# Patient Record
Sex: Female | Born: 1965 | Race: Black or African American | Hispanic: No | Marital: Married | Smoking: Current every day smoker
Health system: Southern US, Community
[De-identification: ages and names within clinical notes are randomized; demographics above are authoritative.]

## PROBLEM LIST (undated history)

## (undated) DIAGNOSIS — G4730 Sleep apnea, unspecified: Secondary | ICD-10-CM

## (undated) DIAGNOSIS — B029 Zoster without complications: Secondary | ICD-10-CM

## (undated) DIAGNOSIS — K219 Gastro-esophageal reflux disease without esophagitis: Secondary | ICD-10-CM

## (undated) DIAGNOSIS — E119 Type 2 diabetes mellitus without complications: Secondary | ICD-10-CM

## (undated) DIAGNOSIS — M1990 Unspecified osteoarthritis, unspecified site: Secondary | ICD-10-CM

## (undated) DIAGNOSIS — E039 Hypothyroidism, unspecified: Secondary | ICD-10-CM

## (undated) DIAGNOSIS — I10 Essential (primary) hypertension: Secondary | ICD-10-CM

## (undated) DIAGNOSIS — Z8719 Personal history of other diseases of the digestive system: Secondary | ICD-10-CM

## (undated) DIAGNOSIS — E785 Hyperlipidemia, unspecified: Secondary | ICD-10-CM

## (undated) DIAGNOSIS — G510 Bell's palsy: Secondary | ICD-10-CM

## (undated) DIAGNOSIS — R0602 Shortness of breath: Secondary | ICD-10-CM

## (undated) HISTORY — DX: Hyperlipidemia, unspecified: E78.5

## (undated) HISTORY — DX: Gastro-esophageal reflux disease without esophagitis: K21.9

## (undated) HISTORY — PX: ABDOMINAL HYSTERECTOMY: SHX81

## (undated) HISTORY — PX: TUBAL LIGATION: SHX77

## (undated) HISTORY — PX: TOOTH EXTRACTION: SUR596

## (undated) HISTORY — DX: Essential (primary) hypertension: I10

## (undated) HISTORY — PX: OTHER SURGICAL HISTORY: SHX169

---

## 1996-08-14 HISTORY — PX: THYROIDECTOMY: SHX17

## 1998-08-14 HISTORY — PX: PARTIAL HYSTERECTOMY: SHX80

## 2005-07-29 ENCOUNTER — Other Ambulatory Visit: Payer: Self-pay

## 2005-07-29 ENCOUNTER — Observation Stay: Payer: Self-pay | Admitting: Cardiovascular Disease

## 2005-08-01 ENCOUNTER — Ambulatory Visit: Payer: Self-pay | Admitting: Cardiovascular Disease

## 2005-09-21 ENCOUNTER — Ambulatory Visit: Payer: Self-pay | Admitting: Unknown Physician Specialty

## 2005-12-14 ENCOUNTER — Ambulatory Visit: Payer: Self-pay | Admitting: Specialist

## 2006-01-10 ENCOUNTER — Ambulatory Visit: Payer: Self-pay | Admitting: Pain Medicine

## 2006-01-16 ENCOUNTER — Ambulatory Visit: Payer: Self-pay | Admitting: Pain Medicine

## 2006-01-19 ENCOUNTER — Encounter: Payer: Self-pay | Admitting: Pain Medicine

## 2006-01-31 ENCOUNTER — Ambulatory Visit: Payer: Self-pay | Admitting: Physician Assistant

## 2006-02-20 ENCOUNTER — Ambulatory Visit: Payer: Self-pay | Admitting: Pain Medicine

## 2006-03-06 ENCOUNTER — Ambulatory Visit: Payer: Self-pay | Admitting: Pain Medicine

## 2006-03-19 ENCOUNTER — Ambulatory Visit: Payer: Self-pay | Admitting: Physician Assistant

## 2006-05-08 ENCOUNTER — Ambulatory Visit: Payer: Self-pay | Admitting: Physician Assistant

## 2006-06-04 ENCOUNTER — Emergency Department: Payer: Self-pay | Admitting: Emergency Medicine

## 2006-06-05 ENCOUNTER — Ambulatory Visit: Payer: Self-pay | Admitting: Physician Assistant

## 2006-06-12 ENCOUNTER — Ambulatory Visit: Payer: Self-pay | Admitting: Pain Medicine

## 2006-07-11 ENCOUNTER — Ambulatory Visit: Payer: Self-pay | Admitting: Physician Assistant

## 2006-08-20 ENCOUNTER — Ambulatory Visit: Payer: Self-pay | Admitting: Physician Assistant

## 2006-09-26 ENCOUNTER — Ambulatory Visit: Payer: Self-pay | Admitting: Physician Assistant

## 2006-10-08 ENCOUNTER — Ambulatory Visit: Payer: Self-pay | Admitting: Pain Medicine

## 2006-10-11 ENCOUNTER — Ambulatory Visit: Payer: Self-pay | Admitting: Pain Medicine

## 2006-11-26 ENCOUNTER — Ambulatory Visit: Payer: Self-pay | Admitting: Pain Medicine

## 2007-01-29 ENCOUNTER — Encounter: Payer: Self-pay | Admitting: Internal Medicine

## 2007-04-07 ENCOUNTER — Emergency Department: Payer: Self-pay | Admitting: Unknown Physician Specialty

## 2007-06-12 ENCOUNTER — Other Ambulatory Visit: Payer: Self-pay

## 2007-06-12 ENCOUNTER — Emergency Department: Payer: Self-pay | Admitting: Unknown Physician Specialty

## 2007-08-03 ENCOUNTER — Emergency Department: Payer: Self-pay | Admitting: Internal Medicine

## 2007-08-03 ENCOUNTER — Other Ambulatory Visit: Payer: Self-pay

## 2007-08-17 ENCOUNTER — Encounter: Admission: RE | Admit: 2007-08-17 | Discharge: 2007-08-17 | Payer: Self-pay | Admitting: Neurosurgery

## 2008-01-14 ENCOUNTER — Ambulatory Visit: Payer: Self-pay | Admitting: Unknown Physician Specialty

## 2008-10-13 ENCOUNTER — Ambulatory Visit: Payer: Self-pay | Admitting: Specialist

## 2008-11-10 ENCOUNTER — Ambulatory Visit: Payer: Self-pay | Admitting: Neurosurgery

## 2008-11-28 ENCOUNTER — Emergency Department: Payer: Self-pay | Admitting: Emergency Medicine

## 2009-12-15 ENCOUNTER — Emergency Department (HOSPITAL_COMMUNITY): Admission: EM | Admit: 2009-12-15 | Discharge: 2009-12-16 | Payer: Self-pay | Admitting: Emergency Medicine

## 2009-12-17 ENCOUNTER — Emergency Department (HOSPITAL_COMMUNITY): Admission: EM | Admit: 2009-12-17 | Discharge: 2009-12-17 | Payer: Self-pay | Admitting: Emergency Medicine

## 2009-12-31 ENCOUNTER — Ambulatory Visit: Payer: Self-pay | Admitting: Family Medicine

## 2009-12-31 ENCOUNTER — Encounter: Payer: Self-pay | Admitting: Family Medicine

## 2009-12-31 DIAGNOSIS — K219 Gastro-esophageal reflux disease without esophagitis: Secondary | ICD-10-CM | POA: Insufficient documentation

## 2009-12-31 DIAGNOSIS — F329 Major depressive disorder, single episode, unspecified: Secondary | ICD-10-CM

## 2009-12-31 DIAGNOSIS — E1169 Type 2 diabetes mellitus with other specified complication: Secondary | ICD-10-CM | POA: Insufficient documentation

## 2009-12-31 DIAGNOSIS — G4733 Obstructive sleep apnea (adult) (pediatric): Secondary | ICD-10-CM | POA: Insufficient documentation

## 2009-12-31 DIAGNOSIS — F3289 Other specified depressive episodes: Secondary | ICD-10-CM | POA: Insufficient documentation

## 2009-12-31 DIAGNOSIS — E7800 Pure hypercholesterolemia, unspecified: Secondary | ICD-10-CM | POA: Insufficient documentation

## 2009-12-31 DIAGNOSIS — G4730 Sleep apnea, unspecified: Secondary | ICD-10-CM | POA: Insufficient documentation

## 2010-01-03 ENCOUNTER — Encounter: Payer: Self-pay | Admitting: Family Medicine

## 2010-01-25 ENCOUNTER — Ambulatory Visit: Payer: Self-pay | Admitting: Family Medicine

## 2010-01-25 DIAGNOSIS — B029 Zoster without complications: Secondary | ICD-10-CM | POA: Insufficient documentation

## 2010-01-25 DIAGNOSIS — I10 Essential (primary) hypertension: Secondary | ICD-10-CM | POA: Insufficient documentation

## 2010-01-25 DIAGNOSIS — E039 Hypothyroidism, unspecified: Secondary | ICD-10-CM | POA: Insufficient documentation

## 2010-04-28 ENCOUNTER — Encounter: Payer: Self-pay | Admitting: Family Medicine

## 2010-04-28 ENCOUNTER — Ambulatory Visit: Payer: Self-pay | Admitting: Family Medicine

## 2010-04-28 DIAGNOSIS — M719 Bursopathy, unspecified: Secondary | ICD-10-CM

## 2010-04-28 DIAGNOSIS — M67919 Unspecified disorder of synovium and tendon, unspecified shoulder: Secondary | ICD-10-CM | POA: Insufficient documentation

## 2010-04-28 LAB — CONVERTED CEMR LAB
BUN: 6 mg/dL (ref 6–23)
CO2: 28 meq/L (ref 19–32)
Calcium: 9.4 mg/dL (ref 8.4–10.5)
Chloride: 104 meq/L (ref 96–112)
Creatinine, Ser: 0.89 mg/dL (ref 0.40–1.20)
Glucose, Bld: 88 mg/dL (ref 70–99)
Potassium: 3.8 meq/L (ref 3.5–5.3)
Sodium: 141 meq/L (ref 135–145)

## 2010-05-24 ENCOUNTER — Encounter: Payer: Self-pay | Admitting: Family Medicine

## 2010-05-24 ENCOUNTER — Ambulatory Visit: Payer: Self-pay | Admitting: Family Medicine

## 2010-05-24 DIAGNOSIS — Z720 Tobacco use: Secondary | ICD-10-CM | POA: Insufficient documentation

## 2010-05-24 DIAGNOSIS — F17200 Nicotine dependence, unspecified, uncomplicated: Secondary | ICD-10-CM

## 2010-05-24 DIAGNOSIS — L259 Unspecified contact dermatitis, unspecified cause: Secondary | ICD-10-CM | POA: Insufficient documentation

## 2010-05-24 LAB — CONVERTED CEMR LAB: TSH: 104.996 microintl units/mL — ABNORMAL HIGH (ref 0.350–4.500)

## 2010-05-27 ENCOUNTER — Ambulatory Visit: Payer: Self-pay | Admitting: Family Medicine

## 2010-05-27 ENCOUNTER — Encounter
Admission: RE | Admit: 2010-05-27 | Discharge: 2010-08-11 | Payer: Self-pay | Source: Home / Self Care | Attending: Physical Medicine & Rehabilitation | Admitting: Physical Medicine & Rehabilitation

## 2010-05-31 ENCOUNTER — Encounter: Payer: Self-pay | Admitting: Family Medicine

## 2010-06-01 ENCOUNTER — Telehealth: Payer: Self-pay | Admitting: *Deleted

## 2010-06-02 ENCOUNTER — Encounter
Admission: RE | Admit: 2010-06-02 | Discharge: 2010-07-19 | Payer: Self-pay | Source: Home / Self Care | Admitting: Family Medicine

## 2010-06-03 ENCOUNTER — Ambulatory Visit: Payer: Self-pay | Admitting: Physical Medicine & Rehabilitation

## 2010-06-06 ENCOUNTER — Encounter: Payer: Self-pay | Admitting: Family Medicine

## 2010-06-06 ENCOUNTER — Ambulatory Visit: Payer: Self-pay | Admitting: Family Medicine

## 2010-06-06 LAB — CONVERTED CEMR LAB
Free T4: 0.6 ng/dL — ABNORMAL LOW (ref 0.80–1.80)
TSH: 57.801 microintl units/mL — ABNORMAL HIGH (ref 0.350–4.500)

## 2010-07-05 ENCOUNTER — Ambulatory Visit: Payer: Self-pay | Admitting: Physical Medicine & Rehabilitation

## 2010-07-19 ENCOUNTER — Encounter: Payer: Self-pay | Admitting: Family Medicine

## 2010-09-08 ENCOUNTER — Encounter
Admission: RE | Admit: 2010-09-08 | Discharge: 2010-09-13 | Payer: Self-pay | Source: Home / Self Care | Attending: Physical Medicine & Rehabilitation | Admitting: Physical Medicine & Rehabilitation

## 2010-09-12 ENCOUNTER — Ambulatory Visit: Admit: 2010-09-12 | Payer: Self-pay | Admitting: Physical Medicine & Rehabilitation

## 2010-09-13 NOTE — Assessment & Plan Note (Signed)
Summary: f/u meds,df   Vital Signs:  Patient profile:   45 year old female Height:      63 inches Weight:      254 pounds BMI:     45.16 Temp:     98.2 degrees F oral Pulse rate:   64 / minute BP sitting:   110 / 74  (right arm) Cuff size:   large  Vitals Entered By: Tessie Fass CMA (January 25, 2010 4:25 PM) CC: F/U meds Is Patient Diabetic? No   CC:  F/U meds.  History of Present Illness: depression/anxiety- doesn't think fluoxetine is working. has been picking at her hands. would like to go back on wellbutrin.   hypothyroidism- has been out of synthroid for several weeks. denies cold intolerance, weight gain, etc.  HTN- has been out of meds for at least a week. denies chest pain, SOB, headaches, blurred vision.  chronic back pain- previously on vicodin. states they "stopped working." not currently taking anything.   candidiasis- has been using topical antifungal under R axilla with minimal resolution of symptoms.   shingles- pain has resolved.   Habits & Providers  Alcohol-Tobacco-Diet     Tobacco Status: current     Tobacco Counseling: to quit use of tobacco products     Cigarette Packs/Day: 1.0  Current Medications (verified): 1)  Fluoxetine Hcl 20 Mg Caps (Fluoxetine Hcl) .... Take One Capsule in The Morning 2)  Synthroid 200 Mcg Tabs (Levothyroxine Sodium) .... Take 1 Tablet Once A Day 3)  Triamterene-Hctz 37.5-25 Mg Tabs (Triamterene-Hctz) .... Take One Tablet Once A Day 4)  Enalapril Maleate 20 Mg Tabs (Enalapril Maleate) .... Take 1 Tablet Once A Day 5)  Hydroxyzine Hcl 25 Mg Tabs (Hydroxyzine Hcl) .... Take One Tablet Once A Day 6)  Budeprion Sr 100 Mg Xr12h-Tab (Bupropion Hcl) .... Take One Pill Once A Day 7)  Pravastatin Sodium 40 Mg Tabs (Pravastatin Sodium) .Marland Kitchen.. 1 Tablet Once A Day  Allergies (verified): No Known Drug Allergies  Past History:  Past Medical History: HTN chronic back pain hypothyroidism anxiety/depression  Physical  Exam  General:  obese female, NAD. vitals reviewed.  Mouth:  MMM Skin:  erythema and maceration of skin under R axilla. healing ulcerative lesions on posterior aspect of R arm.  Psych:  memory intact for recent and remote, normally interactive, and not anxious appearing.     Impression & Recommendations:  Problem # 1:  SHINGLES (ICD-053.9) Assessment Improved  no further tx necessary. lesions healing nicely.  Orders: [facility]- Est  Level 4 (99214)  Problem # 2:  CANDIDIASIS, SKIN (ICD-112.3) Assessment: Improved  2 days of fluconazole.   Orders: [facility]- Est  Level 4 (99214)  Problem # 3:  BACK PAIN, CHRONIC (ICD-724.5) Assessment: Comment Only recommend discuss PT at next visit.   Problem # 4:  HYPOTHYROIDISM (ICD-244.9) Assessment: Comment Only check TSH at next visit. refill sent.   Her updated medication list for this problem includes:    Synthroid 200 Mcg Tabs (Levothyroxine sodium) .Marland Kitchen... Take 1 tablet once a day  Problem # 5:  HYPERTENSION, BENIGN ESSENTIAL (ICD-401.1) Assessment: Improved  BP normal off meds. will d/c enalapril. patient wants to continue triamterene-HCTZ. counseled to be cognizant of any lightheadedness, dizzyness, etc. as that may mean her BP is too low. would check CMP next visit.   The following medications were removed from the medication list:    Enalapril Maleate 20 Mg Tabs (Enalapril maleate) .Marland Kitchen... Take 1 tablet once a day Her  updated medication list for this problem includes:    Triamterene-hctz 37.5-25 Mg Tabs (Triamterene-hctz) .Marland Kitchen... Take one tablet once a day  Orders: [facility]- Est  Level 4 (99214)  Problem # 6:  HYPERCHOLESTEROLEMIA (ICD-272.0) Assessment: Comment Only check lipids next visit.   Her updated medication list for this problem includes:    Pravastatin Sodium 40 Mg Tabs (Pravastatin sodium) .Marland Kitchen... 1 tablet once a day  Problem # 7:  DEPRESSION (ICD-311) Assessment: Deteriorated  given cost concerns, will try increased dose of  fluoxetine. if no improvement, patient would like to change to wellbutrin.   Her updated medication list for this problem includes:    Fluoxetine Hcl 40 Mg Caps (Fluoxetine hcl) ..... One tab by mouth daily    Hydroxyzine Hcl 25 Mg Tabs (Hydroxyzine hcl) .Marland Kitchen... Take one tablet once a day    Budeprion Sr 100 Mg Xr12h-tab (Bupropion hcl) .Marland Kitchen... Take one pill once a day  Orders: [facility]- Est  Level 4 (14782)  Patient Instructions: 1)  Follow up in 3 months for blood pressure and thyroid. 2)  Stop enalapril Prescriptions: FLUCONAZOLE 150 MG TABS (FLUCONAZOLE) one tab by mouth by mouth daily x1 day, repeat in 2 days.  #2 x 0   Entered and Authorized by:   Lequita Asal  MD   Signed by:   Lequita Asal  MD on 01/25/2010   Method used:   Electronically to        [facility] (retail)       [address]       [place], [place]  [id number]       Ph: [phone]       Fax: [phone]   RxID:   [id number] ENALAPRIL MALEATE 20 MG TABS (ENALAPRIL MALEATE) take 1 tablet once a day  #30 x 4   Entered and Authorized by:   Lequita Asal  MD   Signed by:   Lequita Asal  MD on 01/25/2010   Method used:   Electronically to        [facility] (retail)       [address]       [place], [place]  [id number]       Ph: [phone]       Fax: [phone]   RxID:   [id number] TRIAMTERENE-HCTZ 37.5-25 MG TABS (TRIAMTERENE-HCTZ) take one tablet once a day  #30 x 4   Entered and Authorized by:   Lequita Asal  MD   Signed by:   Lequita Asal  MD on 01/25/2010   Method used:   Electronically to        [facility] (retail)       [address]       [place], [place]  [address]       Ph: [phone]       Fax: [phone]   RxID:   [id number] SYNTHROID 200 MCG TABS (LEVOTHYROXINE SODIUM) take 1 tablet once a day  #30 x 4   Entered and Authorized by:   Lequita Asal  MD   Signed by:   Lequita Asal  MD on 01/25/2010   Method used:   Electronically to        [facility] (retail)       [address]       [place], [place]  [id number]       Ph: [phone]       Fax:  [phone]   RxID:   [id number] FLUOXETINE HCL 40 MG CAPS (FLUOXETINE HCL) one tab by mouth daily  #30 x 4   Entered and Authorized by:   Lequita Asal  MD   Signed by:   Lequita Asal  MD on 01/25/2010   Method used:   Electronically to        [facility] (retail)       [address]       [place], [place]  [id number]       Ph: [phone]       Fax: [phone]   RxID:   [id number]    Prevention & Chronic Care Immunizations   Influenza vaccine: Not documented    Tetanus booster: Not documented    Pneumococcal vaccine: Not documented  Other Screening   Pap smear: Not documented    Mammogram: Not documented   Smoking status: current  (01/25/2010)  Lipids   Total Cholesterol: Not documented   LDL: Not documented   LDL Direct: Not documented   HDL: Not documented   Triglycerides: Not documented    SGOT (AST): Not documented   SGPT (ALT): Not documented   Alkaline phosphatase: Not documented   Total bilirubin: Not documented  Hypertension   Last Blood Pressure: 110 / 74  (01/25/2010)   Serum creatinine: Not documented   Serum potassium Not documented    Hypertension flowsheet reviewed?: Yes   Progress toward BP goal: At goal  Self-Management Support :   Personal Goals (by the next clinic visit) :      Personal blood pressure goal: 140/90  (01/25/2010)     Personal LDL goal: 130  (01/25/2010)    Hypertension self-management support: Not documented    Hypertension self-management support not done because: Good outcomes  (01/25/2010)    Lipid self-management support: Not documented

## 2010-09-13 NOTE — Miscellaneous (Signed)
Summary: ROI  ROI   Imported By: Clydell Hakim 01/14/2010 15:22:39  _____________________________________________________________________  External Attachment:    Type:   Image     Comment:   External Document

## 2010-09-13 NOTE — Progress Notes (Signed)
Summary: phone note/ pt to make appt for labs  Phone Note Outgoing Call   Call placed by: Loralee Pacas CMA,  June 01, 2010 12:00 PM Summary of Call: called to inform pt that dr. Rivka Safer wanted her to come in and have some labwork done left message with her husband Fayrene Fearing and he will have her to call back  Follow-up for Phone Call        pt called and made appt for Monday for labs Follow-up by: De Nurse,  June 01, 2010 2:27 PM

## 2010-09-13 NOTE — Assessment & Plan Note (Signed)
Summary: back pain,df   Vital Signs:  Patient profile:   45 year old female Height:      63 inches Weight:      256 pounds BMI:     45.51 BSA:     2.15 Temp:     98.1 degrees F Pulse rate:   61 / minute BP sitting:   146 / 98  Vitals Entered By: Jone Baseman CMA (April 28, 2010 10:29 AM) CC: Back pain x 1 week Is Patient Diabetic? No Pain Assessment Patient in pain? yes     Location: back Intensity: 8   Primary Care Provider:  Edd Arbour  CC:  Back pain x 1 week.  History of Present Illness: 45 yo F:  1. Shoulder Pain: was reaching behind her back yesterday to hook bra and felt pain in shoulder. now having pain with abduction of arm. no other or previous injury. Rx Tylenol not helping.  2. Back Pain: acute flare on chronic issue. endorses lumbar disk bulging from previous MRI. was followed by Pain Clinic before she moved. 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 she cannot pinpoint a specific episode. she endorses low back pain with intermittent radiation down both legs, she also endorses bilateral leg numbness/tingling when she sits for too long. she denies bowel/bladder incontinence, FamHx of OP, prolonged use of corticosteroids. she states that she cannot take NDAIDs 2/2 irritable bowel syndrome (not documented yet). she tried Ultram previously with no relief.   3. HTN: on no medications. previously on Enalapril but was told to stop after last visit 2/2 normal BP off meds. She denies HA, dizziness, CP, SOB, LE edema.  Habits & Providers  Alcohol-Tobacco-Diet     Tobacco Status: current     Tobacco Counseling: to quit use of tobacco products     Cigarette Packs/Day: 0.25  Current Medications (verified): 1)  Fluoxetine Hcl 40 Mg Caps (Fluoxetine Hcl) .... One Tab By Mouth Daily 2)  Synthroid 200 Mcg Tabs (Levothyroxine Sodium) .... Take 1 Tablet Once A Day 3)  Hydroxyzine Hcl 25 Mg Tabs (Hydroxyzine Hcl) .... Take One Tablet  Once A Day 4)  Budeprion Sr 100 Mg Xr12h-Tab (Bupropion Hcl) .... Take One Pill Once A Day 5)  Pravastatin Sodium 40 Mg Tabs (Pravastatin Sodium) .Marland Kitchen.. 1 Tablet Once A Day 6)  Fluconazole 150 Mg Tabs (Fluconazole) .... One Tab By Mouth By Mouth Daily X1 Day, Repeat in 2 Days. 7)  Vasotec 20 Mg Tabs (Enalapril Maleate) .... One By Mouth Daily 8)  Hydrocodone-Acetaminophen 5-325 Mg Tabs (Hydrocodone-Acetaminophen) .Marland Kitchen.. 1 By Mouth Up To 4 Times Per Day As Needed For Pain  Allergies (verified): No Known Drug Allergies  Social History: Packs/Day:  0.25  Review of Systems      See HPI  Physical Exam  General:  Obese female, NAD. Vitals reviewed.  Lungs:  Normal respiratory effort, chest expands symmetrically. Lungs are clear to auscultation, no crackles or wheezes. Heart:  Normal rate and regular rhythm. S1 and S2 normal without gallop, murmur, click, rub or other extra sounds. Msk:  Back: No obvious abdnomalities or skin changes, TTP along lumbar spine, no TTP along paraspinal mm, decreased fexion and extension 2/2 endorsed pain, no SI TTP, no hip TTP, neg SLR bilaterally, normal LE strength and reflexes.  Right Shoulder: No obvious abnormalities or skin changes, TTP along anterior superior shoulder, with decreased abduction and external rotation 2/2 endorsed pain. Pain on right with "empty can"  test. Pulses:  R and L radial, orsalis pedis and posterior tibial pulses are full and equal bilaterally. Extremities:  No edema. Skin:  Intact without suspicious lesions or rashes. Psych:  Oriented X3, memory intact for recent and remote, normally interactive, good eye contact, not anxious appearing, and not depressed appearing.     Impression & Recommendations:  Problem # 1:  ROTATOR CUFF SYNDROME, RIGHT (ICD-726.10) Assessment New No Red Flags. PT Referral. Limited amount of Rx Vicodin given for acute pain (also see below). May use ice as well. Advised to follow up in 4-6 weeks if not  improving. Orders: Physical Therapy Referral (PT) [facility]- Est  Level 4 (40981)  Problem # 2:  BACK PAIN, CHRONIC (ICD-724.5) Assessment: Deteriorated Acute Flare on Chronic Issue. Recommended that patient become established with another pain clinic in [place]. She is also amenable to PT for the back. Limited supply of Rx Vicodin until patient seen at Pain Center. Orders: Physical Therapy Referral (PT) Pain Clinic Referral (Pain) [facility]- Est  Level 4 (19147)  Her updated medication list for this problem includes:    Hydrocodone-acetaminophen 5-325 Mg Tabs (Hydrocodone-acetaminophen) .Marland Kitchen... 1 by mouth up to 4 times per day as needed for pain  Problem # 3:  HYPERTENSION, BENIGN ESSENTIAL (ICD-401.1) Assessment: Deteriorated Restart Enalapril. Check BMP. The following medications were removed from the medication list:    Triamterene-hctz 37.5-25 Mg Tabs (Triamterene-hctz) .Marland Kitchen... Take one tablet once a day Her updated medication list for this problem includes:    Vasotec 20 Mg Tabs (Enalapril maleate) ..... One by mouth daily  Orders: Basic Met-[facility] ([id number]) [facility]- Est  Level 4 (65784)  Complete Medication List: 1)  Fluoxetine Hcl 40 Mg Caps (Fluoxetine hcl) .... One tab by mouth daily 2)  Synthroid 200 Mcg Tabs (Levothyroxine sodium) .... Take 1 tablet once a day 3)  Hydroxyzine Hcl 25 Mg Tabs (Hydroxyzine hcl) .... Take one tablet once a day 4)  Budeprion Sr 100 Mg Xr12h-tab (Bupropion hcl) .... Take one pill once a day 5)  Pravastatin Sodium 40 Mg Tabs (Pravastatin sodium) .Marland Kitchen.. 1 tablet once a day 6)  Fluconazole 150 Mg Tabs (Fluconazole) .... One tab by mouth by mouth daily x1 day, repeat in 2 days. 7)  Vasotec 20 Mg Tabs (Enalapril maleate) .... One by mouth daily 8)  Hydrocodone-acetaminophen 5-325 Mg Tabs (Hydrocodone-acetaminophen) .Marland Kitchen.. 1 by mouth up to 4 times per day as needed for pain  Patient Instructions: 1)  It was nice to see you today! 2)  We will call with your  referrals. 3)  Follow up in 4-6 weeks if you are not improving. Prescriptions: HYDROCODONE-ACETAMINOPHEN 5-325 MG TABS (HYDROCODONE-ACETAMINOPHEN) 1 by mouth up to 4 times per day as needed for pain  #30 x 0   Entered and Authorized by:   Helane Rima DO   Signed by:   Helane Rima DO on 04/28/2010   Method used:   Print then Give to Patient   RxID:   [id number] VASOTEC 20 MG TABS (ENALAPRIL MALEATE) one by mouth daily  #30 x 0   Entered and Authorized by:   Helane Rima DO   Signed by:   Helane Rima DO on 04/28/2010   Method used:   Print then Give to Patient   RxID:   [id number]   Prevention & Chronic Care Immunizations   Influenza vaccine: Not documented   Influenza vaccine deferral: Deferred  (04/28/2010)    Tetanus booster: Not documented    Pneumococcal vaccine: Not documented  Other Screening   Pap smear: Not documented    Mammogram: Not documented   Smoking status: current  (04/28/2010)   Smoking cessation counseling: yes  (04/28/2010)  Lipids   Total Cholesterol: Not documented   LDL: Not documented   LDL Direct: Not documented   HDL: Not documented   Triglycerides: Not documented    SGOT (AST): Not documented   SGPT (ALT): Not documented   Alkaline phosphatase: Not documented   Total bilirubin: Not documented    Lipid flowsheet reviewed?: Yes   Progress toward LDL goal: Unchanged  Hypertension   Last Blood Pressure: 146 / 98  (04/28/2010)   Serum creatinine: Not documented   Serum potassium Not documented    Hypertension flowsheet reviewed?: Yes   Progress toward BP goal: Deteriorated  Self-Management Support :   Personal Goals (by the next clinic visit) :      Personal blood pressure goal: 140/90  (01/25/2010)     Personal LDL goal: 130  (01/25/2010)    Patient will work on the following items until the next clinic visit to reach self-care goals:     Medications and monitoring: take my medicines every day, bring all of my  medications to every visit  (04/28/2010)     Eating: drink diet soda or water instead of juice or soda, eat more vegetables, use fresh or frozen vegetables, eat foods that are low in salt, eat baked foods instead of fried foods, eat fruit for snacks and desserts, limit or avoid alcohol  (04/28/2010)     Activity: take a 30 minute walk every day, take the stairs instead of the elevator, park at the far end of the parking lot  (04/28/2010)    Hypertension self-management support: Written self-care plan  (04/28/2010)   Hypertension self-care plan printed.    Hypertension self-management support not done because: Good outcomes  (01/25/2010)    Lipid self-management support: Written self-care plan  (04/28/2010)   Lipid self-care plan printed.

## 2010-09-13 NOTE — Assessment & Plan Note (Signed)
Summary: Smoking Cessation - Rx Clinic   Vital Signs:  Patient profile:   45 year old female Height:      63 inches Weight:      262.6 pounds BMI:     46.69 BP sitting:   142 / 82  (left arm)  Primary Care Provider:  Edd Arbour   History of Present Illness: Arrives today in good spirits and ready to discuss smoking cessation. The patient began smoking in her late 45s in order to "lose weight" and reports smoking 1 ppd for several years before cutting back to 1/2 ppd and now currently to 3-5 cigarettes per day. Denies ever waking up to smoke, but does admit to smoking immediately upon rising. Although she does not think cigarettes "taste good" anymore, current stressors include driving, eating, and stress.   Chronic back pain, which has left her on disability, is contributing somewhat to increased levels of stress. MH receives her disability check on the 3rd Wednesday of each month and because of this has been unable to get her medications including bupropion refilled over the past few weeks.  She plans to buy medicines next week.   Habits & Providers  Alcohol-Tobacco-Diet     Tobacco Status: current     Tobacco Counseling: Quit for 2 months approximately 2 years ago but restarted when her father passed away.      Year Started: 1996     Pack years: 7.50  Allergies: No Known Drug Allergies   Impression & Recommendations:  Problem # 1:  TOBACCO ABUSE (ICD-305.1) Assessment Improved  Moderate Nicotine Abuse of 15 years duration in a patient who is an excellent candidate for success b/c of strong willingness and readiness to quit. Patient is aware of the health consequences of smoking and has several motivating factors to quit, including sleep apnea, poor health, and encouragement from her husband. Patient has gradually decreased nicotine intake over the past several months from nearly 1 ppd to now only 3-5 cigarettes per day. Over the next month, the patient agreed to further  reduce cigarette use to only 3 cigarettes per day or less. Patient was counseled on how to avoid triggers and stressful situations, including chewing gum or eating perppermints when driving, taking different routes, and doing crossword puzzles when stressed or upset. MH was also encouraged to drink more water and crystal light than coolaid or pepsi and to choose more fruits and vegetables than sweet snacks in order to avoid excessive weight gain associated with smoking cessation. The patient has set a quit date of 06/29/10 with a plan to reduce smoking to 3 cigarettes per day or less by then and transition to use of nicotine gum on quit date with a goal of using the gum for 1 month and ultimately be nicotine free by 07/29/10 ( her B-day).  TTFFC: 35 minutes.  Seen with Sheliah Mends PharmD resident and Harrel Carina, PharmD candidate.   Written information provided.  F/U Rx Clinic Visit: 3-4 weeks   Total time with patient in face-to-face counseling:  30  minutes.  Patient seen with: Harrel Carina, PharmD candidate, Geoffry Paradise, PharmD, and Madelon Lips, PharmD.   Orders: Inital Assessment Each - [facility] [phone])  Complete Medication List: 1)  Fluoxetine Hcl 40 Mg Caps (Fluoxetine hcl) .... One tab by mouth daily 2)  Synthroid 200 Mcg Tabs (Levothyroxine sodium) .... Take 1 tablet once a day 3)  Hydroxyzine Hcl 25 Mg Tabs (Hydroxyzine hcl) .... Take one tablet once a day  4)  Budeprion Sr 100 Mg Xr12h-tab (Bupropion hcl) .... Take one pill once a day 5)  Pravastatin Sodium 40 Mg Tabs (Pravastatin sodium) .Marland Kitchen.. 1 tablet once a day 6)  Fluconazole 150 Mg Tabs (Fluconazole) .... One tab by mouth by mouth daily x1 day, repeat in 2 days. 7)  Vasotec 20 Mg Tabs (Enalapril maleate) .... One by mouth daily 8)  Hydrocodone-acetaminophen 5-325 Mg Tabs (Hydrocodone-acetaminophen) .Marland Kitchen.. 1 by mouth up to 4 times per day as needed for pain  Patient Instructions: 1)  Smoke 3 cigarettes per day OR LESS.  2)   Drink more water or crystal light.  3)  Return to Rx Clinic in 3-4 weeks (Before November 15th).  4)  Eat healthy and increase your exercise as tolerated.  Tobacco Counseling   Currently uses tobacco.    Cigarettes      Year started smoking cigarettes:      1996     Number of packs per day smoked:      0.5     Years smoked:            15     Packs per year:          182.50     Pack Years:            7.50  Counseled to quit/cut down on tobacco use.   Readiness to Quit:     Somewhat Ready Cessation Stage:     Action Target Quit Date:     06/29/2010  Quitting Barriers:      -  stress     -  anxiety     -  depression  Quitting Motivators:      -  poor health     -  sleep apnea     -  family pressure     -  healthier lifestyle  Comments: Brand smoked: [employer]    Nicotine Content per Cigarette (mg): 0.5-0.8 Estimated Nicotine intake per day: 2-3  mg.  Smokes first cigarette:   immediately after waking. Denies waking to smoke.  Smokes:  0  times per night.   Estimated Fagerstrom Score: Patient reports IMPORTANCE  to quit on 1-10 scale of: 10  Patient reports READINESS to quit on a scale of 0-10 of: 10   Patient reports CONFIDENCE in potential to quit on scale of 0-10 of: 10   . :    Previous Quit Attempts   Previously Tried to quit:     Yes # of Previous quit attempts:     1 Longest Successful Quit Period:   2 months  Quit Methods Tried:      -  gradual taper  Reason for restarting:      -  stress  Comments: Quit for 2 months approximately 2 years ago but restarted when her father passed away.   Smoking Cessation Plan   Readiness:     Somewhat Ready Cessation Stage:   Action Target Quit Date:   06/29/2010  Counseled on:      -  environment change     -  routine/lifestyle  change     -  stress avoidance     -  nicotine withdrawal symptoms  Orders Added this Update:  Inital Assessment Each - [facility] [id number]  Prevention & Chronic  Care Immunizations   Influenza vaccine: Not documented   Influenza vaccine deferral: Deferred  (04/28/2010)    Tetanus booster: Not documented    Pneumococcal  vaccine: Not documented  Other Screening   Pap smear: Not documented    Mammogram: Not documented   Smoking status: current  (05/27/2010)   Smoking cessation counseling: yes  (05/27/2010)   Target quit date: 06/29/2010  (05/27/2010)  Lipids   Total Cholesterol: Not documented   LDL: Not documented   LDL Direct: Not documented   HDL: Not documented   Triglycerides: Not documented    SGOT (AST): Not documented   SGPT (ALT): Not documented   Alkaline phosphatase: Not documented   Total bilirubin: Not documented    Lipid flowsheet reviewed?: Yes  Hypertension   Last Blood Pressure: 142 / 82  (05/27/2010)   Serum creatinine: 0.89  (04/28/2010)   Serum potassium 3.8  (04/28/2010)    Hypertension flowsheet reviewed?: Yes   Progress toward BP goal: Unchanged  Self-Management Support :   Personal Goals (by the next clinic visit) :      Personal blood pressure goal: 140/90  (01/25/2010)     Personal LDL goal: 130  (01/25/2010)    Hypertension self-management support: Written self-care plan  (04/28/2010)    Hypertension self-management support not done because: Good outcomes  (01/25/2010)    Lipid self-management support: Written self-care plan  (04/28/2010)

## 2010-09-13 NOTE — Miscellaneous (Signed)
Summary: Orders Update  Clinical Lists Changes  Orders: Added new Test order of TSH-[facility] [phone]) - Signed Added new Test order of Free T4-[facility] [phone]) - Signed Added new Test order of Free T3-[facility] [phone]) - Signed

## 2010-09-13 NOTE — Assessment & Plan Note (Signed)
Summary: f/u thyroid,df   Vital Signs:  Patient profile:   45 year old female Weight:      261 pounds Temp:     98.1 degrees F oral Pulse rate:   66 / minute Pulse rhythm:   regular BP sitting:   154 / 103  (left arm) Cuff size:   large  Vitals Entered By: Loralee Pacas CMA (May 24, 2010 1:34 PM) CC: follow-up visit, thyroid Pain Assessment Patient in pain? yes     Location: back Intensity: 10 Type: heaviness Onset of pain  With activity Comments pt states that the vicodin is not working would like something else.  she has appt with the pain clinic on 10.21.2011. also she has a place on her right hand that she picks at constantly.   Primary Provider:  Edd Arbour  CC:  follow-up visit and thyroid.  History of Present Illness: Pt. is a 45 y/o aaf with obesity who presents with complaints of chronic back pain, right hand rash, questions of thyroid medication, HTN, and tobacco abuse. She was advised to lose weight with diet and exercise. This will ultimately be necessary for relief and healing of her back pain. Her chronic pain will be addressed at a pain management clinic, which she says she has a new appointment with on the 21st of this month. She says she takes 3 vicodin a day without much relief. 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  Her blood pressure was elevated 2ndary to her pain, which improved with rest and was rechecked and 140/90 from 140/100.  She is hypothyroid from thyroid surgery, she takes 200 micrograms of synthroid. She has not had a TSH checked. her surgery was in 1997? She will have a TSH drawn today. Her Tobacco abuse is down to one pack every 2 days. I advised her for 15 minutes about quiting cigarette smoking. She will be referred to Dr. Macky Lower smoking cessasation clinic.   Habits & Providers  Alcohol-Tobacco-Diet     Tobacco Status: current     Tobacco Counseling: to quit use of  tobacco products     Cigarette Packs/Day: 0.5  Problems Prior to Update: 1)  Back Pain, Chronic  (ICD-724.5) 2)  Rotator Cuff Syndrome, Right  (ICD-726.10) 3)  Shingles  (ICD-053.9) 4)  Candidiasis, Skin  (ICD-112.3) 5)  Back Pain, Chronic  (ICD-724.5) 6)  Hypothyroidism  (ICD-244.9) 7)  Hypertension, Benign Essential  (ICD-401.1) 8)  Sleep Apnea  (ICD-780.57) 9)  Hypercholesterolemia  (ICD-272.0) 10)  Gerd  (ICD-530.81) 11)  Depression  (ICD-311)  Medications Prior to Update: 1)  Fluoxetine Hcl 40 Mg Caps (Fluoxetine Hcl) .... One Tab By Mouth Daily 2)  Synthroid 200 Mcg Tabs (Levothyroxine Sodium) .... Take 1 Tablet Once A Day 3)  Hydroxyzine Hcl 25 Mg Tabs (Hydroxyzine Hcl) .... Take One Tablet Once A Day 4)  Budeprion Sr 100 Mg Xr12h-Tab (Bupropion Hcl) .... Take One Pill Once A Day 5)  Pravastatin Sodium 40 Mg Tabs (Pravastatin Sodium) .Marland Kitchen.. 1 Tablet Once A Day 6)  Fluconazole 150 Mg Tabs (Fluconazole) .... One Tab By Mouth By Mouth Daily X1 Day, Repeat in 2 Days. 7)  Vasotec 20 Mg Tabs (Enalapril Maleate) .... One By Mouth Daily 8)  Hydrocodone-Acetaminophen 5-325 Mg Tabs (Hydrocodone-Acetaminophen) .Marland Kitchen.. 1 By Mouth Up To 4 Times Per Day As Needed For Pain  Current Medications (verified): 1)  Fluoxetine Hcl 40 Mg Caps (Fluoxetine Hcl) .... One  Tab By Mouth Daily 2)  Synthroid 200 Mcg Tabs (Levothyroxine Sodium) .... Take 1 Tablet Once A Day 3)  Hydroxyzine Hcl 25 Mg Tabs (Hydroxyzine Hcl) .... Take One Tablet Once A Day 4)  Budeprion Sr 100 Mg Xr12h-Tab (Bupropion Hcl) .... Take One Pill Once A Day 5)  Pravastatin Sodium 40 Mg Tabs (Pravastatin Sodium) .Marland Kitchen.. 1 Tablet Once A Day 6)  Fluconazole 150 Mg Tabs (Fluconazole) .... One Tab By Mouth By Mouth Daily X1 Day, Repeat in 2 Days. 7)  Vasotec 20 Mg Tabs (Enalapril Maleate) .... One By Mouth Daily 8)  Hydrocodone-Acetaminophen 5-325 Mg Tabs (Hydrocodone-Acetaminophen) .Marland Kitchen.. 1 By Mouth Up To 4 Times Per Day As Needed For  Pain  Allergies (verified): No Known Drug Allergies  Past History:  Past Medical History: Last updated: 01/25/2010 HTN chronic back pain hypothyroidism anxiety/depression  Past Surgical History: Last updated: January 24, 2010 thyroidectomy 2000 C-section (1983, 1990, 1993) hysterectomy (2001) tubal ligation (1993)  Family History: Last updated: 2010/01/24 mother deceased at age 17 father deceased at age 24 brother with aortic aneurysm father with heart disease sister with diabetes brother with stroke sudden death in mother  Social History: Last updated: 24-Jan-2010 lives with husband Fayrene Fearing and 2 sons, Margaretha Sheffield and Mecca.  No pets.  Eats a heathy diet.  unemployed enjoys Therapist, art, movies.  obtained GED   Current Smoker-started in 1994, 1 ppd Alcohol use-no Regular exercise-no No recreational drug use Occasional sun exposure  Risk Factors: Exercise: no (24-Jan-2010)  Risk Factors: Smoking Status: current (05/24/2010) Packs/Day: 0.5 (05/24/2010)  Family History: Reviewed history from 2010-01-24 and no changes required. mother deceased at age 5 father deceased at age 56 brother with aortic aneurysm father with heart disease sister with diabetes brother with stroke sudden death in mother  Social History: Reviewed history from 24-Jan-2010 and no changes required. lives with husband Fayrene Fearing and 2 sons, Margaretha Sheffield and Allyn.  No pets.  Eats a heathy diet.  unemployed enjoys Therapist, art, movies.  obtained GED   Current Smoker-started in 1994, 1 ppd Alcohol use-no Regular exercise-no No recreational drug use Occasional sun exposurePacks/Day:  0.5  Review of Systems       The patient complains of weight gain.         Back pain. Anxiety.  Physical Exam  General:  alert, well-developed, and overweight-appearing.   Head:  normocephalic and atraumatic.   Eyes:  vision grossly intact, pupils equal, pupils round, and pupils reactive to  light.   Ears:  R ear normal and L ear normal.   Nose:  no external deformity.   Mouth:  teeth missing.   Neck:  supple.   Chest Wall:  no deformities and no tenderness.   Lungs:  normal respiratory effort and normal breath sounds.   Heart:  normal rate and regular rhythm.   Abdomen:  soft, non-tender, and no distention.   Pulses:  R radial normal.   Extremities:  No clubbing, cyanosis, edema, or deformity noted with normal full range of motion of all joints.     Impression & Recommendations:  Problem # 1:  THYROID DISEASE, HX OF (ICD-V12.2) She had a Total thyroidectomy in 1991 - per patient. she takes 200 micrograms of synthroid daily. we will check a TSH to confirm proper dosing of synthroid.  Orders: [facility] ([address])  Problem # 2:  TOBACCO ABUSE (ICD-305.1) She will be seen by Dr. Raymondo Band. she was advised to quit, she expressed interest.  Orders: [facility]- Est Level  3 (  [address])  Problem # 3:  BACK PAIN, CHRONIC (ICD-724.5) She will see pain management on the 21st of Oct. Gave her a Vicodin refill to last till that time. She was advised to lose weight to improve back pain. She will be sent a letter concerning applying for a back brace for pain relief at home.  Her updated medication list for this problem includes:    Hydrocodone-acetaminophen 5-325 Mg Tabs (Hydrocodone-acetaminophen) .Marland Kitchen... 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 She will be given lotions and was advised to wrap the area to avoid scatching.  Orders: [facility]- Est Level  3 ([address])  Problem # 5:  HYPERTENSION, BENIGN ESSENTIAL (ICD-401.1) Continue current medications, no diabetes, rechecked pressure was 140/90 Her updated medication list for this problem includes:    Vasotec 20 Mg Tabs (Enalapril maleate) ..... One by mouth daily  Orders: [facility]-  Est Level  3 ([address])  Complete Medication List: 1)  Fluoxetine Hcl 40 Mg Caps (Fluoxetine hcl) .... One tab by mouth daily 2)  Synthroid 200 Mcg Tabs (Levothyroxine sodium) .... Take 1 tablet once a day 3)  Hydroxyzine Hcl 25 Mg Tabs (Hydroxyzine hcl) .... Take one tablet once a day 4)  Budeprion Sr 100 Mg Xr12h-tab (Bupropion hcl) .... Take one pill once a day 5)  Pravastatin Sodium 40 Mg Tabs (Pravastatin sodium) .Marland Kitchen.. 1 tablet once a day 6)  Fluconazole 150 Mg Tabs (Fluconazole) .... One tab by mouth by mouth daily x1 day, repeat in 2 days. 7)  Vasotec 20 Mg Tabs (Enalapril maleate) .... One by mouth daily 8)  Hydrocodone-acetaminophen 5-325 Mg Tabs (Hydrocodone-acetaminophen) .Marland Kitchen.. 1 by mouth up to 4 times per day as needed for pain  Patient Instructions: 1)  it was great meeting you today.  2)  Please schedule a follow-up appointment in 6 months. 3)  I will notify you when your thyroid labs come back. Prescriptions: HYDROCODONE-ACETAMINOPHEN 5-325 MG TABS (HYDROCODONE-ACETAMINOPHEN) 1 by mouth up to 4 times per day as needed for pain  #30 x 0   Entered and Authorized by:   Edd Arbour   Signed by:   Edd Arbour on 05/24/2010   Method used:   Print then Give to Patient   RxID:   [id number] HYDROCODONE-ACETAMINOPHEN 5-325 MG TABS (HYDROCODONE-ACETAMINOPHEN) 1 by mouth up to 4 times per day as needed for pain  #30 x 0   Entered and Authorized by:   Edd Arbour   Signed by:   Edd Arbour on 05/24/2010   Method used:   Print then Give to Patient   RxID:   [id number]

## 2010-09-13 NOTE — Letter (Signed)
Summary: Generic Letter  [facility]  [address]   [place], [place] [address]   Phone: [phone]  Fax: [phone]    01/03/2010  ZANOBIA GRIEBEL [address] [place], [place]  [address]  Dear Ms. Mccarley,  I appreciate the opportunity to serve you last week.  It was wonderful to meet you.  I hope that you are doing well today.  The reason that I am sending a letter to you is that you told me on your visit that you are having financial difficulty.  First, enclosed is the reduced pricing list for medication.  Furthermore, I have included information about the [facility] program at [facility] that helps people that have financial issues.  I hope that this information helps you.  I hope nothing but good health for you until I see you again in about a month.  Be blessed!  Sincerely,     Magnus Ivan MD

## 2010-09-13 NOTE — Assessment & Plan Note (Signed)
Summary: new patient   Vital Signs:  Patient profile:   45 year old female Height:      63 inches Weight:      251 pounds BMI:     44.62 BSA:     2.13 Temp:     98.7 degrees F Pulse rate:   72 / minute BP sitting:   110 / 72  Vitals Entered By: Jone Baseman CMA (Dec 31, 2009 9:53 AM) CC: New Patient Is Patient Diabetic? No Pain Assessment Patient in pain? yes     Location: back Intensity: 8   CC:  New Patient.  History of Present Illness: new patient, discussing acute problems: 1. non healing lesions-patient had been diagnosed with shingles on earlier in this month.  upon presentation to clinic, patient still with painful right arm lesions that are not healing properly.  pain reports pain disturbing her at night pt, numbness in her right lower arm (which is chronic from back pain), and no weakness 2.  GERD-pt with history of GERD that are bothering her.  pt experiencing burning, middle of the night coughing and acid sitting in her chest. pt denies chest pain.  zantac and aciphex have been used in the past and have not helped to keep symptoms under control. 3.  depression:  also part of patient's past medical history yet patient has not been able to afford medication.  bipolar disorder ruled out at [facility].  Habits & Providers  Alcohol-Tobacco-Diet     Tobacco Status: current     Tobacco Counseling: to quit use of tobacco products     Cigarette Packs/Day: 1.0  Exercise-Depression-Behavior     Does Patient Exercise: no  Current Medications (verified): 1)  Fluoxetine Hcl 20 Mg Caps (Fluoxetine Hcl) .... Take One Capsule in The Morning 2)  Synthroid 200 Mcg Tabs (Levothyroxine Sodium) .... Take 1 Tablet Once A Day 3)  Triamterene-Hctz 37.5-25 Mg Tabs (Triamterene-Hctz) .... Take One Tablet Once A Day 4)  Enalapril Maleate 20 Mg Tabs (Enalapril Maleate) .... Take 1 Tablet Once A Day 5)  Hydroxyzine Hcl 25 Mg Tabs (Hydroxyzine Hcl) .... Take One Tablet Once A  Day 6)  Budeprion Sr 100 Mg Xr12h-Tab (Bupropion Hcl) .... Take One Pill Once A Day 7)  Pravastatin Sodium 40 Mg Tabs (Pravastatin Sodium) .Marland Kitchen.. 1 Tablet Once A Day  Past History:  Past Surgical History: thyroidectomy 2000 C-section (1983, 1990, 1993) hysterectomy (2001) tubal ligation (1993)  Family History: mother deceased at age 35 father deceased at age 23 brother with aortic aneurysm father with heart disease sister with diabetes brother with stroke sudden death in mother  Social History: lives with husband Fayrene Fearing and 2 sons, Margaretha Sheffield and [place].  No pets.  Eats a heathy diet.  unemployed enjoys Therapist, art, movies.  obtained GED   Current Smoker-started in 1994, 1 ppd Alcohol use-no Regular exercise-no No recreational drug use Occasional sun exposureSmoking Status:  current Packs/Day:  1.0 Does Patient Exercise:  no  Review of Systems       as noted in hpi  Physical Exam  General:  NAD, obese vital signs noted and wnl Eyes:  wears corrective lenses Breasts:  R breast with small healing lesion Abdomen:  hypoactive bowel sounds, tenderness to palpation over epigastrum and right side of abdomen.  firmness in epigastrum Extremities:  edematous fingers bilaterally Skin:  2 ulcerated non healing lesions on dorsal side of R arm.  1x1 cm.  R axillae moist, slightly erythematous. Psych:  memory intact for recent and remote, normally interactive, and not anxious appearing.     Impression & Recommendations:  Problem # 1:  OTHER SPECIFIED DISORDER OF SKIN (ICD-709.8) Assessment New Patient still experiencing sequlae from shingles.  Gave pt tegarderm for moisture so that ulcers heal faster, and told her to purchase OTC tegaderm.  Problem # 2:  UNSPECIFIED DISORDER OF SKIN&SUBCUTANEOUS TISSUE (ICD-709.9) concerned that area in axillae is of fungal nature.  told patient to obtain OTC 1% clotramazole.  Problem # 3:  GERD (ICD-530.81) Assessment: New since  patient has failed other drug therapy for GERD, recommended that patient try OTC omeprazole.  Problem # 4:  DEPRESSION (ICD-311) pt states that she cannot afford current depression medication wellbutrin.  however, patient has been on prozac in the past which helped with depression.  patient has also used prozac with wellbutrin.  will try a trial of prozac as this issue seems to affect not only patient but her family as well. Her updated medication list for this problem includes:    Fluoxetine Hcl 20 Mg Caps (Fluoxetine hcl) .Marland Kitchen... Take one capsule in the morning    Hydroxyzine Hcl 25 Mg Tabs (Hydroxyzine hcl) .Marland Kitchen... Take one tablet once a day    Budeprion Sr 100 Mg Xr12h-tab (Bupropion hcl) .Marland Kitchen... Take one pill once a day  Complete Medication List: 1)  Fluoxetine Hcl 20 Mg Caps (Fluoxetine hcl) .... Take one capsule in the morning 2)  Synthroid 200 Mcg Tabs (Levothyroxine sodium) .... Take 1 tablet once a day 3)  Triamterene-hctz 37.5-25 Mg Tabs (Triamterene-hctz) .... Take one tablet once a day 4)  Enalapril Maleate 20 Mg Tabs (Enalapril maleate) .... Take 1 tablet once a day 5)  Hydroxyzine Hcl 25 Mg Tabs (Hydroxyzine hcl) .... Take one tablet once a day 6)  Budeprion Sr 100 Mg Xr12h-tab (Bupropion hcl) .... Take one pill once a day 7)  Pravastatin Sodium 40 Mg Tabs (Pravastatin sodium) .Marland Kitchen.. 1 tablet once a day  Patient Instructions: 1)  Mrs Choate, it was good to meet you today! 2)  I'm sorry that you are still in pain from your shingles. 3)  For the area under your arm, you can get OTC CLOTRAMAZOLE 1%.   4)  For the ulcers on your arm, you can use OTC tegaderm. 5)  For your GERD you can use OTC omeprazole. 6)  I am giving you a list of the different drugs you take along with different pharmacies with cheap pricing for your drugs. 7)  Please make an appointment in the next month in order to go through your other problems.   8)  Thank you and be blessed! Prescriptions: FLUOXETINE HCL 20 MG  CAPS (FLUOXETINE HCL) take one capsule in the morning  #30 x 0   Entered and Authorized by:   Magnus Ivan MD   Signed by:   Magnus Ivan MD on 12/31/2009   Method used:   Electronically to        [facility] (retail)       [address]       [place], [place]  [id number]       Ph: [phone]       Fax: [phone]   RxID:   [id number]

## 2010-09-15 NOTE — Letter (Signed)
Summary: [facility]   Imported By: Knox Royalty 07/26/2010 10:02:52  _____________________________________________________________________  External Attachment:    Type:   Image     Comment:   External Document

## 2010-10-07 ENCOUNTER — Other Ambulatory Visit: Payer: Self-pay | Admitting: Family Medicine

## 2010-10-07 NOTE — Telephone Encounter (Signed)
Refill request

## 2010-10-10 ENCOUNTER — Ambulatory Visit (HOSPITAL_COMMUNITY)
Admission: RE | Admit: 2010-10-10 | Discharge: 2010-10-10 | Disposition: A | Discharge status: Home / Self Care | Payer: Medicare Other | Source: Ambulatory Visit | Attending: Physical Medicine & Rehabilitation | Admitting: Physical Medicine & Rehabilitation

## 2010-10-10 ENCOUNTER — Emergency Department (HOSPITAL_COMMUNITY)
Admission: EM | Admit: 2010-10-10 | Discharge: 2010-10-10 | Disposition: A | Discharge status: Home / Self Care | Payer: Medicare Other | Source: Home / Self Care

## 2010-10-10 ENCOUNTER — Other Ambulatory Visit: Payer: Self-pay | Admitting: Physical Medicine & Rehabilitation

## 2010-10-10 DIAGNOSIS — IMO0002 Reserved for concepts with insufficient information to code with codable children: Secondary | ICD-10-CM | POA: Insufficient documentation

## 2010-10-10 DIAGNOSIS — M542 Cervicalgia: Secondary | ICD-10-CM | POA: Insufficient documentation

## 2010-10-10 DIAGNOSIS — M545 Low back pain, unspecified: Secondary | ICD-10-CM | POA: Insufficient documentation

## 2010-10-10 DIAGNOSIS — E7800 Pure hypercholesterolemia, unspecified: Secondary | ICD-10-CM | POA: Insufficient documentation

## 2010-10-10 DIAGNOSIS — I10 Essential (primary) hypertension: Secondary | ICD-10-CM | POA: Insufficient documentation

## 2010-10-10 DIAGNOSIS — F329 Major depressive disorder, single episode, unspecified: Secondary | ICD-10-CM | POA: Insufficient documentation

## 2010-10-10 DIAGNOSIS — G8929 Other chronic pain: Secondary | ICD-10-CM | POA: Insufficient documentation

## 2010-10-10 DIAGNOSIS — E039 Hypothyroidism, unspecified: Secondary | ICD-10-CM | POA: Insufficient documentation

## 2010-10-10 DIAGNOSIS — F3289 Other specified depressive episodes: Secondary | ICD-10-CM | POA: Insufficient documentation

## 2010-10-10 DIAGNOSIS — R52 Pain, unspecified: Secondary | ICD-10-CM

## 2010-10-14 ENCOUNTER — Ambulatory Visit (INDEPENDENT_AMBULATORY_CARE_PROVIDER_SITE_OTHER): Payer: Medicare Other | Admitting: Family Medicine

## 2010-10-14 ENCOUNTER — Encounter: Payer: Self-pay | Admitting: Family Medicine

## 2010-10-14 VITALS — HR 64 | Temp 97.4°F | Ht 63.0 in | Wt 286.0 lb

## 2010-10-14 DIAGNOSIS — B029 Zoster without complications: Secondary | ICD-10-CM

## 2010-10-14 MED ORDER — HYDROCODONE-ACETAMINOPHEN 5-325 MG PO TABS: 1.0000 | ORAL_TABLET | ORAL | Status: DC | PRN

## 2010-10-14 MED ORDER — VALACYCLOVIR HCL 1 G PO TABS: 500.0000 mg | ORAL_TABLET | Freq: Three times a day (TID) | ORAL | Status: AC

## 2010-10-14 NOTE — Progress Notes (Signed)
  Subjective:    Patient ID: Teresa Mcgee, female    DOB: 11-02-1965, 45 y.o.   MRN: [id number]  Rash This is a recurrent problem. The current episode started in the past 7 days. The problem has been gradually improving since onset. The affected locations include the chest. The rash is characterized by blistering, burning, pain, redness and itchiness. She was exposed to nothing. Past treatments include nothing.  appears to be a post-vesicular shingles lesion    Review of Systems  Skin: Positive for rash.       Objective:   Physical Exam  Skin: Lesion and rash noted. Rash is pustular and vesicular.             Assessment & Plan:  Recurrent herpes zoster infection of chest 1. Shingles - valtrex for 7 days

## 2010-12-13 DIAGNOSIS — G510 Bell's palsy: Secondary | ICD-10-CM

## 2010-12-13 HISTORY — DX: Bell's palsy: G51.0

## 2010-12-29 ENCOUNTER — Inpatient Hospital Stay: Payer: Medicare Other | Admitting: Internal Medicine

## 2011-01-05 ENCOUNTER — Ambulatory Visit (INDEPENDENT_AMBULATORY_CARE_PROVIDER_SITE_OTHER): Payer: Medicare Other | Admitting: Family Medicine

## 2011-01-05 ENCOUNTER — Encounter: Payer: Self-pay | Admitting: Family Medicine

## 2011-01-05 VITALS — BP 112/78 | HR 75 | Temp 97.8°F | Ht 63.0 in | Wt 257.0 lb

## 2011-01-05 DIAGNOSIS — S058X9A Other injuries of unspecified eye and orbit, initial encounter: Secondary | ICD-10-CM

## 2011-01-05 DIAGNOSIS — H6090 Unspecified otitis externa, unspecified ear: Secondary | ICD-10-CM

## 2011-01-05 DIAGNOSIS — S0501XA Injury of conjunctiva and corneal abrasion without foreign body, right eye, initial encounter: Secondary | ICD-10-CM

## 2011-01-05 DIAGNOSIS — G510 Bell's palsy: Secondary | ICD-10-CM

## 2011-01-05 DIAGNOSIS — H60399 Other infective otitis externa, unspecified ear: Secondary | ICD-10-CM

## 2011-01-05 MED ORDER — NEOMYCIN-POLYMYXIN-HC 3.5-10000-1 OT SOLN: 3.0000 [drp] | Freq: Three times a day (TID) | OTIC | Status: AC

## 2011-01-05 MED ORDER — LACRISERT 5 MG OP INST: 5.0000 mg | VAGINAL_INSERT | Freq: Every day | OPHTHALMIC | Status: DC

## 2011-01-05 MED ORDER — VALACYCLOVIR HCL 500 MG PO TABS: 1000.0000 mg | ORAL_TABLET | Freq: Three times a day (TID) | ORAL | Status: AC

## 2011-01-05 MED ORDER — PREDNISONE 20 MG PO TABS: ORAL_TABLET | ORAL | Status: DC

## 2011-01-05 NOTE — Assessment & Plan Note (Addendum)
Secondary to Bell's palsy and inability to fully close eye. Discovered on fluorescein eye stain. Refer to ophthomology, provide eye lubrication.  Provided with hard rather than soft eye patch for protection, she did not have one prior to today. To wear both at day and at night for protection.

## 2011-01-05 NOTE — Progress Notes (Signed)
  Subjective:    Patient ID: Teresa Mcgee, female    DOB: 1966/07/05, 45 y.o.   MRN: [id number]  HPI 1. Hospital followup for diagnosed stroke:   - Patient had acute onset of Right ear tinnitus and Right sided facial weakness on Wednesday evening.  Had difficulty closing her eye Wednesday night 1 week ago.  Went to visit sister in [place] on Thursday who was concerned for stroke and she presented to [facility] ED.  Admitted and diagnosed with acute CVA.  However, patient denies any right or left sided weakness, is unable to elevate her eyebrow on Right side, and has lost forehead crease on Right side.  Discharged on Sunday from hospital with discharge paperwork detailing her diagnoses including CVA.  No imaging provided with patient's paperwork.    Since discharge, she states she is feeling worse.  Eye is more red, painful because she believes she feels something "stuck" in it.  Saw unknown "eye doctor" on Monday, unsure if optometrist or ophthamologist.  Given OTC saline drops to help with "dry eye."  Continued eye drainage, inability to fully close eye.  Has lost majority of taste.  Ear pain which she describes as ache.     Review of Systems Denies any recent URI symptoms, extremity weakness, numbness, paresthesias, tick bites, outdoor exposure, rash.      Objective:   Physical Exam Gen:  Alert, cooperative patient who appears stated age, wearing thick sunglasses.  Vital signs reviewed. HEENT:  [place]/AT.  Lack of forehead crease, nasolabial fold non present on Right.  EOMI, pupils small but reactive to light.  Photophobia present, mostly in Right eye.  Right eye with conjunctival injection, tearing, no purulent drainage.  Fluoroscein eye stain performed and revealed corneal abrasion at lateral aspect of iris for Right eye.  Unable to fully close Right eye.  MMM, tonsils non-erythematous, non-edematous.  External ears WNL.  BL TM's pearly gray, Left ear canal WNL, Right ear canal with  erythema and pain on palpation of pinna.   Neck:  No cervical lymphadenopathy Cardiac:  Regular rate and rhythm without murmur auscultated.  Good S1/S2. Pulm:  Clear to auscultation bilaterally with good air movement.  No wheezes or rales noted.   Skin - no sores or suspicious lesions or rashes or color changes.  No ticks or apparent tick bites. MSK:  Strength 5/5 all 4 extremities Neuro:  CN II-XII intact, although smile is asymmetric.  Gait WNL, Romberg negative, No sensory deficits BL upper and lower extremities.            Assessment & Plan:

## 2011-01-05 NOTE — Assessment & Plan Note (Signed)
Incidental finding on exam, likely contributing to pain but not to tinnitus. Will treat with cortisporin ear drops.

## 2011-01-05 NOTE — Patient Instructions (Signed)
Take the Prednisone 60 mg a day for the next 7 days. Take the Valacyclovir three times a day for the next 7 days.  Go see the eye doctor.  We will call you with an appt. Use the ear drop in your ear 4 drops 4 times a day.   Come back and see [place] in 2 weeks or sooner if you're not doing better.

## 2011-01-05 NOTE — Assessment & Plan Note (Signed)
Most likely diagnosis. I do not think she has had a CVA -- although it may be possible she had both Bell's Palsy and CVA.  She has no residual weakness in her extremities, she is unable to elevate her Right eyebrow indicating a peripheral rather than central lesion, she has tinnitus in her Right ear, and she has lost the majority of her taste sensation, again pointing towards a more peripheral lesion.  This all occurred acutely on Wednesday evening. Very unlikely any mass affect impinging on peripheral nerve due to acuteness.   As she is at Grade IV on House-Brackmann scale, will treat with 1 week of prednisone/valacyclovir.   She is to return for re-assessment in 1-2 weeks or sooner if no improvement. Will obtain records from [facility], specifically any imaging of her neurological system.

## 2011-01-11 ENCOUNTER — Inpatient Hospital Stay: Payer: Medicare Other | Admitting: Family Medicine

## 2011-01-14 ENCOUNTER — Emergency Department (HOSPITAL_COMMUNITY)
Admission: EM | Admit: 2011-01-14 | Discharge: 2011-01-14 | Disposition: A | Discharge status: Home / Self Care | Payer: Medicare Other | Attending: Emergency Medicine | Admitting: Emergency Medicine

## 2011-01-14 ENCOUNTER — Encounter: Payer: Self-pay | Admitting: Family Medicine

## 2011-01-14 DIAGNOSIS — H5789 Other specified disorders of eye and adnexa: Secondary | ICD-10-CM | POA: Insufficient documentation

## 2011-01-14 DIAGNOSIS — I10 Essential (primary) hypertension: Secondary | ICD-10-CM | POA: Insufficient documentation

## 2011-01-14 DIAGNOSIS — R29810 Facial weakness: Secondary | ICD-10-CM | POA: Insufficient documentation

## 2011-01-14 DIAGNOSIS — Z8679 Personal history of other diseases of the circulatory system: Secondary | ICD-10-CM | POA: Insufficient documentation

## 2011-01-14 DIAGNOSIS — E039 Hypothyroidism, unspecified: Secondary | ICD-10-CM | POA: Insufficient documentation

## 2011-01-14 DIAGNOSIS — E7800 Pure hypercholesterolemia, unspecified: Secondary | ICD-10-CM | POA: Insufficient documentation

## 2011-01-14 DIAGNOSIS — G510 Bell's palsy: Secondary | ICD-10-CM | POA: Insufficient documentation

## 2011-01-14 NOTE — Progress Notes (Deleted)
  Subjective:    Patient ID: Teresa Mcgee, female    DOB: 1966/07/21, 45 y.o.   MRN: [id number]  HPI    Review of Systems     Objective:   Physical Exam        Assessment & Plan:  Teresa Mcgee presented to the ED today 06/02. She had run out of her medications for her Bell's palsy.  She has right-sided facial weakness s/p stroke 2 wks ago. She went to an outside hospital for this [facility]?). She was diagnosed with Bell's palsy 1 week ago.  The ED physician Dr. Patrica Duel

## 2011-01-14 NOTE — Progress Notes (Signed)
Teresa Mcgee presented to the ED today 06/02. She had run out of her medications for her Bell's palsy.  She has right-sided facial weakness s/p stroke 2 wks ago where she was seen at [facility].  She came to clinic 1 wk ago and was diagnosed with Bell's palsy.  The ED physician Dr. Patrica Duel saw the patient and re-filled her medications. She did not have any worrisome findings in the ED, including no evidence of new stroke or worsening weakness. She seemed to be about at her baseline, per her family members who accompanied her.  Recommend follow-up next week to medication re-fills and re-evaluation of Bell's palsy.  Will flag staff to arrange.

## 2011-01-23 ENCOUNTER — Other Ambulatory Visit: Payer: Self-pay | Admitting: Family Medicine

## 2011-01-23 ENCOUNTER — Telehealth: Payer: Self-pay | Admitting: *Deleted

## 2011-01-23 ENCOUNTER — Ambulatory Visit (INDEPENDENT_AMBULATORY_CARE_PROVIDER_SITE_OTHER): Payer: Medicare Other | Admitting: Family Medicine

## 2011-01-23 ENCOUNTER — Encounter: Payer: Self-pay | Admitting: Family Medicine

## 2011-01-23 VITALS — BP 136/85 | HR 85 | Temp 98.2°F | Wt 259.0 lb

## 2011-01-23 DIAGNOSIS — I639 Cerebral infarction, unspecified: Secondary | ICD-10-CM

## 2011-01-23 DIAGNOSIS — G510 Bell's palsy: Secondary | ICD-10-CM

## 2011-01-23 DIAGNOSIS — I10 Essential (primary) hypertension: Secondary | ICD-10-CM

## 2011-01-23 DIAGNOSIS — I6350 Cerebral infarction due to unspecified occlusion or stenosis of unspecified cerebral artery: Secondary | ICD-10-CM

## 2011-01-23 DIAGNOSIS — F17200 Nicotine dependence, unspecified, uncomplicated: Secondary | ICD-10-CM

## 2011-01-23 MED ORDER — FLUOXETINE HCL 40 MG PO CAPS: 40.0000 mg | ORAL_CAPSULE | Freq: Every day | ORAL | Status: DC

## 2011-01-23 MED ORDER — LEVOTHYROXINE SODIUM 25 MCG PO TABS: 25.0000 ug | ORAL_TABLET | Freq: Every day | ORAL | Status: DC

## 2011-01-23 MED ORDER — BUPROPION HCL ER (SR) 100 MG PO TB12: 200.0000 mg | ORAL_TABLET | Freq: Two times a day (BID) | ORAL | Status: DC

## 2011-01-23 MED ORDER — AZITHROMYCIN 250 MG PO TABS: ORAL_TABLET | ORAL | Status: DC

## 2011-01-23 MED ORDER — ENALAPRIL-HYDROCHLOROTHIAZIDE 5-12.5 MG PO TABS: 1.0000 | ORAL_TABLET | Freq: Every day | ORAL | Status: DC

## 2011-01-23 MED ORDER — AMLODIPINE BESYLATE 5 MG PO TABS: 5.0000 mg | ORAL_TABLET | Freq: Every day | ORAL | Status: DC

## 2011-01-23 MED ORDER — SIMVASTATIN 80 MG PO TABS: 80.0000 mg | ORAL_TABLET | Freq: Every day | ORAL | Status: DC

## 2011-01-23 MED ORDER — AZITHROMYCIN 500 MG PO TABS: 500.0000 mg | ORAL_TABLET | Freq: Every day | ORAL | Status: AC

## 2011-01-23 NOTE — Progress Notes (Signed)
Subjective:    Patient ID: Teresa Mcgee, female    DOB: May 09, 1966, 45 y.o.   MRN: [id number]  HPI 1. Stroke Seen at [facility] where she had a CT head and an MRI. She had Imaged based lesions. No imaging for me to review today. Since her stroke she has right sided facial droop, slurred speech and fatigue.   2. 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 Advised about prognosis.  3. Tobacco abuse Advised her to stop smoking or she will likely suffer another CVA. She says she has seen Dr. Raymondo Band already, but did not feel that it helped. She wants to stop smoking. She says the wellbutrin helps.  4. HTN 136/85 today, which is good. She is still smoking however and I would like to lower it further in lieu of recent stroke She will be on enalapril-hctz and amlodipine 5 mg QD Advised her to stop smoking.  5. Depression Post-stroke depression. She is also out of her Prozac/Wellbutrin. She has been sleeping for 16-18 hours a day. She isn't moving. She is normally a very talkative and spry individual. Husband here with her. She was tearing up in the office today. Spent 30 minutes counseling her and her husband. They will follow up in 2 weeks to see if the antidepressive meds helped and to see if their increased communication will help her adjust to her new deficits.  6. Sinusitis Congestions, maxillary sinus tenderness. No fever. Ear pain.  Normal ear exam. Normal lung exam.   Review of Systems  Constitutional: Positive for activity change and fatigue. Negative for fever, chills and appetite change.  HENT: Positive for ear pain, congestion, rhinorrhea, neck stiffness and sinus pressure. Negative for sore throat, trouble swallowing and neck pain.   Eyes: Positive for pain and discharge. Negative for photophobia, redness and visual disturbance.  Respiratory: Positive for cough. Negative for apnea and shortness of breath.   Cardiovascular: Negative for chest pain, palpitations and leg swelling.    Gastrointestinal: Negative for nausea, vomiting, abdominal pain, constipation and abdominal distention.  Skin: Negative for rash.  Neurological: Positive for facial asymmetry and speech difficulty. Negative for dizziness, seizures, light-headedness and headaches.  Psychiatric/Behavioral: Positive for sleep disturbance. Negative for suicidal ideas, self-injury and agitation. The patient is not nervous/anxious and is not hyperactive.        Objective:   Physical Exam  Constitutional: She is oriented to person, place, and time. She appears well-developed and well-nourished. No distress.  HENT:  Head: Normocephalic and atraumatic.  Nose: Right sinus exhibits maxillary sinus tenderness. Right sinus exhibits no frontal sinus tenderness. Left sinus exhibits maxillary sinus tenderness. Left sinus exhibits no frontal sinus tenderness.       Facial assymetry. Right facial droop of mouth/nose  Eyes: Conjunctivae and EOM are normal.  Neck: Normal range of motion. Neck supple.  Cardiovascular: Normal rate and regular rhythm.   Pulmonary/Chest: Effort normal and breath sounds normal. No respiratory distress. She has no wheezes.  Abdominal: Soft. She exhibits no distension. There is no tenderness.  Musculoskeletal: She exhibits no edema and no tenderness.  Neurological: She is alert and oriented to person, place, and time. No cranial nerve deficit.  Skin: She is not diaphoretic.  Psychiatric: Her speech is slurred. She is withdrawn. She exhibits a depressed mood.          Assessment & Plan:  1. Stroke Seen at [facility] where she had a CT head and an MRI. She had Imaged based lesions. No imaging for me  to review today. Since her stroke she has right sided facial droop, slurred speech and fatigue.   2. 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 Advised about prognosis.  3. Tobacco abuse Advised her to stop smoking or she will likely suffer another CVA. She says she has seen Dr. Raymondo Band already, but did  not feel that it helped. She wants to stop smoking. She says the wellbutrin helps.  4. HTN 136/85 today, which is good. She is still smoking however and I would like to lower it further in lieu of recent stroke She will be on enalapril-hctz and amlodipine 5 mg QD Advised her to stop smoking.  5. Depression Post-stroke depression. She is also out of her Prozac/Wellbutrin. She has been sleeping for 16-18 hours a day. She isn't moving. She is normally a very talkative and spry individual. Husband here with her. She was tearing up in the office today. Spent 30 minutes counseling her and her husband. They will follow up in 2 weeks to see if the antidepressive meds helped and to see if their increased communication will help her adjust to her new deficits.  6. Sinusitis Congestions, maxillary sinus tenderness. No fever. Ear pain.  Normal ear exam. Normal lung exam.

## 2011-01-23 NOTE — Telephone Encounter (Signed)
Received call from [facility] stating they received RX for Amlopidine.  Since patient is on Simvastatin 80 mg they needed to notify MD that there is an increased risk of side effects and risk of higher levels of simvastatin with Amlopidine therapy with patient on more than 20 mg simvastatin daily. Message given to Dr. Rivka Safer to  call pharmacy back at [phone] .

## 2011-02-07 ENCOUNTER — Encounter: Payer: Self-pay | Admitting: Family Medicine

## 2011-02-07 ENCOUNTER — Ambulatory Visit (INDEPENDENT_AMBULATORY_CARE_PROVIDER_SITE_OTHER): Payer: Medicare Other | Admitting: Family Medicine

## 2011-02-07 VITALS — BP 119/87 | HR 72 | Temp 97.1°F | Ht 63.0 in | Wt 256.0 lb

## 2011-02-07 DIAGNOSIS — H04129 Dry eye syndrome of unspecified lacrimal gland: Secondary | ICD-10-CM

## 2011-02-07 MED ORDER — TEARS RENEWED OP SOLN: 1.0000 [drp] | Freq: Three times a day (TID) | OPHTHALMIC | Status: DC | PRN

## 2011-02-07 MED ORDER — DEXAMETHASONE SODIUM PHOSPHATE 0.1 % OP SOLN: 1.0000 [drp] | Freq: Four times a day (QID) | OPHTHALMIC | Status: AC

## 2011-02-07 NOTE — Progress Notes (Signed)
  Subjective:    Patient ID: Teresa Mcgee, female    DOB: 1965/10/23, 45 y.o.   MRN: [id number]  HPI  1. Right eye foreign body sensation Patient suffered a stroke with subsequent right face bells palsy type paralysis. She cannot fully close her right eye allowing for debris to enter and for dry eye to occur. She was seen by an ophthalmologist who prescribed artificial tears. This has been helping some, but not fully. I reexamined her eye today. She has conjunctival injection but no conjunctivitis, she has inflammation of both lateral and medial caruncle. She does not have any adeniopathy, fever, chills, night sweats, or other symptoms. No foreign body present and where she feels foreign body is where the caruncles are inflamed. Her blink was equal bilateral, she was not able to completely close her right eye.   Review of Systems See above    Objective:   Physical Exam  Eyes: EOM and lids are normal. Pupils are equal, round, and reactive to light. Right eye exhibits no chemosis, no discharge, no exudate and no hordeolum. No foreign body present in the right eye. Right conjunctiva is injected.         Injection. inflammed caruncles.        Assessment & Plan:  1. 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 Away from dusty environments.

## 2011-03-21 ENCOUNTER — Ambulatory Visit (INDEPENDENT_AMBULATORY_CARE_PROVIDER_SITE_OTHER): Payer: Medicare Other | Admitting: Family Medicine

## 2011-03-21 ENCOUNTER — Encounter: Payer: Self-pay | Admitting: Family Medicine

## 2011-03-21 VITALS — BP 127/83 | HR 65 | Wt 250.2 lb

## 2011-03-21 DIAGNOSIS — R5383 Other fatigue: Secondary | ICD-10-CM | POA: Insufficient documentation

## 2011-03-21 DIAGNOSIS — I10 Essential (primary) hypertension: Secondary | ICD-10-CM

## 2011-03-21 DIAGNOSIS — R200 Anesthesia of skin: Secondary | ICD-10-CM

## 2011-03-21 DIAGNOSIS — R5381 Other malaise: Secondary | ICD-10-CM

## 2011-03-21 DIAGNOSIS — R7989 Other specified abnormal findings of blood chemistry: Secondary | ICD-10-CM

## 2011-03-21 DIAGNOSIS — Z8639 Personal history of other endocrine, nutritional and metabolic disease: Secondary | ICD-10-CM

## 2011-03-21 DIAGNOSIS — F329 Major depressive disorder, single episode, unspecified: Secondary | ICD-10-CM

## 2011-03-21 DIAGNOSIS — E039 Hypothyroidism, unspecified: Secondary | ICD-10-CM

## 2011-03-21 DIAGNOSIS — R209 Unspecified disturbances of skin sensation: Secondary | ICD-10-CM

## 2011-03-21 DIAGNOSIS — Z862 Personal history of diseases of the blood and blood-forming organs and certain disorders involving the immune mechanism: Secondary | ICD-10-CM

## 2011-03-21 DIAGNOSIS — E7800 Pure hypercholesterolemia, unspecified: Secondary | ICD-10-CM

## 2011-03-21 DIAGNOSIS — F3289 Other specified depressive episodes: Secondary | ICD-10-CM

## 2011-03-21 LAB — CBC
HCT: 39.9 % (ref 36.0–46.0)
Hemoglobin: 12.8 g/dL (ref 12.0–15.0)
MCV: 95.5 fL (ref 78.0–100.0)
WBC: 6 10*3/uL (ref 4.0–10.5)

## 2011-03-21 LAB — LIPID PANEL
Cholesterol: 291 mg/dL — ABNORMAL HIGH (ref 0–200)
HDL: 51 mg/dL (ref >39)
Total CHOL/HDL Ratio: 5.7 Ratio
Triglycerides: 153 mg/dL — ABNORMAL HIGH (ref <150)

## 2011-03-21 LAB — COMPREHENSIVE METABOLIC PANEL
Albumin: 4.6 g/dL (ref 3.5–5.2)
BUN: 5 mg/dL — ABNORMAL LOW (ref 6–23)
Calcium: 9.8 mg/dL (ref 8.4–10.5)
Chloride: 97 mEq/L (ref 96–112)
Creat: 0.91 mg/dL (ref 0.50–1.10)
Glucose, Bld: 96 mg/dL (ref 70–99)
Potassium: 3.5 mEq/L (ref 3.5–5.3)

## 2011-03-21 LAB — TSH: TSH: 93.783 u[IU]/mL — ABNORMAL HIGH (ref 0.350–4.500)

## 2011-03-21 NOTE — Patient Instructions (Addendum)
Go to [facility] and get records from your hospitalization including any CT's or MRI's and all tests done. Make appointment for 2 weeks for PCP or myself. If you notice other symptoms of weakness, worsening, then go to ER.

## 2011-03-23 ENCOUNTER — Ambulatory Visit: Payer: Medicare Other | Admitting: Family Medicine

## 2011-03-23 MED ORDER — LEVOTHYROXINE SODIUM 112 MCG PO TABS: 112.0000 ug | ORAL_TABLET | Freq: Every day | ORAL | Status: DC

## 2011-03-23 NOTE — Assessment & Plan Note (Signed)
Possibly due to hypothyroidism.  Will recheck when we repeat TSH in 6 weeks

## 2011-03-23 NOTE — Assessment & Plan Note (Addendum)
Glove distribution of left hand, neg Tinels,  No evidence of new focal deficits.  Possibly compression neurpathy vs metabolic.  Will check TSH, CBC, CMET, HIV, B12.

## 2011-03-23 NOTE — Progress Notes (Signed)
  Subjective:    Patient ID: Teresa Mcgee, female    DOB: April 11, 1966, 45 y.o.   MRN: [id number]  HPI 45 yo for same day appt for 1 week of left hand numbness.  PMH sig for hx of stroke vs bells palsy with right sided facial weakness 3 months ago.  Upon awakening noted left hand numbness, no weakness.  Has persisted.  Points to 5th digit as area of maximal numbness but also points to entire hand both dorsal and ventral surfaces as affected.  No symptoms past the wrist.  No other headache, confusion, weakness, pain.  Has residual right sided facial droop.  Hx of stroke:  She is concerned she is not getting any follow-up for her stroke as she has a family member who also recently had a stroke who has lots of follow-up appts.  She has not brought any records from her hospitalization at [facility] regional- unclear if she actually had a stroke per prior provider notes.    Review of Systems Neg except as per HPI    Objective:   Physical Exam GEN: Alert & Oriented, No acute distress CV:  Regular Rate & Rhythm, no murmur Respiratory:  Normal work of breathing, CTAB Abd:  + BS, soft, no tenderness to palpation Ext: no pre-tibial edema Neuro: PERRLA, EOMI.  Slight right facial droop. Sensation to light touch intact in left hand but patient notes feels different.  Radial pulses 2+.  5/5 grip strength.   Patellar and biceps reflexes 2+ bilaterally.  Strength 5/5 upper and lower extremities.  Neg tinels.        Assessment & Plan:

## 2011-03-23 NOTE — Assessment & Plan Note (Signed)
LDL elevated.  Needs statin.  Given LFT mildly elevated, will wait until follow-up visit to start.  I suspect LFT's will be improved once hypothyroidism controlled.

## 2011-04-04 ENCOUNTER — Ambulatory Visit (INDEPENDENT_AMBULATORY_CARE_PROVIDER_SITE_OTHER): Payer: Medicare Other | Admitting: Family Medicine

## 2011-04-04 ENCOUNTER — Encounter: Payer: Self-pay | Admitting: Family Medicine

## 2011-04-04 VITALS — BP 121/87 | HR 67 | Temp 97.4°F | Ht 63.0 in | Wt 249.3 lb

## 2011-04-04 DIAGNOSIS — R7989 Other specified abnormal findings of blood chemistry: Secondary | ICD-10-CM

## 2011-04-04 DIAGNOSIS — G510 Bell's palsy: Secondary | ICD-10-CM

## 2011-04-04 DIAGNOSIS — E7800 Pure hypercholesterolemia, unspecified: Secondary | ICD-10-CM

## 2011-04-04 DIAGNOSIS — E039 Hypothyroidism, unspecified: Secondary | ICD-10-CM

## 2011-04-04 LAB — COMPREHENSIVE METABOLIC PANEL
AST: 105 U/L — ABNORMAL HIGH (ref 0–37)
Alkaline Phosphatase: 71 U/L (ref 39–117)
Glucose, Bld: 105 mg/dL — ABNORMAL HIGH (ref 70–99)
Sodium: 138 mEq/L (ref 135–145)
Total Bilirubin: 0.6 mg/dL (ref 0.3–1.2)
Total Protein: 7.7 g/dL (ref 6.0–8.3)

## 2011-04-04 MED ORDER — ROSUVASTATIN CALCIUM 10 MG PO TABS: 10.0000 mg | ORAL_TABLET | Freq: Every day | ORAL | Status: DC

## 2011-04-04 MED ORDER — FLUTICASONE PROPIONATE 50 MCG/ACT NA SUSP: 2.0000 | Freq: Every day | NASAL | Status: DC

## 2011-04-04 NOTE — Assessment & Plan Note (Addendum)
Did not start synthroid until 1 week ago.  She was only on 25 mcg and I increased her to 112 mcg.  She reports on being on 100 mcg in the past.  Will have her follow-up with PCP in 3-4 weeks to recheck,

## 2011-04-04 NOTE — Assessment & Plan Note (Signed)
Markedly elevated, will change from simvastatin 20 to crestor 10 with goal of titrating up.  Asked her to not start until we get back normal lft's.

## 2011-04-04 NOTE — Assessment & Plan Note (Signed)
Will check today before restarting statin.

## 2011-04-04 NOTE — Assessment & Plan Note (Addendum)
Dx by a previous provider as Bell's Palsy.  Patient is concerned she has had a stroke in the past.  It is unclear to me if this is true and I have asked her to obtain records with imaging so that we may be able accurately manage her risks and place her on appropriate treatment.

## 2011-04-04 NOTE — Patient Instructions (Addendum)
Please get hospital records from [facility] and follow-up with Dr. Rivka Safer in 3-4 weeks. New medicine- crestor dont' start until we get normal liver  lab results back. flonase for nasal congestion

## 2011-04-04 NOTE — Progress Notes (Signed)
  Subjective:    Patient ID: Teresa Mcgee, female    DOB: [birth year], 45 y.o.   MRN: [id number]  HPI  Here for follow-up visit to discuss lab results and review records from outside hospital which have not arrived.  Was seen two weeks ago for numbness, tingling of left arm which continues.  [facility] at that time was markedly abnormal showing elevated TSH, Lipids, slightly elevated LFT's.    Review of Systems     Objective:   Physical Exam        Assessment & Plan:

## 2011-04-05 ENCOUNTER — Telehealth: Payer: Self-pay | Admitting: Family Medicine

## 2011-04-05 ENCOUNTER — Encounter: Payer: Self-pay | Admitting: Family Medicine

## 2011-04-05 NOTE — Telephone Encounter (Signed)
Unable to contact husband but did leave message with patient.  WIll send letter.

## 2011-04-20 ENCOUNTER — Ambulatory Visit (INDEPENDENT_AMBULATORY_CARE_PROVIDER_SITE_OTHER): Payer: Medicare Other | Admitting: Family Medicine

## 2011-04-20 ENCOUNTER — Encounter: Payer: Self-pay | Admitting: Family Medicine

## 2011-04-20 VITALS — BP 139/90 | HR 74 | Temp 98.6°F | Ht 63.0 in | Wt 247.0 lb

## 2011-04-20 DIAGNOSIS — J069 Acute upper respiratory infection, unspecified: Secondary | ICD-10-CM

## 2011-04-20 NOTE — Assessment & Plan Note (Signed)
Common cold.  Given patient education and red flags for return.

## 2011-04-20 NOTE — Progress Notes (Signed)
  Subjective:    Patient ID: Teresa Mcgee, female    DOB: 08-31-1965, 45 y.o.   MRN: [id number]  HPI  Work in appt for cough and sore throat x 4 days.  Feels she may have gotten sick after visiting a family member in [facility] who has pneumonia.    Review of SystemsGeneral:  Negative for fever, chills, malaise  Positive for myalgias but also feels it may be her chronic back pain HEENT: Negative for conjunctivitis, ear pain or drainage, rhinorrhea Positive for nasal congestion, sore throat Respiratory:  Negative for sputum, dyspnea Abdomen: Negative for abdominal pain, emesis, diarrhea Skin:  Negative for rash         Objective:   Physical Exam  GEN: Alert & Oriented, No acute distress CV:  Regular Rate & Rhythm, no murmur HEENT: [place]/AT. EOMI, PERRLA, no conjunctival injection or scleral icterus.  Bilateral tympanic membranes intact without erythema or effusion.  .  Nares without edema or rhinorrhea.  Oropharynx is without erythema or exudates.  No anterior or posterior cervical lymphadenopathy. Respiratory:  Normal work of breathing, CTAB Abd:  + BS, soft, no tenderness to palpation Ext: no pre-tibial edema       Assessment & Plan:

## 2011-04-20 NOTE — Patient Instructions (Addendum)
See info on cold Will try to help you get records from your hospital Keep appointment with your primary doctor!

## 2011-04-25 ENCOUNTER — Ambulatory Visit (INDEPENDENT_AMBULATORY_CARE_PROVIDER_SITE_OTHER): Payer: Medicare Other | Admitting: Family Medicine

## 2011-04-25 ENCOUNTER — Encounter: Payer: Self-pay | Admitting: Family Medicine

## 2011-04-25 VITALS — BP 118/82 | Temp 97.7°F | Ht 63.0 in | Wt 246.8 lb

## 2011-04-25 DIAGNOSIS — G4710 Hypersomnia, unspecified: Secondary | ICD-10-CM

## 2011-04-25 DIAGNOSIS — F329 Major depressive disorder, single episode, unspecified: Secondary | ICD-10-CM

## 2011-04-25 DIAGNOSIS — E039 Hypothyroidism, unspecified: Secondary | ICD-10-CM

## 2011-04-25 DIAGNOSIS — J3489 Other specified disorders of nose and nasal sinuses: Secondary | ICD-10-CM

## 2011-04-25 DIAGNOSIS — J340 Abscess, furuncle and carbuncle of nose: Secondary | ICD-10-CM

## 2011-04-25 DIAGNOSIS — F3289 Other specified depressive episodes: Secondary | ICD-10-CM

## 2011-04-25 DIAGNOSIS — F32A Depression, unspecified: Secondary | ICD-10-CM

## 2011-04-25 DIAGNOSIS — Z23 Encounter for immunization: Secondary | ICD-10-CM

## 2011-04-25 MED ORDER — LEVOTHYROXINE SODIUM 200 MCG PO TABS: 200.0000 ug | ORAL_TABLET | Freq: Every day | ORAL | Status: DC

## 2011-04-25 MED ORDER — TETANUS-DIPHTH-ACELL PERTUSSIS 5-2.5-18.5 LF-MCG/0.5 IM SUSP: 0.5000 mL | Freq: Once | INTRAMUSCULAR | Status: DC

## 2011-04-25 MED ORDER — LIOTHYRONINE SODIUM 25 MCG PO TABS: 25.0000 ug | ORAL_TABLET | Freq: Every day | ORAL | Status: DC

## 2011-04-25 MED ORDER — MUPIROCIN 2 % EX OINT: TOPICAL_OINTMENT | CUTANEOUS | Status: DC

## 2011-04-25 NOTE — Patient Instructions (Signed)
Please take the thyroid medication. This should help your symptoms. Come back in one month.

## 2011-04-25 NOTE — Progress Notes (Signed)
  Subjective:    Patient ID: Teresa Mcgee, female    DOB: [birth year], 45 y.o.   MRN: [id number]  HPI 1. Residual Facial droop from Bells palsy (no history of stroke) Improvement of patients right eye.   2. Hypothyroidism Patient's last TSH was 93.7. Which is increased from prior lab study. She has been on T4, but at 112 mcg, she was previously taking 250 mcg, and although she still had symptoms, she had some improvement.  Symptoms include hypersomnolence, weight gain, fatigue.  3. Hypersomnolence Patient is sleeping 18 hour days. We initially attributed this to depression 2nd to recent stroke. However on review of MRI from [facility], she had no infarct. She does have a bells palsy type facial droop. She does have a hx of hypothyroidism and is on a lower dose of T4 than in the past. No sleep apnea history. No alcohol or drug abuse.   4. Nasal ulcer Right nostril pain. Excess use of flonase. Dry mucous membranes. No hx of MRSA.   5. Depression Patient has had crying spells. She has hypersomnolence. She is fatigued. She does not feel the Prozac and Wellbutrin have helped her.  Review of Systems  Constitutional: Positive for activity change and fatigue. Negative for fever, chills, diaphoresis, appetite change and unexpected weight change.  HENT: Negative for sore throat, trouble swallowing and voice change.        Nasal ulcer  Eyes: Negative for visual disturbance.  Respiratory: Negative for shortness of breath.   Cardiovascular: Negative for chest pain, palpitations and leg swelling.  Gastrointestinal: Negative for abdominal pain.  Genitourinary: Negative for dysuria.  Musculoskeletal: Negative for myalgias, joint swelling and arthralgias.  Skin: Negative for rash.  Neurological: Positive for facial asymmetry. Negative for syncope, speech difficulty, weakness and headaches.  Psychiatric/Behavioral: Positive for sleep disturbance and dysphoric mood. Negative for suicidal ideas,  self-injury and agitation. The patient is not nervous/anxious.        Objective:   Physical Exam  Nursing note and vitals reviewed. Constitutional: She is oriented to person, place, and time. She appears well-developed and well-nourished. No distress.       obese  HENT:  Head: Normocephalic and atraumatic.  Neck: Normal range of motion. Neck supple. No thyromegaly present.  Neurological: She is alert and oriented to person, place, and time. She has normal reflexes. No cranial nerve deficit.  Skin: No rash noted. She is not diaphoretic.  Psychiatric: She has a normal mood and affect. Her behavior is normal. Judgment and thought content normal.      Assessment & Plan:  1. Residual Facial droop from Bells palsy (no history of stroke) Will continue to follow for improvement. She has noticed some.  2. Hypothyroidism Will increase her T4 to 200 mcg Will add T3 at low dose - evidence that some individuals don't respond to T4 appropriately. Still has elevated TSH TSH check in one month  3. Hypersomnolence Depression vs. Hypothyroidism  4. Nasal ulcer Stop using flonase till it heals. bactroban nasally  5. Depression She does not feel the medication is helping, will reevaluate this in one month, to see if thyroid medication has worked.

## 2011-05-23 ENCOUNTER — Encounter: Payer: Self-pay | Admitting: Family Medicine

## 2011-05-23 ENCOUNTER — Ambulatory Visit (INDEPENDENT_AMBULATORY_CARE_PROVIDER_SITE_OTHER): Payer: Medicare Other | Admitting: Family Medicine

## 2011-05-23 DIAGNOSIS — Z23 Encounter for immunization: Secondary | ICD-10-CM

## 2011-05-23 DIAGNOSIS — F329 Major depressive disorder, single episode, unspecified: Secondary | ICD-10-CM

## 2011-05-23 DIAGNOSIS — J3489 Other specified disorders of nose and nasal sinuses: Secondary | ICD-10-CM | POA: Insufficient documentation

## 2011-05-23 DIAGNOSIS — J3481 Nasal mucositis (ulcerative): Secondary | ICD-10-CM | POA: Insufficient documentation

## 2011-05-23 DIAGNOSIS — E039 Hypothyroidism, unspecified: Secondary | ICD-10-CM

## 2011-05-23 DIAGNOSIS — F3289 Other specified depressive episodes: Secondary | ICD-10-CM

## 2011-05-23 DIAGNOSIS — E7800 Pure hypercholesterolemia, unspecified: Secondary | ICD-10-CM

## 2011-05-23 DIAGNOSIS — M62838 Other muscle spasm: Secondary | ICD-10-CM | POA: Insufficient documentation

## 2011-05-23 DIAGNOSIS — F32A Depression, unspecified: Secondary | ICD-10-CM

## 2011-05-23 MED ORDER — MELOXICAM 7.5 MG PO TABS: 15.0000 mg | ORAL_TABLET | Freq: Every day | ORAL | Status: DC

## 2011-05-23 MED ORDER — VENLAFAXINE HCL ER 75 MG PO CP24: 150.0000 mg | ORAL_CAPSULE | Freq: Every day | ORAL | Status: DC

## 2011-05-23 NOTE — Patient Instructions (Signed)
Continue using bactroban in your nose Stop using CPAP for one week.

## 2011-05-23 NOTE — Assessment & Plan Note (Signed)
Continue with wellbutrin dc'd fluoxetine Will start Venlafaxine for NE stimulation.

## 2011-05-23 NOTE — Assessment & Plan Note (Signed)
Neck exercises printed and given to patient. mobic qd for three days. Warm compress Patient declined trigger point injection.

## 2011-05-23 NOTE — Assessment & Plan Note (Signed)
Discovered on exam of her nostrils for ulcers. She has no drug use history per patient.

## 2011-05-23 NOTE — Progress Notes (Signed)
  Subjective:    Patient ID: Teresa Mcgee, female    DOB: 03-Oct-1965, 45 y.o.   MRN: [id number]  HPI 1. Hypothyroidism Patient has taken her medication as prescribed. She has not noticed any improvement in symptoms.  2. Neck pain Woke up with right neck muscle pain. On exam the muscle is tight and tender. No spinal tenderness. No neurological symptoms or signs.   3. Nasal ulceration Found to have nasal perforation on examination. I did not see this last visit. She denies drug use. She has used flonase for a long period of time. She sleeps with a cpap. Her nasal ulcerations are not improved from last visit.   4. Depression/fatigue Still tired, still sleeping for long periods of time. She has been taking Wellbutrin and fluoxetine without improvement.     Review of Systems  Constitutional: Positive for activity change, appetite change and fatigue. Negative for fever, chills, diaphoresis and unexpected weight change.  HENT: Positive for congestion, neck pain, neck stiffness and sinus pressure. Negative for nosebleeds and rhinorrhea.   Eyes: Negative for visual disturbance.  Respiratory: Negative for apnea, chest tightness and shortness of breath.   Cardiovascular: Negative for chest pain, palpitations and leg swelling.  Genitourinary: Negative for dysuria.  Musculoskeletal: Negative for myalgias, joint swelling and arthralgias.  Skin: Negative for rash.  Hematological: Negative for adenopathy.  Psychiatric/Behavioral: Positive for dysphoric mood.       Objective:   Physical Exam  Nursing note and vitals reviewed. Constitutional: She appears well-developed and well-nourished. No distress.       Obese   HENT:  Mouth/Throat: Oropharynx is clear and moist.       Right facial droop Perforated nasal septum Nasal ulcerations     Neck: No thyromegaly present.       Tender paraspinal muscles right side   Pulmonary/Chest: Effort normal and breath sounds normal. No respiratory  distress.  Musculoskeletal: She exhibits no edema and no tenderness.  Neurological: No cranial nerve deficit.  Skin: No rash noted. She is not diaphoretic. No erythema.      Assessment & Plan:

## 2011-05-23 NOTE — Assessment & Plan Note (Signed)
Continue to use bactroban Stop using cpap machine for one week to allow healing.

## 2011-05-23 NOTE — Assessment & Plan Note (Signed)
Currently taking cytomel and levothyroxine. She has not noticed any change in energy levels. Will check a repeat TSH today

## 2011-05-24 LAB — TSH: TSH: 0.047 u[IU]/mL — ABNORMAL LOW (ref 0.350–4.500)

## 2011-05-26 ENCOUNTER — Other Ambulatory Visit: Payer: Self-pay | Admitting: Family Medicine

## 2011-05-26 NOTE — Telephone Encounter (Signed)
Called patient to discuss her TSH monitoring. Her last level was 0.047, which is low, but not in the dangerous range of less than 0.03. I do want to have her TSH checked again to see if we need to decrease her Cytomel which was recently started. She was not at home however, she will call me on Monday and I will discuss this with her.

## 2011-06-08 ENCOUNTER — Encounter: Payer: Self-pay | Admitting: Family Medicine

## 2011-06-08 ENCOUNTER — Ambulatory Visit (INDEPENDENT_AMBULATORY_CARE_PROVIDER_SITE_OTHER): Payer: Medicare Other | Admitting: Family Medicine

## 2011-06-08 VITALS — BP 114/81 | HR 78 | Temp 98.2°F | Ht 63.0 in | Wt 241.8 lb

## 2011-06-08 DIAGNOSIS — E039 Hypothyroidism, unspecified: Secondary | ICD-10-CM

## 2011-06-08 DIAGNOSIS — F329 Major depressive disorder, single episode, unspecified: Secondary | ICD-10-CM

## 2011-06-08 DIAGNOSIS — F3289 Other specified depressive episodes: Secondary | ICD-10-CM

## 2011-06-08 DIAGNOSIS — Z124 Encounter for screening for malignant neoplasm of cervix: Secondary | ICD-10-CM

## 2011-06-08 DIAGNOSIS — M62838 Other muscle spasm: Secondary | ICD-10-CM

## 2011-06-08 DIAGNOSIS — Z0000 Encounter for general adult medical examination without abnormal findings: Secondary | ICD-10-CM

## 2011-06-08 LAB — BASIC METABOLIC PANEL
CO2: 27 mEq/L (ref 19–32)
Calcium: 9.7 mg/dL (ref 8.4–10.5)
Chloride: 99 mEq/L (ref 96–112)
Glucose, Bld: 107 mg/dL — ABNORMAL HIGH (ref 70–99)
Potassium: 3.5 mEq/L (ref 3.5–5.3)
Sodium: 137 mEq/L (ref 135–145)

## 2011-06-08 MED ORDER — MELOXICAM 7.5 MG PO TABS: 15.0000 mg | ORAL_TABLET | Freq: Every day | ORAL | Status: DC

## 2011-06-08 NOTE — Progress Notes (Signed)
  Subjective:    Patient ID: Teresa Mcgee, female    DOB: 1966/05/18, 45 y.o.   MRN: [id number]  HPI 1. CPE Doing well. No specific complaints.  2. Hypothyroidism Starting to lose weight. No tachycardia. No anxiety. Feeling more active. She is compliant with medications.   3. Depression Doing better. Periodic sadness and hypersomnolence. Starting to have weight loss and more activity. Is seeing a counselor as well. No suicidality, no homicidality. No other mood changes.   Review of Systems  Constitutional: Positive for activity change. Negative for fever, chills, diaphoresis, appetite change, fatigue and unexpected weight change.  HENT: Positive for congestion. Negative for sinus pressure.   Eyes: Negative for visual disturbance.  Respiratory: Negative for shortness of breath.   Cardiovascular: Negative for chest pain, palpitations and leg swelling.  Gastrointestinal: Negative for abdominal pain.  Genitourinary: Positive for vaginal discharge. Negative for dysuria, vaginal bleeding, vaginal pain and menstrual problem.  Musculoskeletal: Positive for back pain. Negative for gait problem.  Skin: Negative for rash.  Neurological: Negative for syncope, weakness and headaches.  Psychiatric/Behavioral: Positive for dysphoric mood. Negative for suicidal ideas and sleep disturbance. The patient is not nervous/anxious and is not hyperactive.        Objective:   Physical Exam  Nursing note and vitals reviewed. Constitutional: She appears well-developed and well-nourished. No distress.  HENT:  Head: Normocephalic and atraumatic.  Neck: Neck supple.       Thyroidectomy scar  Cardiovascular: Normal rate, regular rhythm and normal heart sounds.   No murmur heard. Pulmonary/Chest: Effort normal and breath sounds normal. No respiratory distress. She has no wheezes. She has no rales.  Genitourinary: Vagina normal. No vaginal discharge found.       No cervix present  Musculoskeletal: She  exhibits no edema.  Skin: No rash noted. She is not diaphoretic.  Psychiatric: She has a normal mood and affect. Her behavior is normal.      Assessment & Plan:

## 2011-06-08 NOTE — Assessment & Plan Note (Signed)
Will follow up TSH today Symptomatically better.  C/w current medication.

## 2011-06-08 NOTE — Assessment & Plan Note (Signed)
Doing better. Less sad days. Periodic moments of sadness. Still hyper somnolent. Has noticed improvement with thyroid medication and venlafaxine.

## 2011-06-08 NOTE — Patient Instructions (Signed)
I will call you if your tests are not good.  Otherwise I will send you a letter.  If you do not hear from me with in 2 weeks please call our office.     We will not change any of your medications today.  Continue making great progress!

## 2011-06-09 ENCOUNTER — Telehealth: Payer: Self-pay | Admitting: Family Medicine

## 2011-06-09 NOTE — Telephone Encounter (Signed)
Left message with patients son. He will tell her to only take half of her levothyroxine dose 100 mg qd. She is instructed to call me next week to discuss her thyroid tests.

## 2011-06-12 ENCOUNTER — Other Ambulatory Visit: Payer: Self-pay | Admitting: Family Medicine

## 2011-06-12 NOTE — Telephone Encounter (Signed)
Refill request

## 2011-06-14 ENCOUNTER — Other Ambulatory Visit: Payer: Self-pay | Admitting: Family Medicine

## 2011-06-14 NOTE — Telephone Encounter (Signed)
Patient doing well. She will half her synthroid in lieu of her TSH being 0.33. She will follow up in one month.  She has no symptoms of hyperthyroidism.

## 2011-06-30 ENCOUNTER — Telehealth: Payer: Self-pay | Admitting: Family Medicine

## 2011-06-30 DIAGNOSIS — F32A Depression, unspecified: Secondary | ICD-10-CM

## 2011-06-30 DIAGNOSIS — F329 Major depressive disorder, single episode, unspecified: Secondary | ICD-10-CM

## 2011-06-30 MED ORDER — VENLAFAXINE HCL ER 75 MG PO CP24: 75.0000 mg | ORAL_CAPSULE | Freq: Every day | ORAL | Status: DC

## 2011-06-30 NOTE — Telephone Encounter (Signed)
Pt is back home now and will await Dr Rolene Arbour call

## 2011-06-30 NOTE — Telephone Encounter (Signed)
Patient states she is having nightmares. Since starting effexor. Will cut the dose in half to 75 mg daily from 150.  Patient asked for oxycodone script for back pain because she is going on a trip to [place]. I advised her to periodically stop the care and get out and stretch. I told her that I would not prescribe any narcotic medications.

## 2011-06-30 NOTE — Telephone Encounter (Signed)
Patient requesting to speak with MD re: her meds and some things that are going on with her, doesn't want to wait until her appt.

## 2011-06-30 NOTE — Telephone Encounter (Signed)
Left message with husband for patient to call me back.  I will try calling her again later.

## 2011-06-30 NOTE — Telephone Encounter (Signed)
Addended by: Edd Arbour on: 06/30/2011 03:10 PM   Modules accepted: Orders

## 2011-07-12 ENCOUNTER — Encounter: Payer: Self-pay | Admitting: Family Medicine

## 2011-07-12 ENCOUNTER — Ambulatory Visit (INDEPENDENT_AMBULATORY_CARE_PROVIDER_SITE_OTHER): Payer: Medicare Other | Admitting: Family Medicine

## 2011-07-12 VITALS — BP 136/84 | HR 84 | Temp 98.1°F | Ht 63.0 in | Wt 243.5 lb

## 2011-07-12 DIAGNOSIS — N62 Hypertrophy of breast: Secondary | ICD-10-CM

## 2011-07-12 DIAGNOSIS — E039 Hypothyroidism, unspecified: Secondary | ICD-10-CM

## 2011-07-12 DIAGNOSIS — M62838 Other muscle spasm: Secondary | ICD-10-CM

## 2011-07-12 DIAGNOSIS — J3489 Other specified disorders of nose and nasal sinuses: Secondary | ICD-10-CM

## 2011-07-12 MED ORDER — CYCLOBENZAPRINE HCL 10 MG PO TABS: 10.0000 mg | ORAL_TABLET | Freq: Three times a day (TID) | ORAL | Status: DC | PRN

## 2011-07-12 MED ORDER — MELOXICAM 7.5 MG PO TABS: 15.0000 mg | ORAL_TABLET | Freq: Every day | ORAL | Status: DC

## 2011-07-12 MED ORDER — HYDROCODONE-ACETAMINOPHEN 5-500 MG PO TABS: 1.0000 | ORAL_TABLET | Freq: Three times a day (TID) | ORAL | Status: DC | PRN

## 2011-07-12 MED ORDER — LEVOTHYROXINE SODIUM 100 MCG PO TABS: 100.0000 ug | ORAL_TABLET | Freq: Every day | ORAL | Status: DC

## 2011-07-12 NOTE — Progress Notes (Signed)
  Subjective:    Patient ID: Teresa Mcgee, female    DOB: 27-Aug-1965, 45 y.o.   MRN: [id number]  HPI 1. Nasal perforation Chronic. associated with nasal mucositis. Associated with chronic sinusitis.  No cocaine history. Using bactroban currently.   2. Neck and shoulder pain Related to mastomegaly. She has indentation from chronic weight load on bra. Her pain is in the upper back region.  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   3. Hypothyroidism. TSH 0.33 last visit.  No tremor, diarrhea, anxiety, insomnia, sweating.  Recently decreased her synthroid.   Review of Systems No fever, chills, night sweats, weight loss. No chest pain, no SOB. No rash.     Objective:   Physical Exam Filed Vitals:   07/12/11 1334  BP: 136/84  Pulse: 84  Temp: 98.1 F (36.7 C)  TempSrc: Oral  Height: 5\' 3"  (1.6 m)  Weight: 243 lb 8 oz (110.451 kg)  Lungs:  Normal respiratory effort, chest expands symmetrically. Lungs are clear to auscultation, no crackles or wheezes. Heart - Regular rate and rhythm.  No murmurs, gallops or rubs.   Extremities:  No cyanosis, edema, or deformity noted. No tremor noted with outstretched hands.     Assessment & Plan:

## 2011-07-12 NOTE — Assessment & Plan Note (Signed)
Wants to see an ENT surgeon for possible repair. She has chronic sinus trouble that may be related.

## 2011-07-12 NOTE — Assessment & Plan Note (Signed)
Related to chronic mastomegaly Has indention of the skin where her bra is located, which is a sign of chronic weight from over-sized breasts. Referral to plastic surgery for breast reduction

## 2011-07-12 NOTE — Patient Instructions (Signed)
It was great to see you today!  Schedule an appointment to get labs for your thyroid drawn in 2 weeks.   Keep working on weight loss.

## 2011-07-12 NOTE — Assessment & Plan Note (Signed)
C/w cytomel. Now on 100 mcg of synthroid. Will check TSH again in two weeks. No hyperthyroid symptoms.

## 2011-07-19 ENCOUNTER — Other Ambulatory Visit: Payer: Medicare Other

## 2011-07-19 DIAGNOSIS — E039 Hypothyroidism, unspecified: Secondary | ICD-10-CM

## 2011-07-19 LAB — TSH: TSH: 0.132 u[IU]/mL — ABNORMAL LOW (ref 0.350–4.500)

## 2011-07-19 NOTE — Progress Notes (Signed)
TSH DONE TODAY Delorse Shane 

## 2011-08-23 ENCOUNTER — Telehealth: Payer: Self-pay | Admitting: Family Medicine

## 2011-08-23 NOTE — Telephone Encounter (Signed)
Discussed patients complaint of cold hand. I asked her if she wanted to be seen today. She thinks its related to continued video game playing. She will rest her hand and get back in touch with me if it doesn't improve. I advised her about the risks.

## 2011-08-23 NOTE — Telephone Encounter (Signed)
Teresa Mcgee have an issue that she want to discuss with you

## 2011-08-25 ENCOUNTER — Encounter: Payer: Self-pay | Admitting: Family Medicine

## 2011-08-25 ENCOUNTER — Other Ambulatory Visit: Payer: Self-pay | Admitting: Family Medicine

## 2011-08-25 ENCOUNTER — Ambulatory Visit (INDEPENDENT_AMBULATORY_CARE_PROVIDER_SITE_OTHER): Payer: Medicare Other | Admitting: Family Medicine

## 2011-08-25 DIAGNOSIS — M549 Dorsalgia, unspecified: Secondary | ICD-10-CM

## 2011-08-25 DIAGNOSIS — M62838 Other muscle spasm: Secondary | ICD-10-CM

## 2011-08-25 DIAGNOSIS — R200 Anesthesia of skin: Secondary | ICD-10-CM | POA: Insufficient documentation

## 2011-08-25 DIAGNOSIS — E039 Hypothyroidism, unspecified: Secondary | ICD-10-CM

## 2011-08-25 DIAGNOSIS — R209 Unspecified disturbances of skin sensation: Secondary | ICD-10-CM

## 2011-08-25 LAB — TSH: TSH: 2.951 u[IU]/mL (ref 0.350–4.500)

## 2011-08-25 MED ORDER — SIMVASTATIN 40 MG PO TABS: 40.0000 mg | ORAL_TABLET | Freq: Every evening | ORAL | Status: DC

## 2011-08-25 MED ORDER — HYDROCODONE-ACETAMINOPHEN 5-500 MG PO TABS: 1.0000 | ORAL_TABLET | Freq: Three times a day (TID) | ORAL | Status: AC | PRN

## 2011-08-25 MED ORDER — MELOXICAM 7.5 MG PO TABS: 15.0000 mg | ORAL_TABLET | Freq: Every day | ORAL | Status: DC

## 2011-08-25 NOTE — Assessment & Plan Note (Signed)
Isolated right hand numbness. No heart symptoms.  Referral to neurology

## 2011-08-25 NOTE — Assessment & Plan Note (Signed)
Will check TSH today. C/w current meds.

## 2011-08-25 NOTE — Progress Notes (Signed)
  Subjective:    Patient ID: Teresa Mcgee, female    DOB: Mar 11, 1966, 46 y.o.   MRN: [id number]  HPI 1. Numbness and cold right hand. Patient has had 2 weeks of noticeably cold right hand with numbness and tingling and burning pain. She has no history of trauma to the hand/wrist or elbow. She does play a lot of video games and she thinks this is attributing to it. She has normal pulse exam bilaterally. Her right hand is cool compared to her left. She has normal reflexes. Normal sensory exam and strength exam.  2. Hypothyroidism. TSH 1.32 last visit.  No tremor, diarrhea, anxiety, insomnia, sweating.  Recently decreased her synthroid. She is also taking cytomel  Review of Systems No fever, chills, night sweats, weight loss. No chest pain, no SOB. No rash.     Objective:   Physical Exam Filed Vitals:   08/25/11 1529  BP: 156/88  Pulse: 60  Temp: 98.2 F (36.8 C)  TempSrc: Oral  Height: 5\' 3"  (1.6 m)  Weight: 256 lb 4.8 oz (116.257 kg)  SpO2: 99%  Lungs:  Normal respiratory effort, chest expands symmetrically. Lungs are clear to auscultation, no crackles or wheezes. Heart - Regular rate and rhythm.  No murmurs, gallops or rubs.   Extremities:  No cyanosis, edema, or deformity noted. No tremor noted with outstretched hands.     Assessment & Plan:

## 2011-08-25 NOTE — Patient Instructions (Signed)
It was great to see you today!  Schedule an appointment to see me as needed.  I have referred you to a neurologist for your right hand numbness for nerve testing.

## 2011-08-25 NOTE — Telephone Encounter (Signed)
Refill request

## 2011-08-25 NOTE — Assessment & Plan Note (Signed)
Will give another course of vicodin.

## 2011-10-21 ENCOUNTER — Other Ambulatory Visit: Payer: Self-pay | Admitting: Family Medicine

## 2011-10-22 NOTE — Telephone Encounter (Signed)
Refill request

## 2011-11-28 ENCOUNTER — Encounter: Payer: Self-pay | Admitting: Sports Medicine

## 2011-11-28 ENCOUNTER — Ambulatory Visit (INDEPENDENT_AMBULATORY_CARE_PROVIDER_SITE_OTHER): Payer: Medicare Other | Admitting: Sports Medicine

## 2011-11-28 VITALS — BP 152/94 | HR 84 | Temp 97.8°F | Ht 63.0 in | Wt 264.8 lb

## 2011-11-28 DIAGNOSIS — L2989 Other pruritus: Secondary | ICD-10-CM | POA: Insufficient documentation

## 2011-11-28 DIAGNOSIS — M62838 Other muscle spasm: Secondary | ICD-10-CM

## 2011-11-28 DIAGNOSIS — L298 Other pruritus: Secondary | ICD-10-CM

## 2011-11-28 DIAGNOSIS — M549 Dorsalgia, unspecified: Secondary | ICD-10-CM

## 2011-11-28 MED ORDER — HYDROXYZINE HCL 10 MG PO TABS: 10.0000 mg | ORAL_TABLET | Freq: Three times a day (TID) | ORAL | Status: AC | PRN

## 2011-11-28 MED ORDER — MELOXICAM 7.5 MG PO TABS: 15.0000 mg | ORAL_TABLET | Freq: Every day | ORAL | Status: DC

## 2011-11-28 NOTE — Assessment & Plan Note (Signed)
Patient denies any falls or red flag symptoms, Will refill NSAID and have followup with PCP to address opioid prescription.

## 2011-11-28 NOTE — Progress Notes (Signed)
HPI:  Teresa Mcgee is a 46 y.o. female presenting today for evaluation of itching and rashes.  She notes that these have been present for 3 days.  Has been taking Benadryl has not helped.  No changes in lotions, creams, foods, detergents, soaps, or other known irritants.  Has had this on occasion 3-4 times in the past most recently was approximately 2 years ago.  Reports hydroxyzine helps.  The itching began before the rash   ROS  no fevers, no chills, Worsening back pain above her baseline is out of her Mobic, no weakness no falls  Past Medical Hx Reviewed: yes significant for hypothyroidism and chronic back pain Medications Reviewed: yes Family History Reviewed: yes  PE: GENERAL:  Adult obese African American female.  Examined in [facility].  Sitting comfortably on exam room table  In no discomfort; norespiratory distress.   PSYCH: Alert and appropriately interactive  HNEENT: AT/[place], MMM, no scleral icterus, EOMi EXTREMITIES: Moves all 4 extremities spontaneously, warm well perfused, no edema >SKIN: Markedly dry skin with dry flaky scales with secondary excoriations over bilateral upper extremities, bilateral lower extremity, and areas that are within reach on her back,, acanthosis nigricans,

## 2011-11-28 NOTE — Patient Instructions (Signed)
It was nice to see you today.  You have a condition called winter time eczema.  I would like for you to change soaps to [facility] with moisturizer and to purchase Eucerin Cream (Generic version is fine).  Apply the cream up to 3 times per day and especially immediately following a shower.    I have sent in a prescription for hydroxyzine for your itching and have refilled your Mobic for your back pain.  Please plan to return to see Dr. Rivka Safer to further discuss your back pain in 2-3 weeks.  If you need anything prior to seeing Dr. Rivka Safer please call the clinic.

## 2011-11-28 NOTE — Assessment & Plan Note (Signed)
Patient given skin regimen per AVS.  Also given Atarax for itching.  Likely contribution from thyroid on last check was normal.  Will have followup with PCP

## 2011-12-26 ENCOUNTER — Ambulatory Visit (INDEPENDENT_AMBULATORY_CARE_PROVIDER_SITE_OTHER): Payer: Medicare Other | Admitting: Family Medicine

## 2011-12-26 ENCOUNTER — Encounter: Payer: Self-pay | Admitting: Family Medicine

## 2011-12-26 VITALS — BP 165/105 | HR 89 | Temp 97.8°F | Ht 63.0 in | Wt 268.1 lb

## 2011-12-26 DIAGNOSIS — S39012A Strain of muscle, fascia and tendon of lower back, initial encounter: Secondary | ICD-10-CM

## 2011-12-26 DIAGNOSIS — S335XXA Sprain of ligaments of lumbar spine, initial encounter: Secondary | ICD-10-CM

## 2011-12-26 MED ORDER — HYDROCODONE-ACETAMINOPHEN 5-500 MG PO TABS: 1.0000 | ORAL_TABLET | Freq: Three times a day (TID) | ORAL | Status: AC | PRN

## 2011-12-26 NOTE — Assessment & Plan Note (Signed)
Acute strain on chronic lower back pain. vicodin - short course, no refills. Referral to sports medicine for second opinion. Trigger point injection performed today with bupivicaine.

## 2011-12-26 NOTE — Patient Instructions (Signed)
Meds ordered this encounter  Medications  . HYDROcodone-acetaminophen (VICODIN) 5-500 MG per tablet    Sig: Take 1 tablet by mouth every 8 (eight) hours as needed for pain.    Dispense:  20 tablet    Refill:  0   I have referred you to sports medicine to evaluate your lower back and right knee.   I have given you a trigger point injection today in your lower back muscle.

## 2011-12-26 NOTE — Progress Notes (Signed)
  Subjective:   Patient ID: Teresa Mcgee, female DOB: Apr 25, 1966 46 y.o. MRN: [id number] HPI:  1. Acute lower back pain  Onset: has been acute on chronic Time period of: 1 week(s).  Severity is described as moderate to severe.  Course of her symptoms over time is recurrent. Aggravating: bending forward, trying to get up after prolonged sitting.  Alleviating: tylenol not helping, mobic not helping.  Associated sx/sn: no numbness or tingling in legs, no shooting nerve pain down legs. No urinary incontinence. No gait abnormality.  History  Substance Use Topics  . Smoking status: Former Smoker -- 0.3 packs/day    Types: Cigarettes  . Smokeless tobacco: Not on file  . Alcohol Use: Not on file   Review of Systems: Pertinent items are noted in HPI.  Labs Reviewed: none    Objective:   Filed Vitals:   12/26/11 1414  BP: 165/105  Pulse: 89  Temp: 97.8 F (36.6 C)  TempSrc: Oral  Height: 5\' 3"  (1.6 m)  Weight: 268 lb 1.6 oz (121.609 kg)   Physical Exam: General: obese AAF, in mild discomfort Back: tender to palpation of right paraspinal muscle in lumbar region. No spinal ttp. FROM. Slowness with pending over forward and getting up from seated position. Straight leg caused lower back pain, but no radicular symptoms.   Assessment & Plan:

## 2011-12-27 NOTE — Progress Notes (Signed)
Procedure Note: Trigger point injection of right lumbar paraspinal muscle  Informed Consent Obtained for above. All Risks, benefits, alternative, complications reviewed with patient and understood. Time Out performed. Using topical anesthetic spray local anesthesia was obtained. Using a 25 gauge needle 1 cc of bupivicaine 0.25% was injected into the paraspinal lumbar muscle.  The patient tolerated the procedure well. No complications occurred. Patient counseled on post-operative care.

## 2011-12-28 ENCOUNTER — Ambulatory Visit (INDEPENDENT_AMBULATORY_CARE_PROVIDER_SITE_OTHER): Payer: Medicare Other | Admitting: Family Medicine

## 2011-12-28 DIAGNOSIS — E669 Obesity, unspecified: Secondary | ICD-10-CM

## 2011-12-28 NOTE — Patient Instructions (Signed)
To eat more fresh fruits and vegetables: Buy your food from: [employer] - [address] [place] [address] Saturday mornings till noon or one. Farmers markets  Dietary changes:  Breakfast:  White wheat bread with egg, (no bacon) Fruit if available  Lunch:  Salad: (be careful with salad dressing) Chicken or boiled egg, tuna on special at grocery store.   Dinner: Public librarian with vegetables and meat.  Only drink water (no juice, no regular soda) this is the most important first step.   Snacks:  Healthy options:  Vegetables and fruits.   Please fill out the daily log for goals.

## 2011-12-28 NOTE — Progress Notes (Signed)
  Subjective:   Patient ID: Teresa Mcgee, female DOB: 1966/01/13 46 y.o. MRN: [id number] HPI:  1. Obesity  Diet pattern: One meal a day, goes all day without eating.  Dinner is the main meal. Steak or check stir-fry - rice, soya sauce, fruit. Fried chicken/macaroni and cheese.   3 times a week has breakfast.   Never has lunch.    Snacks: grilled cheese/fruit, bowl of cereal with milk (whole) Has snacks 11pm usually and 4 pm.   Drink: juice/pepsi regular - 48 ounces per day  Water: 48 ounces of water a day.   24 hour recall:  Breakfast: Bacon and egg sandwich Coffee with milk/sugar (3 tsp)  Lunch: None Water  Dinner: Chicken fajitas - 2 - chicken, onions, peppers, cheese coolaid strawberry-kiwi 16 ounces  Snacks: Bowl of fruit - mixed fruit cocktail 8 pm 8 ounces.  Calorie total:   Exercise: Walk from home to group home 10 minutes each way.  Cleaning around house.  Goals: Wants to be able to bend over without help Personal hygiene moving arms towards back. Walk without struggling - wobbling from side to side.  Doesn't want to have to use cane.  - eat more fresh fruit, vegetables - ear leaner meats.  - eating 3 meals a day with 2-3 snacks per day.   Obstacles: Back pain - cannot exercise because of pain Including walking . Diet: financially Wants to drink more water, but it tastes foul from the well.    Diet pattern   History  Substance Use Topics  . Smoking status: Former Smoker -- 0.3 packs/day    Types: Cigarettes  . Smokeless tobacco: Not on file  . Alcohol Use: Not on file    Review of Systems: See above.   Labs Reviewed: no diabetes evident. Thyroid labs reviewed.  Reviewed Chart Review for last notes.     Objective:  There were no vitals filed for this visit. Physical Exam: General: aaf, nad, pleasant  Assessment & Plan:  Goals: Wants to be able to bend over without help Personal hygiene moving arms towards back. Walk without  struggling - wobbling from side to side.  Doesn't want to have to use cane.  - eat more fresh fruit, vegetables - ear leaner meats.  - eating 3 meals a day with 2-3 snacks per day.   Obstacles: Back pain - cannot exercise because of pain Including walking . Diet: financially Wants to drink more water, but it tastes foul from the well.    Diet pattern

## 2011-12-28 NOTE — Assessment & Plan Note (Signed)
Spent 45 minutes with patient taking about goals and obstacles. Gave patient goals sheet with one month follow up.  This will help with her sleep apnea, arthritis, htn.

## 2012-01-11 ENCOUNTER — Encounter (HOSPITAL_COMMUNITY): Payer: Self-pay | Admitting: *Deleted

## 2012-01-11 ENCOUNTER — Emergency Department (HOSPITAL_COMMUNITY): Payer: Medicare Other

## 2012-01-11 ENCOUNTER — Observation Stay (HOSPITAL_COMMUNITY)
Admission: AD | Admit: 2012-01-11 | Discharge: 2012-01-12 | Disposition: A | Discharge status: Home / Self Care | Payer: Medicare Other | Attending: Family Medicine | Admitting: Family Medicine

## 2012-01-11 ENCOUNTER — Ambulatory Visit: Payer: Medicare Other | Admitting: Family Medicine

## 2012-01-11 DIAGNOSIS — F329 Major depressive disorder, single episode, unspecified: Secondary | ICD-10-CM | POA: Diagnosis present

## 2012-01-11 DIAGNOSIS — I10 Essential (primary) hypertension: Secondary | ICD-10-CM | POA: Diagnosis present

## 2012-01-11 DIAGNOSIS — M62838 Other muscle spasm: Secondary | ICD-10-CM

## 2012-01-11 DIAGNOSIS — K5900 Constipation, unspecified: Secondary | ICD-10-CM | POA: Diagnosis not present

## 2012-01-11 DIAGNOSIS — E785 Hyperlipidemia, unspecified: Secondary | ICD-10-CM | POA: Insufficient documentation

## 2012-01-11 DIAGNOSIS — R0789 Other chest pain: Principal | ICD-10-CM | POA: Insufficient documentation

## 2012-01-11 DIAGNOSIS — F32A Depression, unspecified: Secondary | ICD-10-CM

## 2012-01-11 DIAGNOSIS — R079 Chest pain, unspecified: Secondary | ICD-10-CM | POA: Diagnosis present

## 2012-01-11 DIAGNOSIS — K219 Gastro-esophageal reflux disease without esophagitis: Secondary | ICD-10-CM | POA: Diagnosis present

## 2012-01-11 DIAGNOSIS — F3289 Other specified depressive episodes: Secondary | ICD-10-CM | POA: Diagnosis present

## 2012-01-11 DIAGNOSIS — R0602 Shortness of breath: Secondary | ICD-10-CM | POA: Insufficient documentation

## 2012-01-11 HISTORY — DX: Zoster without complications: B02.9

## 2012-01-11 HISTORY — DX: Sleep apnea, unspecified: G47.30

## 2012-01-11 HISTORY — DX: Shortness of breath: R06.02

## 2012-01-11 HISTORY — DX: Personal history of other diseases of the digestive system: Z87.19

## 2012-01-11 HISTORY — DX: Bell's palsy: G51.0

## 2012-01-11 HISTORY — DX: Hypothyroidism, unspecified: E03.9

## 2012-01-11 HISTORY — DX: Unspecified osteoarthritis, unspecified site: M19.90

## 2012-01-11 LAB — CBC
HCT: 38.4 % (ref 36.0–46.0)
HCT: 39.3 % (ref 36.0–46.0)
Hemoglobin: 12.8 g/dL (ref 12.0–15.0)
Hemoglobin: 13.1 g/dL (ref 12.0–15.0)
MCH: 30.5 pg (ref 26.0–34.0)
MCHC: 33.3 g/dL (ref 30.0–36.0)
RBC: 4.28 MIL/uL (ref 3.87–5.11)
WBC: 4.9 10*3/uL (ref 4.0–10.5)

## 2012-01-11 LAB — CARDIAC PANEL(CRET KIN+CKTOT+MB+TROPI)
CK, MB: 5.6 ng/mL — ABNORMAL HIGH (ref 0.3–4.0)
Relative Index: 0.9 (ref 0.0–2.5)
Relative Index: 0.9 (ref 0.0–2.5)
Relative Index: 0.9 (ref 0.0–2.5)
Relative Index: 0.9 (ref 0.0–2.5)
Relative Index: 0.9 (ref 0.0–2.5)
Total CK: 645 U/L — ABNORMAL HIGH (ref 7–177)
Total CK: 648 U/L — ABNORMAL HIGH (ref 7–177)
Total CK: 608 U/L — ABNORMAL HIGH (ref 7–177)
Total CK: 586 U/L — ABNORMAL HIGH (ref 7–177)
Troponin I: <0.3 ng/mL (ref <0.30)
Troponin I: <0.3 ng/mL (ref <0.30)

## 2012-01-11 LAB — BASIC METABOLIC PANEL
BUN: 10 mg/dL (ref 6–23)
Calcium: 9.3 mg/dL (ref 8.4–10.5)
GFR calc non Af Amer: 85 mL/min — ABNORMAL LOW (ref >90)
Glucose, Bld: 107 mg/dL — ABNORMAL HIGH (ref 70–99)

## 2012-01-11 LAB — CREATININE, SERUM
GFR calc Af Amer: >90 mL/min (ref >90)
GFR calc non Af Amer: >90 mL/min (ref >90)

## 2012-01-11 MED ORDER — LEVOTHYROXINE SODIUM 100 MCG PO TABS
100.0000 ug | ORAL_TABLET | Freq: Every day | ORAL | Status: DC
  Filled 2012-01-11 (×2): qty 1

## 2012-01-11 MED ORDER — NITROGLYCERIN 0.4 MG SL SUBL: 0.4000 mg | SUBLINGUAL_TABLET | SUBLINGUAL | Status: DC | PRN

## 2012-01-11 MED ORDER — SIMVASTATIN 40 MG PO TABS: 40.0000 mg | ORAL_TABLET | Freq: Every evening | ORAL | Status: DC

## 2012-01-11 MED ORDER — ENALAPRIL MALEATE 5 MG PO TABS
5.0000 mg | ORAL_TABLET | Freq: Every day | ORAL | Status: DC
  Administered 2012-01-11 – 2012-01-12 (×2): 5 mg via ORAL
  Filled 2012-01-11 (×3): qty 1

## 2012-01-11 MED ORDER — MELOXICAM 15 MG PO TABS
15.0000 mg | ORAL_TABLET | Freq: Every day | ORAL | Status: DC
  Administered 2012-01-11 – 2012-01-12 (×2): 15 mg via ORAL
  Filled 2012-01-11 (×2): qty 1

## 2012-01-11 MED ORDER — ENOXAPARIN SODIUM 40 MG/0.4ML ~~LOC~~ SOLN
40.0000 mg | SUBCUTANEOUS | Status: DC
  Administered 2012-01-11 – 2012-01-12 (×2): 40 mg via SUBCUTANEOUS
  Filled 2012-01-11 (×3): qty 0.4

## 2012-01-11 MED ORDER — MORPHINE SULFATE 4 MG/ML IJ SOLN
4.0000 mg | Freq: Once | INTRAMUSCULAR | Status: AC
  Administered 2012-01-11: 4 mg via INTRAVENOUS
  Filled 2012-01-11: qty 1

## 2012-01-11 MED ORDER — ASPIRIN 81 MG PO CHEW
324.0000 mg | CHEWABLE_TABLET | Freq: Once | ORAL | Status: AC
  Administered 2012-01-11: 324 mg via ORAL
  Filled 2012-01-11: qty 4

## 2012-01-11 MED ORDER — LIOTHYRONINE SODIUM 25 MCG PO TABS
25.0000 ug | ORAL_TABLET | Freq: Every day | ORAL | Status: DC
  Administered 2012-01-11 – 2012-01-12 (×2): 25 ug via ORAL
  Filled 2012-01-11 (×3): qty 1

## 2012-01-11 MED ORDER — VENLAFAXINE HCL ER 75 MG PO CP24
75.0000 mg | ORAL_CAPSULE | Freq: Every day | ORAL | Status: DC
  Administered 2012-01-11 – 2012-01-12 (×2): 75 mg via ORAL
  Filled 2012-01-11 (×3): qty 1

## 2012-01-11 MED ORDER — IOHEXOL 350 MG/ML SOLN
100.0000 mL | Freq: Once | INTRAVENOUS | Status: AC | PRN
  Administered 2012-01-11: 100 mL via INTRAVENOUS

## 2012-01-11 MED ORDER — ATORVASTATIN CALCIUM 20 MG PO TABS
20.0000 mg | ORAL_TABLET | Freq: Every day | ORAL | Status: DC
  Administered 2012-01-11: 20 mg via ORAL
  Filled 2012-01-11 (×2): qty 1

## 2012-01-11 MED ORDER — GI COCKTAIL ~~LOC~~
30.0000 mL | Freq: Once | ORAL | Status: AC
  Administered 2012-01-11: 30 mL via ORAL

## 2012-01-11 MED ORDER — ENALAPRIL-HYDROCHLOROTHIAZIDE 5-12.5 MG PO TABS: 1.0000 | ORAL_TABLET | Freq: Every day | ORAL | Status: DC

## 2012-01-11 MED ORDER — ASPIRIN EC 81 MG PO TBEC
81.0000 mg | DELAYED_RELEASE_TABLET | Freq: Every day | ORAL | Status: DC
  Administered 2012-01-12: 81 mg via ORAL
  Filled 2012-01-11: qty 1

## 2012-01-11 MED ORDER — HYDROCHLOROTHIAZIDE 12.5 MG PO CAPS
12.5000 mg | ORAL_CAPSULE | Freq: Every day | ORAL | Status: DC
  Administered 2012-01-11 – 2012-01-12 (×2): 12.5 mg via ORAL
  Filled 2012-01-11 (×3): qty 1

## 2012-01-11 MED ORDER — ONDANSETRON HCL 4 MG/2ML IJ SOLN: 4.0000 mg | Freq: Four times a day (QID) | INTRAMUSCULAR | Status: DC | PRN

## 2012-01-11 MED ORDER — GI COCKTAIL ~~LOC~~
30.0000 mL | Freq: Three times a day (TID) | ORAL | Status: DC | PRN
  Filled 2012-01-11: qty 30

## 2012-01-11 MED ORDER — ACETAMINOPHEN 325 MG PO TABS
650.0000 mg | ORAL_TABLET | ORAL | Status: DC | PRN
  Administered 2012-01-11: 650 mg via ORAL
  Filled 2012-01-11: qty 2

## 2012-01-11 MED ORDER — AMLODIPINE BESYLATE 5 MG PO TABS
5.0000 mg | ORAL_TABLET | Freq: Every day | ORAL | Status: DC
  Administered 2012-01-11 – 2012-01-12 (×2): 5 mg via ORAL
  Filled 2012-01-11 (×3): qty 1

## 2012-01-11 NOTE — ED Notes (Signed)
Pt is resting comfortably, pt aware that admit bed is ready and report will be called at 7:45. Pt has no needs at this time.

## 2012-01-11 NOTE — Care Management Note (Unsigned)
    Page 1 of 1   01/12/2012     11:12:47 AM   CARE MANAGEMENT NOTE 01/11/2012  Patient:  Teresa Mcgee, Teresa Mcgee   Account Number:  [id number]  Date Initiated:  01/11/2012  Documentation initiated by:  GRAVES-BIGELOW,Keaunna Skipper  Subjective/Objective Assessment:   Pt admitted with cp. Pt has hx of Htn,  hyperlipidemia, and GERD     Action/Plan:   CM will continue to monitor for disposition needs.   Anticipated DC Date:  01/13/2012   Anticipated DC Plan:  HOME/SELF CARE      DC Planning Services  CM consult      Choice offered to / List presented to:             Status of service:  In process, will continue to follow Medicare Important Message given?   (If response is "NO", the following Medicare IM given date fields will be blank) Date Medicare IM given:   Date Additional Medicare IM given:    Discharge Disposition:    Per UR Regulation:  Reviewed for med. necessity/level of care/duration of stay  If discussed at Long Length of Stay Meetings, dates discussed:    Comments:

## 2012-01-11 NOTE — ED Notes (Signed)
Pt c/o sternal pain that began at 1030 pm while she was watching tv.  Pain is a pressure.  Pain radiates to back.  Pt c/o sob.

## 2012-01-11 NOTE — H&P (Signed)
Family Medicine Teaching [facility] Admission History and Physical  Patient name: Teresa Mcgee Medical record number: [id number] Date of birth: 10-25-1965 Age: 46 y.o. Gender: female  Primary Care Provider: Edd Arbour, MD, MD  Chief Complaint: Chest pain History of Present Illness: Teresa Mcgee is a 46 y.o. year old female presenting with chest pain centrally located and radiating to the back. It started at 10:30 PM while the patient was lying in bed watching TV. It was minimal and continued to worsen. Pain was worsened by lying on her back and by walking. Patient originally thought it was indigestion, but this pain was much worse than previous indigestion pain. No medications were taken, and she denies a history of chest pain. Other symptoms include nausea but no vomiting, bloating. Denies difficulty breathing, sweating, and losing consciousness. The pain didn't subside until she received morphine in the ED. Currently, she rates her pain as a 7/10.   She reports undergoing chemical stress test about 5 years ago, which was abnormal. She underwent a cardiac catheterization but was told she had no blockages. She does not see a cardiologist routinely.   Patient Active Problem List  Diagnoses  . HYPOTHYROIDISM  . HYPERCHOLESTEROLEMIA  . TOBACCO ABUSE  . DEPRESSION  . HYPERTENSION, BENIGN ESSENTIAL  . GERD  . BACK PAIN, CHRONIC  . SLEEP APNEA  . Fatigue  . Nasal septal perforation  . Muscle spasms of neck  . Nasal mucositis (ulcerative)  . Numbness and tingling in right hand  . Xerotic eczema  . Strain of lumbar paraspinous muscle  . Obesity (BMI 35.0-39.9 without comorbidity)  . Constipation   Past Medical History: Past Medical History  Diagnosis Date  . Hypertension   . Hyperlipidemia   . GERD (gastroesophageal reflux disease)   . Bell's palsy may 2012  Residual right-sided facial droop  Past Surgical History: Past Surgical History  Procedure Date  .  Thyroidectomy 1998  . Cesarian     3x  . Partial hysterectomy 2000    abnormal uterine bleeding   Social History: History   Social History  . Marital Status: Married    Spouse Name: N/A    Number of Children: N/A  . Years of Education: N/A   Occupational History  . disabled     back   Social History Main Topics  . Smoking status: Current Everyday Smoker -- 0.3 packs/day for 20 years    Types: Cigarettes  . Smokeless tobacco: None   Comment: used to smoke 1.5 - 2 PPD  . Alcohol Use: No  . Drug Use: No  . Sexually Active: None   Other Topics Concern  . None   Social History Narrative  . None   Family History: Family History  Problem Relation Age of Onset  . Heart attack Father 27    deceased  . Diabetes Brother   . Stroke Brother 25    twice  . Stroke Sister 22  . Diabetes Sister   . Heart attack Sister 9  . Heart attack Brother 39   Allergies: No Known Allergies  Current Facility-Administered Medications  Medication Dose Route Frequency Provider Last Rate Last Dose  . aspirin chewable tablet 324 mg  324 mg Oral Once Benny Lennert, MD   324 mg at 01/11/12 0118  . iohexol (OMNIPAQUE) 350 MG/ML injection 100 mL  100 mL Intravenous Once PRN Medication Radiologist, MD   100 mL at 01/11/12 0527  . morphine 4 MG/ML injection 4 mg  4 mg  Intravenous Once Benny Lennert, MD   4 mg at 01/11/12 0258  . TDaP (BOOSTRIX) injection 0.5 mL  0.5 mL Intramuscular Once Edd Arbour, MD       Current Outpatient Prescriptions  Medication Sig Dispense Refill  . amLODipine (NORVASC) 5 MG tablet Take 1 tablet (5 mg total) by mouth daily.  30 tablet  11  . Enalapril-Hydrochlorothiazide 5-12.5 MG per tablet Take 1 tablet by mouth daily.  90 tablet  3  . levothyroxine (SYNTHROID) 100 MCG tablet Take 1 tablet (100 mcg total) by mouth daily.  30 tablet  11  . liothyronine (CYTOMEL) 25 MCG tablet TAKE 1 TABLET BY MOUTH DAILY  30 tablet  0  . meloxicam (MOBIC) 7.5 MG tablet Take 2  tablets (15 mg total) by mouth daily.  30 tablet  3  . simvastatin (ZOCOR) 40 MG tablet Take 1 tablet (40 mg total) by mouth every evening.  90 tablet  6  . venlafaxine (EFFEXOR XR) 75 MG 24 hr capsule Take 1 capsule (75 mg total) by mouth daily.  30 capsule  2   Review Of Systems: Per HPI with the following additions:  Denies fevers, chills Denies headache, lightheadedness Denies nausea, abdominal pain Denies palpitations Denies swelling  Otherwise 12 point review of systems was performed and was unremarkable.  Physical Exam: BP 165/92  Pulse 68  Temp(Src) 98.5 F (36.9 C) (Oral)  Resp 16  Ht 5\' 3"  (1.6 m)  Wt 256 lb (116.121 kg)  BMI 45.35 kg/m2  SpO2 100% General: alert, appears stated age, no distress, morbidly obese and African-American HEENT: sclera clear, anicteric, oropharynx clear, no lesions, neck supple with midline trachea, thyroid without masses and no neck LAD; pupils constricted by reactive Heart: S1, S2 normal, no murmur, rub or gallop, regular rate and rhythm Lungs: clear to auscultation, no wheezes or rales, unlabored breathing and poor aeration, decreased breath sounds at bases  Chest: mild TTP substernum Abdomen: abdomen is soft without significant tenderness, masses, organomegaly or guarding, no hernias noted Extremities: extremities normal, atraumatic, no cyanosis or edema and Homans sign is negative, no sign of DVT Skin:no rashes, acanthosis nigricans back of neck  Neurology: normal without focal findings, mental status, speech normal, alert and oriented x3, PERLA, muscle tone and strength normal and symmetric and right-sided facial droop but eyebrow raise symmetric; 4-5/5 strength upper and lower extremities   Gait: deferred; patient uses a walker occasionally at home Psych: appropriate to questions; engaged; not anxious or depressed-appearing  Labs and Imaging: Results for orders placed during the hospital encounter of 01/11/12 (from the past 24 hour(s))    CBC     Status: Normal   Collection Time   01/11/12 12:50 AM      Component Value Range   WBC 5.8  4.0 - 10.5 (K/uL)   RBC 4.19  3.87 - 5.11 (MIL/uL)   Hemoglobin 12.8  12.0 - 15.0 (g/dL)   HCT 69.6  29.5 - 28.4 (%)   MCV 91.6  78.0 - 100.0 (fL)   MCH 30.5  26.0 - 34.0 (pg)   MCHC 33.3  30.0 - 36.0 (g/dL)   RDW 13.2  44.0 - 10.2 (%)   Platelets 153  150 - 400 (K/uL)  BASIC METABOLIC PANEL     Status: Abnormal   Collection Time   01/11/12 12:50 AM      Component Value Range   Sodium 139  135 - 145 (mEq/L)   Potassium 3.3 (*) 3.5 - 5.1 (mEq/L)  Chloride 101  96 - 112 (mEq/L)   CO2 27  19 - 32 (mEq/L)   Glucose, Bld 107 (*) 70 - 99 (mg/dL)   BUN 10  6 - 23 (mg/dL)   Creatinine, Ser 8.29  0.50 - 1.10 (mg/dL)   Calcium 9.3  8.4 - 56.2 (mg/dL)   GFR calc non Af Amer 85 (*) >90 (mL/min)   GFR calc Af Amer >90  >90 (mL/min)  PRO B NATRIURETIC PEPTIDE     Status: Normal   Collection Time   01/11/12 12:50 AM      Component Value Range   Pro B Natriuretic peptide (BNP) 23.5  0 - 125 (pg/mL)  POCT I-STAT TROPONIN I     Status: Normal   Collection Time   01/11/12 12:58 AM      Component Value Range   Troponin i, poc 0.01  0.00 - 0.08 (ng/mL)   Comment 3           D-DIMER, QUANTITATIVE     Status: Abnormal   Collection Time   01/11/12  2:37 AM      Component Value Range   D-Dimer, Quant 0.69 (*) 0.00 - 0.48 (ug/mL-FEU)   Chest X-ray: no acute cardiopulmonary disease   CTA Chest: No evidence of significant pulmonary embolus. Nonspecific low attenuation focus within the right infraspinatus muscle could represent intramuscular hematoma or mass lesion. Consider MRI for further evaluation.  EKG: LVH, no ST changes or T-wave abnormalities; prolonged QTc 465  Assessment and Plan: Teresa Mcgee is a 46 y.o. year old female with a history of hypertension, hyperlipidemia, and GERD and who is morbidly obese and who, per the patient, had an abnormal chemical stress test about 5 years ago but  a normal catheterization who presents with acute chest pain that started last night.  CV Chest pain--left-sided, not associated with activity. However, she is at significant risk for CAD with her family history and personal history of hypertension, morbid obesity, and hyperlipidemia. She denies history of diabetes, however, she does have acantosis nigricans. ECG does not show any worrisome changes and her POC troponins were negative.  D-dimer elevated but CTA negative for PE. D/Dx: ACS, GERD, anxiety, pericarditis  History of hyperlipidemia History of hypertension  -Will monitor on telemetry and repeat ECG later today.  -Will risk stratify: HgbA1c, TSH, FLP   -History of hypothyroidism. On Synthroid 100 mcg, liothyronin 25 mcg>>>Continue.    -History of hyperlipidemia. Continue home Zocor.  -Patient received full-dose aspirin and morphine. She reports significant chest pain, however, she was sleeping in her room and appeared comfortable during the interview. She reports some improvement with the medications. NTG was not given.    -Baby aspirin daily, morphine prn, NTG now, oxygen   -Will start low-dose beta-blocker -Will try GI cocktail now in case of GERD -Will cycle cardiac enzymes and check 2D-ECHO. Patient may need cardiac consultation for dobutamine stress test during hospitalization; will discuss with team regarding this.  -Blood pressure is elevated.   -Home medications: Norvasc 5, enalapril 5, HCTZ 12.5>>>Continue  PSYCH History of chronic back pain  History of depression/anxiety -Continue home Effexor 75 -Patient does not want K-pad for her back pain.    -Continue home meloxicam   -Tylenol prn   FEN/GI -Diet: heart-healthy diet  -IVF: SL  PPx -SCD for DVT PPx  Dispo: pending clinical improvement    Teresa Amble, MD, MPH PGY-1 [facility] Geoffry Paradise, MD PGY-2 [phone]

## 2012-01-11 NOTE — H&P (Signed)
Seen and examined.  Discussed with Dr. Madolyn Frieze.  Agree with her management.  Briefly, 46 yo female with chest pain atypical for cardiac origin and physical exam more consistent with chest wall pain.  She has an impressive number of risk factors and an even more impressive family history of early death by stroke and MI.  As such, we have admitted for rule out MI.  Will ask cards to see because I believe she will need a (outpatient?) nuclear med stress test.

## 2012-01-11 NOTE — ED Notes (Signed)
Patient assisted to bathroom. States her pain is 6/10 at present. No increase of pain with movement or walking no increased sob. Husband at bedside.

## 2012-01-11 NOTE — Plan of Care (Signed)
PCP NOTE 3:31 PM Glendon Fiser   Patient seen and examined. Agree with Inpatient team management.   Patient c/o: nothing. Chest pain resolved. PE/pna/MI ruled out.   Filed Vitals:   01/11/12 0217 01/11/12 0645 01/11/12 0830 01/11/12 0908  BP: 170/98 165/92 136/95   Pulse: 68  58   Temp:  98.5 F (36.9 C) 96.8 F (36 C)   TempSrc:  Oral Oral   Resp: 16 16 16    Height:      Weight:      SpO2: 98% 100% 98% 100%    PE: see Primary team note  A/P:  Problem list and management per primary team  1. Social visit - discussed care and comfort with patient - patient doing better.   - Will follow with you  Thank you for the excellent care of this patient.  Edd Arbour MD

## 2012-01-11 NOTE — ED Provider Notes (Addendum)
History     CSN: [id number]  Arrival date & time 01/11/12  0034   First MD Initiated Contact with Patient 01/11/12 0222      Chief Complaint  Patient presents with  . Chest Pain    (Consider location/radiation/quality/duration/timing/severity/associated sxs/prior treatment) Patient is a 46 y.o. female presenting with chest pain. The history is provided by the patient (the pt complains of chest pressure). No language interpreter was used.  Chest Pain The chest pain began 1 - 2 hours ago. Chest pain occurs frequently. The chest pain is improving. The pain is associated with stress. At its most intense, the pain is at 4/10. The pain is currently at 2/10. The severity of the pain is moderate. The quality of the pain is described as aching. The pain does not radiate. Chest pain is worsened by stress. Pertinent negatives for primary symptoms include no fatigue, no cough and no abdominal pain. She tried nothing for the symptoms. Risk factors include obesity.  Pertinent negatives for past medical history include no seizures.     Past Medical History  Diagnosis Date  . Hypertension   . Hyperlipidemia   . GERD (gastroesophageal reflux disease)     Past Surgical History  Procedure Date  . Thyroidectomy 1998  . Cesarian     No family history on file.  History  Substance Use Topics  . Smoking status: Current Everyday Smoker -- 0.3 packs/day    Types: Cigarettes  . Smokeless tobacco: Not on file  . Alcohol Use: No    OB History    Grav Para Term Preterm Abortions TAB SAB Ect Mult Living                  Review of Systems  Constitutional: Negative for fatigue.  HENT: Negative for congestion, sinus pressure and ear discharge.   Eyes: Negative for discharge.  Respiratory: Negative for cough.   Cardiovascular: Positive for chest pain.  Gastrointestinal: Negative for abdominal pain and diarrhea.  Genitourinary: Negative for frequency and hematuria.  Musculoskeletal: Negative  for back pain.  Skin: Negative for rash.  Neurological: Negative for seizures and headaches.  Hematological: Negative.   Psychiatric/Behavioral: Negative for hallucinations.    Allergies  Review of patient's allergies indicates no known allergies.  Home Medications   Current Outpatient Rx  Name Route Sig Dispense Refill  . AMLODIPINE BESYLATE 5 MG PO TABS Oral Take 1 tablet (5 mg total) by mouth daily. 30 tablet 11  . ENALAPRIL-HYDROCHLOROTHIAZIDE 5-12.5 MG PO TABS Oral Take 1 tablet by mouth daily. 90 tablet 3  . LEVOTHYROXINE SODIUM 100 MCG PO TABS Oral Take 1 tablet (100 mcg total) by mouth daily. 30 tablet 11  . LIOTHYRONINE SODIUM 25 MCG PO TABS  TAKE 1 TABLET BY MOUTH DAILY 30 tablet 0  . MELOXICAM 7.5 MG PO TABS Oral Take 2 tablets (15 mg total) by mouth daily. 30 tablet 3  . SIMVASTATIN 40 MG PO TABS Oral Take 1 tablet (40 mg total) by mouth every evening. 90 tablet 6  . VENLAFAXINE HCL ER 75 MG PO CP24 Oral Take 1 capsule (75 mg total) by mouth daily. 30 capsule 2    BP 170/98  Pulse 68  Temp(Src) 98.3 F (36.8 C) (Oral)  Resp 16  Ht 5\' 3"  (1.6 m)  Wt 256 lb (116.121 kg)  BMI 45.35 kg/m2  SpO2 98%  Physical Exam  Constitutional: She is oriented to person, place, and time. She appears well-developed.  HENT:  Head: Normocephalic  and atraumatic.  Eyes: Conjunctivae and EOM are normal. No scleral icterus.  Neck: Neck supple. No thyromegaly present.  Cardiovascular: Normal rate and regular rhythm.  Exam reveals no gallop and no friction rub.   No murmur heard. Pulmonary/Chest: No stridor. She has no wheezes. She has no rales. She exhibits no tenderness.  Abdominal: She exhibits no distension. There is no tenderness. There is no rebound.  Musculoskeletal: Normal range of motion. She exhibits no edema.  Lymphadenopathy:    She has no cervical adenopathy.  Neurological: She is oriented to person, place, and time. Coordination normal.  Skin: No rash noted. No erythema.    Psychiatric: She has a normal mood and affect. Her behavior is normal.    ED Course  Procedures (including critical care time)  Labs Reviewed  BASIC METABOLIC PANEL - Abnormal; Notable for the following:    Potassium 3.3 (*)    Glucose, Bld 107 (*)    GFR calc non Af Amer 85 (*)    All other components within normal limits  D-DIMER, QUANTITATIVE - Abnormal; Notable for the following:    D-Dimer, Quant 0.69 (*)    All other components within normal limits  CBC  PRO B NATRIURETIC PEPTIDE  POCT I-STAT TROPONIN I   Dg Chest 2 View  01/11/2012  *RADIOLOGY REPORT*  Clinical Data: Shortness of breath and chest pain.  History of hypertension.  CHEST - 2 VIEW  Comparison: None.  Findings: Borderline heart size with normal pulmonary vascularity. No focal airspace consolidation in the lungs.  No blunting of costophrenic angles.  No pneumothorax.  Degenerative changes in the thoracic spine.  Mildly tortuous aorta.  IMPRESSION: No evidence of active pulmonary disease.  Original Report Authenticated By: Marlon Pel, M.D.   Ct Angio Chest W/cm &/or Wo Cm  01/11/2012  *RADIOLOGY REPORT*  Clinical Data: Chest and sternal pain.  Shortness of breath.  CT ANGIOGRAPHY CHEST  Technique:  Multidetector CT imaging of the chest using the standard protocol during bolus administration of intravenous contrast. Multiplanar reconstructed images including MIPs were obtained and reviewed to evaluate the vascular anatomy.  Contrast: OMNIPAQUE IOHEXOL 350 MG/ML SOLN  Comparison: None.  Findings: Technically adequate study with moderately good opacification of the central and proximal segmental pulmonary arteries.  Due to the contrast bolus, the distal pulmonary arteries are not well visualized.  No focal filling defects demonstrated in the visualized vessels.  No evidence of significant pulmonary embolus.  Normal heart size.  Normal caliber thoracic aorta.  No significant lymphadenopathy in the chest.  The  esophagus is decompressed.  No pleural effusions.  No focal airspace consolidation in the lungs.  No pneumothorax.  There is a focal low attenuation lesion within the right infraspinatus muscle measuring about 2 cm diameter.  This may represent intramuscular mass or hematoma.  Consider MRI for further evaluation.  Mild degenerative changes in the thoracic spine.  IMPRESSION: No evidence of significant pulmonary embolus.  Nonspecific low attenuation focus within the right infraspinatus muscle could represent intramuscular hematoma or mass lesion.  Consider MRI for further evaluation.  Original Report Authenticated By: Marlon Pel, M.D.     1. Chest pain     Date: 01/11/2012  Rate:68  Rhythm: normal sinus rhythm  QRS Axis: normal  Intervals: normal  ST/T Wave abnormalities: nonspecific ST changes  Conduction Disutrbances:none  Narrative Interpretation:   Old EKG Reviewed: none available     MDM  Benny Lennert, MD 01/11/12 4098  Benny Lennert, MD 01/18/12 1137

## 2012-01-11 NOTE — ED Notes (Addendum)
Pt states pain remains 7/10.  No change in s/s.  Given asa. Notified of lab results.

## 2012-01-12 DIAGNOSIS — R0789 Other chest pain: Secondary | ICD-10-CM

## 2012-01-12 DIAGNOSIS — E7800 Pure hypercholesterolemia, unspecified: Secondary | ICD-10-CM

## 2012-01-12 DIAGNOSIS — I10 Essential (primary) hypertension: Secondary | ICD-10-CM

## 2012-01-12 LAB — LIPID PANEL
Cholesterol: 230 mg/dL — ABNORMAL HIGH (ref 0–200)
HDL: 48 mg/dL (ref >39)
Triglycerides: 140 mg/dL (ref <150)

## 2012-01-12 MED ORDER — ATORVASTATIN CALCIUM 20 MG PO TABS: 20.0000 mg | ORAL_TABLET | Freq: Every day | ORAL | Status: DC

## 2012-01-12 MED ORDER — ESOMEPRAZOLE MAGNESIUM 40 MG PO CPDR: 40.0000 mg | DELAYED_RELEASE_CAPSULE | Freq: Every day | ORAL | Status: DC

## 2012-01-12 MED ORDER — LEVOTHYROXINE SODIUM 200 MCG PO TABS
200.0000 ug | ORAL_TABLET | Freq: Every day | ORAL | Status: DC
  Administered 2012-01-12: 200 ug via ORAL
  Filled 2012-01-12: qty 1

## 2012-01-12 MED ORDER — LEVOTHYROXINE SODIUM 100 MCG PO TABS: 200.0000 ug | ORAL_TABLET | Freq: Every day | ORAL | Status: DC

## 2012-01-12 MED ORDER — ASPIRIN 81 MG PO TBEC: 81.0000 mg | DELAYED_RELEASE_TABLET | Freq: Every day | ORAL | Status: DC

## 2012-01-12 NOTE — Discharge Instructions (Addendum)
You were admitted due to chest pain. The good news is it is not related to your heart, and is likely just related to heart burn and musculoskeletal pain. This should be treated with rest, your meloxicam and I'm sending you home with a prescription for an acid reducing pill. Please take all medications as prescribed. Please follow up with cardiology, for another stress test. Please call Dr. Donnie Aho of Cardiology if you do not hear from them within the next day or two. Their phone number is [phone]. Please follow up with Dr. Rivka Safer on 01/18/12 at 10:00. Have a great day and weekend.   Chest Pain (Nonspecific) It is often hard to give a specific diagnosis for the cause of chest pain. There is always a chance that your pain could be related to something serious, such as a heart attack or a blood clot in the lungs. You need to follow up with your caregiver for further evaluation. CAUSES   Heartburn.   Pneumonia or bronchitis.   Anxiety or stress.   Inflammation around your heart (pericarditis) or lung (pleuritis or pleurisy).   A blood clot in the lung.   A collapsed lung (pneumothorax). It can develop suddenly on its own (spontaneous pneumothorax) or from injury (trauma) to the chest.   Shingles infection (herpes zoster virus).  The chest wall is composed of bones, muscles, and cartilage. Any of these can be the source of the pain.  The bones can be bruised by injury.   The muscles or cartilage can be strained by coughing or overwork.   The cartilage can be affected by inflammation and become sore (costochondritis).  DIAGNOSIS  Lab tests or other studies, such as X-rays, electrocardiography, stress testing, or cardiac imaging, may be needed to find the cause of your pain.  TREATMENT   Treatment depends on what may be causing your chest pain. Treatment may include:   Acid blockers for heartburn.   Anti-inflammatory medicine.   Pain medicine for inflammatory conditions.   Antibiotics if  an infection is present.   You may be advised to change lifestyle habits. This includes stopping smoking and avoiding alcohol, caffeine, and chocolate.   You may be advised to keep your head raised (elevated) when sleeping. This reduces the chance of acid going backward from your stomach into your esophagus.   Most of the time, nonspecific chest pain will improve within 2 to 3 days with rest and mild pain medicine.  HOME CARE INSTRUCTIONS   If antibiotics were prescribed, take your antibiotics as directed. Finish them even if you start to feel better.   For the next few days, avoid physical activities that bring on chest pain. Continue physical activities as directed.   Do not smoke.   Avoid drinking alcohol.   Only take over-the-counter or prescription medicine for pain, discomfort, or fever as directed by your caregiver.   Follow your caregiver's suggestions for further testing if your chest pain does not go away.   Keep any follow-up appointments you made. If you do not go to an appointment, you could develop lasting (chronic) problems with pain. If there is any problem keeping an appointment, you must call to reschedule.  SEEK MEDICAL CARE IF:   You think you are having problems from the medicine you are taking. Read your medicine instructions carefully.   Your chest pain does not go away, even after treatment.   You develop a rash with blisters on your chest.  SEEK IMMEDIATE MEDICAL CARE IF:  You have increased chest pain or pain that spreads to your arm, neck, jaw, back, or abdomen.   You develop shortness of breath, an increasing cough, or you are coughing up blood.   You have severe back or abdominal pain, feel nauseous, or vomit.   You develop severe weakness, fainting, or chills.   You have a fever.  THIS IS AN EMERGENCY. Do not wait to see if the pain will go away. Get medical help at once. Call your local emergency services (911 in [place] Do not drive yourself to  the hospital. MAKE SURE YOU:   Understand these instructions.   Will watch your condition.   Will get help right away if you are not doing well or get worse.  Document Released: 05/10/2005 Document Revised: 07/20/2011 Document Reviewed: 03/05/2008 [facility] Patient Information 2012 [place], [place].

## 2012-01-12 NOTE — Progress Notes (Signed)
DC orders received.  Patient stable with no S/S of distress. Medication and discharge information reviewed with patient and significant other.  Patient DC home. Nolon Nations

## 2012-01-12 NOTE — Discharge Summary (Signed)
Family Medicine Resident Discharge Summary  Patient ID: Teresa Mcgee [id number] 46 y.o. Aug 02, 1966  Admit date: 01/11/2012  Discharge date and time: 01/12/12  Admitting Physician: Sanjuana Letters, MD   Discharge Physician: Doralee Albino, MD  Admission Diagnoses: Chest pain [786.5] Depression [311] Muscle spasms of neck [728.85] cp  Discharge Diagnoses: musculoskeletal chest pain and Reflux  Admission Condition: good  Discharged Condition: good  Indication for Admission: Chest pain rule out  Hospital Course: [name] is a 46 y.o. year old female with a history of hypertension, hyperlipidemia, and GERD and who is morbidly obese and who, per the patient, had an abnormal chemical stress test about 5 years ago but a normal catheterization who presented with acute chest pain that started lthe night before admission  Chest pain: Likely multifactorial including GERD and musculoskeletal as reproducible w/ change in position and palpation of chest wall. Cardiac workup negative for cardiac causes. CE negative x3, ECG w/o signs of acute ischemia, not related to exertion. PE r/o w/ negative CTA. Symptoms improved w/ GI cocktail. Of note pt at significant risk of CAD due to risk factors including obesity, HLD, HTN, and borderline HgbA1c elevation. Cardiology called and will see pt after DC for evaluation.  Please see end of note for f/u recommendations   Consults: none  Significant Diagnostic Studies:  Cardiac Panel (last 3 results)  Basename 01/11/12 2303 01/11/12 2058 01/11/12 1432  CKTOTAL 586* 571* 608*  CKMB 5.2* 5.0* 5.3*  TROPONINI <0.30 <0.30 <0.30  RELINDX 0.9 0.9 0.9   Lipid Panel     Component Value Date/Time   CHOL 230* 01/12/2012 0510   TRIG 140 01/12/2012 0510   HDL 48 01/12/2012 0510   CHOLHDL 4.8 01/12/2012 0510   VLDL 28 01/12/2012 0510   LDLCALC 154* 01/12/2012 0510   EKG: unremarkable w/ regards to ischemic changes.  CTA: No evidence of  significant pulmonary embolus. Nonspecific low attenuation focus within the right infraspinatus muscle could represent intramuscular hematoma or mass lesion. Consider MRI for further evaluation.  CXR: No evidence of active pulmonary disease.  Discharge Exam: Gen: Obese, NAD HEENT: MMM, Obese neck w/ evidenc of thyroid (pt w/ h/o thyroidectomy 1998) CV: RRR Res: CTAB, normal effort Abd: NABS, non-painful to palpation Musc: Chest pain on palpation Ext: 2+ pulses throughout  Disposition: home  Patient Instructions:  Medication List  As of 01/12/2012 11:18 AM   STOP taking these medications         simvastatin 40 MG tablet         TAKE these medications         amLODipine 5 MG tablet   Commonly known as: NORVASC   Take 1 tablet (5 mg total) by mouth daily.      aspirin 81 MG EC tablet   Take 1 tablet (81 mg total) by mouth daily.      atorvastatin 20 MG tablet   Commonly known as: LIPITOR   Take 1 tablet (20 mg total) by mouth daily at 6 PM.      Enalapril-Hydrochlorothiazide 5-12.5 MG per tablet   Take 1 tablet by mouth daily.      esomeprazole 40 MG capsule   Commonly known as: NEXIUM   Take 1 capsule (40 mg total) by mouth daily before breakfast.      levothyroxine 100 MCG tablet   Commonly known as: SYNTHROID, LEVOTHROID   Take 2 tablets (200 mcg total) by mouth daily.      liothyronine 25 MCG tablet  Commonly known as: CYTOMEL   TAKE 1 TABLET BY MOUTH DAILY      meloxicam 7.5 MG tablet   Commonly known as: MOBIC   Take 2 tablets (15 mg total) by mouth daily.      venlafaxine XR 75 MG 24 hr capsule   Commonly known as: EFFEXOR-XR   Take 1 capsule (75 mg total) by mouth daily.            Activity: activity as tolerated Diet: regular diet Wound Care: none needed  Follow-up with Dr. Rivka Safer on 01/18/12 at 11am  Follow-up Items: 1. Elevated TSH. Increased Levothyroxine to per day from home dose of per day. Recommend reconsidering  monotherapy instead of dual therapy 2. Changed cholesterol meds from simvastatin to more potent lipitor.  3. Cardiology (Dr Donnie Aho - private cardiology) to call pt to set up appt for evaluation. Recommend stress test.  4. Continue Meloxicam for Musc pain and DC w/ 14 days of PPI for GERD.  5. Repeat CK due to elevation noted during admission. If still elevated after stress test consider DC statin due to possibly statin induced myopathy.    Signed: Shelly Flatten, MD Family Medicine Resident PGY-1 [phone] 01/12/2012 11:18 AM

## 2012-01-12 NOTE — Discharge Summary (Signed)
Seen and examined today.  Agree with Dr. Satira Sark DC management and documentation

## 2012-01-14 ENCOUNTER — Other Ambulatory Visit: Payer: Self-pay | Admitting: Family Medicine

## 2012-01-15 ENCOUNTER — Encounter: Payer: Self-pay | Admitting: Family Medicine

## 2012-01-15 ENCOUNTER — Ambulatory Visit (INDEPENDENT_AMBULATORY_CARE_PROVIDER_SITE_OTHER): Payer: Medicare Other | Admitting: Family Medicine

## 2012-01-15 VITALS — BP 126/83 | HR 79 | Ht 63.0 in | Wt 268.0 lb

## 2012-01-15 DIAGNOSIS — M549 Dorsalgia, unspecified: Secondary | ICD-10-CM

## 2012-01-15 NOTE — Progress Notes (Signed)
Patient ID: Teresa Mcgee, female   DOB: 27-May-1966, 46 y.o.   MRN: [id number] HPI: Chronic low back pain since 2007.  Went to pain clinic for >1 year, last appt was in 2009 b/c MD told her this would be chronic and she would need to live with it. Pt reports bulging disks in low back.  Had MRI in 2007. Also had scans done 1 year ago.  Has been hurting more recently, over the past month.  Pain is a heaviness, not sharp or stabbing.  Pain gets worse with standing.  Pain is a little better with sitting and laying. Started walking 3 weeks ago (per Dr. Rolene Arbour rec for pt to lose weight to help with back pain).  Is having pain with walking as well.  Usually walks for 10-15 min every other day for the past 2-3 weeks, but lately (past 1 week) has been unable to walk because of this heaviness/pain in her back. Reports chronic weakness in legs.  Also with chronic (years) of numbness/tingling-- unchanged. No bowel or bladder incontinence.  No falls.  No recent MVCs or injuries.   Other than walking, no change in activity.  Is able to do housework/cook/clean but needs to take frequent breaks- every 3 minutes she must sit for at least 15 minutes, and then tries to stand again and pain comes back. Worse than in 2007. Can't bend over to tie shoes because of pain. Did physical therapy in 2011, which didn't really seem to help-- just seemed to make the pain worse. For pain: occasionally gets vicodin, will otherwise take meloxicam and tylenol, which doesn't help.  Vicodin doesn't really help either.  Uses heating pad-- helps a little. + bilateral knee pain, no hip or ankle pain.    PE: Filed Vitals:   01/15/12 1405  BP: 126/83  Pulse: 79   Back: + paraspinal tenderness over lumbar area, most significant over iliac crest; decreased flexion of back Ext: 5/5 strength BLE; sensation intact throughout

## 2012-01-16 ENCOUNTER — Encounter: Payer: Self-pay | Admitting: Family Medicine

## 2012-01-16 NOTE — Assessment & Plan Note (Signed)
Functional low back pain in paraspinous erector muscles . Macromastia and truncal obesity cntributors. Will do triggerpoint injections at posterior iliac crest and start on HEP. rtc 3-4 w. I reviewed her films w her. She has hx of 'bulging discs' and I explained that the HNP were NOT the issue.

## 2012-01-17 ENCOUNTER — Other Ambulatory Visit: Payer: Self-pay | Admitting: Family Medicine

## 2012-01-17 MED ORDER — HYDROXYZINE HCL 10 MG PO TABS: 10.0000 mg | ORAL_TABLET | Freq: Every evening | ORAL | Status: AC | PRN

## 2012-01-18 ENCOUNTER — Ambulatory Visit: Payer: Medicare Other | Admitting: Family Medicine

## 2012-02-12 ENCOUNTER — Other Ambulatory Visit: Payer: Self-pay | Admitting: Family Medicine

## 2012-02-14 ENCOUNTER — Ambulatory Visit (INDEPENDENT_AMBULATORY_CARE_PROVIDER_SITE_OTHER): Payer: PRIVATE HEALTH INSURANCE | Admitting: Family Medicine

## 2012-02-14 ENCOUNTER — Encounter: Payer: Self-pay | Admitting: Family Medicine

## 2012-02-14 VITALS — BP 136/93 | HR 85 | Temp 98.5°F | Ht 63.0 in | Wt 268.5 lb

## 2012-02-14 DIAGNOSIS — R7302 Impaired glucose tolerance (oral): Secondary | ICD-10-CM | POA: Insufficient documentation

## 2012-02-14 DIAGNOSIS — R7309 Other abnormal glucose: Secondary | ICD-10-CM

## 2012-02-14 DIAGNOSIS — E039 Hypothyroidism, unspecified: Secondary | ICD-10-CM

## 2012-02-14 NOTE — Patient Instructions (Addendum)
You have pre- diabetes  I recommend continuing to work with Dr. Gerilyn Pilgrim on your exercise and nutrition  Try taking your thyroid medicines first thing in the morning before you eat  Return in 6-8 weeks to recheck your thyroid and blood sugar

## 2012-02-14 NOTE — Assessment & Plan Note (Addendum)
While reviewing patient's labs, i noted that her TSH was also elevated at that time.  She states she is compliant with 200 mcg of synthroid + cytomel 25.  I have asked patient instead of taking it with all of her other medications at dinner, to change to AM before meal administration.  Has previous been therapeutic on previous TSH checks this year.  Last lab was while hospitalized.  Will recheck in 6 weeks at follow-up appt.

## 2012-02-14 NOTE — Assessment & Plan Note (Signed)
Had a1c 6.0% one month ago while in hospital.  Discussed natural history of progression to diabetes and options for monitoring and treatment.  She states she would like to conitnue with lifestyle modifications, plans on continuing nutrition management with Dr. Gerilyn Pilgrim.  Will follow-up in 6-8 weeks to re-check a1c and discuss lifestyle modification

## 2012-02-14 NOTE — Progress Notes (Signed)
  Subjective:    Patient ID: Teresa Mcgee, female    DOB: [birth year], 46 y.o.   MRN: [id number]  HPI  Comes in today to discuss labwork  States her cardiologist told her she was pre-diabetic.  She did not have previous knowledge of this, would like to discuss more.  States she is applying for disability for low back pain.  Asks me the dates of her previous doctors apopintments  Review of Systems See HPI    Objective:   Physical Exam GEN: NAD     Assessment & Plan:

## 2012-02-19 ENCOUNTER — Ambulatory Visit (INDEPENDENT_AMBULATORY_CARE_PROVIDER_SITE_OTHER): Payer: PRIVATE HEALTH INSURANCE | Admitting: Family Medicine

## 2012-02-19 VITALS — BP 128/91

## 2012-02-19 DIAGNOSIS — M549 Dorsalgia, unspecified: Secondary | ICD-10-CM

## 2012-02-20 NOTE — Assessment & Plan Note (Signed)
Functional low back pain. She has significantly improved in her range of motion. I think a corticosteroid trigger point injections helped her initially. She's been relatively consistent with her exercises. We discussed options today including a second trigger point injection which she declined. I reemphasized absolute need for core strengthening and her home exercise program. Am also adding some trunk rotation exercises which were given her in handout form and explained. I'll see her back in 3-4 weeks.

## 2012-02-20 NOTE — Progress Notes (Signed)
  Subjective:    Patient ID: Teresa Mcgee, female    DOB: 04-03-1966, 46 y.o.   MRN: [id number]  HPI  Followup low back pain. We gave her trigger point injection last time which seemed to significantly helped for about a week. She then had a couple of weeks of pretty consistent pain relief and was doing her exercises regularly. The third week after her injection she noted return of her pain. He does not as bad as it was initially but now is about a 4 or 5/10. Worse with long periods of standing. She has slacked off on her exercises during the last week because she thinks is making it worse. No new symptoms. No urinary or fecal incontinence. No lower stream any weakness.  Review of Systems    see history of present illness above. Additionally denies fever. Objective:   Physical Exam  Vital signs reviewed. GENERAL: Well developed, well nourished, no acute distress BACK: Nontender to palpation or percussion of the lumbar vertebra. Minimal tenderness to palpation across the bilateral posterior iliac crest area. Flexion at the hips is significantly improved and now she can get her fingertips within 18 inches of the floor compared with about 24 or 28 inches at last office visit. Hyperextension is 5. Lower stream he strength 5 out of 5 bilaterally equal      Assessment & Plan:

## 2012-03-11 ENCOUNTER — Ambulatory Visit (INDEPENDENT_AMBULATORY_CARE_PROVIDER_SITE_OTHER): Payer: PRIVATE HEALTH INSURANCE | Admitting: Family Medicine

## 2012-03-11 ENCOUNTER — Encounter: Payer: Self-pay | Admitting: Family Medicine

## 2012-03-11 VITALS — BP 118/77 | HR 71

## 2012-03-11 DIAGNOSIS — M549 Dorsalgia, unspecified: Secondary | ICD-10-CM

## 2012-03-11 MED ORDER — GABAPENTIN 100 MG PO CAPS: ORAL_CAPSULE | ORAL | Status: DC

## 2012-03-11 NOTE — Patient Instructions (Addendum)
Take by tablet by mouth Day 1-3:   take one at night  Day 4-6:  Take 2 at night Day 7-10:  take 3 at night Day 11-14:  3 at night, 1 in am Day 15-18:   3 at night, 2 in am Day 19-21: 3 at night, 3 in am Day 22-25: 3 at night, 3 in am, 2 at lunch Day 26 forward : 3 at night, 3 in am, 3 at lunch   

## 2012-03-13 NOTE — Assessment & Plan Note (Signed)
Discussed options. Chills malaise that she did not want to pursue a trigger point injection. We will add some different exercises and have her followup in about a month. I do think physical therapy type home exercise program is going to be the answer for her. Also recommended weight loss.

## 2012-03-13 NOTE — Progress Notes (Signed)
  Subjective:    Patient ID: Teresa Mcgee, female    DOB: 07-14-1966, 46 y.o.   MRN: [id number]  HPI Followup low back pain. She has done 2 months of physical therapy and was doing well until about 2 weeks ago. And she noticed the pain started increasing. When increase she backed off her exercises. We had given her trigger point injection at one point  she would like to consider that again.   Review of Systems Denies numbness or tingling in her lower extremity, no change in bowel or bladder continence.    Objective:   Physical Exam  Vital signs reviewed. GENERAL: Well developed, well nourished, no acute distress BACK: Mildly tender to palpation in the lumbar area. Flexion at the hips is limited to about 90 secondary to pain and stiffness. Hyperextension is 10. Lateral rotation causes some pain but is full in range of motion. LOWER extremity: Strength 5 out of 5 flexion and extension.      Assessment & Plan:

## 2012-03-14 ENCOUNTER — Other Ambulatory Visit (HOSPITAL_COMMUNITY): Payer: Self-pay | Admitting: Family Medicine

## 2012-04-14 ENCOUNTER — Emergency Department (HOSPITAL_COMMUNITY)
Admission: EM | Admit: 2012-04-14 | Discharge: 2012-04-14 | Disposition: A | Discharge status: Home / Self Care | Payer: PRIVATE HEALTH INSURANCE | Attending: Emergency Medicine | Admitting: Emergency Medicine

## 2012-04-14 ENCOUNTER — Emergency Department (HOSPITAL_COMMUNITY): Payer: PRIVATE HEALTH INSURANCE

## 2012-04-14 ENCOUNTER — Encounter (HOSPITAL_COMMUNITY): Payer: Self-pay | Admitting: *Deleted

## 2012-04-14 DIAGNOSIS — F17200 Nicotine dependence, unspecified, uncomplicated: Secondary | ICD-10-CM | POA: Insufficient documentation

## 2012-04-14 DIAGNOSIS — Z8739 Personal history of other diseases of the musculoskeletal system and connective tissue: Secondary | ICD-10-CM | POA: Insufficient documentation

## 2012-04-14 DIAGNOSIS — K219 Gastro-esophageal reflux disease without esophagitis: Secondary | ICD-10-CM | POA: Insufficient documentation

## 2012-04-14 DIAGNOSIS — Z79899 Other long term (current) drug therapy: Secondary | ICD-10-CM | POA: Insufficient documentation

## 2012-04-14 DIAGNOSIS — Z7982 Long term (current) use of aspirin: Secondary | ICD-10-CM | POA: Insufficient documentation

## 2012-04-14 DIAGNOSIS — R079 Chest pain, unspecified: Secondary | ICD-10-CM | POA: Insufficient documentation

## 2012-04-14 DIAGNOSIS — I10 Essential (primary) hypertension: Secondary | ICD-10-CM | POA: Insufficient documentation

## 2012-04-14 DIAGNOSIS — R51 Headache: Secondary | ICD-10-CM

## 2012-04-14 LAB — CBC WITH DIFFERENTIAL/PLATELET
Eosinophils Relative: 2 % (ref 0–5)
Hemoglobin: 13.4 g/dL (ref 12.0–15.0)
Lymphocytes Relative: 38 % (ref 12–46)
Lymphs Abs: 3.3 10*3/uL (ref 0.7–4.0)
MCH: 31.5 pg (ref 26.0–34.0)
MCV: 93.6 fL (ref 78.0–100.0)
Monocytes Relative: 5 % (ref 3–12)
Neutrophils Relative %: 54 % (ref 43–77)
Platelets: 216 10*3/uL (ref 150–400)
RBC: 4.25 MIL/uL (ref 3.87–5.11)
WBC: 8.8 10*3/uL (ref 4.0–10.5)

## 2012-04-14 LAB — POCT I-STAT TROPONIN I: Troponin i, poc: 0.01 ng/mL (ref 0.00–0.08)

## 2012-04-14 LAB — POCT I-STAT, CHEM 8
BUN: 5 mg/dL — ABNORMAL LOW (ref 6–23)
Creatinine, Ser: 0.9 mg/dL (ref 0.50–1.10)
Glucose, Bld: 126 mg/dL — ABNORMAL HIGH (ref 70–99)
Potassium: 3.1 mEq/L — ABNORMAL LOW (ref 3.5–5.1)
Sodium: 142 mEq/L (ref 135–145)

## 2012-04-14 MED ORDER — DIPHENHYDRAMINE HCL 50 MG/ML IJ SOLN
25.0000 mg | Freq: Once | INTRAMUSCULAR | Status: AC
  Administered 2012-04-14: 25 mg via INTRAVENOUS
  Filled 2012-04-14: qty 1

## 2012-04-14 MED ORDER — METOCLOPRAMIDE HCL 5 MG/ML IJ SOLN
10.0000 mg | Freq: Once | INTRAMUSCULAR | Status: AC
  Administered 2012-04-14: 10 mg via INTRAVENOUS
  Filled 2012-04-14: qty 2

## 2012-04-14 MED ORDER — KETOROLAC TROMETHAMINE 30 MG/ML IJ SOLN
30.0000 mg | Freq: Once | INTRAMUSCULAR | Status: AC
  Administered 2012-04-14: 30 mg via INTRAVENOUS
  Filled 2012-04-14: qty 1

## 2012-04-14 MED ORDER — POTASSIUM CHLORIDE CRYS ER 20 MEQ PO TBCR
40.0000 meq | EXTENDED_RELEASE_TABLET | Freq: Once | ORAL | Status: AC
  Administered 2012-04-14: 40 meq via ORAL
  Filled 2012-04-14: qty 2

## 2012-04-14 NOTE — ED Notes (Signed)
Pt states headache right frontal area since 0200.  Pt reports chest pain to mid back that started 2 hours ago.  Pt denies nausea.  Pt sts she did feel like she could not catch her breath

## 2012-04-14 NOTE — ED Notes (Addendum)
Pt states substernal CP (pressure) that radiates to the back with SOB. Denies N/V, and diaphoresis. Pain exacerbated by movement and relieved with rest. HA right temporal and parietal region.

## 2012-04-15 NOTE — ED Provider Notes (Signed)
Medical screening examination/treatment/procedure(s) were performed by non-physician practitioner and as supervising physician I was immediately available for consultation/collaboration.  Iris Hairston R. Honesti Seaberg, MD 04/15/12 1559 

## 2012-04-15 NOTE — ED Provider Notes (Signed)
History     CSN: [id number]  Arrival date & time 04/14/12  0540   First MD Initiated Contact with Patient 04/14/12 [phone]      Chief Complaint  Patient presents with  . Chest Pain  . Headache    (Consider location/radiation/quality/duration/timing/severity/associated sxs/prior treatment) HPI Hx from pt. Teresa Mcgee is a 46 y.o. female with hypertension, hyperlipidemia, GERD who presents with c/o chest pain and headache.  Her headache started around 0200 this AM - it is located to the R frontal area. No radiation. It is throbbing in nature and constant. No known aggravating or alleviating factors. No treatment prior to coming here. Pt states she does have chronic headaches and this feels somewhat similar. She did have some slight photophobia earlier. Denies visual change, nausea, vomiting, dizziness, weakness, numbness.  Pt began to have chest pain at about 0400. She states that she was lying in bed arguing with her son on the phone when it started. Pain is located to the substernal area and seems to occasionally radiate to back. It is pressure like, constant, but improving. It worsens with movement. She felt as if it was difficult to catch her breath when the pain first began, but this is now improved. Denies nausea, vomiting, diaphoresis, palpitations. No LE edema or pain. Pt did have chest pain in May and was admitted to the family practice service for rule out. Enzymes were negative and she was discharged to follow up with Dr. Donnie Aho with cardiology in the office. She reports that she saw him and was told that she did not need a stress test. Pt reports that she last had a stress test about 5 years ago which she was unable to tolerate and subsequently had a cardiac cath which was unremarkable.  Past Medical History  Diagnosis Date  . Hypertension   . Hyperlipidemia   . GERD (gastroesophageal reflux disease)   . Bell's palsy may 2012  . Chest pain   . Shortness of breath   . Sleep  apnea     uses cpap  . Hypothyroidism   . H/O hiatal hernia   . Shingles   . Arthritis     Past Surgical History  Procedure Date  . Thyroidectomy 1998  . Cesarian     3x  . Partial hysterectomy 2000    abnormal uterine bleeding  . Cesarean section     Family History  Problem Relation Age of Onset  . Heart attack Father 71    deceased  . Diabetes Brother   . Stroke Brother 25    twice  . Stroke Sister 92  . Diabetes Sister   . Heart attack Sister 61  . Heart attack Brother 14    History  Substance Use Topics  . Smoking status: Current Everyday Smoker -- 0.3 packs/day for 20 years    Types: Cigarettes  . Smokeless tobacco: Never Used   Comment: used to smoke 1.5 - 2 PPD  . Alcohol Use: No    OB History    Grav Para Term Preterm Abortions TAB SAB Ect Mult Living                  Review of Systems  All other systems reviewed and are negative.    Allergies  Review of patient's allergies indicates no known allergies.  Home Medications   Current Outpatient Rx  Name Route Sig Dispense Refill  . ASPIRIN 81 MG PO TBEC Oral Take 81 mg by mouth daily.    Marland Kitchen  ATORVASTATIN CALCIUM 20 MG PO TABS Oral Take 20 mg by mouth daily at 6 PM.    . ENALAPRIL-HYDROCHLOROTHIAZIDE 10-25 MG PO TABS Oral Take 1 tablet by mouth at bedtime.    Marland Kitchen ESOMEPRAZOLE MAGNESIUM 40 MG PO CPDR Oral Take 40 mg by mouth at bedtime.    Marland Kitchen GABAPENTIN 100 MG PO CAPS Oral Take 300 mg by mouth 3 (three) times daily.    Marland Kitchen LEVOTHYROXINE SODIUM 100 MCG PO TABS Oral Take 200 mcg by mouth daily.    Marland Kitchen LIOTHYRONINE SODIUM 25 MCG PO TABS  TAKE 1 TABLET BY MOUTH DAILY 30 tablet 0  . MELOXICAM 7.5 MG PO TABS Oral Take 2 tablets (15 mg total) by mouth daily. 30 tablet 3  . VENLAFAXINE HCL ER 75 MG PO CP24 Oral Take 75 mg by mouth at bedtime.      BP 114/63  Pulse 61  Temp 97.6 F (36.4 C) (Oral)  Resp 18  SpO2 97%  Physical Exam  Nursing note and vitals reviewed. Constitutional: She is oriented to  person, place, and time. She appears well-developed and well-nourished. No distress.  HENT:  Head: Normocephalic and atraumatic.  Right Ear: External ear normal.  Left Ear: External ear normal.  Mouth/Throat: Oropharynx is clear and moist. No oropharyngeal exudate.  Eyes: EOM are normal. Pupils are equal, round, and reactive to light.  Neck: Normal range of motion. Neck supple.  Cardiovascular: Normal rate, regular rhythm and normal heart sounds.   Pulmonary/Chest: Effort normal and breath sounds normal. She exhibits tenderness.    Abdominal: Soft. Bowel sounds are normal. There is no tenderness. There is no rebound and no guarding.  Musculoskeletal: Normal range of motion.  Lymphadenopathy:    She has no cervical adenopathy.  Neurological: She is alert and oriented to person, place, and time. No cranial nerve deficit. She exhibits normal muscle tone. Coordination normal.  Skin: Skin is warm and dry. She is not diaphoretic.  Psychiatric: She has a normal mood and affect.    ED Course  Procedures (including critical care time)  Date: 04/14/2012  Rate: 78   Rhythm: normal sinus rhythm  QRS Axis: normal  Intervals: QT prolonged  ST/T Wave abnormalities: nonspecific T changes inferiorly  Conduction Disutrbances:none  Narrative Interpretation:   Old EKG Reviewed: as compared with May 31, rate increased   Labs Reviewed  POCT I-STAT, CHEM 8 - Abnormal; Notable for the following:    Potassium 3.1 (*)     BUN 5 (*)     Glucose, Bld 126 (*)     All other components within normal limits  CBC WITH DIFFERENTIAL  POCT I-STAT TROPONIN I  POCT I-STAT TROPONIN I  LAB REPORT - SCANNED   Dg Chest 2 View  04/14/2012  *RADIOLOGY REPORT*  Clinical Data:  chest pain and headache  CHEST - 2 VIEW  Comparison: 01/11/2012  Findings: The heart size and mediastinal contours are within normal limits.  Both lungs are clear.  The visualized skeletal structures are unremarkable.  IMPRESSION: Negative  exam.   Original Report Authenticated By: Rosealee Albee, M.D.      1. Chest pain   2. Headache       MDM  Pt presents with complaint of headache and chest pain. Headache is consistent with previous; not "the worst headache of her life." No neurologic deficits. Chest pain started while under stress. It is reproducible with movement and palpation. Pt recently saw cardiology and was told she didn't need stress  test. ECG unchanged from previous, troponin negative x 2. Pt was given headache cocktail with toradol/reglan/benadryl and had relief of both headache and pain. Feel patient stable for discharge and f/u as outpatient. Findings discussed with pt. Reasons to return discussed.  Case d/w Dr. Rubin Payor who was agreeable with plan.        Grant Fontana, PA-C 04/15/12 1423

## 2012-04-19 ENCOUNTER — Encounter: Payer: Self-pay | Admitting: Family Medicine

## 2012-04-19 ENCOUNTER — Ambulatory Visit (INDEPENDENT_AMBULATORY_CARE_PROVIDER_SITE_OTHER): Payer: PRIVATE HEALTH INSURANCE | Admitting: Family Medicine

## 2012-04-19 VITALS — BP 117/88 | HR 70 | Ht 63.0 in | Wt 263.0 lb

## 2012-04-19 DIAGNOSIS — M549 Dorsalgia, unspecified: Secondary | ICD-10-CM

## 2012-04-23 NOTE — Progress Notes (Signed)
  Subjective:    Patient ID: Teresa Mcgee, female    DOB: 12-06-1965, 46 y.o.   MRN: [id number]  HPI  Continued back pain--no improvement, Has tried HEP, stretching etc. Feels all  'locked up" Pain is diffuse in entire back but worse in lumbar area. No specific injry. Has had a lot of weight gainin last few years  denies radiation to leg, weakness of leg.  Review of Systems No incontinence, no LE weakness or numbness    Objective:   Physical Exam  Vital signs reviewed. GENERAL: Well developed, well nourished, no acute distress. Obese POSTURE: pelvis forward at standing position ---compensatory for large girth abdomen and some macromastia. BACK nontender to percussion of vertebral bodies, no CVA tenderness. Diffuse muscle tenderness lumbar area B and this reproduces some of her pain. LE : neg SLR B in seated and supine positions. 5/5 symmetrical LE strength hip and knee.  NEURO DTRs 1+ B = knee and ankle. Sensation to soft touch intact B LE.       Assessment & Plan:  1. Functional back pain---has not improved with several tries at HEP, PT. I wonder if she would get a jump start on completing a HEP with some osteopathic evaluation / treatment. Her new PCP is an osteopath so I will have her f/u with him. I have nothing else to offer. I see nothing surgical. I think this is ling standing muscle tightness and poor core strength in a setting of truncal obesity which really impacts her posture.

## 2012-04-23 NOTE — Assessment & Plan Note (Signed)
.   Functional back pain---has not improved with several tries at HEP, PT. I wonder if she would get a jump start on successfully completing a HEP with some osteopathic evaluation / treatment. Her new PCP is an osteopath so I will have her f/u with him. I have nothing else to offer this very nice lady. I see nothing surgical. I think this is long standing muscle tightness and poor core strength in a setting of truncal obesity which really impacts her posture.

## 2012-04-24 ENCOUNTER — Ambulatory Visit (INDEPENDENT_AMBULATORY_CARE_PROVIDER_SITE_OTHER): Payer: PRIVATE HEALTH INSURANCE | Admitting: Sports Medicine

## 2012-04-24 ENCOUNTER — Encounter: Payer: Self-pay | Admitting: Sports Medicine

## 2012-04-24 VITALS — BP 108/68 | HR 72 | Temp 98.0°F | Ht 63.0 in | Wt 260.8 lb

## 2012-04-24 DIAGNOSIS — M999 Biomechanical lesion, unspecified: Secondary | ICD-10-CM

## 2012-04-24 DIAGNOSIS — Z23 Encounter for immunization: Secondary | ICD-10-CM

## 2012-04-24 DIAGNOSIS — E669 Obesity, unspecified: Secondary | ICD-10-CM

## 2012-04-24 DIAGNOSIS — F3289 Other specified depressive episodes: Secondary | ICD-10-CM

## 2012-04-24 DIAGNOSIS — F329 Major depressive disorder, single episode, unspecified: Secondary | ICD-10-CM

## 2012-04-24 DIAGNOSIS — E7800 Pure hypercholesterolemia, unspecified: Secondary | ICD-10-CM

## 2012-04-24 DIAGNOSIS — M549 Dorsalgia, unspecified: Secondary | ICD-10-CM

## 2012-04-24 DIAGNOSIS — I10 Essential (primary) hypertension: Secondary | ICD-10-CM

## 2012-04-24 DIAGNOSIS — F17200 Nicotine dependence, unspecified, uncomplicated: Secondary | ICD-10-CM

## 2012-04-24 MED ORDER — BACLOFEN 10 MG PO TABS
10.0000 mg | ORAL_TABLET | Freq: Every evening | ORAL | Status: AC | PRN
Start: 2012-04-24 — End: 2012-05-24

## 2012-04-24 MED ORDER — ATORVASTATIN CALCIUM 40 MG PO TABS: 40.0000 mg | ORAL_TABLET | Freq: Every day | ORAL | Status: DC

## 2012-04-24 NOTE — Patient Instructions (Addendum)
It was nice to meet you today.  [facility].  Quit smoking!!!!  Follow up in 3 months or sooner if you need me.  Please increase you Lipitor to two pills a day until you are done with your current prescription.  Your next prescription will be double the dose and you will only need to take 1 a day.  Here are some basic nutrition rules to remember:  "Eat Real Foods & Drink Real Drinks" - if you think it was made in a factory . . it is likely best to avoid it as a staple in your diet.  Limiting these types of foods to 1-2 times per week is a good idea.  Sticking with fresh fruits and vegetables as well as home cooked meals will typically provide more nutrition and less salt than prepackaged meals.     Limit the amount of sugar sweetened and artificially sweetened foods and beverages.  Sticking with water flavored with a slice of lemon, lime or orange is a great option if you want something with flavor in it.  Using flavored seltzer water to flavor plain water will also add some bite if you want something more than flavor.     Here are 2 of my favorite web sites that provide great nutrition and exercise advice.   www.eats[place]movemore[place].com www.choosemyplate.gov

## 2012-04-25 DIAGNOSIS — M999 Biomechanical lesion, unspecified: Secondary | ICD-10-CM | POA: Insufficient documentation

## 2012-04-25 NOTE — Progress Notes (Signed)
  [facility]  Patient name: Teresa Mcgee MRN [id number]  Date of birth: 04/10/1966  CC & HPI:  Teresa Mcgee is a 46 y.o. female presenting today for evaluation of low back pain.  Reports that she has chronic low back and cervical/bilateral shoulder pain.  She's been seen at multiple providers including sports medicine and does not have any evidence of significant organic pathology and is felt to have mechanical low back and cervical/bilateral shoulder pain.  This is felt to be associated with her weight and activity level.  She reports trying to lose weight on regular basis but is having a difficult time.  Denies any falls or radicular like symptoms or pain in her bilateral lower extremities.  No radicular symptoms and her up upper extremities.  No loss of bowel or bladder  ROS:  Per history of present illness  Pertinent History Reviewed:  Medical & Surgical Hx:  Reviewed: Significant for hypothyroidism, depression, obesity Medications: Reviewed & Updated - see associated section Social History: Reviewed - Significant for current every smoker  Objective Findings:  Vitals:  Filed Vitals:   04/24/12 1007  BP: 108/68  Pulse: 72  Temp: 98 F (36.7 C)    PE: GENERAL:  Adult African American obese female. In no discomfort; no respiratory distress. PSYCH: Alert and appropriately interactive; Insight:Fair   H&N: AT/[place], trachea midline EENT:  MMM, no scleral icterus, EOMi EXTREMITIES: Moves all 4 extremities spontaneously, warm well perfused, no edema, bilateral DP and PT pulses 2/4.   NEUROMSK:  Generalized posture: Anterior pelvic tilt with thoracic hyperextension DTRs, 2+/4 B LE Straight leg raise: Negative bilateral  Standing Flexion Test: right            Seated Flexion/ASIS CompressionTest: right Leg Length screening: pseudoshort left leg CERVICAL Negative spurlings compression  THORACIC  T3rR, T5rL  LUMBAR  L3 rL; L5rR  SACRUM  LonL  PELVIS   R anterior       Assessment & Plan:

## 2012-04-25 NOTE — Assessment & Plan Note (Signed)
And extensive time discussing diet and exercise.  Have counseled the patient to return for further counseling both nutrition and exercise.

## 2012-04-25 NOTE — Assessment & Plan Note (Signed)
Will increase lipitor to 40mg  q day

## 2012-04-25 NOTE — Assessment & Plan Note (Addendum)
Patient considering quitting smoking.  Discussed the importance of this regarding her overall health including her chronic pain condition.  Mentioned 1 800 quit now.  Patient will followup to discuss this further if interested and medication assistance

## 2012-04-25 NOTE — Assessment & Plan Note (Signed)
Stable/Well Controlled - no changes at this time. 

## 2012-04-25 NOTE — Assessment & Plan Note (Signed)
Appears to be mechanical back pain related.  No focal findings on exam suggestive of organic pathology.  An extensive time discussing pathogenesis of chronic pain regarding obesity, and relative deconditioning. Patient plans to go to the [facility] on a daily basis.  She will go to [facility] which has a pool 3 days a week.  Pool exercise will be extremely beneficial for restoring range of motion and decreasing chronic overall pain.  Also discussed continued efforts at weight loss including dietary changes and smoking cessation  Greater than 50% of this 25 minute visit was spent in counseling and providing direct patient care

## 2012-04-25 NOTE — Assessment & Plan Note (Signed)
Likely the major contributing factor to her chronic pain issues

## 2012-04-25 NOTE — Assessment & Plan Note (Signed)
Somatic dysfunction noted in the thoracic, lumbar, sacrum, pelvis Decision today to treat with OMT was based on Physical Exam  Treatment was limited by body habitus  Patient tolerated the procedure well with improvement in symptoms  Patient given exercises, stretches and lifestyle modifications  See medications in patient instructions if given  Patient will follow up in 4 weeks

## 2012-06-30 ENCOUNTER — Other Ambulatory Visit: Payer: Self-pay | Admitting: Family Medicine

## 2012-06-30 DIAGNOSIS — E039 Hypothyroidism, unspecified: Secondary | ICD-10-CM

## 2012-06-30 DIAGNOSIS — E669 Obesity, unspecified: Secondary | ICD-10-CM

## 2012-06-30 DIAGNOSIS — E7800 Pure hypercholesterolemia, unspecified: Secondary | ICD-10-CM

## 2012-06-30 DIAGNOSIS — R7302 Impaired glucose tolerance (oral): Secondary | ICD-10-CM

## 2012-07-01 NOTE — Telephone Encounter (Signed)
Please call and schedule follow up appointment.  Will not refill until recheck of levels

## 2012-07-02 NOTE — Telephone Encounter (Signed)
Attempted call but no answer,  no voicemail. Called pharmacy and ask them to notify patient that she will need appointment before further refills.

## 2012-07-03 NOTE — Telephone Encounter (Signed)
Spoke with patient . Appointment scheduled for 12/04 for labs. Will ask Dr. Berline Chough to place future order .

## 2012-07-03 NOTE — Addendum Note (Signed)
Addended by: Gaspar Bidding D on: 07/03/2012 05:22 PM   Modules accepted: Orders

## 2012-07-17 ENCOUNTER — Other Ambulatory Visit (INDEPENDENT_AMBULATORY_CARE_PROVIDER_SITE_OTHER): Payer: PRIVATE HEALTH INSURANCE

## 2012-07-17 DIAGNOSIS — R7302 Impaired glucose tolerance (oral): Secondary | ICD-10-CM

## 2012-07-17 DIAGNOSIS — E7800 Pure hypercholesterolemia, unspecified: Secondary | ICD-10-CM

## 2012-07-17 DIAGNOSIS — E039 Hypothyroidism, unspecified: Secondary | ICD-10-CM

## 2012-07-17 DIAGNOSIS — R7309 Other abnormal glucose: Secondary | ICD-10-CM

## 2012-07-17 LAB — LIPID PANEL
Cholesterol: 219 mg/dL — ABNORMAL HIGH (ref 0–200)
HDL: 42 mg/dL (ref >39)
Total CHOL/HDL Ratio: 5.2 Ratio
Triglycerides: 119 mg/dL (ref <150)
VLDL: 24 mg/dL (ref 0–40)

## 2012-07-17 LAB — T4, FREE: Free T4: 1.38 ng/dL (ref 0.80–1.80)

## 2012-07-17 NOTE — Progress Notes (Signed)
TSH ,FLP, F-T4 AND A1C DONE TODAY Takari Lundahl

## 2013-01-25 ENCOUNTER — Emergency Department: Payer: Self-pay | Admitting: Emergency Medicine

## 2013-04-20 ENCOUNTER — Emergency Department (HOSPITAL_COMMUNITY)
Admission: EM | Admit: 2013-04-20 | Discharge: 2013-04-20 | Disposition: A | Discharge status: Home / Self Care | Payer: PRIVATE HEALTH INSURANCE | Attending: Emergency Medicine | Admitting: Emergency Medicine

## 2013-04-20 ENCOUNTER — Other Ambulatory Visit: Payer: Self-pay

## 2013-04-20 ENCOUNTER — Encounter (HOSPITAL_COMMUNITY): Payer: Self-pay | Admitting: *Deleted

## 2013-04-20 ENCOUNTER — Emergency Department (HOSPITAL_COMMUNITY): Payer: PRIVATE HEALTH INSURANCE

## 2013-04-20 DIAGNOSIS — R05 Cough: Secondary | ICD-10-CM

## 2013-04-20 DIAGNOSIS — M129 Arthropathy, unspecified: Secondary | ICD-10-CM | POA: Insufficient documentation

## 2013-04-20 DIAGNOSIS — E039 Hypothyroidism, unspecified: Secondary | ICD-10-CM | POA: Insufficient documentation

## 2013-04-20 DIAGNOSIS — K219 Gastro-esophageal reflux disease without esophagitis: Secondary | ICD-10-CM | POA: Diagnosis not present

## 2013-04-20 DIAGNOSIS — Z79899 Other long term (current) drug therapy: Secondary | ICD-10-CM | POA: Diagnosis not present

## 2013-04-20 DIAGNOSIS — Z8619 Personal history of other infectious and parasitic diseases: Secondary | ICD-10-CM | POA: Diagnosis not present

## 2013-04-20 DIAGNOSIS — G4730 Sleep apnea, unspecified: Secondary | ICD-10-CM | POA: Insufficient documentation

## 2013-04-20 DIAGNOSIS — R112 Nausea with vomiting, unspecified: Secondary | ICD-10-CM

## 2013-04-20 DIAGNOSIS — I10 Essential (primary) hypertension: Secondary | ICD-10-CM | POA: Diagnosis not present

## 2013-04-20 DIAGNOSIS — F17200 Nicotine dependence, unspecified, uncomplicated: Secondary | ICD-10-CM | POA: Insufficient documentation

## 2013-04-20 DIAGNOSIS — H00019 Hordeolum externum unspecified eye, unspecified eyelid: Secondary | ICD-10-CM | POA: Insufficient documentation

## 2013-04-20 DIAGNOSIS — H00016 Hordeolum externum left eye, unspecified eyelid: Secondary | ICD-10-CM

## 2013-04-20 DIAGNOSIS — R059 Cough, unspecified: Secondary | ICD-10-CM | POA: Diagnosis not present

## 2013-04-20 LAB — BASIC METABOLIC PANEL
BUN: 5 mg/dL — ABNORMAL LOW (ref 6–23)
CO2: 31 mEq/L (ref 19–32)
Calcium: 9.3 mg/dL (ref 8.4–10.5)
Chloride: 100 mEq/L (ref 96–112)
GFR calc Af Amer: >90 mL/min (ref >90)
GFR calc non Af Amer: >90 mL/min (ref >90)
Glucose, Bld: 116 mg/dL — ABNORMAL HIGH (ref 70–99)
Sodium: 140 mEq/L (ref 135–145)

## 2013-04-20 LAB — CBC
HCT: 40.2 % (ref 36.0–46.0)
Hemoglobin: 13.7 g/dL (ref 12.0–15.0)
MCH: 30.8 pg (ref 26.0–34.0)
MCHC: 34.1 g/dL (ref 30.0–36.0)
RBC: 4.45 MIL/uL (ref 3.87–5.11)

## 2013-04-20 MED ORDER — POTASSIUM CHLORIDE CRYS ER 20 MEQ PO TBCR
40.0000 meq | EXTENDED_RELEASE_TABLET | Freq: Once | ORAL | Status: AC
  Administered 2013-04-20: 40 meq via ORAL
  Filled 2013-04-20: qty 2

## 2013-04-20 MED ORDER — ONDANSETRON 4 MG PO TBDP
8.0000 mg | ORAL_TABLET | Freq: Once | ORAL | Status: AC
  Administered 2013-04-20: 8 mg via ORAL
  Filled 2013-04-20: qty 2

## 2013-04-20 MED ORDER — ONDANSETRON HCL 4 MG/2ML IJ SOLN: 4.0000 mg | Freq: Once | INTRAMUSCULAR | Status: DC

## 2013-04-20 MED ORDER — SODIUM CHLORIDE 0.9 % IV BOLUS (SEPSIS): 1000.0000 mL | Freq: Once | INTRAVENOUS | Status: DC

## 2013-04-20 MED ORDER — ONDANSETRON HCL 4 MG PO TABS: 4.0000 mg | ORAL_TABLET | Freq: Four times a day (QID) | ORAL | Status: DC | PRN

## 2013-04-20 NOTE — ED Notes (Signed)
Pt presents to department for evaluation of L eye itching/burning and soreness under eye. Also reports she felt weak and lightheaded today during church. Denies pain at the time. Pt is conscious alert and oriented x4. Respirations unlabored.

## 2013-04-20 NOTE — ED Notes (Signed)
Pt got sick at church this am and reports weakness.  Pt states feels lightheaded.  NO chest pain.  PT states she vomited at church this am and reports coughing bad

## 2013-04-20 NOTE — ED Notes (Signed)
Patient transported to X-ray 

## 2013-04-20 NOTE — ED Provider Notes (Signed)
CSN: [id number]     Arrival date & time 04/20/13  1507 History   First MD Initiated Contact with Patient 04/20/13 1753     Chief complaint: Cough, vomiting, eye pain   (Consider location/radiation/quality/duration/timing/severity/associated sxs/prior Treatment) The history is provided by the patient.   47 year old female was at church today and she noted that she was choking on some saliva and was coughing multiple times. She also developed nausea and vomiting. When she looked at herself in the mirror she noted there was a swollen area by her left eye and she noted that there was pain in that area. She states the pain is 6/10 currently but was 8/10 earlier. She denies any drainage from right ear denies fever or chills. She denies abdominal pain or chest pain and denies constipation or diarrhea. She vomited twice. She still has ongoing nausea but does not feel like she has to cough anymore. She did not give her self any treatment at home. She denies visual change.  Past Medical History  Diagnosis Date  . Hypertension   . Hyperlipidemia   . GERD (gastroesophageal reflux disease)   . Bell's palsy may 2012  . Chest pain   . Shortness of breath   . Sleep apnea     uses cpap  . Hypothyroidism   . H/O hiatal hernia   . Shingles   . Arthritis    Past Surgical History  Procedure Laterality Date  . Thyroidectomy  1998  . Cesarian      3x  . Partial hysterectomy  2000    abnormal uterine bleeding  . Cesarean section     Family History  Problem Relation Age of Onset  . Heart attack Father 38    deceased  . Diabetes Brother   . Stroke Brother 25    twice  . Stroke Sister 62  . Diabetes Sister   . Heart attack Sister 43  . Heart attack Brother 48   History  Substance Use Topics  . Smoking status: Current Every Day Smoker -- 0.30 packs/day for 20 years    Types: Cigarettes  . Smokeless tobacco: Never Used     Comment: used to smoke 1.5 - 2 PPD  . Alcohol Use: No   OB History   Grav Para Term Preterm Abortions TAB SAB Ect Mult Living                 Review of Systems  All other systems reviewed and are negative.    Allergies  Review of patient's allergies indicates no known allergies.  Home Medications   Current Outpatient Rx  Name  Route  Sig  Dispense  Refill  . enalapril-hydrochlorothiazide (VASERETIC) 10-25 MG per tablet   Oral   Take 1 tablet by mouth at bedtime.         Marland Kitchen esomeprazole (NEXIUM) 40 MG capsule   Oral   Take 40 mg by mouth at bedtime.         Marland Kitchen levothyroxine (SYNTHROID, LEVOTHROID) 200 MCG tablet   Oral   Take 200 mcg by mouth daily before breakfast.         . oxyCODONE-acetaminophen (PERCOCET/ROXICET) 5-325 MG per tablet   Oral   Take 1 tablet by mouth every 8 (eight) hours as needed for pain.         . traMADol (ULTRAM) 50 MG tablet   Oral   Take 50 mg by mouth every 6 (six) hours as needed for pain.  BP 160/94  Pulse 76  Temp(Src) 98.5 F (36.9 C) (Oral)  Resp 18  SpO2 99% Physical Exam  Nursing note and vitals reviewed.  47 year old female, resting comfortably and in no acute distress. Vital signs are significant for hypertension with blood pressure 160/94. Oxygen saturation is 99%, which is normal. Head is normocephalic and atraumatic. PERRLA, EOMI. Oropharynx is clear. Approximately 2-3 mm area of swelling near the medial canthus of the right eye which may represent an early hordeolum. Conjunctivae do not show any injection, anterior chamber is clear, no corneal opacity. Neck is nontender and supple without adenopathy or JVD. Back is nontender and there is no CVA tenderness. Lungs are clear without rales, wheezes, or rhonchi. Chest is nontender. Heart has regular rate and rhythm without murmur. Abdomen is soft, flat, nontender without masses or hepatosplenomegaly and peristalsis is normoactive. Extremities have no cyanosis or edema, full range of motion is present. Skin is warm and dry without  rash. Neurologic: Mental status is normal, cranial nerves are intact, there are no motor or sensory deficits.  ED Course  Procedures (including critical care time) Labs Review Results for orders placed during the hospital encounter of 04/20/13  CBC      Result Value Range   WBC 6.5  4.0 - 10.5 K/uL   RBC 4.45  3.87 - 5.11 MIL/uL   Hemoglobin 13.7  12.0 - 15.0 g/dL   HCT 16.1  09.6 - 04.5 %   MCV 90.3  78.0 - 100.0 fL   MCH 30.8  26.0 - 34.0 pg   MCHC 34.1  30.0 - 36.0 g/dL   RDW 40.9  81.1 - 91.4 %   Platelets 196  150 - 400 K/uL  BASIC METABOLIC PANEL      Result Value Range   Sodium 140  135 - 145 mEq/L   Potassium 3.1 (*) 3.5 - 5.1 mEq/L   Chloride 100  96 - 112 mEq/L   CO2 31  19 - 32 mEq/L   Glucose, Bld 116 (*) 70 - 99 mg/dL   BUN 5 (*) 6 - 23 mg/dL   Creatinine, Ser 7.82  0.50 - 1.10 mg/dL   Calcium 9.3  8.4 - 95.6 mg/dL   GFR calc non Af Amer >90  >90 mL/min   GFR calc Af Amer >90  >90 mL/min  GLUCOSE, CAPILLARY      Result Value Range   Glucose-Capillary 113 (*) 70 - 99 mg/dL   Imaging Review Dg Chest 2 View  04/20/2013   *RADIOLOGY REPORT*  Clinical Data: Cough  CHEST - 2 VIEW  Comparison: 04/14/2012  Findings: The heart size is normal.  There is no pleural effusion or edema identified.  No airspace consolidation identified.  Review of the visualized bony structures is unremarkable.  IMPRESSION:  1.  No acute cardiopulmonary abnormalities.   Original Report Authenticated By: Signa Kell, M.D.    MDM   1. Nausea & vomiting   2. Cough   3. Hordeolum externum, left    Coughing episode which probably was a limited aspiration. Nausea and vomiting of uncertain cause. It does not sound like her vomiting was posttussive. Possible early hordeolum. She'll be referred to ophthalmology for further evaluation of the radial. Laboratory workup has come back significant only for hypokalemia she's given a dose of oral potassium. She's given a dose of ondansetron for nausea and  chest x-ray will be obtained to make sure she does not have evidence of aspiration.  Chest  x-ray is unremarkable. She is discharged with prescription for ondansetron and is referred to ophthalmology for followup of her eye.  Dione Booze, MD 04/20/13 Windell Moment

## 2013-05-18 ENCOUNTER — Encounter (HOSPITAL_COMMUNITY): Payer: Self-pay | Admitting: *Deleted

## 2013-05-18 ENCOUNTER — Emergency Department (HOSPITAL_COMMUNITY): Payer: PRIVATE HEALTH INSURANCE

## 2013-05-18 ENCOUNTER — Emergency Department (HOSPITAL_COMMUNITY)
Admission: EM | Admit: 2013-05-18 | Discharge: 2013-05-18 | Disposition: A | Discharge status: Home / Self Care | Payer: PRIVATE HEALTH INSURANCE | Attending: Emergency Medicine | Admitting: Emergency Medicine

## 2013-05-18 DIAGNOSIS — R079 Chest pain, unspecified: Secondary | ICD-10-CM

## 2013-05-18 DIAGNOSIS — G4730 Sleep apnea, unspecified: Secondary | ICD-10-CM | POA: Insufficient documentation

## 2013-05-18 DIAGNOSIS — R519 Headache, unspecified: Secondary | ICD-10-CM

## 2013-05-18 DIAGNOSIS — I10 Essential (primary) hypertension: Secondary | ICD-10-CM | POA: Insufficient documentation

## 2013-05-18 DIAGNOSIS — Z8619 Personal history of other infectious and parasitic diseases: Secondary | ICD-10-CM | POA: Insufficient documentation

## 2013-05-18 DIAGNOSIS — E039 Hypothyroidism, unspecified: Secondary | ICD-10-CM | POA: Insufficient documentation

## 2013-05-18 DIAGNOSIS — F17200 Nicotine dependence, unspecified, uncomplicated: Secondary | ICD-10-CM | POA: Insufficient documentation

## 2013-05-18 DIAGNOSIS — Z8669 Personal history of other diseases of the nervous system and sense organs: Secondary | ICD-10-CM | POA: Insufficient documentation

## 2013-05-18 DIAGNOSIS — R51 Headache: Secondary | ICD-10-CM | POA: Insufficient documentation

## 2013-05-18 DIAGNOSIS — Z79899 Other long term (current) drug therapy: Secondary | ICD-10-CM | POA: Insufficient documentation

## 2013-05-18 DIAGNOSIS — M5412 Radiculopathy, cervical region: Secondary | ICD-10-CM

## 2013-05-18 DIAGNOSIS — K219 Gastro-esophageal reflux disease without esophagitis: Secondary | ICD-10-CM | POA: Insufficient documentation

## 2013-05-18 DIAGNOSIS — M129 Arthropathy, unspecified: Secondary | ICD-10-CM | POA: Insufficient documentation

## 2013-05-18 LAB — POCT I-STAT TROPONIN I: Troponin i, poc: 0.01 ng/mL (ref 0.00–0.08)

## 2013-05-18 LAB — CBC
HCT: 42.3 % (ref 36.0–46.0)
MCH: 30.9 pg (ref 26.0–34.0)
MCV: 89.6 fL (ref 78.0–100.0)
Platelets: 198 10*3/uL (ref 150–400)
RBC: 4.72 MIL/uL (ref 3.87–5.11)
RDW: 13 % (ref 11.5–15.5)
WBC: 7.1 10*3/uL (ref 4.0–10.5)

## 2013-05-18 LAB — BASIC METABOLIC PANEL
BUN: 7 mg/dL (ref 6–23)
CO2: 29 mEq/L (ref 19–32)
Calcium: 9.3 mg/dL (ref 8.4–10.5)
Chloride: 101 mEq/L (ref 96–112)
Creatinine, Ser: 0.78 mg/dL (ref 0.50–1.10)

## 2013-05-18 MED ORDER — DEXAMETHASONE SODIUM PHOSPHATE 10 MG/ML IJ SOLN
10.0000 mg | Freq: Once | INTRAMUSCULAR | Status: AC
  Administered 2013-05-18: 10 mg via INTRAVENOUS
  Filled 2013-05-18: qty 1

## 2013-05-18 MED ORDER — IBUPROFEN 800 MG PO TABS: 800.0000 mg | ORAL_TABLET | Freq: Three times a day (TID) | ORAL | Status: DC

## 2013-05-18 MED ORDER — METOCLOPRAMIDE HCL 5 MG/ML IJ SOLN
10.0000 mg | Freq: Once | INTRAMUSCULAR | Status: AC
  Administered 2013-05-18: 10 mg via INTRAVENOUS
  Filled 2013-05-18: qty 2

## 2013-05-18 MED ORDER — DIPHENHYDRAMINE HCL 50 MG/ML IJ SOLN
25.0000 mg | Freq: Once | INTRAMUSCULAR | Status: AC
  Administered 2013-05-18: 25 mg via INTRAVENOUS
  Filled 2013-05-18: qty 1

## 2013-05-18 NOTE — ED Notes (Addendum)
Pt sts pain is 8/10. EDP is in room with critical patient and I informed pt she could wait until he is finished so I can ask him to get pain medication. Pt sts she doesn't want to wait for him to finish up and will take tylenol when she gets home.

## 2013-05-18 NOTE — ED Provider Notes (Signed)
CSN: [id number]     Arrival date & time 05/18/13  1700 History   First MD Initiated Contact with Patient 05/18/13 1739     Chief Complaint  Patient presents with  . Arm Pain  . Headache   (Consider location/radiation/quality/duration/timing/severity/associated sxs/prior Treatment) HPI Comments: Neck pain: Hx of degenerative disc disease in the neck. No trauma  Patient is a 47 y.o. female presenting with arm pain, headaches, and chest pain. The history is provided by the patient.  Arm Pain This is a new problem. The current episode started more than 2 days ago. The problem occurs constantly. The problem has been gradually worsening. Associated symptoms include chest pain and headaches. Pertinent negatives include no abdominal pain and no shortness of breath. Nothing aggravates the symptoms. Nothing relieves the symptoms. She has tried nothing for the symptoms.  Headache Pain location:  Frontal Quality:  Sharp Radiates to:  Does not radiate Severity currently:  8/10 Severity at highest:  8/10 Onset quality:  Sudden Timing:  Constant Progression:  Unchanged Chronicity:  New Similar to prior headaches: no   Context: bright light   Relieved by:  Nothing Worsened by:  Nothing tried Ineffective treatments:  None tried Associated symptoms: no abdominal pain and no fever   Chest Pain Associated symptoms: headache   Associated symptoms: no abdominal pain, no fever and no shortness of breath     Past Medical History  Diagnosis Date  . Hypertension   . Hyperlipidemia   . GERD (gastroesophageal reflux disease)   . Bell's palsy may 2012  . Chest pain   . Shortness of breath   . Sleep apnea     uses cpap  . Hypothyroidism   . H/O hiatal hernia   . Shingles   . Arthritis    Past Surgical History  Procedure Laterality Date  . Thyroidectomy  1998  . Cesarian      3x  . Partial hysterectomy  2000    abnormal uterine bleeding  . Cesarean section     Family History  Problem  Relation Age of Onset  . Heart attack Father 70    deceased  . Diabetes Brother   . Stroke Brother 25    twice  . Stroke Sister 17  . Diabetes Sister   . Heart attack Sister 88  . Heart attack Brother 60   History  Substance Use Topics  . Smoking status: Current Every Day Smoker -- 0.30 packs/day for 20 years    Types: Cigarettes  . Smokeless tobacco: Never Used     Comment: used to smoke 1.5 - 2 PPD  . Alcohol Use: No   OB History   Grav Para Term Preterm Abortions TAB SAB Ect Mult Living                 Review of Systems  Constitutional: Negative for fever and chills.  Respiratory: Negative for shortness of breath.   Cardiovascular: Positive for chest pain.  Gastrointestinal: Negative for abdominal pain.  Neurological: Positive for headaches.  All other systems reviewed and are negative.    Allergies  Review of patient's allergies indicates no known allergies.  Home Medications   Current Outpatient Rx  Name  Route  Sig  Dispense  Refill  . acetaminophen (TYLENOL) 325 MG tablet   Oral   Take 650 mg by mouth every 6 (six) hours as needed for pain.         Marland Kitchen enalapril-hydrochlorothiazide (VASERETIC) 10-25 MG per tablet  Oral   Take 1 tablet by mouth at bedtime.         Marland Kitchen esomeprazole (NEXIUM) 40 MG capsule   Oral   Take 40 mg by mouth at bedtime.         Marland Kitchen levothyroxine (SYNTHROID, LEVOTHROID) 200 MCG tablet   Oral   Take 200 mcg by mouth daily before breakfast.         . oxyCODONE-acetaminophen (PERCOCET/ROXICET) 5-325 MG per tablet   Oral   Take 1 tablet by mouth every 8 (eight) hours as needed for pain.         . traMADol (ULTRAM) 50 MG tablet   Oral   Take 50 mg by mouth every 6 (six) hours as needed for pain.          BP 157/82  Pulse 79  Temp(Src) 97.4 F (36.3 C) (Oral)  Resp 22  SpO2 98% Physical Exam  Nursing note and vitals reviewed. Constitutional: She is oriented to person, place, and time. She appears well-developed  and well-nourished. No distress.  HENT:  Head: Normocephalic and atraumatic.  Eyes: EOM are normal. Pupils are equal, round, and reactive to light.  Neck: Normal range of motion. Neck supple.  Cardiovascular: Normal rate and regular rhythm.  Exam reveals no friction rub.   No murmur heard. Pulmonary/Chest: Effort normal and breath sounds normal. No respiratory distress. She has no wheezes. She has no rales.  Abdominal: Soft. She exhibits no distension. There is no tenderness. There is no rebound.  Musculoskeletal: Normal range of motion. She exhibits no edema.  Neurological: She is alert and oriented to person, place, and time. No cranial nerve deficit or sensory deficit. She exhibits normal muscle tone. Coordination normal. GCS eye subscore is 4. GCS verbal subscore is 5. GCS motor subscore is 6.  Skin: She is not diaphoretic.    ED Course  Procedures (including critical care time) Labs Review Labs Reviewed  BASIC METABOLIC PANEL - Abnormal; Notable for the following:    Potassium 3.2 (*)    All other components within normal limits  CBC   Imaging Review Dg Chest 2 View  05/18/2013   CLINICAL DATA:  Short of breath. Chest pain. Headache. Arm pain.  EXAM: CHEST  2 VIEW  COMPARISON:  04/20/2013.  FINDINGS: The heart size and mediastinal contours are within normal limits. Both lungs are clear. The visualized skeletal structures are unremarkable.  IMPRESSION: No active cardiopulmonary disease.   Electronically Signed   By: Andreas Newport M.D.   On: 05/18/2013 17:47    Date: 05/18/2013  Rate: 75  Rhythm: normal sinus rhythm  QRS Axis: normal  Intervals: normal  ST/T Wave abnormalities: flipped T waves inferiorly, similar to previous  Conduction Disutrbances:none  Narrative Interpretation:   Old EKG Reviewed: unchanged   MDM   1. Headache   2. Cervical radiculopathy   3. Chest pain    47 year old female presents with multiple complaints. She has history of hypertension,  hyperlipidemia, acid reflux. Right arm pain: Patient reports right arm pain worsening over the past 3-4 days. She denies any trauma or overuse in that arm. She is right-handed. She has chronic neck pain she states is from degenerative disease.  she states some occasional numbness and tingling in her arm. Right arm exam is normal. She has no strength deficit. No sensory deficit. I will CT her C-spine at this time. Her symptoms are likely secondary to cervical radiculopathy. Headache: Patient had a headache comes in today. It  is piercing behind her eyes. She denies any nausea, vomiting, vision changes. She does not have a history of migraines or headaches. She has a normal neuro exam. I will CT her head for possible subarachnoid versus other lesion for her new headaches. CT without evidence of SAH. I explained we need LP for full r/o of SAH. Patient refused. She is comfortable taking the risk of 1-3% chance of having SAH. She is alert, of sound mind, and capable of making her own decision. HA resolved after migraine cocktail. Chest pain: She states some heaviness in her chest off and on over the past 2 days. No associated shortness of breath or nausea. She's had a stress test was performed follows her back. She also states she has history of reflux and a crit of her reflux. We'll check an EKG and do delta troponin as always initial was normal. She does have risk factors of hypertension and high cholesterol, but no known coronary artery disease.  Troponin normal. I ordered initial troponin, however patient's blood was lost. Troponin after 3 hours of observation normal. Stable for discharge.   Dagmar Hait, MD 05/18/13 [phone]

## 2013-05-18 NOTE — ED Notes (Signed)
Pt is here with right arm pain for the last 3 days-- pt has pain with moving arm up and has pain on palpation.  Pt has pain with turning head to the right increasing arm pain. Pt reports chest heaviness..  Pt reports headache

## 2014-01-27 ENCOUNTER — Encounter (HOSPITAL_COMMUNITY): Payer: Self-pay | Admitting: Emergency Medicine

## 2014-01-27 ENCOUNTER — Emergency Department (HOSPITAL_COMMUNITY): Payer: PRIVATE HEALTH INSURANCE

## 2014-01-27 ENCOUNTER — Emergency Department (HOSPITAL_COMMUNITY)
Admission: EM | Admit: 2014-01-27 | Discharge: 2014-01-27 | Disposition: A | Discharge status: Home / Self Care | Payer: PRIVATE HEALTH INSURANCE | Attending: Emergency Medicine | Admitting: Emergency Medicine

## 2014-01-27 DIAGNOSIS — E039 Hypothyroidism, unspecified: Secondary | ICD-10-CM | POA: Insufficient documentation

## 2014-01-27 DIAGNOSIS — I10 Essential (primary) hypertension: Secondary | ICD-10-CM | POA: Insufficient documentation

## 2014-01-27 DIAGNOSIS — F17200 Nicotine dependence, unspecified, uncomplicated: Secondary | ICD-10-CM | POA: Insufficient documentation

## 2014-01-27 DIAGNOSIS — G4730 Sleep apnea, unspecified: Secondary | ICD-10-CM | POA: Insufficient documentation

## 2014-01-27 DIAGNOSIS — Z8739 Personal history of other diseases of the musculoskeletal system and connective tissue: Secondary | ICD-10-CM | POA: Insufficient documentation

## 2014-01-27 DIAGNOSIS — Z8669 Personal history of other diseases of the nervous system and sense organs: Secondary | ICD-10-CM | POA: Insufficient documentation

## 2014-01-27 DIAGNOSIS — Z8619 Personal history of other infectious and parasitic diseases: Secondary | ICD-10-CM | POA: Insufficient documentation

## 2014-01-27 DIAGNOSIS — Z9981 Dependence on supplemental oxygen: Secondary | ICD-10-CM | POA: Insufficient documentation

## 2014-01-27 DIAGNOSIS — E785 Hyperlipidemia, unspecified: Secondary | ICD-10-CM | POA: Insufficient documentation

## 2014-01-27 DIAGNOSIS — Z8719 Personal history of other diseases of the digestive system: Secondary | ICD-10-CM | POA: Insufficient documentation

## 2014-01-27 DIAGNOSIS — M25569 Pain in unspecified knee: Secondary | ICD-10-CM

## 2014-01-27 DIAGNOSIS — Z79899 Other long term (current) drug therapy: Secondary | ICD-10-CM | POA: Insufficient documentation

## 2014-01-27 MED ORDER — IBUPROFEN 800 MG PO TABS: 800.0000 mg | ORAL_TABLET | Freq: Three times a day (TID) | ORAL | Status: AC

## 2014-01-27 MED ORDER — OXYCODONE-ACETAMINOPHEN 5-325 MG PO TABS: 1.0000 | ORAL_TABLET | Freq: Three times a day (TID) | ORAL | Status: DC | PRN

## 2014-01-27 MED ORDER — IBUPROFEN 800 MG PO TABS
800.0000 mg | ORAL_TABLET | Freq: Once | ORAL | Status: AC
  Administered 2014-01-27: 800 mg via ORAL
  Filled 2014-01-27: qty 1

## 2014-01-27 NOTE — ED Provider Notes (Addendum)
CSN: [id number]     Arrival date & time 01/27/14  0557 History   First MD Initiated Contact with Patient 01/27/14 [phone]     Chief Complaint  Patient presents with  . Knee Pain     (Consider location/radiation/quality/duration/timing/severity/associated sxs/prior Treatment) HPI Patient presents with concerns of ongoing weakness, clicking in her right knee. Symptoms been present for a long time. Today the patient felt weakness in the knee, fell to the floor. She denies trauma on the fall. However, the patient continues to have clicking sensation in the knee.  No distal dysesthesia or weakness, no other complaints. Patient has not seen an orthopedist for her complaints. Pain is diffuse, sore, no attempts at relief thus far.  Past Medical History  Diagnosis Date  . Hypertension   . Hyperlipidemia   . GERD (gastroesophageal reflux disease)   . Bell's palsy may 2012  . Chest pain   . Shortness of breath   . Sleep apnea     uses cpap  . Hypothyroidism   . H/O hiatal hernia   . Shingles   . Arthritis    Past Surgical History  Procedure Laterality Date  . Thyroidectomy  1998  . Cesarian      3x  . Partial hysterectomy  2000    abnormal uterine bleeding  . Cesarean section     Family History  Problem Relation Age of Onset  . Heart attack Father 63    deceased  . Diabetes Brother   . Stroke Brother 25    twice  . Stroke Sister 42  . Diabetes Sister   . Heart attack Sister 77  . Heart attack Brother 75   History  Substance Use Topics  . Smoking status: Current Every Day Smoker -- 0.30 packs/day for 20 years    Types: Cigarettes  . Smokeless tobacco: Never Used     Comment: used to smoke 1.5 - 2 PPD  . Alcohol Use: No   OB History   Grav Para Term Preterm Abortions TAB SAB Ect Mult Living                 Review of Systems  All other systems reviewed and are negative.     Allergies  Review of patient's allergies indicates no known allergies.  Home  Medications   Prior to Admission medications   Medication Sig Start Date End Date Taking? Authorizing Eniyah Eastmond  acetaminophen (TYLENOL) 325 MG tablet Take 650 mg by mouth every 6 (six) hours as needed for pain.   Yes Historical Khristine Verno, MD  enalapril-hydrochlorothiazide (VASERETIC) 10-25 MG per tablet Take 1 tablet by mouth at bedtime.   Yes Historical Creston Klas, MD  famotidine (PEPCID) 20 MG tablet Take 20 mg by mouth daily as needed for heartburn or indigestion.   Yes Historical Azharia Surratt, MD  levothyroxine (SYNTHROID, LEVOTHROID) 200 MCG tablet Take 200 mcg by mouth daily before breakfast.   Yes Historical Darl Brisbin, MD  oxyCODONE-acetaminophen (PERCOCET/ROXICET) 5-325 MG per tablet Take 1 tablet by mouth every 8 (eight) hours as needed for pain.   Yes Historical Marianne Golightly, MD  traMADol (ULTRAM) 50 MG tablet Take 50 mg by mouth every 6 (six) hours as needed for pain.   Yes Historical Janiah Devinney, MD   BP 136/84  Pulse 67  Temp(Src) 97.9 F (36.6 C) (Oral)  Resp 17  Ht 5\' 3"  (1.6 m)  Wt 246 lb (111.585 kg)  BMI 43.59 kg/m2  SpO2 100% Physical Exam  Nursing note and vitals reviewed. Constitutional:  She is oriented to person, place, and time. She appears well-developed and well-nourished. No distress.  HENT:  Head: Normocephalic and atraumatic.  Eyes: Conjunctivae and EOM are normal.  Cardiovascular: Normal rate and regular rhythm.   Pulmonary/Chest: Effort normal. No stridor. No respiratory distress.  Abdominal: She exhibits no distension.  Musculoskeletal: She exhibits no edema.       Right hip: Normal.       Right knee: She exhibits normal range of motion, no swelling, no effusion, no ecchymosis, no deformity, no laceration, no erythema, normal alignment, no LCL laxity and normal patellar mobility. Tenderness found.       Left knee: Normal.       Right ankle: Normal.       Legs: Neurological: She is alert and oriented to person, place, and time. No cranial nerve deficit.  Skin: Skin  is warm and dry.  Psychiatric: She has a normal mood and affect.    ED Course  Procedures (including critical care time)  Imaging Review Dg Knee Complete 4 Views Right  01/27/2014   CLINICAL DATA:  Pain  EXAM: RIGHT KNEE - COMPLETE 4+ VIEW  COMPARISON:  None.  FINDINGS: Frontal, lateral, and bilateral oblique views were obtained. There is no fracture, dislocation, or effusion. Joint spaces appear intact. No erosive change.  IMPRESSION: No fracture or effusion.  No appreciable arthropathy.   Electronically Signed   By: Lowella Grip M.D.   On: 01/27/2014 08:47    MDM  Patient presents with ongoing knee pain.  Notably the patient had one episode of the knee giving way today, there is no evidence of neurovascular compromise.  She description of clicking, the stability suggests cartilaginous disruption. Patient was discharged in stable condition after placement and Ace wrap, provision of crutches, to followup with orthopedics.     Carmin Muskrat, MD 01/27/14 [place], MD 01/27/14 [phone]

## 2014-01-27 NOTE — ED Notes (Signed)
Walked into room to d/c pt from monitor, continuous pulse oximetry and blood pressure cuff but pt had already taken herself off and was getting dressed; stated she was told she can go home

## 2014-01-27 NOTE — ED Notes (Signed)
Applied ACE wrap to pt's right knee.

## 2014-01-27 NOTE — ED Notes (Signed)
Applied ice pack to pt's right knee

## 2014-01-27 NOTE — ED Notes (Signed)
Pt does not want to be wheeled out; Hope, RN aware

## 2014-01-27 NOTE — ED Notes (Addendum)
Pt. reports right knee pain onset last night , denies injury , pt. stated she almost fell " my knee gave out". Ambulatory. Hypertensive at triage

## 2014-03-03 ENCOUNTER — Emergency Department (HOSPITAL_COMMUNITY): Payer: Medicare HMO

## 2014-03-03 ENCOUNTER — Encounter (HOSPITAL_COMMUNITY): Payer: Self-pay | Admitting: Emergency Medicine

## 2014-03-03 ENCOUNTER — Emergency Department (HOSPITAL_COMMUNITY)
Admission: EM | Admit: 2014-03-03 | Discharge: 2014-03-03 | Disposition: A | Discharge status: Home / Self Care | Payer: Medicare HMO | Attending: Emergency Medicine | Admitting: Emergency Medicine

## 2014-03-03 DIAGNOSIS — K219 Gastro-esophageal reflux disease without esophagitis: Secondary | ICD-10-CM | POA: Insufficient documentation

## 2014-03-03 DIAGNOSIS — M129 Arthropathy, unspecified: Secondary | ICD-10-CM | POA: Insufficient documentation

## 2014-03-03 DIAGNOSIS — G8929 Other chronic pain: Secondary | ICD-10-CM | POA: Insufficient documentation

## 2014-03-03 DIAGNOSIS — S335XXA Sprain of ligaments of lumbar spine, initial encounter: Secondary | ICD-10-CM | POA: Diagnosis not present

## 2014-03-03 DIAGNOSIS — S39012A Strain of muscle, fascia and tendon of lower back, initial encounter: Secondary | ICD-10-CM

## 2014-03-03 DIAGNOSIS — Y9229 Other specified public building as the place of occurrence of the external cause: Secondary | ICD-10-CM | POA: Diagnosis not present

## 2014-03-03 DIAGNOSIS — Z8619 Personal history of other infectious and parasitic diseases: Secondary | ICD-10-CM | POA: Insufficient documentation

## 2014-03-03 DIAGNOSIS — Y9301 Activity, walking, marching and hiking: Secondary | ICD-10-CM | POA: Diagnosis not present

## 2014-03-03 DIAGNOSIS — W010XXA Fall on same level from slipping, tripping and stumbling without subsequent striking against object, initial encounter: Secondary | ICD-10-CM | POA: Insufficient documentation

## 2014-03-03 DIAGNOSIS — Z8669 Personal history of other diseases of the nervous system and sense organs: Secondary | ICD-10-CM | POA: Insufficient documentation

## 2014-03-03 DIAGNOSIS — Z90711 Acquired absence of uterus with remaining cervical stump: Secondary | ICD-10-CM | POA: Insufficient documentation

## 2014-03-03 DIAGNOSIS — E039 Hypothyroidism, unspecified: Secondary | ICD-10-CM | POA: Insufficient documentation

## 2014-03-03 DIAGNOSIS — F17200 Nicotine dependence, unspecified, uncomplicated: Secondary | ICD-10-CM | POA: Insufficient documentation

## 2014-03-03 DIAGNOSIS — IMO0002 Reserved for concepts with insufficient information to code with codable children: Secondary | ICD-10-CM | POA: Diagnosis present

## 2014-03-03 DIAGNOSIS — Z9089 Acquired absence of other organs: Secondary | ICD-10-CM | POA: Insufficient documentation

## 2014-03-03 DIAGNOSIS — Z862 Personal history of diseases of the blood and blood-forming organs and certain disorders involving the immune mechanism: Secondary | ICD-10-CM | POA: Insufficient documentation

## 2014-03-03 DIAGNOSIS — I10 Essential (primary) hypertension: Secondary | ICD-10-CM | POA: Diagnosis not present

## 2014-03-03 DIAGNOSIS — Z8639 Personal history of other endocrine, nutritional and metabolic disease: Secondary | ICD-10-CM | POA: Insufficient documentation

## 2014-03-03 MED ORDER — METHOCARBAMOL 500 MG PO TABS: 500.0000 mg | ORAL_TABLET | Freq: Two times a day (BID) | ORAL | Status: DC

## 2014-03-03 NOTE — ED Provider Notes (Signed)
CSN: [id number]     Arrival date & time 03/03/14  1848 History  This chart was scribed for Jeannett Senior, PA-C working with Wandra Arthurs, MD by Randa Evens, ED Scribe. This patient was seen in room TR09C/TR09C and the patient's care was started at 7:08 PM.      Chief Complaint  Patient presents with  . Fall  . Back Pain   The history is provided by the patient. No language interpreter was used.   Pertinent negatives include no abdominal pain and no headaches.   HPI Comments: Teresa Mcgee is a 48 y.o. female who presents to the Emergency Department complaining of fall onset 4:45PM. She states she was walking in the grocery store and slipped on water. She states she has associated back pain that radiates down her left leg which she has had before. She states she may have pulled a muscle in her back. She states she hasn't taken any medication prior to arrival. She denies abdominal pain or bowel/bladder incontinence. She states she has a history of back problems and sciatica. No other injuries.   Past Medical History  Diagnosis Date  . Hypertension   . Hyperlipidemia   . GERD (gastroesophageal reflux disease)   . Bell's palsy may 2012  . Chest pain   . Shortness of breath   . Sleep apnea     uses cpap  . Hypothyroidism   . H/O hiatal hernia   . Shingles   . Arthritis    Past Surgical History  Procedure Laterality Date  . Thyroidectomy  1998  . Cesarian      3x  . Partial hysterectomy  2000    abnormal uterine bleeding  . Cesarean section     Family History  Problem Relation Age of Onset  . Heart attack Father 50    deceased  . Diabetes Brother   . Stroke Brother 25    twice  . Stroke Sister 46  . Diabetes Sister   . Heart attack Sister 102  . Heart attack Brother 67   History  Substance Use Topics  . Smoking status: Current Every Day Smoker -- 0.30 packs/day for 20 years    Types: Cigarettes  . Smokeless tobacco: Never Used     Comment: used to smoke 1.5  - 2 PPD  . Alcohol Use: No   OB History   Grav Para Term Preterm Abortions TAB SAB Ect Mult Living                 Review of Systems  Gastrointestinal: Negative for abdominal pain.  Musculoskeletal: Positive for arthralgias and back pain.  Skin: Negative for wound.  Neurological: Negative for headaches.   Allergies  Review of patient's allergies indicates no known allergies.  Home Medications   Prior to Admission medications   Medication Sig Start Date End Date Taking? Authorizing Provider  acetaminophen (TYLENOL) 325 MG tablet Take 650 mg by mouth every 6 (six) hours as needed for pain.    Historical Provider, MD  enalapril-hydrochlorothiazide (VASERETIC) 10-25 MG per tablet Take 1 tablet by mouth at bedtime.    Historical Provider, MD  famotidine (PEPCID) 20 MG tablet Take 20 mg by mouth daily as needed for heartburn or indigestion.    Historical Provider, MD  levothyroxine (SYNTHROID, LEVOTHROID) 200 MCG tablet Take 200 mcg by mouth daily before breakfast.    Historical Provider, MD  oxyCODONE-acetaminophen (PERCOCET/ROXICET) 5-325 MG per tablet Take 1 tablet by mouth every 8 (eight)  hours as needed. 01/27/14   Carmin Muskrat, MD  traMADol (ULTRAM) 50 MG tablet Take 50 mg by mouth every 6 (six) hours as needed for pain.    Historical Provider, MD   Triage vitals: BP 161/90  Pulse 73  Temp(Src) 98.1 F (36.7 C) (Oral)  Resp 18  Wt 253 lb 9 oz (115.015 kg)  SpO2 98%  Physical Exam  Nursing note and vitals reviewed. Constitutional: She is oriented to person, place, and time. She appears well-developed and well-nourished. No distress.  HENT:  Head: Normocephalic and atraumatic.  Eyes: Conjunctivae and EOM are normal.  Neck: Neck supple. No tracheal deviation present.  Cardiovascular: Normal rate.   Pulmonary/Chest: Effort normal. No respiratory distress.  Abdominal: There is no tenderness.  Musculoskeletal: Normal range of motion.  Midline lumbar spine tenderness. Left  and right paravertebral tenderness. No pain with straight-leg raise.  Neurological: She is alert and oriented to person, place, and time.  5/5 and equal lower extremity strength. 2+ and equal patellar reflexes bilaterally. Pt able to dorsiflex bilateral toes and feet with good strength against resistance. Equal sensation bilaterally over thighs and lower legs.   Skin: Skin is warm and dry.  Psychiatric: She has a normal mood and affect. Her behavior is normal.    ED Course  Procedures (including critical care time) DIAGNOSTIC STUDIES: Oxygen Saturation is 98% on RA, normal by my interpretation.    COORDINATION OF CARE:    Labs Review Labs Reviewed - No data to display  Imaging Review Dg Lumbar Spine Complete  03/03/2014   CLINICAL DATA:  Lower back pain post fall  EXAM: LUMBAR SPINE - COMPLETE 4+ VIEW  COMPARISON:  10/10/2010  FINDINGS: Five views of lumbar spine submitted. No acute fracture or subluxation. Again noted mild degenerative changes and T11-T12 level.  IMPRESSION: No acute fracture or subluxation. Mild degenerative changes lower thoracic spine.   Electronically Signed   By: Lahoma Crocker M.D.   On: 03/03/2014 20:58     EKG Interpretation None      MDM   Final diagnoses:  Lumbar strain, initial encounter    Patient with lower back pain after a fall. History of chronic back pain, currently takes oxycodone 5 mg tablets for pain twice a day. She denies any other injuries. X-ray is negative. I suspect this is due to a muscular strain. She has no neurological symptoms, no signs of cauda equina. Home with muscle relaxant in addition to Percocet that she already is on. Followup with primary care Dr.  Danley Danker Vitals:   03/03/14 1857  BP: 161/90  Pulse: 73  Temp: 98.1 F (36.7 C)  TempSrc: Oral  Resp: 18  Weight: 253 lb 9 oz (115.015 kg)  SpO2: 98%       I personally performed the services described in this documentation, which was scribed in my presence. The  recorded information has been reviewed and is accurate.      Renold Genta, PA-C 03/04/14 0136

## 2014-03-03 NOTE — ED Notes (Signed)
Pt to xray at this time.

## 2014-03-03 NOTE — ED Notes (Signed)
Pt reports slipping on water and falling, now having lower back pain. Hx of back problems. Ambulatory at triage.

## 2014-03-03 NOTE — Discharge Instructions (Signed)
Continue to take oxycodone for pain. Take Robaxin as prescribed as needed for muscle spasms. See exercises below, stretched daily. Follow with primary care doctor.   Lumbosacral Strain Lumbosacral strain is a strain of any of the parts that make up your lumbosacral vertebrae. Your lumbosacral vertebrae are the bones that make up the lower third of your backbone. Your lumbosacral vertebrae are held together by muscles and tough, fibrous tissue (ligaments).  CAUSES  A sudden blow to your back can cause lumbosacral strain. Also, anything that causes an excessive stretch of the muscles in the low back can cause this strain. This is typically seen when people exert themselves strenuously, fall, lift heavy objects, bend, or crouch repeatedly. RISK FACTORS  Physically demanding work.  Participation in pushing or pulling sports or sports that require a sudden twist of the back (tennis, golf, baseball).  Weight lifting.  Excessive lower back curvature.  Forward-tilted pelvis.  Weak back or abdominal muscles or both.  Tight hamstrings. SIGNS AND SYMPTOMS  Lumbosacral strain may cause pain in the area of your injury or pain that moves (radiates) down your leg.  DIAGNOSIS Your health care provider can often diagnose lumbosacral strain through a physical exam. In some cases, you may need tests such as X-ray exams.  TREATMENT  Treatment for your lower back injury depends on many factors that your clinician will have to evaluate. However, most treatment will include the use of anti-inflammatory medicines. HOME CARE INSTRUCTIONS   Avoid hard physical activities (tennis, racquetball, waterskiing) if you are not in proper physical condition for it. This may aggravate or create problems.  If you have a back problem, avoid sports requiring sudden body movements. Swimming and walking are generally safer activities.  Maintain good posture.  Maintain a healthy weight.  For acute conditions, you may put  ice on the injured area.  Put ice in a plastic bag.  Place a towel between your skin and the bag.  Leave the ice on for 20 minutes, 2-3 times a day.  When the low back starts healing, stretching and strengthening exercises may be recommended. SEEK MEDICAL CARE IF:  Your back pain is getting worse.  You experience severe back pain not relieved with medicines. SEEK IMMEDIATE MEDICAL CARE IF:   You have numbness, tingling, weakness, or problems with the use of your arms or legs.  There is a change in bowel or bladder control.  You have increasing pain in any area of the body, including your belly (abdomen).  You notice shortness of breath, dizziness, or feel faint.  You feel sick to your stomach (nauseous), are throwing up (vomiting), or become sweaty.  You notice discoloration of your toes or legs, or your feet get very cold. MAKE SURE YOU:   Understand these instructions.  Will watch your condition.  Will get help right away if you are not doing well or get worse. Document Released: 05/10/2005 Document Revised: 08/05/2013 Document Reviewed: 03/19/2013 [facility] Patient Information 2015 [place], [place]. This information is not intended to replace advice given to you by your health care provider. Make sure you discuss any questions you have with your health care provider.    Back Exercises Back exercises help treat and prevent back injuries. The goal of back exercises is to increase the strength of your abdominal and back muscles and the flexibility of your back. These exercises should be started when you no longer have back pain. Back exercises include:  Pelvic Tilt. Lie on your back with your  knees bent. Tilt your pelvis until the lower part of your back is against the floor. Hold this position 5 to 10 sec and repeat 5 to 10 times.  Knee to Chest. Pull first 1 knee up against your chest and hold for 20 to 30 seconds, repeat this with the other knee, and then both knees. This  may be done with the other leg straight or bent, whichever feels better.  Sit-Ups or Curl-Ups. Bend your knees 90 degrees. Start with tilting your pelvis, and do a partial, slow sit-up, lifting your trunk only 30 to 45 degrees off the floor. Take at least 2 to 3 seconds for each sit-up. Do not do sit-ups with your knees out straight. If partial sit-ups are difficult, simply do the above but with only tightening your abdominal muscles and holding it as directed.  Hip-Lift. Lie on your back with your knees flexed 90 degrees. Push down with your feet and shoulders as you raise your hips a couple inches off the floor; hold for 10 seconds, repeat 5 to 10 times.  Back arches. Lie on your stomach, propping yourself up on bent elbows. Slowly press on your hands, causing an arch in your low back. Repeat 3 to 5 times. Any initial stiffness and discomfort should lessen with repetition over time.  Shoulder-Lifts. Lie face down with arms beside your body. Keep hips and torso pressed to floor as you slowly lift your head and shoulders off the floor. Do not overdo your exercises, especially in the beginning. Exercises may cause you some mild back discomfort which lasts for a few minutes; however, if the pain is more severe, or lasts for more than 15 minutes, do not continue exercises until you see your caregiver. Improvement with exercise therapy for back problems is slow.  See your caregivers for assistance with developing a proper back exercise program. Document Released: 09/07/2004 Document Revised: 10/23/2011 Document Reviewed: 06/01/2011 [facility] Patient Information 2015 [place], [place]. This information is not intended to replace advice given to you by your health care provider. Make sure you discuss any questions you have with your health care provider.

## 2014-03-05 NOTE — ED Provider Notes (Signed)
Medical screening examination/treatment/procedure(s) were performed by non-physician practitioner and as supervising physician I was immediately available for consultation/collaboration.   EKG Interpretation None        Wandra Arthurs, MD 03/05/14 1101

## 2014-03-14 ENCOUNTER — Encounter (HOSPITAL_COMMUNITY): Payer: Self-pay | Admitting: Emergency Medicine

## 2014-03-14 ENCOUNTER — Emergency Department (HOSPITAL_COMMUNITY)
Admission: EM | Admit: 2014-03-14 | Discharge: 2014-03-14 | Disposition: A | Discharge status: Home / Self Care | Payer: Medicare HMO | Attending: Emergency Medicine | Admitting: Emergency Medicine

## 2014-03-14 DIAGNOSIS — K219 Gastro-esophageal reflux disease without esophagitis: Secondary | ICD-10-CM | POA: Insufficient documentation

## 2014-03-14 DIAGNOSIS — Z8669 Personal history of other diseases of the nervous system and sense organs: Secondary | ICD-10-CM | POA: Insufficient documentation

## 2014-03-14 DIAGNOSIS — F17200 Nicotine dependence, unspecified, uncomplicated: Secondary | ICD-10-CM | POA: Diagnosis not present

## 2014-03-14 DIAGNOSIS — I10 Essential (primary) hypertension: Secondary | ICD-10-CM | POA: Insufficient documentation

## 2014-03-14 DIAGNOSIS — Z8619 Personal history of other infectious and parasitic diseases: Secondary | ICD-10-CM | POA: Diagnosis not present

## 2014-03-14 DIAGNOSIS — E039 Hypothyroidism, unspecified: Secondary | ICD-10-CM | POA: Diagnosis not present

## 2014-03-14 DIAGNOSIS — L03112 Cellulitis of left axilla: Secondary | ICD-10-CM

## 2014-03-14 DIAGNOSIS — Z79899 Other long term (current) drug therapy: Secondary | ICD-10-CM | POA: Diagnosis not present

## 2014-03-14 DIAGNOSIS — IMO0002 Reserved for concepts with insufficient information to code with codable children: Secondary | ICD-10-CM | POA: Diagnosis not present

## 2014-03-14 DIAGNOSIS — M129 Arthropathy, unspecified: Secondary | ICD-10-CM | POA: Diagnosis not present

## 2014-03-14 DIAGNOSIS — L02412 Cutaneous abscess of left axilla: Secondary | ICD-10-CM

## 2014-03-14 MED ORDER — DOXYCYCLINE HYCLATE 100 MG PO CAPS: 100.0000 mg | ORAL_CAPSULE | Freq: Two times a day (BID) | ORAL | Status: DC

## 2014-03-14 NOTE — ED Provider Notes (Signed)
CSN: [id number]     Arrival date & time 03/14/14  1411 History   First MD Initiated Contact with Patient 03/14/14 1429     Chief Complaint  Patient presents with  . Abscess   HPI Pt is 48 yo AA female presents with localized area of redness, swelling, pain and induration under lt axilla X 7 days.  Pt denies fever, shaking or chills.  Area is tender to palpation, without drainage and unchanged despite warm compresses and a salted fatback home remedy.  Pt does not have a hx of similar abscesses but her son has had them in the past requiring incision and drainage.    Past Medical History  Diagnosis Date  . Hypertension   . Hyperlipidemia   . GERD (gastroesophageal reflux disease)   . Bell's palsy may 2012  . Chest pain   . Shortness of breath   . Sleep apnea     uses cpap  . Hypothyroidism   . H/O hiatal hernia   . Shingles   . Arthritis    Past Surgical History  Procedure Laterality Date  . Thyroidectomy  1998  . Cesarian      3x  . Partial hysterectomy  2000    abnormal uterine bleeding  . Cesarean section     Family History  Problem Relation Age of Onset  . Heart attack Father 59    deceased  . Diabetes Brother   . Stroke Brother 25    twice  . Stroke Sister 28  . Diabetes Sister   . Heart attack Sister 45  . Heart attack Brother 25   History  Substance Use Topics  . Smoking status: Current Every Day Smoker -- 0.30 packs/day for 20 years    Types: Cigarettes  . Smokeless tobacco: Never Used     Comment: used to smoke 1.5 - 2 PPD  . Alcohol Use: No   OB History   Grav Para Term Preterm Abortions TAB SAB Ect Mult Living                 Review of Systems  Constitutional: Negative for fever and chills.  Eyes: Positive for pain, discharge, redness and itching.       Eye complaints are ongoing and currently being treated by PCP.   All other systems reviewed and are negative.     Allergies  Review of patient's allergies indicates no known allergies.  Home  Medications   Prior to Admission medications   Medication Sig Start Date End Date Taking? Authorizing Provider  acetaminophen (TYLENOL) 325 MG tablet Take 650 mg by mouth every 6 (six) hours as needed for pain.    Historical Provider, MD  enalapril-hydrochlorothiazide (VASERETIC) 10-25 MG per tablet Take 1 tablet by mouth at bedtime.    Historical Provider, MD  famotidine (PEPCID) 20 MG tablet Take 20 mg by mouth daily as needed for heartburn or indigestion.    Historical Provider, MD  levothyroxine (SYNTHROID, LEVOTHROID) 200 MCG tablet Take 200 mcg by mouth daily before breakfast.    Historical Provider, MD  methocarbamol (ROBAXIN) 500 MG tablet Take 1 tablet (500 mg total) by mouth 2 (two) times daily. 03/03/14   Tatyana A Kirichenko, PA-C  oxyCODONE-acetaminophen (PERCOCET/ROXICET) 5-325 MG per tablet Take 1 tablet by mouth every 8 (eight) hours as needed. 01/27/14   Carmin Muskrat, MD  traMADol (ULTRAM) 50 MG tablet Take 50 mg by mouth every 6 (six) hours as needed for pain.    Historical Provider,  MD   BP 149/95  Pulse 86  Temp(Src) 98.1 F (36.7 C) (Oral)  Resp 22  SpO2 97% Physical Exam  Nursing note and vitals reviewed. Constitutional: She is oriented to person, place, and time. Vital signs are normal. She appears well-developed and well-nourished. She is cooperative. No distress.  HENT:  Head: Normocephalic and atraumatic.  Eyes: EOM and lids are normal. Pupils are equal, round, and reactive to light. Right conjunctiva is injected. Left conjunctiva is injected. No scleral icterus.  Neck: Normal range of motion. Neck supple.  Cardiovascular: Intact distal pulses.   Pulmonary/Chest: Effort normal.  Abdominal: Soft.  Musculoskeletal: Normal range of motion.  Neurological: She is alert and oriented to person, place, and time.  Skin: Skin is warm and dry. She is not diaphoretic. There is erythema.     Noted area approximately 3 cm in diameter of redness and induration. Tender to  palpation, without drainage.    Psychiatric: She has a normal mood and affect. Her behavior is normal.    ED Course  Procedures  Labs Review Labs Reviewed - No data to display  Imaging Review No results found.   EKG Interpretation None     INCISION AND DRAINAGE Performed by: Britt Bottom Consent: Verbal consent obtained. Risks and benefits: risks, benefits and alternatives were discussed Type: abscess  Body area: Left axilla  Anesthesia: local infiltration  Incision was made with a scalpel.  Local anesthetic: lidocaine 1% with epinephrine  Anesthetic total: 5 ml  Complexity: complex Blunt dissection to break up loculations  Drainage: purulent  Drainage amount: small   Packing material: none  Patient tolerance: Patient tolerated the procedure well with no immediate complications.   MDM   Final diagnoses:  Abscess of axilla, left  Cellulitis of left axilla   Pt presents with 7 day hx of pain, swelling, redness and induration to left axilla.  Pt had a similar area under her rt axilla recently but it resolved on its own.  The area under her left arm however has not improved despite using warm compresses and salted fatback home remedy.  She has never needed an incision and drainage of an abscess before.  She was told a few years ago that she was pre-diabetic but has not had further follow-up.  She denies fever or chills or other signs of systemic infection.  A bedside ultrasound was performed by Clayton Bibles, PA-C, which revealed a small to moderate collection of fluid.  An incision and drainage procedure was done and a dry dressing placed with instructions to change as needed.  She was also given a prescription for doxycycline for the associated cellulitis.  Return precautions and follow-up instructions were discussed.         Britt Bottom, NP 03/14/14 [phone]

## 2014-03-14 NOTE — ED Notes (Signed)
Declined W/C at D/C and was escorted to lobby by RN. 

## 2014-03-14 NOTE — Discharge Instructions (Signed)
You were seen today for treatment of the red, painful area in your underarm.  This area is called an abscess and it was necessary to open up the area to allow the infection underneath to drain out.  Keep the area clean and dry and replace the dressing that is on it as needed.  Do not put any antibiotic ointment on this area as it is important to drain.  You were given a prescription for antibiotics (doxycycline) to treat the infection.  Be sure to take the all the medicine until it is all gone.  Follow-up with your primary physician regarding your abscess or any chronic illness that my cause abscesses to reoccur, such as diabetes.  Do not hesitate to call 911 or return to the emergency department if you have any shortness of breath, hives, itching or other signs of allergic reaction when taking your antibiotic.  Also, return to be seen for any fever, chills or worsening symptoms related to your abscess.    Abscess Care After An abscess (also called a boil or furuncle) is an infected area that contains a collection of pus. Signs and symptoms of an abscess include pain, tenderness, redness, or hardness, or you may feel a moveable soft area under your skin. An abscess can occur anywhere in the body. The infection may spread to surrounding tissues causing cellulitis. A cut (incision) by the surgeon was made over your abscess and the pus was drained out. Gauze may have been packed into the space to provide a drain that will allow the cavity to heal from the inside outwards. The boil may be painful for 5 to 7 days. Most people with a boil do not have high fevers. Your abscess, if seen early, may not have localized, and may not have been lanced. If not, another appointment may be required for this if it does not get better on its own or with medications. HOME CARE INSTRUCTIONS   Only take over-the-counter or prescription medicines for pain, discomfort, or fever as directed by your caregiver.  When you bathe, soak  and then remove gauze or iodoform packs at least daily or as directed by your caregiver. You may then wash the wound gently with mild soapy water. Repack with gauze or do as your caregiver directs. SEEK IMMEDIATE MEDICAL CARE IF:   You develop increased pain, swelling, redness, drainage, or bleeding in the wound site.  You develop signs of generalized infection including muscle aches, chills, fever, or a general ill feeling.  An oral temperature above 102 F (38.9 C) develops, not controlled by medication. See your caregiver for a recheck if you develop any of the symptoms described above. If medications (antibiotics) were prescribed, take them as directed. Document Released: 02/16/2005 Document Revised: 10/23/2011 Document Reviewed: 10/14/2007 [facility] Patient Information 2015 [place], [place]. This information is not intended to replace advice given to you by your health care provider. Make sure you discuss any questions you have with your health care provider.

## 2014-03-14 NOTE — ED Notes (Signed)
Pt reports abscess to L armpit. No drainage noted. Area red and tender to touch. No fevers.

## 2014-03-15 NOTE — ED Provider Notes (Signed)
Medical screening examination/treatment/procedure(s) were performed by non-physician practitioner and as supervising physician I was immediately available for consultation/collaboration.  Neta Ehlers, MD 03/15/14 [phone]

## 2014-05-01 ENCOUNTER — Emergency Department (HOSPITAL_COMMUNITY)
Admission: EM | Admit: 2014-05-01 | Discharge: 2014-05-02 | Disposition: A | Discharge status: Home / Self Care | Payer: Medicare HMO | Attending: Emergency Medicine | Admitting: Emergency Medicine

## 2014-05-01 ENCOUNTER — Encounter (HOSPITAL_COMMUNITY): Payer: Self-pay | Admitting: Emergency Medicine

## 2014-05-01 DIAGNOSIS — Z8719 Personal history of other diseases of the digestive system: Secondary | ICD-10-CM | POA: Insufficient documentation

## 2014-05-01 DIAGNOSIS — Z79899 Other long term (current) drug therapy: Secondary | ICD-10-CM | POA: Insufficient documentation

## 2014-05-01 DIAGNOSIS — Z9889 Other specified postprocedural states: Secondary | ICD-10-CM | POA: Diagnosis not present

## 2014-05-01 DIAGNOSIS — I10 Essential (primary) hypertension: Secondary | ICD-10-CM | POA: Diagnosis not present

## 2014-05-01 DIAGNOSIS — Z90711 Acquired absence of uterus with remaining cervical stump: Secondary | ICD-10-CM | POA: Diagnosis not present

## 2014-05-01 DIAGNOSIS — Z8669 Personal history of other diseases of the nervous system and sense organs: Secondary | ICD-10-CM | POA: Insufficient documentation

## 2014-05-01 DIAGNOSIS — Z8739 Personal history of other diseases of the musculoskeletal system and connective tissue: Secondary | ICD-10-CM | POA: Diagnosis not present

## 2014-05-01 DIAGNOSIS — Z8619 Personal history of other infectious and parasitic diseases: Secondary | ICD-10-CM | POA: Diagnosis not present

## 2014-05-01 DIAGNOSIS — R109 Unspecified abdominal pain: Secondary | ICD-10-CM | POA: Diagnosis not present

## 2014-05-01 DIAGNOSIS — E039 Hypothyroidism, unspecified: Secondary | ICD-10-CM | POA: Insufficient documentation

## 2014-05-01 DIAGNOSIS — F17200 Nicotine dependence, unspecified, uncomplicated: Secondary | ICD-10-CM | POA: Insufficient documentation

## 2014-05-01 LAB — URINALYSIS, ROUTINE W REFLEX MICROSCOPIC
Bilirubin Urine: NEGATIVE
GLUCOSE, UA: NEGATIVE mg/dL
KETONES UR: NEGATIVE mg/dL
Leukocytes, UA: NEGATIVE
Nitrite: NEGATIVE
Protein, ur: NEGATIVE mg/dL
Specific Gravity, Urine: 1.008 (ref 1.005–1.030)
Urobilinogen, UA: 1 mg/dL (ref 0.0–1.0)
pH: 6 (ref 5.0–8.0)

## 2014-05-01 LAB — CBC WITH DIFFERENTIAL/PLATELET
BASOS ABS: 0 10*3/uL (ref 0.0–0.1)
Basophils Relative: 0 % (ref 0–1)
Eosinophils Absolute: 0.3 10*3/uL (ref 0.0–0.7)
Eosinophils Relative: 3 % (ref 0–5)
HCT: 41.2 % (ref 36.0–46.0)
HEMOGLOBIN: 14.1 g/dL (ref 12.0–15.0)
LYMPHS PCT: 39 % (ref 12–46)
Lymphs Abs: 3.2 10*3/uL (ref 0.7–4.0)
MCH: 30.7 pg (ref 26.0–34.0)
MCHC: 34.2 g/dL (ref 30.0–36.0)
MCV: 89.8 fL (ref 78.0–100.0)
MONO ABS: 0.5 10*3/uL (ref 0.1–1.0)
Monocytes Relative: 6 % (ref 3–12)
NEUTROS ABS: 4.3 10*3/uL (ref 1.7–7.7)
Neutrophils Relative %: 52 % (ref 43–77)
Platelets: 205 10*3/uL (ref 150–400)
RBC: 4.59 MIL/uL (ref 3.87–5.11)
RDW: 12.9 % (ref 11.5–15.5)
WBC: 8.2 10*3/uL (ref 4.0–10.5)

## 2014-05-01 LAB — COMPREHENSIVE METABOLIC PANEL
ALT: 28 U/L (ref 0–35)
AST: 32 U/L (ref 0–37)
Albumin: 3.8 g/dL (ref 3.5–5.2)
Alkaline Phosphatase: 78 U/L (ref 39–117)
Anion gap: 11 (ref 5–15)
BILIRUBIN TOTAL: 0.3 mg/dL (ref 0.3–1.2)
BUN: 7 mg/dL (ref 6–23)
CHLORIDE: 101 meq/L (ref 96–112)
CO2: 30 meq/L (ref 19–32)
CREATININE: 0.83 mg/dL (ref 0.50–1.10)
Calcium: 9.2 mg/dL (ref 8.4–10.5)
GFR calc Af Amer: >90 mL/min (ref >90)
GFR calc non Af Amer: 83 mL/min — ABNORMAL LOW (ref >90)
Glucose, Bld: 77 mg/dL (ref 70–99)
POTASSIUM: 3.5 meq/L — AB (ref 3.7–5.3)
Sodium: 142 mEq/L (ref 137–147)
Total Protein: 7.9 g/dL (ref 6.0–8.3)

## 2014-05-01 LAB — URINE MICROSCOPIC-ADD ON

## 2014-05-01 MED ORDER — ONDANSETRON 4 MG PO TBDP
8.0000 mg | ORAL_TABLET | Freq: Once | ORAL | Status: AC
  Administered 2014-05-01: 8 mg via ORAL
  Filled 2014-05-01: qty 2

## 2014-05-01 MED ORDER — OXYCODONE-ACETAMINOPHEN 5-325 MG PO TABS
1.0000 | ORAL_TABLET | Freq: Once | ORAL | Status: AC
  Administered 2014-05-01: 1 via ORAL
  Filled 2014-05-01: qty 1

## 2014-05-01 NOTE — ED Notes (Signed)
The pt is c/o sharp rt flank pain and down into her rt abd since 18800 ronight.  No n v

## 2014-05-02 ENCOUNTER — Emergency Department (HOSPITAL_COMMUNITY): Payer: Medicare HMO

## 2014-05-02 DIAGNOSIS — R109 Unspecified abdominal pain: Secondary | ICD-10-CM | POA: Diagnosis not present

## 2014-05-02 NOTE — ED Provider Notes (Signed)
CSN: [id number]     Arrival date & time 05/01/14  2215 History   First MD Initiated Contact with Patient 05/01/14 2353     Chief Complaint  Patient presents with  . Flank Pain      Patient is a 48 y.o. female presenting with flank pain. The history is provided by the patient.  Flank Pain This is a new problem. The current episode started 3 to 5 hours ago. The problem occurs constantly. Associated symptoms include abdominal pain. Pertinent negatives include no chest pain and no shortness of breath. Nothing aggravates the symptoms. Nothing relieves the symptoms.  Pt presents for right flank pain.  She reports the pain radiates into right abdomen She has never had this before She denies h/o kidney stones No fever/vomiting No cp/sob No dysuria or difficulty urinating is reported  Past Medical History  Diagnosis Date  . Hypertension   . Hyperlipidemia   . GERD (gastroesophageal reflux disease)   . Bell's palsy may 2012  . Chest pain   . Shortness of breath   . Sleep apnea     uses cpap  . Hypothyroidism   . H/O hiatal hernia   . Shingles   . Arthritis    Past Surgical History  Procedure Laterality Date  . Thyroidectomy  1998  . Cesarian      3x  . Partial hysterectomy  2000    abnormal uterine bleeding  . Cesarean section     Family History  Problem Relation Age of Onset  . Heart attack Father 62    deceased  . Diabetes Brother   . Stroke Brother 25    twice  . Stroke Sister 30  . Diabetes Sister   . Heart attack Sister 46  . Heart attack Brother 48   History  Substance Use Topics  . Smoking status: Current Every Day Smoker -- 0.30 packs/day for 20 years    Types: Cigarettes  . Smokeless tobacco: Never Used     Comment: used to smoke 1.5 - 2 PPD  . Alcohol Use: No   OB History   Grav Para Term Preterm Abortions TAB SAB Ect Mult Living                 Review of Systems  Constitutional: Negative for fever.  Respiratory: Negative for shortness of breath.    Cardiovascular: Negative for chest pain.  Gastrointestinal: Positive for abdominal pain.  Genitourinary: Positive for flank pain. Negative for dysuria.  Neurological: Negative for weakness.  All other systems reviewed and are negative.     Allergies  Review of patient's allergies indicates no known allergies.  Home Medications   Prior to Admission medications   Medication Sig Start Date End Date Taking? Authorizing Provider  enalapril-hydrochlorothiazide (VASERETIC) 10-25 MG per tablet Take 1 tablet by mouth at bedtime.   Yes Historical Provider, MD  levothyroxine (SYNTHROID, LEVOTHROID) 200 MCG tablet Take 200 mcg by mouth daily before breakfast.   Yes Historical Provider, MD  methocarbamol (ROBAXIN) 500 MG tablet Take 1 tablet (500 mg total) by mouth 2 (two) times daily. 03/03/14  Yes Tatyana A Kirichenko, PA-C   BP 156/88  Pulse 81  Temp(Src) 97.9 F (36.6 C) (Oral)  Resp 24  Ht 5\' 3"  (1.6 m)  Wt 253 lb (114.76 kg)  BMI 44.83 kg/m2  SpO2 98% Physical Exam CONSTITUTIONAL: Well developed/well nourished HEAD: Normocephalic/atraumatic EYES: EOMI/PERRL ENMT: Mucous membranes moist NECK: supple no meningeal signs SPINE:entire spine nontender CV: S1/S2 noted,  no murmurs/rubs/gallops noted LUNGS: Lungs are clear to auscultation bilaterally, no apparent distress ABDOMEN: soft, nontender, no rebound or guarding KX:FGHWE cva tenderness NEURO: Pt is awake/alert, moves all extremitiesx4 EXTREMITIES: pulses normal, full ROM SKIN: warm, color normal PSYCH: no abnormalities of mood noted  ED Course  Procedures  12:04 AM Pt with sudden onset of right flank pain Will proceed with CT imaging Pt does not request any pain meds at this time 1:02 AM CT negative Pt is well appearing, no distress  we discussed strict return precautions Appropriate for d/c home  Labs Review Labs Reviewed  COMPREHENSIVE METABOLIC PANEL - Abnormal; Notable for the following:    Potassium 3.5 (*)     GFR calc non Af Amer 83 (*)    All other components within normal limits  URINALYSIS, ROUTINE W REFLEX MICROSCOPIC - Abnormal; Notable for the following:    Hgb urine dipstick SMALL (*)    All other components within normal limits  CBC WITH DIFFERENTIAL  URINE MICROSCOPIC-ADD ON    Imaging Review Ct Renal Stone Study  05/02/2014   CLINICAL DATA:  Sharp right flank pain, extending into the right side of the abdomen.  EXAM: CT ABDOMEN AND PELVIS WITHOUT CONTRAST  TECHNIQUE: Multidetector CT imaging of the abdomen and pelvis was performed following the standard protocol without IV contrast.  COMPARISON:  Lumbar spine radiographs performed 03/03/2014  FINDINGS: The visualized lung bases are clear.  The liver and spleen are unremarkable in appearance. The gallbladder is within normal limits. The pancreas and adrenal glands are unremarkable.  The kidneys are unremarkable in appearance. There is no evidence of hydronephrosis. No renal or ureteral stones are seen. No perinephric stranding is appreciated.  No free fluid is identified. The small bowel is unremarkable in appearance. The stomach is within normal limits. No acute vascular abnormalities are seen.  The appendix is imaged and unremarkable in appearance. There is no evidence for appendicitis. The colon is relatively decompressed and unremarkable in appearance.  The bladder is decompressed and not well assessed. The patient is status post hysterectomy. No suspicious adnexal masses are seen. The left ovary is unremarkable in appearance. No inguinal lymphadenopathy is seen.  No acute osseous abnormalities are identified.  IMPRESSION: Unremarkable noncontrast CT of the abdomen and pelvis.   Electronically Signed   By: Garald Balding M.D.   On: 05/02/2014 00:53      MDM   Final diagnoses:  Flank pain    Nursing notes including past medical history and social history reviewed and considered in documentation Labs/vital reviewed and  considered     Sharyon Cable, MD 05/02/14 [phone]

## 2014-08-05 ENCOUNTER — Emergency Department (HOSPITAL_COMMUNITY)
Admission: EM | Admit: 2014-08-05 | Discharge: 2014-08-05 | Disposition: A | Discharge status: Home / Self Care | Payer: Medicare HMO | Attending: Emergency Medicine | Admitting: Emergency Medicine

## 2014-08-05 ENCOUNTER — Encounter (HOSPITAL_COMMUNITY): Payer: Self-pay

## 2014-08-05 DIAGNOSIS — Z79899 Other long term (current) drug therapy: Secondary | ICD-10-CM | POA: Insufficient documentation

## 2014-08-05 DIAGNOSIS — Z8619 Personal history of other infectious and parasitic diseases: Secondary | ICD-10-CM | POA: Insufficient documentation

## 2014-08-05 DIAGNOSIS — J329 Chronic sinusitis, unspecified: Secondary | ICD-10-CM | POA: Diagnosis not present

## 2014-08-05 DIAGNOSIS — R51 Headache: Secondary | ICD-10-CM | POA: Diagnosis present

## 2014-08-05 DIAGNOSIS — E039 Hypothyroidism, unspecified: Secondary | ICD-10-CM | POA: Insufficient documentation

## 2014-08-05 DIAGNOSIS — G4730 Sleep apnea, unspecified: Secondary | ICD-10-CM | POA: Insufficient documentation

## 2014-08-05 DIAGNOSIS — J340 Abscess, furuncle and carbuncle of nose: Secondary | ICD-10-CM

## 2014-08-05 DIAGNOSIS — Z8739 Personal history of other diseases of the musculoskeletal system and connective tissue: Secondary | ICD-10-CM | POA: Insufficient documentation

## 2014-08-05 DIAGNOSIS — Z9981 Dependence on supplemental oxygen: Secondary | ICD-10-CM | POA: Diagnosis not present

## 2014-08-05 DIAGNOSIS — Z720 Tobacco use: Secondary | ICD-10-CM | POA: Insufficient documentation

## 2014-08-05 DIAGNOSIS — I10 Essential (primary) hypertension: Secondary | ICD-10-CM | POA: Insufficient documentation

## 2014-08-05 DIAGNOSIS — Z8719 Personal history of other diseases of the digestive system: Secondary | ICD-10-CM | POA: Diagnosis not present

## 2014-08-05 MED ORDER — SULFAMETHOXAZOLE-TRIMETHOPRIM 800-160 MG PO TABS: 1.0000 | ORAL_TABLET | Freq: Two times a day (BID) | ORAL | Status: DC

## 2014-08-05 MED ORDER — HYDROCODONE-ACETAMINOPHEN 5-325 MG PO TABS: 1.0000 | ORAL_TABLET | ORAL | Status: DC | PRN

## 2014-08-05 MED ORDER — MUPIROCIN CALCIUM 2 % NA OINT: TOPICAL_OINTMENT | NASAL | Status: DC

## 2014-08-05 NOTE — ED Provider Notes (Signed)
CSN: [id number]     Arrival date & time 08/05/14  1752 History  This chart was scribed for non-physician practitioner, Charlann Lange, PA-C, working with Richarda Blade, MD, by Delphia Grates, ED Scribe. This patient was seen in room TR08C/TR08C and the patient's care was started at 6:13 PM.    Chief Complaint  Patient presents with  . Facial Pain    The history is provided by the patient. No language interpreter was used.     HPI Comments: Teresa Mcgee is a 48 y.o. female who presents to the Emergency Department complaining of 6/10 right sided facial pain that began PTA. Patient states the pain radiates from the right cheek to the right side of the nose. Patient states she feels a bump and she also notes pressure behind the bilateral eyes. No aggravating or alleviating factors. She denies any other symptoms.   Past Medical History  Diagnosis Date  . Hypertension   . Hyperlipidemia   . GERD (gastroesophageal reflux disease)   . Bell's palsy may 2012  . Chest pain   . Shortness of breath   . Sleep apnea     uses cpap  . Hypothyroidism   . H/O hiatal hernia   . Shingles   . Arthritis    Past Surgical History  Procedure Laterality Date  . Thyroidectomy  1998  . Cesarian      3x  . Partial hysterectomy  2000    abnormal uterine bleeding  . Cesarean section     Family History  Problem Relation Age of Onset  . Heart attack Father 73    deceased  . Diabetes Brother   . Stroke Brother 25    twice  . Stroke Sister 58  . Diabetes Sister   . Heart attack Sister 28  . Heart attack Brother 79   History  Substance Use Topics  . Smoking status: Current Every Day Smoker -- 0.30 packs/day for 20 years    Types: Cigarettes  . Smokeless tobacco: Never Used     Comment: used to smoke 1.5 - 2 PPD  . Alcohol Use: No   OB History    No data available     Review of Systems  Constitutional: Negative for fever.  HENT: Positive for sinus pressure. Negative for dental  problem and facial swelling.        Right sided facial pain      Allergies  Review of patient's allergies indicates no known allergies.  Home Medications   Prior to Admission medications   Medication Sig Start Date End Date Taking? Authorizing Provider  enalapril-hydrochlorothiazide (VASERETIC) 10-25 MG per tablet Take 1 tablet by mouth at bedtime.    Historical Provider, MD  levothyroxine (SYNTHROID, LEVOTHROID) 200 MCG tablet Take 200 mcg by mouth daily before breakfast.    Historical Provider, MD  methocarbamol (ROBAXIN) 500 MG tablet Take 1 tablet (500 mg total) by mouth 2 (two) times daily. 03/03/14   Tatyana A Kirichenko, PA-C   Triage Vitals: BP 166/83 mmHg  Pulse 75  Temp(Src) 98.4 F (36.9 C)  Resp 18  Ht 5\' 3"  (1.6 m)  Wt 246 lb (111.585 kg)  BMI 43.59 kg/m2  SpO2 99%  Physical Exam  Constitutional: She is oriented to person, place, and time. She appears well-developed and well-nourished. No distress.  HENT:  Head: Normocephalic and atraumatic.  Right intranasal, nonseptal abscess. No facial swelling or tenderness.  Eyes: Conjunctivae and EOM are normal.  Neck: Neck supple. No  tracheal deviation present.  Cardiovascular: Normal rate.   Pulmonary/Chest: Effort normal. No respiratory distress.  Musculoskeletal: Normal range of motion.  Neurological: She is alert and oriented to person, place, and time.  Skin: Skin is warm and dry.  Psychiatric: She has a normal mood and affect. Her behavior is normal.  Nursing note and vitals reviewed.   ED Course  Procedures (including critical care time)  DIAGNOSTIC STUDIES: Oxygen Saturation is 99% on room air, normal by my interpretation.    COORDINATION OF CARE: At 1815 Discussed treatment plan with patient which includes ABX. Patient agrees.   Labs Review Labs Reviewed - No data to display  Imaging Review No results found.   EKG Interpretation None      MDM   Final diagnoses:  None    1. Nasal  abscess  Intranasal abscess - will treat with topical and oral abx. Encouraged PCP follow up for recheck.   I personally performed the services described in this documentation, which was scribed in my presence. The recorded information has been reviewed and is accurate.    Dewaine Oats, PA-C 08/06/14 0001  Richarda Blade, MD 08/06/14 [phone]

## 2014-08-05 NOTE — ED Notes (Signed)
Pt from home with right sided facial pain that starts in her cheek and radiates to her right nare. Pt sts it feels like pressure and is tender to touch.  Sts she also feels pressure behind her eyes. Pain 6/10.

## 2014-08-05 NOTE — Discharge Instructions (Signed)
Facial Infection You have an infection of your face. This requires special attention to help prevent serious problems. Infections in facial wounds can cause poor healing and scars. They can also spread to deeper tissues, especially around the eye. Wound and dental infections can lead to sinusitis, infection of the eye socket, and even meningitis. Permanent damage to the skin, eye, and nervous system may result if facial infections are not treated properly. With severe infections, hospital care for IV antibiotic injections may be needed if they don't respond to oral antibiotics. Antibiotics must be taken for the full course to insure the infection is eliminated. If the infection came from a bad tooth, it may have to be extracted when the infection is under control. Warm compresses may be applied to reduce skin irritation and remove drainage. You might need a tetanus shot now if:  You cannot remember when your last tetanus shot was.  You have never had a tetanus shot.  The object that caused your wound was dirty. If you need a tetanus shot, and you decide not to get one, there is a rare chance of getting tetanus. Sickness from tetanus can be serious. If you got a tetanus shot, your arm may swell, get red and warm to the touch at the shot site. This is common and not a problem. SEEK IMMEDIATE MEDICAL CARE IF:   You have increased swelling, redness, or trouble breathing.  You have a severe headache, dizziness, nausea, or vomiting.  You develop problems with your eyesight.  You have a fever. Document Released: 09/07/2004 Document Revised: 10/23/2011 Document Reviewed: 07/31/2005 [facility] Patient Information 2015 [place], [place]. This information is not intended to replace advice given to you by your health care provider. Make sure you discuss any questions you have with your health care provider.

## 2014-08-25 ENCOUNTER — Emergency Department (HOSPITAL_COMMUNITY): Payer: Medicare HMO

## 2014-08-25 ENCOUNTER — Emergency Department (HOSPITAL_COMMUNITY)
Admission: EM | Admit: 2014-08-25 | Discharge: 2014-08-25 | Disposition: A | Discharge status: Home / Self Care | Payer: Medicare HMO | Attending: Emergency Medicine | Admitting: Emergency Medicine

## 2014-08-25 DIAGNOSIS — R079 Chest pain, unspecified: Secondary | ICD-10-CM | POA: Insufficient documentation

## 2014-08-25 DIAGNOSIS — Z8719 Personal history of other diseases of the digestive system: Secondary | ICD-10-CM | POA: Diagnosis not present

## 2014-08-25 DIAGNOSIS — R200 Anesthesia of skin: Secondary | ICD-10-CM | POA: Diagnosis not present

## 2014-08-25 DIAGNOSIS — Z9981 Dependence on supplemental oxygen: Secondary | ICD-10-CM | POA: Insufficient documentation

## 2014-08-25 DIAGNOSIS — R202 Paresthesia of skin: Secondary | ICD-10-CM | POA: Diagnosis not present

## 2014-08-25 DIAGNOSIS — Z79899 Other long term (current) drug therapy: Secondary | ICD-10-CM | POA: Diagnosis not present

## 2014-08-25 DIAGNOSIS — Z720 Tobacco use: Secondary | ICD-10-CM | POA: Diagnosis not present

## 2014-08-25 DIAGNOSIS — Z791 Long term (current) use of non-steroidal anti-inflammatories (NSAID): Secondary | ICD-10-CM | POA: Insufficient documentation

## 2014-08-25 DIAGNOSIS — R51 Headache: Secondary | ICD-10-CM | POA: Diagnosis not present

## 2014-08-25 DIAGNOSIS — G4730 Sleep apnea, unspecified: Secondary | ICD-10-CM | POA: Diagnosis not present

## 2014-08-25 DIAGNOSIS — Z8619 Personal history of other infectious and parasitic diseases: Secondary | ICD-10-CM | POA: Insufficient documentation

## 2014-08-25 DIAGNOSIS — I517 Cardiomegaly: Secondary | ICD-10-CM | POA: Diagnosis not present

## 2014-08-25 DIAGNOSIS — R197 Diarrhea, unspecified: Secondary | ICD-10-CM

## 2014-08-25 DIAGNOSIS — M1990 Unspecified osteoarthritis, unspecified site: Secondary | ICD-10-CM | POA: Insufficient documentation

## 2014-08-25 DIAGNOSIS — E039 Hypothyroidism, unspecified: Secondary | ICD-10-CM | POA: Diagnosis not present

## 2014-08-25 DIAGNOSIS — I159 Secondary hypertension, unspecified: Secondary | ICD-10-CM | POA: Diagnosis not present

## 2014-08-25 DIAGNOSIS — R0789 Other chest pain: Secondary | ICD-10-CM | POA: Diagnosis not present

## 2014-08-25 LAB — COMPREHENSIVE METABOLIC PANEL
ALT: 23 U/L (ref 0–35)
ANION GAP: 9 (ref 5–15)
AST: 28 U/L (ref 0–37)
Albumin: 4 g/dL (ref 3.5–5.2)
Alkaline Phosphatase: 64 U/L (ref 39–117)
BUN: 5 mg/dL — AB (ref 6–23)
CO2: 25 mmol/L (ref 19–32)
CREATININE: 0.7 mg/dL (ref 0.50–1.10)
Calcium: 9.2 mg/dL (ref 8.4–10.5)
Chloride: 106 mEq/L (ref 96–112)
GFR calc non Af Amer: >90 mL/min (ref >90)
GLUCOSE: 86 mg/dL (ref 70–99)
POTASSIUM: 3.8 mmol/L (ref 3.5–5.1)
Sodium: 140 mmol/L (ref 135–145)
TOTAL PROTEIN: 7.4 g/dL (ref 6.0–8.3)
Total Bilirubin: 0.6 mg/dL (ref 0.3–1.2)

## 2014-08-25 LAB — RAPID URINE DRUG SCREEN, HOSP PERFORMED
Amphetamines: NOT DETECTED
BENZODIAZEPINES: NOT DETECTED
Barbiturates: NOT DETECTED
COCAINE: NOT DETECTED
OPIATES: NOT DETECTED
Tetrahydrocannabinol: NOT DETECTED

## 2014-08-25 LAB — URINALYSIS, ROUTINE W REFLEX MICROSCOPIC
Bilirubin Urine: NEGATIVE
GLUCOSE, UA: NEGATIVE mg/dL
KETONES UR: NEGATIVE mg/dL
LEUKOCYTES UA: NEGATIVE
Nitrite: NEGATIVE
PH: 6 (ref 5.0–8.0)
Protein, ur: NEGATIVE mg/dL
Specific Gravity, Urine: 1.023 (ref 1.005–1.030)
Urobilinogen, UA: 0.2 mg/dL (ref 0.0–1.0)

## 2014-08-25 LAB — CBC WITH DIFFERENTIAL/PLATELET
BASOS PCT: 1 % (ref 0–1)
Basophils Absolute: 0 10*3/uL (ref 0.0–0.1)
EOS PCT: 4 % (ref 0–5)
Eosinophils Absolute: 0.2 10*3/uL (ref 0.0–0.7)
HCT: 42.1 % (ref 36.0–46.0)
Hemoglobin: 14.3 g/dL (ref 12.0–15.0)
LYMPHS ABS: 3 10*3/uL (ref 0.7–4.0)
Lymphocytes Relative: 52 % — ABNORMAL HIGH (ref 12–46)
MCH: 31 pg (ref 26.0–34.0)
MCHC: 34 g/dL (ref 30.0–36.0)
MCV: 91.1 fL (ref 78.0–100.0)
MONO ABS: 0.3 10*3/uL (ref 0.1–1.0)
Monocytes Relative: 5 % (ref 3–12)
NEUTROS PCT: 38 % — AB (ref 43–77)
Neutro Abs: 2.2 10*3/uL (ref 1.7–7.7)
PLATELETS: 182 10*3/uL (ref 150–400)
RBC: 4.62 MIL/uL (ref 3.87–5.11)
RDW: 13.2 % (ref 11.5–15.5)
WBC: 5.7 10*3/uL (ref 4.0–10.5)

## 2014-08-25 LAB — URINE MICROSCOPIC-ADD ON

## 2014-08-25 LAB — I-STAT TROPONIN, ED
TROPONIN I, POC: 0.02 ng/mL (ref 0.00–0.08)
TROPONIN I, POC: 0.01 ng/mL (ref 0.00–0.08)

## 2014-08-25 NOTE — ED Notes (Signed)
Pt back from X-ray.  

## 2014-08-25 NOTE — ED Notes (Signed)
Md informed that patient has missed the urine cup twice now.

## 2014-08-25 NOTE — ED Notes (Signed)
Patient transported to X-ray 

## 2014-08-25 NOTE — ED Notes (Signed)
bp 229/132 prior to donating plasma. Also, rt. Arm numbness, and having diarrhea since am.

## 2014-08-25 NOTE — ED Provider Notes (Signed)
CSN: [id number]     Arrival date & time 08/25/14  74 History   First MD Initiated Contact with Patient 08/25/14 1521     Chief Complaint  Patient presents with  . Hypertension  . Diarrhea  . Numbness     (Consider location/radiation/quality/duration/timing/severity/associated sxs/prior Treatment) HPI Comments: Patient presents with multiple complaints including report of significant hypertension as well as central chest heaviness right arm paresthesias and diarrhea.  Chest heaviness that began about 11 AM, has been waxing and waning, though constant without radiation.  She has had no associated nausea, vomiting or shortness of breath.  The right arm paresthesias are described as tingling, lasting about a minute and have occurred for 5 times during the course of the day today.  Diarrhea started this morning and she reports 4-5 episodes of loose watery diarrhea.  She has been compliant with her clonidine which she takes at night.  Patient is a 49 y.o. female presenting with hypertension and diarrhea.  Hypertension Associated symptoms include chest pain. Pertinent negatives include no abdominal pain, no headaches and no shortness of breath.  Diarrhea Associated symptoms: no abdominal pain, no arthralgias, no chills, no diaphoresis, no fever, no headaches and no vomiting     Past Medical History  Diagnosis Date  . Hypertension   . Hyperlipidemia   . GERD (gastroesophageal reflux disease)   . Bell's palsy may 2012  . Chest pain   . Shortness of breath   . Sleep apnea     uses cpap  . Hypothyroidism   . H/O hiatal hernia   . Shingles   . Arthritis    Past Surgical History  Procedure Laterality Date  . Thyroidectomy  1998  . Cesarian      3x  . Partial hysterectomy  2000    abnormal uterine bleeding  . Cesarean section     Family History  Problem Relation Age of Onset  . Heart attack Father 71    deceased  . Diabetes Brother   . Stroke Brother 25    twice  . Stroke  Sister 36  . Diabetes Sister   . Heart attack Sister 55  . Heart attack Brother 55   History  Substance Use Topics  . Smoking status: Current Every Day Smoker -- 0.30 packs/day for 20 years    Types: Cigarettes  . Smokeless tobacco: Never Used     Comment: used to smoke 1.5 - 2 PPD  . Alcohol Use: No   OB History    No data available     Review of Systems  Constitutional: Negative for fever, chills, diaphoresis, activity change, appetite change and fatigue.  HENT: Negative for congestion, facial swelling, rhinorrhea and sore throat.   Eyes: Negative for photophobia and discharge.  Respiratory: Negative for cough, chest tightness and shortness of breath.   Cardiovascular: Positive for chest pain. Negative for palpitations and leg swelling.  Gastrointestinal: Positive for diarrhea. Negative for nausea, vomiting and abdominal pain.  Endocrine: Negative for polydipsia and polyuria.  Genitourinary: Negative for dysuria, frequency, difficulty urinating and pelvic pain.  Musculoskeletal: Negative for back pain, arthralgias, neck pain and neck stiffness.  Skin: Negative for color change and wound.  Allergic/Immunologic: Negative for immunocompromised state.  Neurological: Negative for facial asymmetry, weakness, numbness and headaches.       Right arm paresthesias  Hematological: Does not bruise/bleed easily.  Psychiatric/Behavioral: Negative for confusion and agitation.      Allergies  Review of patient's allergies indicates no known  allergies.  Home Medications   Prior to Admission medications   Medication Sig Start Date End Date Taking? Authorizing Provider  cloNIDine (CATAPRES) 0.1 MG tablet Take 0.1 mg by mouth daily.   Yes Historical Provider, MD  levothyroxine (SYNTHROID, LEVOTHROID) 200 MCG tablet Take 200 mcg by mouth daily before breakfast.   Yes Historical Provider, MD  naproxen (NAPROSYN) 500 MG tablet Take 500 mg by mouth 2 (two) times daily with a meal.   Yes  Historical Provider, MD  oxyCODONE-acetaminophen (PERCOCET) 5-325 MG per tablet Take 1 tablet by mouth every 6 (six) hours as needed for moderate pain or severe pain.   Yes Historical Provider, MD  HYDROcodone-acetaminophen (NORCO/VICODIN) 5-325 MG per tablet Take 1-2 tablets by mouth every 4 (four) hours as needed. Patient not taking: Reported on 08/25/2014 08/05/14   Nehemiah Settle A Upstill, PA-C  methocarbamol (ROBAXIN) 500 MG tablet Take 1 tablet (500 mg total) by mouth 2 (two) times daily. Patient not taking: Reported on 08/25/2014 03/03/14   Tatyana A Kirichenko, PA-C  mupirocin nasal ointment (BACTROBAN) 2 % Apply in right nostril daily Patient not taking: Reported on 08/25/2014 08/05/14   Nehemiah Settle A Upstill, PA-C  sulfamethoxazole-trimethoprim (SEPTRA DS) 800-160 MG per tablet Take 1 tablet by mouth every 12 (twelve) hours. Patient not taking: Reported on 08/25/2014 08/05/14   Nehemiah Settle A Upstill, PA-C   BP 113/96 mmHg  Pulse 48  Temp(Src) 98.4 F (36.9 C) (Oral)  Resp 12  SpO2 99% Physical Exam  Constitutional: She is oriented to person, place, and time. She appears well-developed and well-nourished. No distress.  HENT:  Head: Normocephalic and atraumatic.  Mouth/Throat: No oropharyngeal exudate.  Eyes: Pupils are equal, round, and reactive to light.  Neck: Normal range of motion. Neck supple.  Cardiovascular: Normal rate, regular rhythm and normal heart sounds.  Exam reveals no gallop and no friction rub.   No murmur heard. Pulmonary/Chest: Effort normal and breath sounds normal. No respiratory distress. She has no wheezes. She has no rales.  Abdominal: Soft. Bowel sounds are normal. She exhibits no distension and no mass. There is no tenderness. There is no rebound and no guarding.  Musculoskeletal: Normal range of motion. She exhibits no edema or tenderness.  Neurological: She is alert and oriented to person, place, and time. She has normal strength. She displays no tremor. A sensory deficit is  present. No cranial nerve deficit. She exhibits normal muscle tone. Coordination normal. GCS eye subscore is 4. GCS verbal subscore is 5. GCS motor subscore is 6.   Patient reports decreased sensation of the left upper arm.  Skin: Skin is warm and dry.  Psychiatric: She has a normal mood and affect.    ED Course  Procedures (including critical care time) Labs Review Labs Reviewed  COMPREHENSIVE METABOLIC PANEL - Abnormal; Notable for the following:    BUN 5 (*)    All other components within normal limits  CBC WITH DIFFERENTIAL - Abnormal; Notable for the following:    Neutrophils Relative % 38 (*)    Lymphocytes Relative 52 (*)    All other components within normal limits  URINALYSIS, ROUTINE W REFLEX MICROSCOPIC - Abnormal; Notable for the following:    Hgb urine dipstick MODERATE (*)    All other components within normal limits  URINE MICROSCOPIC-ADD ON - Abnormal; Notable for the following:    Bacteria, UA FEW (*)    All other components within normal limits  URINE RAPID DRUG SCREEN (HOSP PERFORMED)  I-STAT TROPOININ, ED  Randolm Idol, ED    Imaging Review Dg Chest 2 View  08/25/2014   CLINICAL DATA:  Left chest pain and right arm numbness.  EXAM: CHEST  2 VIEW  COMPARISON:  PA and lateral chest 05/18/2013.  FINDINGS: The lungs are clear. There is cardiomegaly. No pneumothorax or pleural effusion.  IMPRESSION: Cardiomegaly without acute disease.   Electronically Signed   By: Inge Rise M.D.   On: 08/25/2014 16:12   Ct Head Wo Contrast  08/25/2014   CLINICAL DATA:  Hypertension.  Headache and right arm numbness.  EXAM: CT HEAD WITHOUT CONTRAST  TECHNIQUE: Contiguous axial images were obtained from the base of the skull through the vertex without intravenous contrast.  COMPARISON:  05/18/2013  FINDINGS: The ventricles are normal in size and configuration. No extra-axial fluid collections are identified. The gray-white differentiation is normal. No CT findings for acute  intracranial process such as hemorrhage or infarction. No mass lesions. The brainstem and cerebellum are grossly normal.  The bony structures are intact. The paranasal sinuses and mastoid air cells are clear. The globes are intact.  IMPRESSION: No acute intracranial findings or mass lesions.   Electronically Signed   By: Kalman Jewels M.D.   On: 08/25/2014 16:34     EKG Interpretation   Date/Time:  Tuesday August 25 2014 14:34:14 EST Ventricular Rate:  54 PR Interval:  180 QRS Duration: 96 QT Interval:  474 QTC Calculation: 449 R Axis:   35 Text Interpretation:  Sinus bradycardia Incomplete right bundle branch  block Nonspecific T wave abnormality Abnormal ECG Rate on prior EKG nml,  though T wave abnormalities are unchanged Confirmed by DOCHERTY  MD, MEGAN  [phone]) on 08/25/2014 3:40:39 PM      MDM   Final diagnoses:  Arm paresthesia, left  Secondary hypertension, unspecified  Diarrhea    Pt is a 49 y.o. female with Pmhx as above who presents with report of severe hypertension of 229/132, while attempting to donate plasma earlier today.  Was at about 11 AM.  She also states she has had central chest heaviness which has been waxing and waning, though constant since that time.  Additionally, she has had right arm paresthesias which have been intermittent over the course of the day, lasting about a minute at a time, occurring approximately 4-5 times since this morning.  She has had 4 episodes of watery diarrhea this morning as well.  On physical exam, blood pressures 263 systolic on my exam with an appropriately sized cuff.  Cardiopulmonary exam is benign.  On neuro exam.  She reported that she had decreased sensation on her left arm (not the right arm as had been reported earlier).  Neuro exam otherwise benign.  CT head nml and given intermittent paresthesias, inconsistent symptoms on PE, nml CT head, doubt CVA.  She is no evidence of end organ dysfunction on workup, which includes a  delta troponin.  I feel she is safe to be discharged home with close outpatient follow-up with her primary doctor I've encouraged her to start a home blood pressure log.  [name] evaluation in the Emergency Department is complete. It has been determined that no acute conditions requiring further emergency intervention are present at this time. The patient/guardian have been advised of the diagnosis and plan. We have discussed signs and symptoms that warrant return to the ED, such as changes or worsening in symptoms, worsening pain, shortness of breath, focal neuro symptoms.      Ernestina Patches, MD  08/25/14 2129 

## 2014-08-25 NOTE — ED Notes (Signed)
Lab called to state that urinalysis did not have enough urine. Will attempt to recollect.

## 2014-08-25 NOTE — ED Notes (Signed)
Patient transported to CT 

## 2014-08-25 NOTE — Discharge Instructions (Signed)
Hypertension °Hypertension, commonly called high blood pressure, is when the force of blood pumping through your arteries is too strong. Your arteries are the blood vessels that carry blood from your heart throughout your body. A blood pressure reading consists of a higher number over a lower number, such as 110/72. The higher number (systolic) is the pressure inside your arteries when your heart pumps. The lower number (diastolic) is the pressure inside your arteries when your heart relaxes. Ideally you want your blood pressure below 120/80. °Hypertension forces your heart to work harder to pump blood. Your arteries may become narrow or stiff. Having hypertension puts you at risk for heart disease, stroke, and other problems.  °RISK FACTORS °Some risk factors for high blood pressure are controllable. Others are not.  °Risk factors you cannot control include:  °· Race. You may be at higher risk if you are African American. °· Age. Risk increases with age. °· Gender. Men are at higher risk than women before age 45 years. After age 65, women are at higher risk than men. °Risk factors you can control include: °· Not getting enough exercise or physical activity. °· Being overweight. °· Getting too much fat, sugar, calories, or salt in your diet. °· Drinking too much alcohol. °SIGNS AND SYMPTOMS °Hypertension does not usually cause signs or symptoms. Extremely high blood pressure (hypertensive crisis) may cause headache, anxiety, shortness of breath, and nosebleed. °DIAGNOSIS  °To check if you have hypertension, your health care provider will measure your blood pressure while you are seated, with your arm held at the level of your heart. It should be measured at least twice using the same arm. Certain conditions can cause a difference in blood pressure between your right and left arms. A blood pressure reading that is higher than normal on one occasion does not mean that you need treatment. If one blood pressure reading  is high, ask your health care provider about having it checked again. °TREATMENT  °Treating high blood pressure includes making lifestyle changes and possibly taking medicine. Living a healthy lifestyle can help lower high blood pressure. You may need to change some of your habits. °Lifestyle changes may include: °· Following the DASH diet. This diet is high in fruits, vegetables, and whole grains. It is low in salt, red meat, and added sugars. °· Getting at least 2½ hours of brisk physical activity every week. °· Losing weight if necessary. °· Not smoking. °· Limiting alcoholic beverages. °· Learning ways to reduce stress. ° If lifestyle changes are not enough to get your blood pressure under control, your health care provider may prescribe medicine. You may need to take more than one. Work closely with your health care provider to understand the risks and benefits. °HOME CARE INSTRUCTIONS °· Have your blood pressure rechecked as directed by your health care provider.   °· Take medicines only as directed by your health care provider. Follow the directions carefully. Blood pressure medicines must be taken as prescribed. The medicine does not work as well when you skip doses. Skipping doses also puts you at risk for problems.   °· Do not smoke.   °· Monitor your blood pressure at home as directed by your health care provider.  °SEEK MEDICAL CARE IF:  °· You think you are having a reaction to medicines taken. °· You have recurrent headaches or feel dizzy. °· You have swelling in your ankles. °· You have trouble with your vision. °SEEK IMMEDIATE MEDICAL CARE IF: °· You develop a severe headache or confusion. °·   You have unusual weakness, numbness, or feel faint.  You have severe chest or abdominal pain.  You vomit repeatedly.  You have trouble breathing. MAKE SURE YOU:   Understand these instructions.  Will watch your condition.  Will get help right away if you are not doing well or get worse. Document  Released: 07/31/2005 Document Revised: 12/15/2013 Document Reviewed: 05/23/2013 [employer] Patient Information 2015 [place], [place]. This information is not intended to replace advice given to you by your health care provider. Make sure you discuss any questions you have with your health care provider. Paresthesia Paresthesia is an abnormal burning or prickling sensation. This sensation is generally felt in the hands, arms, legs, or feet. However, it may occur in any part of the body. It is usually not painful. The feeling may be described as:  Tingling or numbness.  "Pins and needles."  Skin crawling.  Buzzing.  Limbs "falling asleep."  Itching. Most people experience temporary (transient) paresthesia at some time in their lives. CAUSES  Paresthesia may occur when you breathe too quickly (hyperventilation). It can also occur without any apparent cause. Commonly, paresthesia occurs when pressure is placed on a nerve. The feeling quickly goes away once the pressure is removed. For some people, however, paresthesia is a long-lasting (chronic) condition caused by an underlying disorder. The underlying disorder may be:  A traumatic, direct injury to nerves. Examples include a:  Broken (fractured) neck.  Fractured skull.  A disorder affecting the brain and spinal cord (central nervous system). Examples include:  Transverse myelitis.  Encephalitis.  Transient ischemic attack.  Multiple sclerosis.  Stroke.  Tumor or blood vessel problems, such as an arteriovenous malformation pressing against the brain or spinal cord.  A condition that damages the peripheral nerves (peripheral neuropathy). Peripheral nerves are not part of the brain and spinal cord. These conditions include:  Diabetes.  Peripheral vascular disease.  Nerve entrapment syndromes, such as carpal tunnel syndrome.  Shingles.  Hypothyroidism.  Vitamin B12 deficiencies.  Alcoholism.  Heavy metal poisoning  (lead, arsenic).  Rheumatoid arthritis.  Systemic lupus erythematosus. DIAGNOSIS  Your caregiver will attempt to find the underlying cause of your paresthesia. Your caregiver may:  Take your medical history.  Perform a physical exam.  Order various lab tests.  Order imaging tests. TREATMENT  Treatment for paresthesia depends on the underlying cause. HOME CARE INSTRUCTIONS  Avoid drinking alcohol.  You may consider massage or acupuncture to help relieve your symptoms.  Keep all follow-up appointments as directed by your caregiver. SEEK IMMEDIATE MEDICAL CARE IF:   You feel weak.  You have trouble walking or moving.  You have problems with speech or vision.  You feel confused.  You cannot control your bladder or bowel movements.  You feel numbness after an injury.  You faint.  Your burning or prickling feeling gets worse when walking.  You have pain, cramps, or dizziness.  You develop a rash. MAKE SURE YOU:  Understand these instructions.  Will watch your condition.  Will get help right away if you are not doing well or get worse. Document Released: 07/21/2002 Document Revised: 10/23/2011 Document Reviewed: 04/21/2011 [facility] Patient Information 2015 [place], [place]. This information is not intended to replace advice given to you by your health care provider. Make sure you discuss any questions you have with your health care provider.   Emergency Department Resource Guide 1) Find a Doctor and Pay Out of Pocket Although you won't have to find out who is covered by your insurance  plan, it is a good idea to ask around and get recommendations. You will then need to call the office and see if the doctor you have chosen will accept you as a new patient and what types of options they offer for patients who are self-pay. Some doctors offer discounts or will set up payment plans for their patients who do not have insurance, but you will need to ask so you aren't  surprised when you get to your appointment.  2) Contact Your Local Health Department Not all health departments have doctors that can see patients for sick visits, but many do, so it is worth a call to see if yours does. If you don't know where your local health department is, you can check in your phone book. The CDC also has a tool to help you locate your state's health department, and many state websites also have listings of all of their local health departments.  3) Find a [facility] If your illness is not likely to be very severe or complicated, you may want to try a walk in clinic. These are popping up all over the country in pharmacies, drugstores, and shopping centers. They're usually staffed by nurse practitioners or physician assistants that have been trained to treat common illnesses and complaints. They're usually fairly quick and inexpensive. However, if you have serious medical issues or chronic medical problems, these are probably not your best option.  No Primary Care Doctor: - Call Health Connect at  [phone] - they can help you locate a primary care doctor that  accepts your insurance, provides certain services, etc. - Physician Referral Service- [phone]  Chronic Pain Problems: Organization         Address  Phone   Notes  [facility]  [phone] Patients need to be referred by their primary care doctor.   Medication Assistance: Organization         Address  Phone   Notes  [facility] Medication [facility] [address] [place], [place] [address] [phone] --Must be a resident of [facility] -- Must have NO insurance coverage whatsoever (no Medicaid/ Medicare, etc.) -- The pt. MUST have a primary care doctor that directs their care regularly and follows them in the community   Med[facility]ist  [phone]   [employer]  [phone]    Agencies that provide inexpensive medical care: Organization          Address  Phone   Notes  [place]  [phone]   [facility]    [phone]   [facility] [place], [place] [address] [phone]   [place] [address], [place] [phone]   Planned Parenthood    [phone]   [facility]    [phone]   [place] and [place] [address], [place] Phone:  [phone], Fax:  [phone] Hours of Operation:  9 am - 6 pm, M-F.  Also accepts Medicaid/Medicare and self-pay.  [facility] for [place], [address],  Phone: [phone], Fax: [phone]. Hours of Operation:  8:30 am - 5:30 pm, M-F.  Also accepts Medicaid and self-pay.  [facility] [address] [place] Phone: [phone]   [place], [place], [place] [phone], Ext. [phone] Mondays & Thursdays:  7-9 AM.  First 15 patients are seen on a first come, first serve basis.    [place] Providers:  Organization         Address  Phone   Notes  [facility] [address], [place] [phone] Also accepts self-pay patients.  [facility] [address], [place]  [phone]   [place], [address], [place] [phone]   [facility] [address] [place] [phone]   Lucianne Lei [address], [place]   [phone] Only accepts [place] Access [place] patients after they have their name applied to their card.   Self-Pay (no insurance) in [facility]:  Organization         Address  Phone   Notes  Sickle Cell Patients, [facility] [place] [phone]   [facility] [place] [phone]     [facility] [place]  [place], [place], [place] [phone]   [facility]/Dr. Osei-Bonsu  [address] [place] or [address], [place] [phone] Phone number for both [place] and [place] locations is the same.  Urgent Medical and [facility] [address] [place] [phone]   [facility] [address], [place] or [address] [phone]   [facility] [address], [place] [phone], phone; [phone], fax Sees patients 1st and 3rd Saturday of every month.  Must not qualify for public or private insurance (i.e. Medicaid, Medicare, [place] Health Choice, Veterans' Benefits)  Household income should be no more than 200% of the poverty level The clinic cannot treat you if you are pregnant or think you are pregnant  Sexually transmitted diseases are not treated at the clinic.    Dental Care: Organization         Address  Phone  Notes  [facility] Department of [facility] [phone] Accepts children up to age 53 who are enrolled in [place] or [place]; pregnant women with a Medicaid card; and children who have applied for Medicaid or [place] Health Choice, but were declined, whose parents can pay a reduced fee at time of service.  [facility] Department of [facility]  [address], [place] [phone] Accepts children up to age 50 who are enrolled in [place] or [place]; pregnant women with a Medicaid card; and children who have applied for Medicaid or [place] Health Choice, but were declined, whose parents can pay a reduced fee at time of service.  [place] Adult Dental Access PROGRAM  [place] [phone] Patients are seen by appointment only. Walk-ins are not accepted. [place] will see patients 16  years of age and older. Monday - Tuesday (8am-5pm) Most Wednesdays (8:30-5pm) $30 per visit, cash only  [facility] Adult Dental Access PROGRAM  [address], [facility] [phone] Patients are seen by appointment only. Walk-ins are not accepted. [place] will see patients 57 years of age and older. One Wednesday Evening (Monthly: Volunteer Based).  $30 per visit, cash only  [place]  [phone] for adults; Children under  age 70, call Graduate Pediatric Dentistry at [phone]. Children aged 25-14, please call [phone] to request a pediatric application.  Dental services are provided in all areas of dental care including fillings, crowns and bridges, complete and partial dentures, implants, gum treatment, root canals, and extractions. Preventive care is also provided. Treatment is provided to both adults and children. Patients are selected via a lottery and there is often a waiting list.   [facility] [address], [place]  [phone] [email or web address]   [facility] [address] [place], [place] [phone], Ext. [phone] Second and Fourth Thursday of each month, opens at 6:30 AM; Clinic ends at 9 AM.  Patients are seen on a first-come first-served basis, and a limited number are seen during each clinic.   [facility]  [address] Hillard Danker [place], [place] [phone]   Eligibility Requirements You must have lived in [place], [place], or [place] counties for at least the last three months.   You cannot be eligible for state or federal sponsored [employer], including [employer], [place], or [employer].   You generally cannot be eligible for healthcare insurance through your employer.    How to apply: Eligibility screenings are held every Tuesday and Wednesday afternoon from 1:00 pm until 4:00 pm. You do not need an appointment for the interview!  [facility]  [address] [place], [place]   [place]  [place] Department  [place]  [phone]    Behavioral Health Resources in the Community: Intensive Outpatient Programs Organization         Address  Phone  Notes  [place]. [address], [place], [place] [phone]   [facility] Outpatient [address], [place], [place]   ADS: Alcohol & Drug Svcs [address], [place], [place]   [place] [address],  [place], [place] or [phone]   Substance Abuse Resources Organization         Address  Phone  Notes  Alcohol and Drug Services  [phone]   [place]  [phone]   [facility] [place]   Chinita Pester  [phone]   Residential & Outpatient Substance Abuse Program  [phone]   Psychological Services Organization         Address  Phone  Notes  [facility] [place]  [place]  [phone]   [place] [address], [place] or [phone]    Mobile Crisis Teams Organization         Address  Phone  Notes  [facility], Mobile Crisis Care Unit  [phone]   [facility]  [address] [place], [place]   [place] [address] [place] [phone]    Self-Help/Support Groups Organization         Address  Phone             Notes  [place]. of [place] - variety of support groups  [place] Call for more information  Narcotics Anonymous (NA), [facility] [address], [employer] [place]  2 meetings at this location   Brewing technologist  Notes  [facility]idential Treatment [phone] [place]  Mardene Speak [place]   [place]  [address] [place] [address]4584, [place], [place]   [place], [place] [phone] Admissions: 8am-3pm M-F  Incentives Substance [place] [address],    [place], [place] [phone]   [facility] [address] [place], [place], [place]   [facility] [address]  [place], [place]   [facility] Programs - Intensive Outpatient [address] Kristeen [address], [place], [place]   [facility] ([place].,  [place], [place] [phone] or [phone]   Residential Treatment Services (RTS) [address] [place], [place] Accepts Medicaid  [facility] [address],  [place] [phone] Substance Abuse/Addiction Treatment   [facility] Organization         Address  Phone  Notes  [facility]  [phone]   Domenic Schwab, PhD [address] [place]lis [place], [place]   [phone] or [phone]   [place], [place] [phone]   [facility] [phone] [place], [place] [phone] Insurance/Medicaid/sponsorship through [facility] and Families [address] [place]                                    [place], [place] [phone] [place] [address], [place] [phone]    Dr. Adele Schilder  [phone]   [facility] Dept. 1) [address], [place] 2) [place] 3)  [place] [address], [place] [phone]  [phone]   [place] [phone] or [phone] (After Hours)

## 2014-11-13 ENCOUNTER — Emergency Department
Admit: 2014-11-13 | Disposition: A | Discharge status: Home / Self Care | Payer: Self-pay | Admitting: Emergency Medicine

## 2015-02-18 ENCOUNTER — Emergency Department
Admission: EM | Admit: 2015-02-18 | Discharge: 2015-02-18 | Discharge status: Against Medical Advice | Payer: Commercial Managed Care - HMO | Attending: Emergency Medicine | Admitting: Emergency Medicine

## 2015-02-18 ENCOUNTER — Encounter: Payer: Self-pay | Admitting: Urgent Care

## 2015-02-18 DIAGNOSIS — Z720 Tobacco use: Secondary | ICD-10-CM | POA: Diagnosis not present

## 2015-02-18 DIAGNOSIS — I10 Essential (primary) hypertension: Secondary | ICD-10-CM | POA: Insufficient documentation

## 2015-02-18 DIAGNOSIS — R51 Headache: Secondary | ICD-10-CM | POA: Insufficient documentation

## 2015-02-18 NOTE — ED Notes (Addendum)
Patient presents with c/o headache and HTN - seen by PCP today - started on Lisinopril 40mg ; also takes Clonidine. Patient denies blurred vision, neck pain, and chest pain. Reports that she has been compliant with medications. Patient also reporting a "sore spot" to the top of her head - "it has been there for a while. I think my BP and headache are because of that thing."

## 2015-05-09 ENCOUNTER — Encounter: Payer: Self-pay | Admitting: Emergency Medicine

## 2015-05-09 ENCOUNTER — Emergency Department
Admission: EM | Admit: 2015-05-09 | Discharge: 2015-05-09 | Disposition: A | Discharge status: Home / Self Care | Payer: Commercial Managed Care - HMO | Attending: Emergency Medicine | Admitting: Emergency Medicine

## 2015-05-09 DIAGNOSIS — Z792 Long term (current) use of antibiotics: Secondary | ICD-10-CM | POA: Insufficient documentation

## 2015-05-09 DIAGNOSIS — W010XXA Fall on same level from slipping, tripping and stumbling without subsequent striking against object, initial encounter: Secondary | ICD-10-CM

## 2015-05-09 DIAGNOSIS — I10 Essential (primary) hypertension: Secondary | ICD-10-CM | POA: Insufficient documentation

## 2015-05-09 DIAGNOSIS — Y9289 Other specified places as the place of occurrence of the external cause: Secondary | ICD-10-CM | POA: Diagnosis not present

## 2015-05-09 DIAGNOSIS — S199XXA Unspecified injury of neck, initial encounter: Secondary | ICD-10-CM | POA: Diagnosis present

## 2015-05-09 DIAGNOSIS — W1839XA Other fall on same level, initial encounter: Secondary | ICD-10-CM | POA: Diagnosis not present

## 2015-05-09 DIAGNOSIS — Z79899 Other long term (current) drug therapy: Secondary | ICD-10-CM | POA: Insufficient documentation

## 2015-05-09 DIAGNOSIS — G8929 Other chronic pain: Secondary | ICD-10-CM | POA: Diagnosis not present

## 2015-05-09 DIAGNOSIS — Y998 Other external cause status: Secondary | ICD-10-CM | POA: Diagnosis not present

## 2015-05-09 DIAGNOSIS — S8002XA Contusion of left knee, initial encounter: Secondary | ICD-10-CM | POA: Diagnosis not present

## 2015-05-09 DIAGNOSIS — S161XXA Strain of muscle, fascia and tendon at neck level, initial encounter: Secondary | ICD-10-CM

## 2015-05-09 DIAGNOSIS — Y9389 Activity, other specified: Secondary | ICD-10-CM | POA: Diagnosis not present

## 2015-05-09 DIAGNOSIS — S4991XA Unspecified injury of right shoulder and upper arm, initial encounter: Secondary | ICD-10-CM | POA: Diagnosis not present

## 2015-05-09 DIAGNOSIS — Z720 Tobacco use: Secondary | ICD-10-CM | POA: Insufficient documentation

## 2015-05-09 DIAGNOSIS — Z791 Long term (current) use of non-steroidal anti-inflammatories (NSAID): Secondary | ICD-10-CM | POA: Diagnosis not present

## 2015-05-09 MED ORDER — NABUMETONE 750 MG PO TABS: 750.0000 mg | ORAL_TABLET | Freq: Two times a day (BID) | ORAL | Status: DC

## 2015-05-09 MED ORDER — ORPHENADRINE CITRATE ER 100 MG PO TB12: 100.0000 mg | ORAL_TABLET | Freq: Two times a day (BID) | ORAL | Status: DC

## 2015-05-09 NOTE — ED Provider Notes (Signed)
[facility] Emergency Department Provider Note ____________________________________________  Time seen: 2035  I have reviewed the triage vital signs and the nursing notes.  HISTORY  Chief Complaint  Shoulder Pain and Neck Pain  HPI Teresa Mcgee is a 49 y.o. female reports to the ED for evaluations of injury sustained after she took a slip and fall last night at a local store.She describes she slipped on a wet floor at the local [facility] store, and reports there was no sign to indicate a wet floor, no with the witnesses to her fall. She does at a store worker heard her moaning and grunting, and came to check on her. She advised her to stay on the floor to the store manager could be notified. She complains of a fall primarily landed on her butt, reports stiffness to the right side of the neck as well as a small contusion to the left knee. She denies any direct head injury, loss of consciousness, cuts, nausea, vomiting. She has not taken any medicine for her current acute pain. He has report been a chronic pain patient due to ongoing low back pain. She doses oxycodone regularly, but reports she is currently out of that prescription. She rates her pain at a 10/10 in triage.  Past Medical History  Diagnosis Date  . Hypertension   . Hyperlipidemia   . GERD (gastroesophageal reflux disease)   . Bell's palsy may 2012  . Chest pain   . Shortness of breath   . Sleep apnea     uses cpap  . Hypothyroidism   . H/O hiatal hernia   . Shingles   . Arthritis     Patient Active Problem List   Diagnosis Date Noted  . Osteopathic Exam Findings (Somatic Dysfunction) 04/25/2012  . Impaired glucose tolerance 02/14/2012  . Constipation 01/11/2012  . Chest pain 01/11/2012  . Obesity (BMI 35.0-39.9 without comorbidity) 12/28/2011  . Strain of lumbar paraspinous muscle 12/26/2011  . 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 Numbness and tingling in right hand 08/25/2011  . Nasal  septal perforation 05/23/2011  . Muscle spasms of neck 05/23/2011  . Nasal mucositis (ulcerative) 05/23/2011  . Fatigue 03/21/2011  . TOBACCO ABUSE 05/24/2010  . HYPOTHYROIDISM 01/25/2010  . HYPERTENSION, BENIGN ESSENTIAL 01/25/2010  . BACK PAIN, CHRONIC 01/25/2010  . HYPERCHOLESTEROLEMIA 12/31/2009  . DEPRESSION 12/31/2009  . GERD 12/31/2009  . SLEEP APNEA 12/31/2009    Past Surgical History  Procedure Laterality Date  . Thyroidectomy  1998  . Cesarian      3x  . Partial hysterectomy  2000    abnormal uterine bleeding  . Cesarean section      Current Outpatient Rx  Name  Route  Sig  Dispense  Refill  . cloNIDine (CATAPRES) 0.1 MG tablet   Oral   Take 0.1 mg by mouth daily.         Marland Kitchen HYDROcodone-acetaminophen (NORCO/VICODIN) 5-325 MG per tablet   Oral   Take 1-2 tablets by mouth every 4 (four) hours as needed. Patient not taking: Reported on 08/25/2014   12 tablet   0   . levothyroxine (SYNTHROID, LEVOTHROID) 200 MCG tablet   Oral   Take 200 mcg by mouth daily before breakfast.         . methocarbamol (ROBAXIN) 500 MG tablet   Oral   Take 1 tablet (500 mg total) by mouth 2 (two) times daily. Patient not taking: Reported on 08/25/2014   20 tablet   0   .  mupirocin nasal ointment (BACTROBAN) 2 %      Apply in right nostril daily Patient not taking: Reported on 08/25/2014   10 g   0   . nabumetone (RELAFEN) 750 MG tablet   Oral   Take 1 tablet (750 mg total) by mouth 2 (two) times daily.   30 tablet   0   . naproxen (NAPROSYN) 500 MG tablet   Oral   Take 500 mg by mouth 2 (two) times daily with a meal.         . orphenadrine (NORFLEX) 100 MG tablet   Oral   Take 1 tablet (100 mg total) by mouth 2 (two) times daily.   20 tablet   0   . oxyCODONE-acetaminophen (PERCOCET) 5-325 MG per tablet   Oral   Take 1 tablet by mouth every 6 (six) hours as needed for moderate pain or severe pain.         Marland Kitchen sulfamethoxazole-trimethoprim (SEPTRA DS)  800-160 MG per tablet   Oral   Take 1 tablet by mouth every 12 (twelve) hours. Patient not taking: Reported on 08/25/2014   10 tablet   0     Allergies Review of patient's allergies indicates no known allergies.  Family History  Problem Relation Age of Onset  . Heart attack Father 22    deceased  . Diabetes Brother   . Stroke Brother 25    twice  . Stroke Sister 41  . Diabetes Sister   . Heart attack Sister 65  . Heart attack Brother 66    Social History Social History  Substance Use Topics  . Smoking status: Current Every Day Smoker -- 0.30 packs/day for 20 years    Types: Cigarettes  . Smokeless tobacco: Never Used     Comment: used to smoke 1.5 - 2 PPD  . Alcohol Use: No   Review of Systems  Constitutional: Negative for fever. Eyes: Negative for visual changes. ENT: Negative for sore throat. Cardiovascular: Negative for chest pain. Respiratory: Negative for shortness of breath. Gastrointestinal: Negative for abdominal pain, vomiting and diarrhea. Genitourinary: Negative for dysuria. Musculoskeletal: Negative for back pain. Reports neck pain and left knee pain as above. Skin: Negative for rash. Neurological: Negative for headaches, focal weakness or numbness. ____________________________________________  PHYSICAL EXAM:  VITAL SIGNS: ED Triage Vitals  Enc Vitals Group     BP 05/09/15 1826 136/79 mmHg     Pulse Rate 05/09/15 1826 73     Resp 05/09/15 1826 2     Temp 05/09/15 1826 97.8 F (36.6 C)     Temp Source 05/09/15 1826 Oral     SpO2 05/09/15 1826 97 %     Weight 05/09/15 1826 246 lb (111.585 kg)     Height 05/09/15 1826 5\' 3"  (1.6 m)     Head Cir --      Peak Flow --      Pain Score 05/09/15 1827 10     Pain Loc --      Pain Edu? --      Excl. in [place]? --    Constitutional: Alert and oriented. Well appearing and in no distress. Eyes: Conjunctivae are normal. PERRL. Normal extraocular movements. ENT   Head: Normocephalic and  atraumatic.   Nose: No congestion/rhinorrhea.   Mouth/Throat: Mucous membranes are moist.   Neck: Supple. No thyromegaly. Hematological/Lymphatic/Immunological: No cervical lymphadenopathy. Cardiovascular: Normal rate, regular rhythm.  Respiratory: Normal respiratory effort. No wheezes/rales/rhonchi. Gastrointestinal: Soft and nontender. No distention. Musculoskeletal: Normal  spinal alignment without spasm, deformity, midline tenderness, or step-off. Normal range of motion to the cervical spine without difficulty. Patient with normal upper extremity strength with rotator cuff testing. Also noted to have normal composite fist. Nontender with normal range of motion in all other extremities.  Neurologic: Cranial nerves II through XII grossly intact. Normal upper extremity DTRs appreciated. Normal gait without ataxia. Normal speech and language. No gross focal neurologic deficits are appreciated. Skin:  Skin is warm, dry and intact. No rash noted. Psychiatric: Mood and affect are normal. Patient exhibits appropriate insight and judgment. ____________________________________________  INITIAL IMPRESSION / ASSESSMENT AND PLAN / ED COURSE  Mechanical fall due to wet floor. Patient with neck strain and knee contusion without neuromuscular deficit on exam. We'll treat with nambutone and orphenadrine, along with her prescription pain medicines. She is follow with a primary care provider or her pain management provider as needed. ____________________________________________  FINAL CLINICAL IMPRESSION(S) / ED DIAGNOSES  Final diagnoses:  Fall due to wet surface, initial encounter  Neck strain, initial encounter  Knee contusion, left, initial encounter      Melvenia Needles, PA-C 05/09/15 2144  Eula Listen, MD 05/21/15 1761

## 2015-05-09 NOTE — ED Notes (Signed)
Patient with no complaints at this time. Respirations even and unlabored. Skin warm/dry. Discharge instructions reviewed with patient at this time. Patient given opportunity to voice concerns/ask questions. Patient discharged at this time and left Emergency Department with steady gait.   

## 2015-05-09 NOTE — ED Notes (Signed)
Opened chart due to pt leaving personal item in treatment room. Needed to retrieve pt phone number contact.

## 2015-05-09 NOTE — ED Notes (Signed)
Pt presents to triage ambulatory steady gait no distress noted, reports she slid and fell at a store last night. Pt reports today right side is achy, reports right shoulder pain. Pt talks in complete sentences no respiratory distress noted.

## 2015-05-09 NOTE — Discharge Instructions (Signed)
Cervical Sprain A cervical sprain is when the tissues (ligaments) that hold the neck bones in place stretch or tear. HOME CARE   Put ice on the injured area.  Put ice in a plastic bag.  Place a towel between your skin and the bag.  Leave the ice on for 15-20 minutes, 3-4 times a day.  You may have been given a collar to wear. This collar keeps your neck from moving while you heal.  Do not take the collar off unless told by your doctor.  If you have long hair, keep it outside of the collar.  Ask your doctor before changing the position of your collar. You may need to change its position over time to make it more comfortable.  If you are allowed to take off the collar for cleaning or bathing, follow your doctor's instructions on how to do it safely.  Keep your collar clean by wiping it with mild soap and water. Dry it completely. If the collar has removable pads, remove them every 1-2 days to hand wash them with soap and water. Allow them to air dry. They should be dry before you wear them in the collar.  Do not drive while wearing the collar.  Only take medicine as told by your doctor.  Keep all doctor visits as told.  Keep all physical therapy visits as told.  Adjust your work station so that you have good posture while you work.  Avoid positions and activities that make your problems worse.  Warm up and stretch before being active. GET HELP IF:  Your pain is not controlled with medicine.  You cannot take less pain medicine over time as planned.  Your activity level does not improve as expected. GET HELP RIGHT AWAY IF:   You are bleeding.  Your stomach is upset.  You have an allergic reaction to your medicine.  You develop new problems that you cannot explain.  You lose feeling (become numb) or you cannot move any part of your body (paralysis).  You have tingling or weakness in any part of your body.  Your symptoms get worse. Symptoms include:  Pain,  soreness, stiffness, puffiness (swelling), or a burning feeling in your neck.  Pain when your neck is touched.  Shoulder or upper back pain.  Limited ability to move your neck.  Headache.  Dizziness.  Your hands or arms feel week, lose feeling, or tingle.  Muscle spasms.  Difficulty swallowing or chewing. MAKE SURE YOU:   Understand these instructions.  Will watch your condition.  Will get help right away if you are not doing well or get worse. Document Released: 01/17/2008 Document Revised: 04/02/2013 Document Reviewed: 02/05/2013 [facility] Patient Information 2015 [place], [place]. This information is not intended to replace advice given to you by your health care provider. Make sure you discuss any questions you have with your health care provider.  Fall Prevention and Home Safety Falls cause injuries and can affect all age groups. It is possible to prevent falls.  HOW TO PREVENT FALLS  Wear shoes with rubber soles that do not have an opening for your toes.  Keep the inside and outside of your house well lit.  Use night lights throughout your home.  Remove clutter from floors.  Clean up floor spills.  Remove throw rugs or fasten them to the floor with carpet tape.  Do not place electrical cords across pathways.  Put grab bars by your tub, shower, and toilet. Do not use towel bars  as grab bars.  Put handrails on both sides of the stairway. Fix loose handrails.  Do not climb on stools or stepladders, if possible.  Do not wax your floors.  Repair uneven or unsafe sidewalks, walkways, or stairs.  Keep items you use a lot within reach.  Be aware of pets.  Keep emergency numbers next to the telephone.  Put smoke detectors in your home and near bedrooms. Ask your doctor what other things you can do to prevent falls. Document Released: 05/27/2009 Document Revised: 01/30/2012 Document Reviewed: 10/31/2011 [facility] Patient Information 2015 [place], [place]. This  information is not intended to replace advice given to you by your health care provider. Make sure you discuss any questions you have with your health care provider.  Apply ice to any sore muscles. Take the prescription anti-inflammatory and muscle relaxant as directed.  Follow-up with Dr. Alyson Ingles as scheduled.

## 2016-05-11 ENCOUNTER — Emergency Department: Payer: Medicare HMO

## 2016-05-11 ENCOUNTER — Emergency Department
Admission: EM | Admit: 2016-05-11 | Discharge: 2016-05-11 | Disposition: A | Discharge status: Home / Self Care | Payer: Medicare HMO | Attending: Emergency Medicine | Admitting: Emergency Medicine

## 2016-05-11 DIAGNOSIS — I10 Essential (primary) hypertension: Secondary | ICD-10-CM | POA: Insufficient documentation

## 2016-05-11 DIAGNOSIS — F17210 Nicotine dependence, cigarettes, uncomplicated: Secondary | ICD-10-CM | POA: Diagnosis not present

## 2016-05-11 DIAGNOSIS — Z79899 Other long term (current) drug therapy: Secondary | ICD-10-CM | POA: Insufficient documentation

## 2016-05-11 DIAGNOSIS — J029 Acute pharyngitis, unspecified: Secondary | ICD-10-CM | POA: Diagnosis present

## 2016-05-11 DIAGNOSIS — R21 Rash and other nonspecific skin eruption: Secondary | ICD-10-CM | POA: Diagnosis not present

## 2016-05-11 DIAGNOSIS — E039 Hypothyroidism, unspecified: Secondary | ICD-10-CM | POA: Diagnosis not present

## 2016-05-11 LAB — POCT RAPID STREP A: STREPTOCOCCUS, GROUP A SCREEN (DIRECT): NEGATIVE

## 2016-05-11 MED ORDER — METHYLPREDNISOLONE SODIUM SUCC 125 MG IJ SOLR
80.0000 mg | Freq: Once | INTRAMUSCULAR | Status: AC
  Administered 2016-05-11: 80 mg via INTRAMUSCULAR
  Filled 2016-05-11: qty 2

## 2016-05-11 MED ORDER — FIRST-DUKES MOUTHWASH MT SUSP: 5.0000 mL | Freq: Four times a day (QID) | OROMUCOSAL | 0 refills | Status: DC | PRN

## 2016-05-11 MED ORDER — DIPHENHYDRAMINE HCL 50 MG/ML IJ SOLN
50.0000 mg | Freq: Once | INTRAMUSCULAR | Status: AC
  Administered 2016-05-11: 50 mg via INTRAMUSCULAR
  Filled 2016-05-11: qty 1

## 2016-05-11 NOTE — ED Triage Notes (Addendum)
Patient ambulatory to triage with steady gait, without difficulty or distress noted; pt reports ate chicken couple days ago and seems that it scratched her throat due subsequent pain; pt also reports generalized itchy rash with no known cause

## 2016-05-11 NOTE — Discharge Instructions (Signed)
Do not take any more enalapril or HCTZ until you talk with your primary care provider tomorrow morning.   Cool compress to itchy areas. Cool bath or shower (no hot water as will cause itching to worsen).   Cool beverages and soft foods until otherwise tolerated.

## 2016-05-11 NOTE — ED Provider Notes (Signed)
[facility] Emergency Department Provider Note  ____________________________________________  Time seen: Approximately 10:39 PM  I have reviewed the triage vital signs and the nursing notes.   HISTORY  Chief Complaint Sore Throat and Rash    HPI Teresa Mcgee is a 50 y.o. female , NAD, presents to emergency by [place] every day history of sore throat and rash. Patient states she ate a piece of chicken that seemed to get stuck and scratch her throat a few days ago. Since that time she has had throat irritation. Denies any sensation of her throat being swollen. Has not coughed up any blood. Has not had any fevers, chills or other upper respiratory symptoms. No known sick contacts. Also notes that she's had an itchy rash about her back, arms and legs over the last few days. Denies any environmental exposures or insect bites. Does note that she was recently started on enalapril and hydrochlorothiazide for her blood pressure. Her sister is at the bedside and states that she personally has had allergic rash when she was on enalapril. Again the patient denies any swelling about the lips/tongue/throat. Has not had any difficulty breathing or swallowing. Denies any chest pain, shortness of breath, wheezing, abdominal pain, nausea or vomiting.   Past Medical History:  Diagnosis Date  . Arthritis   . Bell's palsy may 2012  . Chest pain   . GERD (gastroesophageal reflux disease)   . H/O hiatal hernia   . Hyperlipidemia   . Hypertension   . Hypothyroidism   . Shingles   . Shortness of breath   . Sleep apnea    uses cpap    Patient Active Problem List   Diagnosis Date Noted  . Osteopathic Exam Findings (Somatic Dysfunction) 04/25/2012  . Impaired glucose tolerance 02/14/2012  . Constipation 01/11/2012  . Chest pain 01/11/2012  . Obesity (BMI 35.0-39.9 without comorbidity) ([place]) 12/28/2011  . Strain of lumbar paraspinous muscle 12/26/2011  . Xerotic eczema  11/28/2011  . Numbness and tingling in right hand 08/25/2011  . 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  . Muscle spasms of neck 05/23/2011  . Nasal mucositis (ulcerative) 05/23/2011  . Fatigue 03/21/2011  . TOBACCO ABUSE 05/24/2010  . HYPOTHYROIDISM 01/25/2010  . HYPERTENSION, BENIGN ESSENTIAL 01/25/2010  . BACK PAIN, CHRONIC 01/25/2010  . HYPERCHOLESTEROLEMIA 12/31/2009  . DEPRESSION 12/31/2009  . GERD 12/31/2009  . SLEEP APNEA 12/31/2009    Past Surgical History:  Procedure Laterality Date  . CESAREAN SECTION    . cesarian     3x  . PARTIAL HYSTERECTOMY  2000   abnormal uterine bleeding  . THYROIDECTOMY  1998    Prior to Admission medications   Medication Sig Start Date End Date Taking? Authorizing Provider  cloNIDine (CATAPRES) 0.1 MG tablet Take 0.1 mg by mouth daily.    Historical Provider, MD  Diphenhyd-Hydrocort-Nystatin (FIRST-DUKES MOUTHWASH) SUSP Use as directed 5 mLs in the mouth or throat 4 (four) times daily as needed (for throat pain). 05/11/16   Jami L Hagler, PA-C  HYDROcodone-acetaminophen (NORCO/VICODIN) 5-325 MG per tablet Take 1-2 tablets by mouth every 4 (four) hours as needed. Patient not taking: Reported on 08/25/2014 08/05/14   Charlann Lange, PA-C  levothyroxine (SYNTHROID, LEVOTHROID) 200 MCG tablet Take 200 mcg by mouth daily before breakfast.    Historical Provider, MD  methocarbamol (ROBAXIN) 500 MG tablet Take 1 tablet (500 mg total) by mouth 2 (two) times daily. Patient not taking: Reported on 08/25/2014 03/03/14   Jeannett Senior, PA-C  mupirocin nasal  ointment (BACTROBAN) 2 % Apply in right nostril daily Patient not taking: Reported on 08/25/2014 08/05/14   Charlann Lange, PA-C  nabumetone (RELAFEN) 750 MG tablet Take 1 tablet (750 mg total) by mouth 2 (two) times daily. 05/09/15   Jenise V Bacon Menshew, PA-C  naproxen (NAPROSYN) 500 MG tablet Take 500 mg by mouth 2 (two) times daily with a meal.    Historical Provider, MD  orphenadrine (NORFLEX) 100  MG tablet Take 1 tablet (100 mg total) by mouth 2 (two) times daily. 05/09/15   Jenise V Bacon Menshew, PA-C  oxyCODONE-acetaminophen (PERCOCET) 5-325 MG per tablet Take 1 tablet by mouth every 6 (six) hours as needed for moderate pain or severe pain.    Historical Provider, MD  sulfamethoxazole-trimethoprim (SEPTRA DS) 800-160 MG per tablet Take 1 tablet by mouth every 12 (twelve) hours. Patient not taking: Reported on 08/25/2014 08/05/14   Charlann Lange, PA-C    Allergies Review of patient's allergies indicates no known allergies.  Family History  Problem Relation Age of Onset  . Heart attack Father 42    deceased  . Diabetes Brother   . Stroke Brother 25    twice  . Stroke Sister 42  . Diabetes Sister   . Heart attack Sister 36  . Heart attack Brother 40    Social History Social History  Substance Use Topics  . Smoking status: Current Every Day Smoker    Packs/day: 0.30    Years: 20.00    Types: Cigarettes  . Smokeless tobacco: Never Used     Comment: used to smoke 1.5 - 2 PPD  . Alcohol use No     Review of Systems  Constitutional: No fever/chills ENT: Positive sore throat. No nasal congestion, runny nose, sinus pressure Cardiovascular: No chest pain. Respiratory: No cough. No shortness of breath. No wheezing.  Gastrointestinal: No abdominal pain.  No nausea, vomiting.   Musculoskeletal: Negative for general myalgias or joint swelling.  Skin: Positive for rash. Neurological: Negative for headaches. 10-point ROS otherwise negative.  ____________________________________________   PHYSICAL EXAM:  VITAL SIGNS: ED Triage Vitals  Enc Vitals Group     BP 05/11/16 2146 (!) 146/90     Pulse Rate 05/11/16 2146 78     Resp 05/11/16 2146 18     Temp 05/11/16 2146 98 F (36.7 C)     Temp Source 05/11/16 2146 Oral     SpO2 05/11/16 2146 99 %     Weight 05/11/16 2145 246 lb (111.6 kg)     Height 05/11/16 2145 5\' 3"  (1.6 m)     Head Circumference --      Peak Flow --       Pain Score 05/11/16 2145 8     Pain Loc --      Pain Edu? --      Excl. in [place]? --      Constitutional: Alert and oriented. Well appearing and in no acute distress.Talking in complete sentences without difficulty. Eyes: Conjunctivae are normal.  Head: Atraumatic. ENT:      Nose: No congestion/rhinnorhea.      Mouth/Throat: Mucous membranes are moist. Pharynx without erythema, swelling, exudate. Uvula is midline. Airway is patent. No swelling about the lips/tongue/throat. Neck: No stridor, carotid bruits. Supple with full range of motion. Hematological/Lymphatic/Immunilogical: No cervical lymphadenopathy. Cardiovascular: Normal rate, regular rhythm. Normal S1 and S2.  Good peripheral circulation. Respiratory: Normal respiratory effort without tachypnea or retractions. Lungs CTAB with breath sounds noted in all lung fields.  No wheeze, rhonchi, rales Musculoskeletal: No lower extremity tenderness nor edema.  No joint effusions. FROM of bilateral upper and lower extremities without pain or difficulty Neurologic:  Normal speech and language. No gross focal neurologic deficits are appreciated.  Skin:  Erythematous maculopapular rash is noted about the bilateral upper and lower extremities, as well as diffusely across the back. Excoriations are noted from the patient's scratching and also dry skin. No open skin sores, oozing or weeping. No pain to palpation. Patient notes exquisite itching to all areas. Skin is warm, dry and intact.  Psychiatric: Mood and affect are normal. Speech and behavior are normal. Patient exhibits appropriate insight and judgement.   ____________________________________________   LABS (all labs ordered are listed, but only abnormal results are displayed)  Labs Reviewed  POCT RAPID STREP A   ____________________________________________  EKG  None ____________________________________________  RADIOLOGY I have personally viewed and evaluated these images  (plain radiographs) as part of my medical decision making, as well as reviewing the written report by the radiologist.  Dg Neck Soft Tissue  Result Date: 05/11/2016 CLINICAL DATA:  Acute onset of throat pain after eating chicken. Initial encounter. EXAM: NECK SOFT TISSUES - 1+ VIEW COMPARISON:  Bold FINDINGS: There is no evidence of retropharyngeal soft tissue swelling or epiglottic enlargement. The cervical airway is unremarkable and no radio-opaque foreign body is identified. The prevertebral soft tissues are within normal limits. The nasopharynx, oropharynx and hypopharynx are unremarkable. The cervical spine is unremarkable in appearance. The visualized paranasal sinuses and mastoid air cells are well-aerated. The visualized lung apices are grossly clear. IMPRESSION: Unremarkable radiographs of the soft tissues of the neck. Electronically Signed   By: Garald Balding M.D.   On: 05/11/2016 22:43    ____________________________________________    PROCEDURES  Procedure(s) performed: None   Procedures   Medications  diphenhydrAMINE (BENADRYL) injection 50 mg (50 mg Intramuscular Given 05/11/16 2246)  methylPREDNISolone sodium succinate (SOLU-MEDROL) 125 mg/2 mL injection 80 mg (80 mg Intramuscular Given 05/11/16 2247)     ____________________________________________   INITIAL IMPRESSION / ASSESSMENT AND PLAN / ED COURSE  Pertinent labs & imaging results that were available during my care of the patient were reviewed by me and considered in my medical decision making (see chart for details).  Clinical Course    Patient's diagnosis is consistent with Sore throat and rash. Patient was given Benadryl and Solu-Medrol while in the emergency department and tolerated well without side effects. Patient is advised to discontinue taking enalapril and hydrochlorothiazide and contact her primary care provider in the morning for further instructions on medications and changes. Patient will be  discharged home with prescriptions for first [facility] mouthwash to use as needed. Patient is to follow up with primary care provider if symptoms persist past this treatment course. Patient is given ED precautions to return to the ED for any worsening or new symptoms.    ____________________________________________  FINAL CLINICAL IMPRESSION(S) / ED DIAGNOSES  Final diagnoses:  Sore throat  Rash      NEW MEDICATIONS STARTED DURING THIS VISIT:  Discharge Medication List as of 05/11/2016 10:56 PM    START taking these medications   Details  Diphenhyd-Hydrocort-Nystatin (FIRST-DUKES MOUTHWASH) SUSP Use as directed 5 mLs in the mouth or throat 4 (four) times daily as needed (for throat pain)., Starting Thu 05/11/2016, [place], PA-C 05/11/16 2316    Earleen Newport, MD 05/11/16 [phone]

## 2016-06-28 ENCOUNTER — Emergency Department
Admission: EM | Admit: 2016-06-28 | Discharge: 2016-06-28 | Disposition: A | Discharge status: Home / Self Care | Payer: Medicare HMO | Attending: Emergency Medicine | Admitting: Emergency Medicine

## 2016-06-28 ENCOUNTER — Emergency Department: Payer: Medicare HMO

## 2016-06-28 ENCOUNTER — Encounter: Payer: Self-pay | Admitting: Emergency Medicine

## 2016-06-28 DIAGNOSIS — Z79899 Other long term (current) drug therapy: Secondary | ICD-10-CM | POA: Insufficient documentation

## 2016-06-28 DIAGNOSIS — R0602 Shortness of breath: Secondary | ICD-10-CM | POA: Diagnosis present

## 2016-06-28 DIAGNOSIS — J069 Acute upper respiratory infection, unspecified: Secondary | ICD-10-CM

## 2016-06-28 DIAGNOSIS — I10 Essential (primary) hypertension: Secondary | ICD-10-CM | POA: Insufficient documentation

## 2016-06-28 DIAGNOSIS — J040 Acute laryngitis: Secondary | ICD-10-CM | POA: Diagnosis not present

## 2016-06-28 DIAGNOSIS — H1032 Unspecified acute conjunctivitis, left eye: Secondary | ICD-10-CM | POA: Diagnosis not present

## 2016-06-28 DIAGNOSIS — F17210 Nicotine dependence, cigarettes, uncomplicated: Secondary | ICD-10-CM | POA: Diagnosis not present

## 2016-06-28 DIAGNOSIS — E039 Hypothyroidism, unspecified: Secondary | ICD-10-CM | POA: Diagnosis not present

## 2016-06-28 LAB — POCT RAPID STREP A: Streptococcus, Group A Screen (Direct): NEGATIVE

## 2016-06-28 MED ORDER — ERYTHROMYCIN 5 MG/GM OP OINT
TOPICAL_OINTMENT | Freq: Once | OPHTHALMIC | Status: AC
  Administered 2016-06-28: 1 via OPHTHALMIC
  Filled 2016-06-28: qty 1

## 2016-06-28 MED ORDER — POLYMYXIN B-TRIMETHOPRIM 10000-0.1 UNIT/ML-% OP SOLN: 2.0000 [drp] | Freq: Four times a day (QID) | OPHTHALMIC | 0 refills | Status: AC

## 2016-06-28 MED ORDER — ETODOLAC 200 MG PO CAPS: 200.0000 mg | ORAL_CAPSULE | Freq: Three times a day (TID) | ORAL | 0 refills | Status: DC

## 2016-06-28 MED ORDER — KETOROLAC TROMETHAMINE 60 MG/2ML IM SOLN
60.0000 mg | Freq: Once | INTRAMUSCULAR | Status: AC
  Administered 2016-06-28: 60 mg via INTRAMUSCULAR
  Filled 2016-06-28: qty 2

## 2016-06-28 MED ORDER — HYDROCOD POLST-CPM POLST ER 10-8 MG/5ML PO SUER
5.0000 mL | Freq: Once | ORAL | Status: AC
Start: 2016-06-28 — End: 2016-06-28
  Administered 2016-06-28: 5 mL via ORAL
  Filled 2016-06-28: qty 5

## 2016-06-28 MED ORDER — HYDROCOD POLST-CPM POLST ER 10-8 MG/5ML PO SUER: 5.0000 mL | Freq: Two times a day (BID) | ORAL | 0 refills | Status: DC

## 2016-06-28 NOTE — ED Triage Notes (Addendum)
Patient ambulatory to triage with steady gait, without difficulty or distress noted, mask in place, pt reports eye redness, congestion, cough x 3 days

## 2016-06-28 NOTE — ED Provider Notes (Signed)
[facility] Emergency Department Provider Note   ____________________________________________   First MD Initiated Contact with Patient 06/28/16 [phone]     (approximate)  I have reviewed the triage vital signs and the nursing notes.   HISTORY  Chief Complaint Conjunctivitis and Nasal Congestion    HPI Teresa Mcgee is a 50 y.o. female whocomes into the hospital today feeling unwell. The patient reports that she feels horrible. The patient reports that her throat is sore, her voice is gone, her eyes are red, she has body aches and she has no energy. The patient has had these symptoms for 3 days. She has been taking Tylenol as well as Alka-Seltzer plus but it did not help her symptoms. She reports that a week ago she went to a friend's house who had pneumonia. She has not taken her temperature but has been feeling hot. She is woken up and sweats as well. The patient reports that she has some shortness of breath when she lays flat but denies any chest pain. She reports that it does burn in her throat when she coughs. The patient decided to come into the hospital today for evaluation. The patient rates her pain a 9/10   Past Medical History:  Diagnosis Date  . Arthritis   . Bell's palsy may 2012  . Chest pain   . GERD (gastroesophageal reflux disease)   . H/O hiatal hernia   . Hyperlipidemia   . Hypertension   . Hypothyroidism   . Shingles   . Shortness of breath   . Sleep apnea    uses cpap    Patient Active Problem List   Diagnosis Date Noted  . Osteopathic Exam Findings (Somatic Dysfunction) 04/25/2012  . Impaired glucose tolerance 02/14/2012  . Constipation 01/11/2012  . Chest pain 01/11/2012  . Obesity (BMI 35.0-39.9 without comorbidity) 12/28/2011  . Strain of lumbar paraspinous muscle 12/26/2011  . 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 Numbness and tingling in right hand 08/25/2011  . 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  . Muscle spasms of neck  05/23/2011  . Nasal mucositis (ulcerative) 05/23/2011  . Fatigue 03/21/2011  . TOBACCO ABUSE 05/24/2010  . HYPOTHYROIDISM 01/25/2010  . HYPERTENSION, BENIGN ESSENTIAL 01/25/2010  . BACK PAIN, CHRONIC 01/25/2010  . HYPERCHOLESTEROLEMIA 12/31/2009  . DEPRESSION 12/31/2009  . GERD 12/31/2009  . SLEEP APNEA 12/31/2009    Past Surgical History:  Procedure Laterality Date  . CESAREAN SECTION    . cesarian     3x  . PARTIAL HYSTERECTOMY  2000   abnormal uterine bleeding  . THYROIDECTOMY  1998    Prior to Admission medications   Medication Sig Start Date End Date Taking? Authorizing Provider  chlorpheniramine-HYDROcodone (TUSSIONEX PENNKINETIC ER) 10-8 MG/5ML SUER Take 5 mLs by mouth 2 (two) times daily. 06/28/16   Loney Hering, MD  cloNIDine (CATAPRES) 0.1 MG tablet Take 0.1 mg by mouth daily.    Historical Provider, MD  Diphenhyd-Hydrocort-Nystatin (FIRST-DUKES MOUTHWASH) SUSP Use as directed 5 mLs in the mouth or throat 4 (four) times daily as needed (for throat pain). 05/11/16   Jami L Hagler, PA-C  etodolac (LODINE) 200 MG capsule Take 1 capsule (200 mg total) by mouth every 8 (eight) hours. 06/28/16   Loney Hering, MD  HYDROcodone-acetaminophen (NORCO/VICODIN) 5-325 MG per tablet Take 1-2 tablets by mouth every 4 (four) hours as needed. Patient not taking: Reported on 08/25/2014 08/05/14   Charlann Lange, PA-C  levothyroxine (SYNTHROID, LEVOTHROID) 200 MCG tablet Take 200 mcg by  mouth daily before breakfast.    Historical Provider, MD  methocarbamol (ROBAXIN) 500 MG tablet Take 1 tablet (500 mg total) by mouth 2 (two) times daily. Patient not taking: Reported on 08/25/2014 03/03/14   Jeannett Senior, PA-C  mupirocin nasal ointment (BACTROBAN) 2 % Apply in right nostril daily Patient not taking: Reported on 08/25/2014 08/05/14   Charlann Lange, PA-C  nabumetone (RELAFEN) 750 MG tablet Take 1 tablet (750 mg total) by mouth 2 (two) times daily. 05/09/15   Jenise V Bacon Menshew,  PA-C  naproxen (NAPROSYN) 500 MG tablet Take 500 mg by mouth 2 (two) times daily with a meal.    Historical Provider, MD  orphenadrine (NORFLEX) 100 MG tablet Take 1 tablet (100 mg total) by mouth 2 (two) times daily. 05/09/15   Jenise V Bacon Menshew, PA-C  oxyCODONE-acetaminophen (PERCOCET) 5-325 MG per tablet Take 1 tablet by mouth every 6 (six) hours as needed for moderate pain or severe pain.    Historical Provider, MD  sulfamethoxazole-trimethoprim (SEPTRA DS) 800-160 MG per tablet Take 1 tablet by mouth every 12 (twelve) hours. Patient not taking: Reported on 08/25/2014 08/05/14   Charlann Lange, PA-C  trimethoprim-polymyxin b (POLYTRIM) ophthalmic solution Place 2 drops into both eyes every 6 (six) hours. 06/28/16 07/05/16  Loney Hering, MD    Allergies Patient has no known allergies.  Family History  Problem Relation Age of Onset  . Heart attack Father 68    deceased  . Diabetes Brother   . Stroke Brother 25    twice  . Stroke Sister 42  . Diabetes Sister   . Heart attack Sister 69  . Heart attack Brother 97    Social History Social History  Substance Use Topics  . Smoking status: Current Every Day Smoker    Packs/day: 0.30    Years: 20.00    Types: Cigarettes  . Smokeless tobacco: Never Used     Comment: used to smoke 1.5 - 2 PPD  . Alcohol use No    Review of Systems Constitutional: feverish Eyes: Eye redness and drainage ENT:  sore throat. Cardiovascular: Denies chest pain. Respiratory: cough Gastrointestinal: No abdominal pain.  No nausea, no vomiting.  No diarrhea.  No constipation. Genitourinary: Negative for dysuria. Musculoskeletal: Muscle aches Skin: Negative for rash. Neurological: Negative for headaches, focal weakness or numbness.  10-point ROS otherwise negative.  ____________________________________________   PHYSICAL EXAM:  VITAL SIGNS: ED Triage Vitals  Enc Vitals Group     BP 06/28/16 0118 110/70     Pulse Rate 06/28/16 0118 80       Resp 06/28/16 0118 18     Temp 06/28/16 0118 98.4 F (36.9 C)     Temp Source 06/28/16 0118 Oral     SpO2 06/28/16 0118 97 %     Weight 06/28/16 0116 246 lb (111.6 kg)     Height 06/28/16 0116 5\' 3"  (1.6 m)     Head Circumference --      Peak Flow --      Pain Score 06/28/16 0116 9     Pain Loc --      Pain Edu? --      Excl. in [place]? --     Constitutional: Alert and oriented. Well appearing and in Moderate distress. Eyes: Conjunctivae And sclera are injected with some purulent drainage from the right PERRL. EOMI. Head: Atraumatic. Nose: No congestion/rhinnorhea. Mouth/Throat: Mucous membranes are moist.  Oropharynx Mildly erythematous. Cardiovascular: Normal rate, regular rhythm. Grossly normal heart  sounds.  Good peripheral circulation. Respiratory: Normal respiratory effort.  No retractions. Lungs CTAB. Gastrointestinal: Soft and nontender. No distention. Positive bowel sounds Musculoskeletal: No lower extremity tenderness nor edema.   Neurologic:  Normal speech and language.  Skin:  Skin is warm, dry and intact.  Psychiatric: Mood and affect are normal.   ____________________________________________   LABS (all labs ordered are listed, but only abnormal results are displayed)  Labs Reviewed  POCT RAPID STREP A   ____________________________________________  EKG  None ____________________________________________  RADIOLOGY  Chest x-ray ____________________________________________   PROCEDURES  Procedure(s) performed: None  Procedures  Critical Care performed: No  ____________________________________________   INITIAL IMPRESSION / ASSESSMENT AND PLAN / ED COURSE  Pertinent labs & imaging results that were available during my care of the patient were reviewed by me and considered in my medical decision making (see chart for details).  This is a 50 year old female who comes into the hospital today not feeling well. She has some muscle aches as well  as some cough and sore throat. The patient does have some conjunctival injection. I will give the patient a dose of Tussionex, a shot of Toradol and some erythromycin ointment to her eyes. I will reassess the patient once I received the results of her x-ray.  Clinical Course as of Jun 28 329  Wed Jun 28, 2016  0228 No active cardiopulmonary disease DG Chest 2 View [AW]    Clinical Course User Index [AW] Loney Hering, MD   The patient reports that she does feel some improvement although she has not fully better. I did inform her that with a viral illness she will feel unwell for a few days 7-10 and then she should feel better. I also informed her that her cough may linger. I will give the patient some medication for home and have her follow-up with her primary care physician. The patient has no further complaints or concerns and she'll be discharged home.  ____________________________________________   FINAL CLINICAL IMPRESSION(S) / ED DIAGNOSES  Final diagnoses:  Viral upper respiratory tract infection  Laryngitis  Acute conjunctivitis of left eye, unspecified acute conjunctivitis type      NEW MEDICATIONS STARTED DURING THIS VISIT:  Discharge Medication List as of 06/28/2016  3:19 AM    START taking these medications   Details  chlorpheniramine-HYDROcodone (TUSSIONEX PENNKINETIC ER) 10-8 MG/5ML SUER Take 5 mLs by mouth 2 (two) times daily., Starting Wed 06/28/2016, Print    etodolac (LODINE) 200 MG capsule Take 1 capsule (200 mg total) by mouth every 8 (eight) hours., Starting Wed 06/28/2016, Print    trimethoprim-polymyxin b (POLYTRIM) ophthalmic solution Place 2 drops into both eyes every 6 (six) hours., Starting Wed 06/28/2016, Until Wed 07/05/2016, Print         Note:  This document was prepared using Dragon voice recognition software and may include unintentional dictation errors.    Loney Hering, MD 06/28/16 0330

## 2017-11-16 ENCOUNTER — Encounter: Payer: Self-pay | Admitting: Family Medicine

## 2017-11-16 ENCOUNTER — Ambulatory Visit (INDEPENDENT_AMBULATORY_CARE_PROVIDER_SITE_OTHER): Payer: Medicare HMO | Admitting: Family Medicine

## 2017-11-16 VITALS — BP 118/82 | HR 80 | Temp 98.0°F | Ht 62.25 in | Wt 252.8 lb

## 2017-11-16 DIAGNOSIS — M5441 Lumbago with sciatica, right side: Secondary | ICD-10-CM

## 2017-11-16 DIAGNOSIS — I10 Essential (primary) hypertension: Secondary | ICD-10-CM

## 2017-11-16 DIAGNOSIS — M5442 Lumbago with sciatica, left side: Secondary | ICD-10-CM

## 2017-11-16 DIAGNOSIS — G4733 Obstructive sleep apnea (adult) (pediatric): Secondary | ICD-10-CM

## 2017-11-16 DIAGNOSIS — Z7689 Persons encountering health services in other specified circumstances: Secondary | ICD-10-CM | POA: Diagnosis not present

## 2017-11-16 DIAGNOSIS — Z720 Tobacco use: Secondary | ICD-10-CM

## 2017-11-16 DIAGNOSIS — E039 Hypothyroidism, unspecified: Secondary | ICD-10-CM | POA: Diagnosis not present

## 2017-11-16 DIAGNOSIS — G8929 Other chronic pain: Secondary | ICD-10-CM | POA: Diagnosis not present

## 2017-11-16 DIAGNOSIS — J302 Other seasonal allergic rhinitis: Secondary | ICD-10-CM

## 2017-11-16 DIAGNOSIS — E669 Obesity, unspecified: Secondary | ICD-10-CM | POA: Diagnosis not present

## 2017-11-16 MED ORDER — AMLODIPINE BESYLATE 10 MG PO TABS: 10.0000 mg | ORAL_TABLET | Freq: Every day | ORAL | 3 refills | Status: DC

## 2017-11-16 MED ORDER — CETIRIZINE HCL 10 MG PO TABS: 10.0000 mg | ORAL_TABLET | Freq: Every day | ORAL | 3 refills | Status: DC

## 2017-11-16 MED ORDER — KETOTIFEN FUMARATE 0.025 % OP SOLN: 1.0000 [drp] | Freq: Two times a day (BID) | OPHTHALMIC | 0 refills | Status: DC

## 2017-11-16 MED ORDER — HYDROCHLOROTHIAZIDE 25 MG PO TABS
25.0000 mg | ORAL_TABLET | Freq: Every day | ORAL | 3 refills | Status: DC
Start: 2017-11-16 — End: 2018-11-20

## 2017-11-16 MED ORDER — CARBOXYMETHYLCELLULOSE SODIUM 1 % OP SOLN: 1.0000 [drp] | Freq: Three times a day (TID) | OPHTHALMIC | 12 refills | Status: DC

## 2017-11-16 NOTE — Progress Notes (Signed)
Subjective:    Patient ID: Teresa Mcgee, female    DOB: [birth year], 52 y.o.   MRN: [id number]  HPI Teresa Mcgee is a 52 y.o. female who presents today to establish care with Teresa Netters, FNP. Was recently being followed by Dr. Alyson Mcgee but has recently closed. Last complete physical was completed 2 years ago.  1) Health Maintenance - Pap Smear: Due, has had a partial hysterectomy - Mammogram: 2006 - Colonoscopy: Last 2008 at [facility]: - Td: Due 2022 - Flu: hasn't in years  - Eye Exam: October 2018 - Dentist: edentulous with denture  Home: Lives with son (45yo) who has special needs, seperated from husband Employment: Disabled s/t Chronic back pain Activities: Spends time with sisters Depression: PHQ2 of 0 Sleep: Poor s/t back pain  Depression screen [facility] 2/9 12/26/2011  Decreased Interest 0  Down, Depressed, Hopeless 0  PHQ - 2 Score 0   2) HTN- On amlodipine, clonidine, HCTZ,   3) HLD- Previously on Atorvaststin but not anymore  4) OSA- CPAP broke and talked to [facility] and told she just needs a current prescription. Last sleep study completed in 2010 at [facility] [place]  5) Tobacco Abuse- 1 pack per week but is actively working on it  6) Hypothyroidism- On Synthroid, reports taking it first thing in morning with water 30-68min before meal  7) Chronic Back Pain- has been worse lately and causing sleep disturbance. Takes Percocet for relief and takes ~3/week and Naproxen 500mg  TID when she doesn't take percocet. Was being seen at pain clinic but no longer. Received steroid injection there which caused weight gain. Does report some tingling and numbness in legs. Denies recent falls but has fallen in past from her knee giving out.   8) Seasonal Allergies- was taking Ceterizine but hasn't recently. Also reports taking flonase but doesn't like taking it regularly  Review of Systems  Eyes: Positive for  redness and itching.  Respiratory: Negative for chest tightness and shortness of breath.   Cardiovascular: Negative for chest pain.  Gastrointestinal: Negative for abdominal pain, constipation, nausea and vomiting.       Intermittent Indigestion  Musculoskeletal: Negative for joint swelling.      Past Medical History:  Diagnosis Date  . Arthritis   . Bell's palsy may 2012  . Chest pain   . GERD (gastroesophageal reflux disease)   . H/O hiatal hernia   . Hyperlipidemia   . Hypertension   . Hypothyroidism   . Shingles   . Shortness of breath   . Sleep apnea    uses cpap   Past Surgical History:  Procedure Laterality Date  . CESAREAN SECTION    . cesarian     3x  . PARTIAL HYSTERECTOMY  2000   abnormal uterine bleeding  . THYROIDECTOMY  1998   Social History   Socioeconomic History  . Marital status: Married    Spouse name: Not on file  . Number of children: Not on file  . Years of education: Not on file  . Highest education level: Not on file  Occupational History  . Occupation: disabled    Fish farm manager: DISABILITY    Comment: back  Social Needs  . Financial resource strain: Not on file  . Food insecurity:    Worry: Not on file    Inability: Not on file  . Transportation needs:    Medical: Not on file    Non-medical: Not on  file  Tobacco Use  . Smoking status: Current Every Day Smoker    Packs/day: 0.30    Years: 20.00    Pack years: 6.00    Types: Cigarettes  . Smokeless tobacco: Never Used  . Tobacco comment: used to smoke 1.5 - 2 PPD  Substance and Sexual Activity  . Alcohol use: No  . Drug use: No  . Sexual activity: Yes  Lifestyle  . Physical activity:    Days per week: Not on file    Minutes per session: Not on file  . Stress: Not on file  Relationships  . Social connections:    Talks on phone: Not on file    Gets together: Not on file    Attends religious service: Not on file    Active member of club or organization: Not on file    Attends  meetings of clubs or organizations: Not on file    Relationship status: Not on file  . Intimate partner violence:    Fear of current or ex partner: Not on file    Emotionally abused: Not on file    Physically abused: Not on file    Forced sexual activity: Not on file  Other Topics Concern  . Not on file  Social History Narrative  . Not on file   Family History  Problem Relation Age of Onset  . Heart attack Father 13       deceased  . Diabetes Brother   . Stroke Brother 25       twice  . Stroke Sister 33  . Diabetes Sister   . Heart attack Sister 9  . Heart attack Brother 61   Current Outpatient Medications on File Prior to Visit  Medication Sig Dispense Refill  . amLODipine (NORVASC) 10 MG tablet Take 1 tablet by mouth daily.  10  . cetirizine (ZYRTEC) 10 MG tablet Take 10 mg by mouth daily.  2  . cloNIDine (CATAPRES) 0.1 MG tablet Take 0.1 mg by mouth daily.    . hydrochlorothiazide (HYDRODIURIL) 25 MG tablet Take 25 mg by mouth daily. as directed  5  . levothyroxine (SYNTHROID, LEVOTHROID) 200 MCG tablet Take 200 mcg by mouth daily before breakfast.    . naproxen (NAPROSYN) 500 MG tablet Take 500 mg by mouth 2 (two) times daily with a meal.    . oxyCODONE-acetaminophen (PERCOCET) 5-325 MG per tablet Take 1 tablet by mouth every 6 (six) hours as needed for moderate pain or severe pain.     No current facility-administered medications on file prior to visit.     Objective:   Physical Exam  BP 118/82   Pulse 80   Temp 98 F (36.7 C) (Oral)   Ht 5' 2.25" (1.581 m)   Wt 252 lb 12 oz (114.6 kg)   SpO2 97%   BMI 45.86 kg/m   BP Readings from Last 3 Encounters:  06/28/16 122/68  05/11/16 136/79  05/09/15 134/80   Wt Readings from Last 3 Encounters:  11/16/17 252 lb 12 oz (114.6 kg)  06/28/16 246 lb (111.6 kg)  05/11/16 246 lb (111.6 kg)   Physical exam completed by Teresa Netters, FNP    Assessment & Plan:   1. HYPERTENSION, BENIGN ESSENTIAL - Well  Controlled - amLODipine (NORVASC) 10 MG tablet; Take 1 tablet (10 mg total) by mouth daily.  Dispense: 90 tablet; Refill: 3 - hydrochlorothiazide (HYDRODIURIL) 25 MG tablet; Take 1 tablet (25 mg total) by mouth daily. as directed  Dispense:  90 tablet; Refill: 3 - Comprehensive metabolic panel - CBC with Differential  2. Obesity (BMI 35.0-39.9 without comorbidity) - Comprehensive metabolic panel - CBC with Differential - Lipid Panel - Hemoglobin A1c  3. Chronic bilateral low back pain with bilateral sciatica - Requesting records from previous PCP  4. Obstructive sleep apnea syndrome - Will obtain records from [facility] in [place] and order sleep study or CPAP as needed  5. Acquired hypothyroidism - TSH  6. Encounter to establish care - Plan for CPE with Teresa Netters, FNP, in 2-4 weeks  7. Tobacco abuse - Smoking Cessation education given  8. Seasonal allergic rhinitis, unspecified trigger - cetirizine (ZYRTEC) 10 MG tablet; Take 1 tablet (10 mg total) by mouth daily.  Dispense: 90 tablet; Refill: 3 - carboxymethylcellulose 1 % ophthalmic solution; Apply 1 drop to eye 3 (three) times daily.  Dispense: 30 mL; Refill: 12 - ketotifen (ZADITOR) 0.025 % ophthalmic solution; Place 1 drop into both eyes 2 (two) times daily.  Dispense: 5 mL; Refill: 0  9. Encounter for screening for metabolic disorder - Comprehensive metabolic panel  Denita Lung, RN, Adult-Geriatric Nurse Practitioner Student

## 2017-11-16 NOTE — Progress Notes (Signed)
Subjective:    Patient ID: Teresa Mcgee, female    DOB: 1965-11-08, 52 y.o.   MRN: [id number]  HPI This is a 52 yo female who presents today to establish care. She was previously cared for by Dr. Alyson Ingles but his office has closed. Saw him last in Feb. She thinks.  She has been disabled since 2011 with chronic back pain. She is married but is separated from her husband, they live apart but she helps care for him.. She lives with her 73 yo son who is mentally disabled.  Enjoys spending time with her sisters.  From a family of 75 children (some are half siblings). One brother died this year from MI. Had history of stroke x 2.   Last CPE-approximately 2 years ago Mammo- 2010 Pap- has been a long time ago Colonoscopy- 2008 at [facility] Tdap- 04/25/2011 Flu- some years Eye- 05/2017 Dental- edentulous, has dentures but did not wear them today Exercise- none outside of housework Sleep- poor due to back pain  OSA- machine broke, last sleep study 2008 or 2010.   Chronic low back pain- takes naproxyn on days she does not take oxycodone- acetaminophen 10-325 (08/29/17, 90 no refills per her bottle that she brings with her today, no medication bottle). Was previously seen at pain clinic by Dr. Claybon Jabs who was released back to her PCP. No relief with gabapentin. Occasional sciatica. No recent falls. Some giving out of knee if she climb stairs frequently.   Tobacco abuse- she has been cutting down on her smoking and thinks she is smoking a pack or less per week.  She is actively trying to quit.  She has successfully quit in the past, doing it on her own without nicotine replacement or medication.  Allergic rhinitis -she generally takes daily cetirizine but is out.  She reports itchy watery eyes.  Unable to tolerate fluticasone nasal spray, it causes her to have sores in her nose.  Hypothyroidism- currently on 200 mcq levothyroxine, not sure when labs were last checked.  No shortness of  breath, no chest pain, no LE edema, occasional indigestion (hiatal hernia), no nausea/vomiting or diarrhea.   Past Medical History:  Diagnosis Date  . Arthritis   . Bell's palsy may 2012  . Chest pain   . GERD (gastroesophageal reflux disease)   . H/O hiatal hernia   . Hyperlipidemia   . Hypertension   . Hypothyroidism   . Shingles   . Shortness of breath   . Sleep apnea    uses cpap   Past Surgical History:  Procedure Laterality Date  . CESAREAN SECTION    . cesarian     3x  . PARTIAL HYSTERECTOMY  2000   abnormal uterine bleeding  . THYROIDECTOMY  1998  . TOOTH EXTRACTION    . TUBAL LIGATION     Family History  Problem Relation Age of Onset  . Heart attack Father 29       deceased  . Diabetes Brother   . Stroke Brother 25       twice  . Stroke Sister 52  . Diabetes Sister   . Heart attack Sister 77  . Heart attack Brother 60   Social History   Tobacco Use  . Smoking status: Current Some Day Smoker    Packs/day: 0.30    Years: 20.00    Pack years: 6.00    Types: Cigarettes  . Smokeless tobacco: Never Used  . Tobacco comment: used to smoke 1.5 -  2 PPD  Substance Use Topics  . Alcohol use: No  . Drug use: No        Review of Systems  Eyes: Positive for redness and itching.       Has been using dry eye and allergy drops. Feels like she has gravel in her eyes.   Also, refer to HPI     Objective:   Physical Exam  Constitutional: She is oriented to person, place, and time. She appears well-developed and well-nourished. No distress.  HENT:  Head: Normocephalic and atraumatic.  Mouth/Throat: Oropharynx is clear and moist. Abnormal dentition (fully endentulous).  Eyes: Conjunctivae are normal.  Neck: Normal range of motion. Neck supple.  Cardiovascular: Normal rate, regular rhythm and normal heart sounds.  Pulmonary/Chest: Effort normal and breath sounds normal.  Musculoskeletal: She exhibits no edema.  Neurological: She is alert and oriented to  person, place, and time.  Skin: Skin is warm and dry. She is not diaphoretic.  Psychiatric: She has a normal mood and affect. Her behavior is normal. Judgment and thought content normal.  Vitals reviewed.    BP 118/82   Pulse 80   Temp 98 F (36.7 C) (Oral)   Ht 5' 2.25" (1.581 m)   Wt 252 lb 12 oz (114.6 kg)   SpO2 97%   BMI 45.86 kg/m  Wt Readings from Last 3 Encounters:  11/16/17 252 lb 12 oz (114.6 kg)  06/28/16 246 lb (111.6 kg)  05/11/16 246 lb (111.6 kg)        Assessment & Plan:  1. Encounter to establish care -We will request records from prior PCP  2. Essential hypertension -Well-controlled today, will continue current medications - amLODipine (NORVASC) 10 MG tablet; Take 1 tablet (10 mg total) by mouth daily.  Dispense: 90 tablet; Refill: 3 - hydrochlorothiazide (HYDRODIURIL) 25 MG tablet; Take 1 tablet (25 mg total) by mouth daily. as directed  Dispense: 90 tablet; Refill: 3 - Comprehensive metabolic panel - CBC with Differential  3. Obesity (BMI 35.0-39.9 without comorbidity) -We will prioritize smoking cessation and then advise on healthy food choices at follow-up - Comprehensive metabolic panel - CBC with Differential - Lipid Panel - Hemoglobin A1c  4. Chronic bilateral low back pain with bilateral sciatica -Requesting records from PCP, we will not prescribe any controlled medications for pain today -Continue naproxen, heat/ice, exercise as tolerated  5. Obstructive sleep apnea syndrome -We will request records and see if she needs another sleep study or if CPAP machine can be ordered for her based on her records -Important to resume use of CPAP and emphasized this with patient  6. Acquired hypothyroidism - TSH  7. Tobacco abuse -Encouraged her efforts at smoking cessation and provided written information  8. Seasonal allergic rhinitis, unspecified trigger - cetirizine (ZYRTEC) 10 MG tablet; Take 1 tablet (10 mg total) by mouth daily.  Dispense:  90 tablet; Refill: 3 - carboxymethylcellulose 1 % ophthalmic solution; Apply 1 drop to eye 3 (three) times daily.  Dispense: 30 mL; Refill: 12 - ketotifen (ZADITOR) 0.025 % ophthalmic solution; Place 1 drop into both eyes 2 (two) times daily.  Dispense: 5 mL; Refill: 0  - will have her follow up for CPE   Clarene Reamer, FNP-BC  [facility]  11/17/2017 10:00 AM

## 2017-11-16 NOTE — Patient Instructions (Addendum)
Please schedule your mammogram-  Please call and schedule an appointment for screening mammogram. A referral is not needed.  [phone]  Please schedule a complete physical exam for 2-4 weeks from today   Coping with Quitting Smoking Quitting smoking is a physical and mental challenge. You will face cravings, withdrawal symptoms, and temptation. Before quitting, work with your health care provider to make a plan that can help you cope. Preparation can help you quit and keep you from giving in. How can I cope with cravings? Cravings usually last for 5-10 minutes. If you get through it, the craving will pass. Consider taking the following actions to help you cope with cravings:  Keep your mouth busy: ? Chew sugar-free gum. ? Suck on hard candies or a straw. ? Brush your teeth.  Keep your hands and body busy: ? Immediately change to a different activity when you feel a craving. ? Squeeze or play with a ball. ? Do an activity or a hobby, like making bead jewelry, practicing needlepoint, or working with wood. ? Mix up your normal routine. ? Take a short exercise break. Go for a quick walk or run up and down stairs. ? Spend time in public places where smoking is not allowed.  Focus on doing something kind or helpful for someone else.  Call a friend or family member to talk during a craving.  Join a support group.  Call a quit line, such as 1-800-QUIT-NOW.  Talk with your health care provider about medicines that might help you cope with cravings and make quitting easier for you.  How can I deal with withdrawal symptoms? Your body may experience negative effects as it tries to get used to not having nicotine in the system. These effects are called withdrawal symptoms. They may include:  Feeling hungrier than normal.  Trouble concentrating.  Irritability.  Trouble sleeping.  Feeling depressed.  Restlessness and agitation.  Craving a  cigarette.  To manage withdrawal symptoms:  Avoid places, people, and activities that trigger your cravings.  Remember why you want to quit.  Get plenty of sleep.  Avoid coffee and other caffeinated drinks. These may worsen some of your symptoms.  How can I handle social situations? Social situations can be difficult when you are quitting smoking, especially in the first few weeks. To manage this, you can:  Avoid parties, bars, and other social situations where people might be smoking.  Avoid alcohol.  Leave right away if you have the urge to smoke.  Explain to your family and friends that you are quitting smoking. Ask for understanding and support.  Plan activities with friends or family where smoking is not an option.  What are some ways I can cope with stress? Wanting to smoke may cause stress, and stress can make you want to smoke. Find ways to manage your stress. Relaxation techniques can help. For example:  Breathe slowly and deeply, in through your nose and out through your mouth.  Listen to soothing, relaxing music.  Talk with a family member or friend about your stress.  Light a candle.  Soak in a bath or take a shower.  Think about a peaceful place.  What are some ways I can prevent weight gain? Be aware that many people gain weight after they quit smoking. However, not everyone does. To keep from gaining weight, have a plan in place before you quit and stick to the plan after you quit. Your plan should include:  Having  healthy snacks. When you have a craving, it may help to: ? Eat plain popcorn, crunchy carrots, celery, or other cut vegetables. ? Chew sugar-free gum.  Changing how you eat: ? Eat small portion sizes at meals. ? Eat 4-6 small meals throughout the day instead of 1-2 large meals a day. ? Be mindful when you eat. Do not watch television or do other things that might distract you as you eat.  Exercising regularly: ? Make time to exercise each  day. If you do not have time for a long workout, do short bouts of exercise for 5-10 minutes several times a day. ? Do some form of strengthening exercise, like weight lifting, and some form of aerobic exercise, like running or swimming.  Drinking plenty of water or other low-calorie or no-calorie drinks. Drink 6-8 glasses of water daily, or as much as instructed by your health care provider.  Summary  Quitting smoking is a physical and mental challenge. You will face cravings, withdrawal symptoms, and temptation to smoke again. Preparation can help you as you go through these challenges.  You can cope with cravings by keeping your mouth busy (such as by chewing gum), keeping your body and hands busy, and making calls to family, friends, or a helpline for people who want to quit smoking.  You can cope with withdrawal symptoms by avoiding places where people smoke, avoiding drinks with caffeine, and getting plenty of rest.  Ask your health care provider about the different ways to prevent weight gain, avoid stress, and handle social situations. This information is not intended to replace advice given to you by your health care provider. Make sure you discuss any questions you have with your health care provider. Document Released: 07/28/2016 Document Revised: 07/28/2016 Document Reviewed: 07/28/2016 Elsevier Interactive Patient Education  [employer].

## 2017-11-17 LAB — LIPID PANEL
CHOL/HDL RATIO: 5.2 (calc) — AB (ref <5.0)
Cholesterol: 228 mg/dL — ABNORMAL HIGH (ref <200)
HDL: 44 mg/dL — ABNORMAL LOW (ref >50)
LDL CHOLESTEROL (CALC): 159 mg/dL — AB
Non-HDL Cholesterol (Calc): 184 mg/dL (calc) — ABNORMAL HIGH (ref <130)
Triglycerides: 130 mg/dL (ref <150)

## 2017-11-17 LAB — CBC WITH DIFFERENTIAL/PLATELET
BASOS ABS: 58 {cells}/uL (ref 0–200)
Basophils Relative: 1 %
EOS ABS: 191 {cells}/uL (ref 15–500)
Eosinophils Relative: 3.3 %
HEMATOCRIT: 41.9 % (ref 35.0–45.0)
HEMOGLOBIN: 14.5 g/dL (ref 11.7–15.5)
Lymphs Abs: 2987 cells/uL (ref 850–3900)
MCH: 29.6 pg (ref 27.0–33.0)
MCHC: 34.6 g/dL (ref 32.0–36.0)
MCV: 85.5 fL (ref 80.0–100.0)
MONOS PCT: 7 %
MPV: 12.9 fL — ABNORMAL HIGH (ref 7.5–12.5)
NEUTROS ABS: 2158 {cells}/uL (ref 1500–7800)
Neutrophils Relative %: 37.2 %
Platelets: 192 10*3/uL (ref 140–400)
RBC: 4.9 10*6/uL (ref 3.80–5.10)
RDW: 13.1 % (ref 11.0–15.0)
Total Lymphocyte: 51.5 %
WBC mixed population: 406 cells/uL (ref 200–950)
WBC: 5.8 10*3/uL (ref 3.8–10.8)

## 2017-11-17 LAB — TSH

## 2017-11-17 LAB — COMPREHENSIVE METABOLIC PANEL
AG RATIO: 1.2 (calc) (ref 1.0–2.5)
ALT: 81 U/L — AB (ref 6–29)
AST: 78 U/L — ABNORMAL HIGH (ref 10–35)
Albumin: 4.2 g/dL (ref 3.6–5.1)
Alkaline phosphatase (APISO): 83 U/L (ref 33–130)
BILIRUBIN TOTAL: 0.5 mg/dL (ref 0.2–1.2)
BUN: 8 mg/dL (ref 7–25)
CALCIUM: 9.8 mg/dL (ref 8.6–10.4)
CHLORIDE: 100 mmol/L (ref 98–110)
CO2: 33 mmol/L — AB (ref 20–32)
Creat: 0.7 mg/dL (ref 0.50–1.05)
GLUCOSE: 95 mg/dL (ref 65–99)
Globulin: 3.5 g/dL (calc) (ref 1.9–3.7)
Potassium: 3.3 mmol/L — ABNORMAL LOW (ref 3.5–5.3)
Sodium: 141 mmol/L (ref 135–146)
TOTAL PROTEIN: 7.7 g/dL (ref 6.1–8.1)

## 2017-11-17 LAB — HEMOGLOBIN A1C
EAG (MMOL/L): 7.7 (calc)
HEMOGLOBIN A1C: 6.5 %{Hb} — AB (ref <5.7)
MEAN PLASMA GLUCOSE: 140 (calc)

## 2017-11-26 ENCOUNTER — Ambulatory Visit (INDEPENDENT_AMBULATORY_CARE_PROVIDER_SITE_OTHER): Payer: Medicare HMO | Admitting: Family Medicine

## 2017-11-26 ENCOUNTER — Encounter: Payer: Self-pay | Admitting: Family Medicine

## 2017-11-26 VITALS — BP 128/82 | HR 78 | Temp 97.7°F | Ht 62.0 in | Wt 253.2 lb

## 2017-11-26 DIAGNOSIS — G8929 Other chronic pain: Secondary | ICD-10-CM | POA: Diagnosis not present

## 2017-11-26 DIAGNOSIS — I10 Essential (primary) hypertension: Secondary | ICD-10-CM

## 2017-11-26 DIAGNOSIS — J302 Other seasonal allergic rhinitis: Secondary | ICD-10-CM

## 2017-11-26 DIAGNOSIS — Z720 Tobacco use: Secondary | ICD-10-CM

## 2017-11-26 DIAGNOSIS — Z0000 Encounter for general adult medical examination without abnormal findings: Secondary | ICD-10-CM | POA: Diagnosis not present

## 2017-11-26 DIAGNOSIS — M5441 Lumbago with sciatica, right side: Secondary | ICD-10-CM | POA: Diagnosis not present

## 2017-11-26 DIAGNOSIS — R7303 Prediabetes: Secondary | ICD-10-CM

## 2017-11-26 DIAGNOSIS — M5442 Lumbago with sciatica, left side: Secondary | ICD-10-CM | POA: Diagnosis not present

## 2017-11-26 MED ORDER — OXYCODONE-ACETAMINOPHEN 10-325 MG PO TABS: 1.0000 | ORAL_TABLET | Freq: Two times a day (BID) | ORAL | 0 refills | Status: DC | PRN

## 2017-11-26 MED ORDER — LEVOTHYROXINE SODIUM 100 MCG PO TABS: 100.0000 ug | ORAL_TABLET | Freq: Every day | ORAL | 0 refills | Status: DC

## 2017-11-26 MED ORDER — GABAPENTIN 100 MG PO CAPS: ORAL_CAPSULE | ORAL | 3 refills | Status: DC

## 2017-11-26 NOTE — Patient Instructions (Addendum)
I have sent in a new prescription for your levothyroxine 100 mcg  Please schedule follow up in 6 weeks to recheck your blood work  Please call and schedule an appointment for screening mammogram. A referral is not needed.  [phone]  I have sent in a prescription for pain medication in a small, limited amount. This is all I can prescribe for you. I have put in a referral to pain management, you should be getting a call about this.   You have pre-diabetes, please read the below information regarding an eating plan  Prediabetes Eating Plan Prediabetes-also called impaired glucose tolerance or impaired fasting glucose-is a condition that causes blood sugar (blood glucose) levels to be higher than normal. Following a healthy diet can help to keep prediabetes under control. It can also help to lower the risk of type 2 diabetes and heart disease, which are increased in people who have prediabetes. Along with regular exercise, a healthy diet:  Promotes weight loss.  Helps to control blood sugar levels.  Helps to improve the way that the body uses insulin.  What do I need to know about this eating plan?  Use the glycemic index (GI) to plan your meals. The index tells you how quickly a food will raise your blood sugar. Choose low-GI foods. These foods take a longer time to raise blood sugar.  Pay close attention to the amount of carbohydrates in the food that you eat. Carbohydrates increase blood sugar levels.  Keep track of how many calories you take in. Eating the right amount of calories will help you to achieve a healthy weight. Losing about 7 percent of your starting weight can help to prevent type 2 diabetes.  You may want to follow a Mediterranean diet. This diet includes a lot of vegetables, lean meats or fish, whole grains, fruits, and healthy oils and fats. What foods can I eat? Grains Whole grains, such as whole-wheat or whole-grain breads,  crackers, cereals, and pasta. Unsweetened oatmeal. Bulgur. Barley. Quinoa. Brown rice. Corn or whole-wheat flour tortillas or taco shells. Vegetables Lettuce. Spinach. Peas. Beets. Cauliflower. Cabbage. Broccoli. Carrots. Tomatoes. Squash. Eggplant. Herbs. Peppers. Onions. Cucumbers. Brussels sprouts. Fruits Berries. Bananas. Apples. Oranges. Grapes. Papaya. Mango. Pomegranate. Kiwi. Grapefruit. Cherries. Meats and Other Protein Sources Seafood. Lean meats, such as chicken and [place] or lean cuts of pork and beef. Tofu. Eggs. Nuts. Beans. Dairy Low-fat or fat-free dairy products, such as yogurt, cottage cheese, and cheese. Beverages Water. Tea. Coffee. Sugar-free or diet soda. Seltzer water. Milk. Milk alternatives, such as soy or almond milk. Condiments Mustard. Relish. Low-fat, low-sugar ketchup. Low-fat, low-sugar barbecue sauce. Low-fat or fat-free mayonnaise. Sweets and Desserts Sugar-free or low-fat pudding. Sugar-free or low-fat ice cream and other frozen treats. Fats and Oils Avocado. Walnuts. Olive oil. The items listed above may not be a complete list of recommended foods or beverages. Contact your dietitian for more options. What foods are not recommended? Grains Refined white flour and flour products, such as bread, pasta, snack foods, and cereals. Beverages Sweetened drinks, such as sweet iced tea and soda. Sweets and Desserts Baked goods, such as cake, cupcakes, pastries, cookies, and cheesecake. The items listed above may not be a complete list of foods and beverages to avoid. Contact your dietitian for more information. This information is not intended to replace advice given to you by your health care provider. Make sure you discuss any questions you have with your health care provider. Document Released: 12/15/2014 Document  Revised: 01/06/2016 Document Reviewed: 08/26/2014 Elsevier Interactive Patient Education  2017 [employer].

## 2017-11-26 NOTE — Progress Notes (Signed)
Subjective:     Patient ID: Teresa Mcgee, female   DOB: 19-Nov-1965, 52 y.o.   MRN: [id number]  HPI This is a 52 yo female who presents today for CPE.   Was seen two weeks ago to establish care and presents today for follow up and CPE.  Labs done found her to have undetectable TSH, levothyroxine was decreased from 200 mcg to 100 mcg.   Has chronic low back pain, was previously seen by Dr. Alyson Ingles and was on chronic pain medication. Has been seen at pain clinic prior and was released to PCP according to patient. Has tried gabapentin in past, but made her really sleepy.  Was on tramadol first then had a fall and was placed on Norco. She reports daily, constant pain. Unable to sleep due to pain, exhausted, anxious. Scratching her face and arms due to pain and stress.   Last CPE- unsure, maybe 2 years ago Mammo- over due Pap- hysterectomy Colonoscopy- 2008 Tdap- 04/25/11 Flu- gets some years Eye- 05/2017 Dental- endutulous Exercise- unable due to back pain  Past Medical History:  Diagnosis Date  . Arthritis   . Bell's palsy may 2012  . Chest pain   . GERD (gastroesophageal reflux disease)   . H/O hiatal hernia   . Hyperlipidemia   . Hypertension   . Hypothyroidism   . Shingles   . Shortness of breath   . Sleep apnea    uses cpap   Past Surgical History:  Procedure Laterality Date  . CESAREAN SECTION    . cesarian     3x  . PARTIAL HYSTERECTOMY  2000   abnormal uterine bleeding  . THYROIDECTOMY  1998  . TOOTH EXTRACTION    . TUBAL LIGATION     Family History  Problem Relation Age of Onset  . Heart attack Father 35       deceased  . Diabetes Brother   . Stroke Brother 25       twice  . Stroke Sister 44  . Diabetes Sister   . Heart attack Sister 78  . Heart attack Brother 55   Social History   Tobacco Use  . Smoking status: Current Some Day Smoker    Packs/day: 0.30    Years: 20.00    Pack years: 6.00    Types: Cigarettes  . Smokeless tobacco: Never  Used  . Tobacco comment: used to smoke 1.5 - 2 PPD  Substance Use Topics  . Alcohol use: No  . Drug use: No     Review of Systems  Constitutional: Negative.   HENT: Positive for nosebleeds.   Eyes: Positive for discharge and itching.  Respiratory: Negative.   Cardiovascular: Negative.   Gastrointestinal: Negative.   Endocrine: Negative.   Genitourinary: Negative.   Musculoskeletal: Positive for back pain and neck pain.  Skin: Negative.   Allergic/Immunologic: Positive for environmental allergies.  Neurological: Negative.   Hematological: Bruises/bleeds easily.  Psychiatric/Behavioral: Positive for sleep disturbance. The patient is nervous/anxious.        Objective:   Physical Exam  Constitutional: She is oriented to person, place, and time. She appears well-developed and well-nourished. No distress.  obese  HENT:  Head: Normocephalic and atraumatic.  Mouth/Throat: Oropharynx is clear and moist.  Eyes: Conjunctivae are normal.  Neck: Normal range of motion. Neck supple. No thyromegaly present.  Cardiovascular: Normal rate, regular rhythm and normal heart sounds.  Pulmonary/Chest: Effort normal and breath sounds normal. Right breast exhibits no inverted nipple, no mass, no  nipple discharge, no skin change and no tenderness. Left breast exhibits no inverted nipple, no mass, no nipple discharge, no skin change and no tenderness.  Abdominal: Soft. Bowel sounds are normal.  Musculoskeletal: She exhibits no edema.  Entire back with diffuse tenderness, decreased ROM of back and bilateral hips.   Lymphadenopathy:    She has no cervical adenopathy.  Neurological: She is alert and oriented to person, place, and time.  Skin: Skin is warm and dry. She is not diaphoretic.  Psychiatric: Her speech is normal and behavior is normal. Her mood appears anxious.  Vitals reviewed.     BP 128/82 (BP Location: Right Arm, Patient Position: Sitting, Cuff Size: Normal)   Pulse 78   Temp 97.7  F (36.5 C) (Oral)   Ht 5\' 2"  (1.575 m)   Wt 253 lb 4 oz (114.9 kg)   SpO2 95%   BMI 46.32 kg/m  Depression screen [facility] 2/9 11/26/2017 12/26/2011  Decreased Interest 0 0  Down, Depressed, Hopeless 0 0  PHQ - 2 Score 0 0    Assessment:     1. Annual physical exam - Discussed and encouraged healthy lifestyle choices- adequate sleep, regular exercise, stress management and healthy food choices.  - provided information to schedule screening mammogram  2. Chronic bilateral low back pain with bilateral sciatica - referring her to pain management, will give her very small amount oxycodone x 1 and restart her gabapentin to decrease pain, improve sleep - discussed with her that I would not give her any additional pain medication - [facility] database reviewed, last prescription filled 10/02/17 - gabapentin (NEURONTIN) 100 MG capsule; Take 1 tablet at bedtime x 5 days then increase to 2 tablets at bedtime  Dispense: 60 capsule; Refill: 3 - oxyCODONE-acetaminophen (PERCOCET) 10-325 MG tablet; Take 1 tablet by mouth every 12 (twelve) hours as needed for pain.  Dispense: 10 tablet; Refill: 0 - Ambulatory referral to Pain Clinic  3. Essential hypertension - well controlled on current meds  4. Seasonal allergic rhinitis, unspecified trigger - encouraged otc antihistamine  5. Tobacco abuse - encouraged continued reduction with goal of smoking cessation  6. Prediabetes - Provided written and verbal information regarding diagnosis and treatment. - recheck in 6 months  Clarene Reamer, FNP-BC  [facility], [place] [facility]  12/02/2017 6:13 PM     Plan:     See above

## 2017-12-02 ENCOUNTER — Encounter: Payer: Self-pay | Admitting: Family Medicine

## 2017-12-07 ENCOUNTER — Ambulatory Visit: Payer: Self-pay | Admitting: *Deleted

## 2017-12-07 NOTE — Telephone Encounter (Signed)
Patient phoned with pain above her eyes. She describes it as pressure. Also has some mild nasal pressure under her eyes. Positive for post nasal drip. These symptoms have occurred over the last week. Denies any other symptoms. Care advice reviewed. She will try advice over weekend and if no improvement, she will phone Monday.  Reason for Disposition . Headache  Answer Assessment - Initial Assessment Questions 1. LOCATION: "Where does it hurt?"      Above eyes both sides 2. ONSET: "When did the headache start?" (Minutes, hours or days)     Approximately 1 week ago 3. PATTERN: "Does the pain come and go, or has it been constant since it started?"    Comes and goes 4. SEVERITY: "How bad is the pain?" and "What does it keep you from doing?"  (e.g., Scale 1-10; mild, moderate, or severe)   - MILD (1-3): doesn't interfere with normal activities    - MODERATE (4-7): interferes with normal activities or awakens from sleep    - SEVERE (8-10): excruciating pain, unable to do any normal activities       Moderate-7 5. RECURRENT SYMPTOM: "Have you ever had headaches before?" If so, ask: "When was the last time?" and "What happened that time?"      Years ago 6. CAUSE: "What do you think is causing the headache?"    unsure 7. MIGRAINE: "Have you been diagnosed with migraine headaches?" If so, ask: "Is this headache similar?"    no 8. HEAD INJURY: "Has there been any recent injury to the head?"    no 9. OTHER SYMPTOMS: "Do you have any other symptoms?" (fever, stiff neck, eye pain, sore throat, cold symptoms)  clearing throat, sore throat with swallowing. Some facial pressure 10. PREGNANCY: "Is there any chance you are pregnant?" "When was your last menstrual period?"    no  Protocols used: HEADACHE-A-AH

## 2017-12-18 ENCOUNTER — Other Ambulatory Visit: Payer: Self-pay | Admitting: Family Medicine

## 2017-12-18 DIAGNOSIS — M5441 Lumbago with sciatica, right side: Principal | ICD-10-CM

## 2017-12-18 DIAGNOSIS — M5442 Lumbago with sciatica, left side: Principal | ICD-10-CM

## 2017-12-18 DIAGNOSIS — G8929 Other chronic pain: Secondary | ICD-10-CM

## 2018-01-09 ENCOUNTER — Ambulatory Visit (INDEPENDENT_AMBULATORY_CARE_PROVIDER_SITE_OTHER): Payer: Medicare HMO | Admitting: Family Medicine

## 2018-01-09 VITALS — BP 122/78 | HR 72 | Temp 97.7°F | Ht 62.0 in | Wt 254.0 lb

## 2018-01-09 DIAGNOSIS — M5442 Lumbago with sciatica, left side: Secondary | ICD-10-CM

## 2018-01-09 DIAGNOSIS — G8929 Other chronic pain: Secondary | ICD-10-CM

## 2018-01-09 DIAGNOSIS — E039 Hypothyroidism, unspecified: Secondary | ICD-10-CM | POA: Diagnosis not present

## 2018-01-09 DIAGNOSIS — G4733 Obstructive sleep apnea (adult) (pediatric): Secondary | ICD-10-CM

## 2018-01-09 DIAGNOSIS — I10 Essential (primary) hypertension: Secondary | ICD-10-CM | POA: Diagnosis not present

## 2018-01-09 DIAGNOSIS — M5441 Lumbago with sciatica, right side: Secondary | ICD-10-CM

## 2018-01-09 LAB — TSH: TSH: 3.59 u[IU]/mL (ref 0.35–4.50)

## 2018-01-09 MED ORDER — FLUTICASONE PROPIONATE 50 MCG/ACT NA SUSP: 2.0000 | Freq: Every day | NASAL | 6 refills | Status: DC

## 2018-01-09 NOTE — Patient Instructions (Addendum)
Try some cortisone cream on your arm twice a day for up to 10 days, stop if it makes places look worse.   Follow up in 4 months

## 2018-01-09 NOTE — Progress Notes (Signed)
Subjective:    Patient ID: Teresa Mcgee, female    DOB: 18-Dec-1965, 52 y.o.   MRN: [id number]  HPI This is a 52 yo female who presents today for follow up of HTN, hypothyroidism, chronic pain, OSA.   HTN-she denies medication side effects.  No chest pain, SOB, lower extremity edema.  Chronic pain-pain management referral in progress.  Patient has had difficulty getting in touch with the office to schedule an appointment.  Hypothyroidism-last TSH undetectable and her dose of levothyroxine was decreased from 200 mcg to 100 mcg.  OSA-patient reports having problems with her CPAP machine.  Not interested in follow-up right now.   Past Medical History:  Diagnosis Date  . Arthritis   . Bell's palsy may 2012  . Chest pain   . GERD (gastroesophageal reflux disease)   . H/O hiatal hernia   . Hyperlipidemia   . Hypertension   . Hypothyroidism   . Shingles   . Shortness of breath   . Sleep apnea    uses cpap   Past Surgical History:  Procedure Laterality Date  . CESAREAN SECTION    . cesarian     3x  . PARTIAL HYSTERECTOMY  2000   abnormal uterine bleeding  . THYROIDECTOMY  1998  . TOOTH EXTRACTION    . TUBAL LIGATION     Family History  Problem Relation Age of Onset  . Heart attack Father 77       deceased  . Diabetes Brother   . Stroke Brother 25       twice  . Stroke Sister 30  . Diabetes Sister   . Heart attack Sister 66  . Heart attack Brother 36   Social History   Tobacco Use  . Smoking status: Current Some Day Smoker    Packs/day: 0.30    Years: 20.00    Pack years: 6.00    Types: Cigarettes  . Smokeless tobacco: Never Used  . Tobacco comment: used to smoke 1.5 - 2 PPD  Substance Use Topics  . Alcohol use: No  . Drug use: No      Review of Systems Per HPI    Objective:   Physical Exam Physical Exam  Constitutional: Oriented to person, place, and time. She appears well-developed and well-nourished.  HENT:  Head: Normocephalic and  atraumatic.  Eyes: Conjunctivae are normal.  Neck: Normal range of motion. Neck supple.  Cardiovascular: Normal rate, regular rhythm and normal heart sounds.   Pulmonary/Chest: Effort normal and breath sounds normal.  Musculoskeletal: Normal range of motion.  Neurological: Alert and oriented to person, place, and time.  Skin: Skin is warm and dry.  Psychiatric: Normal mood and affect. Behavior is normal. Judgment and thought content normal.  Vitals reviewed.     BP 122/78 (BP Location: Right Arm, Patient Position: Sitting, Cuff Size: Large)   Pulse 72   Temp 97.7 F (36.5 C) (Oral)   Ht 5\' 2"  (1.575 m)   Wt 254 lb (115.2 kg)   SpO2 99%   BMI 46.46 kg/m  Wt Readings from Last 3 Encounters:  01/09/18 254 lb (115.2 kg)  11/26/17 253 lb 4 oz (114.9 kg)  11/16/17 252 lb 12 oz (114.6 kg)       Assessment & Plan:  1. Acquired hypothyroidism - TSH  2. Chronic bilateral low back pain with bilateral sciatica -Wiill have her see 1 of the referrals coordinators today to facilitate getting her an appointment with pain management  3. Essential  hypertension -Well-controlled on current medications  4. Obstructive sleep apnea syndrome -Not currently using CPAP but she wants to have pain management appointment prior to reestablishing with pulmonary; we will address this at her next visit  -Follow-up in 3 to 4 months  Clarene Reamer, FNP-BC  [facility], [place]  01/10/2018 10:50 AM

## 2018-01-10 ENCOUNTER — Encounter: Payer: Self-pay | Admitting: Family Medicine

## 2018-01-23 ENCOUNTER — Other Ambulatory Visit: Payer: Self-pay | Admitting: Family Medicine

## 2018-01-24 ENCOUNTER — Other Ambulatory Visit: Payer: Self-pay

## 2018-01-24 ENCOUNTER — Ambulatory Visit: Payer: Medicare HMO | Attending: Nurse Practitioner | Admitting: Nurse Practitioner

## 2018-01-24 ENCOUNTER — Encounter: Payer: Self-pay | Admitting: Nurse Practitioner

## 2018-01-24 DIAGNOSIS — M79601 Pain in right arm: Secondary | ICD-10-CM | POA: Diagnosis not present

## 2018-01-24 DIAGNOSIS — M5442 Lumbago with sciatica, left side: Secondary | ICD-10-CM | POA: Insufficient documentation

## 2018-01-24 DIAGNOSIS — Z789 Other specified health status: Secondary | ICD-10-CM | POA: Diagnosis not present

## 2018-01-24 DIAGNOSIS — M542 Cervicalgia: Secondary | ICD-10-CM | POA: Diagnosis not present

## 2018-01-24 DIAGNOSIS — M79605 Pain in left leg: Secondary | ICD-10-CM | POA: Insufficient documentation

## 2018-01-24 DIAGNOSIS — R7302 Impaired glucose tolerance (oral): Secondary | ICD-10-CM | POA: Insufficient documentation

## 2018-01-24 DIAGNOSIS — M79604 Pain in right leg: Secondary | ICD-10-CM | POA: Diagnosis not present

## 2018-01-24 DIAGNOSIS — G8929 Other chronic pain: Secondary | ICD-10-CM | POA: Diagnosis not present

## 2018-01-24 DIAGNOSIS — Z6835 Body mass index (BMI) 35.0-35.9, adult: Secondary | ICD-10-CM | POA: Diagnosis not present

## 2018-01-24 DIAGNOSIS — Z79891 Long term (current) use of opiate analgesic: Secondary | ICD-10-CM | POA: Insufficient documentation

## 2018-01-24 DIAGNOSIS — E669 Obesity, unspecified: Secondary | ICD-10-CM | POA: Insufficient documentation

## 2018-01-24 DIAGNOSIS — Z79899 Other long term (current) drug therapy: Secondary | ICD-10-CM | POA: Diagnosis not present

## 2018-01-24 DIAGNOSIS — E039 Hypothyroidism, unspecified: Secondary | ICD-10-CM | POA: Insufficient documentation

## 2018-01-24 DIAGNOSIS — M79602 Pain in left arm: Secondary | ICD-10-CM

## 2018-01-24 DIAGNOSIS — G894 Chronic pain syndrome: Secondary | ICD-10-CM | POA: Insufficient documentation

## 2018-01-24 DIAGNOSIS — M899 Disorder of bone, unspecified: Secondary | ICD-10-CM

## 2018-01-24 DIAGNOSIS — M5441 Lumbago with sciatica, right side: Secondary | ICD-10-CM | POA: Diagnosis not present

## 2018-01-24 NOTE — Progress Notes (Signed)
Patient's Name: Teresa Mcgee  MRN: [id number]  Referring Provider: Elby Beck, FNP  DOB: [birth year]  PCP: Elby Beck, FNP  DOS: 01/24/2018  Note by: Dionisio David NP  Service setting: Ambulatory outpatient  Specialty: Interventional Pain Management  Location: [facility] (AMB) Pain Management Facility    Patient type: New Patient    Primary Reason(s) for Visit: Initial Patient Evaluation CC: New Patient (Initial Visit)  HPI  Teresa Mcgee is a 52 y.o. year old, female patient, who comes today for an initial evaluation. She has Hypothyroidism; HYPERCHOLESTEROLEMIA; TOBACCO ABUSE; DEPRESSION; HYPERTENSION, BENIGN ESSENTIAL; GERD; Chronic bilateral low back pain; Sleep apnea; Fatigue; Nasal septal perforation; Muscle spasms of neck; Nasal mucositis (ulcerative); Numbness and tingling in right hand; Xerotic eczema; Strain of lumbar paraspinous muscle; Obesity (BMI 35.0-39.9 without comorbidity); Constipation; Chest pain; Impaired glucose tolerance; Osteopathic Exam Findings (Somatic Dysfunction); Chronic bilateral low back pain with bilateral sciatica ; Chronic pain of both lower extremities ; Chronic neck pain; Chronic upper extremity pain ; Chronic pain syndrome; Long term current use of opiate analgesic; Pharmacologic therapy; Disorder of skeletal system; and Problems influencing health status on their problem list.. Her primarily concern today is the New Patient (Initial Visit)  Pain Assessment: Location: Lower Back Radiating: Radiates from lower back down to knees and ankles bilateral.  Onset: More than a month ago Duration: Chronic pain Quality: Constant, Stabbing, Pressure, Nagging, Tingling, Sharp, Aching, Dull Severity: 8 /10 (subjective, self-reported pain score)  Note: Reported level is compatible with observation. Clinically the patient looks like a 2/10 A 2/10 is viewed as "Mild to Moderate" and described as noticeable and distracting. Impossible to hide from other people. More  frequent flare-ups. Still possible to adapt and function close to normal. It can be very annoying and may have occasional stronger flare-ups. With discipline, patients may get used to it and adapt. Information on the proper use of the pain scale provided to the patient today. When using our objective Pain Scale, levels between 6 and 10/10 are said to belong in an emergency room, as it progressively worsens from a 6/10, described as severely limiting, requiring emergency care not usually available at an outpatient pain management facility. At a 6/10 level, communication becomes difficult and requires great effort. Assistance to reach the emergency department may be required. Facial flushing and profuse sweating along with potentially dangerous increases in heart rate and blood pressure will be evident. Effect on ADL: Prolong walking, unable to work because of the pain, difficult for household chores Timing: Constant Modifying factors: Medications, resting  BP: 121/82  HR: 76  Onset and Duration: Sudden Cause of pain: Work related accident or event Severity: Getting worse Timing: Not influenced by the time of the day Aggravating Factors: Bending Alleviating Factors: Medications Associated Problems:  Quality of Pain: Pressure-like Previous Examinations or Tests: EMG/PNCV, MRI scan and X-rays Previous Treatments: Epidural steroid injections and Physical Therapy  The patient comes into the clinics today for the first time for a chronic pain management evaluation.  According to the patient her primary area pain is in her lower back.  States that she suffered a fall and 2006 while on the job.  He admits that the left side is greater than the right.  She describes it as a pulling type pain.  She denies any previous surgery.  She has had previous epidural steroid injections by Dr. Dossie Arbour in the past.  She states they were not effective.  She admits that she was told that  surgery was not an option she did  complete physical therapy in 2011 4-5 sessions.  She admits that this was not effective and may have aggravated the pain.  She denies any recent images.  Second area of pain is in her lower extremities.  She admits the pain goes down back of the leg. She admits that she has occasional numbness and tingling that goes all the way down into her feet.  She admits that she has weakness but does not use assistive devices.  She admits that she did have a nerve conduction study in [place].  Her third area pain is in her neck.  She admits the left side is greater than the right.  She denies any previous surgery or interventional therapy.  She admits that physical therapy in 2011 was also inclusive of her neck.    Her fourth area of pain is in her upper extremities.  She admits that the left arm is greater than the right.  She has numbness and tingling that goes down her arm and affects all of her fingers.  She denies any previous surgery, interventional therapy for her arms.  Today I took the time to provide the patient with information regarding this pain practice. The patient was informed that the practice is divided into two sections: an interventional pain management section, as well as a completely separate and distinct medication management section. I explained that there are procedure days for interventional therapies, and evaluation days for follow-ups and medication management. Because of the amount of documentation required during both, they are kept separated. This means that there is the possibility that she may be scheduled for a procedure on one day, and medication management the next. I have also informed her that because of staffing and facility limitations, this practice will no longer take patients for medication management only. To illustrate the reasons for this, I gave the patient the example of surgeons, and how inappropriate it would be to refer a patient to his/her care, just to write for  the post-surgical antibiotics on a surgery done by a different surgeon.   Because interventional pain management is part of the board-certified specialty for the doctors, the patient was informed that joining this practice means that they are open to any and all interventional therapies. I made it clear that this does not mean that they will be forced to have any procedures done. What this means is that I believe interventional therapies to be essential part of the diagnosis and proper management of chronic pain conditions. Therefore, patients not interested in these interventional alternatives will be better served under the care of a different practitioner.  The patient was also made aware of my Comprehensive Pain Management Safety Guidelines where by joining this practice, they limit all of their nerve blocks and joint injections to those done by our practice, for as long as we are retained to manage their care. Historic Controlled Substance Pharmacotherapy Review  PMP and historical list of controlled substances: Oxycodone/acetaminophen 10/325, hydrocodone/chlorophen ER suspension, oxycodone/acetaminophen  5/325 g, tramadol 50 mg, hydrocodone/acetaminophen 5/500 mg Highest opioid analgesic regimen found: hydrocodone/acetaminophen 5/325 mg 2 tablets every 4 hours entheses fill date 08/05/2014) hydrocodone 60 mg/day Most recent opioid analgesic: Oxycodone/acetaminophen 10/325 mg 1 tablet 4 times daily (fill date 12/28/2017) oxycodone 20 mg/day Current opioid analgesics: None Highest recorded MME/day: 60 mg/day MME/day: 0 mg/day Medications: The patient did not bring the medication(s) to the appointment, as requested in our "New Patient Package" Pharmacodynamics: Desired effects:  Analgesia: The patient reports >50% benefit. Reported improvement in function: The patient reports medication allows her to accomplish basic ADLs. Clinically meaningful improvement in function (CMIF): Sustained CMIF goals  met Perceived effectiveness: Described as relatively effective, allowing for increase in activities of daily living (ADL) Undesirable effects: Side-effects or Adverse reactions: None reported Historical Monitoring: The patient  reports that she does not use drugs. List of all UDS Test(s): Lab Results  Component Value Date   COCAINSCRNUR NONE DETECTED 08/25/2014   THCU NONE DETECTED 08/25/2014   List of all Serum Drug Screening Test(s):  No results found for: AMPHSCRSER, BARBSCRSER, BENZOSCRSER, COCAINSCRSER, PCPSCRSER, PCPQUANT, THCSCRSER, CANNABQUANT, OPIATESCRSER, OXYSCRSER, PROPOXSCRSER Historical Background Evaluation: [place] PDMP: Six (6) year initial data search conducted.             [place] Department of public safety, offender search: Editor, commissioning Information) Non-contributory Risk Assessment Profile: Aberrant behavior: None observed or detected today Risk factors for fatal opioid overdose: None identified today Fatal overdose hazard ratio (HR): Calculation deferred Non-fatal overdose hazard ratio (HR): Calculation deferred Risk of opioid abuse or dependence: 0.7-3.0% with doses ? 36 MME/day and 6.1-26% with doses ? 120 MME/day. Substance use disorder (SUD) risk level: Pending results of Medical Psychology Evaluation for SUD Opioid risk tool (ORT) (Total Score): 1  ORT Scoring interpretation table:  Score <3 = Low Risk for SUD  Score between 4-7 = Moderate Risk for SUD  Score >8 = High Risk for Opioid Abuse   PHQ-2 Depression Scale:  Total score: 1  PHQ-2 Scoring interpretation table: (Score and probability of major depressive disorder)  Score 0 = No depression  Score 1 = 15.4% Probability  Score 2 = 21.1% Probability  Score 3 = 38.4% Probability  Score 4 = 45.5% Probability  Score 5 = 56.4% Probability  Score 6 = 78.6% Probability   PHQ-9 Depression Scale:  Total score: 1  PHQ-9 Scoring interpretation table:  Score 0-4 = No depression  Score 5-9 = Mild depression  Score  10-14 = Moderate depression  Score 15-19 = Moderately severe depression  Score 20-27 = Severe depression (2.4 times higher risk of SUD and 2.89 times higher risk of overuse)   Pharmacologic Plan: Pending ordered tests and/or consults  Meds  The patient has a current medication list which includes the following prescription(s): amlodipine, carboxymethylcellulose, cetirizine, fluticasone, gabapentin, hydrochlorothiazide, ketotifen, levothyroxine, naproxen, and refresh celluvisc.  Current Outpatient Medications on File Prior to Visit  Medication Sig  . amLODipine (NORVASC) 10 MG tablet Take 1 tablet (10 mg total) by mouth daily.  . carboxymethylcellulose 1 % ophthalmic solution Apply 1 drop to eye 3 (three) times daily.  . cetirizine (ZYRTEC) 10 MG tablet Take 1 tablet (10 mg total) by mouth daily.  . fluticasone (FLONASE) 50 MCG/ACT nasal spray Place 2 sprays into both nostrils daily.  Marland Kitchen gabapentin (NEURONTIN) 100 MG capsule TAKE 1 CAPSULE AT BEDTIME X 5 DAYS THEN INCREASE TO 2 CAPSULES AT BEDTIME  . hydrochlorothiazide (HYDRODIURIL) 25 MG tablet Take 1 tablet (25 mg total) by mouth daily. as directed  . ketotifen (ZADITOR) 0.025 % ophthalmic solution Place 1 drop into both eyes 2 (two) times daily.  Marland Kitchen levothyroxine (SYNTHROID, LEVOTHROID) 100 MCG tablet TAKE 1 TABLET BY MOUTH EVERY DAY  . naproxen (NAPROSYN) 500 MG tablet Take 500 mg by mouth 2 (two) times daily with a meal.  . REFRESH CELLUVISC 1 % GEL APPLY 1 DROP TO EYE 3 (THREE) TIMES DAILY.   No current facility-administered medications on  file prior to visit.    Imaging Review  Cervical Imaging:  Results for orders placed during the hospital encounter of 05/18/13  CT Cervical Spine Wo Contrast   Narrative *RADIOLOGY REPORT*  Clinical Data:  Arm pain, headache.  CT HEAD WITHOUT CONTRAST CT CERVICAL SPINE WITHOUT CONTRAST  Technique:  Multidetector CT imaging of the head and cervical spine was performed following the standard  protocol without intravenous contrast.  Multiplanar CT image reconstructions of the cervical spine were also generated.  Comparison:  None available at time of study interpretation.  CT HEAD  Findings: The ventricles and sulci are normal.  No intraparenchymal hemorrhage, mass effect nor midline shift.  No acute large vascular territory infarcts.  No abnormal extra-axial fluid collections.  Basal cisterns are patent.  No skull fracture.  Visualized paranasal sinuses and mastoid aircells are well-aerated.  The included ocular globes and orbital contents are non-suspicious. The patient is edentulous.  IMPRESSION: No acute intracranial process.  Normal noncontrast CT of the head for age.  CT CERVICAL SPINE  Findings: Cervical vertebrae appear intact and aligned with straightened cervical lordosis. Intervertebral disc heights generally preserved. Mild ventral endplate spurring at T4-1.  No destructive bony lesions.  Included prevertebral paraspinal soft tissues are non suspicious.  Minimal calcific atherosclerosis of the carotid bulbs.  Slight effacement left piriformis sinus may reflect be secondary to secretions.  Limited assessment for disc bulge due to the larger body habitus. No definite canal stenosis or neural foraminal narrowing.  IMPRESSION: No acute cervical spine fracture or malalignment.  Straightened cervical lordosis.   Original Report Authenticated By: Elon Alas      Lumbosacral Imaging:  Lumbar DG (Complete) 4+V:  Results for orders placed during the hospital encounter of 03/03/14  DG Lumbar Spine Complete   Narrative CLINICAL DATA:  Lower back pain post fall  EXAM: LUMBAR SPINE - COMPLETE 4+ VIEW  COMPARISON:  10/10/2010  FINDINGS: Five views of lumbar spine submitted. No acute fracture or subluxation. Again noted mild degenerative changes and T11-T12 level.  IMPRESSION: No acute fracture or subluxation. Mild degenerative changes  lower thoracic spine.   Electronically Signed   By: Lahoma Crocker M.D.   On: 03/03/2014 20:58     Knee Imaging:  Knee-R DG 4 views:  Results for orders placed during the hospital encounter of 01/27/14  DG Knee Complete 4 Views Right   Narrative CLINICAL DATA:  Pain  EXAM: RIGHT KNEE - COMPLETE 4+ VIEW  COMPARISON:  None.  FINDINGS: Frontal, lateral, and bilateral oblique views were obtained. There is no fracture, dislocation, or effusion. Joint spaces appear intact. No erosive change.  IMPRESSION: No fracture or effusion.  No appreciable arthropathy.   Electronically Signed   By: Lowella Grip M.D.   On: 01/27/2014 08:47     Note: Available results from prior imaging studies were reviewed.        ROS  Cardiovascular History: No reported cardiovascular signs or symptoms such as High blood pressure, coronary artery disease, abnormal heart rate or rhythm, heart attack, blood thinner therapy or heart weakness and/or failure Pulmonary or Respiratory History: No reported pulmonary signs or symptoms such as wheezing and difficulty taking a deep full breath (Asthma), difficulty blowing air out (Emphysema), coughing up mucus (Bronchitis), persistent dry cough, or temporary stoppage of breathing during sleep Neurological History: No reported neurological signs or symptoms such as seizures, abnormal skin sensations, urinary and/or fecal incontinence, being born with an abnormal open spine and/or a tethered spinal cord  Review of Past Neurological Studies:  Results for orders placed or performed during the hospital encounter of 08/25/14  CT Head Wo Contrast   Narrative   CLINICAL DATA:  Hypertension.  Headache and right arm numbness.  EXAM: CT HEAD WITHOUT CONTRAST  TECHNIQUE: Contiguous axial images were obtained from the base of the skull through the vertex without intravenous contrast.  COMPARISON:  05/18/2013  FINDINGS: The ventricles are normal in size and  configuration. No extra-axial fluid collections are identified. The gray-white differentiation is normal. No CT findings for acute intracranial process such as hemorrhage or infarction. No mass lesions. The brainstem and cerebellum are grossly normal.  The bony structures are intact. The paranasal sinuses and mastoid air cells are clear. The globes are intact.  IMPRESSION: No acute intracranial findings or mass lesions.   Electronically Signed   By: Kalman Jewels M.D.   On: 08/25/2014 16:34    Psychological-Psychiatric History: Depressed Gastrointestinal History: No reported gastrointestinal signs or symptoms such as vomiting or evacuating blood, reflux, heartburn, alternating episodes of diarrhea and constipation, inflamed or scarred liver, or pancreas or irrregular and/or infrequent bowel movements Genitourinary History: No reported renal or genitourinary signs or symptoms such as difficulty voiding or producing urine, peeing blood, non-functioning kidney, kidney stones, difficulty emptying the bladder, difficulty controlling the flow of urine, or chronic kidney disease Hematological History: Coagulation disorder Endocrine History: No reported endocrine signs or symptoms such as high or low blood sugar, rapid heart rate due to high thyroid levels, obesity or weight gain due to slow thyroid or thyroid disease Rheumatologic History: No reported rheumatological signs and symptoms such as fatigue, joint pain, tenderness, swelling, redness, heat, stiffness, decreased range of motion, with or without associated rash Musculoskeletal History: Negative for myasthenia gravis, muscular dystrophy, multiple sclerosis or malignant hyperthermia Work History: Disabled  Allergies  Ms. Amey has No Known Allergies.  Laboratory Chemistry  Inflammation Markers No results found for: CRP, ESRSEDRATE (CRP: Acute Phase) (ESR: Chronic Phase) Renal Function Markers Lab Results  Component Value Date    BUN 8 11/16/2017   CREATININE 0.70 11/16/2017   GFRAA >90 08/25/2014   GFRNONAA >90 08/25/2014   Hepatic Function Markers Lab Results  Component Value Date   AST 78 (H) 11/16/2017   ALT 81 (H) 11/16/2017   ALBUMIN 4.0 08/25/2014   ALKPHOS 64 08/25/2014   Electrolytes Lab Results  Component Value Date   NA 141 11/16/2017   K 3.3 (L) 11/16/2017   CL 100 11/16/2017   CALCIUM 9.8 11/16/2017   Neuropathy Markers Lab Results  Component Value Date   VITAMINB12 285 03/21/2011   Bone Pathology Markers Lab Results  Component Value Date   ALKPHOS 64 08/25/2014   CALCIUM 9.8 11/16/2017   Coagulation Parameters Lab Results  Component Value Date   PLT 192 11/16/2017   Cardiovascular Markers Lab Results  Component Value Date   HGB 14.5 11/16/2017   HCT 41.9 11/16/2017   Note: Lab results reviewed.  [place]  Drug: Ms. Bodi  reports that she does not use drugs. Alcohol:  reports that she does not drink alcohol. Tobacco:  reports that she has been smoking cigarettes.  She has a 6.00 pack-year smoking history. She has never used smokeless tobacco. Medical:  has a past medical history of Arthritis, Bell's palsy (may 2012), Chest pain, GERD (gastroesophageal reflux disease), H/O hiatal hernia, Hyperlipidemia, Hypertension, Hypothyroidism, Shingles, Shortness of breath, and Sleep apnea. Family: family history includes Diabetes in her brother and sister; Heart attack (  age of onset: 66) in her brother; Heart attack (age of onset: 49) in her sister; Heart attack (age of onset: 21) in her father; Stroke (age of onset: 37) in her brother; Stroke (age of onset: 14) in her sister.  Past Surgical History:  Procedure Laterality Date  . CESAREAN SECTION    . cesarian     3x  . PARTIAL HYSTERECTOMY  2000   abnormal uterine bleeding  . THYROIDECTOMY  1998  . TOOTH EXTRACTION    . TUBAL LIGATION     Active Ambulatory Problems    Diagnosis Date Noted  . Hypothyroidism 01/25/2010  .  HYPERCHOLESTEROLEMIA 12/31/2009  . TOBACCO ABUSE 05/24/2010  . DEPRESSION 12/31/2009  . HYPERTENSION, BENIGN ESSENTIAL 01/25/2010  . GERD 12/31/2009  . Chronic bilateral low back pain 01/25/2010  . Sleep apnea 12/31/2009  . Fatigue 03/21/2011  . 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  . Muscle spasms of neck 05/23/2011  . Nasal mucositis (ulcerative) 05/23/2011  . Numbness and tingling in right hand 08/25/2011  . 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 Strain of lumbar paraspinous muscle 12/26/2011  . Obesity (BMI 35.0-39.9 without comorbidity) 12/28/2011  . Constipation 01/11/2012  . Chest pain 01/11/2012  . Impaired glucose tolerance 02/14/2012  . Osteopathic Exam Findings (Somatic Dysfunction) 04/25/2012  . Chronic bilateral low back pain with bilateral sciatica  01/24/2018  . Chronic pain of both lower extremities  01/24/2018  . Chronic neck pain 01/24/2018  . Chronic upper extremity pain  01/24/2018  . Chronic pain syndrome 01/24/2018  . Long term current use of opiate analgesic 01/24/2018  . Pharmacologic therapy 01/24/2018  . Disorder of skeletal system 01/24/2018  . Problems influencing health status 01/24/2018   Resolved Ambulatory Problems    Diagnosis Date Noted  . SHINGLES 01/25/2010  . DERMATITIS, CHRONIC 05/24/2010  . ROTATOR CUFF SYNDROME, RIGHT 04/28/2010  . Bell's palsy 01/05/2011   Past Medical History:  Diagnosis Date  . Arthritis   . Bell's palsy may 2012  . Chest pain   . GERD (gastroesophageal reflux disease)   . H/O hiatal hernia   . Hyperlipidemia   . Hypertension   . Hypothyroidism   . Shingles   . Shortness of breath   . Sleep apnea    Constitutional Exam  General appearance: Well nourished, well developed, and well hydrated. In no apparent acute distress Vitals:   01/24/18 1302  BP: 121/82  Pulse: 76  Resp: 16  Temp: 98.2 F (36.8 C)  TempSrc: Oral  SpO2: 100%  Weight: 246 lb (111.6 kg)  Height: _0  (1.6 m)   BMI Assessment: Estimated  body mass index is 43.58 kg/m as calculated from the following:   Height as of this encounter: _1  (1.6 m).   Weight as of this encounter: 246 lb (111.6 kg).  BMI interpretation table: BMI level Category Range association with higher incidence of chronic pain  <18 kg/m2 Underweight   18.5-24.9 kg/m2 Ideal body weight   25-29.9 kg/m2 Overweight Increased incidence by 20%  30-34.9 kg/m2 Obese (Class I) Increased incidence by 68%  35-39.9 kg/m2 Severe obesity (Class II) Increased incidence by 136%  >40 kg/m2 Extreme obesity (Class III) Increased incidence by 254%   BMI Readings from Last 4 Encounters:  01/24/18 43.58 kg/m  01/09/18 46.46 kg/m  11/26/17 46.32 kg/m  11/16/17 45.86 kg/m   Wt Readings from Last 4 Encounters:  01/24/18 246 lb (111.6 kg)  01/09/18 254 lb (115.2 kg)  11/26/17 253 lb 4 oz (114.9 kg)  11/16/17 252 lb 12 oz (114.6 kg)  Psych/Mental status: Alert, oriented x 3 (person, place, & time)       Eyes: PERLA Respiratory: No evidence of acute respiratory distress  Cervical Spine Exam  Inspection: No masses, redness, or swelling Alignment: Symmetrical Functional ROM: Adequate ROM     painful with looking up and lateral rotation Stability: No instability detected Muscle strength & Tone: Functionally intact Sensory: Unimpaired Palpation: Area of depression at the base of neck  Cervical compression test      negative  Upper Extremity (UE) Exam    Side: Right upper extremity  Side: Left upper extremity  Inspection: No masses, redness, swelling, or asymmetry. No contractures  Inspection: No masses, redness, swelling, or asymmetry. No contractures  Functional ROM: Unrestricted ROM          Functional ROM: Unrestricted ROM          Muscle strength & Tone: Movement possible against gravity, but not against resistance (3/5)  Muscle strength & Tone: Movement possible against gravity, but not against resistance (3/5)  Sensory: Unimpaired  Sensory: Unimpaired   Palpation: No palpable anomalies              Palpation: No palpable anomalies              Specialized Test(s): Deferred         Specialized Test(s): Deferred          Thoracic Spine Exam  Inspection: No masses, redness, or swelling Alignment: Symmetrical Functional ROM: Unrestricted ROM Stability: No instability detected Sensory: Unimpaired Muscle strength & Tone: No palpable anomalies  Lumbar Spine Exam  Inspection: No masses, redness, or swelling Alignment: Symmetrical Functional ROM: Unrestricted ROM      Stability: No instability detected Muscle strength & Tone: Functionally intact Sensory: Unimpaired Palpation: Complains of area being tender to palpation       Provocative Tests: Lumbar Hyperextension and rotation test: Positive bilaterally for facet joint pain. Patrick's Maneuver: Negative                    Gait & Posture Assessment  Ambulation: Unassisted Gait: Awkward Posture: WNL   Lower Extremity Exam    Side: Right lower extremity  Side: Left lower extremity  Inspection: No masses, redness, swelling, or asymmetry. No contractures  Inspection: No masses, redness, swelling, or asymmetry. No contractures  Functional ROM: Unrestricted ROM          Functional ROM: Unrestricted ROM          Muscle strength & Tone: Mild-to-moderate deconditioning  Muscle strength & Tone: Mild-to-moderate deconditioning  Sensory: Unimpaired  Sensory: Unimpaired  Palpation: No palpable anomalies deformity of toes  Palpation: No palpable anomalies deformity of toes   Assessment  Primary Diagnosis & Pertinent Problem List: Diagnoses of Chronic bilateral low back pain with bilateral sciatica , Chronic pain of both lower extremities , Chronic neck pain, Chronic pain of both upper extremities, Chronic pain syndrome, Long term current use of opiate analgesic, Pharmacologic therapy, Disorder of skeletal system, and Problems influencing health status were pertinent to this visit.  Visit  Diagnosis: 1. Chronic bilateral low back pain with bilateral sciatica    2. Chronic pain of both lower extremities    3. Chronic neck pain   4. Chronic pain of both upper extremities   5. Chronic pain syndrome   6. Long term current use of opiate analgesic   7. Pharmacologic therapy   8. Disorder of skeletal system  9. Problems influencing health status    Plan of Care  Initial treatment plan:  Please be advised that as per protocol, today's visit has been an evaluation only. We have not taken over the patient's controlled substance management.  Problem-specific plan: No problem-specific Assessment & Plan notes found for this encounter.  Ordered Lab-work, Procedure(s), Referral(s), & Consult(s): Orders Placed This Encounter  Procedures  . DG Lumbar Spine Complete W/Bend  . DG Cervical Spine With Flex & Extend  . Compliance Drug Analysis, Ur  . Comp. Metabolic Panel (12)  . Magnesium  . Vitamin B12  . Sedimentation rate  . 25-Hydroxyvitamin D Lcms D2+D3  . C-reactive protein  . Ambulatory referral to Psychology   Pharmacotherapy: Medications ordered:  No orders of the defined types were placed in this encounter.  Medications administered during this visit: Kayline Sheer. Pellegrino had no medications administered during this visit.   Pharmacotherapy under consideration:  Opioid Analgesics: The patient was informed that there is no guarantee that she would be a candidate for opioid analgesics. The decision will be made following CDC guidelines. This decision will be based on the results of diagnostic studies, as well as Ms. Nickerson's risk profile.  Membrane stabilizer: To be determined at a later time Muscle relaxant: To be determined at a later time NSAID: To be determined at a later time Other analgesic(s): To be determined at a later time   Interventional therapies under consideration: Ms. Scardino was informed that there is no guarantee that she would be a candidate for  interventional therapies. The decision will be based on the results of diagnostic studies, as well as Ms. Degrave's risk profile.  Possible procedure(s): Diagnostic bilateral LESI Diagnostic bilateral lumbar facet nerve block Possible bilateral lumbar facet RFA Diagnostic bilateral CESI Diagnostic bilateral cervical facet nerve block Possible bilateral cervical facet RFA Diagnostic trigger point injection   Provider-requested follow-up: Return for 2nd Visit, w/ Dr. Dossie Arbour, after [facility] eval, medical record release.  No future appointments.  Primary Care Physician: Elby Beck, FNP Location: [facility] Note by:  Date: 01/24/2018; Time: 4:32 PM  Pain Score Disclaimer: We use the NRS-11 scale. This is a self-reported, subjective measurement of pain severity with only modest accuracy. It is used primarily to identify changes within a particular patient. It must be understood that outpatient pain scales are significantly less accurate that those used for research, where they can be applied under ideal controlled circumstances with minimal exposure to variables. In reality, the score is likely to be a combination of pain intensity and pain affect, where pain affect describes the degree of emotional arousal or changes in action readiness caused by the sensory experience of pain. Factors such as social and work situation, setting, emotional state, anxiety levels, expectation, and prior pain experience may influence pain perception and show large inter-individual differences that may also be affected by time variables.  Patient instructions provided during this appointment: Patient Instructions   __X-rays to be done. Referral to psychology. _________________________________________________________________________________________  Appointment Policy Summary  It is our goal and responsibility to provide the medical community with assistance in the evaluation  and management of patients with chronic pain. Unfortunately our resources are limited. Because we do not have an unlimited amount of time, or available appointments, we are required to closely monitor and manage their use. The following rules exist to maximize their use:  Patient's responsibilities: 1. Punctuality:  At what time should I arrive? You should be physically present in  our office 30 minutes before your scheduled appointment. Your scheduled appointment is with your assigned healthcare provider. However, it takes 5-10 minutes to be "checked-in", and another 15 minutes for the nurses to do the admission. If you arrive to our office at the time you were given for your appointment, you will end up being at least 20-25 minutes late to your appointment with the provider. 2. Tardiness:  What happens if I arrive only a few minutes after my scheduled appointment time? You will need to reschedule your appointment. The cutoff is your appointment time. This is why it is so important that you arrive at least 30 minutes before that appointment. If you have an appointment scheduled for 10:00 AM and you arrive at 10:01, you will be required to reschedule your appointment.  3. Plan ahead:  Always assume that you will encounter traffic on your way in. Plan for it. If you are dependent on a driver, make sure they understand these rules and the need to arrive early. 4. Other appointments and responsibilities:  Avoid scheduling any other appointments before or after your pain clinic appointments.  5. Be prepared:  Write down everything that you need to discuss with your healthcare provider and give this information to the admitting nurse. Write down the medications that you will need refilled. Bring your pills and bottles (even the empty ones), to all of your appointments, except for those where a procedure is scheduled. 6. No children or pets:  Find someone to take care of them. It is not appropriate to bring  them in. 7. Scheduling changes:  We request "advanced notification" of any changes or cancellations. 8. Advanced notification:  Defined as a time period of more than 24 hours prior to the originally scheduled appointment. This allows for the appointment to be offered to other patients. 9. Rescheduling:  When a visit is rescheduled, it will require the cancellation of the original appointment. For this reason they both fall within the category of "Cancellations".  10. Cancellations:  They require advanced notification. Any cancellation less than 24 hours before the  appointment will be recorded as a "No Show". 11. No Show:  Defined as an unkept appointment where the patient failed to notify or declare to the practice their intention or inability to keep the appointment.  Corrective process for repeat offenders:  1. Tardiness: Three (3) episodes of rescheduling due to late arrivals will be recorded as one (1) "No Show". 2. Cancellation or reschedule: Three (3) cancellations or rescheduling will be recorded as one (1) "No Show". 3. "No Shows": Three (3) "No Shows" within a 12 month period will result in discharge from the practice. ____________________________________________________________________________________________  ____________________________________________________________________________________________  Pain Scale  Introduction: The pain score used by this practice is the Verbal Numerical Rating Scale (VNRS-11). This is an 11-point scale. It is for adults and children 10 years or older. There are significant differences in how the pain score is reported, used, and applied. Forget everything you learned in the past and learn this scoring system.  General Information: The scale should reflect your current level of pain. Unless you are specifically asked for the level of your worst pain, or your average pain. If you are asked for one of these two, then it should be understood that it  is over the past 24 hours.  Basic Activities of Daily Living (ADL): Personal hygiene, dressing, eating, transferring, and using restroom.  Instructions: Most patients tend to report their level of pain as a combination  of two factors, their physical pain and their psychosocial pain. This last one is also known as "suffering" and it is reflection of how physical pain affects you socially and psychologically. From now on, report them separately. From this point on, when asked to report your pain level, report only your physical pain. Use the following table for reference.  Pain Clinic Pain Levels (0-5/10)  Pain Level Score  Description  No Pain 0   Mild pain 1 Nagging, annoying, but does not interfere with basic activities of daily living (ADL). Patients are able to eat, bathe, get dressed, toileting (being able to get on and off the toilet and perform personal hygiene functions), transfer (move in and out of bed or a chair without assistance), and maintain continence (able to control bladder and bowel functions). Blood pressure and heart rate are unaffected. A normal heart rate for a healthy adult ranges from 60 to 100 bpm (beats per minute).   Mild to moderate pain 2 Noticeable and distracting. Impossible to hide from other people. More frequent flare-ups. Still possible to adapt and function close to normal. It can be very annoying and may have occasional stronger flare-ups. With discipline, patients may get used to it and adapt.   Moderate pain 3 Interferes significantly with activities of daily living (ADL). It becomes difficult to feed, bathe, get dressed, get on and off the toilet or to perform personal hygiene functions. Difficult to get in and out of bed or a chair without assistance. Very distracting. With effort, it can be ignored when deeply involved in activities.   Moderately severe pain 4 Impossible to ignore for more than a few minutes. With effort, patients may still be able to manage  work or participate in some social activities. Very difficult to concentrate. Signs of autonomic nervous system discharge are evident: dilated pupils (mydriasis); mild sweating (diaphoresis); sleep interference. Heart rate becomes elevated (>115 bpm). Diastolic blood pressure (lower number) rises above 100 mmHg. Patients find relief in laying down and not moving.   Severe pain 5 Intense and extremely unpleasant. Associated with frowning face and frequent crying. Pain overwhelms the senses.  Ability to do any activity or maintain social relationships becomes significantly limited. Conversation becomes difficult. Pacing back and forth is common, as getting into a comfortable position is nearly impossible. Pain wakes you up from deep sleep. Physical signs will be obvious: pupillary dilation; increased sweating; goosebumps; brisk reflexes; cold, clammy hands and feet; nausea, vomiting or dry heaves; loss of appetite; significant sleep disturbance with inability to fall asleep or to remain asleep. When persistent, significant weight loss is observed due to the complete loss of appetite and sleep deprivation.  Blood pressure and heart rate becomes significantly elevated. Caution: If elevated blood pressure triggers a pounding headache associated with blurred vision, then the patient should immediately seek attention at an urgent or emergency care unit, as these may be signs of an impending stroke.    Emergency Department Pain Levels (6-10/10)  Emergency Room Pain 6 Severely limiting. Requires emergency care and should not be seen or managed at an outpatient pain management facility. Communication becomes difficult and requires great effort. Assistance to reach the emergency department may be required. Facial flushing and profuse sweating along with potentially dangerous increases in heart rate and blood pressure will be evident.   Distressing pain 7 Self-care is very difficult. Assistance is required to  transport, or use restroom. Assistance to reach the emergency department will be required. Tasks requiring coordination, such  as bathing and getting dressed become very difficult.   Disabling pain 8 Self-care is no longer possible. At this level, pain is disabling. The individual is unable to do even the most "basic" activities such as walking, eating, bathing, dressing, transferring to a bed, or toileting. Fine motor skills are lost. It is difficult to think clearly.   Incapacitating pain 9 Pain becomes incapacitating. Thought processing is no longer possible. Difficult to remember your own name. Control of movement and coordination are lost.   The worst pain imaginable 10 At this level, most patients pass out from pain. When this level is reached, collapse of the autonomic nervous system occurs, leading to a sudden drop in blood pressure and heart rate. This in turn results in a temporary and dramatic drop in blood flow to the brain, leading to a loss of consciousness. Fainting is one of the body's self defense mechanisms. Passing out puts the brain in a calmed state and causes it to shut down for a while, in order to begin the healing process.    Summary: 1. Refer to this scale when providing [place] with your pain level. 2. Be accurate and careful when reporting your pain level. This will help with your care. 3. Over-reporting your pain level will lead to loss of credibility. 4. Even a level of 1/10 means that there is pain and will be treated at our facility. 5. High, inaccurate reporting will be documented as "Symptom Exaggeration", leading to loss of credibility and suspicions of possible secondary gains such as obtaining more narcotics, or wanting to appear disabled, for fraudulent reasons. 6. Only pain levels of 5 or below will be seen at our facility. 7. Pain levels of 6 and above will be sent to the Emergency Department and the appointment  cancelled. ____________________________________________________________________________________________   BMI Assessment: Estimated body mass index is 43.58 kg/m as calculated from the following:   Height as of this encounter: _0  (1.6 m).   Weight as of this encounter: 246 lb (111.6 kg).  BMI interpretation table: BMI level Category Range association with higher incidence of chronic pain  <18 kg/m2 Underweight   18.5-24.9 kg/m2 Ideal body weight   25-29.9 kg/m2 Overweight Increased incidence by 20%  30-34.9 kg/m2 Obese (Class I) Increased incidence by 68%  35-39.9 kg/m2 Severe obesity (Class II) Increased incidence by 136%  >40 kg/m2 Extreme obesity (Class III) Increased incidence by 254%   BMI Readings from Last 4 Encounters:  01/24/18 43.58 kg/m  01/09/18 46.46 kg/m  11/26/17 46.32 kg/m  11/16/17 45.86 kg/m   Wt Readings from Last 4 Encounters:  01/24/18 246 lb (111.6 kg)  01/09/18 254 lb (115.2 kg)  11/26/17 253 lb 4 oz (114.9 kg)  11/16/17 252 lb 12 oz (114.6 kg)

## 2018-01-24 NOTE — Progress Notes (Signed)
Safety precautions to be maintained throughout the outpatient stay will include: orient to surroundings, keep bed in low position, maintain call bell within reach at all times, provide assistance with transfer out of bed and ambulation.  

## 2018-01-24 NOTE — Patient Instructions (Addendum)
__X-rays to be done. Referral to psychology. _________________________________________________________________________________________  Appointment Policy Summary  It is our goal and responsibility to provide the medical community with assistance in the evaluation and management of patients with chronic pain. Unfortunately our resources are limited. Because we do not have an unlimited amount of time, or available appointments, we are required to closely monitor and manage their use. The following rules exist to maximize their use:  Patient's responsibilities: 1. Punctuality:  At what time should I arrive? You should be physically present in our office 30 minutes before your scheduled appointment. Your scheduled appointment is with your assigned healthcare provider. However, it takes 5-10 minutes to be "checked-in", and another 15 minutes for the nurses to do the admission. If you arrive to our office at the time you were given for your appointment, you will end up being at least 20-25 minutes late to your appointment with the provider. 2. Tardiness:  What happens if I arrive only a few minutes after my scheduled appointment time? You will need to reschedule your appointment. The cutoff is your appointment time. This is why it is so important that you arrive at least 30 minutes before that appointment. If you have an appointment scheduled for 10:00 AM and you arrive at 10:01, you will be required to reschedule your appointment.  3. Plan ahead:  Always assume that you will encounter traffic on your way in. Plan for it. If you are dependent on a driver, make sure they understand these rules and the need to arrive early. 4. Other appointments and responsibilities:  Avoid scheduling any other appointments before or after your pain clinic appointments.  5. Be prepared:  Write down everything that you need to discuss with your healthcare provider and give this information to the admitting nurse. Write down  the medications that you will need refilled. Bring your pills and bottles (even the empty ones), to all of your appointments, except for those where a procedure is scheduled. 6. No children or pets:  Find someone to take care of them. It is not appropriate to bring them in. 7. Scheduling changes:  We request "advanced notification" of any changes or cancellations. 8. Advanced notification:  Defined as a time period of more than 24 hours prior to the originally scheduled appointment. This allows for the appointment to be offered to other patients. 9. Rescheduling:  When a visit is rescheduled, it will require the cancellation of the original appointment. For this reason they both fall within the category of "Cancellations".  10. Cancellations:  They require advanced notification. Any cancellation less than 24 hours before the  appointment will be recorded as a "No Show". 11. No Show:  Defined as an unkept appointment where the patient failed to notify or declare to the practice their intention or inability to keep the appointment.  Corrective process for repeat offenders:  1. Tardiness: Three (3) episodes of rescheduling due to late arrivals will be recorded as one (1) "No Show". 2. Cancellation or reschedule: Three (3) cancellations or rescheduling will be recorded as one (1) "No Show". 3. "No Shows": Three (3) "No Shows" within a 12 month period will result in discharge from the practice. ____________________________________________________________________________________________  ____________________________________________________________________________________________  Pain Scale  Introduction: The pain score used by this practice is the Verbal Numerical Rating Scale (VNRS-11). This is an 11-point scale. It is for adults and children 10 years or older. There are significant differences in how the pain score is reported, used, and applied. Forget everything  you learned in the past and  learn this scoring system.  General Information: The scale should reflect your current level of pain. Unless you are specifically asked for the level of your worst pain, or your average pain. If you are asked for one of these two, then it should be understood that it is over the past 24 hours.  Basic Activities of Daily Living (ADL): Personal hygiene, dressing, eating, transferring, and using restroom.  Instructions: Most patients tend to report their level of pain as a combination of two factors, their physical pain and their psychosocial pain. This last one is also known as "suffering" and it is reflection of how physical pain affects you socially and psychologically. From now on, report them separately. From this point on, when asked to report your pain level, report only your physical pain. Use the following table for reference.  Pain Clinic Pain Levels (0-5/10)  Pain Level Score  Description  No Pain 0   Mild pain 1 Nagging, annoying, but does not interfere with basic activities of daily living (ADL). Patients are able to eat, bathe, get dressed, toileting (being able to get on and off the toilet and perform personal hygiene functions), transfer (move in and out of bed or a chair without assistance), and maintain continence (able to control bladder and bowel functions). Blood pressure and heart rate are unaffected. A normal heart rate for a healthy adult ranges from 60 to 100 bpm (beats per minute).   Mild to moderate pain 2 Noticeable and distracting. Impossible to hide from other people. More frequent flare-ups. Still possible to adapt and function close to normal. It can be very annoying and may have occasional stronger flare-ups. With discipline, patients may get used to it and adapt.   Moderate pain 3 Interferes significantly with activities of daily living (ADL). It becomes difficult to feed, bathe, get dressed, get on and off the toilet or to perform personal hygiene functions. Difficult  to get in and out of bed or a chair without assistance. Very distracting. With effort, it can be ignored when deeply involved in activities.   Moderately severe pain 4 Impossible to ignore for more than a few minutes. With effort, patients may still be able to manage work or participate in some social activities. Very difficult to concentrate. Signs of autonomic nervous system discharge are evident: dilated pupils (mydriasis); mild sweating (diaphoresis); sleep interference. Heart rate becomes elevated (>115 bpm). Diastolic blood pressure (lower number) rises above 100 mmHg. Patients find relief in laying down and not moving.   Severe pain 5 Intense and extremely unpleasant. Associated with frowning face and frequent crying. Pain overwhelms the senses.  Ability to do any activity or maintain social relationships becomes significantly limited. Conversation becomes difficult. Pacing back and forth is common, as getting into a comfortable position is nearly impossible. Pain wakes you up from deep sleep. Physical signs will be obvious: pupillary dilation; increased sweating; goosebumps; brisk reflexes; cold, clammy hands and feet; nausea, vomiting or dry heaves; loss of appetite; significant sleep disturbance with inability to fall asleep or to remain asleep. When persistent, significant weight loss is observed due to the complete loss of appetite and sleep deprivation.  Blood pressure and heart rate becomes significantly elevated. Caution: If elevated blood pressure triggers a pounding headache associated with blurred vision, then the patient should immediately seek attention at an urgent or emergency care unit, as these may be signs of an impending stroke.    Emergency Department Pain Levels (  6-10/10)  Emergency Room Pain 6 Severely limiting. Requires emergency care and should not be seen or managed at an outpatient pain management facility. Communication becomes difficult and requires great effort.  Assistance to reach the emergency department may be required. Facial flushing and profuse sweating along with potentially dangerous increases in heart rate and blood pressure will be evident.   Distressing pain 7 Self-care is very difficult. Assistance is required to transport, or use restroom. Assistance to reach the emergency department will be required. Tasks requiring coordination, such as bathing and getting dressed become very difficult.   Disabling pain 8 Self-care is no longer possible. At this level, pain is disabling. The individual is unable to do even the most "basic" activities such as walking, eating, bathing, dressing, transferring to a bed, or toileting. Fine motor skills are lost. It is difficult to think clearly.   Incapacitating pain 9 Pain becomes incapacitating. Thought processing is no longer possible. Difficult to remember your own name. Control of movement and coordination are lost.   The worst pain imaginable 10 At this level, most patients pass out from pain. When this level is reached, collapse of the autonomic nervous system occurs, leading to a sudden drop in blood pressure and heart rate. This in turn results in a temporary and dramatic drop in blood flow to the brain, leading to a loss of consciousness. Fainting is one of the body's self defense mechanisms. Passing out puts the brain in a calmed state and causes it to shut down for a while, in order to begin the healing process.    Summary: 1. Refer to this scale when providing [place] with your pain level. 2. Be accurate and careful when reporting your pain level. This will help with your care. 3. Over-reporting your pain level will lead to loss of credibility. 4. Even a level of 1/10 means that there is pain and will be treated at our facility. 5. High, inaccurate reporting will be documented as "Symptom Exaggeration", leading to loss of credibility and suspicions of possible secondary gains such as obtaining more  narcotics, or wanting to appear disabled, for fraudulent reasons. 6. Only pain levels of 5 or below will be seen at our facility. 7. Pain levels of 6 and above will be sent to the Emergency Department and the appointment cancelled. ____________________________________________________________________________________________   BMI Assessment: Estimated body mass index is 43.58 kg/m as calculated from the following:   Height as of this encounter: 5\' 3"  (1.6 m).   Weight as of this encounter: 246 lb (111.6 kg).  BMI interpretation table: BMI level Category Range association with higher incidence of chronic pain  <18 kg/m2 Underweight   18.5-24.9 kg/m2 Ideal body weight   25-29.9 kg/m2 Overweight Increased incidence by 20%  30-34.9 kg/m2 Obese (Class I) Increased incidence by 68%  35-39.9 kg/m2 Severe obesity (Class II) Increased incidence by 136%  >40 kg/m2 Extreme obesity (Class III) Increased incidence by 254%   BMI Readings from Last 4 Encounters:  01/24/18 43.58 kg/m  01/09/18 46.46 kg/m  11/26/17 46.32 kg/m  11/16/17 45.86 kg/m   Wt Readings from Last 4 Encounters:  01/24/18 246 lb (111.6 kg)  01/09/18 254 lb (115.2 kg)  11/26/17 253 lb 4 oz (114.9 kg)  11/16/17 252 lb 12 oz (114.6 kg)

## 2018-01-28 LAB — COMP. METABOLIC PANEL (12)
A/G RATIO: 1.2 (ref 1.2–2.2)
ALK PHOS: 89 IU/L (ref 39–117)
AST: 94 IU/L — AB (ref 0–40)
Albumin: 4.4 g/dL (ref 3.5–5.5)
BUN/Creatinine Ratio: 8 — ABNORMAL LOW (ref 9–23)
BUN: 7 mg/dL (ref 6–24)
Bilirubin Total: 0.3 mg/dL (ref 0.0–1.2)
CHLORIDE: 95 mmol/L — AB (ref 96–106)
Calcium: 9.8 mg/dL (ref 8.7–10.2)
Creatinine, Ser: 0.86 mg/dL (ref 0.57–1.00)
GFR calc non Af Amer: 78 mL/min/{1.73_m2} (ref >59)
GFR, EST AFRICAN AMERICAN: 90 mL/min/{1.73_m2} (ref >59)
GLOBULIN, TOTAL: 3.6 g/dL (ref 1.5–4.5)
Glucose: 127 mg/dL — ABNORMAL HIGH (ref 65–99)
POTASSIUM: 3.3 mmol/L — AB (ref 3.5–5.2)
Sodium: 143 mmol/L (ref 134–144)
Total Protein: 8 g/dL (ref 6.0–8.5)

## 2018-01-28 LAB — MAGNESIUM: Magnesium: 2 mg/dL (ref 1.6–2.3)

## 2018-01-28 LAB — VITAMIN B12: VITAMIN B 12: 356 pg/mL (ref 232–1245)

## 2018-01-28 LAB — C-REACTIVE PROTEIN: CRP: 5.5 mg/L — AB (ref 0.0–4.9)

## 2018-01-28 LAB — 25-HYDROXY VITAMIN D LCMS D2+D3
25-Hydroxy, Vitamin D-2: <1 ng/mL
25-Hydroxy, Vitamin D-3: 9.3 ng/mL
25-Hydroxy, Vitamin D: 9.3 ng/mL — ABNORMAL LOW

## 2018-01-28 LAB — SEDIMENTATION RATE: SED RATE: 40 mm/h (ref 0–40)

## 2018-01-31 LAB — COMPLIANCE DRUG ANALYSIS, UR

## 2018-02-07 ENCOUNTER — Telehealth: Payer: Self-pay

## 2018-02-07 NOTE — Telephone Encounter (Signed)
Copied from [place] [phone]. Topic: General - Other >> Feb 07, 2018  2:35 PM Gardiner Ramus wrote: Reason for CRM: pt called and said that her son currently has shingles and pt wants to know if she needs to get the shingles vaccine

## 2018-02-08 NOTE — Telephone Encounter (Signed)
She should get the shingles vaccine since she is over 50. She can check with her pharmacy. We don't have any in the office. Getting the vaccine will not help her with exposure now but in future. She needs to avoid contact with her son's skin until his shingles is dried up.

## 2018-02-08 NOTE — Telephone Encounter (Signed)
Called and spoke with patient informing her of recommendations. Understanding verbalized nothing further needed at this time.  

## 2018-02-26 NOTE — Progress Notes (Signed)
Patient's Name: Teresa Mcgee  MRN: [id number]  Referring Provider: Elby Beck, FNP  DOB: 1966/03/18  PCP: Elby Beck, FNP  DOS: 02/27/2018  Note by: Gaspar Cola, MD  Service setting: Ambulatory outpatient  Specialty: Interventional Pain Management  Location: [facility] (AMB) Pain Management Facility    Patient type: Established   Primary Reason(s) for Visit: Encounter for evaluation before starting new chronic pain management plan of care (Level of risk: moderate) CC: Back Pain  HPI  Teresa Mcgee is a 52 y.o. year old, female patient, who comes today for a follow-up evaluation to review the test results and decide on a treatment plan. She has Hypothyroidism; HYPERCHOLESTEROLEMIA; TOBACCO ABUSE; DEPRESSION; HYPERTENSION, BENIGN ESSENTIAL; GERD; Sleep apnea; Fatigue; Nasal septal perforation; Muscle spasms of neck; Nasal mucositis (ulcerative); Numbness and tingling in right hand; Xerotic eczema; Strain of lumbar paraspinous muscle; Obesity (BMI 35.0-39.9 without comorbidity); Constipation; Chest pain; Impaired glucose tolerance; Osteopathic Exam Findings (Somatic Dysfunction); Chronic bilateral low back pain with bilateral sciatica ( Primary Area of  Pain) (L>R); Chronic pain of both lower extremities  ( Secondary  Area of  Pain) (L>R); Chronic neck pain   (Tertiary Area of Pain) (L>R); Chronic pain syndrome; Long term current use of opiate analgesic; Pharmacologic therapy; Disorder of skeletal system; Problems influencing health status; Chronic upper extremity pain (Fourth Area of Pain) (Bilateral) (L>R); and Vitamin D deficiency on their problem list. Her primarily concern today is the Back Pain  Pain Assessment: Location: Lower Back Radiating: Pain radiaties down right leg down to foot Onset: More than a month ago Duration: Chronic pain Quality: Aching, Shooting, Nagging, Pressure, Heaviness Severity: 6 /10 (subjective, self-reported pain score)  Note: Reported level is  inconsistent with clinical observations. Clinically the patient looks like a 3/10 A 3/10 is viewed as "Moderate" and described as significantly interfering with activities of daily living (ADL). It becomes difficult to feed, bathe, get dressed, get on and off the toilet or to perform personal hygiene functions. Difficult to get in and out of bed or a chair without assistance. Very distracting. With effort, it can be ignored when deeply involved in activities.       When using our objective Pain Scale, levels between 6 and 10/10 are said to belong in an emergency room, as it progressively worsens from a 6/10, described as severely limiting, requiring emergency care not usually available at an outpatient pain management facility. At a 6/10 level, communication becomes difficult and requires great effort. Assistance to reach the emergency department may be required. Facial flushing and profuse sweating along with potentially dangerous increases in heart rate and blood pressure will be evident. Effect on ADL: no prolong standing or walking, difficult do household chores Timing: Constant Modifying factors: medications and rsting BP: 128/72  HR: 78  Teresa Mcgee comes in today for a follow-up visit after her initial evaluation on 01/24/2018. Today we went over the results of her tests. These were explained in "Layman's terms". During today's appointment we went over my diagnostic impression, as well as the proposed treatment plan.  According to the patient her primary area pain is in her lower back.  States that she suffered a fall around 2006 while on the job.  He admits that the left side is greater than the right (L>R).  She describes it as a pulling type pain.  She denies any previous surgery.  She has had previous epidural steroid injections by Dr. Dossie Arbour in the past.  She states they  were not effective.  She admits that she was told that surgery was not an option she did complete physical therapy in 2011 (4-5  sessions).  She admits that this was not effective and may have aggravated the pain.  She denies any recent images.  Second area of pain is in her lower extremities.  She admits the pain goes down back of the leg. She admits that she has occasional numbness and tingling that goes all the way down into her feet.  She admits that she has weakness but does not use assistive devices.  She admits that she did have a nerve conduction study in [place].  Her third area pain is in her neck.  She admits the left side is greater than the right (L>R).  She denies any previous surgery or interventional therapy.  She admits that physical therapy in 2011 was also inclusive of her neck.    Her fourth area of pain is in her upper extremities.  She admits that the left arm is greater than the right (L>R).  She has numbness and tingling that goes down her arm and affects all of her fingers.  She denies any previous surgery, interventional therapy for her arms.   She indicates that she is here because she need pain medicine and Dr. Glade Lloyd, her prior physician, lost her license. She describes not being interested in any injections, shots, nerve blocks or any other type of interventional therapies.  In considering the treatment plan options, Teresa Mcgee was reminded that I no longer take patients for medication management only. I asked her to let me know if she had no intention of taking advantage of the interventional therapies, so that we could make arrangements to provide this space to someone interested. I also made it clear that undergoing interventional therapies for the purpose of getting pain medications is very inappropriate on the part of a patient, and it will not be tolerated in this practice. This type of behavior would suggest true addiction and therefore it requires referral to an addiction specialist.   Further details on both, my assessment(s), as well as the proposed treatment plan, please see  below.  Controlled Substance Pharmacotherapy Assessment REMS (Risk Evaluation and Mitigation Strategy)  Analgesic: None Highest recorded MME/day: 60 mg/day MME/day: 0 mg/day Pill Count: None expected due to no prior prescriptions written by our practice. No notes on file Pharmacokinetics: Liberation and absorption (onset of action): WNL Distribution (time to peak effect): WNL Metabolism and excretion (duration of action): WNL         Pharmacodynamics: Desired effects: Analgesia: Teresa Mcgee reports >50% benefit. Functional ability: Patient reports that medication allows her to accomplish basic ADLs Clinically meaningful improvement in function (CMIF): Sustained CMIF goals met Perceived effectiveness: Described as relatively effective, allowing for increase in activities of daily living (ADL) Undesirable effects: Side-effects or Adverse reactions: None reported Monitoring: [place] PMP: Online review of the past 61-monthperiod previously conducted. Not applicable at this point since we have not taken over the patient's medication management yet. List of other Serum/Urine Drug Screening Test(s):  Lab Results  Component Value Date   COCAINSCRNUR NONE DETECTED 08/25/2014   THCU NONE DETECTED 08/25/2014   List of all UDS test(s) done:  Lab Results  Component Value Date   SUMMARY FINAL 01/24/2018   Last UDS on record: Summary  Date Value Ref Range Status  01/24/2018 FINAL  Final    Comment:    ==================================================================== TOXASSURE COMP DRUG ANALYSIS,UR ==================================================================== Test  Result       Flag       Units Drug Present and Declared for Prescription Verification   Gabapentin                     PRESENT      EXPECTED   Naproxen                       PRESENT      EXPECTED ==================================================================== Test                       Result    Flag   Units      Ref Range   Creatinine              145              mg/dL      >=20 ==================================================================== Declared Medications:  The flagging and interpretation on this report are based on the  following declared medications.  Unexpected results may arise from  inaccuracies in the declared medications.  **Note: The testing scope of this panel includes these medications:  Gabapentin  Naproxen  **Note: The testing scope of this panel does not include following  reported medications:  Amlodipine (Norvasc)  Cetirizine (Zyrtec)  Eye Drops  Fluticasone (Flonase)  Hydrochlorothiazide  Ketotifen (Zaditor)  Levothyroxine ==================================================================== For clinical consultation, please call [phone]. ====================================================================    UDS interpretation: No unexpected findings.          Medication Assessment Form: Not applicable. Treatment compliance: Not applicable Risk Assessment Profile: Aberrant behavior: claims that "nothing else works" Comorbid factors increasing risk of overdose: age 67-56 years old, caucasian, liver disease and sleep apnea Medical Psychology Evaluation: Low Risk Opioid Risk Tool - 01/24/18 1313      Family History of Substance Abuse   Alcohol  Positive Female    Illegal Drugs  Negative    Rx Drugs  Negative      Personal History of Substance Abuse   Alcohol  Negative    Illegal Drugs  Negative    Rx Drugs  Negative      Age   Age between 55-45 years   No      History of Preadolescent Sexual Abuse   History of Preadolescent Sexual Abuse  Negative or Female      Psychological Disease   Psychological Disease  Negative    Depression  Negative      Total Score   Opioid Risk Tool Scoring  1    Opioid Risk Interpretation  Low Risk      ORT Scoring interpretation table:  Score <3 = Low Risk for SUD  Score between  4-7 = Moderate Risk for SUD  Score >8 = High Risk for Opioid Abuse   Risk Mitigation Strategies:  Patient opioid safety counseling: Completed today. Patient-Prescriber Agreement (PPA): No agreement signed.  Controlled substance notification to other providers: None required. No opioid therapy.  Pharmacologic Plan: No objective evidence of pathology justifying the use of opioid analgesics has been confirmed. Therefore, at this time, I cannot recommend the use of these controlled substances. At this point we do not have the necessary resources to take on her case for medication management only.  Laboratory Chemistry  Inflammation Markers (CRP: Acute Phase) (ESR: Chronic Phase) Lab Results  Component Value Date   CRP 5.5 (H) 01/24/2018   ESRSEDRATE 40 01/24/2018  NOTE: Elevated CRP is usually  seen with acute phase inflammatory disease.  Rheumatology Markers No results found.  Renal Function Markers Lab Results  Component Value Date   BUN 7 01/24/2018   CREATININE 0.86 01/24/2018   BCR 8 (L) 01/24/2018   GFRAA 90 01/24/2018   GFRNONAA 78 01/24/2018                             Hepatic Function Markers Lab Results  Component Value Date   AST 94 (H) 01/24/2018   ALT 81 (H) 11/16/2017   ALBUMIN 4.4 01/24/2018   ALKPHOS 89 01/24/2018                        Electrolytes Lab Results  Component Value Date   NA 143 01/24/2018   K 3.3 (L) 01/24/2018   CL 95 (L) 01/24/2018   CALCIUM 9.8 01/24/2018   MG 2.0 01/24/2018                        Neuropathy Markers Lab Results  Component Value Date   VITAMINB12 356 01/24/2018   HGBA1C 6.5 (H) 11/16/2017   HIV NON REACTIVE 03/21/2011                        Bone Pathology Markers Lab Results  Component Value Date   25OHVITD1 9.3 (L) 01/24/2018   25OHVITD2 <1.0 01/24/2018   25OHVITD3 9.3 01/24/2018                         Coagulation Parameters Lab Results  Component Value Date   PLT 192 11/16/2017   DDIMER 0.69 (H)  01/11/2012                        Cardiovascular Markers Lab Results  Component Value Date   CKTOTAL 586 (H) 01/11/2012   CKMB 5.2 (H) 01/11/2012   TROPONINI <0.30 01/11/2012   HGB 14.5 11/16/2017   HCT 41.9 11/16/2017                         CA Markers No results found.  Note: Lab results reviewed and explained to patient in Layman's terms.  Recent Diagnostic Imaging Review  Cervical Imaging: Cervical MR wo contrast:  Results for orders placed in visit on 04/13/11  MR Cervical Spine Wo Contrast   Cervical CT wo contrast:  Results for orders placed during the hospital encounter of 05/18/13  CT Cervical Spine Wo Contrast   Narrative *RADIOLOGY REPORT*  Clinical Data:  Arm pain, headache.  CT HEAD WITHOUT CONTRAST CT CERVICAL SPINE WITHOUT CONTRAST  Technique:  Multidetector CT imaging of the head and cervical spine was performed following the standard protocol without intravenous contrast.  Multiplanar CT image reconstructions of the cervical spine were also generated.  Comparison:  None available at time of study interpretation.  CT HEAD  Findings: The ventricles and sulci are normal.  No intraparenchymal hemorrhage, mass effect nor midline shift.  No acute large vascular territory infarcts.  No abnormal extra-axial fluid collections.  Basal cisterns are patent.  No skull fracture.  Visualized paranasal sinuses and mastoid aircells are well-aerated.  The included ocular globes and orbital contents are non-suspicious. The patient is edentulous.  IMPRESSION: No acute intracranial process.  Normal noncontrast CT of the head for age.  CT  CERVICAL SPINE  Findings: Cervical vertebrae appear intact and aligned with straightened cervical lordosis. Intervertebral disc heights generally preserved. Mild ventral endplate spurring at B1-4.  No destructive bony lesions.  Included prevertebral paraspinal soft tissues are non suspicious.  Minimal calcific  atherosclerosis of the carotid bulbs.  Slight effacement left piriformis sinus may reflect be secondary to secretions.  Limited assessment for disc bulge due to the larger body habitus. No definite canal stenosis or neural foraminal narrowing.  IMPRESSION: No acute cervical spine fracture or malalignment.  Straightened cervical lordosis.   Original Report Authenticated By: Elon Alas    Lumbosacral Imaging: Lumbar DG (Complete) 4+V:  Results for orders placed during the hospital encounter of 03/03/14  DG Lumbar Spine Complete   Narrative CLINICAL DATA:  Lower back pain post fall  EXAM: LUMBAR SPINE - COMPLETE 4+ VIEW  COMPARISON:  10/10/2010  FINDINGS: Five views of lumbar spine submitted. No acute fracture or subluxation. Again noted mild degenerative changes and T11-T12 level.  IMPRESSION: No acute fracture or subluxation. Mild degenerative changes lower thoracic spine.   Electronically Signed   By: Lahoma Crocker M.D.   On: 03/03/2014 20:58    Knee Imaging: Knee-R DG 4 views:  Results for orders placed during the hospital encounter of 01/27/14  DG Knee Complete 4 Views Right   Narrative CLINICAL DATA:  Pain  EXAM: RIGHT KNEE - COMPLETE 4+ VIEW  COMPARISON:  None.  FINDINGS: Frontal, lateral, and bilateral oblique views were obtained. There is no fracture, dislocation, or effusion. Joint spaces appear intact. No erosive change.  IMPRESSION: No fracture or effusion.  No appreciable arthropathy.   Electronically Signed   By: Lowella Grip M.D.   On: 01/27/2014 08:47    Complexity Note: Imaging results reviewed. Results shared with Teresa Mcgee, using Layman's terms.                         Meds   Current Outpatient Medications:  .  amLODipine (NORVASC) 10 MG tablet, Take 1 tablet (10 mg total) by mouth daily., Disp: 90 tablet, Rfl: 3 .  carboxymethylcellulose 1 % ophthalmic solution, Apply 1 drop to eye 3 (three) times daily., Disp: 30 mL,  Rfl: 12 .  cetirizine (ZYRTEC) 10 MG tablet, Take 1 tablet (10 mg total) by mouth daily., Disp: 90 tablet, Rfl: 3 .  fluticasone (FLONASE) 50 MCG/ACT nasal spray, Place 2 sprays into both nostrils daily., Disp: 16 g, Rfl: 6 .  gabapentin (NEURONTIN) 100 MG capsule, TAKE 1 CAPSULE AT BEDTIME X 5 DAYS THEN INCREASE TO 2 CAPSULES AT BEDTIME, Disp: 180 capsule, Rfl: 0 .  hydrochlorothiazide (HYDRODIURIL) 25 MG tablet, Take 1 tablet (25 mg total) by mouth daily. as directed, Disp: 90 tablet, Rfl: 3 .  ketotifen (ZADITOR) 0.025 % ophthalmic solution, Place 1 drop into both eyes 2 (two) times daily., Disp: 5 mL, Rfl: 0 .  levothyroxine (SYNTHROID, LEVOTHROID) 100 MCG tablet, TAKE 1 TABLET BY MOUTH EVERY DAY, Disp: 90 tablet, Rfl: 1 .  naproxen (NAPROSYN) 500 MG tablet, Take 500 mg by mouth 2 (two) times daily with a meal., Disp: , Rfl:  .  REFRESH CELLUVISC 1 % GEL, APPLY 1 DROP TO EYE 3 (THREE) TIMES DAILY., Disp: , Rfl: 12 .  Calcium Carbonate-Vit D-Min (GNP CALCIUM 1200) 1200-1000 MG-UNIT CHEW, Chew 1,200 mg by mouth daily with breakfast. Take in combination with vitamin D and magnesium., Disp: 30 tablet, Rfl: 5 .  Cholecalciferol (VITAMIN D3) 5000  units CAPS, Take 1 capsule (5,000 Units total) by mouth daily with breakfast. Take along with calcium and magnesium., Disp: 30 capsule, Rfl: 5 .  ergocalciferol (VITAMIN D2) 50000 units capsule, Take 1 capsule (50,000 Units total) by mouth 2 (two) times a week. X 6 weeks., Disp: 12 capsule, Rfl: 0 .  Magnesium 500 MG CAPS, Take 1 capsule (500 mg total) by mouth 2 (two) times daily at 8 am and 10 pm., Disp: 60 capsule, Rfl: 5  ROS  Constitutional: Denies any fever or chills Gastrointestinal: No reported hemesis, hematochezia, vomiting, or acute GI distress Musculoskeletal: Denies any acute onset joint swelling, redness, loss of ROM, or weakness Neurological: No reported episodes of acute onset apraxia, aphasia, dysarthria, agnosia, amnesia, paralysis, loss of  coordination, or loss of consciousness  Allergies  Teresa Mcgee has No Known Allergies.  Dante  Drug: Teresa Mcgee  reports that she does not use drugs. Alcohol:  reports that she does not drink alcohol. Tobacco:  reports that she has been smoking cigarettes.  She has a 6.00 pack-year smoking history. She has never used smokeless tobacco. Medical:  has a past medical history of Arthritis, Bell's palsy (may 2012), Chest pain, GERD (gastroesophageal reflux disease), H/O hiatal hernia, Hyperlipidemia, Hypertension, Hypothyroidism, Shingles, Shortness of breath, and Sleep apnea. Surgical: Teresa Mcgee  has a past surgical history that includes Thyroidectomy (1998); cesarian; Partial hysterectomy (2000); Cesarean section; Tubal ligation; and Tooth extraction. Family: family history includes Diabetes in her brother and sister; Heart attack (age of onset: 70) in her brother; Heart attack (age of onset: 87) in her sister; Heart attack (age of onset: 60) in her father; Stroke (age of onset: 40) in her brother; Stroke (age of onset: 22) in her sister.  Constitutional Exam  General appearance: Well nourished, well developed, and well hydrated. In no apparent acute distress Vitals:   02/27/18 1049  BP: 128/72  Pulse: 78  Temp: 97.8 F (36.6 C)  SpO2: 100%  Weight: 246 lb (111.6 kg)  Height: '5\' 3"'  (1.6 m)   BMI Assessment: Estimated body mass index is 43.58 kg/m as calculated from the following:   Height as of this encounter: '5\' 3"'  (1.6 m).   Weight as of this encounter: 246 lb (111.6 kg).  BMI interpretation table: BMI level Category Range association with higher incidence of chronic pain  <18 kg/m2 Underweight   18.5-24.9 kg/m2 Ideal body weight   25-29.9 kg/m2 Overweight Increased incidence by 20%  30-34.9 kg/m2 Obese (Class I) Increased incidence by 68%  35-39.9 kg/m2 Severe obesity (Class II) Increased incidence by 136%  >40 kg/m2 Extreme obesity (Class III) Increased incidence by 254%    Patient's current BMI Ideal Body weight  Body mass index is 43.58 kg/m. Ideal body weight: 52.4 kg (115 lb 8.3 oz) Adjusted ideal body weight: 76.1 kg (167 lb 11.4 oz)   BMI Readings from Last 4 Encounters:  02/27/18 43.58 kg/m  01/24/18 43.58 kg/m  01/09/18 46.46 kg/m  11/26/17 46.32 kg/m   Wt Readings from Last 4 Encounters:  02/27/18 246 lb (111.6 kg)  01/24/18 246 lb (111.6 kg)  01/09/18 254 lb (115.2 kg)  11/26/17 253 lb 4 oz (114.9 kg)  Psych/Mental status: Alert, oriented x 3 (person, place, & time)       Eyes: PERLA Respiratory: No evidence of acute respiratory distress  Cervical Spine Area Exam  Skin & Axial Inspection: No masses, redness, edema, swelling, or associated skin lesions Alignment: Symmetrical Functional ROM: Unrestricted ROM  Stability: No instability detected Muscle Tone/Strength: Functionally intact. No obvious neuro-muscular anomalies detected. Sensory (Neurological): Unimpaired Palpation: No palpable anomalies              Upper Extremity (UE) Exam    Side: Right upper extremity  Side: Left upper extremity  Skin & Extremity Inspection: Skin color, temperature, and hair growth are WNL. No peripheral edema or cyanosis. No masses, redness, swelling, asymmetry, or associated skin lesions. No contractures.  Skin & Extremity Inspection: Skin color, temperature, and hair growth are WNL. No peripheral edema or cyanosis. No masses, redness, swelling, asymmetry, or associated skin lesions. No contractures.  Functional ROM: Unrestricted ROM          Functional ROM: Unrestricted ROM          Muscle Tone/Strength: Functionally intact. No obvious neuro-muscular anomalies detected.  Muscle Tone/Strength: Functionally intact. No obvious neuro-muscular anomalies detected.  Sensory (Neurological): Unimpaired          Sensory (Neurological): Unimpaired          Palpation: No palpable anomalies              Palpation: No palpable anomalies               Provocative Test(s):  Phalen's test: deferred Tinel's test: deferred Apley's scratch test (touch opposite shoulder):  Action 1 (Across chest): deferred Action 2 (Overhead): deferred Action 3 (LB reach): deferred   Provocative Test(s):  Phalen's test: deferred Tinel's test: deferred Apley's scratch test (touch opposite shoulder):  Action 1 (Across chest): deferred Action 2 (Overhead): deferred Action 3 (LB reach): deferred    Thoracic Spine Area Exam  Skin & Axial Inspection: No masses, redness, or swelling Alignment: Symmetrical Functional ROM: Unrestricted ROM Stability: No instability detected Muscle Tone/Strength: Functionally intact. No obvious neuro-muscular anomalies detected. Sensory (Neurological): Unimpaired Muscle strength & Tone: No palpable anomalies  Lumbar Spine Area Exam  Skin & Axial Inspection: No masses, redness, or swelling Alignment: Symmetrical Functional ROM: Diminished ROM       Stability: No instability detected Muscle Tone/Strength: Functionally intact. No obvious neuro-muscular anomalies detected. Sensory (Neurological): Movement-associated discomfort Palpation: No palpable anomalies       Provocative Tests: Lumbar Hyperextension/rotation test: deferred today       Lumbar quadrant test (Kemp's test): deferred today       Lumbar Lateral bending test: deferred today       Patrick's Maneuver: deferred today                   FABER test: deferred today                   Thigh-thrust test: deferred today       S-I compression test: deferred today       S-I distraction test: deferred today        Gait & Posture Assessment  Ambulation: Unassisted Gait: Relatively normal for age and body habitus Posture: WNL   Lower Extremity Exam    Side: Right lower extremity  Side: Left lower extremity  Stability: No instability observed          Stability: No instability observed          Skin & Extremity Inspection: Skin color, temperature, and hair growth  are WNL. No peripheral edema or cyanosis. No masses, redness, swelling, asymmetry, or associated skin lesions. No contractures.  Skin & Extremity Inspection: Skin color, temperature, and hair growth are WNL. No peripheral edema or cyanosis. No masses,  redness, swelling, asymmetry, or associated skin lesions. No contractures.  Functional ROM: Unrestricted ROM                  Functional ROM: Unrestricted ROM                  Muscle Tone/Strength: Functionally intact. No obvious neuro-muscular anomalies detected.  Muscle Tone/Strength: Functionally intact. No obvious neuro-muscular anomalies detected.  Sensory (Neurological): Unimpaired  Sensory (Neurological): Unimpaired  Palpation: No palpable anomalies  Palpation: No palpable anomalies   Assessment & Plan  Primary Diagnosis & Pertinent Problem List: The primary encounter diagnosis was Chronic bilateral low back pain with bilateral sciatica ( Primary Area of  Pain) (L>R). Diagnoses of Chronic pain of both lower extremities  ( Secondary  Area of  Pain) (L>R), Chronic neck pain   (Tertiary Area of Pain) (L>R), Chronic pain of both upper extremities, Chronic pain syndrome, and Vitamin D deficiency were also pertinent to this visit.  Visit Diagnosis: 1. Chronic bilateral low back pain with bilateral sciatica ( Primary Area of  Pain) (L>R)   2. Chronic pain of both lower extremities  ( Secondary  Area of  Pain) (L>R)   3. Chronic neck pain   (Tertiary Area of Pain) (L>R)   4. Chronic pain of both upper extremities   5. Chronic pain syndrome   6. Vitamin D deficiency    Problems updated and reviewed during this visit: Problem  Pharmacologic Therapy  Problems Influencing Health Status    Plan of Care  Pharmacotherapy (Medications Ordered): Meds ordered this encounter  Medications  . ergocalciferol (VITAMIN D2) 50000 units capsule    Sig: Take 1 capsule (50,000 Units total) by mouth 2 (two) times a week. X 6 weeks.    Dispense:  12 capsule     Refill:  0    Do not add this medication to the electronic "Automatic Refill" notification system. Patient may have prescription filled one day early if pharmacy is closed on scheduled refill date.  . Cholecalciferol (VITAMIN D3) 5000 units CAPS    Sig: Take 1 capsule (5,000 Units total) by mouth daily with breakfast. Take along with calcium and magnesium.    Dispense:  30 capsule    Refill:  5    Do not place medication on "Automatic Refill".  May substitute with similar over-the-counter product.  . Magnesium 500 MG CAPS    Sig: Take 1 capsule (500 mg total) by mouth 2 (two) times daily at 8 am and 10 pm.    Dispense:  60 capsule    Refill:  5    Do not place medication on "Automatic Refill".  The patient may use similar over-the-counter product.  . Calcium Carbonate-Vit D-Min (GNP CALCIUM 1200) 1200-1000 MG-UNIT CHEW    Sig: Chew 1,200 mg by mouth daily with breakfast. Take in combination with vitamin D and magnesium.    Dispense:  30 tablet    Refill:  5    Do not place medication on "Automatic Refill".  May substitute with similar over-the-counter product.   Procedure Orders    No procedure(s) ordered today   Lab Orders  No laboratory test(s) ordered today   Imaging Orders  No imaging studies ordered today   Referral Orders  No referral(s) requested today    Pharmacological management options:  Opioid Analgesics: I will not be prescribing any opioids at this time Membrane stabilizer: None prescribed at this time Muscle relaxant: None prescribed at this time NSAID: None  prescribed at this time Other analgesic(s): None prescribed at this time   Interventional management options: Planned, scheduled, and/or pending:    No return appointment. Not interested in interventional therapies.   Considered:   Diagnostic bilateral LESI  Diagnostic bilateral lumbar facet nerve block  Possible bilateral lumbar facet RFA  Diagnostic bilateral CESI  Diagnostic bilateral cervical  facet nerve block  Possible bilateral cervical facet RFA  Diagnostic trigger point injection    PRN Procedures:   None at this time   Provider-requested follow-up: No follow-ups on file.  No future appointments.  Primary Care Physician: Elby Beck, FNP Location: [facility] Note by: Gaspar Cola, MD Date: 02/27/2018; Time: 11:37 AM

## 2018-02-27 ENCOUNTER — Encounter: Payer: Self-pay | Admitting: Pain Medicine

## 2018-02-27 ENCOUNTER — Other Ambulatory Visit: Payer: Self-pay | Admitting: Pain Medicine

## 2018-02-27 ENCOUNTER — Other Ambulatory Visit: Payer: Self-pay

## 2018-02-27 ENCOUNTER — Ambulatory Visit: Payer: Medicare HMO | Attending: Pain Medicine | Admitting: Pain Medicine

## 2018-02-27 VITALS — BP 128/72 | HR 78 | Temp 97.8°F | Ht 63.0 in | Wt 246.0 lb

## 2018-02-27 DIAGNOSIS — Z79891 Long term (current) use of opiate analgesic: Secondary | ICD-10-CM | POA: Insufficient documentation

## 2018-02-27 DIAGNOSIS — E785 Hyperlipidemia, unspecified: Secondary | ICD-10-CM | POA: Insufficient documentation

## 2018-02-27 DIAGNOSIS — Z79890 Hormone replacement therapy (postmenopausal): Secondary | ICD-10-CM | POA: Insufficient documentation

## 2018-02-27 DIAGNOSIS — M5441 Lumbago with sciatica, right side: Secondary | ICD-10-CM

## 2018-02-27 DIAGNOSIS — M542 Cervicalgia: Secondary | ICD-10-CM | POA: Insufficient documentation

## 2018-02-27 DIAGNOSIS — E559 Vitamin D deficiency, unspecified: Secondary | ICD-10-CM

## 2018-02-27 DIAGNOSIS — Z79899 Other long term (current) drug therapy: Secondary | ICD-10-CM | POA: Diagnosis not present

## 2018-02-27 DIAGNOSIS — Z90710 Acquired absence of both cervix and uterus: Secondary | ICD-10-CM | POA: Diagnosis not present

## 2018-02-27 DIAGNOSIS — M79601 Pain in right arm: Secondary | ICD-10-CM

## 2018-02-27 DIAGNOSIS — Z9889 Other specified postprocedural states: Secondary | ICD-10-CM | POA: Insufficient documentation

## 2018-02-27 DIAGNOSIS — Z9851 Tubal ligation status: Secondary | ICD-10-CM | POA: Diagnosis not present

## 2018-02-27 DIAGNOSIS — G894 Chronic pain syndrome: Secondary | ICD-10-CM

## 2018-02-27 DIAGNOSIS — M545 Low back pain: Secondary | ICD-10-CM | POA: Diagnosis present

## 2018-02-27 DIAGNOSIS — K219 Gastro-esophageal reflux disease without esophagitis: Secondary | ICD-10-CM | POA: Diagnosis not present

## 2018-02-27 DIAGNOSIS — M79604 Pain in right leg: Secondary | ICD-10-CM

## 2018-02-27 DIAGNOSIS — M79602 Pain in left arm: Secondary | ICD-10-CM

## 2018-02-27 DIAGNOSIS — G8929 Other chronic pain: Secondary | ICD-10-CM | POA: Insufficient documentation

## 2018-02-27 DIAGNOSIS — I10 Essential (primary) hypertension: Secondary | ICD-10-CM | POA: Diagnosis not present

## 2018-02-27 DIAGNOSIS — M5442 Lumbago with sciatica, left side: Secondary | ICD-10-CM | POA: Insufficient documentation

## 2018-02-27 DIAGNOSIS — M79605 Pain in left leg: Secondary | ICD-10-CM

## 2018-02-27 MED ORDER — GNP CALCIUM 1200 1200-1000 MG-UNIT PO CHEW: 1200.0000 mg | CHEWABLE_TABLET | Freq: Every day | ORAL | 5 refills | Status: DC

## 2018-02-27 MED ORDER — VITAMIN D3 125 MCG (5000 UT) PO CAPS: 1.0000 | ORAL_CAPSULE | Freq: Every day | ORAL | 5 refills | Status: DC

## 2018-02-27 MED ORDER — ERGOCALCIFEROL 1.25 MG (50000 UT) PO CAPS: 50000.0000 [IU] | ORAL_CAPSULE | ORAL | 0 refills | Status: AC

## 2018-02-27 MED ORDER — MAGNESIUM 500 MG PO CAPS: 500.0000 mg | ORAL_CAPSULE | Freq: Two times a day (BID) | ORAL | 5 refills | Status: DC

## 2018-02-27 NOTE — Patient Instructions (Addendum)
Calcium, Vitamin D2, Vitamin D3 and Magnesium have been escribed to your pharmacy.____________________________________________________________________________________________  Pain Prevention Technique  Definition:   A technique used to minimize the effects of an activity known to cause inflammation or swelling, which in turn leads to an increase in pain.  Purpose: To prevent swelling from occurring. It is based on the fact that it is easier to prevent swelling from happening than it is to get rid of it, once it occurs.  Contraindications: 1. Anyone with allergy or hypersensitivity to the recommended medications. 2. Anyone taking anticoagulants (Blood Thinners) (e.g., Coumadin, Warfarin, Plavix, etc.). 3. Patients in Renal Failure.  Technique: Before you undertake an activity known to cause pain, or a flare-up of your chronic pain, and before you experience any pain, do the following:  1. On a full stomach, take 4 (four) over the counter Ibuprofens 200mg  tablets (Motrin), for a total of 800 mg. 2. In addition, take over the counter Magnesium 400 to 500 mg, before doing the activity.  3. Six (6) hours later, again on a full stomach, repeat the Ibuprofen. 4. That night, take a warm shower and stretch under the running warm water.  This technique may be sufficient to abort the pain and discomfort before it happens. Keep in mind that it takes a lot less medication to prevent swelling than it takes to eliminate it once it occurs.  ____________________________________________________________________________________________  ____________________________________________________________________________________________  Muscle Spasms & Cramps  Cause:  The most common cause of muscle spasms and cramps is vitamin and/or electrolyte (calcium, potassium, sodium, etc.) deficiencies.  Possible triggers: Sweating - causes loss of electrolytes thru the skin. Steroids - causes loss of electrolytes  thru the urine.  Treatment: 1. Gatorade (or any other electrolyte-replenishing drink) - Take 1, 8 oz glass with each meal (3 times a day). 2. OTC (over-the-counter) Magnesium 400 to 500 mg - Take 1 tablet twice a day (one with breakfast and one before bedtime). If you have kidney problems, talk to your primary care physician before taking any Magnesium. 3. Tonic Water with quinine - Take 1, 8 oz glass before bedtime.   ____________________________________________________________________________________________   ____________________________________________________________________________________________  CANNABIDIOL (AKA: CBD Oil or Pills)  Applies to: All patients receiving prescriptions of controlled substances (written and/or electronic).  General Information: Cannabidiol (CBD) was discovered in 72. It is one of some 113 identified cannabinoids in cannabis (Marijuana) plants, accounting for up to 40% of the plant's extract. As of 2018, preliminary clinical research on cannabidiol included studies of anxiety, cognition, movement disorders, and pain.  Cannabidiol is consummed in multiple ways, including inhalation of cannabis smoke or vapor, as an aerosol spray into the cheek, and by mouth. It may be supplied as CBD oil containing CBD as the active ingredient (no added tetrahydrocannabinol (THC) or terpenes), a full-plant CBD-dominant hemp extract oil, capsules, dried cannabis, or as a liquid solution. CBD is thought not have the same psychoactivity as THC, and may affect the actions of THC. Studies suggest that CBD may interact with different biological targets, including cannabinoid receptors and other neurotransmitter receptors. As of 2018 the mechanism of action for its biological effects has not been determined.  In the [place], cannabidiol has a limited approval by the Food and Drug Administration (FDA) for treatment of only two types of epilepsy disorders. The side effects of  long-term use of the drug include somnolence, decreased appetite, diarrhea, fatigue, malaise, weakness, sleeping problems, and others.  CBD remains a Schedule I drug prohibited for any use.  Legality: Some manufacturers ship CBD products nationally, an illegal action which the FDA has not enforced in 2018, with CBD remaining the subject of an FDA investigational new drug evaluation, and is not considered legal as a dietary supplement or food ingredient as of December 2018. Federal illegality has made it difficult historically to conduct research on CBD. CBD is openly sold in head shops and health food stores in some states where such sales have not been explicitly legalized.  Warning: Because it is not FDA approved for general use or treatment of pain, it is not required to undergo the same manufacturing controls as prescription drugs.  This means that the available cannabidiol (CBD) may be contaminated with THC.  If this is the case, it will trigger a positive urine drug screen (UDS) test for cannabinoids (Marijuana).  Because a positive UDS for illicit substances is a violation of our medication agreement, your opioid analgesics (pain medicine) may be permanently discontinued. (Last update: 11/01/2017) ____________________________________________________________________________________________  ____________________________________________________________________________________________  Smokers present with more severe and extended chronic pain outcomes and have a higher frequency of prescription opioid use. Current tobacco smoking is a strong predictor of risk for nonmedical use of prescription opioids. Opioid and nicotinic-cholinergic neurotransmitter systems interact in important ways to modulate opioid and nicotine effects: dopamine release induced by nicotine is dependent on facilitation by the opioid system, and the nicotinic-acetylcholine system modulates self-administration of several classes  of abused drugs-including opioids. Nicotine can serve as a prime for the use of other drugs, which in the case of the opioid system may be bidirectional. Opioids and compounds in tobacco, including nicotine, are metabolized by the cytochrome P450 enzyme system, but the metabolism of opioids and tobacco products can be complicated. Accordingly, drug interactions are possible but not always clear.  ____________________________________________________________________________________________  ____________________________________________________________________________________________  Pain Management Weight Control Diet   Note: Before starting this diet, make sure to talk to your primary care physician to make sure it is safe for you.   Breakfast:   1 boiled or pouched egg. You may use the egg white ready-made preparations.  1 wheat low calorie toast.   8 oz. black coffee with stevia (maximum of 2 packs). (No milk or creamer, and no other type of sweetener but stevia).   Lunch & Dinner:   5 oz. Lean Protein like chicken, fish, or lean meat (above 95% fat free).   No pork or any type of cold cuts or processed meats.   Steamed, backed or grilled, but not fried.   No oils, fats, or butter.   1 cup of steamed vegetables, or 1.5 cups if raw.   1 serving of salad, but no dressings, except vinegar or lemon juice.   Mid-day & Mid-afternoon snack:   1 fruit. No bananas. (total of 2 fruits per day)   Important Rules:  1. Must drink 100 oz. or more of water per day.   Consult your Primary Care Physician if you have a history of kidney failure, or congestive heart failure before doing this.  2. Take calcium and magnesium every day to avoid night cramps.   Consult your Primary Care Physician if you have a history of kidney failure, hypercalcemia, or parathyroid problems before doing this.   Over-the-counter calcium 600 to 1200 mg per day (in the morning). Take with Vitamin D 2000 IU every  day.   Over-the-counter magnesium 400 to 500 mg per day (1-2 hours prior to bedtime).  3. Control salt intake. (You can use it but  in moderation.)  4. Avoid eating anything after 6:00 pm.  5. Weight yourself every morning at the same time and record weight on a notebook.  6. Mix "Benefiber" 3 to 5 table spoons in water and drink before meals.  7. No sodas.  8. No alcohol.  9. No sugar.  10. No artificial sweeteners.   Stevia without sugar is the only sweetener aloud.  11. No bread except for the one breakfast toast.   Low calorie wheat bread.  12. Duration of diet: 2 weeks at a time with 2 days' rest, then repeat, until BMI is less than 30.  13. Your current Body mass index is 43.58 kg/m. 14. Do not over-eat or over-indulge yourself in the 2 days of rest from the diet.  ____________________________________________________________________________________________

## 2018-03-01 ENCOUNTER — Ambulatory Visit: Payer: Self-pay | Admitting: *Deleted

## 2018-03-01 NOTE — Telephone Encounter (Signed)
Pt seen 02/27/18 by Dr. Dossie Arbour at [facility]. Rx'd Vit D2 , D3 and magnesium.   Pt states she read not to take magnesium when on amlodipine. Pt wants to ensure PCP aware. Pt has not started these as of yet. PLease advise: [phone]

## 2018-03-03 NOTE — Telephone Encounter (Signed)
Please call patient and let her know that it should be safe for her to take those medications together. As always, we need to periodically check her blood pressure.

## 2018-03-08 NOTE — Telephone Encounter (Signed)
Called and Left VM for pt to return call to office.

## 2018-03-08 NOTE — Telephone Encounter (Signed)
Pt notified as instructed and pt voiced understanding. 

## 2018-03-19 ENCOUNTER — Telehealth: Payer: Self-pay

## 2018-03-19 NOTE — Telephone Encounter (Signed)
Copied from [place] [id number]. Topic: Quick Communication - See Telephone Encounter >> Mar 19, 2018  3:08 PM Hewitt Shorts wrote: Pt states that she has been feeling very tired and run down and would like to talk with someone  In regards to possibly checking her thyroid checked  Best number [phone]

## 2018-03-19 NOTE — Telephone Encounter (Signed)
Pt was last seen and TSH drawn on 01/09/18. No future lab or office visit appts.Please advise.

## 2018-03-20 NOTE — Telephone Encounter (Signed)
Please call her and schedule her an appointment.

## 2018-03-20 NOTE — Telephone Encounter (Signed)
I spoke to pt and sch labs Fri 8/9 and follow up with Debbie on 8/14. She needs orders for Fri labs.

## 2018-03-20 NOTE — Telephone Encounter (Signed)
For this complaint, she needs to be seen for labs to be ordered.

## 2018-03-22 ENCOUNTER — Ambulatory Visit (INDEPENDENT_AMBULATORY_CARE_PROVIDER_SITE_OTHER): Payer: Medicare HMO | Admitting: Family Medicine

## 2018-03-22 ENCOUNTER — Encounter: Payer: Self-pay | Admitting: Family Medicine

## 2018-03-22 ENCOUNTER — Other Ambulatory Visit: Payer: Medicare HMO

## 2018-03-22 ENCOUNTER — Telehealth: Payer: Self-pay

## 2018-03-22 VITALS — BP 126/82 | HR 81 | Temp 98.2°F | Ht 63.0 in | Wt 258.8 lb

## 2018-03-22 DIAGNOSIS — E039 Hypothyroidism, unspecified: Secondary | ICD-10-CM

## 2018-03-22 DIAGNOSIS — L989 Disorder of the skin and subcutaneous tissue, unspecified: Secondary | ICD-10-CM | POA: Diagnosis not present

## 2018-03-22 DIAGNOSIS — R5383 Other fatigue: Secondary | ICD-10-CM

## 2018-03-22 NOTE — Patient Instructions (Addendum)
Don't take ibuprofen on same day as naproxen  Use white SSD cream on your hand daily x 7 days  [facility]- [phone] for your CPAP supplies

## 2018-03-22 NOTE — Telephone Encounter (Signed)
Copied from [place] [id number]. Topic: Inquiry >> Mar 22, 2018  3:18 PM Pricilla Handler wrote: Reason for CRM: Patient called stating that she will need a referral for [place]. [facility] will not service the patient until there is an order. Please fax the order to [phone].       Thank You!!!

## 2018-03-22 NOTE — Progress Notes (Signed)
Subjective:    Patient ID: Teresa Mcgee, female    DOB: [birth year], 52 y.o.   MRN: [id number]  HPI  This is a 52 yo female who presents today with fatigue x 1 week.  Right sided chest pain that comes and goes throughout the day when she moves a certain way. Hurts when she presses on it.  Stomach feels hard on right side. Has frequent indigestion, not currently taking anything, has used gaviscon in past with good results. Has a lot of cooking to do today and tomorrow for a family reunion.  Some nasal congestion, taking Zyrtec.  OSA- machine broke 3 months ago. [facility] and was told she qualifies for a new machine. Thinks she used [place] before and that sleep study was about 3 years ago.  Has sore area on left wrist. Started as small bump and has gotten bigger. Has been applying vaseline.  Past Medical History:  Diagnosis Date  . Arthritis   . Bell's palsy may 2012  . Chest pain   . GERD (gastroesophageal reflux disease)   . H/O hiatal hernia   . Hyperlipidemia   . Hypertension   . Hypothyroidism   . Shingles   . Shortness of breath   . Sleep apnea    uses cpap   Past Surgical History:  Procedure Laterality Date  . CESAREAN SECTION    . cesarian     3x  . PARTIAL HYSTERECTOMY  2000   abnormal uterine bleeding  . THYROIDECTOMY  1998  . TOOTH EXTRACTION    . TUBAL LIGATION     Family History  Problem Relation Age of Onset  . Heart attack Father 39       deceased  . Diabetes Brother   . Stroke Brother 25       twice  . Stroke Sister 76  . Diabetes Sister   . Heart attack Sister 70  . Heart attack Brother 33   Social History   Tobacco Use  . Smoking status: Current Some Day Smoker    Packs/day: 0.30    Years: 20.00    Pack years: 6.00    Types: Cigarettes  . Smokeless tobacco: Never Used  . Tobacco comment: used to smoke 1.5 - 2 PPD  Substance Use Topics  . Alcohol use: No  . Drug use: No     Review of Systems Per HPI   Objective:   Physical Exam  Constitutional: She is oriented to person, place, and time. She appears well-developed and well-nourished. No distress.  HENT:  Head: Normocephalic and atraumatic.  Eyes: Conjunctivae are normal.  Neck: Normal range of motion. Neck supple.  Cardiovascular: Normal rate, regular rhythm and normal heart sounds.  Pulmonary/Chest: Effort normal and breath sounds normal.    Musculoskeletal: She exhibits no edema.  Neurological: She is alert and oriented to person, place, and time.  Skin: Skin is warm and dry. She is not diaphoretic.  Right distal forearm with approximately 3 cm area excoriation, shiny (from Vasoline), no drainage, mild erythema.   Psychiatric: She has a normal mood and affect. Her behavior is normal. Judgment and thought content normal.  Vitals reviewed.     BP 126/82 (BP Location: Left Arm, Patient Position: Sitting, Cuff Size: Large)   Pulse 81   Temp 98.2 F (36.8 C) (Oral)   Ht 5\' 3"  (1.6 m)   Wt 258 lb 12 oz (117.4 kg)   SpO2 95%   BMI 45.84 kg/m  Wt Readings from Last 3 Encounters:  03/22/18 258 lb 12 oz (117.4 kg)  02/27/18 246 lb (111.6 kg)  01/24/18 246 lb (111.6 kg)       Assessment & Plan:  1. Fatigue, unspecified type - likely multifactorial, will check labs - CBC with Differential - TSH  2. Acquired hypothyroidism TSH  3. Skin lesion of right arm - instructed her to stop Vasoline and provided her with a small amount SSD to apply daily, she was instructed to let me know if no improvement in 1 week.   4. Chest Wall Pain - continue current meds including naprosyn, can apply heat, do gentle ROM   Clarene Reamer, FNP-BC  [facility]  03/22/2018 3:00 PM

## 2018-03-23 LAB — CBC WITH DIFFERENTIAL/PLATELET
Basophils Absolute: 90 cells/uL (ref 0–200)
Basophils Relative: 1.5 %
EOS ABS: 180 {cells}/uL (ref 15–500)
EOS PCT: 3 %
HEMATOCRIT: 42.5 % (ref 35.0–45.0)
HEMOGLOBIN: 14.8 g/dL (ref 11.7–15.5)
LYMPHS ABS: 2898 {cells}/uL (ref 850–3900)
MCH: 29.8 pg (ref 27.0–33.0)
MCHC: 34.8 g/dL (ref 32.0–36.0)
MCV: 85.5 fL (ref 80.0–100.0)
MPV: 12.5 fL (ref 7.5–12.5)
Monocytes Relative: 5.8 %
NEUTROS PCT: 41.4 %
Neutro Abs: 2484 cells/uL (ref 1500–7800)
Platelets: 204 10*3/uL (ref 140–400)
RBC: 4.97 10*6/uL (ref 3.80–5.10)
RDW: 13.2 % (ref 11.0–15.0)
Total Lymphocyte: 48.3 %
WBC mixed population: 348 cells/uL (ref 200–950)
WBC: 6 10*3/uL (ref 3.8–10.8)

## 2018-03-23 LAB — TSH: TSH: 3.41 mIU/L

## 2018-03-25 ENCOUNTER — Other Ambulatory Visit: Payer: Self-pay | Admitting: Family Medicine

## 2018-03-25 DIAGNOSIS — G4733 Obstructive sleep apnea (adult) (pediatric): Secondary | ICD-10-CM

## 2018-03-25 DIAGNOSIS — Z9989 Dependence on other enabling machines and devices: Principal | ICD-10-CM

## 2018-03-25 NOTE — Telephone Encounter (Signed)
Please call patient and let her know that I have put in a referral for pulmonary for evaluation for sleep study. I have no records to be able to order her equipment.

## 2018-03-25 NOTE — Telephone Encounter (Signed)
Called and left CM for pt to return call to office.

## 2018-03-25 NOTE — Telephone Encounter (Signed)
Called and informed patient of information. Understanding verbalized nothing further needed.

## 2018-03-27 ENCOUNTER — Ambulatory Visit: Payer: Medicare HMO | Admitting: Family Medicine

## 2018-04-01 ENCOUNTER — Other Ambulatory Visit: Payer: Self-pay | Admitting: Pain Medicine

## 2018-04-01 DIAGNOSIS — E559 Vitamin D deficiency, unspecified: Secondary | ICD-10-CM

## 2018-04-03 ENCOUNTER — Encounter: Payer: Self-pay | Admitting: Internal Medicine

## 2018-04-03 ENCOUNTER — Ambulatory Visit (INDEPENDENT_AMBULATORY_CARE_PROVIDER_SITE_OTHER): Payer: Medicare HMO | Admitting: Internal Medicine

## 2018-04-03 VITALS — BP 124/72 | HR 95 | Resp 16 | Ht 63.0 in | Wt 261.0 lb

## 2018-04-03 DIAGNOSIS — F17210 Nicotine dependence, cigarettes, uncomplicated: Secondary | ICD-10-CM

## 2018-04-03 DIAGNOSIS — G4733 Obstructive sleep apnea (adult) (pediatric): Secondary | ICD-10-CM | POA: Diagnosis not present

## 2018-04-03 DIAGNOSIS — Z720 Tobacco use: Secondary | ICD-10-CM

## 2018-04-03 MED ORDER — NICOTINE 21 MG/24HR TD PT24: 21.0000 mg | MEDICATED_PATCH | TRANSDERMAL | 3 refills | Status: DC

## 2018-04-03 NOTE — Progress Notes (Signed)
[facility] Consultation      Assessment and Plan:  Obstructive sleep apnea with excessive daytime sleepiness. - No history of sleep apnea, CPAP recently stopped working. - We will obtain results from most recent sleep study to order new CPAP.  If these are not available we will order auto CPAP with pressure range 5-20.  Nicotine abuse. - Spent 4 minutes of discussion, patient is ready to consider smoking cessation. - She is ready to quit at this time, we discussed setting a quit date, sent in prescription for nicotine patches.  Meds ordered this encounter  Medications  . nicotine (NICODERM CQ - DOSED IN MG/24 HOURS) 21 mg/24hr patch    Sig: Place 1 patch (21 mg total) onto the skin daily.    Dispense:  30 patch    Refill:  3    Return in about 3 months (around 07/04/2018).    Date: 04/03/2018  MRN# [id number] Teresa Mcgee 10/22/1965   Teresa Mcgee is a 51 y.o. old female seen in consultation for chief complaint of:    Chief Complaint  Patient presents with  . Consult    referred b Dr. Carlean Purl fo eval of OSA on PAP.  Marland Kitchen Sleep Apnea    pt cpap machine broke about 6 mos ago    HPI:   The patient is a 52 year old female, she presents with snoring, trouble with sleeping, excessive daytime sleepiness.  She usually goes to bed around 1 AM, and often takes her 1 to 1-1/2 hours to fall asleep.  She gets out of bed around 1 AM.  She has a previous diagnosis of obstructive sleep apnea, she was on CPAP since about 2007 but the machine broke about 6 months ago.  She is smoking about 1/3 ppd, she continues to try to cut down. She has tried nic gum. She has not tried patches.   She called her insurance and she is apparently eligible for the machine. [facility]   **Chest Xray 06/28/16>>normal lungs. Images personally reviewed.   PMHX:   Past Medical History:  Diagnosis Date  . Arthritis   . Bell's palsy may 2012    . Chest pain   . GERD (gastroesophageal reflux disease)   . H/O hiatal hernia   . Hyperlipidemia   . Hypertension   . Hypothyroidism   . Shingles   . Shortness of breath   . Sleep apnea    uses cpap   Surgical Hx:  Past Surgical History:  Procedure Laterality Date  . CESAREAN SECTION    . cesarian     3x  . PARTIAL HYSTERECTOMY  2000   abnormal uterine bleeding  . THYROIDECTOMY  1998  . TOOTH EXTRACTION    . TUBAL LIGATION     Family Hx:  Family History  Problem Relation Age of Onset  . Heart attack Father 69       deceased  . Diabetes Brother   . Stroke Brother 25       twice  . Stroke Sister 22  . Diabetes Sister   . Heart attack Sister 1  . Heart attack Brother 75   Social Hx:   Social History   Tobacco Use  . Smoking status: Current Some Day Smoker    Packs/day: 0.30    Years: 20.00    Pack years: 6.00    Types: Cigarettes  . Smokeless tobacco: Never Used  . Tobacco comment: used to  smoke 1.5 - 2 PPD  Substance Use Topics  . Alcohol use: No  . Drug use: No   Medication:    Current Outpatient Medications:  .  amLODipine (NORVASC) 10 MG tablet, Take 1 tablet (10 mg total) by mouth daily., Disp: 90 tablet, Rfl: 3 .  Calcium Carbonate-Vit D-Min (GNP CALCIUM 1200) 1200-1000 MG-UNIT CHEW, Chew 1,200 mg by mouth daily with breakfast. Take in combination with vitamin D and magnesium., Disp: 30 tablet, Rfl: 5 .  carboxymethylcellulose 1 % ophthalmic solution, Apply 1 drop to eye 3 (three) times daily., Disp: 30 mL, Rfl: 12 .  cetirizine (ZYRTEC) 10 MG tablet, Take 1 tablet (10 mg total) by mouth daily., Disp: 90 tablet, Rfl: 3 .  Cholecalciferol (VITAMIN D3) 5000 units CAPS, Take 1 capsule (5,000 Units total) by mouth daily with breakfast. Take along with calcium and magnesium., Disp: 30 capsule, Rfl: 5 .  ergocalciferol (VITAMIN D2) 50000 units capsule, Take 1 capsule (50,000 Units total) by mouth 2 (two) times a week. X 6 weeks., Disp: 12 capsule, Rfl: 0 .   fluticasone (FLONASE) 50 MCG/ACT nasal spray, Place 2 sprays into both nostrils daily., Disp: 16 g, Rfl: 6 .  gabapentin (NEURONTIN) 100 MG capsule, TAKE 1 CAPSULE AT BEDTIME X 5 DAYS THEN INCREASE TO 2 CAPSULES AT BEDTIME, Disp: 180 capsule, Rfl: 0 .  hydrochlorothiazide (HYDRODIURIL) 25 MG tablet, Take 1 tablet (25 mg total) by mouth daily. as directed, Disp: 90 tablet, Rfl: 3 .  ketotifen (ZADITOR) 0.025 % ophthalmic solution, Place 1 drop into both eyes 2 (two) times daily., Disp: 5 mL, Rfl: 0 .  levothyroxine (SYNTHROID, LEVOTHROID) 100 MCG tablet, TAKE 1 TABLET BY MOUTH EVERY DAY, Disp: 90 tablet, Rfl: 1 .  Magnesium 500 MG CAPS, Take 1 capsule (500 mg total) by mouth 2 (two) times daily at 8 am and 10 pm., Disp: 60 capsule, Rfl: 5 .  naproxen (NAPROSYN) 500 MG tablet, Take 500 mg by mouth 2 (two) times daily with a meal., Disp: , Rfl:  .  REFRESH CELLUVISC 1 % GEL, APPLY 1 DROP TO EYE 3 (THREE) TIMES DAILY., Disp: , Rfl: 12   Allergies:  Patient has no known allergies.  Review of Systems: Gen:  Denies  fever, sweats, chills HEENT: Denies blurred vision, double vision. bleeds, sore throat Cvc:  No dizziness, chest pain. Resp:   Denies cough or sputum production, shortness of breath Gi: Denies swallowing difficulty, stomach pain. Gu:  Denies bladder incontinence, burning urine Ext:   No Joint pain, stiffness. Skin: No skin rash,  hives  Endoc:  No polyuria, polydipsia. Psych: No depression, insomnia. Other:  All other systems were reviewed with the patient and were negative other that what is mentioned in the HPI.   Physical Examination:   VS: BP 124/72 (BP Location: Left Arm, Cuff Size: Large)   Pulse 95   Resp 16   Ht 5\' 3"  (1.6 m)   Wt 261 lb (118.4 kg)   SpO2 (!) 77%   BMI 46.23 kg/m   General Appearance: No distress  Neuro:without focal findings,  speech normal,  HEENT: PERRLA, EOM intact.   Pulmonary: normal breath sounds, No wheezing.  CardiovascularNormal S1,S2.   No m/r/g.   Abdomen: Benign, Soft, non-tender. Renal:  No costovertebral tenderness  GU:  No performed at this time. Endoc: No evident thyromegaly, no signs of acromegaly. Skin:   warm, no rashes, no ecchymosis  Extremities: normal, no cyanosis, clubbing.  Other findings:  LABORATORY PANEL:   CBC No results for input(s): WBC, HGB, HCT, PLT in the last 168 hours. ------------------------------------------------------------------------------------------------------------------  Chemistries  No results for input(s): NA, K, CL, CO2, GLUCOSE, BUN, CREATININE, CALCIUM, MG, AST, ALT, ALKPHOS, BILITOT in the last 168 hours.  Invalid input(s): GFRCGP ------------------------------------------------------------------------------------------------------------------  Cardiac Enzymes No results for input(s): TROPONINI in the last 168 hours. ------------------------------------------------------------  RADIOLOGY:  No results found.     Thank  you for the consultation and for allowing [facility] to assist in the care of your patient. Our recommendations are noted above.  Please contact us if we can be of further service.   Marda Stalker, M.D., F.C.C.P.  Board Certified in Internal Medicine, Pulmonary Medicine, [place], and Sleep Medicine.  [facility] Office Number: [phone]   04/03/2018

## 2018-04-03 NOTE — Patient Instructions (Addendum)
Will obtain results of previous sleep study and start patient on CPAP. If previous results not available will start auto-CPAP with pressure range of 5-20 cm H2O.   --The best way to quit is to set a quit date, usually a day that has meaning like someone's birthday, anniversary, or holiday.   --Start any medication prescribed for quitting one week before you quit date. Then toss out the cigarettes on your quit date.  --If you start smoking again, start from scratch--set another quit day and try again!

## 2018-04-10 ENCOUNTER — Telehealth: Payer: Self-pay | Admitting: Internal Medicine

## 2018-04-10 ENCOUNTER — Other Ambulatory Visit: Payer: Self-pay | Admitting: *Deleted

## 2018-04-10 DIAGNOSIS — G4719 Other hypersomnia: Secondary | ICD-10-CM

## 2018-04-10 DIAGNOSIS — G4733 Obstructive sleep apnea (adult) (pediatric): Secondary | ICD-10-CM

## 2018-04-10 NOTE — Telephone Encounter (Signed)
Returned patient's call and [employer] does not require PA for HST. HST scheduled for 05/13/18 between 3:30-4:30. Pt is aware of appointment date and time. Nothing else is needed at this time. Rhonda J Cobb

## 2018-04-10 NOTE — Progress Notes (Signed)
Pt last sleep study 01/29/2007

## 2018-04-10 NOTE — Telephone Encounter (Signed)
Patient returning call.

## 2018-04-21 NOTE — Progress Notes (Signed)
Patient's Name: Teresa Mcgee  MRN: [id number]  Referring Provider: Elby Beck, FNP  DOB: 1965-10-03  PCP: Elby Beck, FNP  DOS: 04/22/2018  Note by: Gaspar Cola, MD  Service setting: Ambulatory outpatient  Specialty: Interventional Pain Management  Location: [facility] (AMB) Pain Management Facility    Patient type: Established   Primary Reason(s) for Visit: Evaluation of chronic illnesses with exacerbation, or progression (Level of risk: moderate) CC: Back Pain (lower) and Neck Pain  HPI  Teresa Mcgee is a 52 y.o. year old, female patient, who comes today for a follow-up evaluation. She has Hypothyroidism; HYPERCHOLESTEROLEMIA; TOBACCO ABUSE; DEPRESSION; HYPERTENSION, BENIGN ESSENTIAL; GERD; Sleep apnea; Fatigue; Nasal septal perforation; Muscle spasms of neck; Nasal mucositis (ulcerative); Numbness and tingling in both hands; Xerotic eczema; Obesity (BMI 35.0-39.9 without comorbidity); Constipation; Chest pain; Impaired glucose tolerance; Osteopathic Exam Findings (Somatic Dysfunction); Chronic low back pain (Bilateral) (L>R) w/ sciatica (Bilateral); Chronic lower extremity pain ( Secondary  Area of  Pain) (Bilateral) (L>R); Chronic neck pain (Tertiary Area of Pain) (Bilateral) (L>R); Chronic pain syndrome; Long term current use of opiate analgesic; Pharmacologic therapy; Disorder of skeletal system; Problems influencing health status; Chronic upper extremity pain (Fourth Area of Pain) (Bilateral) (L>R); Vitamin D deficiency; Cervicalgia; Chronic musculoskeletal pain; DDD (degenerative disc disease), thoracic; DDD (degenerative disc disease), cervical; Lumbar facet arthropathy (Bilateral); Lumbar facet syndrome (Bilateral); Chronic low back pain (Primary Area of Pain) (Bilateral) (L>R) w/o sciatica; Spondylosis without myelopathy or radiculopathy, cervical region; Spondylosis without myelopathy or radiculopathy, lumbosacral region; DDD (degenerative disc disease), lumbosacral; Strain of  lumbar paraspinal muscle, sequela; Chronic hip pain (Bilateral) (R>L); Chronic sacroiliac joint pain (Left); Neurogenic pain; Osteoarthritis involving multiple joints; and Chronic knee pain (Bilateral) (R>L) on their problem list. Teresa Mcgee was last seen on 04/01/2018. Her primarily concern today is the Back Pain (lower) and Neck Pain  Pain Assessment: Location: Lower Back Radiating: both legs to the feet Onset: More than a month ago Duration: Chronic pain Quality: Aching, Pressure, Nagging, Heaviness Severity: 7 /10 (subjective, self-reported pain score)  Note: Reported level is inconsistent with clinical observations. Clinically the patient looks like a 3/10 A 3/10 is viewed as "Moderate" and described as significantly interfering with activities of daily living (ADL). It becomes difficult to feed, bathe, get dressed, get on and off the toilet or to perform personal hygiene functions. Difficult to get in and out of bed or a chair without assistance. Very distracting. With effort, it can be ignored when deeply involved in activities. Information on the proper use of the pain scale provided to the patient today. When using our objective Pain Scale, levels between 6 and 10/10 are said to belong in an emergency room, as it progressively worsens from a 6/10, described as severely limiting, requiring emergency care not usually available at an outpatient pain management facility. At a 6/10 level, communication becomes difficult and requires great effort. Assistance to reach the emergency department may be required. Facial flushing and profuse sweating along with potentially dangerous increases in heart rate and blood pressure will be evident. Timing: Intermittent Modifying factors: lying down BP: (!) 115/99  HR: 71   She indicates that she is here because she need pain medicine and Dr. Glade Lloyd, her prior physician, lost her license. She described not being interested in any injections, shots, nerve  blocks or any other type of interventional therapies.  However, the patient returns today indicating that she has thought about it and is interested in getting this pain better,  no matter how we do it.  According to the patient her primary area pain is in her lower back. States that she suffered a fall around 2006 while on the job. He admits that the left side is greater than the right (L>R). She describes it as a pulling type pain. She denies any previous surgery. She has had previous epidural steroid injections by Dr. Dossie Arbour in the past. She states they were not effective. She admits that she was told that surgery was not an option she did complete physical therapy in 2011 (4-5 sessions). She admits that this was not effective and may have aggravated the pain. She denies any recent images.  Second area of pain is in her lower extremities. She admits the pain goes down back of the leg.She admits that she has occasional numbness and tingling that goes all the way down into her feet. She admits that she has weakness but does not use assistive devices. She admits that she did have a nerve conduction study in [place].  Her third area pain is in her neck. She admits the left side is greater than the right (L>R). She denies any previous surgery or interventional therapy. She admits that physical therapy in 2011 was also inclusive of her neck.   Her fourth area of pain is in her upper extremities. She admits that the left arm is greater than the right (L>R). She has numbness and tingling that goes down her arm and affects all of her fingers. She denies any previous surgery, interventional therapy forher arms.   During today's physical exam the patient demonstrated having pain in both hips and both knees as well as the left SI joint.  She also proved to be positive for hyperextension and rotation suggesting bilateral lumbar facet syndrome.  Reviewing her medical records we find that all  of her imaging work is old and we will be updated today.  She is morbidly obese and this is contributing significantly to a lot of her symptoms.  Further details on both, my assessment(s), as well as the proposed treatment plan, please see below.  Today we will be taken over her gabapentin and titrated up as tolerated.  In addition, we will discontinue her Naprosyn which she indicates this given her GI side effects and we will try some meloxicam.  I will also schedule her to return for a diagnostic bilateral lumbar facet block under fluoroscopic guidance and IV sedation.  Controlled Substance Pharmacotherapy Assessment REMS (Risk Evaluation and Mitigation Strategy)  Analgesic: None Highest recorded MME/day:60 mg/day MME/day:0 mg/day  Laboratory Chemistry  Inflammation Markers (CRP: Acute Phase) (ESR: Chronic Phase) Lab Results  Component Value Date   CRP 5.5 (H) 01/24/2018   ESRSEDRATE 40 01/24/2018                         Renal Function Markers Lab Results  Component Value Date   BUN 7 01/24/2018   CREATININE 0.86 01/24/2018   BCR 8 (L) 01/24/2018   GFRAA 90 01/24/2018   GFRNONAA 78 01/24/2018                             Hepatic Function Markers Lab Results  Component Value Date   AST 94 (H) 01/24/2018   ALT 81 (H) 11/16/2017   ALBUMIN 4.4 01/24/2018   ALKPHOS 89 01/24/2018  Electrolytes Lab Results  Component Value Date   NA 143 01/24/2018   K 3.3 (L) 01/24/2018   CL 95 (L) 01/24/2018   CALCIUM 9.8 01/24/2018   MG 2.0 01/24/2018                        Neuropathy Markers Lab Results  Component Value Date   VITAMINB12 356 01/24/2018   HGBA1C 6.5 (H) 11/16/2017   HIV NON REACTIVE 03/21/2011                        Bone Pathology Markers Lab Results  Component Value Date   25OHVITD1 9.3 (L) 01/24/2018   25OHVITD2 <1.0 01/24/2018   25OHVITD3 9.3 01/24/2018                         Coagulation Parameters Lab Results  Component Value  Date   PLT 204 03/22/2018   DDIMER 0.69 (H) 01/11/2012                        Cardiovascular Markers Lab Results  Component Value Date   CKTOTAL 586 (H) 01/11/2012   CKMB 5.2 (H) 01/11/2012   TROPONINI <0.30 01/11/2012   HGB 14.8 03/22/2018   HCT 42.5 03/22/2018                         Note: Lab results reviewed.  Recent Diagnostic Imaging Review  Cervical Imaging: Cervical MR wo contrast:  Results for orders placed in visit on 04/13/11  MR Cervical Spine Wo Contrast   Cervical CT wo contrast:  Results for orders placed during the hospital encounter of 05/18/13  CT Cervical Spine Wo Contrast   Narrative *RADIOLOGY REPORT*  Clinical Data:  Arm pain, headache.  CT HEAD WITHOUT CONTRAST CT CERVICAL SPINE WITHOUT CONTRAST  Technique:  Multidetector CT imaging of the head and cervical spine was performed following the standard protocol without intravenous contrast.  Multiplanar CT image reconstructions of the cervical spine were also generated.  Comparison:  None available at time of study interpretation.  CT HEAD  Findings: The ventricles and sulci are normal.  No intraparenchymal hemorrhage, mass effect nor midline shift.  No acute large vascular territory infarcts.  No abnormal extra-axial fluid collections.  Basal cisterns are patent.  No skull fracture.  Visualized paranasal sinuses and mastoid aircells are well-aerated.  The included ocular globes and orbital contents are non-suspicious. The patient is edentulous.  IMPRESSION: No acute intracranial process.  Normal noncontrast CT of the head for age.  CT CERVICAL SPINE  Findings: Cervical vertebrae appear intact and aligned with straightened cervical lordosis. Intervertebral disc heights generally preserved. Mild ventral endplate spurring at Y6-5.  No destructive bony lesions.  Included prevertebral paraspinal soft tissues are non suspicious.  Minimal calcific atherosclerosis of the carotid bulbs.   Slight effacement left piriformis sinus may reflect be secondary to secretions.  Limited assessment for disc bulge due to the larger body habitus. No definite canal stenosis or neural foraminal narrowing.  IMPRESSION: No acute cervical spine fracture or malalignment.  Straightened cervical lordosis.   Original Report Authenticated By: Elon Alas    Lumbosacral Imaging: Lumbar DG (Complete) 4+V:  Results for orders placed during the hospital encounter of 03/03/14  DG Lumbar Spine Complete   Narrative CLINICAL DATA:  Lower back pain post fall  EXAM: LUMBAR SPINE - COMPLETE  4+ VIEW  COMPARISON:  10/10/2010  FINDINGS: Five views of lumbar spine submitted. No acute fracture or subluxation. Again noted mild degenerative changes and T11-T12 level.  IMPRESSION: No acute fracture or subluxation. Mild degenerative changes lower thoracic spine.   Electronically Signed   By: Lahoma Crocker M.D.   On: 03/03/2014 20:58    Knee Imaging: Knee-R DG 4 views:  Results for orders placed during the hospital encounter of 01/27/14  DG Knee Complete 4 Views Right   Narrative CLINICAL DATA:  Pain  EXAM: RIGHT KNEE - COMPLETE 4+ VIEW  COMPARISON:  None.  FINDINGS: Frontal, lateral, and bilateral oblique views were obtained. There is no fracture, dislocation, or effusion. Joint spaces appear intact. No erosive change.  IMPRESSION: No fracture or effusion.  No appreciable arthropathy.   Electronically Signed   By: Lowella Grip M.D.   On: 01/27/2014 08:47    Complexity Note: Imaging results reviewed. Results shared with Teresa Mcgee, using Layman's terms.                         Meds   Current Outpatient Medications:  .  amLODipine (NORVASC) 10 MG tablet, Take 1 tablet (10 mg total) by mouth daily., Disp: 90 tablet, Rfl: 3 .  Calcium Carbonate-Vit D-Min (GNP CALCIUM 1200) 1200-1000 MG-UNIT CHEW, Chew 1,200 mg by mouth daily with breakfast. Take in combination with  vitamin D and magnesium., Disp: 30 tablet, Rfl: 5 .  carboxymethylcellulose 1 % ophthalmic solution, Apply 1 drop to eye 3 (three) times daily., Disp: 30 mL, Rfl: 12 .  cetirizine (ZYRTEC) 10 MG tablet, Take 1 tablet (10 mg total) by mouth daily., Disp: 90 tablet, Rfl: 3 .  fluticasone (FLONASE) 50 MCG/ACT nasal spray, Place 2 sprays into both nostrils daily., Disp: 16 g, Rfl: 6 .  gabapentin (NEURONTIN) 100 MG capsule, Take 1-3 capsules (100-300 mg total) by mouth 4 (four) times daily. Follow written titration schedule., Disp: 360 capsule, Rfl: 0 .  hydrochlorothiazide (HYDRODIURIL) 25 MG tablet, Take 1 tablet (25 mg total) by mouth daily. as directed, Disp: 90 tablet, Rfl: 3 .  ketotifen (ZADITOR) 0.025 % ophthalmic solution, Place 1 drop into both eyes 2 (two) times daily., Disp: 5 mL, Rfl: 0 .  levothyroxine (SYNTHROID, LEVOTHROID) 100 MCG tablet, TAKE 1 TABLET BY MOUTH EVERY DAY, Disp: 90 tablet, Rfl: 1 .  Magnesium 500 MG CAPS, Take 1 capsule (500 mg total) by mouth 2 (two) times daily at 8 am and 10 pm., Disp: 60 capsule, Rfl: 5 .  REFRESH CELLUVISC 1 % GEL, APPLY 1 DROP TO EYE 3 (THREE) TIMES DAILY., Disp: , Rfl: 12 .  meloxicam (MOBIC) 15 MG tablet, Take 1 tablet (15 mg total) by mouth daily., Disp: 30 tablet, Rfl: 0  ROS  Constitutional: Denies any fever or chills Gastrointestinal: No reported hemesis, hematochezia, vomiting, or acute GI distress Musculoskeletal: Denies any acute onset joint swelling, redness, loss of ROM, or weakness Neurological: No reported episodes of acute onset apraxia, aphasia, dysarthria, agnosia, amnesia, paralysis, loss of coordination, or loss of consciousness  Allergies  Teresa Mcgee has No Known Allergies.  [place]  Drug: Teresa Mcgee  reports that she does not use drugs. Alcohol:  reports that she does not drink alcohol. Tobacco:  reports that she has been smoking cigarettes. She has a 6.00 pack-year smoking history. She has never used smokeless  tobacco. Medical:  has a past medical history of Arthritis, Bell's palsy (may 2012), Chest  pain, GERD (gastroesophageal reflux disease), H/O hiatal hernia, Hyperlipidemia, Hypertension, Hypothyroidism, Shingles, Shortness of breath, and Sleep apnea. Surgical: Teresa Mcgee  has a past surgical history that includes Thyroidectomy (1998); cesarian; Partial hysterectomy (2000); Cesarean section; Tubal ligation; and Tooth extraction. Family: family history includes Diabetes in her brother and sister; Heart attack (age of onset: 54) in her brother; Heart attack (age of onset: 59) in her sister; Heart attack (age of onset: 36) in her father; Stroke (age of onset: 33) in her brother; Stroke (age of onset: 86) in her sister.  Constitutional Exam  General appearance: Well nourished, well developed, and well hydrated. In no apparent acute distress Vitals:   04/22/18 0839  BP: (!) 115/99  Pulse: 71  Resp: 18  Temp: 98.2 F (36.8 C)  TempSrc: Oral  SpO2: 100%  Weight: 246 lb (111.6 kg)  Height: 5' 3" (1.6 m)   BMI Assessment: Estimated body mass index is 43.58 kg/m as calculated from the following:   Height as of this encounter: 5' 3" (1.6 m).   Weight as of this encounter: 246 lb (111.6 kg).  BMI interpretation table: BMI level Category Range association with higher incidence of chronic pain  <18 kg/m2 Underweight   18.5-24.9 kg/m2 Ideal body weight   25-29.9 kg/m2 Overweight Increased incidence by 20%  30-34.9 kg/m2 Obese (Class I) Increased incidence by 68%  35-39.9 kg/m2 Severe obesity (Class II) Increased incidence by 136%  >40 kg/m2 Extreme obesity (Class III) Increased incidence by 254%   Patient's current BMI Ideal Body weight  Body mass index is 43.58 kg/m. Ideal body weight: 52.4 kg (115 lb 8.3 oz) Adjusted ideal body weight: 76.1 kg (167 lb 11.4 oz)   BMI Readings from Last 4 Encounters:  04/22/18 43.58 kg/m  04/03/18 46.23 kg/m  03/22/18 45.84 kg/m  02/27/18 43.58 kg/m    Wt Readings from Last 4 Encounters:  04/22/18 246 lb (111.6 kg)  04/03/18 261 lb (118.4 kg)  03/22/18 258 lb 12 oz (117.4 kg)  02/27/18 246 lb (111.6 kg)  Psych/Mental status: Alert, oriented x 3 (person, place, & time)       Eyes: PERLA Respiratory: No evidence of acute respiratory distress  Cervical Spine Area Exam  Skin & Axial Inspection: No masses, redness, edema, swelling, or associated skin lesions Alignment: Symmetrical Functional ROM: Decreased ROM      Stability: No instability detected Muscle Tone/Strength: Inconsistent level of performance when tested Sensory (Neurological): Movement-associated pain Palpation: Complains of area being tender to palpation              Upper Extremity (UE) Exam    Side: Right upper extremity  Side: Left upper extremity  Skin & Extremity Inspection: Skin color, temperature, and hair growth are WNL. No peripheral edema or cyanosis. No masses, redness, swelling, asymmetry, or associated skin lesions. No contractures.  Skin & Extremity Inspection: Skin color, temperature, and hair growth are WNL. No peripheral edema or cyanosis. No masses, redness, swelling, asymmetry, or associated skin lesions. No contractures.  Functional ROM: Unrestricted ROM          Functional ROM: Unrestricted ROM          Muscle Tone/Strength: Functionally intact. No obvious neuro-muscular anomalies detected.  Muscle Tone/Strength: Functionally intact. No obvious neuro-muscular anomalies detected.  Sensory (Neurological): Unimpaired          Sensory (Neurological): Unimpaired          Palpation: No palpable anomalies  Palpation: No palpable anomalies              Provocative Test(s):  Phalen's test: deferred Tinel's test: deferred Apley's scratch test (touch opposite shoulder):  Action 1 (Across chest): deferred Action 2 (Overhead): deferred Action 3 (LB reach): deferred   Provocative Test(s):  Phalen's test: deferred Tinel's test: deferred Apley's  scratch test (touch opposite shoulder):  Action 1 (Across chest): deferred Action 2 (Overhead): deferred Action 3 (LB reach): deferred    Thoracic Spine Area Exam  Skin & Axial Inspection: No masses, redness, or swelling Alignment: Symmetrical Functional ROM: Unrestricted ROM Stability: No instability detected Muscle Tone/Strength: Functionally intact. No obvious neuro-muscular anomalies detected. Sensory (Neurological): Unimpaired Muscle strength & Tone: No palpable anomalies  Lumbar Spine Area Exam  Skin & Axial Inspection: No masses, redness, or swelling Alignment: Symmetrical Functional ROM: Decreased ROM       Stability: No instability detected Muscle Tone/Strength: Functionally intact. No obvious neuro-muscular anomalies detected. Sensory (Neurological): Movement-associated pain Palpation: Complains of area being tender to palpation       Provocative Tests: Hyperextension/rotation test: (+) bilaterally for facet joint pain. Lumbar quadrant test (Kemp's test): (+) bilaterally for facet joint pain. Lateral bending test: deferred today       Patrick's Maneuver: (+) for left-sided S-I arthralgia and for bilateral hip arthralgia FABER test: (+) for left-sided S-I arthralgia and for bilateral hip arthralgia S-I anterior distraction/compression test: deferred today         S-I lateral compression test: deferred today         S-I Thigh-thrust test: deferred today         S-I Gaenslen's test: deferred today          Gait & Posture Assessment  Ambulation: Limited Gait: Modified gait pattern (slower gait speed, wider stride width, and longer stance duration) associated with morbid obesity Posture: Antalgic   Lower Extremity Exam    Side: Right lower extremity  Side: Left lower extremity  Stability: No instability observed          Stability: No instability observed          Skin & Extremity Inspection: Skin color, temperature, and hair growth are WNL. No peripheral edema or  cyanosis. No masses, redness, swelling, asymmetry, or associated skin lesions. No contractures.  Skin & Extremity Inspection: Skin color, temperature, and hair growth are WNL. No peripheral edema or cyanosis. No masses, redness, swelling, asymmetry, or associated skin lesions. No contractures.  Functional ROM: Unrestricted ROM                  Functional ROM: Unrestricted ROM                  Muscle Tone/Strength: Able to Toe-walk & Heel-walk without problems  Muscle Tone/Strength: Able to Toe-walk & Heel-walk without problems  Sensory (Neurological): Unimpaired  Sensory (Neurological): Unimpaired  Palpation: No palpable anomalies  Palpation: No palpable anomalies   Assessment  Primary Diagnosis & Pertinent Problem List: The primary encounter diagnosis was Chronic low back pain (Primary Area of Pain) (Bilateral) (L>R) w/o sciatica. Diagnoses of Chronic pain syndrome, DDD (degenerative disc disease), lumbosacral, Spondylosis without myelopathy or radiculopathy, lumbosacral region, Lumbar facet arthropathy (Bilateral), Lumbar facet syndrome (Bilateral), Strain of lumbar paraspinous muscle, sequela, Chronic lower extremity pain ( Secondary  Area of  Pain) (Bilateral) (L>R), Chronic neck pain (Tertiary Area of Pain) (Bilateral) (L>R), Cervicalgia, DDD (degenerative disc disease), cervical, Spondylosis without myelopathy or radiculopathy, cervical region, Chronic upper extremity  pain (Fourth Area of Pain) (Bilateral) (L>R), Muscle spasms of neck, DDD (degenerative disc disease), thoracic, Chronic musculoskeletal pain, Disorder of skeletal system, Problems influencing health status, Pharmacologic therapy, Long term current use of opiate analgesic, Other intervertebral disc degeneration, lumbar region, Chronic hip pain (Bilateral) (R>L), Chronic sacroiliac joint pain (Left), Chronic bilateral low back pain with bilateral sciatica, Neurogenic pain, Osteoarthritis involving multiple joints, and Chronic knee pain  (Bilateral) (R>L) were also pertinent to this visit.  Status Diagnosis  Unimproved Unimproved Unimproved 1. Chronic low back pain (Primary Area of Pain) (Bilateral) (L>R) w/o sciatica   2. Chronic pain syndrome   3. DDD (degenerative disc disease), lumbosacral   4. Spondylosis without myelopathy or radiculopathy, lumbosacral region   5. Lumbar facet arthropathy (Bilateral)   6. Lumbar facet syndrome (Bilateral)   7. Strain of lumbar paraspinous muscle, sequela   8. Chronic lower extremity pain ( Secondary  Area of  Pain) (Bilateral) (L>R)   9. Chronic neck pain (Tertiary Area of Pain) (Bilateral) (L>R)   10. Cervicalgia   11. DDD (degenerative disc disease), cervical   12. Spondylosis without myelopathy or radiculopathy, cervical region   13. Chronic upper extremity pain (Fourth Area of Pain) (Bilateral) (L>R)   14. Muscle spasms of neck   15. DDD (degenerative disc disease), thoracic   16. Chronic musculoskeletal pain   17. Disorder of skeletal system   18. Problems influencing health status   19. Pharmacologic therapy   20. Long term current use of opiate analgesic   21. Other intervertebral disc degeneration, lumbar region   22. Chronic hip pain (Bilateral) (R>L)   23. Chronic sacroiliac joint pain (Left)   24. Chronic bilateral low back pain with bilateral sciatica   25. Neurogenic pain   26. Osteoarthritis involving multiple joints   27. Chronic knee pain (Bilateral) (R>L)     Problems updated and reviewed during this visit: Problem  Cervicalgia  Chronic Musculoskeletal Pain  Ddd (Degenerative Disc Disease), Thoracic  Ddd (Degenerative Disc Disease), Cervical  Lumbar facet arthropathy (Bilateral)  Lumbar facet syndrome (Bilateral)  Chronic low back pain (Primary Area of Pain) (Bilateral) (L>R) w/o sciatica  Spondylosis Without Myelopathy Or Radiculopathy, Cervical Region  Spondylosis Without Myelopathy Or Radiculopathy, Lumbosacral Region  Ddd (Degenerative Disc  Disease), Lumbosacral  Strain of Lumbar Paraspinal Muscle, Sequela  Chronic hip pain (Bilateral) (R>L)  Chronic sacroiliac joint pain (Left)  Neurogenic Pain  Osteoarthritis involving multiple joints  Chronic knee pain (Bilateral) (R>L)  Chronic low back pain (Bilateral) (L>R) w/ sciatica (Bilateral)  Chronic lower extremity pain ( Secondary  Area of  Pain) (Bilateral) (L>R)  Chronic neck pain (Tertiary Area of Pain) (Bilateral) (L>R)  Numbness and Tingling in Both Hands   Plan of Care  Pharmacotherapy (Medications Ordered): Meds ordered this encounter  Medications  . gabapentin (NEURONTIN) 100 MG capsule    Sig: Take 1-3 capsules (100-300 mg total) by mouth 4 (four) times daily. Follow written titration schedule.    Dispense:  360 capsule    Refill:  0    Do not place medication on "Automatic Refill". Fill one day early if pharmacy is closed on scheduled refill date.  . meloxicam (MOBIC) 15 MG tablet    Sig: Take 1 tablet (15 mg total) by mouth daily.    Dispense:  30 tablet    Refill:  0    Do not add this medication to the electronic "Automatic Refill" notification system. Patient may have prescription filled one day early if pharmacy  is closed on scheduled refill date.   Medications administered today: Teresa Mcgee had no medications administered during this visit.   Procedure Orders     LUMBAR FACET(MEDIAL BRANCH NERVE BLOCK) [facility]     LUMBAR FACET(MEDIAL BRANCH NERVE BLOCK) [facility] Lab Orders  No laboratory test(s) ordered today    Imaging Orders     DG Cervical Spine With Flex & Extend     DG Lumbar Spine Complete W/Bend     MR CERVICAL SPINE WO CONTRAST     MR LUMBAR SPINE WO CONTRAST     DG HIP UNILAT W OR W/O PELVIS 2-3 VIEWS RIGHT     DG HIP UNILAT W OR W/O PELVIS 2-3 VIEWS LEFT     DG Si Joints     DG Knee 1-2 Views Right     DG Knee 1-2 Views Left Referral Orders  No referral(s) requested today   Interventional management options: Planned, scheduled,  and/or pending:   Diagnostic bilateral lumbar facet block #1 under fluoroscopic guidance and IV sedation   Considering:   Diagnostic bilateral lumbar facet block  Possible bilateral lumbar facet RFA  Diagnostic left-sided sacroiliac joint block  Possible left-sided sacroiliac joint RFA  Diagnostic bilateral intra-articular hip joint injection  Diagnostic bilateral femoral nerve + obturator nerve block  Possible bilateral femoral nerve + obturator nerve RFA  Diagnostic left-sided LESI  Diagnostic bilateral transforaminal ESI  Diagnostic left-sided CESI  Diagnostic bilateral cervical facet block  Possible bilateral cervical facet RFA  Diagnostic bilateral intra-articular knee joint injections with local anesthetic and steroid  Possible series of 5 bilateral intra-articular Hyalgan knee injections  Diagnostic bilateral genicular nerve blocks  Possible bilateral genicular nerve RFA  Diagnostic trigger point injections    Palliative PRN treatment(s):   Diagnostic bilateral lumbar facet block    Provider-requested follow-up: Return for Procedure (w/ sedation): (B) L-FCT BLK #1.  No future appointments. Primary Care Physician: Elby Beck, FNP Location: [facility] Note by: Gaspar Cola, MD Date: 04/22/2018; Time: 9:46 AM

## 2018-04-22 ENCOUNTER — Encounter: Payer: Self-pay | Admitting: Pain Medicine

## 2018-04-22 ENCOUNTER — Ambulatory Visit: Payer: Medicare HMO | Attending: Pain Medicine | Admitting: Pain Medicine

## 2018-04-22 ENCOUNTER — Other Ambulatory Visit: Payer: Self-pay

## 2018-04-22 VITALS — BP 115/99 | HR 71 | Temp 98.2°F | Resp 18 | Ht 63.0 in | Wt 246.0 lb

## 2018-04-22 DIAGNOSIS — G4730 Sleep apnea, unspecified: Secondary | ICD-10-CM | POA: Diagnosis not present

## 2018-04-22 DIAGNOSIS — M47816 Spondylosis without myelopathy or radiculopathy, lumbar region: Secondary | ICD-10-CM | POA: Insufficient documentation

## 2018-04-22 DIAGNOSIS — Z9851 Tubal ligation status: Secondary | ICD-10-CM | POA: Insufficient documentation

## 2018-04-22 DIAGNOSIS — M5137 Other intervertebral disc degeneration, lumbosacral region: Secondary | ICD-10-CM | POA: Insufficient documentation

## 2018-04-22 DIAGNOSIS — Z9889 Other specified postprocedural states: Secondary | ICD-10-CM | POA: Insufficient documentation

## 2018-04-22 DIAGNOSIS — M5442 Lumbago with sciatica, left side: Secondary | ICD-10-CM

## 2018-04-22 DIAGNOSIS — M25562 Pain in left knee: Secondary | ICD-10-CM

## 2018-04-22 DIAGNOSIS — M79602 Pain in left arm: Secondary | ICD-10-CM

## 2018-04-22 DIAGNOSIS — M25552 Pain in left hip: Secondary | ICD-10-CM | POA: Insufficient documentation

## 2018-04-22 DIAGNOSIS — M25551 Pain in right hip: Secondary | ICD-10-CM | POA: Diagnosis not present

## 2018-04-22 DIAGNOSIS — M545 Low back pain: Secondary | ICD-10-CM | POA: Diagnosis present

## 2018-04-22 DIAGNOSIS — Z79891 Long term (current) use of opiate analgesic: Secondary | ICD-10-CM | POA: Insufficient documentation

## 2018-04-22 DIAGNOSIS — M5030 Other cervical disc degeneration, unspecified cervical region: Secondary | ICD-10-CM | POA: Diagnosis not present

## 2018-04-22 DIAGNOSIS — Z833 Family history of diabetes mellitus: Secondary | ICD-10-CM | POA: Insufficient documentation

## 2018-04-22 DIAGNOSIS — S39012S Strain of muscle, fascia and tendon of lower back, sequela: Secondary | ICD-10-CM

## 2018-04-22 DIAGNOSIS — X58XXXA Exposure to other specified factors, initial encounter: Secondary | ICD-10-CM | POA: Insufficient documentation

## 2018-04-22 DIAGNOSIS — M79605 Pain in left leg: Secondary | ICD-10-CM

## 2018-04-22 DIAGNOSIS — M5136 Other intervertebral disc degeneration, lumbar region: Secondary | ICD-10-CM | POA: Diagnosis not present

## 2018-04-22 DIAGNOSIS — S39012A Strain of muscle, fascia and tendon of lower back, initial encounter: Secondary | ICD-10-CM | POA: Insufficient documentation

## 2018-04-22 DIAGNOSIS — M79604 Pain in right leg: Secondary | ICD-10-CM

## 2018-04-22 DIAGNOSIS — I10 Essential (primary) hypertension: Secondary | ICD-10-CM | POA: Insufficient documentation

## 2018-04-22 DIAGNOSIS — G8929 Other chronic pain: Secondary | ICD-10-CM | POA: Insufficient documentation

## 2018-04-22 DIAGNOSIS — Z90710 Acquired absence of both cervix and uterus: Secondary | ICD-10-CM | POA: Insufficient documentation

## 2018-04-22 DIAGNOSIS — M533 Sacrococcygeal disorders, not elsewhere classified: Secondary | ICD-10-CM

## 2018-04-22 DIAGNOSIS — M792 Neuralgia and neuritis, unspecified: Secondary | ICD-10-CM

## 2018-04-22 DIAGNOSIS — M1711 Unilateral primary osteoarthritis, right knee: Secondary | ICD-10-CM | POA: Diagnosis not present

## 2018-04-22 DIAGNOSIS — M79601 Pain in right arm: Secondary | ICD-10-CM

## 2018-04-22 DIAGNOSIS — M47817 Spondylosis without myelopathy or radiculopathy, lumbosacral region: Secondary | ICD-10-CM | POA: Diagnosis not present

## 2018-04-22 DIAGNOSIS — M5441 Lumbago with sciatica, right side: Secondary | ICD-10-CM

## 2018-04-22 DIAGNOSIS — M25561 Pain in right knee: Secondary | ICD-10-CM

## 2018-04-22 DIAGNOSIS — K219 Gastro-esophageal reflux disease without esophagitis: Secondary | ICD-10-CM | POA: Insufficient documentation

## 2018-04-22 DIAGNOSIS — M7918 Myalgia, other site: Secondary | ICD-10-CM | POA: Insufficient documentation

## 2018-04-22 DIAGNOSIS — Z8249 Family history of ischemic heart disease and other diseases of the circulatory system: Secondary | ICD-10-CM | POA: Insufficient documentation

## 2018-04-22 DIAGNOSIS — Z823 Family history of stroke: Secondary | ICD-10-CM | POA: Insufficient documentation

## 2018-04-22 DIAGNOSIS — K449 Diaphragmatic hernia without obstruction or gangrene: Secondary | ICD-10-CM | POA: Insufficient documentation

## 2018-04-22 DIAGNOSIS — M150 Primary generalized (osteo)arthritis: Secondary | ICD-10-CM

## 2018-04-22 DIAGNOSIS — Z79899 Other long term (current) drug therapy: Secondary | ICD-10-CM | POA: Insufficient documentation

## 2018-04-22 DIAGNOSIS — M159 Polyosteoarthritis, unspecified: Secondary | ICD-10-CM

## 2018-04-22 DIAGNOSIS — Z789 Other specified health status: Secondary | ICD-10-CM

## 2018-04-22 DIAGNOSIS — M5134 Other intervertebral disc degeneration, thoracic region: Secondary | ICD-10-CM | POA: Insufficient documentation

## 2018-04-22 DIAGNOSIS — G894 Chronic pain syndrome: Secondary | ICD-10-CM

## 2018-04-22 DIAGNOSIS — Z79890 Hormone replacement therapy (postmenopausal): Secondary | ICD-10-CM | POA: Insufficient documentation

## 2018-04-22 DIAGNOSIS — F17210 Nicotine dependence, cigarettes, uncomplicated: Secondary | ICD-10-CM | POA: Diagnosis not present

## 2018-04-22 DIAGNOSIS — M47812 Spondylosis without myelopathy or radiculopathy, cervical region: Secondary | ICD-10-CM

## 2018-04-22 DIAGNOSIS — E039 Hypothyroidism, unspecified: Secondary | ICD-10-CM | POA: Insufficient documentation

## 2018-04-22 DIAGNOSIS — M542 Cervicalgia: Secondary | ICD-10-CM | POA: Insufficient documentation

## 2018-04-22 DIAGNOSIS — M899 Disorder of bone, unspecified: Secondary | ICD-10-CM

## 2018-04-22 DIAGNOSIS — M62838 Other muscle spasm: Secondary | ICD-10-CM

## 2018-04-22 MED ORDER — GABAPENTIN 100 MG PO CAPS: 100.0000 mg | ORAL_CAPSULE | Freq: Four times a day (QID) | ORAL | 0 refills | Status: DC

## 2018-04-22 MED ORDER — MELOXICAM 15 MG PO TABS: 15.0000 mg | ORAL_TABLET | Freq: Every day | ORAL | 0 refills | Status: DC

## 2018-04-22 NOTE — Progress Notes (Signed)
Safety precautions to be maintained throughout the outpatient stay will include: orient to surroundings, keep bed in low position, maintain call bell within reach at all times, provide assistance with transfer out of bed and ambulation.  

## 2018-04-22 NOTE — Patient Instructions (Addendum)
____________________________________________________________________________________________  Preparing for Procedure with Sedation  Instructions: . Oral Intake: Do not eat or drink anything for at least 8 hours prior to your procedure. . Transportation: Public transportation is not allowed. Bring an adult driver. The driver must be physically present in our waiting room before any procedure can be started. . Physical Assistance: Bring an adult physically capable of assisting you, in the event you need help. This adult should keep you company at home for at least 6 hours after the procedure. . Blood Pressure Medicine: Take your blood pressure medicine with a sip of water the morning of the procedure. . Blood thinners: Notify our staff if you are taking any blood thinners. Depending on which one you take, there will be specific instructions on how and when to stop it. . Diabetics on insulin: Notify the staff so that you can be scheduled 1st case in the morning. If your diabetes requires high dose insulin, take only  of your normal insulin dose the morning of the procedure and notify the staff that you have done so. . Preventing infections: Shower with an antibacterial soap the morning of your procedure. . Build-up your immune system: Take 1000 mg of Vitamin C with every meal (3 times a day) the day prior to your procedure. . Antibiotics: Inform the staff if you have a condition or reason that requires you to take antibiotics before dental procedures. . Pregnancy: If you are pregnant, call and cancel the procedure. . Sickness: If you have a cold, fever, or any active infections, call and cancel the procedure. . Arrival: You must be in the facility at least 30 minutes prior to your scheduled procedure. . Children: Do not bring children with you. . Dress appropriately: Bring dark clothing that you would not mind if they get stained. . Valuables: Do not bring any jewelry or valuables.  Procedure  appointments are reserved for interventional treatments only. . No Prescription Refills. . No medication changes will be discussed during procedure appointments. . No disability issues will be discussed.  Reasons to call and reschedule or cancel your procedure: (Following these recommendations will minimize the risk of a serious complication.) . Surgeries: Avoid having procedures within 2 weeks of any surgery. (Avoid for 2 weeks before or after any surgery). . Flu Shots: Avoid having procedures within 2 weeks of a flu shots or . (Avoid for 2 weeks before or after immunizations). . Barium: Avoid having a procedure within 7-10 days after having had a radiological study involving the use of radiological contrast. (Myelograms, Barium swallow or enema study). . Heart attacks: Avoid any elective procedures or surgeries for the initial 6 months after a "Myocardial Infarction" (Heart Attack). . Blood thinners: It is imperative that you stop these medications before procedures. Let us know if you if you take any blood thinner.  . Infection: Avoid procedures during or within two weeks of an infection (including chest colds or gastrointestinal problems). Symptoms associated with infections include: Localized redness, fever, chills, night sweats or profuse sweating, burning sensation when voiding, cough, congestion, stuffiness, runny nose, sore throat, diarrhea, nausea, vomiting, cold or Flu symptoms, recent or current infections. It is specially important if the infection is over the area that we intend to treat. . Heart and lung problems: Symptoms that may suggest an active cardiopulmonary problem include: cough, chest pain, breathing difficulties or shortness of breath, dizziness, ankle swelling, uncontrolled high or unusually low blood pressure, and/or palpitations. If you are experiencing any of these symptoms, cancel   your procedure and contact your primary care physician for an evaluation.  Remember:   Regular Business hours are:  Monday to Thursday 8:00 AM to 4:00 PM  Provider's Schedule: Milinda Pointer, MD:  Procedure days: Tuesday and Thursday 7:30 AM to 4:00 PM  Gillis Santa, MD:  Procedure days: Monday and Wednesday 7:30 AM to 4:00 PM ____________________________________________________________________________________________   ____________________________________________________________________________________________  Pain Scale  Introduction: The pain score used by this practice is the Verbal Numerical Rating Scale (VNRS-11). This is an 11-point scale. It is for adults and children 10 years or older. There are significant differences in how the pain score is reported, used, and applied. Forget everything you learned in the past and learn this scoring system.  General Information: The scale should reflect your current level of pain. Unless you are specifically asked for the level of your worst pain, or your average pain. If you are asked for one of these two, then it should be understood that it is over the past 24 hours.  Basic Activities of Daily Living (ADL): Personal hygiene, dressing, eating, transferring, and using restroom.  Instructions: Most patients tend to report their level of pain as a combination of two factors, their physical pain and their psychosocial pain. This last one is also known as "suffering" and it is reflection of how physical pain affects you socially and psychologically. From now on, report them separately. From this point on, when asked to report your pain level, report only your physical pain. Use the following table for reference.  Pain Clinic Pain Levels (0-5/10)  Pain Level Score  Description  No Pain 0   Mild pain 1 Nagging, annoying, but does not interfere with basic activities of daily living (ADL). Patients are able to eat, bathe, get dressed, toileting (being able to get on and off the toilet and perform personal hygiene functions),  transfer (move in and out of bed or a chair without assistance), and maintain continence (able to control bladder and bowel functions). Blood pressure and heart rate are unaffected. A normal heart rate for a healthy adult ranges from 60 to 100 bpm (beats per minute).   Mild to moderate pain 2 Noticeable and distracting. Impossible to hide from other people. More frequent flare-ups. Still possible to adapt and function close to normal. It can be very annoying and may have occasional stronger flare-ups. With discipline, patients may get used to it and adapt.   Moderate pain 3 Interferes significantly with activities of daily living (ADL). It becomes difficult to feed, bathe, get dressed, get on and off the toilet or to perform personal hygiene functions. Difficult to get in and out of bed or a chair without assistance. Very distracting. With effort, it can be ignored when deeply involved in activities.   Moderately severe pain 4 Impossible to ignore for more than a few minutes. With effort, patients may still be able to manage work or participate in some social activities. Very difficult to concentrate. Signs of autonomic nervous system discharge are evident: dilated pupils (mydriasis); mild sweating (diaphoresis); sleep interference. Heart rate becomes elevated (>115 bpm). Diastolic blood pressure (lower number) rises above 100 mmHg. Patients find relief in laying down and not moving.   Severe pain 5 Intense and extremely unpleasant. Associated with frowning face and frequent crying. Pain overwhelms the senses.  Ability to do any activity or maintain social relationships becomes significantly limited. Conversation becomes difficult. Pacing back and forth is common, as getting into a comfortable position is nearly impossible.  Pain wakes you up from deep sleep. Physical signs will be obvious: pupillary dilation; increased sweating; goosebumps; brisk reflexes; cold, clammy hands and feet; nausea, vomiting or  dry heaves; loss of appetite; significant sleep disturbance with inability to fall asleep or to remain asleep. When persistent, significant weight loss is observed due to the complete loss of appetite and sleep deprivation.  Blood pressure and heart rate becomes significantly elevated. Caution: If elevated blood pressure triggers a pounding headache associated with blurred vision, then the patient should immediately seek attention at an urgent or emergency care unit, as these may be signs of an impending stroke.    Emergency Department Pain Levels (6-10/10)  Emergency Room Pain 6 Severely limiting. Requires emergency care and should not be seen or managed at an outpatient pain management facility. Communication becomes difficult and requires great effort. Assistance to reach the emergency department may be required. Facial flushing and profuse sweating along with potentially dangerous increases in heart rate and blood pressure will be evident.   Distressing pain 7 Self-care is very difficult. Assistance is required to transport, or use restroom. Assistance to reach the emergency department will be required. Tasks requiring coordination, such as bathing and getting dressed become very difficult.   Disabling pain 8 Self-care is no longer possible. At this level, pain is disabling. The individual is unable to do even the most "basic" activities such as walking, eating, bathing, dressing, transferring to a bed, or toileting. Fine motor skills are lost. It is difficult to think clearly.   Incapacitating pain 9 Pain becomes incapacitating. Thought processing is no longer possible. Difficult to remember your own name. Control of movement and coordination are lost.   The worst pain imaginable 10 At this level, most patients pass out from pain. When this level is reached, collapse of the autonomic nervous system occurs, leading to a sudden drop in blood pressure and heart rate. This in turn results in a  temporary and dramatic drop in blood flow to the brain, leading to a loss of consciousness. Fainting is one of the body's self defense mechanisms. Passing out puts the brain in a calmed state and causes it to shut down for a while, in order to begin the healing process.    Summary: 1. Refer to this scale when providing [place] with your pain level. 2. Be accurate and careful when reporting your pain level. This will help with your care. 3. Over-reporting your pain level will lead to loss of credibility. 4. Even a level of 1/10 means that there is pain and will be treated at our facility. 5. High, inaccurate reporting will be documented as "Symptom Exaggeration", leading to loss of credibility and suspicions of possible secondary gains such as obtaining more narcotics, or wanting to appear disabled, for fraudulent reasons. 6. Only pain levels of 5 or below will be seen at our facility. 7. Pain levels of 6 and above will be sent to the Emergency Department and the appointment cancelled. ____________________________________________________________________________________________   ____________________________________________________________________________________________  Muscle Spasms & Cramps  Cause:  The most common cause of muscle spasms and cramps is vitamin and/or electrolyte (calcium, potassium, sodium, etc.) deficiencies.  Possible triggers: Sweating - causes loss of electrolytes thru the skin. Steroids - causes loss of electrolytes thru the urine.  Treatment: 1. Gatorade (or any other electrolyte-replenishing drink) - Take 1, 8 oz glass with each meal (3 times a day). 2. OTC (over-the-counter) Magnesium 400 to 500 mg - Take 1 tablet twice a day (one  with breakfast and one before bedtime). If you have kidney problems, talk to your primary care physician before taking any Magnesium. 3. Tonic Water with quinine - Take 1, 8 oz glass before bedtime.    ____________________________________________________________________________________________   ____________________________________________________________________________________________  General Risks and Possible Complications  Patient Responsibilities: It is important that you read this as it is part of your informed consent. It is our duty to inform you of the risks and possible complications associated with treatments offered to you. It is your responsibility as a patient to read this and to ask questions about anything that is not clear or that you believe was not covered in this document.  Patient's Rights: You have the right to refuse treatment. You also have the right to change your mind, even after initially having agreed to have the treatment done. However, under this last option, if you wait until the last second to change your mind, you may be charged for the materials used up to that point.  Introduction: Medicine is not an Chief Strategy Officer. Everything in Medicine, including the lack of treatment(s), carries the potential for danger, harm, or loss (which is by definition: Risk). In Medicine, a complication is a secondary problem, condition, or disease that can aggravate an already existing one. All treatments carry the risk of possible complications. The fact that a side effects or complications occurs, does not imply that the treatment was conducted incorrectly. It must be clearly understood that these can happen even when everything is done following the highest safety standards.  No treatment: You can choose not to proceed with the proposed treatment alternative. The "PRO(s)" would include: avoiding the risk of complications associated with the therapy. The "CON(s)" would include: not getting any of the treatment benefits. These benefits fall under one of three categories: diagnostic; therapeutic; and/or palliative. Diagnostic benefits include: getting information which can ultimately  lead to improvement of the disease or symptom(s). Therapeutic benefits are those associated with the successful treatment of the disease. Finally, palliative benefits are those related to the decrease of the primary symptoms, without necessarily curing the condition (example: decreasing the pain from a flare-up of a chronic condition, such as incurable terminal cancer).  General Risks and Complications: These are associated to most interventional treatments. They can occur alone, or in combination. They fall under one of the following six (6) categories: no benefit or worsening of symptoms; bleeding; infection; nerve damage; allergic reactions; and/or death. 1. No benefits or worsening of symptoms: In Medicine there are no guarantees, only probabilities. No healthcare provider can ever guarantee that a medical treatment will work, they can only state the probability that it may. Furthermore, there is always the possibility that the condition may worsen, either directly, or indirectly, as a consequence of the treatment. 2. Bleeding: This is more common if the patient is taking a blood thinner, either prescription or over the counter (example: Goody Powders, Fish oil, Aspirin, Garlic, etc.), or if suffering a condition associated with impaired coagulation (example: Hemophilia, cirrhosis of the liver, low platelet counts, etc.). However, even if you do not have one on these, it can still happen. If you have any of these conditions, or take one of these drugs, make sure to notify your treating physician. 3. Infection: This is more common in patients with a compromised immune system, either due to disease (example: diabetes, cancer, human immunodeficiency virus [HIV], etc.), or due to medications or treatments (example: therapies used to treat cancer and rheumatological diseases). However, even if  you do not have one on these, it can still happen. If you have any of these conditions, or take one of these drugs, make  sure to notify your treating physician. 4. Nerve Damage: This is more common when the treatment is an invasive one, but it can also happen with the use of medications, such as those used in the treatment of cancer. The damage can occur to small secondary nerves, or to large primary ones, such as those in the spinal cord and brain. This damage may be temporary or permanent and it may lead to impairments that can range from temporary numbness to permanent paralysis and/or brain death. 5. Allergic Reactions: Any time a substance or material comes in contact with our body, there is the possibility of an allergic reaction. These can range from a mild skin rash (contact dermatitis) to a severe systemic reaction (anaphylactic reaction), which can result in death. 6. Death: In general, any medical intervention can result in death, most of the time due to an unforeseen complication. ____________________________________________________________________________________________  ____________________________________________________________________________________________  Gabapentin Titration  Medication used: Gabapentin (Generic Name) or Neurontin (Brand Name) 100 mg tablets/capsules  Reasons to stop increasing the dose:  Reason 1: You get good relief of symptoms, in which case there is no need to increase the daily dose any further.    Reason 2: You develop some side effects, such as sleeping all of the time, difficulty concentrating, or becoming disoriented, in which case you need to go down on the dose, to the prior level, where you were not experiencing any side effects. Stay on that dose longer, to allow more time for your body to get use it, before attempting to increase it again.   Steps to increase medication: Step 1: Start by taking 1 (one) tablet at bedtime x 7 (seven) days.  Step 2: Increase dose to 2 (two) tablets at bedtime. Stay on this dose x 7 (seven) days.  Step 3: Next increase it to 3  (three) tablets at bedtime. Stay on this dose x another 7 (seven) days.  Step 4: Next, add 1 (one) tablet at noon with lunch. Continue this dose x another 7 (seven) days.  Step 5: Add 1 (one) tablet in the afternoon with dinner. Stay on this dose x another 7 (seven) days.  Step 6: At this point you should be taking the medicine 4 (four) times a day. This daily regimen of taking the medicine 4 (four) times a day, will be maintained from now on. You should not take any doses any sooner than every 6 (six) hours.  Step 7: After 7 (seven) days of taking 3 (three) tablet at bedtime, 1 (one) tablet at noon, 1 (one) tablet in the afternoon, and 1 (one) tablet in the morning, begin taking 2 (two) tablets at noon with lunch. Stay on this dose x another 7 (seven) days.   Step 8: After 7 (seven) days of taking 3 (three) tablet at bedtime, 2 (two) tablets at noon, 1 (one) tablet in the afternoon, and 1 (one) tablet in the morning, begin taking 2 (two) tablets in the afternoon with dinner. Stay on this dose x another 7 (seven) days.   Step 9: After 7 (seven) days of taking 3 (three) tablet at bedtime, 2 (two) tablets at noon, 2 (two) tablets in the afternoon, and 1 (one) tablet in the morning, begin taking 2 (two) tablets in the morning with breakfast. Stay on this dose x another 7 (seven) days. At this point  you should be taking the medicine 4 (four) times a day, or about every 6 (six) hours. This daily regimen of taking the medicine 4 (four) times a day, will be maintained from now on. You should not take any doses any sooner than every 6 (six) hours.  Step 10: After 7 (seven) days of taking 3 (three) tablet at bedtime, 2 (two) tablets at noon, 2 (two) tablets in the afternoon, and 2 (two) tablets in the morning, begin taking 3 (three) tablets at noon with lunch. Stay on this dose x another 7 (seven) days.   Step 11: After 7 (seven) days of taking 3 (three) tablet at bedtime, 3 (three) tablets at noon, 2 (two)  tablets in the afternoon, and 2 (two) tablets in the morning, begin taking 3 (three) tablets in the afternoon with dinner. Stay on this dose x another 7 (seven) days.   Step 12: After 7 (seven) days of taking 3 (three) tablet at bedtime, 3 (three) tablets at noon, 3 (three) tablets in the afternoon, and 2 (two) tablet in the morning, begin taking 3 (three) tablets in the morning with breakfast. Stay on this dose x another 7 (seven) days. At this point you should be taking the medicine 4 (four) times a day, or about every 6 (six) hours. This daily regimen of taking the medicine 4 (four) times a day, will be maintained from now on.   Endpoint: Once you have reached the maximum dose you can tolerate without side-effects, contact your physician so as to evaluate the results of the regimen.   Questions: Feel free to contact us for any questions or problems at [phone] ____________________________________________________________________________________________ GENERAL RISKS AND COMPLICATIONS  What are the risk, side effects and possible complications? Generally speaking, most procedures are safe.  However, with any procedure there are risks, side effects, and the possibility of complications.  The risks and complications are dependent upon the sites that are lesioned, or the type of nerve block to be performed.  The closer the procedure is to the spine, the more serious the risks are.  Great care is taken when placing the radio frequency needles, block needles or lesioning probes, but sometimes complications can occur. 1. Infection: Any time there is an injection through the skin, there is a risk of infection.  This is why sterile conditions are used for these blocks.  There are four possible types of infection. 1. Localized skin infection. 2. Central Nervous System Infection-This can be in the form of Meningitis, which can be deadly. 3. Epidural Infections-This can be in the form of an epidural  abscess, which can cause pressure inside of the spine, causing compression of the spinal cord with subsequent paralysis. This would require an emergency surgery to decompress, and there are no guarantees that the patient would recover from the paralysis. 4. Discitis-This is an infection of the intervertebral discs.  It occurs in about 1% of discography procedures.  It is difficult to treat and it may lead to surgery.        2. Pain: the needles have to go through skin and soft tissues, will cause soreness.       3. Damage to internal structures:  The nerves to be lesioned may be near blood vessels or    other nerves which can be potentially damaged.       4. Bleeding: Bleeding is more common if the patient is taking blood thinners such as  aspirin, Coumadin, Ticiid, Plavix, etc., or if he/she  have some genetic predisposition  such as hemophilia. Bleeding into the spinal canal can cause compression of the spinal  cord with subsequent paralysis.  This would require an emergency surgery to  decompress and there are no guarantees that the patient would recover from the  paralysis.       5. Pneumothorax:  Puncturing of a lung is a possibility, every time a needle is introduced in  the area of the chest or upper back.  Pneumothorax refers to free air around the  collapsed lung(s), inside of the thoracic cavity (chest cavity).  Another two possible  complications related to a similar event would include: Hemothorax and Chylothorax.   These are variations of the Pneumothorax, where instead of air around the collapsed  lung(s), you may have blood or chyle, respectively.       6. Spinal headaches: They may occur with any procedures in the area of the spine.       7. Persistent CSF (Cerebro-Spinal Fluid) leakage: This is a rare problem, but may occur  with prolonged intrathecal or epidural catheters either due to the formation of a fistulous  track or a dural tear.       8. Nerve damage: By working so close to the  spinal cord, there is always a possibility of  nerve damage, which could be as serious as a permanent spinal cord injury with  paralysis.       9. Death:  Although rare, severe deadly allergic reactions known as "Anaphylactic  reaction" can occur to any of the medications used.      10. Worsening of the symptoms:  We can always make thing worse.  What are the chances of something like this happening? Chances of any of this occuring are extremely low.  By statistics, you have more of a chance of getting killed in a motor vehicle accident: while driving to the hospital than any of the above occurring .  Nevertheless, you should be aware that they are possibilities.  In general, it is similar to taking a shower.  Everybody knows that you can slip, hit your head and get killed.  Does that mean that you should not shower again?  Nevertheless always keep in mind that statistics do not mean anything if you happen to be on the wrong side of them.  Even if a procedure has a 1 (one) in a 1,000,000 (million) chance of going wrong, it you happen to be that one..Also, keep in mind that by statistics, you have more of a chance of having something go wrong when taking medications.  Who should not have this procedure? If you are on a blood thinning medication (e.g. Coumadin, Plavix, see list of "Blood Thinners"), or if you have an active infection going on, you should not have the procedure.  If you are taking any blood thinners, please inform your physician.  How should I prepare for this procedure?  Do not eat or drink anything at least six hours prior to the procedure.  Bring a driver with you .  It cannot be a taxi.  Come accompanied by an adult that can drive you back, and that is strong enough to help you if your legs get weak or numb from the local anesthetic.  Take all of your medicines the morning of the procedure with just enough water to swallow them.  If you have diabetes, make sure that you are  scheduled to have your procedure done first thing in the morning,  whenever possible.  If you have diabetes, take only half of your insulin dose and notify our nurse that you have done so as soon as you arrive at the clinic.  If you are diabetic, but only take blood sugar pills (oral hypoglycemic), then do not take them on the morning of your procedure.  You may take them after you have had the procedure.  Do not take aspirin or any aspirin-containing medications, at least eleven (11) days prior to the procedure.  They may prolong bleeding.  Wear loose fitting clothing that may be easy to take off and that you would not mind if it got stained with Betadine or blood.  Do not wear any jewelry or perfume  Remove any nail coloring.  It will interfere with some of our monitoring equipment.  NOTE: Remember that this is not meant to be interpreted as a complete list of all possible complications.  Unforeseen problems may occur.  BLOOD THINNERS The following drugs contain aspirin or other products, which can cause increased bleeding during surgery and should not be taken for 2 weeks prior to and 1 week after surgery.  If you should need take something for relief of minor pain, you may take acetaminophen which is found in Tylenol,m Datril, Anacin-3 and Panadol. It is not blood thinner. The products listed below are.  Do not take any of the products listed below in addition to any listed on your instruction sheet.  A.P.C or A.P.C with Codeine Codeine Phosphate Capsules #3 Ibuprofen Ridaura  ABC compound Congesprin Imuran rimadil  Advil Cope Indocin Robaxisal  Alka-Seltzer Effervescent Pain Reliever and Antacid Coricidin or Coricidin-D  Indomethacin Rufen  Alka-Seltzer plus Cold Medicine Cosprin Ketoprofen S-A-C Tablets  Anacin Analgesic Tablets or Capsules Coumadin Korlgesic Salflex  Anacin Extra Strength Analgesic tablets or capsules CP-2 Tablets Lanoril Salicylate  Anaprox Cuprimine Capsules  Levenox Salocol  Anexsia-D Dalteparin Magan Salsalate  Anodynos Darvon compound Magnesium Salicylate Sine-off  Ansaid Dasin Capsules Magsal Sodium Salicylate  Anturane Depen Capsules Marnal Soma  APF Arthritis pain formula Dewitt's Pills Measurin Stanback  Argesic Dia-Gesic Meclofenamic Sulfinpyrazone  Arthritis Bayer Timed Release Aspirin Diclofenac Meclomen Sulindac  Arthritis pain formula Anacin Dicumarol Medipren Supac  Analgesic (Safety coated) Arthralgen Diffunasal Mefanamic Suprofen  Arthritis Strength Bufferin Dihydrocodeine Mepro Compound Suprol  Arthropan liquid Dopirydamole Methcarbomol with Aspirin Synalgos  ASA tablets/Enseals Disalcid Micrainin Tagament  Ascriptin Doan's Midol Talwin  Ascriptin A/D Dolene Mobidin Tanderil  Ascriptin Extra Strength Dolobid Moblgesic Ticlid  Ascriptin with Codeine Doloprin or Doloprin with Codeine Momentum Tolectin  Asperbuf Duoprin Mono-gesic Trendar  Aspergum Duradyne Motrin or Motrin IB Triminicin  Aspirin plain, buffered or enteric coated Durasal Myochrisine Trigesic  Aspirin Suppositories Easprin Nalfon Trillsate  Aspirin with Codeine Ecotrin Regular or Extra Strength Naprosyn Uracel  Atromid-S Efficin Naproxen Ursinus  Auranofin Capsules Elmiron Neocylate Vanquish  Axotal Emagrin Norgesic Verin  Azathioprine Empirin or Empirin with Codeine Normiflo Vitamin E  Azolid Emprazil Nuprin Voltaren  Bayer Aspirin plain, buffered or children's or timed BC Tablets or powders Encaprin Orgaran Warfarin Sodium  Buff-a-Comp Enoxaparin Orudis Zorpin  Buff-a-Comp with Codeine Equegesic Os-Cal-Gesic   Buffaprin Excedrin plain, buffered or Extra Strength Oxalid   Bufferin Arthritis Strength Feldene Oxphenbutazone   Bufferin plain or Extra Strength Feldene Capsules Oxycodone with Aspirin   Bufferin with Codeine Fenoprofen Fenoprofen Pabalate or Pabalate-SF   Buffets II Flogesic Panagesic   Buffinol plain or Extra Strength Florinal or Florinal with  Codeine Panwarfarin   Buf-Tabs Flurbiprofen Penicillamine  Butalbital Compound Four-way cold tablets Penicillin   Butazolidin Fragmin Pepto-Bismol   Carbenicillin Geminisyn Percodan   Carna Arthritis Reliever Geopen Persantine   Carprofen Gold's salt Persistin   Chloramphenicol Goody's Phenylbutazone   Chloromycetin Haltrain Piroxlcam   Clmetidine heparin Plaquenil   Cllnoril Hyco-pap Ponstel   Clofibrate Hydroxy chloroquine Propoxyphen         Before stopping any of these medications, be sure to consult the physician who ordered them.  Some, such as Coumadin (Warfarin) are ordered to prevent or treat serious conditions such as "deep thrombosis", "pumonary embolisms", and other heart problems.  The amount of time that you may need off of the medication may also vary with the medication and the reason for which you were taking it.  If you are taking any of these medications, please make sure you notify your pain physician before you undergo any procedures.         Pain Management Discharge Instructions  General Discharge Instructions :  If you need to reach your doctor call: Monday-Friday 8:00 am - 4:00 pm at [phone] or toll free [phone].  After clinic hours [phone] to have operator reach doctor.  Bring all of your medication bottles to all your appointments in the pain clinic.  To cancel or reschedule your appointment with Pain Management please remember to call 24 hours in advance to avoid a fee.  Refer to the educational materials which you have been given on: General Risks, I had my Procedure. Discharge Instructions, Post Sedation.  Post Procedure Instructions:  The drugs you were given will stay in your system until tomorrow, so for the next 24 hours you should not drive, make any legal decisions or drink any alcoholic beverages.  You may eat anything you prefer, but it is better to start with liquids then soups and crackers, and gradually work up to solid  foods.  Please notify your doctor immediately if you have any unusual bleeding, trouble breathing or pain that is not related to your normal pain.  Depending on the type of procedure that was done, some parts of your body may feel week and/or numb.  This usually clears up by tonight or the next day.  Walk with the use of an assistive device or accompanied by an adult for the 24 hours.  You may use ice on the affected area for the first 24 hours.  Put ice in a Ziploc bag and cover with a towel and place against area 15 minutes on 15 minutes off.  You may switch to heat after 24 hours.Facet Blocks Patient Information  Description: The facets are joints in the spine between the vertebrae.  Like any joints in the body, facets can become irritated and painful.  Arthritis can also effect the facets.  By injecting steroids and local anesthetic in and around these joints, we can temporarily block the nerve supply to them.  Steroids act directly on irritated nerves and tissues to reduce selling and inflammation which often leads to decreased pain.  Facet blocks may be done anywhere along the spine from the neck to the low back depending upon the location of your pain.   After numbing the skin with local anesthetic (like Novocaine), a small needle is passed onto the facet joints under x-ray guidance.  You may experience a sensation of pressure while this is being done.  The entire block usually lasts about 15-25 minutes.   Conditions which may be treated by facet blocks:   Low back/buttock pain  Neck/shoulder pain  Certain types of headaches  Preparation for the injection:  1. Do not eat any solid food or dairy products within 8 hours of your appointment. 2. You may drink clear liquid up to 3 hours before appointment.  Clear liquids include water, black coffee, juice or soda.  No milk or cream please. 3. You may take your regular medication, including pain medications, with a sip of water before  your appointment.  Diabetics should hold regular insulin (if taken separately) and take 1/2 normal NPH dose the morning of the procedure.  Carry some sugar containing items with you to your appointment. 4. A driver must accompany you and be prepared to drive you home after your procedure. 5. Bring all your current medications with you. 6. An IV may be inserted and sedation may be given at the discretion of the physician. 7. A blood pressure cuff, EKG and other monitors will often be applied during the procedure.  Some patients may need to have extra oxygen administered for a short period. 8. You will be asked to provide medical information, including your allergies and medications, prior to the procedure.  We must know immediately if you are taking blood thinners (like Coumadin/Warfarin) or if you are allergic to IV iodine contrast (dye).  We must know if you could possible be pregnant.  Possible side-effects:   Bleeding from needle site  Infection (rare, may require surgery)  Nerve injury (rare)  Numbness & tingling (temporary)  Difficulty urinating (rare, temporary)  Spinal headache (a headache worse with upright posture)  Light-headedness (temporary)  Pain at injection site (serveral days)  Decreased blood pressure (rare, temporary)  Weakness in arm/leg (temporary)  Pressure sensation in back/neck (temporary)   Call if you experience:   Fever/chills associated with headache or increased back/neck pain  Headache worsened by an upright position  New onset, weakness or numbness of an extremity below the injection site  Hives or difficulty breathing (go to the emergency room)  Inflammation or drainage at the injection site(s)  Severe back/neck pain greater than usual  New symptoms which are concerning to you  Please note:  Although the local anesthetic injected can often make your back or neck feel good for several hours after the injection, the pain will likely  return. It takes 3-7 days for steroids to work.  You may not notice any pain relief for at least one week.  If effective, we will often do a series of 2-3 injections spaced 3-6 weeks apart to maximally decrease your pain.  After the initial series, you may be a candidate for a more permanent nerve block of the facets.  If you have any questions, please call #[phone]) [facility]

## 2018-04-23 ENCOUNTER — Ambulatory Visit
Admission: RE | Admit: 2018-04-23 | Discharge: 2018-04-23 | Disposition: A | Discharge status: Home / Self Care | Payer: Medicare HMO | Source: Ambulatory Visit | Attending: Pain Medicine | Admitting: Pain Medicine

## 2018-04-23 DIAGNOSIS — M5137 Other intervertebral disc degeneration, lumbosacral region: Secondary | ICD-10-CM

## 2018-04-23 DIAGNOSIS — M25561 Pain in right knee: Secondary | ICD-10-CM | POA: Insufficient documentation

## 2018-04-23 DIAGNOSIS — M25562 Pain in left knee: Secondary | ICD-10-CM | POA: Insufficient documentation

## 2018-04-23 DIAGNOSIS — M1711 Unilateral primary osteoarthritis, right knee: Secondary | ICD-10-CM | POA: Diagnosis not present

## 2018-04-23 DIAGNOSIS — M25551 Pain in right hip: Secondary | ICD-10-CM | POA: Diagnosis not present

## 2018-04-23 DIAGNOSIS — M150 Primary generalized (osteo)arthritis: Secondary | ICD-10-CM

## 2018-04-23 DIAGNOSIS — G8929 Other chronic pain: Secondary | ICD-10-CM | POA: Insufficient documentation

## 2018-04-23 DIAGNOSIS — M79604 Pain in right leg: Secondary | ICD-10-CM | POA: Diagnosis not present

## 2018-04-23 DIAGNOSIS — M769 Unspecified enthesopathy, lower limb, excluding foot: Secondary | ICD-10-CM | POA: Diagnosis not present

## 2018-04-23 DIAGNOSIS — M47812 Spondylosis without myelopathy or radiculopathy, cervical region: Secondary | ICD-10-CM

## 2018-04-23 DIAGNOSIS — M47816 Spondylosis without myelopathy or radiculopathy, lumbar region: Secondary | ICD-10-CM | POA: Insufficient documentation

## 2018-04-23 DIAGNOSIS — M5030 Other cervical disc degeneration, unspecified cervical region: Secondary | ICD-10-CM | POA: Diagnosis not present

## 2018-04-23 DIAGNOSIS — M79602 Pain in left arm: Secondary | ICD-10-CM | POA: Insufficient documentation

## 2018-04-23 DIAGNOSIS — M25552 Pain in left hip: Secondary | ICD-10-CM | POA: Insufficient documentation

## 2018-04-23 DIAGNOSIS — M79605 Pain in left leg: Secondary | ICD-10-CM

## 2018-04-23 DIAGNOSIS — M47817 Spondylosis without myelopathy or radiculopathy, lumbosacral region: Secondary | ICD-10-CM

## 2018-04-23 DIAGNOSIS — M79601 Pain in right arm: Secondary | ICD-10-CM

## 2018-04-23 DIAGNOSIS — M545 Low back pain: Secondary | ICD-10-CM

## 2018-04-23 DIAGNOSIS — M159 Polyosteoarthritis, unspecified: Secondary | ICD-10-CM

## 2018-04-23 DIAGNOSIS — M542 Cervicalgia: Secondary | ICD-10-CM

## 2018-05-07 ENCOUNTER — Encounter: Payer: Self-pay | Admitting: Pain Medicine

## 2018-05-07 ENCOUNTER — Ambulatory Visit
Admission: RE | Admit: 2018-05-07 | Discharge: 2018-05-07 | Disposition: A | Discharge status: Home / Self Care | Payer: Medicare HMO | Source: Ambulatory Visit | Attending: Pain Medicine | Admitting: Pain Medicine

## 2018-05-07 ENCOUNTER — Other Ambulatory Visit: Payer: Self-pay

## 2018-05-07 ENCOUNTER — Ambulatory Visit (HOSPITAL_BASED_OUTPATIENT_CLINIC_OR_DEPARTMENT_OTHER): Payer: Medicare HMO | Admitting: Pain Medicine

## 2018-05-07 VITALS — BP 148/71 | HR 60 | Temp 97.6°F | Resp 10 | Ht 63.0 in | Wt 246.0 lb

## 2018-05-07 DIAGNOSIS — M47817 Spondylosis without myelopathy or radiculopathy, lumbosacral region: Secondary | ICD-10-CM | POA: Diagnosis present

## 2018-05-07 DIAGNOSIS — M545 Low back pain, unspecified: Secondary | ICD-10-CM

## 2018-05-07 DIAGNOSIS — G8929 Other chronic pain: Secondary | ICD-10-CM

## 2018-05-07 DIAGNOSIS — M47816 Spondylosis without myelopathy or radiculopathy, lumbar region: Secondary | ICD-10-CM

## 2018-05-07 MED ORDER — TRIAMCINOLONE ACETONIDE 40 MG/ML IJ SUSP
80.0000 mg | Freq: Once | INTRAMUSCULAR | Status: AC
  Administered 2018-05-07: 80 mg
  Filled 2018-05-07: qty 2

## 2018-05-07 MED ORDER — MIDAZOLAM HCL 5 MG/5ML IJ SOLN
1.0000 mg | INTRAMUSCULAR | Status: DC | PRN
  Administered 2018-05-07: 2 mg via INTRAVENOUS
  Filled 2018-05-07: qty 5

## 2018-05-07 MED ORDER — LACTATED RINGERS IV SOLN
1000.0000 mL | Freq: Once | INTRAVENOUS | Status: AC
  Administered 2018-05-07: 1000 mL via INTRAVENOUS

## 2018-05-07 MED ORDER — ROPIVACAINE HCL 2 MG/ML IJ SOLN
18.0000 mL | Freq: Once | INTRAMUSCULAR | Status: AC
  Administered 2018-05-07: 18 mL via PERINEURAL
  Filled 2018-05-07: qty 20

## 2018-05-07 MED ORDER — FENTANYL CITRATE (PF) 100 MCG/2ML IJ SOLN
25.0000 ug | INTRAMUSCULAR | Status: DC | PRN
  Administered 2018-05-07: 50 ug via INTRAVENOUS
  Filled 2018-05-07: qty 2

## 2018-05-07 MED ORDER — LIDOCAINE HCL 2 % IJ SOLN
20.0000 mL | Freq: Once | INTRAMUSCULAR | Status: AC
  Administered 2018-05-07: 400 mg
  Filled 2018-05-07: qty 40

## 2018-05-07 NOTE — Progress Notes (Signed)
Patient's Name: Teresa Mcgee  MRN: [id number]  Referring Provider: Milinda Pointer, MD  DOB: 11-29-1965  PCP: Elby Beck, FNP  DOS: 05/07/2018  Note by: Gaspar Cola, MD  Service setting: Ambulatory outpatient  Specialty: Interventional Pain Management  Patient type: Established  Location: [facility] (AMB) Pain Management Facility  Visit type: Interventional Procedure   Primary Reason for Visit: Interventional Pain Management Treatment. CC: Back Pain (lower)  Procedure:          Anesthesia, Analgesia, Anxiolysis:  Type: Lumbar Facet, Medial Branch Block(s) #1  Primary Purpose: Diagnostic Region: Posterolateral Lumbosacral Spine Level: L2, L3, L4, L5, & S1 Medial Branch Level(s). Injecting these levels blocks the L3-4, L4-5, and L5-S1 lumbar facet joints. Laterality: Bilateral  Type: Moderate (Conscious) Sedation combined with Local Anesthesia Indication(s): Analgesia and Anxiety Route: Intravenous (IV) IV Access: Secured Sedation: Meaningful verbal contact was maintained at all times during the procedure  Local Anesthetic: Lidocaine 1-2%   Position: Prone   Indications: 1. Spondylosis without myelopathy or radiculopathy, lumbosacral region   2. Lumbar facet syndrome (Bilateral)   3. Lumbar facet arthropathy (Bilateral)   4. Chronic low back pain (Primary Area of Pain) (Bilateral) (L>R) w/o sciatica    Pain Score: Pre-procedure: 5 /10 Post-procedure: 0-No pain/10  Pre-op Assessment:  [name] is a 52 y.o. (year old), female patient, seen today for interventional treatment. She  has a past surgical history that includes Thyroidectomy (1998); cesarian; Partial hysterectomy (2000); Cesarean section; Tubal ligation; and Tooth extraction. [name] has a current medication list which includes the following prescription(s): amlodipine, gnp calcium 1200, carboxymethylcellulose, cetirizine, fluticasone, gabapentin, hydrochlorothiazide, ketotifen, levothyroxine, magnesium,  meloxicam, and refresh celluvisc, and the following Facility-Administered Medications: fentanyl and midazolam. Her primarily concern today is the Back Pain (lower)  Initial Vital Signs:  Pulse/HCG Rate: 64ECG Heart Rate: 61 Temp: 98.2 F (36.8 C) Resp: 18 BP: 122/86 SpO2: 99 %  BMI: Estimated body mass index is 43.58 kg/m as calculated from the following:   Height as of this encounter: 5\' 3"  (1.6 m).   Weight as of this encounter: 246 lb (111.6 kg).  Risk Assessment: Allergies: Reviewed. She has No Known Allergies.  Allergy Precautions: None required Coagulopathies: Reviewed. None identified.  Blood-thinner therapy: None at this time Active Infection(s): Reviewed. None identified. [name] is afebrile  Site Confirmation: [name] was asked to confirm the procedure and laterality before marking the site Procedure checklist: Completed Consent: Before the procedure and under the influence of no sedative(s), amnesic(s), or anxiolytics, the patient was informed of the treatment options, risks and possible complications. To fulfill our ethical and legal obligations, as recommended by the American Medical Association's Code of Ethics, I have informed the patient of my clinical impression; the nature and purpose of the treatment or procedure; the risks, benefits, and possible complications of the intervention; the alternatives, including doing nothing; the risk(s) and benefit(s) of the alternative treatment(s) or procedure(s); and the risk(s) and benefit(s) of doing nothing. The patient was provided information about the general risks and possible complications associated with the procedure. These may include, but are not limited to: failure to achieve desired goals, infection, bleeding, organ or nerve damage, allergic reactions, paralysis, and death. In addition, the patient was informed of those risks and complications associated to Spine-related procedures, such as failure to decrease pain;  infection (i.e.: Meningitis, epidural or intraspinal abscess); bleeding (i.e.: epidural hematoma, subarachnoid hemorrhage, or any other type of intraspinal or peri-dural bleeding); organ or nerve damage (  i.e.: Any type of peripheral nerve, nerve root, or spinal cord injury) with subsequent damage to sensory, motor, and/or autonomic systems, resulting in permanent pain, numbness, and/or weakness of one or several areas of the body; allergic reactions; (i.e.: anaphylactic reaction); and/or death. Furthermore, the patient was informed of those risks and complications associated with the medications. These include, but are not limited to: allergic reactions (i.e.: anaphylactic or anaphylactoid reaction(s)); adrenal axis suppression; blood sugar elevation that in diabetics may result in ketoacidosis or comma; water retention that in patients with history of congestive heart failure may result in shortness of breath, pulmonary edema, and decompensation with resultant heart failure; weight gain; swelling or edema; medication-induced neural toxicity; particulate matter embolism and blood vessel occlusion with resultant organ, and/or nervous system infarction; and/or aseptic necrosis of one or more joints. Finally, the patient was informed that Medicine is not an exact science; therefore, there is also the possibility of unforeseen or unpredictable risks and/or possible complications that may result in a catastrophic outcome. The patient indicated having understood very clearly. We have given the patient no guarantees and we have made no promises. Enough time was given to the patient to ask questions, all of which were answered to the patient's satisfaction. [name] has indicated that she wanted to continue with the procedure. Attestation: I, the ordering provider, attest that I have discussed with the patient the benefits, risks, side-effects, alternatives, likelihood of achieving goals, and potential problems during  recovery for the procedure that I have provided informed consent. Date  Time: 05/07/2018 10:47 AM  Pre-Procedure Preparation:  Monitoring: As per clinic protocol. Respiration, ETCO2, SpO2, BP, heart rate and rhythm monitor placed and checked for adequate function Safety Precautions: Patient was assessed for positional comfort and pressure points before starting the procedure. Time-out: I initiated and conducted the "Time-out" before starting the procedure, as per protocol. The patient was asked to participate by confirming the accuracy of the "Time Out" information. Verification of the correct person, site, and procedure were performed and confirmed by me, the nursing staff, and the patient. "Time-out" conducted as per Joint Commission's Universal Protocol (UP.01.01.01). Time: 9924  Description of Procedure:          Laterality: Bilateral. The procedure was performed in identical fashion on both sides. Levels:  L2, L3, L4, L5, & S1 Medial Branch Level(s) Area Prepped: Posterior Lumbosacral Region Prepping solution: ChloraPrep (2% chlorhexidine gluconate and 70% isopropyl alcohol) Safety Precautions: Aspiration looking for blood return was conducted prior to all injections. At no point did we inject any substances, as a needle was being advanced. Before injecting, the patient was told to immediately notify me if she was experiencing any new onset of "ringing in the ears, or metallic taste in the mouth". No attempts were made at seeking any paresthesias. Safe injection practices and needle disposal techniques used. Medications properly checked for expiration dates. SDV (single dose vial) medications used. After the completion of the procedure, all disposable equipment used was discarded in the proper designated medical waste containers. Local Anesthesia: Protocol guidelines were followed. The patient was positioned over the fluoroscopy table. The area was prepped in the usual manner. The time-out was  completed. The target area was identified using fluoroscopy. A 12-in long, straight, sterile hemostat was used with fluoroscopic guidance to locate the targets for each level blocked. Once located, the skin was marked with an approved surgical skin marker. Once all sites were marked, the skin (epidermis, dermis, and hypodermis), as well as deeper  tissues (fat, connective tissue and muscle) were infiltrated with a small amount of a short-acting local anesthetic, loaded on a 10cc syringe with a 25G, 1.5-in  Needle. An appropriate amount of time was allowed for local anesthetics to take effect before proceeding to the next step. Local Anesthetic: Lidocaine 2.0% The unused portion of the local anesthetic was discarded in the proper designated containers. Technical explanation of process:  L2 Medial Branch Nerve Block (MBB): The target area for the L2 medial branch is at the junction of the postero-lateral aspect of the superior articular process and the superior, posterior, and medial edge of the transverse process of L3. Under fluoroscopic guidance, a Quincke needle was inserted until contact was made with os over the superior postero-lateral aspect of the pedicular shadow (target area). After negative aspiration for blood, 0.5 mL of the nerve block solution was injected without difficulty or complication. The needle was removed intact. L3 Medial Branch Nerve Block (MBB): The target area for the L3 medial branch is at the junction of the postero-lateral aspect of the superior articular process and the superior, posterior, and medial edge of the transverse process of L4. Under fluoroscopic guidance, a Quincke needle was inserted until contact was made with os over the superior postero-lateral aspect of the pedicular shadow (target area). After negative aspiration for blood, 0.5 mL of the nerve block solution was injected without difficulty or complication. The needle was removed intact. L4 Medial Branch Nerve Block  (MBB): The target area for the L4 medial branch is at the junction of the postero-lateral aspect of the superior articular process and the superior, posterior, and medial edge of the transverse process of L5. Under fluoroscopic guidance, a Quincke needle was inserted until contact was made with os over the superior postero-lateral aspect of the pedicular shadow (target area). After negative aspiration for blood, 0.5 mL of the nerve block solution was injected without difficulty or complication. The needle was removed intact. L5 Medial Branch Nerve Block (MBB): The target area for the L5 medial branch is at the junction of the postero-lateral aspect of the superior articular process and the superior, posterior, and medial edge of the sacral ala. Under fluoroscopic guidance, a Quincke needle was inserted until contact was made with os over the superior postero-lateral aspect of the pedicular shadow (target area). After negative aspiration for blood, 0.5 mL of the nerve block solution was injected without difficulty or complication. The needle was removed intact. S1 Medial Branch Nerve Block (MBB): The target area for the S1 medial branch is at the posterior and inferior 6 o'clock position of the L5-S1 facet joint. Under fluoroscopic guidance, the Quincke needle inserted for the L5 MBB was redirected until contact was made with os over the inferior and postero aspect of the sacrum, at the 6 o' clock position under the L5-S1 facet joint (Target area). After negative aspiration for blood, 0.5 mL of the nerve block solution was injected without difficulty or complication. The needle was removed intact. Procedural Needles: 22-gauge, 3.5-inch, Quincke needles used for all levels. Nerve block solution: 0.2% PF-Ropivacaine + Triamcinolone (40 mg/mL) diluted to a final concentration of 4 mg of Triamcinolone/mL of Ropivacaine The unused portion of the solution was discarded in the proper designated containers.  Once the  entire procedure was completed, the treated area was cleaned, making sure to leave some of the prepping solution back to take advantage of its long term bactericidal properties.   Illustration of the posterior view of the lumbar spine  and the posterior neural structures. Laminae of L2 through S1 are labeled. DPRL5, dorsal primary ramus of L5; DPRS1, dorsal primary ramus of S1; DPR3, dorsal primary ramus of L3; FJ, facet (zygapophyseal) joint L3-L4; I, inferior articular process of L4; LB1, lateral branch of dorsal primary ramus of L1; IAB, inferior articular branches from L3 medial branch (supplies L4-L5 facet joint); IBP, intermediate branch plexus; MB3, medial branch of dorsal primary ramus of L3; NR3, third lumbar nerve root; S, superior articular process of L5; SAB, superior articular branches from L4 (supplies L4-5 facet joint also); TP3, transverse process of L3.  Vitals:   05/07/18 1143 05/07/18 1153 05/07/18 1203 05/07/18 1213  BP: (!) 141/92 (!) 143/104 (!) 147/107 (!) 148/71  Pulse:  60 60   Resp: 14 17 13 10   Temp:  97.6 F (36.4 C)    TempSrc:  Tympanic    SpO2: 95% 96% 99% 97%  Weight:      Height:         Start Time: 1131 hrs. End Time: 1143 hrs.  Imaging Guidance (Spinal):          Type of Imaging Technique: Fluoroscopy Guidance (Spinal) Indication(s): Assistance in needle guidance and placement for procedures requiring needle placement in or near specific anatomical locations not easily accessible without such assistance. Exposure Time: Please see nurses notes. Contrast: None used. Fluoroscopic Guidance: I was personally present during the use of fluoroscopy. "Tunnel Vision Technique" used to obtain the best possible view of the target area. Parallax error corrected before commencing the procedure. "Direction-depth-direction" technique used to introduce the needle under continuous pulsed fluoroscopy. Once target was reached, antero-posterior, oblique, and lateral  fluoroscopic projection used confirm needle placement in all planes. Images permanently stored in EMR. Interpretation: No contrast injected. I personally interpreted the imaging intraoperatively. Adequate needle placement confirmed in multiple planes. Permanent images saved into the patient's record.  Antibiotic Prophylaxis:   Anti-infectives (From admission, onward)   None     Indication(s): None identified  Post-operative Assessment:  Post-procedure Vital Signs:  Pulse/HCG Rate: 60(!) 58 Temp: 97.6 F (36.4 C) Resp: 10 BP: (!) 148/71 SpO2: 97 %  EBL: None  Complications: No immediate post-treatment complications observed by team, or reported by patient.  Note: The patient tolerated the entire procedure well. A repeat set of vitals were taken after the procedure and the patient was kept under observation following institutional policy, for this type of procedure. Post-procedural neurological assessment was performed, showing return to baseline, prior to discharge. The patient was provided with post-procedure discharge instructions, including a section on how to identify potential problems. Should any problems arise concerning this procedure, the patient was given instructions to immediately contact us, at any time, without hesitation. In any case, we plan to contact the patient by telephone for a follow-up status report regarding this interventional procedure.  Comments:  No additional relevant information.  Plan of Care    Imaging Orders     DG C-Arm 1-60 Min-No Report  Procedure Orders     LUMBAR FACET(MEDIAL BRANCH NERVE BLOCK) MBNB  Medications ordered for procedure: Meds ordered this encounter  Medications  . lidocaine (XYLOCAINE) 2 % (with pres) injection 400 mg  . midazolam (VERSED) 5 MG/5ML injection 1-2 mg    Make sure Flumazenil is available in the pyxis when using this medication. If oversedation occurs, administer 0.2 mg IV over 15 sec. If after 45 sec no  response, administer 0.2 mg again over 1 min; may repeat at 1  min intervals; not to exceed 4 doses (1 mg)  . fentaNYL (SUBLIMAZE) injection 25-50 mcg    Make sure Narcan is available in the pyxis when using this medication. In the event of respiratory depression (RR< 8/min): Titrate NARCAN (naloxone) in increments of 0.1 to 0.2 mg IV at 2-3 minute intervals, until desired degree of reversal.  . lactated ringers infusion 1,000 mL  . ropivacaine (PF) 2 mg/mL (0.2%) (NAROPIN) injection 18 mL  . triamcinolone acetonide (KENALOG-40) injection 80 mg   Medications administered: We administered lidocaine, midazolam, fentaNYL, lactated ringers, ropivacaine (PF) 2 mg/mL (0.2%), and triamcinolone acetonide.  See the medical record for exact dosing, route, and time of administration.  New Prescriptions   No medications on file   Disposition: Discharge home  Discharge Date & Time: 05/07/2018; 1215 hrs.   Physician-requested Follow-up: Return for post-procedure eval (2 wks), w/ Dr. Dossie Arbour.  Future Appointments  Date Time Provider [place]  05/27/2018 11:15 AM Milinda Pointer, MD [facility] None   Primary Care Physician: Elby Beck, FNP Location: [facility] Note by: Gaspar Cola, MD Date: 05/07/2018; Time: 12:37 PM  Disclaimer:  Medicine is not an exact science. The only guarantee in medicine is that nothing is guaranteed. It is important to note that the decision to proceed with this intervention was based on the information collected from the patient. The Data and conclusions were drawn from the patient's questionnaire, the interview, and the physical examination. Because the information was provided in large part by the patient, it cannot be guaranteed that it has not been purposely or unconsciously manipulated. Every effort has been made to obtain as much relevant data as possible for this evaluation. It is important to note that the  conclusions that lead to this procedure are derived in large part from the available data. Always take into account that the treatment will also be dependent on availability of resources and existing treatment guidelines, considered by other Pain Management Practitioners as being common knowledge and practice, at the time of the intervention. For Medico-Legal purposes, it is also important to point out that variation in procedural techniques and pharmacological choices are the acceptable norm. The indications, contraindications, technique, and results of the above procedure should only be interpreted and judged by a Board-Certified Interventional Pain Specialist with extensive familiarity and expertise in the same exact procedure and technique.

## 2018-05-07 NOTE — Progress Notes (Signed)
Safety precautions to be maintained throughout the outpatient stay will include: orient to surroundings, keep bed in low position, maintain call bell within reach at all times, provide assistance with transfer out of bed and ambulation.  

## 2018-05-07 NOTE — Patient Instructions (Signed)

## 2018-05-08 ENCOUNTER — Telehealth: Payer: Self-pay | Admitting: *Deleted

## 2018-05-08 NOTE — Telephone Encounter (Signed)
Voicemail left for patient to call our office if there are questions or concern re; procedure on yesterday.

## 2018-05-13 ENCOUNTER — Encounter: Payer: Self-pay | Admitting: Internal Medicine

## 2018-05-13 DIAGNOSIS — G4719 Other hypersomnia: Secondary | ICD-10-CM

## 2018-05-13 DIAGNOSIS — G4733 Obstructive sleep apnea (adult) (pediatric): Secondary | ICD-10-CM | POA: Diagnosis not present

## 2018-05-14 ENCOUNTER — Other Ambulatory Visit: Payer: Self-pay | Admitting: Pain Medicine

## 2018-05-14 ENCOUNTER — Telehealth: Payer: Self-pay

## 2018-05-14 DIAGNOSIS — M159 Polyosteoarthritis, unspecified: Secondary | ICD-10-CM

## 2018-05-14 DIAGNOSIS — G4733 Obstructive sleep apnea (adult) (pediatric): Secondary | ICD-10-CM

## 2018-05-14 DIAGNOSIS — M150 Primary generalized (osteo)arthritis: Principal | ICD-10-CM

## 2018-05-14 NOTE — Telephone Encounter (Signed)
Called and spoke to patient regarding sleep study. Orders placed for CPAP and ONO once CPAP established.   Auto-CPAP, pressure 5-20cm H2O.

## 2018-05-23 NOTE — Progress Notes (Signed)
Patient's Name: Teresa Mcgee  MRN: [id number]  Referring Provider: Elby Beck, FNP  DOB: July 05, 1966  PCP: Elby Beck, FNP  DOS: 05/27/2018  Note by: Gaspar Cola, MD  Service setting: Ambulatory outpatient  Specialty: Interventional Pain Management  Location: [facility] (AMB) Pain Management Facility    Patient type: Established   Primary Reason(s) for Visit: Encounter for post-procedure evaluation of chronic illness with mild to moderate exacerbation CC: Back Pain (right, lower)  HPI  Teresa Mcgee is a 52 y.o. year old, female patient, who comes today for a post-procedure evaluation. She has Hypothyroidism; HYPERCHOLESTEROLEMIA; TOBACCO ABUSE; DEPRESSION; HYPERTENSION, BENIGN ESSENTIAL; GERD; Sleep apnea; Fatigue; Nasal septal perforation; Muscle spasms of neck; Nasal mucositis (ulcerative); Numbness and tingling in both hands; Xerotic eczema; Obesity (BMI 35.0-39.9 without comorbidity); Constipation; Chest pain; Impaired glucose tolerance; Osteopathic Exam Findings (Somatic Dysfunction); Chronic low back pain (Bilateral) (L>R) w/ sciatica (Bilateral); Chronic lower extremity pain ( Secondary  Area of  Pain) (Bilateral) (L>R); Chronic neck pain (Tertiary Area of Pain) (Bilateral) (L>R); Chronic pain syndrome; Long term current use of opiate analgesic; Pharmacologic therapy; Disorder of skeletal system; Problems influencing health status; Chronic upper extremity pain (Fourth Area of Pain) (Bilateral) (L>R); Vitamin D deficiency; Cervicalgia; Chronic musculoskeletal pain; DDD (degenerative disc disease), thoracic; DDD (degenerative disc disease), cervical; Lumbar facet arthropathy (Bilateral); Lumbar facet syndrome (Bilateral); Chronic low back pain (Primary Area of Pain) (Bilateral) (L>R) w/o sciatica; Spondylosis without myelopathy or radiculopathy, cervical region; Spondylosis without myelopathy or radiculopathy, lumbosacral region; DDD (degenerative disc disease), lumbosacral; Strain  of lumbar paraspinal muscle, sequela; Chronic hip pain (Bilateral) (R>L); Chronic sacroiliac joint pain (Left); Neurogenic pain; Osteoarthritis involving multiple joints; and Chronic knee pain (Bilateral) (R>L) on their problem list. Her primarily concern today is the Back Pain (right, lower)  Pain Assessment: Location: Right, Lower Back Radiating: front of body Onset: More than a month ago Duration: Chronic pain Quality: Sharp Severity: 5 /10 (subjective, self-reported pain score)  Note: Reported level is inconsistent with clinical observations. Clinically the patient looks like a 2/10 A 2/10 is viewed as "Mild to Moderate" and described as noticeable and distracting. Impossible to hide from other people. More frequent flare-ups. Still possible to adapt and function close to normal. It can be very annoying and may have occasional stronger flare-ups. With discipline, patients may get used to it and adapt. Information on the proper use of the pain scale provided to the patient today. When using our objective Pain Scale, levels between 6 and 10/10 are said to belong in an emergency room, as it progressively worsens from a 6/10, described as severely limiting, requiring emergency care not usually available at an outpatient pain management facility. At a 6/10 level, communication becomes difficult and requires great effort. Assistance to reach the emergency department may be required. Facial flushing and profuse sweating along with potentially dangerous increases in heart rate and blood pressure will be evident. Timing: Constant Modifying factors: lying flat on back BP: 137/88  HR: 76  [name] comes in today for post-procedure evaluation.  Further details on both, my assessment(s), as well as the proposed treatment plan, please see below.  Post-Procedure Assessment  05/14/2018 Procedure: Diagnostic bilateral lumbar facet block #1 under fluoroscopic guidance and IV sedation Pre-procedure pain  score:  5/10 Post-procedure pain score: 0/10 (100% relief) Influential Factors: BMI: 43.58 kg/m Intra-procedural challenges: None observed.         Assessment challenges: None detected.  Reported side-effects: None.        Post-procedural adverse reactions or complications: None reported         Sedation: Sedation provided. When no sedatives are used, the analgesic levels obtained are directly associated to the effectiveness of the local anesthetics. However, when sedation is provided, the level of analgesia obtained during the initial 1 hour following the intervention, is believed to be the result of a combination of factors. These factors may include, but are not limited to: 1. The effectiveness of the local anesthetics used. 2. The effects of the analgesic(s) and/or anxiolytic(s) used. 3. The degree of discomfort experienced by the patient at the time of the procedure. 4. The patients ability and reliability in recalling and recording the events. 5. The presence and influence of possible secondary gains and/or psychosocial factors. Reported result: Relief experienced during the 1st hour after the procedure: 100 % (Ultra-Short Term Relief) Teresa Mcgee has indicated area to have been numb during this time. Interpretative annotation: Clinically appropriate result. Analgesia during this period is likely to be Local Anesthetic and/or IV Sedative (Analgesic/Anxiolytic) related.          Effects of local anesthetic: The analgesic effects attained during this period are directly associated to the localized infiltration of local anesthetics and therefore cary significant diagnostic value as to the etiological location, or anatomical origin, of the pain. Expected duration of relief is directly dependent on the pharmacodynamics of the local anesthetic used. Long-acting (4-6 hours) anesthetics used.  Reported result: Relief during the next 4 to 6 hour after the procedure: 100 % (Short-Term  Relief) [name] has indicated area to have been numb during this time. Interpretative annotation: Clinically appropriate result. Analgesia during this period is likely to be Local Anesthetic-related.          Long-term benefit: Defined as the period of time past the expected duration of local anesthetics (1 hour for short-acting and 4-6 hours for long-acting). With the possible exception of prolonged sympathetic blockade from the local anesthetics, benefits during this period are typically attributed to, or associated with, other factors such as analgesic sensory neuropraxia, antiinflammatory effects, or beneficial biochemical changes provided by agents other than the local anesthetics.  Reported result: Extended relief following procedure: 100 %(lasted 3 days) (Long-Term Relief)            Interpretative annotation: Clinically possible results. Good relief. No permanent benefit expected. Inflammation plays a part in the etiology to the pain.          Current benefits: Defined as reported results that persistent at this point in time.   Analgesia: 25-50 % Ms. Kosloski reports improvement of axial and dermatomal symptoms. Function: Somewhat improved ROM: Somewhat improved Interpretative annotation: Recurrence of symptoms. No permanent benefit expected. Effective diagnostic intervention.          Interpretation: Results would suggest a successful diagnostic intervention.                  Plan:  Proceed with diagnostic procedure No.: 2          Laboratory Chemistry  Inflammation Markers (CRP: Acute Phase) (ESR: Chronic Phase) Lab Results  Component Value Date   CRP 5.5 (H) 01/24/2018   ESRSEDRATE 40 01/24/2018                         Renal Markers Lab Results  Component Value Date   BUN 12 05/25/2018   CREATININE 0.99 05/25/2018  BCR 8 (L) 01/24/2018   GFRAA >60 05/25/2018   GFRNONAA >60 05/25/2018                             Hepatic Markers Lab Results  Component Value Date    AST 61 (H) 05/25/2018   ALT 64 (H) 05/25/2018   ALBUMIN 4.3 05/25/2018                        Neuropathy Markers Lab Results  Component Value Date   VITAMINB12 356 01/24/2018   HGBA1C 6.5 (H) 11/16/2017   HIV NON REACTIVE 03/21/2011                        Hematology Parameters Lab Results  Component Value Date   PLT 238 05/25/2018   HGB 14.5 05/25/2018   HCT 44.0 05/25/2018                        CV Markers Lab Results  Component Value Date   CKTOTAL 586 (H) 01/11/2012   CKMB 5.2 (H) 01/11/2012   TROPONINI 0.03 ([place]) 05/25/2018                         Note: Lab results reviewed.  Recent Imaging Results   Results for orders placed in visit on 05/07/18  DG C-Arm 1-60 Min-No Report   Narrative Fluoroscopy was utilized by the requesting physician.  No radiographic  interpretation.    Interpretation Report: Fluoroscopy was used during the procedure to assist with needle guidance. The images were interpreted intraoperatively by the requesting physician.  Meds   Current Outpatient Medications:  .  amLODipine (NORVASC) 10 MG tablet, Take 1 tablet (10 mg total) by mouth daily., Disp: 90 tablet, Rfl: 3 .  Calcium Carbonate-Vit D-Min (GNP CALCIUM 1200) 1200-1000 MG-UNIT CHEW, Chew 1,200 mg by mouth daily with breakfast. Take in combination with vitamin D and magnesium., Disp: 30 tablet, Rfl: 5 .  carboxymethylcellulose 1 % ophthalmic solution, Apply 1 drop to eye 3 (three) times daily., Disp: 30 mL, Rfl: 12 .  cetirizine (ZYRTEC) 10 MG tablet, Take 1 tablet (10 mg total) by mouth daily., Disp: 90 tablet, Rfl: 3 .  diclofenac sodium (VOLTAREN) 1 % GEL, Use to the affected area up to 4 times a day as needed for pain., Disp: 100 g, Rfl: 0 .  fluticasone (FLONASE) 50 MCG/ACT nasal spray, Place 2 sprays into both nostrils daily., Disp: 16 g, Rfl: 6 .  hydrochlorothiazide (HYDRODIURIL) 25 MG tablet, Take 1 tablet (25 mg total) by mouth daily. as directed, Disp: 90 tablet, Rfl: 3 .   ketotifen (ZADITOR) 0.025 % ophthalmic solution, Place 1 drop into both eyes 2 (two) times daily., Disp: 5 mL, Rfl: 0 .  levothyroxine (SYNTHROID, LEVOTHROID) 100 MCG tablet, TAKE 1 TABLET BY MOUTH EVERY DAY, Disp: 90 tablet, Rfl: 1 .  Magnesium 500 MG CAPS, Take 1 capsule (500 mg total) by mouth 2 (two) times daily at 8 am and 10 pm., Disp: 60 capsule, Rfl: 5 .  REFRESH CELLUVISC 1 % GEL, APPLY 1 DROP TO EYE 3 (THREE) TIMES DAILY., Disp: , Rfl: 12 .  gabapentin (NEURONTIN) 100 MG capsule, Take 1-3 capsules (100-300 mg total) by mouth 4 (four) times daily. Follow written titration schedule., Disp: 360 capsule, Rfl: 0 .  meloxicam (MOBIC) 15 MG  tablet, Take 1 tablet (15 mg total) by mouth daily., Disp: 30 tablet, Rfl: 0 .  methocarbamol (ROBAXIN) 750 MG tablet, Take 1 tablet (750 mg total) by mouth every 8 (eight) hours as needed for muscle spasms., Disp: 90 tablet, Rfl: 0  ROS  Constitutional: Denies any fever or chills Gastrointestinal: No reported hemesis, hematochezia, vomiting, or acute GI distress Musculoskeletal: Denies any acute onset joint swelling, redness, loss of ROM, or weakness Neurological: No reported episodes of acute onset apraxia, aphasia, dysarthria, agnosia, amnesia, paralysis, loss of coordination, or loss of consciousness  Allergies  Ms. Dragos has No Known Allergies.  [place]  Drug: Ms. Cervantez  reports that she does not use drugs. Alcohol:  reports that she does not drink alcohol. Tobacco:  reports that she has been smoking. She has smoked for the past 20.00 years. She has never used smokeless tobacco. Medical:  has a past medical history of Arthritis, Bell's palsy (may 2012), Chest pain, GERD (gastroesophageal reflux disease), H/O hiatal hernia, Hyperlipidemia, Hypertension, Hypothyroidism, Shingles, Shortness of breath, and Sleep apnea. Surgical: Ms. Labra  has a past surgical history that includes Thyroidectomy (1998); cesarian; Partial hysterectomy (2000); Cesarean  section; Tubal ligation; and Tooth extraction. Family: family history includes Diabetes in her brother and sister; Heart attack (age of onset: 30) in her brother; Heart attack (age of onset: 23) in her sister; Heart attack (age of onset: 6) in her father; Stroke (age of onset: 56) in her brother; Stroke (age of onset: 34) in her sister.  Constitutional Exam  General appearance: Well nourished, well developed, and well hydrated. In no apparent acute distress Vitals:   05/27/18 1040  BP: 137/88  Pulse: 76  Resp: 16  Temp: 98.3 F (36.8 C)  TempSrc: Oral  SpO2: 99%  Weight: 246 lb (111.6 kg)  Height: '5\' 3"'  (1.6 m)   BMI Assessment: Estimated body mass index is 43.58 kg/m as calculated from the following:   Height as of this encounter: '5\' 3"'  (1.6 m).   Weight as of this encounter: 246 lb (111.6 kg).  BMI interpretation table: BMI level Category Range association with higher incidence of chronic pain  <18 kg/m2 Underweight   18.5-24.9 kg/m2 Ideal body weight   25-29.9 kg/m2 Overweight Increased incidence by 20%  30-34.9 kg/m2 Obese (Class I) Increased incidence by 68%  35-39.9 kg/m2 Severe obesity (Class II) Increased incidence by 136%  >40 kg/m2 Extreme obesity (Class III) Increased incidence by 254%   Patient's current BMI Ideal Body weight  Body mass index is 43.58 kg/m. Ideal body weight: 52.4 kg (115 lb 8.3 oz) Adjusted ideal body weight: 76.1 kg (167 lb 11.4 oz)   BMI Readings from Last 4 Encounters:  05/27/18 43.58 kg/m  05/25/18 43.58 kg/m  05/07/18 43.58 kg/m  04/22/18 43.58 kg/m   Wt Readings from Last 4 Encounters:  05/27/18 246 lb (111.6 kg)  05/25/18 246 lb (111.6 kg)  05/07/18 246 lb (111.6 kg)  04/22/18 246 lb (111.6 kg)  Psych/Mental status: Alert, oriented x 3 (person, place, & time)       Eyes: PERLA Respiratory: No evidence of acute respiratory distress  Cervical Spine Area Exam  Skin & Axial Inspection: No masses, redness, edema, swelling, or  associated skin lesions Alignment: Symmetrical Functional ROM: Unrestricted ROM      Stability: No instability detected Muscle Tone/Strength: Functionally intact. No obvious neuro-muscular anomalies detected. Sensory (Neurological): Unimpaired Palpation: No palpable anomalies  Upper Extremity (UE) Exam    Side: Right upper extremity  Side: Left upper extremity  Skin & Extremity Inspection: Skin color, temperature, and hair growth are WNL. No peripheral edema or cyanosis. No masses, redness, swelling, asymmetry, or associated skin lesions. No contractures.  Skin & Extremity Inspection: Skin color, temperature, and hair growth are WNL. No peripheral edema or cyanosis. No masses, redness, swelling, asymmetry, or associated skin lesions. No contractures.  Functional ROM: Unrestricted ROM          Functional ROM: Unrestricted ROM          Muscle Tone/Strength: Functionally intact. No obvious neuro-muscular anomalies detected.  Muscle Tone/Strength: Functionally intact. No obvious neuro-muscular anomalies detected.  Sensory (Neurological): Unimpaired          Sensory (Neurological): Unimpaired          Palpation: No palpable anomalies              Palpation: No palpable anomalies              Provocative Test(s):  Phalen's test: deferred Tinel's test: deferred Apley's scratch test (touch opposite shoulder):  Action 1 (Across chest): deferred Action 2 (Overhead): deferred Action 3 (LB reach): deferred   Provocative Test(s):  Phalen's test: deferred Tinel's test: deferred Apley's scratch test (touch opposite shoulder):  Action 1 (Across chest): deferred Action 2 (Overhead): deferred Action 3 (LB reach): deferred    Thoracic Spine Area Exam  Skin & Axial Inspection: No masses, redness, or swelling Alignment: Symmetrical Functional ROM: Unrestricted ROM Stability: No instability detected Muscle Tone/Strength: Functionally intact. No obvious neuro-muscular anomalies  detected. Sensory (Neurological): Unimpaired Muscle strength & Tone: No palpable anomalies  Lumbar Spine Area Exam  Skin & Axial Inspection: No masses, redness, or swelling Alignment: Symmetrical Functional ROM: Improved after treatment       Stability: No instability detected Muscle Tone/Strength: Functionally intact. No obvious neuro-muscular anomalies detected. Sensory (Neurological): Unimpaired Palpation: No palpable anomalies       Provocative Tests: Hyperextension/rotation test: (+) bilaterally for facet joint pain. Lumbar quadrant test (Kemp's test): (+) bilaterally for facet joint pain. Lateral bending test: deferred today       Patrick's Maneuver: deferred today                   FABER test: deferred today                   S-I anterior distraction/compression test: deferred today         S-I lateral compression test: deferred today         S-I Thigh-thrust test: deferred today         S-I Gaenslen's test: deferred today          Gait & Posture Assessment  Ambulation: Unassisted Gait: Relatively normal for age and body habitus Posture: WNL   Lower Extremity Exam    Side: Right lower extremity  Side: Left lower extremity  Stability: No instability observed          Stability: No instability observed          Skin & Extremity Inspection: Skin color, temperature, and hair growth are WNL. No peripheral edema or cyanosis. No masses, redness, swelling, asymmetry, or associated skin lesions. No contractures.  Skin & Extremity Inspection: Skin color, temperature, and hair growth are WNL. No peripheral edema or cyanosis. No masses, redness, swelling, asymmetry, or associated skin lesions. No contractures.  Functional ROM: Unrestricted ROM  Functional ROM: Unrestricted ROM                  Muscle Tone/Strength: Functionally intact. No obvious neuro-muscular anomalies detected.  Muscle Tone/Strength: Functionally intact. No obvious neuro-muscular anomalies detected.   Sensory (Neurological): Unimpaired  Sensory (Neurological): Unimpaired  Palpation: No palpable anomalies  Palpation: No palpable anomalies   Assessment  Primary Diagnosis & Pertinent Problem List: The primary encounter diagnosis was Chronic low back pain (Primary Area of Pain) (Bilateral) (L>R) w/o sciatica. Diagnoses of Chronic lower extremity pain ( Secondary  Area of  Pain) (Bilateral) (L>R), Chronic neck pain (Tertiary Area of Pain) (Bilateral) (L>R), Chronic upper extremity pain (Fourth Area of Pain) (Bilateral) (L>R), Lumbar facet syndrome (Bilateral), Lumbar facet arthropathy (Bilateral), Spondylosis without myelopathy or radiculopathy, lumbosacral region, Neurogenic pain, Osteoarthritis involving multiple joints, and Chronic musculoskeletal pain were also pertinent to this visit.  Status Diagnosis  Improving Improved Controlled 1. Chronic low back pain (Primary Area of Pain) (Bilateral) (L>R) w/o sciatica   2. Chronic lower extremity pain ( Secondary  Area of  Pain) (Bilateral) (L>R)   3. Chronic neck pain (Tertiary Area of Pain) (Bilateral) (L>R)   4. Chronic upper extremity pain (Fourth Area of Pain) (Bilateral) (L>R)   5. Lumbar facet syndrome (Bilateral)   6. Lumbar facet arthropathy (Bilateral)   7. Spondylosis without myelopathy or radiculopathy, lumbosacral region   8. Neurogenic pain   9. Osteoarthritis involving multiple joints   10. Chronic musculoskeletal pain     Problems updated and reviewed during this visit: No problems updated. Plan of Care  Pharmacotherapy (Medications Ordered): Meds ordered this encounter  Medications  . gabapentin (NEURONTIN) 100 MG capsule    Sig: Take 1-3 capsules (100-300 mg total) by mouth 4 (four) times daily. Follow written titration schedule.    Dispense:  360 capsule    Refill:  0    Do not place medication on "Automatic Refill". Fill one day early if pharmacy is closed on scheduled refill date.  . meloxicam (MOBIC) 15 MG tablet     Sig: Take 1 tablet (15 mg total) by mouth daily.    Dispense:  30 tablet    Refill:  0    Do not add this medication to the electronic "Automatic Refill" notification system. Patient may have prescription filled one day early if pharmacy is closed on scheduled refill date.  . methocarbamol (ROBAXIN) 750 MG tablet    Sig: Take 1 tablet (750 mg total) by mouth every 8 (eight) hours as needed for muscle spasms.    Dispense:  90 tablet    Refill:  0    Do not place this medication, or any other prescription from our practice, on "Automatic Refill". Patient may have prescription filled one day early if pharmacy is closed on scheduled refill date.   Medications administered today: Jodelle Red. T[place]nham had no medications administered during this visit.   Procedure Orders     LUMBAR FACET(MEDIAL BRANCH NERVE BLOCK) MBNB     LUMBAR FACET(MEDIAL BRANCH NERVE BLOCK) MBNB Lab Orders  No laboratory test(s) ordered today   Imaging Orders  No imaging studies ordered today   Referral Orders  No referral(s) requested today   Interventional management options: Planned, scheduled, and/or pending:   Diagnostic bilateral lumbar facet block #2 under fluoroscopic guidance and IV sedation   Considering:   Diagnostic bilateral lumbar facet block  Possible bilateral lumbar facet RFA  Diagnostic left-sided sacroiliac joint block  Possible left-sided sacroiliac joint RFA  Diagnostic bilateral intra-articular hip joint injection  Diagnostic bilateral femoral nerve + obturator nerve block  Possible bilateral femoral nerve + obturator nerve RFA  Diagnostic left-sided LESI  Diagnostic bilateral transforaminal ESI  Diagnostic left-sided CESI  Diagnostic bilateral cervical facet block  Possible bilateral cervical facet RFA  Diagnostic bilateral intra-articular knee joint injections with local anesthetic and steroid  Possible series of 5 bilateral intra-articular Hyalgan knee injections  Diagnostic bilateral  genicular nerve blocks  Possible bilateral genicular nerve RFA  Diagnostic trigger point injections   Palliative PRN treatment(s):   Palliative bilateral lumbar facet block under fluoroscopic guidance and IV sedation   Provider-requested follow-up: Return for Procedure (w/ sedation): (B) L-FCT #2.  Future Appointments  Date Time Provider [place]  05/29/2018 11:00 AM Elby Beck, FNP [facility]  07/30/2018 11:00 AM Minna Merritts, MD [facility]   Primary Care Physician: Elby Beck, FNP Location: [facility] Note by: Gaspar Cola, MD Date: 05/27/2018; Time: 11:19 AM

## 2018-05-24 ENCOUNTER — Ambulatory Visit: Payer: Self-pay

## 2018-05-24 NOTE — Telephone Encounter (Signed)
Patient called in with c/o "muscular pain." She says "the pain is under both of my breasts all the way across and my abdomen. It's been hurting since 1 week ago. The pain feels like muscle spasms. It hurts now at a 5, but it can get up to a 10." I asked about other symptoms, she denies. According to protocol, see PCP within 3 days, appointment scheduled for Wednesday, 05/29/18 at 1100 with Clarene Reamer, FNP, care advice given, patient verbalized understanding.   Reason for Disposition . [1] MODERATE pain (e.g., interferes with normal activities) AND [2] present > 3 days  Answer Assessment - Initial Assessment Questions 1. ONSET: "When did the muscle aches or body pains start?"      1 week ago 2. LOCATION: "What part of your body is hurting?" (e.g., entire body, arms, legs)      Under left and right breast and stomach 3. SEVERITY: "How bad is the pain?" (Scale 1-10; or mild, moderate, severe)   - MILD (1-3): doesn't interfere with normal activities    - MODERATE (4-7): interferes with normal activities or awakens from sleep    - SEVERE (8-10):  excruciating pain, unable to do any normal activities      5 right now; can get as high as a 10 4. CAUSE: "What do you think is causing the pains?"     I don't know 5. FEVER: "Have you been having fever?"     No 6. OTHER SYMPTOMS: "Do you have any other symptoms?" (e.g., chest pain, weakness, rash, cold or flu symptoms, weight loss)     No 7. PREGNANCY: "Is there any chance you are pregnant?" "When was your last menstrual period?"     No 8. TRAVEL: "Have you traveled out of the country in the last month?" (e.g., travel history, exposures)     No  Protocols used: MUSCLE ACHES AND BODY PAIN-A-AH

## 2018-05-25 ENCOUNTER — Emergency Department: Payer: Medicare HMO

## 2018-05-25 ENCOUNTER — Other Ambulatory Visit: Payer: Self-pay

## 2018-05-25 ENCOUNTER — Encounter: Payer: Self-pay | Admitting: Emergency Medicine

## 2018-05-25 ENCOUNTER — Emergency Department
Admission: EM | Admit: 2018-05-25 | Discharge: 2018-05-25 | Disposition: A | Discharge status: Home / Self Care | Payer: Medicare HMO | Attending: Emergency Medicine | Admitting: Emergency Medicine

## 2018-05-25 DIAGNOSIS — E039 Hypothyroidism, unspecified: Secondary | ICD-10-CM | POA: Insufficient documentation

## 2018-05-25 DIAGNOSIS — R1010 Upper abdominal pain, unspecified: Secondary | ICD-10-CM | POA: Diagnosis present

## 2018-05-25 DIAGNOSIS — R079 Chest pain, unspecified: Secondary | ICD-10-CM

## 2018-05-25 DIAGNOSIS — Z79899 Other long term (current) drug therapy: Secondary | ICD-10-CM | POA: Diagnosis not present

## 2018-05-25 DIAGNOSIS — Z87891 Personal history of nicotine dependence: Secondary | ICD-10-CM | POA: Insufficient documentation

## 2018-05-25 DIAGNOSIS — I10 Essential (primary) hypertension: Secondary | ICD-10-CM | POA: Insufficient documentation

## 2018-05-25 LAB — COMPREHENSIVE METABOLIC PANEL
ALK PHOS: 71 U/L (ref 38–126)
ALT: 64 U/L — ABNORMAL HIGH (ref 0–44)
ANION GAP: 9 (ref 5–15)
AST: 61 U/L — ABNORMAL HIGH (ref 15–41)
Albumin: 4.3 g/dL (ref 3.5–5.0)
BUN: 12 mg/dL (ref 6–20)
CO2: 31 mmol/L (ref 22–32)
CREATININE: 0.99 mg/dL (ref 0.44–1.00)
Calcium: 9.7 mg/dL (ref 8.9–10.3)
Chloride: 100 mmol/L (ref 98–111)
Glucose, Bld: 121 mg/dL — ABNORMAL HIGH (ref 70–99)
Potassium: 3.7 mmol/L (ref 3.5–5.1)
SODIUM: 140 mmol/L (ref 135–145)
TOTAL PROTEIN: 8.2 g/dL — AB (ref 6.5–8.1)
Total Bilirubin: 0.7 mg/dL (ref 0.3–1.2)

## 2018-05-25 LAB — URINALYSIS, COMPLETE (UACMP) WITH MICROSCOPIC
BACTERIA UA: NONE SEEN
Bilirubin Urine: NEGATIVE
GLUCOSE, UA: NEGATIVE mg/dL
Ketones, ur: NEGATIVE mg/dL
NITRITE: NEGATIVE
PH: 7 (ref 5.0–8.0)
PROTEIN: NEGATIVE mg/dL
Specific Gravity, Urine: 1.017 (ref 1.005–1.030)

## 2018-05-25 LAB — TROPONIN I
Troponin I: 0.03 ng/mL (ref <0.03)
Troponin I: 0.03 ng/mL (ref <0.03)

## 2018-05-25 LAB — CBC
HEMATOCRIT: 44 % (ref 36.0–46.0)
HEMOGLOBIN: 14.5 g/dL (ref 12.0–15.0)
MCH: 30.1 pg (ref 26.0–34.0)
MCHC: 33 g/dL (ref 30.0–36.0)
MCV: 91.3 fL (ref 80.0–100.0)
NRBC: 0 % (ref 0.0–0.2)
Platelets: 238 10*3/uL (ref 150–400)
RBC: 4.82 MIL/uL (ref 3.87–5.11)
RDW: 13.3 % (ref 11.5–15.5)
WBC: 7.8 10*3/uL (ref 4.0–10.5)

## 2018-05-25 LAB — LIPASE, BLOOD: Lipase: 25 U/L (ref 11–51)

## 2018-05-25 MED ORDER — MORPHINE SULFATE (PF) 4 MG/ML IV SOLN
4.0000 mg | Freq: Once | INTRAVENOUS | Status: AC
  Administered 2018-05-25: 4 mg via INTRAVENOUS
  Filled 2018-05-25: qty 1

## 2018-05-25 MED ORDER — DICLOFENAC SODIUM 1 % TD GEL: TRANSDERMAL | 0 refills | Status: DC

## 2018-05-25 MED ORDER — ONDANSETRON HCL 4 MG/2ML IJ SOLN
4.0000 mg | Freq: Once | INTRAMUSCULAR | Status: AC
  Administered 2018-05-25: 4 mg via INTRAVENOUS
  Filled 2018-05-25: qty 2

## 2018-05-25 MED ORDER — SODIUM CHLORIDE 0.9 % IV BOLUS
1000.0000 mL | Freq: Once | INTRAVENOUS | Status: AC
  Administered 2018-05-25: 1000 mL via INTRAVENOUS

## 2018-05-25 MED ORDER — IOPAMIDOL (ISOVUE-370) INJECTION 76%
100.0000 mL | Freq: Once | INTRAVENOUS | Status: AC | PRN
  Administered 2018-05-25: 100 mL via INTRAVENOUS

## 2018-05-25 NOTE — ED Notes (Signed)
Checked on pt - still has approx 460 cc left to infuse.

## 2018-05-25 NOTE — ED Triage Notes (Signed)
Pt to ed with c/o bilat upper abd pain that gets worse when she bends over.  Pt reports pain has been progressive over the past week. Denies n/v/d.

## 2018-05-25 NOTE — ED Provider Notes (Signed)
[facility] Emergency Department Provider Note  ___________________________________________   First MD Initiated Contact with Patient 05/25/18 1257     (approximate)  I have reviewed the triage vital signs and the nursing notes.   HISTORY  Chief Complaint Abdominal Pain   HPI Teresa Mcgee is a 52 y.o. female with a history of hiatal hernia, Bell's palsy and chest pain who was presented to the emergency department today complaining of upper abdominal pain over the past week.  She says that it started after she was cleaning some cabinets with her arms overhead.  She says the pain is worsening with movement and is sharp and severe with movement as well.  She denies any shortness of breath, nausea vomiting or diarrhea.  Says that she still has her gallbladder.  Says that there is a component of radiation to the back, bilaterally.  Does not worsen with eating.  Past Medical History:  Diagnosis Date  . Arthritis   . Bell's palsy may 2012  . Chest pain   . GERD (gastroesophageal reflux disease)   . H/O hiatal hernia   . Hyperlipidemia   . Hypertension   . Hypothyroidism   . Shingles   . Shortness of breath   . Sleep apnea    uses cpap    Patient Active Problem List   Diagnosis Date Noted  . Cervicalgia 04/22/2018  . Chronic musculoskeletal pain 04/22/2018  . DDD (degenerative disc disease), thoracic 04/22/2018  . DDD (degenerative disc disease), cervical 04/22/2018  . Lumbar facet arthropathy (Bilateral) 04/22/2018  . Lumbar facet syndrome (Bilateral) 04/22/2018  . Chronic low back pain (Primary Area of Pain) (Bilateral) (L>R) w/o sciatica 04/22/2018  . Spondylosis without myelopathy or radiculopathy, cervical region 04/22/2018  . Spondylosis without myelopathy or radiculopathy, lumbosacral region 04/22/2018  . DDD (degenerative disc disease), lumbosacral 04/22/2018  . Strain of lumbar paraspinal muscle, sequela 04/22/2018  . Chronic hip pain  (Bilateral) (R>L) 04/22/2018  . Chronic sacroiliac joint pain (Left) 04/22/2018  . Neurogenic pain 04/22/2018  . Osteoarthritis involving multiple joints 04/22/2018  . Chronic knee pain (Bilateral) (R>L) 04/22/2018  . Chronic upper extremity pain (Fourth Area of Pain) (Bilateral) (L>R) 02/27/2018  . Vitamin D deficiency 02/27/2018  . Chronic low back pain (Bilateral) (L>R) w/ sciatica (Bilateral) 01/24/2018  . Chronic lower extremity pain ( Secondary  Area of  Pain) (Bilateral) (L>R) 01/24/2018  . Chronic neck pain [facility] Area of Pain) (Bilateral) (L>R) 01/24/2018  . Chronic pain syndrome 01/24/2018  . Long term current use of opiate analgesic 01/24/2018  . Pharmacologic therapy 01/24/2018  . Disorder of skeletal system 01/24/2018  . Problems influencing health status 01/24/2018  . Osteopathic Exam Findings (Somatic Dysfunction) 04/25/2012  . Impaired glucose tolerance 02/14/2012  . Constipation 01/11/2012  . Chest pain 01/11/2012  . Obesity (BMI 35.0-39.9 without comorbidity) 12/28/2011  . 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 Numbness and tingling in both hands 08/25/2011  . 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  . Muscle spasms of neck 05/23/2011  . Nasal mucositis (ulcerative) 05/23/2011  . Fatigue 03/21/2011  . TOBACCO ABUSE 05/24/2010  . Hypothyroidism 01/25/2010  . HYPERTENSION, BENIGN ESSENTIAL 01/25/2010  . HYPERCHOLESTEROLEMIA 12/31/2009  . DEPRESSION 12/31/2009  . GERD 12/31/2009  . Sleep apnea 12/31/2009    Past Surgical History:  Procedure Laterality Date  . CESAREAN SECTION    . cesarian     3x  . PARTIAL HYSTERECTOMY  2000   abnormal uterine bleeding  . THYROIDECTOMY  1998  .  TOOTH EXTRACTION    . TUBAL LIGATION      Prior to Admission medications   Medication Sig Start Date End Date Taking? Authorizing Provider  amLODipine (NORVASC) 10 MG tablet Take 1 tablet (10 mg total) by mouth daily. 11/16/17   Elby Beck, FNP  Calcium Carbonate-Vit D-Min (GNP  CALCIUM 1200) 1200-1000 MG-UNIT CHEW Chew 1,200 mg by mouth daily with breakfast. Take in combination with vitamin D and magnesium. 02/27/18 08/26/18  Milinda Pointer, MD  carboxymethylcellulose 1 % ophthalmic solution Apply 1 drop to eye 3 (three) times daily. 11/16/17   Elby Beck, FNP  cetirizine (ZYRTEC) 10 MG tablet Take 1 tablet (10 mg total) by mouth daily. 11/16/17   Elby Beck, FNP  fluticasone (FLONASE) 50 MCG/ACT nasal spray Place 2 sprays into both nostrils daily. 01/09/18   Elby Beck, FNP  gabapentin (NEURONTIN) 100 MG capsule Take 1-3 capsules (100-300 mg total) by mouth 4 (four) times daily. Follow written titration schedule. 04/22/18 05/22/18  Milinda Pointer, MD  hydrochlorothiazide (HYDRODIURIL) 25 MG tablet Take 1 tablet (25 mg total) by mouth daily. as directed 11/16/17   Elby Beck, FNP  ketotifen (ZADITOR) 0.025 % ophthalmic solution Place 1 drop into both eyes 2 (two) times daily. 11/16/17   Elby Beck, FNP  levothyroxine (SYNTHROID, LEVOTHROID) 100 MCG tablet TAKE 1 TABLET BY MOUTH EVERY DAY 01/23/18   Elby Beck, FNP  Magnesium 500 MG CAPS Take 1 capsule (500 mg total) by mouth 2 (two) times daily at 8 am and 10 pm. 02/27/18 08/26/18  Milinda Pointer, MD  meloxicam (MOBIC) 15 MG tablet Take 1 tablet (15 mg total) by mouth daily. 04/22/18 05/22/18  Milinda Pointer, MD  REFRESH CELLUVISC 1 % GEL APPLY 1 DROP TO EYE 3 (THREE) TIMES DAILY. 11/16/17   [provider]    Allergies Patient has no known allergies.  Family History  Problem Relation Age of Onset  . Heart attack Father 44       deceased  . Diabetes Brother   . Stroke Brother 25       twice  . Stroke Sister 14  . Diabetes Sister   . Heart attack Sister 5  . Heart attack Brother 36    Social History Social History   Tobacco Use  . Smoking status: Former Smoker    Years: 20.00  . Smokeless tobacco: Never Used  . Tobacco comment: used to smoke 1.5 - 2 PPD    Substance Use Topics  . Alcohol use: No  . Drug use: No    Review of Systems  Constitutional: No fever/chills Eyes: No visual changes. ENT: No sore throat. Cardiovascular: Denies chest pain. Respiratory: Denies shortness of breath. Gastrointestinal:   No nausea, no vomiting.  No diarrhea.  No constipation. Genitourinary: Negative for dysuria. Musculoskeletal: Negative for back pain. Skin: Negative for rash. Neurological: Negative for headaches, focal weakness or numbness.   ____________________________________________   PHYSICAL EXAM:  VITAL SIGNS: ED Triage Vitals  Enc Vitals Group     BP 05/25/18 1140 120/76     Pulse Rate 05/25/18 1140 97     Resp 05/25/18 1140 18     Temp 05/25/18 1140 98.5 F (36.9 C)     Temp Source 05/25/18 1140 Oral     SpO2 05/25/18 1140 98 %     Weight 05/25/18 1141 246 lb (111.6 kg)     Height 05/25/18 1141 5\' 3"  (1.6 m)     Head  Circumference --      Peak Flow --      Pain Score 05/25/18 1140 5     Pain Loc --      Pain Edu? --      Excl. in [place]? --     Constitutional: Alert and oriented. Well appearing and in no acute distress. Eyes: Conjunctivae are normal.  Head: Atraumatic. Nose: No congestion/rhinnorhea. Mouth/Throat: Mucous membranes are moist.  Neck: No stridor.   Cardiovascular: Normal rate, regular rhythm. Grossly normal heart sounds.  Respiratory: Normal respiratory effort.  No retractions. Lungs CTAB. Gastrointestinal: Soft with mild to moderate bilateral upper abdominal tenderness to palpation without any rebound or guarding.  Negative Murphy sign.. No distention. No CVA tenderness.  Tenderness extends to the bilateral lower thorax, anteriorly as well as laterally without any crepitus, deformity, erythema. Musculoskeletal: No lower extremity tenderness nor edema.  No joint effusions. Neurologic:  Normal speech and language. No gross focal neurologic deficits are appreciated. Skin:  Skin is warm, dry and intact. No rash  noted. Psychiatric: Mood and affect are normal. Speech and behavior are normal.  ____________________________________________   LABS (all labs ordered are listed, but only abnormal results are displayed)  Labs Reviewed  COMPREHENSIVE METABOLIC PANEL - Abnormal; Notable for the following components:      Result Value   Glucose, Bld 121 (*)    Total Protein 8.2 (*)    AST 61 (*)    ALT 64 (*)    All other components within normal limits  URINALYSIS, COMPLETE (UACMP) WITH MICROSCOPIC - Abnormal; Notable for the following components:   Color, Urine YELLOW (*)    APPearance HAZY (*)    Hgb urine dipstick SMALL (*)    Leukocytes, UA LARGE (*)    All other components within normal limits  TROPONIN I - Abnormal; Notable for the following components:   Troponin I 0.03 (*)    All other components within normal limits  TROPONIN I - Abnormal; Notable for the following components:   Troponin I 0.03 (*)    All other components within normal limits  LIPASE, BLOOD  CBC   ____________________________________________  EKG  ED ECG REPORT I, Doran Stabler, the attending physician, personally viewed and interpreted this ECG.   Date: 05/25/2018  EKG Time: 1320  Rate: 56  Rhythm: normal sinus rhythm  Axis: Normal  Intervals:none  ST&T Change: No ST segment elevation or depression.  No abnormal T wave inversion.  ____________________________________________  RADIOLOGY  No acute finding on the chest x-ray.  No evidence for acute pulmonary embolus.  No acute process within the abdomen or pelvis. ____________________________________________   PROCEDURES  Procedure(s) performed:   Procedures  Critical Care performed:   ____________________________________________   INITIAL IMPRESSION / ASSESSMENT AND PLAN / ED COURSE  Pertinent labs & imaging results that were available during my care of the patient were reviewed by me and considered in my medical decision making (see chart  for details).  Differential diagnosis includes, but is not limited to, biliary disease (biliary colic, acute cholecystitis, cholangitis, choledocholithiasis, etc), intrathoracic causes for epigastric abdominal pain including ACS, gastritis, duodenitis, pancreatitis, small bowel or large bowel obstruction, abdominal aortic aneurysm, hernia, and ulcer(s). As part of my medical decision making, I reviewed the following data within the electronic MEDICAL RECORD NUMBER Notes from prior ED visits  ----------------------------------------- 5:14 PM on 05/25/2018 -----------------------------------------  Patient at this time with pain that is improved but still with pain with motion.  Stable troponins  of 0.03.  Reassuring EKG.  No shortness of breath, nausea or vomiting.  Reassuring CAT scans.  Patient with story and exam are most consistent with musculoskeletal pain.  Patient says that she is follow-up with her primary care doctor this coming Wednesday and with her pain clinician this coming Monday.  She will be discharged with diclofenac gel.  She is understand the diagnosis as well as treatment plan willing to comply.  I will also give her follow-up with cardiology. ____________________________________________   FINAL CLINICAL IMPRESSION(S) / ED DIAGNOSES  Chest pain.  Upper abdominal pain.  NEW MEDICATIONS STARTED DURING THIS VISIT:  New Prescriptions   No medications on file     Note:  This document was prepared using Dragon voice recognition software and may include unintentional dictation errors.     Orbie Pyo, MD 05/25/18 [phone]

## 2018-05-25 NOTE — ED Notes (Signed)
Patient transported to X-ray 

## 2018-05-27 ENCOUNTER — Other Ambulatory Visit: Payer: Self-pay

## 2018-05-27 ENCOUNTER — Ambulatory Visit: Payer: Medicare HMO | Attending: Pain Medicine | Admitting: Pain Medicine

## 2018-05-27 ENCOUNTER — Encounter: Payer: Self-pay | Admitting: Pain Medicine

## 2018-05-27 VITALS — BP 137/88 | HR 76 | Temp 98.3°F | Resp 16 | Ht 63.0 in | Wt 246.0 lb

## 2018-05-27 DIAGNOSIS — M79602 Pain in left arm: Secondary | ICD-10-CM | POA: Diagnosis not present

## 2018-05-27 DIAGNOSIS — Z9889 Other specified postprocedural states: Secondary | ICD-10-CM | POA: Insufficient documentation

## 2018-05-27 DIAGNOSIS — M7918 Myalgia, other site: Secondary | ICD-10-CM | POA: Diagnosis not present

## 2018-05-27 DIAGNOSIS — M542 Cervicalgia: Secondary | ICD-10-CM | POA: Diagnosis not present

## 2018-05-27 DIAGNOSIS — M79601 Pain in right arm: Secondary | ICD-10-CM | POA: Diagnosis not present

## 2018-05-27 DIAGNOSIS — Z79890 Hormone replacement therapy (postmenopausal): Secondary | ICD-10-CM | POA: Insufficient documentation

## 2018-05-27 DIAGNOSIS — F17200 Nicotine dependence, unspecified, uncomplicated: Secondary | ICD-10-CM | POA: Insufficient documentation

## 2018-05-27 DIAGNOSIS — K219 Gastro-esophageal reflux disease without esophagitis: Secondary | ICD-10-CM | POA: Insufficient documentation

## 2018-05-27 DIAGNOSIS — I10 Essential (primary) hypertension: Secondary | ICD-10-CM | POA: Insufficient documentation

## 2018-05-27 DIAGNOSIS — K449 Diaphragmatic hernia without obstruction or gangrene: Secondary | ICD-10-CM | POA: Insufficient documentation

## 2018-05-27 DIAGNOSIS — G8929 Other chronic pain: Secondary | ICD-10-CM

## 2018-05-27 DIAGNOSIS — M5134 Other intervertebral disc degeneration, thoracic region: Secondary | ICD-10-CM | POA: Diagnosis not present

## 2018-05-27 DIAGNOSIS — M79604 Pain in right leg: Secondary | ICD-10-CM

## 2018-05-27 DIAGNOSIS — M545 Low back pain: Secondary | ICD-10-CM | POA: Diagnosis present

## 2018-05-27 DIAGNOSIS — M792 Neuralgia and neuritis, unspecified: Secondary | ICD-10-CM

## 2018-05-27 DIAGNOSIS — Z8249 Family history of ischemic heart disease and other diseases of the circulatory system: Secondary | ICD-10-CM | POA: Insufficient documentation

## 2018-05-27 DIAGNOSIS — Z79899 Other long term (current) drug therapy: Secondary | ICD-10-CM | POA: Insufficient documentation

## 2018-05-27 DIAGNOSIS — M47816 Spondylosis without myelopathy or radiculopathy, lumbar region: Secondary | ICD-10-CM

## 2018-05-27 DIAGNOSIS — M1990 Unspecified osteoarthritis, unspecified site: Secondary | ICD-10-CM | POA: Diagnosis not present

## 2018-05-27 DIAGNOSIS — M159 Polyosteoarthritis, unspecified: Secondary | ICD-10-CM

## 2018-05-27 DIAGNOSIS — E039 Hypothyroidism, unspecified: Secondary | ICD-10-CM | POA: Diagnosis not present

## 2018-05-27 DIAGNOSIS — M79605 Pain in left leg: Secondary | ICD-10-CM

## 2018-05-27 DIAGNOSIS — M150 Primary generalized (osteo)arthritis: Secondary | ICD-10-CM

## 2018-05-27 DIAGNOSIS — M47817 Spondylosis without myelopathy or radiculopathy, lumbosacral region: Secondary | ICD-10-CM

## 2018-05-27 MED ORDER — GABAPENTIN 100 MG PO CAPS: 100.0000 mg | ORAL_CAPSULE | Freq: Four times a day (QID) | ORAL | 0 refills | Status: DC

## 2018-05-27 MED ORDER — METHOCARBAMOL 750 MG PO TABS: 750.0000 mg | ORAL_TABLET | Freq: Three times a day (TID) | ORAL | 0 refills | Status: DC | PRN

## 2018-05-27 MED ORDER — MELOXICAM 15 MG PO TABS: 15.0000 mg | ORAL_TABLET | Freq: Every day | ORAL | 0 refills | Status: DC

## 2018-05-27 NOTE — Progress Notes (Signed)
Safety precautions to be maintained throughout the outpatient stay will include: orient to surroundings, keep bed in low position, maintain call bell within reach at all times, provide assistance with transfer out of bed and ambulation.  

## 2018-05-27 NOTE — Patient Instructions (Addendum)
____________________________________________________________________________________________  Pain Scale  Introduction: The pain score used by this practice is the Verbal Numerical Rating Scale (VNRS-11). This is an 11-point scale. It is for adults and children 10 years or older. There are significant differences in how the pain score is reported, used, and applied. Forget everything you learned in the past and learn this scoring system.  General Information: The scale should reflect your current level of pain. Unless you are specifically asked for the level of your worst pain, or your average pain. If you are asked for one of these two, then it should be understood that it is over the past 24 hours.  Basic Activities of Daily Living (ADL): Personal hygiene, dressing, eating, transferring, and using restroom.  Instructions: Most patients tend to report their level of pain as a combination of two factors, their physical pain and their psychosocial pain. This last one is also known as "suffering" and it is reflection of how physical pain affects you socially and psychologically. From now on, report them separately. From this point on, when asked to report your pain level, report only your physical pain. Use the following table for reference.  Pain Clinic Pain Levels (0-5/10)  Pain Level Score  Description  No Pain 0   Mild pain 1 Nagging, annoying, but does not interfere with basic activities of daily living (ADL). Patients are able to eat, bathe, get dressed, toileting (being able to get on and off the toilet and perform personal hygiene functions), transfer (move in and out of bed or a chair without assistance), and maintain continence (able to control bladder and bowel functions). Blood pressure and heart rate are unaffected. A normal heart rate for a healthy adult ranges from 60 to 100 bpm (beats per minute).   Mild to moderate pain 2 Noticeable and distracting. Impossible to hide from other  people. More frequent flare-ups. Still possible to adapt and function close to normal. It can be very annoying and may have occasional stronger flare-ups. With discipline, patients may get used to it and adapt.   Moderate pain 3 Interferes significantly with activities of daily living (ADL). It becomes difficult to feed, bathe, get dressed, get on and off the toilet or to perform personal hygiene functions. Difficult to get in and out of bed or a chair without assistance. Very distracting. With effort, it can be ignored when deeply involved in activities.   Moderately severe pain 4 Impossible to ignore for more than a few minutes. With effort, patients may still be able to manage work or participate in some social activities. Very difficult to concentrate. Signs of autonomic nervous system discharge are evident: dilated pupils (mydriasis); mild sweating (diaphoresis); sleep interference. Heart rate becomes elevated (>115 bpm). Diastolic blood pressure (lower number) rises above 100 mmHg. Patients find relief in laying down and not moving.   Severe pain 5 Intense and extremely unpleasant. Associated with frowning face and frequent crying. Pain overwhelms the senses.  Ability to do any activity or maintain social relationships becomes significantly limited. Conversation becomes difficult. Pacing back and forth is common, as getting into a comfortable position is nearly impossible. Pain wakes you up from deep sleep. Physical signs will be obvious: pupillary dilation; increased sweating; goosebumps; brisk reflexes; cold, clammy hands and feet; nausea, vomiting or dry heaves; loss of appetite; significant sleep disturbance with inability to fall asleep or to remain asleep. When persistent, significant weight loss is observed due to the complete loss of appetite and sleep deprivation.  Blood   pressure and heart rate becomes significantly elevated. Caution: If elevated blood pressure triggers a pounding headache  associated with blurred vision, then the patient should immediately seek attention at an urgent or emergency care unit, as these may be signs of an impending stroke.    Emergency Department Pain Levels (6-10/10)  Emergency Room Pain 6 Severely limiting. Requires emergency care and should not be seen or managed at an outpatient pain management facility. Communication becomes difficult and requires great effort. Assistance to reach the emergency department may be required. Facial flushing and profuse sweating along with potentially dangerous increases in heart rate and blood pressure will be evident.   Distressing pain 7 Self-care is very difficult. Assistance is required to transport, or use restroom. Assistance to reach the emergency department will be required. Tasks requiring coordination, such as bathing and getting dressed become very difficult.   Disabling pain 8 Self-care is no longer possible. At this level, pain is disabling. The individual is unable to do even the most "basic" activities such as walking, eating, bathing, dressing, transferring to a bed, or toileting. Fine motor skills are lost. It is difficult to think clearly.   Incapacitating pain 9 Pain becomes incapacitating. Thought processing is no longer possible. Difficult to remember your own name. Control of movement and coordination are lost.   The worst pain imaginable 10 At this level, most patients pass out from pain. When this level is reached, collapse of the autonomic nervous system occurs, leading to a sudden drop in blood pressure and heart rate. This in turn results in a temporary and dramatic drop in blood flow to the brain, leading to a loss of consciousness. Fainting is one of the body's self defense mechanisms. Passing out puts the brain in a calmed state and causes it to shut down for a while, in order to begin the healing process.    Summary: 1. Refer to this scale when providing us with your pain level. 2. Be  accurate and careful when reporting your pain level. This will help with your care. 3. Over-reporting your pain level will lead to loss of credibility. 4. Even a level of 1/10 means that there is pain and will be treated at our facility. 5. High, inaccurate reporting will be documented as "Symptom Exaggeration", leading to loss of credibility and suspicions of possible secondary gains such as obtaining more narcotics, or wanting to appear disabled, for fraudulent reasons. 6. Only pain levels of 5 or below will be seen at our facility. 7. Pain levels of 6 and above will be sent to the Emergency Department and the appointment cancelled. ____________________________________________________________________________________________   ____________________________________________________________________________________________  Preparing for Procedure with Sedation  Instructions: . Oral Intake: Do not eat or drink anything for at least 8 hours prior to your procedure. . Transportation: Public transportation is not allowed. Bring an adult driver. The driver must be physically present in our waiting room before any procedure can be started. . Physical Assistance: Bring an adult physically capable of assisting you, in the event you need help. This adult should keep you company at home for at least 6 hours after the procedure. . Blood Pressure Medicine: Take your blood pressure medicine with a sip of water the morning of the procedure. . Blood thinners: Notify our staff if you are taking any blood thinners. Depending on which one you take, there will be specific instructions on how and when to stop it. . Diabetics on insulin: Notify the staff so that you can be   scheduled 1st case in the morning. If your diabetes requires high dose insulin, take only  of your normal insulin dose the morning of the procedure and notify the staff that you have done so. . Preventing infections: Shower with an antibacterial soap  the morning of your procedure. . Build-up your immune system: Take 1000 mg of Vitamin C with every meal (3 times a day) the day prior to your procedure. . Antibiotics: Inform the staff if you have a condition or reason that requires you to take antibiotics before dental procedures. . Pregnancy: If you are pregnant, call and cancel the procedure. . Sickness: If you have a cold, fever, or any active infections, call and cancel the procedure. . Arrival: You must be in the facility at least 30 minutes prior to your scheduled procedure. . Children: Do not bring children with you. . Dress appropriately: Bring dark clothing that you would not mind if they get stained. . Valuables: Do not bring any jewelry or valuables.  Procedure appointments are reserved for interventional treatments only. . No Prescription Refills. . No medication changes will be discussed during procedure appointments. . No disability issues will be discussed.  Reasons to call and reschedule or cancel your procedure: (Following these recommendations will minimize the risk of a serious complication.) . Surgeries: Avoid having procedures within 2 weeks of any surgery. (Avoid for 2 weeks before or after any surgery). . Flu Shots: Avoid having procedures within 2 weeks of a flu shots or . (Avoid for 2 weeks before or after immunizations). . Barium: Avoid having a procedure within 7-10 days after having had a radiological study involving the use of radiological contrast. (Myelograms, Barium swallow or enema study). . Heart attacks: Avoid any elective procedures or surgeries for the initial 6 months after a "Myocardial Infarction" (Heart Attack). . Blood thinners: It is imperative that you stop these medications before procedures. Let us know if you if you take any blood thinner.  . Infection: Avoid procedures during or within two weeks of an infection (including chest colds or gastrointestinal problems). Symptoms associated with  infections include: Localized redness, fever, chills, night sweats or profuse sweating, burning sensation when voiding, cough, congestion, stuffiness, runny nose, sore throat, diarrhea, nausea, vomiting, cold or Flu symptoms, recent or current infections. It is specially important if the infection is over the area that we intend to treat. . Heart and lung problems: Symptoms that may suggest an active cardiopulmonary problem include: cough, chest pain, breathing difficulties or shortness of breath, dizziness, ankle swelling, uncontrolled high or unusually low blood pressure, and/or palpitations. If you are experiencing any of these symptoms, cancel your procedure and contact your primary care physician for an evaluation.  Remember:  Regular Business hours are:  Monday to Thursday 8:00 AM to 4:00 PM  Provider's Schedule: Alleah Dearman, MD:  Procedure days: Tuesday and Thursday 7:30 AM to 4:00 PM  Bilal Lateef, MD:  Procedure days: Monday and Wednesday 7:30 AM to 4:00 PM ____________________________________________________________________________________________    

## 2018-05-29 ENCOUNTER — Encounter: Payer: Self-pay | Admitting: Family Medicine

## 2018-05-29 ENCOUNTER — Ambulatory Visit (INDEPENDENT_AMBULATORY_CARE_PROVIDER_SITE_OTHER): Payer: Medicare HMO | Admitting: Family Medicine

## 2018-05-29 VITALS — BP 124/80 | HR 66 | Temp 97.6°F | Ht 63.0 in | Wt 262.0 lb

## 2018-05-29 DIAGNOSIS — E039 Hypothyroidism, unspecified: Secondary | ICD-10-CM | POA: Diagnosis not present

## 2018-05-29 DIAGNOSIS — R21 Rash and other nonspecific skin eruption: Secondary | ICD-10-CM

## 2018-05-29 DIAGNOSIS — R7989 Other specified abnormal findings of blood chemistry: Secondary | ICD-10-CM

## 2018-05-29 DIAGNOSIS — R7303 Prediabetes: Secondary | ICD-10-CM | POA: Diagnosis not present

## 2018-05-29 DIAGNOSIS — R945 Abnormal results of liver function studies: Secondary | ICD-10-CM

## 2018-05-29 LAB — HEPATIC FUNCTION PANEL
ALK PHOS: 73 U/L (ref 39–117)
ALT: 44 U/L — ABNORMAL HIGH (ref 0–35)
AST: 40 U/L — ABNORMAL HIGH (ref 0–37)
Albumin: 4.4 g/dL (ref 3.5–5.2)
BILIRUBIN DIRECT: 0.1 mg/dL (ref 0.0–0.3)
Total Bilirubin: 0.5 mg/dL (ref 0.2–1.2)
Total Protein: 8.1 g/dL (ref 6.0–8.3)

## 2018-05-29 LAB — HEMOGLOBIN A1C: Hgb A1c MFr Bld: 7.2 % — ABNORMAL HIGH (ref 4.6–6.5)

## 2018-05-29 LAB — TSH: TSH: 18.2 u[IU]/mL — ABNORMAL HIGH (ref 0.35–4.50)

## 2018-05-29 NOTE — Progress Notes (Signed)
Subjective:    Patient ID: Teresa Mcgee, female    DOB: Mar 19, 1966, 52 y.o.   MRN: [id number]  HPI This is a 52 yo female who presents today for follow up of chronic conditions- prediabetes, HTN.  Stopped smoking last month! Was recently seen in ER and by her pain management provider for upper abdominal and back pain. Spasms following spinal block and pain following doing chores with arms overhead. Pain has improved. She had a mildly elevated troponin in ED and has follow up scheduled with Dr. Rockey Situ.  OSA- will be getting new CPAP soon.   Denies chest pain, SOB, swelling. Rash on right dorsal hand improving.    Past Medical History:  Diagnosis Date  . Arthritis   . Bell's palsy may 2012  . Chest pain   . GERD (gastroesophageal reflux disease)   . H/O hiatal hernia   . Hyperlipidemia   . Hypertension   . Hypothyroidism   . Shingles   . Shortness of breath   . Sleep apnea    uses cpap   Past Surgical History:  Procedure Laterality Date  . CESAREAN SECTION    . cesarian     3x  . PARTIAL HYSTERECTOMY  2000   abnormal uterine bleeding  . THYROIDECTOMY  1998  . TOOTH EXTRACTION    . TUBAL LIGATION     Family History  Problem Relation Age of Onset  . Heart attack Father 19       deceased  . Diabetes Brother   . Stroke Brother 25       twice  . Stroke Sister 73  . Diabetes Sister   . Heart attack Sister 55  . Heart attack Brother 36   Social History   Tobacco Use  . Smoking status: Current Every Day Smoker    Years: 20.00  . Smokeless tobacco: Never Used  . Tobacco comment: used to smoke 1.5 - 2 PPD  Substance Use Topics  . Alcohol use: No  . Drug use: No      Review of Systems Per HPI    Objective:   Physical Exam  Constitutional: She is oriented to person, place, and time. She appears well-developed and well-nourished. No distress.  HENT:  Head: Normocephalic and atraumatic.  Eyes: Conjunctivae are normal.  Cardiovascular: Normal rate,  regular rhythm and normal heart sounds.  Pulmonary/Chest: Effort normal and breath sounds normal.  Neurological: She is alert and oriented to person, place, and time.  Skin: Skin is warm and dry. She is not diaphoretic.  Right hand, dorsal aspect with area thickening.   Psychiatric: She has a normal mood and affect. Her behavior is normal. Judgment and thought content normal.  Vitals reviewed.     BP 124/80 (BP Location: Right Arm, Patient Position: Sitting, Cuff Size: Large)   Pulse 66   Temp 97.6 F (36.4 C) (Oral)   Ht 5\' 3"  (1.6 m)   Wt 262 lb (118.8 kg)   SpO2 97%   BMI 46.41 kg/m    Wt Readings from Last 3 Encounters:  05/29/18 262 lb (118.8 kg)  05/27/18 246 lb (111.6 kg)  05/25/18 246 lb (111.6 kg)       Assessment & Plan:  1. Elevated LFTs - Hepatic Function Panel  2. Acquired hypothyroidism - TSH  3. Prediabetes - Hemoglobin A1c  4. Rash - Improved with SSD,  Provided her with additional cream  - follow up in 6 months   Clarene Reamer, FNP-BC  [facility] at [facility], [place], [facility]  05/29/2018 11:24 AM

## 2018-05-29 NOTE — Patient Instructions (Signed)
Good to see you today!  I am so proud of you for quitting smoking  Please follow up in 6 months

## 2018-06-03 ENCOUNTER — Other Ambulatory Visit: Payer: Self-pay | Admitting: Family Medicine

## 2018-06-03 MED ORDER — LEVOTHYROXINE SODIUM 112 MCG PO TABS: 112.0000 ug | ORAL_TABLET | Freq: Every day | ORAL | 1 refills | Status: DC

## 2018-06-21 ENCOUNTER — Encounter: Payer: Self-pay | Admitting: Family Medicine

## 2018-06-21 ENCOUNTER — Ambulatory Visit (INDEPENDENT_AMBULATORY_CARE_PROVIDER_SITE_OTHER): Payer: Medicare HMO | Admitting: Family Medicine

## 2018-06-21 VITALS — BP 128/80 | HR 74 | Temp 98.5°F | Ht 63.0 in | Wt 267.0 lb

## 2018-06-21 DIAGNOSIS — R05 Cough: Secondary | ICD-10-CM | POA: Diagnosis not present

## 2018-06-21 DIAGNOSIS — R062 Wheezing: Secondary | ICD-10-CM | POA: Diagnosis not present

## 2018-06-21 DIAGNOSIS — J22 Unspecified acute lower respiratory infection: Secondary | ICD-10-CM | POA: Diagnosis not present

## 2018-06-21 DIAGNOSIS — R059 Cough, unspecified: Secondary | ICD-10-CM

## 2018-06-21 MED ORDER — GUAIFENESIN-CODEINE 100-10 MG/5ML PO SYRP: 5.0000 mL | ORAL_SOLUTION | Freq: Three times a day (TID) | ORAL | 0 refills | Status: DC | PRN

## 2018-06-21 MED ORDER — AMOXICILLIN 875 MG PO TABS: 875.0000 mg | ORAL_TABLET | Freq: Two times a day (BID) | ORAL | 0 refills | Status: DC

## 2018-06-21 MED ORDER — ALBUTEROL SULFATE HFA 108 (90 BASE) MCG/ACT IN AERS: 2.0000 | INHALATION_SPRAY | RESPIRATORY_TRACT | 1 refills | Status: DC | PRN

## 2018-06-21 MED ORDER — IPRATROPIUM-ALBUTEROL 0.5-2.5 (3) MG/3ML IN SOLN
3.0000 mL | Freq: Once | RESPIRATORY_TRACT | Status: AC
  Administered 2018-06-21: 3 mL via RESPIRATORY_TRACT

## 2018-06-21 NOTE — Progress Notes (Signed)
Subjective:    Patient ID: Teresa Mcgee, female    DOB: November 17, 1965, 53 y.o.   MRN: [id number]  HPI This is a 52 yo female who presents today with cough x 6 days. She does not have any sore throat, some ear pain, feels warm. No headache. Cough mostly dry. Ribs sore. Has been taking Theraflu and Mucinex with little relief. Some wheeze, no SOB. Husband has had cough x 3 weeks. She stopped smoking earlier this year.  Unable to wear CPAP at night due to cough.  Past Medical History:  Diagnosis Date  . Arthritis   . Bell's palsy may 2012  . Chest pain   . GERD (gastroesophageal reflux disease)   . H/O hiatal hernia   . Hyperlipidemia   . Hypertension   . Hypothyroidism   . Shingles   . Shortness of breath   . Sleep apnea    uses cpap   Past Surgical History:  Procedure Laterality Date  . CESAREAN SECTION    . cesarian     3x  . PARTIAL HYSTERECTOMY  2000   abnormal uterine bleeding  . THYROIDECTOMY  1998  . TOOTH EXTRACTION    . TUBAL LIGATION     Family History  Problem Relation Age of Onset  . Heart attack Father 33       deceased  . Diabetes Brother   . Stroke Brother 25       twice  . Stroke Sister 83  . Diabetes Sister   . Heart attack Sister 83  . Heart attack Brother 36   Social History   Tobacco Use  . Smoking status: Former Smoker    Years: 20.00    Last attempt to quit: 04/29/2018    Years since quitting: 0.1  . Smokeless tobacco: Never Used  . Tobacco comment: used to smoke 1.5 - 2 PPD  Substance Use Topics  . Alcohol use: No  . Drug use: No      Review of Systems Per HPI    Objective:   Physical Exam  Constitutional: She is oriented to person, place, and time. She appears well-developed and well-nourished. No distress.  HENT:  Head: Normocephalic and atraumatic.  Right Ear: Tympanic membrane, external ear and ear canal normal.  Left Ear: Tympanic membrane, external ear and ear canal normal.  Nose: Mucosal edema present.    Mouth/Throat: Uvula is midline, oropharynx is clear and moist and mucous membranes are normal.  Eyes: Conjunctivae are normal.  Neck: Normal range of motion. Neck supple.  Cardiovascular: Normal rate, regular rhythm and normal heart sounds.  Pulmonary/Chest: Effort normal. She has wheezes (few scattered expiratory).  Lymphadenopathy:    She has no cervical adenopathy.  Neurological: She is alert and oriented to person, place, and time.  Skin: Skin is warm and dry. She is not diaphoretic.  Psychiatric: She has a normal mood and affect. Her behavior is normal. Judgment and thought content normal.  Vitals reviewed.     BP 128/80 (BP Location: Right Arm, Patient Position: Sitting, Cuff Size: Large)   Pulse 74   Temp 98.5 F (36.9 C) (Oral)   Ht 5\' 3"  (1.6 m)   Wt 267 lb (121.1 kg)   SpO2 96%   BMI 47.30 kg/m  Wt Readings from Last 3 Encounters:  06/21/18 267 lb (121.1 kg)  05/29/18 262 lb (118.8 kg)  05/27/18 246 lb (111.6 kg)   Duoneb inhaler in office with subjective improvement of breathing and cough. Improved  airflow and resolution of wheezes on auscultation.     Assessment & Plan:  1. Cough - ipratropium-albuterol (DUONEB) 0.5-2.5 (3) MG/3ML nebulizer solution 3 mL - guaiFENesin-codeine (ROBITUSSIN AC) 100-10 MG/5ML syrup; Take 5 mLs by mouth 3 (three) times daily as needed for cough.  Dispense: 120 mL; Refill: 0  2. Wheeze - ipratropium-albuterol (DUONEB) 0.5-2.5 (3) MG/3ML nebulizer solution 3 mL  3. Lower respiratory infection - Provided written and verbal information regarding diagnosis and treatment. - RTC/ER precautions reviewed - guaiFENesin-codeine (ROBITUSSIN AC) 100-10 MG/5ML syrup; Take 5 mLs by mouth 3 (three) times daily as needed for cough.  Dispense: 120 mL; Refill: 0 - amoxicillin (AMOXIL) 875 MG tablet; Take 1 tablet (875 mg total) by mouth 2 (two) times daily.  Dispense: 14 tablet; Refill: 0   Clarene Reamer, FNP-BC  [facility], [facility]  06/21/2018 5:36 PM

## 2018-06-21 NOTE — Telephone Encounter (Signed)
Pt seen 03/22/18.

## 2018-06-21 NOTE — Patient Instructions (Signed)
Good to see you today  I have sent in a cough syrup, antibiotic and inhaler  Use your inhaler every 4-6 hours today and tomorrow then as needed for cough/wheeze  If not better in 4-5 days, please come back in to see me!

## 2018-06-24 ENCOUNTER — Other Ambulatory Visit: Payer: Self-pay | Admitting: Pain Medicine

## 2018-06-24 DIAGNOSIS — M150 Primary generalized (osteo)arthritis: Principal | ICD-10-CM

## 2018-06-24 DIAGNOSIS — M159 Polyosteoarthritis, unspecified: Secondary | ICD-10-CM

## 2018-06-27 ENCOUNTER — Telehealth: Payer: Self-pay

## 2018-06-27 NOTE — Telephone Encounter (Signed)
Schedule patient to have med refill with Crystal.

## 2018-06-27 NOTE — Telephone Encounter (Signed)
Pt wanting refill on meloxican, she is out.I don't see where pt was scheduled to return for med Mgt

## 2018-07-01 ENCOUNTER — Encounter: Payer: Self-pay | Admitting: Nurse Practitioner

## 2018-07-01 ENCOUNTER — Other Ambulatory Visit: Payer: Self-pay

## 2018-07-01 ENCOUNTER — Ambulatory Visit: Payer: Medicare HMO | Attending: Nurse Practitioner | Admitting: Nurse Practitioner

## 2018-07-01 VITALS — BP 127/77 | HR 73 | Temp 97.7°F | Ht 63.0 in | Wt 267.0 lb

## 2018-07-01 DIAGNOSIS — M7918 Myalgia, other site: Secondary | ICD-10-CM | POA: Diagnosis not present

## 2018-07-01 DIAGNOSIS — K5900 Constipation, unspecified: Secondary | ICD-10-CM | POA: Diagnosis not present

## 2018-07-01 DIAGNOSIS — Z6841 Body Mass Index (BMI) 40.0 and over, adult: Secondary | ICD-10-CM | POA: Insufficient documentation

## 2018-07-01 DIAGNOSIS — E559 Vitamin D deficiency, unspecified: Secondary | ICD-10-CM | POA: Diagnosis not present

## 2018-07-01 DIAGNOSIS — M47817 Spondylosis without myelopathy or radiculopathy, lumbosacral region: Secondary | ICD-10-CM

## 2018-07-01 DIAGNOSIS — R7302 Impaired glucose tolerance (oral): Secondary | ICD-10-CM | POA: Insufficient documentation

## 2018-07-01 DIAGNOSIS — E669 Obesity, unspecified: Secondary | ICD-10-CM | POA: Diagnosis not present

## 2018-07-01 DIAGNOSIS — Z7951 Long term (current) use of inhaled steroids: Secondary | ICD-10-CM | POA: Diagnosis not present

## 2018-07-01 DIAGNOSIS — Z791 Long term (current) use of non-steroidal anti-inflammatories (NSAID): Secondary | ICD-10-CM | POA: Diagnosis not present

## 2018-07-01 DIAGNOSIS — Z79890 Hormone replacement therapy (postmenopausal): Secondary | ICD-10-CM | POA: Diagnosis not present

## 2018-07-01 DIAGNOSIS — E785 Hyperlipidemia, unspecified: Secondary | ICD-10-CM | POA: Diagnosis not present

## 2018-07-01 DIAGNOSIS — M5134 Other intervertebral disc degeneration, thoracic region: Secondary | ICD-10-CM | POA: Insufficient documentation

## 2018-07-01 DIAGNOSIS — Z87891 Personal history of nicotine dependence: Secondary | ICD-10-CM | POA: Insufficient documentation

## 2018-07-01 DIAGNOSIS — Z833 Family history of diabetes mellitus: Secondary | ICD-10-CM | POA: Insufficient documentation

## 2018-07-01 DIAGNOSIS — M533 Sacrococcygeal disorders, not elsewhere classified: Secondary | ICD-10-CM | POA: Insufficient documentation

## 2018-07-01 DIAGNOSIS — Z823 Family history of stroke: Secondary | ICD-10-CM | POA: Insufficient documentation

## 2018-07-01 DIAGNOSIS — I10 Essential (primary) hypertension: Secondary | ICD-10-CM | POA: Diagnosis not present

## 2018-07-01 DIAGNOSIS — M542 Cervicalgia: Secondary | ICD-10-CM

## 2018-07-01 DIAGNOSIS — E7800 Pure hypercholesterolemia, unspecified: Secondary | ICD-10-CM | POA: Insufficient documentation

## 2018-07-01 DIAGNOSIS — G8929 Other chronic pain: Secondary | ICD-10-CM

## 2018-07-01 DIAGNOSIS — Z8249 Family history of ischemic heart disease and other diseases of the circulatory system: Secondary | ICD-10-CM | POA: Insufficient documentation

## 2018-07-01 DIAGNOSIS — Z90710 Acquired absence of both cervix and uterus: Secondary | ICD-10-CM | POA: Insufficient documentation

## 2018-07-01 DIAGNOSIS — G894 Chronic pain syndrome: Secondary | ICD-10-CM | POA: Diagnosis not present

## 2018-07-01 DIAGNOSIS — M150 Primary generalized (osteo)arthritis: Secondary | ICD-10-CM

## 2018-07-01 DIAGNOSIS — Z79899 Other long term (current) drug therapy: Secondary | ICD-10-CM | POA: Diagnosis not present

## 2018-07-01 DIAGNOSIS — M792 Neuralgia and neuritis, unspecified: Secondary | ICD-10-CM

## 2018-07-01 DIAGNOSIS — M5030 Other cervical disc degeneration, unspecified cervical region: Secondary | ICD-10-CM | POA: Insufficient documentation

## 2018-07-01 DIAGNOSIS — F329 Major depressive disorder, single episode, unspecified: Secondary | ICD-10-CM | POA: Diagnosis not present

## 2018-07-01 DIAGNOSIS — K219 Gastro-esophageal reflux disease without esophagitis: Secondary | ICD-10-CM | POA: Insufficient documentation

## 2018-07-01 DIAGNOSIS — Z79891 Long term (current) use of opiate analgesic: Secondary | ICD-10-CM | POA: Insufficient documentation

## 2018-07-01 DIAGNOSIS — M47812 Spondylosis without myelopathy or radiculopathy, cervical region: Secondary | ICD-10-CM | POA: Insufficient documentation

## 2018-07-01 DIAGNOSIS — M549 Dorsalgia, unspecified: Secondary | ICD-10-CM | POA: Diagnosis present

## 2018-07-01 DIAGNOSIS — M159 Polyosteoarthritis, unspecified: Secondary | ICD-10-CM

## 2018-07-01 DIAGNOSIS — G4730 Sleep apnea, unspecified: Secondary | ICD-10-CM | POA: Insufficient documentation

## 2018-07-01 DIAGNOSIS — E890 Postprocedural hypothyroidism: Secondary | ICD-10-CM | POA: Insufficient documentation

## 2018-07-01 MED ORDER — METHOCARBAMOL 750 MG PO TABS: 750.0000 mg | ORAL_TABLET | Freq: Three times a day (TID) | ORAL | 0 refills | Status: DC | PRN

## 2018-07-01 MED ORDER — GABAPENTIN 100 MG PO CAPS: 100.0000 mg | ORAL_CAPSULE | Freq: Four times a day (QID) | ORAL | 0 refills | Status: DC

## 2018-07-01 MED ORDER — MELOXICAM 15 MG PO TABS: 15.0000 mg | ORAL_TABLET | Freq: Every day | ORAL | 0 refills | Status: DC

## 2018-07-01 NOTE — Progress Notes (Signed)
Patient's Name: Teresa Mcgee  MRN: [id number]  Referring Provider: Elby Beck, FNP  DOB: 01/07/1966  PCP: Elby Beck, FNP  DOS: 07/01/2018  Note by: Vevelyn Francois NP  Service setting: Ambulatory outpatient  Specialty: Interventional Pain Management  Location: [facility] (AMB) Pain Management Facility    Patient type: Established    Primary Reason(s) for Visit: Encounter for prescription drug management. (Level of risk: moderate)  CC: Back Pain  HPI  Teresa Mcgee is a 52 y.o. year old, female patient, who comes today for a medication management evaluation. She has Hypothyroidism; HYPERCHOLESTEROLEMIA; TOBACCO ABUSE; DEPRESSION; HYPERTENSION, BENIGN ESSENTIAL; GERD; Sleep apnea; Fatigue; Nasal septal perforation; Muscle spasms of neck; Nasal mucositis (ulcerative); Numbness and tingling in both hands; Xerotic eczema; Obesity (BMI 35.0-39.9 without comorbidity); Constipation; Chest pain; Impaired glucose tolerance; Osteopathic Exam Findings (Somatic Dysfunction); Chronic low back pain (Bilateral) (L>R) w/ sciatica (Bilateral); Chronic lower extremity pain ( Secondary  Area of  Pain) (Bilateral) (L>R); Chronic neck pain (Tertiary Area of Pain) (Bilateral) (L>R); Chronic pain syndrome; Long term current use of opiate analgesic; Pharmacologic therapy; Disorder of skeletal system; Problems influencing health status; Chronic upper extremity pain (Fourth Area of Pain) (Bilateral) (L>R); Vitamin D deficiency; Cervicalgia; Chronic musculoskeletal pain; DDD (degenerative disc disease), thoracic; DDD (degenerative disc disease), cervical; Lumbar facet arthropathy (Bilateral); Lumbar facet syndrome (Bilateral); Chronic low back pain (Primary Area of Pain) (Bilateral) (L>R) w/o sciatica; Spondylosis without myelopathy or radiculopathy, cervical region; Spondylosis without myelopathy or radiculopathy, lumbosacral region; DDD (degenerative disc disease), lumbosacral; Strain of lumbar paraspinal muscle,  sequela; Chronic hip pain (Bilateral) (R>L); Chronic sacroiliac joint pain (Left); Neurogenic pain; Osteoarthritis involving multiple joints; and Chronic knee pain (Bilateral) (R>L) on their problem list. Her primarily concern today is the Back Pain  Pain Assessment: Location: Right, Left, Lower Back Radiating: pain radiaties down legs Onset: More than a month ago Duration: Chronic pain Quality: Sharp, Aching, Numbness Severity: 4 /10 (subjective, self-reported pain score)  Note: Reported level is compatible with observation.                          Effect on ADL: limit my day activites Timing: Constant Modifying factors: lying down on my back BP: 127/77  HR: 73  Teresa Mcgee was last scheduled for an appointment on 01/24/2018 for medication management. During today's appointment we reviewed Teresa Mcgee's chronic pain status, as well as her outpatient medication regimen.  She admits that the leg pain is occasional.  Pain goes down the back of her legs.  She is requesting something additional for pain stronger than meloxicam.  The patient  reports that she does not use drugs. Her body mass index is 47.3 kg/m.  Further details on both, my assessment(s), as well as the proposed treatment plan, please see below.  Controlled Substance Pharmacotherapy Assessment REMS (Risk Evaluation and Mitigation Strategy)  Analgesic: None MME/day: 0 mg/day.  No notes on file Pharmacokinetics: Liberation and absorption (onset of action): WNL Distribution (time to peak effect): WNL Metabolism and excretion (duration of action): WNL         Pharmacodynamics: Desired effects: Analgesia: Teresa Mcgee reports >50% benefit. Functional ability: Patient reports that medication does help, but not nearly as much as she would like Clinically meaningful improvement in function (CMIF): Sustained CMIF goals met Perceived effectiveness: Described as relatively effective, allowing for increase in activities of daily  living (ADL) Undesirable effects: Side-effects or Adverse reactions: None reported Monitoring: Teresa Mcgee PMP:  Online review of the past 27-monthperiod conducted. Compliant with practice rules and regulations Last UDS on record: Summary  Date Value Ref Range Status  01/24/2018 FINAL  Final    Comment:    ==================================================================== TOXAS[facility]RE COMP DRUG ANALYSIS,UR ==================================================================== Test                             Result       Flag       Units Drug Present and Declared for Prescription Verification   Gabapentin                     PRESENT      EXPECTED   Naproxen                       PRESENT      EXPECTED ==================================================================== Test                      Result    Flag   Units      Ref Range   Creatinine              145              mg/dL      >=20 ==================================================================== Declared Medications:  The flagging and interpretation on this report are based on the  following declared medications.  Unexpected results may arise from  inaccuracies in the declared medications.  **Note: The testing scope of this panel includes these medications:  Gabapentin  Naproxen  **Note: The testing scope of this panel does not include following  reported medications:  Amlodipine (Norvasc)  Cetirizine (Zyrtec)  Eye Drops  Fluticasone (Flonase)  Hydrochlorothiazide  Ketotifen (Zaditor)  Levothyroxine ==================================================================== For clinical consultation, please call [phone] ====================================================================    UDS interpretation: Compliant          Medication Assessment Form: Reviewed. Patient indicates being compliant with therapy Treatment compliance: Compliant Risk Assessment Profile: Aberrant behavior: See prior evaluations. None  observed or detected today Comorbid factors increasing risk of overdose: See prior notes. No additional risks detected today Opioid risk tool (ORT) (Total Score): 0 Personal History of Substance Abuse (SUD-Substance use disorder):  Alcohol: Negative  Illegal Drugs: Negative  Rx Drugs:    ORT Risk Level calculation: Low Risk Risk of substance use disorder (SUD): Low Opioid Risk Tool - 07/01/18 0909      Family History of Substance Abuse   Alcohol  Negative    Illegal Drugs  Negative    Rx Drugs  Negative      Personal History of Substance Abuse   Alcohol  Negative    Illegal Drugs  Negative      Age   Age between 143-45years   No      History of Preadolescent Sexual Abuse   History of Preadolescent Sexual Abuse  Negative or Female      Psychological Disease   Psychological Disease  Negative    Depression  Negative      Total Score   Opioid Risk Tool Scoring  0    Opioid Risk Interpretation  Low Risk      ORT Scoring interpretation table:  Score <3 = Low Risk for SUD  Score between 4-7 = Moderate Risk for SUD  Score >8 = High Risk for Opioid Abuse   Risk Mitigation Strategies:  Patient Counseling: Covered Patient-Prescriber Agreement (PPA):  Present and active  Notification to other healthcare providers: Done  Pharmacologic Plan: No change in therapy, at this time.             Laboratory Chemistry  Inflammation Markers (CRP: Acute Phase) (ESR: Chronic Phase) Lab Results  Component Value Date   CRP 5.5 (H) 01/24/2018   ESRSEDRATE 40 01/24/2018                         Rheumatology Markers No results found for: RF, ANA, LABURIC, URICUR, LYMEIGGIGMAB, LYMEABIGMQN, HLAB27                      Renal Function Markers Lab Results  Component Value Date   BUN 12 05/25/2018   CREATININE 0.99 05/25/2018   BCR 8 (L) 01/24/2018   GFRAA >60 05/25/2018   GFRNONAA >60 05/25/2018                             Hepatic Function Markers Lab Results  Component Value Date    AST 40 (H) 05/29/2018   ALT 44 (H) 05/29/2018   ALBUMIN 4.4 05/29/2018   ALKPHOS 73 05/29/2018   LIPASE 25 05/25/2018                        Electrolytes Lab Results  Component Value Date   NA 140 05/25/2018   K 3.7 05/25/2018   CL 100 05/25/2018   CALCIUM 9.7 05/25/2018   MG 2.0 01/24/2018                        Neuropathy Markers Lab Results  Component Value Date   VITAMINB12 356 01/24/2018   HGBA1C 7.2 (H) 05/29/2018   HIV NON REACTIVE 03/21/2011                        CNS Tests No results found for: COLORCSF, APPEARCSF, RBCCOUNTCSF, WBCCSF, POLYSCSF, LYMPHSCSF, EOSCSF, PROTEINCSF, GLUCCSF, JCVIRUS, CSFOLI, IGGCSF                      Bone Pathology Markers Lab Results  Component Value Date   25OHVITD1 9.3 (L) 01/24/2018   25OHVITD2 <1.0 01/24/2018   25OHVITD3 9.3 01/24/2018                         Coagulation Parameters Lab Results  Component Value Date   PLT 238 05/25/2018   DDIMER 0.69 (H) 01/11/2012                        Cardiovascular Markers Lab Results  Component Value Date   CKTOTAL 586 (H) 01/11/2012   CKMB 5.2 (H) 01/11/2012   TROPONINI 0.03 (HH) 05/25/2018   HGB 14.5 05/25/2018   HCT 44.0 05/25/2018                         CA Markers No results found for: CEA, CA125, LABCA2                      Note: Lab results reviewed.  Recent Diagnostic Imaging Results  CT ABDOMEN PELVIS W CONTRAST CLINICAL DATA:  Patient with shortness of breath. Upper abdominal pain.  EXAM: CT ANGIOGRAPHY CHEST  CT ABDOMEN AND PELVIS  WITH CONTRAST  TECHNIQUE: Multidetector CT imaging of the chest was performed using the standard protocol during bolus administration of intravenous contrast. Multiplanar CT image reconstructions and MIPs were obtained to evaluate the vascular anatomy. Multidetector CT imaging of the abdomen and pelvis was performed using the standard protocol during bolus administration of intravenous contrast.  CONTRAST:  173m ISOVUE-370  IOPAMIDOL (ISOVUE-370) INJECTION 76%  COMPARISON:  CT abdomen pelvis 05/02/2014  FINDINGS: CTA CHEST FINDINGS  Cardiovascular: Normal heart size. No pericardial effusion. Aorta and main pulmonary artery normal in caliber. Adequate opacification of the pulmonary arterial system. No evidence for acute pulmonary embolus.  Mediastinum/Nodes: No enlarged axillary, mediastinal or hilar lymphadenopathy. Normal appearance of the esophagus.  Lungs/Pleura: Central airways are patent. Dependent atelectasis within the bilateral lower lobes. No large area pulmonary consolidation. No pleural effusion or pneumothorax.  Musculoskeletal: No aggressive or acute appearing osseous lesions. Thoracic spine degenerative changes.  Review of the MIP images confirms the above findings.  CT ABDOMEN and PELVIS FINDINGS  Hepatobiliary: Liver is normal in size and contour. No focal hepatic lesion is identified. Gallbladder is unremarkable. No intrahepatic or extrahepatic biliary ductal dilatation.  Pancreas: Unremarkable  Spleen: Unremarkable  Adrenals/Urinary Tract: Normal adrenal glands. Subcentimeter too small to characterize low-attenuation lesion superior pole right kidney. Kidneys enhance symmetrically with contrast. No hydronephrosis. Urinary bladder is unremarkable.  Stomach/Bowel: No abnormal bowel wall thickening or evidence for bowel obstruction. No free fluid or free intraperitoneal air. Normal morphology of the stomach.  Vascular/Lymphatic: Normal caliber abdominal aorta. Peripheral calcified atherosclerotic plaque. No retroperitoneal lymphadenopathy.  Reproductive: Status post hysterectomy.  Other: None.  Musculoskeletal: No aggressive or acute appearing osseous lesions. Lumbar spine degenerative changes.  Review of the MIP images confirms the above findings.  IMPRESSION: 1. No evidence for acute pulmonary embolus. 2. No acute process within the abdomen or  pelvis.  Electronically Signed   By: DLovey NewcomerM.D.   On: 05/25/2018 15:37 CT Angio Chest PE W and/or Wo Contrast CLINICAL DATA:  Patient with shortness of breath. Upper abdominal pain.  EXAM: CT ANGIOGRAPHY CHEST  CT ABDOMEN AND PELVIS WITH CONTRAST  TECHNIQUE: Multidetector CT imaging of the chest was performed using the standard protocol during bolus administration of intravenous contrast. Multiplanar CT image reconstructions and MIPs were obtained to evaluate the vascular anatomy. Multidetector CT imaging of the abdomen and pelvis was performed using the standard protocol during bolus administration of intravenous contrast.  CONTRAST:  1024mISOVUE-370 IOPAMIDOL (ISOVUE-370) INJECTION 76%  COMPARISON:  CT abdomen pelvis 05/02/2014  FINDINGS: CTA CHEST FINDINGS  Cardiovascular: Normal heart size. No pericardial effusion. Aorta and main pulmonary artery normal in caliber. Adequate opacification of the pulmonary arterial system. No evidence for acute pulmonary embolus.  Mediastinum/Nodes: No enlarged axillary, mediastinal or hilar lymphadenopathy. Normal appearance of the esophagus.  Lungs/Pleura: Central airways are patent. Dependent atelectasis within the bilateral lower lobes. No large area pulmonary consolidation. No pleural effusion or pneumothorax.  Musculoskeletal: No aggressive or acute appearing osseous lesions. Thoracic spine degenerative changes.  Review of the MIP images confirms the above findings.  CT ABDOMEN and PELVIS FINDINGS  Hepatobiliary: Liver is normal in size and contour. No focal hepatic lesion is identified. Gallbladder is unremarkable. No intrahepatic or extrahepatic biliary ductal dilatation.  Pancreas: Unremarkable  Spleen: Unremarkable  Adrenals/Urinary Tract: Normal adrenal glands. Subcentimeter too small to characterize low-attenuation lesion superior pole right kidney. Kidneys enhance symmetrically with contrast.  No hydronephrosis. Urinary bladder is unremarkable.  Stomach/Bowel: No abnormal bowel wall thickening  or evidence for bowel obstruction. No free fluid or free intraperitoneal air. Normal morphology of the stomach.  Vascular/Lymphatic: Normal caliber abdominal aorta. Peripheral calcified atherosclerotic plaque. No retroperitoneal lymphadenopathy.  Reproductive: Status post hysterectomy.  Other: None.  Musculoskeletal: No aggressive or acute appearing osseous lesions. Lumbar spine degenerative changes.  Review of the MIP images confirms the above findings.  IMPRESSION: 1. No evidence for acute pulmonary embolus. 2. No acute process within the abdomen or pelvis.  Electronically Signed   By: Lovey Newcomer M.D.   On: 05/25/2018 15:37 DG Chest 2 View CLINICAL DATA:  Epigastric pain with reflux for 1 week.  EXAM: CHEST - 2 VIEW  COMPARISON:  Chest x-ray dated 06/28/2016.  FINDINGS: Heart size and mediastinal contours are within normal limits. Lungs are clear. No pleural effusion or pneumothorax seen. No acute or suspicious osseous finding.  IMPRESSION: No active cardiopulmonary disease.  Electronically Signed   By: Franki Cabot M.D.   On: 05/25/2018 14:00  Complexity Note: Imaging results reviewed. Results shared with Ms. Gumz, using Layman's terms.                         Meds   Current Outpatient Medications:  .  albuterol (PROVENTIL HFA;VENTOLIN HFA) 108 (90 Base) MCG/ACT inhaler, Inhale 2 puffs into the lungs every 4 (four) hours as needed for wheezing or shortness of breath (cough, shortness of breath or wheezing.)., Disp: 1 Inhaler, Rfl: 1 .  amLODipine (NORVASC) 10 MG tablet, Take 1 tablet (10 mg total) by mouth daily., Disp: 90 tablet, Rfl: 3 .  amoxicillin (AMOXIL) 875 MG tablet, Take 1 tablet (875 mg total) by mouth 2 (two) times daily., Disp: 14 tablet, Rfl: 0 .  Calcium Carbonate-Vit D-Min (GNP CALCIUM 1200) 1200-1000 MG-UNIT CHEW, Chew 1,200 mg by mouth  daily with breakfast. Take in combination with vitamin D and magnesium., Disp: 30 tablet, Rfl: 5 .  carboxymethylcellulose 1 % ophthalmic solution, Apply 1 drop to eye 3 (three) times daily., Disp: 30 mL, Rfl: 12 .  cetirizine (ZYRTEC) 10 MG tablet, Take 1 tablet (10 mg total) by mouth daily., Disp: 90 tablet, Rfl: 3 .  [employer] D3 125 MCG (5000 UT) capsule, TAKE 1 CAPSULE BY MOUTH DAILY WITH BREAKFAST. TAKE ALONG WITH CALCIUM AND MAGNESIUM., Disp: , Rfl: 5 .  fluticasone (FLONASE) 50 MCG/ACT nasal spray, Place 2 sprays into both nostrils daily., Disp: 16 g, Rfl: 6 .  hydrochlorothiazide (HYDRODIURIL) 25 MG tablet, Take 1 tablet (25 mg total) by mouth daily. as directed, Disp: 90 tablet, Rfl: 3 .  levothyroxine (SYNTHROID, LEVOTHROID) 112 MCG tablet, Take 1 tablet (112 mcg total) by mouth daily., Disp: 90 tablet, Rfl: 1 .  Magnesium 500 MG CAPS, Take 1 capsule (500 mg total) by mouth 2 (two) times daily at 8 am and 10 pm., Disp: 60 capsule, Rfl: 5 .  methocarbamol (ROBAXIN) 750 MG tablet, Take 1 tablet (750 mg total) by mouth every 8 (eight) hours as needed for muscle spasms., Disp: 90 tablet, Rfl: 0 .  REFRESH CELLUVISC 1 % GEL, APPLY 1 DROP TO EYE 3 (THREE) TIMES DAILY., Disp: , Rfl: 12 .  gabapentin (NEURONTIN) 100 MG capsule, Take 1-3 capsules (100-300 mg total) by mouth 4 (four) times daily. Follow written titration schedule., Disp: 360 capsule, Rfl: 0 .  meloxicam (MOBIC) 15 MG tablet, Take 1 tablet (15 mg total) by mouth daily., Disp: 30 tablet, Rfl: 0  ROS  Constitutional: Denies any fever or  chills Gastrointestinal: No reported hemesis, hematochezia, vomiting, or acute GI distress Musculoskeletal: Denies any acute onset joint swelling, redness, loss of ROM, or weakness Neurological: No reported episodes of acute onset apraxia, aphasia, dysarthria, agnosia, amnesia, paralysis, loss of coordination, or loss of consciousness  Allergies  Ms. Fake has No Known Allergies.  [place]  Drug: Ms.  Croll  reports that she does not use drugs. Alcohol:  reports that she does not drink alcohol. Tobacco:  reports that she quit smoking about 2 months ago. She quit after 20.00 years of use. She has never used smokeless tobacco. Medical:  has a past medical history of Arthritis, Bell's palsy (may 2012), Chest pain, GERD (gastroesophageal reflux disease), H/O hiatal hernia, Hyperlipidemia, Hypertension, Hypothyroidism, Shingles, Shortness of breath, and Sleep apnea. Surgical: Ms. Bugge  has a past surgical history that includes Thyroidectomy (1998); cesarian; Partial hysterectomy (2000); Cesarean section; Tubal ligation; and Tooth extraction. Family: family history includes Diabetes in her brother and sister; Heart attack (age of onset: 55) in her brother; Heart attack (age of onset: 7) in her sister; Heart attack (age of onset: 83) in her father; Stroke (age of onset: 48) in her brother; Stroke (age of onset: 35) in her sister.  Constitutional Exam  General appearance: Well nourished, well developed, and well hydrated. In no apparent acute distress Vitals:   07/01/18 0901  BP: 127/77  Pulse: 73  Temp: 97.7 F (36.5 C)  SpO2: 98%  Weight: 267 lb (121.1 kg)  Height: '5\' 3"'  (1.6 m)   BMI Assessment: Estimated body mass index is 47.3 kg/m as calculated from the following:   Height as of this encounter: '5\' 3"'  (1.6 m).   Weight as of this encounter: 267 lb (121.1 kg). Psych/Mental status: Alert, oriented x 3 (person, place, & time)       Eyes: PERLA Respiratory: No evidence of acute respiratory distress  Lumbar Spine Area Exam  Skin & Axial Inspection: No masses, redness, or swelling Alignment: Symmetrical Functional ROM: Unrestricted ROM       Stability: No instability detected Muscle Tone/Strength: Functionally intact. No obvious neuro-muscular anomalies detected. Sensory (Neurological): Unimpaired Palpation: No palpable anomalies       Provocative Tests: Hyperextension/rotation  test: Positive bilaterally for facet joint pain.   Gait & Posture Assessment  Ambulation: Unassisted Gait: Relatively normal for age and body habitus Posture: WNL   Lower Extremity Exam    Side: Right lower extremity  Side: Left lower extremity  Stability: No instability observed          Stability: No instability observed          Skin & Extremity Inspection: Skin color, temperature, and hair growth are WNL. No peripheral edema or cyanosis. No masses, redness, swelling, asymmetry, or associated skin lesions. No contractures.  Skin & Extremity Inspection: Skin color, temperature, and hair growth are WNL. No peripheral edema or cyanosis. No masses, redness, swelling, asymmetry, or associated skin lesions. No contractures.  Functional ROM: Unrestricted ROM                  Functional ROM: Unrestricted ROM                  Muscle Tone/Strength: Functionally intact. No obvious neuro-muscular anomalies detected.  Muscle Tone/Strength: Functionally intact. No obvious neuro-muscular anomalies detected.   Assessment  Primary Diagnosis & Pertinent Problem List: The primary encounter diagnosis was Spondylosis without myelopathy or radiculopathy, lumbosacral region. Diagnoses of Cervicalgia, Osteoarthritis involving multiple joints, Neurogenic pain,  Chronic musculoskeletal pain, and Chronic pain syndrome were also pertinent to this visit.  Status Diagnosis  Persistent Controlled Controlled 1. Spondylosis without myelopathy or radiculopathy, lumbosacral region   2. Cervicalgia   3. Osteoarthritis involving multiple joints   4. Neurogenic pain   5. Chronic musculoskeletal pain   6. Chronic pain syndrome     Problems updated and reviewed during this visit: No problems updated. Plan of Care  Pharmacotherapy (Medications Ordered): Meds ordered this encounter  Medications  . methocarbamol (ROBAXIN) 750 MG tablet    Sig: Take 1 tablet (750 mg total) by mouth every 8 (eight) hours as needed for  muscle spasms.    Dispense:  90 tablet    Refill:  0    Do not place this medication, or any other prescription from our practice, on "Automatic Refill". Patient may have prescription filled one day early if pharmacy is closed on scheduled refill date.    Order Specific Question:   Supervising Provider    Answer:   Milinda Pointer [phone]  . gabapentin (NEURONTIN) 100 MG capsule    Sig: Take 1-3 capsules (100-300 mg total) by mouth 4 (four) times daily. Follow written titration schedule.    Dispense:  360 capsule    Refill:  0    Do not place medication on "Automatic Refill". Fill one day early if pharmacy is closed on scheduled refill date.    Order Specific Question:   Supervising Provider    Answer:   Milinda Pointer [phone]  . meloxicam (MOBIC) 15 MG tablet    Sig: Take 1 tablet (15 mg total) by mouth daily.    Dispense:  30 tablet    Refill:  0    Do not add this medication to the electronic "Automatic Refill" notification system. Patient may have prescription filled one day early if pharmacy is closed on scheduled refill date.    Order Specific Question:   Supervising Provider    Answer:   Milinda Pointer [[id number]]   New Prescriptions   No medications on file   Medications administered today: Marc Sivertsen. Szostak had no medications administered during this visit. Lab-work, procedure(s), and/or referral(s): No orders of the defined types were placed in this encounter.  Imaging and/or referral(s): None  Interventional management options: Planned, scheduled, and/or pending:   Diagnostic bilateral lumbar facet block #2under fluoroscopic guidance and IV sedation. Patient requested narcotic medication instructed patient that interventional therapy along with current regimen was her plan at this time.   Considering:   Diagnostic bilateral lumbar facet block Possible bilateral lumbar facet RFA Diagnosticleft-sided sacroiliac joint block Possible left-sided  sacroiliac joint RFA Diagnostic bilateral intra-articular hip joint injection Diagnostic bilateral femoral nerve + obturator nerve block Possible bilateral femoral nerve + obturator nerve RFA Diagnostic left-sided LESI Diagnostic bilateral transforaminal ESI Diagnostic left-sided CESI Diagnostic bilateral cervical facet block Possible bilateral cervical facet RFA Diagnostic bilateral intra-articular knee joint injections with local anesthetic and steroid Possible series of 5 bilateral intra-articular Hyalgan knee injections Diagnostic bilateral genicular nerve blocks Possible bilateral genicular nerve RFA Diagnostic trigger point injections   Palliative PRN treatment(s):   Palliative bilateral lumbar facet blockunder fluoroscopic guidance and IV sedation    Provider-requested follow-up: Return in about 3 months (around 10/01/2018) for MedMgmt.  Future Appointments  Date Time Provider [place]  07/23/2018 10:15 AM Milinda Pointer, MD [facility] None  07/30/2018  9:40 AM Minna Merritts, MD [id number]  09/24/2018 10:30 AM Vevelyn Francois, NP [facility] None  Primary Care Physician: Elby Beck, FNP [facility]: [facility] [facility] Note by: Vevelyn Francois NP Date: 07/01/2018; Time: 11:24 AM  Pain Score Disclaimer: We use the NRS-11 scale. This is a self-reported, subjective measurement of pain severity with only modest accuracy. It is used primarily to identify changes within a particular patient. It must be understood that outpatient pain scales are significantly less accurate that those used for research, where they can be applied under ideal controlled circumstances with minimal exposure to variables. In reality, the score is likely to be a combination of pain intensity and pain affect, where pain affect describes the degree of emotional arousal or changes in action readiness caused by the sensory experience of pain.  Factors such as social and work situation, setting, emotional state, anxiety levels, expectation, and prior pain experience may influence pain perception and show large inter-individual differences that may also be affected by time variables.  Patient instructions provided during this appointment: Patient Instructions

## 2018-07-13 ENCOUNTER — Other Ambulatory Visit: Payer: Self-pay | Admitting: Pain Medicine

## 2018-07-13 DIAGNOSIS — M792 Neuralgia and neuritis, unspecified: Secondary | ICD-10-CM

## 2018-07-23 ENCOUNTER — Encounter: Payer: Self-pay | Admitting: Pain Medicine

## 2018-07-23 ENCOUNTER — Ambulatory Visit
Admission: RE | Admit: 2018-07-23 | Discharge: 2018-07-23 | Disposition: A | Discharge status: Home / Self Care | Payer: Medicare HMO | Source: Ambulatory Visit | Attending: Pain Medicine | Admitting: Pain Medicine

## 2018-07-23 ENCOUNTER — Ambulatory Visit (HOSPITAL_BASED_OUTPATIENT_CLINIC_OR_DEPARTMENT_OTHER): Payer: Medicare HMO | Admitting: Pain Medicine

## 2018-07-23 ENCOUNTER — Other Ambulatory Visit: Payer: Self-pay | Admitting: Family Medicine

## 2018-07-23 ENCOUNTER — Other Ambulatory Visit: Payer: Self-pay

## 2018-07-23 VITALS — BP 127/72 | HR 62 | Temp 97.3°F | Resp 15 | Ht 63.0 in | Wt 267.0 lb

## 2018-07-23 DIAGNOSIS — M5137 Other intervertebral disc degeneration, lumbosacral region: Secondary | ICD-10-CM

## 2018-07-23 DIAGNOSIS — M47816 Spondylosis without myelopathy or radiculopathy, lumbar region: Secondary | ICD-10-CM

## 2018-07-23 DIAGNOSIS — M545 Low back pain: Secondary | ICD-10-CM | POA: Diagnosis present

## 2018-07-23 DIAGNOSIS — M5442 Lumbago with sciatica, left side: Secondary | ICD-10-CM

## 2018-07-23 DIAGNOSIS — G8929 Other chronic pain: Secondary | ICD-10-CM

## 2018-07-23 DIAGNOSIS — M5441 Lumbago with sciatica, right side: Secondary | ICD-10-CM | POA: Insufficient documentation

## 2018-07-23 DIAGNOSIS — M47817 Spondylosis without myelopathy or radiculopathy, lumbosacral region: Secondary | ICD-10-CM | POA: Diagnosis not present

## 2018-07-23 MED ORDER — LIDOCAINE HCL 2 % IJ SOLN
20.0000 mL | Freq: Once | INTRAMUSCULAR | Status: AC
  Administered 2018-07-23: 400 mg
  Filled 2018-07-23: qty 40

## 2018-07-23 MED ORDER — FENTANYL CITRATE (PF) 100 MCG/2ML IJ SOLN
25.0000 ug | INTRAMUSCULAR | Status: DC | PRN
  Administered 2018-07-23: 50 ug via INTRAVENOUS
  Filled 2018-07-23: qty 2

## 2018-07-23 MED ORDER — ROPIVACAINE HCL 2 MG/ML IJ SOLN
18.0000 mL | Freq: Once | INTRAMUSCULAR | Status: AC
  Administered 2018-07-23: 18 mL via PERINEURAL
  Filled 2018-07-23: qty 20

## 2018-07-23 MED ORDER — MIDAZOLAM HCL 5 MG/5ML IJ SOLN
1.0000 mg | INTRAMUSCULAR | Status: DC | PRN
  Administered 2018-07-23: 2 mg via INTRAVENOUS
  Filled 2018-07-23: qty 5

## 2018-07-23 MED ORDER — LIDOCAINE HCL 2 % IJ SOLN
INTRAMUSCULAR | Status: AC
  Filled 2018-07-23: qty 20

## 2018-07-23 MED ORDER — TRIAMCINOLONE ACETONIDE 40 MG/ML IJ SUSP
80.0000 mg | Freq: Once | INTRAMUSCULAR | Status: AC
  Administered 2018-07-23: 80 mg
  Filled 2018-07-23: qty 2

## 2018-07-23 MED ORDER — LACTATED RINGERS IV SOLN
1000.0000 mL | Freq: Once | INTRAVENOUS | Status: AC
  Administered 2018-07-23: 1000 mL via INTRAVENOUS

## 2018-07-23 NOTE — Patient Instructions (Signed)

## 2018-07-23 NOTE — Progress Notes (Signed)
Safety precautions to be maintained throughout the outpatient stay will include: orient to surroundings, keep bed in low position, maintain call bell within reach at all times, provide assistance with transfer out of bed and ambulation.  

## 2018-07-23 NOTE — Progress Notes (Addendum)
Patient's Name: Teresa Mcgee  MRN: [id number]  Referring Provider: Milinda Pointer, MD  DOB: 19-Aug-1965  PCP: Elby Beck, FNP  DOS: 07/23/2018  Note by: Gaspar Cola, MD  Service setting: Ambulatory outpatient  Specialty: Interventional Pain Management  Patient type: Established  Location: [facility] (AMB) Pain Management Facility  Visit type: Interventional Procedure   Primary Reason for Visit: Interventional Pain Management Treatment. CC: Back Pain (lower)  Procedure:          Anesthesia, Analgesia, Anxiolysis:  Type: Lumbar Facet, Medial Branch Block(s) #2  Primary Purpose: Diagnostic Region: Posterolateral Lumbosacral Spine Level: L2, L3, L4, L5, & S1 Medial Branch Level(s). Injecting these levels blocks the L3-4, L4-5, and L5-S1 lumbar facet joints. Laterality: Bilateral  Type: Moderate (Conscious) Sedation combined with Local Anesthesia Indication(s): Analgesia and Anxiety Route: Intravenous (IV) IV Access: Secured Sedation: Meaningful verbal contact was maintained at all times during the procedure  Local Anesthetic: Lidocaine 1-2%  Position: Prone   Indications: 1. Spondylosis without myelopathy or radiculopathy, lumbosacral region   2. Lumbar facet syndrome (Bilateral)   3. Lumbar facet arthropathy (Bilateral)   4. DDD (degenerative disc disease), lumbosacral   5. 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 Chronic low back pain (Primary Area of Pain) (Bilateral) (L>R) w/o sciatica    Pain Score: Pre-procedure: 6 /10 Post-procedure: 0-No pain/10  Pre-op Assessment:  Teresa Mcgee is a 52 y.o. (year old), female patient, seen today for interventional treatment. She  has a past surgical history that includes Thyroidectomy (1998); cesarian; Partial hysterectomy (2000); Cesarean section; Tubal ligation; and Tooth extraction. [name] has a current medication list which includes the following prescription(s): albuterol, amlodipine, gnp  calcium 1200, carboxymethylcellulose, cetirizine, cvs d3, fluticasone, gabapentin, hydrochlorothiazide, levothyroxine, magnesium, meloxicam, methocarbamol, refresh celluvisc, and amoxicillin, and the following Facility-Administered Medications: fentanyl and midazolam. Her primarily concern today is the Back Pain (lower)  Initial Vital Signs:  Pulse/HCG Rate: 67ECG Heart Rate: 64 Temp: 98.3 F (36.8 C) Resp: 18 BP: 131/84 SpO2: 98 %  BMI: Estimated body mass index is 47.3 kg/m as calculated from the following:   Height as of this encounter: 5\' 3"  (1.6 m).   Weight as of this encounter: 267 lb (121.1 kg).  Risk Assessment: Allergies: Reviewed. She has No Known Allergies.  Allergy Precautions: None required Coagulopathies: Reviewed. None identified.  Blood-thinner therapy: None at this time Active Infection(s): Reviewed. None identified. Teresa Mcgee is afebrile  Site Confirmation: Teresa Mcgee was asked to confirm the procedure and laterality before marking the site Procedure checklist: Completed Consent: Before the procedure and under the influence of no sedative(s), amnesic(s), or anxiolytics, the patient was informed of the treatment options, risks and possible complications. To fulfill our ethical and legal obligations, as recommended by the American Medical Association's Code of Ethics, I have informed the patient of my clinical impression; the nature and purpose of the treatment or procedure; the risks, benefits, and possible complications of the intervention; the alternatives, including doing nothing; the risk(s) and benefit(s) of the alternative treatment(s) or procedure(s); and the risk(s) and benefit(s) of doing nothing. The patient was provided information about the general risks and possible complications associated with the procedure. These may include, but are not limited to: failure to achieve desired goals, infection, bleeding, organ or nerve damage, allergic reactions, paralysis,  and death. In addition, the patient was informed of those risks and complications associated to Spine-related procedures, such as failure to decrease pain; infection (i.e.: Meningitis,  epidural or intraspinal abscess); bleeding (i.e.: epidural hematoma, subarachnoid hemorrhage, or any other type of intraspinal or peri-dural bleeding); organ or nerve damage (i.e.: Any type of peripheral nerve, nerve root, or spinal cord injury) with subsequent damage to sensory, motor, and/or autonomic systems, resulting in permanent pain, numbness, and/or weakness of one or several areas of the body; allergic reactions; (i.e.: anaphylactic reaction); and/or death. Furthermore, the patient was informed of those risks and complications associated with the medications. These include, but are not limited to: allergic reactions (i.e.: anaphylactic or anaphylactoid reaction(s)); adrenal axis suppression; blood sugar elevation that in diabetics may result in ketoacidosis or comma; water retention that in patients with history of congestive heart failure may result in shortness of breath, pulmonary edema, and decompensation with resultant heart failure; weight gain; swelling or edema; medication-induced neural toxicity; particulate matter embolism and blood vessel occlusion with resultant organ, and/or nervous system infarction; and/or aseptic necrosis of one or more joints. Finally, the patient was informed that Medicine is not an exact science; therefore, there is also the possibility of unforeseen or unpredictable risks and/or possible complications that may result in a catastrophic outcome. The patient indicated having understood very clearly. We have given the patient no guarantees and we have made no promises. Enough time was given to the patient to ask questions, all of which were answered to the patient's satisfaction. Teresa Mcgee has indicated that she wanted to continue with the procedure. Attestation: I, the ordering provider,  attest that I have discussed with the patient the benefits, risks, side-effects, alternatives, likelihood of achieving goals, and potential problems during recovery for the procedure that I have provided informed consent. Date  Time: 07/23/2018 10:07 AM  Pre-Procedure Preparation:  Monitoring: As per clinic protocol. Respiration, ETCO2, SpO2, BP, heart rate and rhythm monitor placed and checked for adequate function Safety Precautions: Patient was assessed for positional comfort and pressure points before starting the procedure. Time-out: I initiated and conducted the "Time-out" before starting the procedure, as per protocol. The patient was asked to participate by confirming the accuracy of the "Time Out" information. Verification of the correct person, site, and procedure were performed and confirmed by me, the nursing staff, and the patient. "Time-out" conducted as per Joint Commission's Universal Protocol (UP.01.01.01). Time: 1040  Description of Procedure:          Laterality: Bilateral. The procedure was performed in identical fashion on both sides. Levels:  L2, L3, L4, L5, & S1 Medial Branch Level(s) Area Prepped: Posterior Lumbosacral Region Prepping solution: ChloraPrep (2% chlorhexidine gluconate and 70% isopropyl alcohol) Safety Precautions: Aspiration looking for blood return was conducted prior to all injections. At no point did we inject any substances, as a needle was being advanced. Before injecting, the patient was told to immediately notify me if she was experiencing any new onset of "ringing in the ears, or metallic taste in the mouth". No attempts were made at seeking any paresthesias. Safe injection practices and needle disposal techniques used. Medications properly checked for expiration dates. SDV (single dose vial) medications used. After the completion of the procedure, all disposable equipment used was discarded in the proper designated medical waste containers. Local  Anesthesia: Protocol guidelines were followed. The patient was positioned over the fluoroscopy table. The area was prepped in the usual manner. The time-out was completed. The target area was identified using fluoroscopy. A 12-in long, straight, sterile hemostat was used with fluoroscopic guidance to locate the targets for each level blocked. Once located, the skin  was marked with an approved surgical skin marker. Once all sites were marked, the skin (epidermis, dermis, and hypodermis), as well as deeper tissues (fat, connective tissue and muscle) were infiltrated with a small amount of a short-acting local anesthetic, loaded on a 10cc syringe with a 25G, 1.5-in  Needle. An appropriate amount of time was allowed for local anesthetics to take effect before proceeding to the next step. Local Anesthetic: Lidocaine 2.0% The unused portion of the local anesthetic was discarded in the proper designated containers. Technical explanation of process:  L2 Medial Branch Nerve Block (MBB): The target area for the L2 medial branch is at the junction of the postero-lateral aspect of the superior articular process and the superior, posterior, and medial edge of the transverse process of L3. Under fluoroscopic guidance, a Quincke needle was inserted until contact was made with os over the superior postero-lateral aspect of the pedicular shadow (target area). After negative aspiration for blood, 0.5 mL of the nerve block solution was injected without difficulty or complication. The needle was removed intact. L3 Medial Branch Nerve Block (MBB): The target area for the L3 medial branch is at the junction of the postero-lateral aspect of the superior articular process and the superior, posterior, and medial edge of the transverse process of L4. Under fluoroscopic guidance, a Quincke needle was inserted until contact was made with os over the superior postero-lateral aspect of the pedicular shadow (target area). After negative  aspiration for blood, 0.5 mL of the nerve block solution was injected without difficulty or complication. The needle was removed intact. L4 Medial Branch Nerve Block (MBB): The target area for the L4 medial branch is at the junction of the postero-lateral aspect of the superior articular process and the superior, posterior, and medial edge of the transverse process of L5. Under fluoroscopic guidance, a Quincke needle was inserted until contact was made with os over the superior postero-lateral aspect of the pedicular shadow (target area). After negative aspiration for blood, 0.5 mL of the nerve block solution was injected without difficulty or complication. The needle was removed intact. L5 Medial Branch Nerve Block (MBB): The target area for the L5 medial branch is at the junction of the postero-lateral aspect of the superior articular process and the superior, posterior, and medial edge of the sacral ala. Under fluoroscopic guidance, a Quincke needle was inserted until contact was made with os over the superior postero-lateral aspect of the pedicular shadow (target area). After negative aspiration for blood, 0.5 mL of the nerve block solution was injected without difficulty or complication. The needle was removed intact. S1 Medial Branch Nerve Block (MBB): The target area for the S1 medial branch is at the posterior and inferior 6 o'clock position of the L5-S1 facet joint. Under fluoroscopic guidance, the Quincke needle inserted for the L5 MBB was redirected until contact was made with os over the inferior and postero aspect of the sacrum, at the 6 o' clock position under the L5-S1 facet joint (Target area). After negative aspiration for blood, 0.5 mL of the nerve block solution was injected without difficulty or complication. The needle was removed intact. Procedural Needles: 22-gauge, 3.5-inch, Quincke needles used for all levels. Nerve block solution: 0.2% PF-Ropivacaine + Triamcinolone (40 mg/mL) diluted  to a final concentration of 4 mg of Triamcinolone/mL of Ropivacaine The unused portion of the solution was discarded in the proper designated containers.  Once the entire procedure was completed, the treated area was cleaned, making sure to leave some of the  prepping solution back to take advantage of its long term bactericidal properties.   Illustration of the posterior view of the lumbar spine and the posterior neural structures. Laminae of L2 through S1 are labeled. DPRL5, dorsal primary ramus of L5; DPRS1, dorsal primary ramus of S1; DPR3, dorsal primary ramus of L3; FJ, facet (zygapophyseal) joint L3-L4; I, inferior articular process of L4; LB1, lateral branch of dorsal primary ramus of L1; IAB, inferior articular branches from L3 medial branch (supplies L4-L5 facet joint); IBP, intermediate branch plexus; MB3, medial branch of dorsal primary ramus of L3; NR3, third lumbar nerve root; S, superior articular process of L5; SAB, superior articular branches from L4 (supplies L4-5 facet joint also); TP3, transverse process of L3.  Vitals:   07/23/18 1053 07/23/18 1057 07/23/18 1108 07/23/18 1118  BP: (!) 137/101  118/77 127/72  Pulse:   62   Resp: 14 (!) 22 18 15   Temp:  (!) 97.3 F (36.3 C)  (!) 97.3 F (36.3 C)  TempSrc:  Temporal  Temporal  SpO2: 99% 99% 99% 100%  Weight:      Height:         Start Time: 1040 hrs. End Time: 1051 hrs.  Imaging Guidance (Spinal):          Type of Imaging Technique: Fluoroscopy Guidance (Spinal) Indication(s): Assistance in needle guidance and placement for procedures requiring needle placement in or near specific anatomical locations not easily accessible without such assistance. Exposure Time: Please see nurses notes. Contrast: None used. Fluoroscopic Guidance: I was personally present during the use of fluoroscopy. "Tunnel Vision Technique" used to obtain the best possible view of the target area. Parallax error corrected before commencing the  procedure. "Direction-depth-direction" technique used to introduce the needle under continuous pulsed fluoroscopy. Once target was reached, antero-posterior, oblique, and lateral fluoroscopic projection used confirm needle placement in all planes. Images permanently stored in EMR. Interpretation: No contrast injected. I personally interpreted the imaging intraoperatively. Adequate needle placement confirmed in multiple planes. Permanent images saved into the patient's record.  Antibiotic Prophylaxis:   Anti-infectives (From admission, onward)   None     Indication(s): None identified  Post-operative Assessment:  Post-procedure Vital Signs:  Pulse/HCG Rate: 6260 Temp: (!) 97.3 F (36.3 C) Resp: 15 BP: 127/72 SpO2: 100 %  EBL: None  Complications: No immediate post-treatment complications observed by team, or reported by patient.  Note: The patient tolerated the entire procedure well. A repeat set of vitals were taken after the procedure and the patient was kept under observation following institutional policy, for this type of procedure. Post-procedural neurological assessment was performed, showing return to baseline, prior to discharge. The patient was provided with post-procedure discharge instructions, including a section on how to identify potential problems. Should any problems arise concerning this procedure, the patient was given instructions to immediately contact us, at any time, without hesitation. In any case, we plan to contact the patient by telephone for a follow-up status report regarding this interventional procedure.  Comments:  No additional relevant information.  Plan of Care    Imaging Orders     DG C-Arm 1-60 Min-No Report  Procedure Orders     LUMBAR FACET(MEDIAL BRANCH NERVE BLOCK) MBNB  Medications ordered for procedure: Meds ordered this encounter  Medications  . lidocaine (XYLOCAINE) 2 % (with pres) injection 400 mg  . midazolam (VERSED) 5 MG/5ML  injection 1-2 mg    Make sure Flumazenil is available in the pyxis when using this medication. If oversedation  occurs, administer 0.2 mg IV over 15 sec. If after 45 sec no response, administer 0.2 mg again over 1 min; may repeat at 1 min intervals; not to exceed 4 doses (1 mg)  . fentaNYL (SUBLIMAZE) injection 25-50 mcg    Make sure Narcan is available in the pyxis when using this medication. In the event of respiratory depression (RR< 8/min): Titrate NARCAN (naloxone) in increments of 0.1 to 0.2 mg IV at 2-3 minute intervals, until desired degree of reversal.  . lactated ringers infusion 1,000 mL  . ropivacaine (PF) 2 mg/mL (0.2%) (NAROPIN) injection 18 mL  . triamcinolone acetonide (KENALOG-40) injection 80 mg   Medications administered: We administered lidocaine, midazolam, fentaNYL, lactated ringers, ropivacaine (PF) 2 mg/mL (0.2%), and triamcinolone acetonide.  See the medical record for exact dosing, route, and time of administration.  Disposition: Discharge home  Discharge Date & Time: 07/23/2018; 1123 hrs.   Physician-requested Follow-up: Return for post-procedure eval (2 wks), w/ Dr. Dossie Arbour.  Future Appointments  Date Time Provider [place]  07/30/2018  9:40 AM Minna Merritts, MD [id number]  08/19/2018  1:45 PM Milinda Pointer, MD [facility] None  09/24/2018 10:30 AM Vevelyn Francois, NP [facility] None   Primary Care Physician: Elby Beck, FNP Location: [facility] Facility Note by: Gaspar Cola, MD Date: 07/23/2018; Time: 1:37 PM  Disclaimer:  Medicine is not an Chief Strategy Officer. The only guarantee in medicine is that nothing is guaranteed. It is important to note that the decision to proceed with this intervention was based on the information collected from the patient. The Data and conclusions were drawn from the patient's questionnaire, the interview, and the physical examination. Because the information was provided in  large part by the patient, it cannot be guaranteed that it has not been purposely or unconsciously manipulated. Every effort has been made to obtain as much relevant data as possible for this evaluation. It is important to note that the conclusions that lead to this procedure are derived in large part from the available data. Always take into account that the treatment will also be dependent on availability of resources and existing treatment guidelines, considered by other Pain Management Practitioners as being common knowledge and practice, at the time of the intervention. For Medico-Legal purposes, it is also important to point out that variation in procedural techniques and pharmacological choices are the acceptable norm. The indications, contraindications, technique, and results of the above procedure should only be interpreted and judged by a Board-Certified Interventional Pain Specialist with extensive familiarity and expertise in the same exact procedure and technique.

## 2018-07-23 NOTE — Addendum Note (Signed)
Addended by: Milinda Pointer A on: 07/23/2018 01:38 PM   Modules accepted: Orders

## 2018-07-24 ENCOUNTER — Telehealth: Payer: Self-pay | Admitting: *Deleted

## 2018-07-24 NOTE — Telephone Encounter (Signed)
Attempted to call for post procedure follow-up. Message left. 

## 2018-07-29 NOTE — Progress Notes (Signed)
Cardiology Office Note  Date:  07/30/2018   ID:  Teresa Mcgee, DOB 08/31/1965, MRN [id number]  PCP:  Elby Beck, FNP   Chief Complaint  Patient presents with  . New Patient (Initial Visit)    muscle spasms ER recommended.Denies and SOB, denies CP. Medications reviewed verbally.    HPI:  Teresa Mcgee is a 52 y.o. female with a past medical history of Morbid obesity Smoker, 2ppd down to 3 cigs a day Hyperlipidemia, statin myalgias GERD Essential hypertension Sleep apnea hiatal hernia, Chronic back pain  Bell's palsy  Referred by Clarene Reamer for consultation of her abdominal and chest pain  Seen in the emergency room May 25, 2018 with abdominal and chest pain,  Reports it seemed to radiate from her back around her flank to the front lower ribs underneath her breasts bilaterally worse on the left  started after she was cleaning some cabinets with her arms overhead.   She says the pain is worsening with movement and is sharp and severe worse with movement  In the emergency room cardiac enzymes negative, EKG unchanged CT scan chest with no acute findings  It was felt by emergency room physician that her symptoms were consistent with musculoskeletal pain.  She does not feel it was her heart, feels it was her back which she reports is a chronic issue She has seen chiropractic before, also reports having injections to her back Feels fine currently does not want additional work-up Active at baseline with no symptoms of shortness of breath or chest discomfort  EKG personally reviewed by myself on todays visit Shows normal sinus rhythm with rate 59 bpm no significant ST or T wave changes  PMH:   has a past medical history of Arthritis, Bell's palsy (may 2012), Chest pain, GERD (gastroesophageal reflux disease), H/O hiatal hernia, Hyperlipidemia, Hypertension, Hypothyroidism, Shingles, Shortness of breath, and Sleep apnea.  PSH:    Past Surgical History:   Procedure Laterality Date  . CESAREAN SECTION    . cesarian     3x  . PARTIAL HYSTERECTOMY  2000   abnormal uterine bleeding  . THYROIDECTOMY  1998  . TOOTH EXTRACTION    . TUBAL LIGATION      Current Outpatient Medications  Medication Sig Dispense Refill  . albuterol (PROVENTIL HFA;VENTOLIN HFA) 108 (90 Base) MCG/ACT inhaler Inhale 2 puffs into the lungs every 4 (four) hours as needed for wheezing or shortness of breath (cough, shortness of breath or wheezing.). 1 Inhaler 1  . amLODipine (NORVASC) 10 MG tablet Take 1 tablet (10 mg total) by mouth daily. 90 tablet 3  . amoxicillin (AMOXIL) 875 MG tablet Take 1 tablet (875 mg total) by mouth 2 (two) times daily. 14 tablet 0  . Calcium Carbonate-Vit D-Min (GNP CALCIUM 1200) 1200-1000 MG-UNIT CHEW Chew 1,200 mg by mouth daily with breakfast. Take in combination with vitamin D and magnesium. 30 tablet 5  . carboxymethylcellulose 1 % ophthalmic solution Apply 1 drop to eye 3 (three) times daily. 30 mL 12  . cetirizine (ZYRTEC) 10 MG tablet Take 1 tablet (10 mg total) by mouth daily. 90 tablet 3  . [employer] D3 125 MCG (5000 UT) capsule TAKE 1 CAPSULE BY MOUTH DAILY WITH BREAKFAST. TAKE ALONG WITH CALCIUM AND MAGNESIUM.  5  . fluticasone (FLONASE) 50 MCG/ACT nasal spray Place 2 sprays into both nostrils daily. 16 g 6  . gabapentin (NEURONTIN) 100 MG capsule Take 1-3 capsules (100-300 mg total) by mouth 4 (four) times daily.  Follow written titration schedule. 360 capsule 0  . hydrochlorothiazide (HYDRODIURIL) 25 MG tablet Take 1 tablet (25 mg total) by mouth daily. as directed 90 tablet 3  . levothyroxine (SYNTHROID, LEVOTHROID) 112 MCG tablet Take 1 tablet (112 mcg total) by mouth daily. 90 tablet 1  . Magnesium 500 MG CAPS Take 1 capsule (500 mg total) by mouth 2 (two) times daily at 8 am and 10 pm. 60 capsule 5  . meloxicam (MOBIC) 15 MG tablet Take 1 tablet (15 mg total) by mouth daily. 30 tablet 0  . methocarbamol (ROBAXIN) 750 MG tablet Take 1  tablet (750 mg total) by mouth every 8 (eight) hours as needed for muscle spasms. 90 tablet 0  . REFRESH CELLUVISC 1 % GEL APPLY 1 DROP TO EYE 3 (THREE) TIMES DAILY.  12   No current facility-administered medications for this visit.      Allergies:   Patient has no known allergies.   Social History:  The patient  reports that she has been smoking. She has smoked for the past 20.00 years. She has never used smokeless tobacco. She reports that she does not drink alcohol or use drugs.   Family History:   family history includes Diabetes in her brother and sister; Heart attack (age of onset: 94) in her brother; Heart attack (age of onset: 55) in her sister; Heart attack (age of onset: 57) in her father; Stroke (age of onset: 57) in her brother; Stroke (age of onset: 36) in her sister.    Review of Systems: Review of Systems  Constitutional: Negative.   Respiratory: Negative.   Cardiovascular: Negative.   Gastrointestinal: Negative.   Musculoskeletal: Positive for back pain.  Neurological: Negative.   Psychiatric/Behavioral: Negative.   All other systems reviewed and are negative.    PHYSICAL EXAM: VS:  BP 122/72 (BP Location: Right Arm, Patient Position: Sitting, Cuff Size: Large)   Pulse (!) 57   Ht 5\' 3"  (1.6 m)   Wt 267 lb (121.1 kg)   BMI 47.30 kg/m  , BMI Body mass index is 47.3 kg/m. GEN: Well nourished, well developed, in no acute distress  HEENT: normal  Neck: no JVD, carotid bruits, or masses Cardiac: RRR; no murmurs, rubs, or gallops,no edema  Respiratory:  clear to auscultation bilaterally, normal work of breathing GI: soft, nontender, nondistended, + BS MS: no deformity or atrophy  Skin: warm and dry, no rash Neuro:  Strength and sensation are intact Psych: euthymic mood, full affect   Recent Labs: 01/24/2018: Magnesium 2.0 05/25/2018: BUN 12; Creatinine, Ser 0.99; Hemoglobin 14.5; Platelets 238; Potassium 3.7; Sodium 140 05/29/2018: ALT 44; TSH 18.20     Lipid Panel Lab Results  Component Value Date   CHOL 228 (H) 11/16/2017   HDL 44 (L) 11/16/2017   LDLCALC 159 (H) 11/16/2017   TRIG 130 11/16/2017      Wt Readings from Last 3 Encounters:  07/30/18 267 lb (121.1 kg)  07/23/18 267 lb (121.1 kg)  07/01/18 267 lb (121.1 kg)     ASSESSMENT AND PLAN:  Chest pain, unspecified type - Plan: EKG 12-Lead Atypical in nature, most likely rib or musculoskeletal No symptoms on exertion She reports long history of back issues Per her details and emergency room notes she had pain on movement of her arms and twisting of the chest would bring on symptoms These have since resolved -Recommend if symptoms come back she consider chiropractic care  Chronic pain syndrome Reports that she used to be on  pain medication Now goes to the pain clinic for her back pain Has received cortisone shots in the past  HYPERCHOLESTEROLEMIA Reports statin intolerance Recommend she start Zetia 10 mg daily  TOBACCO ABUSE 2 packs down to 3 cigarettes/day Stressed importance of cessation.  She is almost there  HYPERTENSION, BENIGN ESSENTIAL Blood pressure well controlled on today's visit  Obstructive sleep apnea syndrome Managed by primary care  Coronary calcification Seen on CT scan chest, no significant aortic atherosclerosis Some calcification noted in the mid LAD no significant motion artifact Stressed the importance of smoking cessation and aggressive lipid management  Disposition:   F/U as needed  Patient was seen in consultation for Clarene Reamer and will be referred back to her office for ongoing care of the issues detailed above   Total encounter time more than 60 minutes  Greater than 50% was spent in counseling and coordination of care with the patient    Orders Placed This Encounter  Procedures  . EKG 12-Lead     Signed, Esmond Plants, M.D., Ph.D. 07/30/2018  [place], [place]

## 2018-07-30 ENCOUNTER — Ambulatory Visit (INDEPENDENT_AMBULATORY_CARE_PROVIDER_SITE_OTHER): Payer: Medicare HMO | Admitting: Cardiovascular Disease

## 2018-07-30 ENCOUNTER — Encounter: Payer: Self-pay | Admitting: Cardiovascular Disease

## 2018-07-30 VITALS — BP 122/72 | HR 57 | Ht 63.0 in | Wt 267.0 lb

## 2018-07-30 DIAGNOSIS — F17200 Nicotine dependence, unspecified, uncomplicated: Secondary | ICD-10-CM

## 2018-07-30 DIAGNOSIS — G4733 Obstructive sleep apnea (adult) (pediatric): Secondary | ICD-10-CM

## 2018-07-30 DIAGNOSIS — E7800 Pure hypercholesterolemia, unspecified: Secondary | ICD-10-CM | POA: Diagnosis not present

## 2018-07-30 DIAGNOSIS — R079 Chest pain, unspecified: Secondary | ICD-10-CM | POA: Diagnosis not present

## 2018-07-30 DIAGNOSIS — G894 Chronic pain syndrome: Secondary | ICD-10-CM | POA: Diagnosis not present

## 2018-07-30 DIAGNOSIS — I10 Essential (primary) hypertension: Secondary | ICD-10-CM

## 2018-07-30 MED ORDER — EZETIMIBE 10 MG PO TABS: 10.0000 mg | ORAL_TABLET | Freq: Every day | ORAL | 6 refills | Status: DC

## 2018-07-30 NOTE — Patient Instructions (Addendum)
Medication Instructions:   Please start zetia one a day for cholesterol  If you need a refill on your cardiac medications before your next appointment, please call your pharmacy.    Lab work: No new labs needed   If you have labs (blood work) drawn today and your tests are completely normal, you will receive your results only by: Marland Kitchen MyChart Message (if you have MyChart) OR . A paper copy in the mail If you have any lab test that is abnormal or we need to change your treatment, we will call you to review the results.   Testing/Procedures: No new testing needed   Follow-Up: At [facility], you and your health needs are our priority.  As part of our continuing mission to provide you with exceptional heart care, we have created designated Provider Care Teams.  These Care Teams include your primary Cardiologist (physician) and Advanced Practice Providers (APPs -  Physician Assistants and Nurse Practitioners) who all work together to provide you with the care you need, when you need it.  . You will need a follow up appointment as needed  . Providers on your designated Care Team:   . Murray Hodgkins, NP . Christell Faith, PA-C . Marrianne Mood, PA-C  Any Other Special Instructions Will Be Listed Below (If Applicable).  For educational health videos Log in to : [email or web address] Or : [email or web address], password : [email or web address]

## 2018-08-05 ENCOUNTER — Telehealth: Payer: Self-pay | Admitting: *Deleted

## 2018-08-08 NOTE — Telephone Encounter (Signed)
Notified patient that she would need to wait until her appointment on 08-19-18 for refill.

## 2018-08-18 NOTE — Progress Notes (Signed)
Patient's Name: Teresa Mcgee  MRN: [id number]  Referring Provider: Elby Beck, FNP  DOB: 10-16-1965  PCP: Elby Beck, FNP  DOS: 08/19/2018  Note by: Gaspar Cola, MD  Service setting: Ambulatory outpatient  Specialty: Interventional Pain Management  Location: [facility] (AMB) Pain Management Facility    Patient type: Established   Primary Reason(s) for Visit: Encounter for post-procedure evaluation of chronic illness with mild to moderate exacerbation CC: Back Pain (lower); Neck Pain; and Muscle Pain (abdominal)  HPI  Teresa Mcgee is a 53 y.o. year old, female patient, who comes today for a post-procedure evaluation. She has Hypothyroidism; HYPERCHOLESTEROLEMIA; TOBACCO ABUSE; DEPRESSION; HYPERTENSION, BENIGN ESSENTIAL; GERD; Sleep apnea; Fatigue; Nasal septal perforation; Muscle spasms of neck; Nasal mucositis (ulcerative); Numbness and tingling in both hands; Xerotic eczema; Obesity (BMI 35.0-39.9 without comorbidity); Constipation; Chest pain; Impaired glucose tolerance; Osteopathic Exam Findings (Somatic Dysfunction); Chronic low back pain (Bilateral) (L>R) w/ sciatica (Bilateral); Chronic lower extremity pain ( Secondary  Area of  Pain) (Bilateral) (L>R); Chronic neck pain (Tertiary Area of Pain) (Bilateral) (L>R); Chronic pain syndrome; Long term current use of opiate analgesic; Pharmacologic therapy; Disorder of skeletal system; Problems influencing health status; Chronic upper extremity pain (Fourth Area of Pain) (Bilateral) (L>R); Vitamin D deficiency; Cervicalgia; Chronic musculoskeletal pain; DDD (degenerative disc disease), thoracic; DDD (degenerative disc disease), cervical; Lumbar facet arthropathy (Bilateral); Lumbar facet syndrome (Bilateral); Chronic low back pain (Primary Area of Pain) (Bilateral) (L>R) w/o sciatica; Spondylosis without myelopathy or radiculopathy, cervical region; Spondylosis without myelopathy or radiculopathy, lumbosacral region; DDD (degenerative disc  disease), lumbosacral; Strain of lumbar paraspinal muscle, sequela; Chronic hip pain (Bilateral) (R>L); Chronic sacroiliac joint pain (Left); Neurogenic pain; Osteoarthritis involving multiple joints; and Chronic knee pain (Bilateral) (R>L) on their problem list. Her primarily concern today is the Back Pain (lower); Neck Pain; and Muscle Pain (abdominal)  Pain Assessment: Location: Lower Back Radiating: both legs Onset: More than a month ago Duration: Chronic pain Quality: Heaviness, Nagging Severity: 4 /10 (subjective, self-reported pain score)  Note: Reported level is compatible with observation.                         When using our objective Pain Scale, levels between 6 and 10/10 are said to belong in an emergency room, as it progressively worsens from a 6/10, described as severely limiting, requiring emergency care not usually available at an outpatient pain management facility. At a 6/10 level, communication becomes difficult and requires great effort. Assistance to reach the emergency department may be required. Facial flushing and profuse sweating along with potentially dangerous increases in heart rate and blood pressure will be evident. Effect on ADL:   Timing: Intermittent Modifying factors: lying flat on back BP: 140/80  HR: 83  Teresa Mcgee comes in today for post-procedure evaluation.  Further details on both, my assessment(s), as well as the proposed treatment plan, please see below.  Post-Procedure Assessment  07/23/2018 Procedure: Diagnostic bilateral lumbar facet block #2under fluoroscopic guidance and IV sedation. Pre-procedure pain score:  6/10 Post-procedure pain score: 0/10 (100% relief) Influential Factors: BMI: 47.30 kg/m Intra-procedural challenges: None observed.         Assessment challenges: None detected.              Reported side-effects: None.        Post-procedural adverse reactions or complications: None reported         Sedation: Sedation provided.  When no sedatives are used, the  analgesic levels obtained are directly associated to the effectiveness of the local anesthetics. However, when sedation is provided, the level of analgesia obtained during the initial 1 hour following the intervention, is believed to be the result of a combination of factors. These factors may include, but are not limited to: 1. The effectiveness of the local anesthetics used. 2. The effects of the analgesic(s) and/or anxiolytic(s) used. 3. The degree of discomfort experienced by the patient at the time of the procedure. 4. The patients ability and reliability in recalling and recording the events. 5. The presence and influence of possible secondary gains and/or psychosocial factors. Reported result: Relief experienced during the 1st hour after the procedure: 100 % (Ultra-Short Term Relief)            Interpretative annotation: Clinically appropriate result. Analgesia during this period is likely to be Local Anesthetic and/or IV Sedative (Analgesic/Anxiolytic) related.          Effects of local anesthetic: The analgesic effects attained during this period are directly associated to the localized infiltration of local anesthetics and therefore cary significant diagnostic value as to the etiological location, or anatomical origin, of the pain. Expected duration of relief is directly dependent on the pharmacodynamics of the local anesthetic used. Long-acting (4-6 hours) anesthetics used.  Reported result: Relief during the next 4 to 6 hour after the procedure: 100 % (Short-Term Relief)            Interpretative annotation: Clinically appropriate result. Analgesia during this period is likely to be Local Anesthetic-related.          Long-term benefit: Defined as the period of time past the expected duration of local anesthetics (1 hour for short-acting and 4-6 hours for long-acting). With the possible exception of prolonged sympathetic blockade from the local anesthetics,  benefits during this period are typically attributed to, or associated with, other factors such as analgesic sensory neuropraxia, antiinflammatory effects, or beneficial biochemical changes provided by agents other than the local anesthetics.  Reported result: Extended relief following procedure: 100 %(lasted 4 days) (Long-Term Relief)            Interpretative annotation: Clinically possible results. Good relief. No permanent benefit expected. Inflammation plays a part in the etiology to the pain.          Current benefits: Defined as reported results that persistent at this point in time.   Analgesia: 0 %            Function: Somewhat improved ROM: Somewhat improved Interpretative annotation: Recurrence of symptoms. No permanent benefit expected. Effective diagnostic intervention.          Interpretation: Results would suggest a successful diagnostic intervention.                  Plan:  Please see "Plan of Care" for details.                Laboratory Chemistry  Inflammation Markers (CRP: Acute Phase) (ESR: Chronic Phase) Lab Results  Component Value Date   CRP 5.5 (H) 01/24/2018   ESRSEDRATE 40 01/24/2018                         Rheumatology Markers No results found.  Renal Markers Lab Results  Component Value Date   BUN 12 05/25/2018   CREATININE 0.99 05/25/2018   BCR 8 (L) 01/24/2018   GFRAA >60 05/25/2018   GFRNONAA >60 05/25/2018  Hepatic Markers Lab Results  Component Value Date   AST 40 (H) 05/29/2018   ALT 44 (H) 05/29/2018   ALBUMIN 4.4 05/29/2018                        Neuropathy Markers Lab Results  Component Value Date   VITAMINB12 356 01/24/2018   HGBA1C 7.2 (H) 05/29/2018   HIV NON REACTIVE 03/21/2011                        Hematology Parameters Lab Results  Component Value Date   PLT 238 05/25/2018   HGB 14.5 05/25/2018   HCT 44.0 05/25/2018   DDIMER 0.69 (H) 01/11/2012                        CV Markers Lab  Results  Component Value Date   CKTOTAL 586 (H) 01/11/2012   CKMB 5.2 (H) 01/11/2012   TROPONINI 0.03 ([place]) 05/25/2018                         Note: Lab results reviewed.  Recent Imaging Results   Results for orders placed in visit on 07/23/18  DG C-Arm 1-60 Min-No Report   Narrative Fluoroscopy was utilized by the requesting physician.  No radiographic  interpretation.    Interpretation Report: Fluoroscopy was used during the procedure to assist with needle guidance. The images were interpreted intraoperatively by the requesting physician.  Meds   Current Outpatient Medications:  .  albuterol (PROVENTIL HFA;VENTOLIN HFA) 108 (90 Base) MCG/ACT inhaler, Inhale 2 puffs into the lungs every 4 (four) hours as needed for wheezing or shortness of breath (cough, shortness of breath or wheezing.)., Disp: 1 Inhaler, Rfl: 1 .  amLODipine (NORVASC) 10 MG tablet, Take 1 tablet (10 mg total) by mouth daily., Disp: 90 tablet, Rfl: 3 .  Calcium Carbonate-Vit D-Min (GNP CALCIUM 1200) 1200-1000 MG-UNIT CHEW, Chew 1,200 mg by mouth daily with breakfast. Take in combination with vitamin D and magnesium., Disp: 30 tablet, Rfl: 5 .  carboxymethylcellulose 1 % ophthalmic solution, Apply 1 drop to eye 3 (three) times daily., Disp: 30 mL, Rfl: 12 .  cetirizine (ZYRTEC) 10 MG tablet, Take 1 tablet (10 mg total) by mouth daily., Disp: 90 tablet, Rfl: 3 .  [employer] D3 125 MCG (5000 UT) capsule, TAKE 1 CAPSULE BY MOUTH DAILY WITH BREAKFAST. TAKE ALONG WITH CALCIUM AND MAGNESIUM., Disp: , Rfl: 5 .  ezetimibe (ZETIA) 10 MG tablet, Take 1 tablet (10 mg total) by mouth daily., Disp: 30 tablet, Rfl: 6 .  fluticasone (FLONASE) 50 MCG/ACT nasal spray, Place 2 sprays into both nostrils daily., Disp: 16 g, Rfl: 6 .  hydrochlorothiazide (HYDRODIURIL) 25 MG tablet, Take 1 tablet (25 mg total) by mouth daily. as directed, Disp: 90 tablet, Rfl: 3 .  levothyroxine (SYNTHROID, LEVOTHROID) 112 MCG tablet, Take 1 tablet (112 mcg total)  by mouth daily., Disp: 90 tablet, Rfl: 1 .  Magnesium 500 MG CAPS, Take 1 capsule (500 mg total) by mouth 2 (two) times daily at 8 am and 10 pm., Disp: 60 capsule, Rfl: 5 .  REFRESH CELLUVISC 1 % GEL, APPLY 1 DROP TO EYE 3 (THREE) TIMES DAILY., Disp: , Rfl: 12 .  gabapentin (NEURONTIN) 100 MG capsule, Take 1 capsule (100 mg total) by mouth 4 (four) times daily. Follow written titration schedule., Disp: 120 capsule, Rfl: 0 .  meloxicam (  MOBIC) 15 MG tablet, Take 1 tablet (15 mg total) by mouth daily., Disp: 30 tablet, Rfl: 0 .  oxyCODONE (OXY IR/ROXICODONE) 5 MG immediate release tablet, Take 1 tablet (5 mg total) by mouth every 8 (eight) hours as needed for severe pain. Must last 30 days., Disp: 90 tablet, Rfl: 0 .  tiZANidine (ZANAFLEX) 4 MG tablet, Take 1 tablet (4 mg total) by mouth every 8 (eight) hours as needed for muscle spasms., Disp: 90 tablet, Rfl: 0  ROS  Constitutional: Denies any fever or chills Gastrointestinal: No reported hemesis, hematochezia, vomiting, or acute GI distress Musculoskeletal: Denies any acute onset joint swelling, redness, loss of ROM, or weakness Neurological: No reported episodes of acute onset apraxia, aphasia, dysarthria, agnosia, amnesia, paralysis, loss of coordination, or loss of consciousness  Allergies  Ms. Kulinski has No Known Allergies.  [place]  Drug: Ms. Gieselman  reports no history of drug use. Alcohol:  reports no history of alcohol use. Tobacco:  reports that she has been smoking. She has smoked for the past 20.00 years. She has never used smokeless tobacco. Medical:  has a past medical history of Arthritis, Bell's palsy (may 2012), Chest pain, GERD (gastroesophageal reflux disease), H/O hiatal hernia, Hyperlipidemia, Hypertension, Hypothyroidism, Shingles, Shortness of breath, and Sleep apnea. Surgical: Ms. Sonnen  has a past surgical history that includes Thyroidectomy (1998); cesarian; Partial hysterectomy (2000); Cesarean section; Tubal ligation;  and Tooth extraction. Family: family history includes Diabetes in her brother and sister; Heart attack (age of onset: 69) in her brother; Heart attack (age of onset: 65) in her sister; Heart attack (age of onset: 19) in her father; Stroke (age of onset: 75) in her brother; Stroke (age of onset: 56) in her sister.  Constitutional Exam  General appearance: Well nourished, well developed, and well hydrated. In no apparent acute distress Vitals:   08/19/18 1320  BP: 140/80  Pulse: 83  Resp: 18  Temp: 98.2 F (36.8 C)  TempSrc: Oral  SpO2: 100%  Weight: 267 lb (121.1 kg)  Height: _0  (1.6 m)   BMI Assessment: Estimated body mass index is 47.3 kg/m as calculated from the following:   Height as of this encounter: _1  (1.6 m).   Weight as of this encounter: 267 lb (121.1 kg).  BMI interpretation table: BMI level Category Range association with higher incidence of chronic pain  <18 kg/m2 Underweight   18.5-24.9 kg/m2 Ideal body weight   25-29.9 kg/m2 Overweight Increased incidence by 20%  30-34.9 kg/m2 Obese (Class I) Increased incidence by 68%  35-39.9 kg/m2 Severe obesity (Class II) Increased incidence by 136%  >40 kg/m2 Extreme obesity (Class III) Increased incidence by 254%   Patient's current BMI Ideal Body weight  Body mass index is 47.3 kg/m. Ideal body weight: 52.4 kg (115 lb 8.3 oz) Adjusted ideal body weight: 79.9 kg (176 lb 1.8 oz)   BMI Readings from Last 4 Encounters:  08/19/18 47.30 kg/m  07/30/18 47.30 kg/m  07/23/18 47.30 kg/m  07/01/18 47.30 kg/m   Wt Readings from Last 4 Encounters:  08/19/18 267 lb (121.1 kg)  07/30/18 267 lb (121.1 kg)  07/23/18 267 lb (121.1 kg)  07/01/18 267 lb (121.1 kg)  Psych/Mental status: Alert, oriented x 3 (person, place, & time)       Eyes: PERLA Respiratory: No evidence of acute respiratory distress  Cervical Spine Area Exam  Skin & Axial Inspection: No masses, redness, edema, swelling, or associated skin  lesions Alignment: Symmetrical Functional ROM: Unrestricted ROM  Stability: No instability detected Muscle Tone/Strength: Functionally intact. No obvious neuro-muscular anomalies detected. Sensory (Neurological): Unimpaired Palpation: No palpable anomalies              Upper Extremity (UE) Exam    Side: Right upper extremity  Side: Left upper extremity  Skin & Extremity Inspection: Skin color, temperature, and hair growth are WNL. No peripheral edema or cyanosis. No masses, redness, swelling, asymmetry, or associated skin lesions. No contractures.  Skin & Extremity Inspection: Skin color, temperature, and hair growth are WNL. No peripheral edema or cyanosis. No masses, redness, swelling, asymmetry, or associated skin lesions. No contractures.  Functional ROM: Unrestricted ROM          Functional ROM: Unrestricted ROM          Muscle Tone/Strength: Functionally intact. No obvious neuro-muscular anomalies detected.  Muscle Tone/Strength: Functionally intact. No obvious neuro-muscular anomalies detected.  Sensory (Neurological): Unimpaired          Sensory (Neurological): Unimpaired          Palpation: No palpable anomalies              Palpation: No palpable anomalies              Provocative Test(s):  Phalen's test: deferred Tinel's test: deferred Apley's scratch test (touch opposite shoulder):  Action 1 (Across chest): deferred Action 2 (Overhead): deferred Action 3 (LB reach): deferred   Provocative Test(s):  Phalen's test: deferred Tinel's test: deferred Apley's scratch test (touch opposite shoulder):  Action 1 (Across chest): deferred Action 2 (Overhead): deferred Action 3 (LB reach): deferred    Thoracic Spine Area Exam  Skin & Axial Inspection: No masses, redness, or swelling Alignment: Symmetrical Functional ROM: Unrestricted ROM Stability: No instability detected Muscle Tone/Strength: Functionally intact. No obvious neuro-muscular anomalies detected. Sensory  (Neurological): Unimpaired Muscle strength & Tone: No palpable anomalies  Lumbar Spine Area Exam  Skin & Axial Inspection: No masses, redness, or swelling Alignment: Symmetrical Functional ROM: Unrestricted ROM       Stability: No instability detected Muscle Tone/Strength: Functionally intact. No obvious neuro-muscular anomalies detected. Sensory (Neurological): Unimpaired Palpation: No palpable anomalies       Provocative Tests: Hyperextension/rotation test: deferred today       Lumbar quadrant test (Kemp's test): deferred today       Lateral bending test: deferred today       Patrick's Maneuver: deferred today                   FABER* test: deferred today                   S-I anterior distraction/compression test: deferred today         S-I lateral compression test: deferred today         S-I Thigh-thrust test: deferred today         S-I Gaenslen's test: deferred today         *(Flexion, ABduction and External Rotation)  Gait & Posture Assessment  Ambulation: Unassisted Gait: Relatively normal for age and body habitus Posture: WNL   Lower Extremity Exam    Side: Right lower extremity  Side: Left lower extremity  Stability: No instability observed          Stability: No instability observed          Skin & Extremity Inspection: Skin color, temperature, and hair growth are WNL. No peripheral edema or cyanosis. No masses, redness, swelling, asymmetry, or associated skin  lesions. No contractures.  Skin & Extremity Inspection: Skin color, temperature, and hair growth are WNL. No peripheral edema or cyanosis. No masses, redness, swelling, asymmetry, or associated skin lesions. No contractures.  Functional ROM: Unrestricted ROM                  Functional ROM: Unrestricted ROM                  Muscle Tone/Strength: Functionally intact. No obvious neuro-muscular anomalies detected.  Muscle Tone/Strength: Functionally intact. No obvious neuro-muscular anomalies detected.  Sensory  (Neurological): Unimpaired        Sensory (Neurological): Unimpaired        DTR: Patellar: deferred today Achilles: deferred today Plantar: deferred today  DTR: Patellar: deferred today Achilles: deferred today Plantar: deferred today  Palpation: No palpable anomalies  Palpation: No palpable anomalies   Assessment  Primary Diagnosis & Pertinent Problem List: The primary encounter diagnosis was Chronic low back pain (Primary Area of Pain) (Bilateral) (L>R) w/o sciatica. Diagnoses of Chronic lower extremity pain ( Secondary  Area of  Pain) (Bilateral) (L>R), Chronic neck pain (Tertiary Area of Pain) (Bilateral) (L>R), Lumbar facet syndrome (Bilateral), Lumbar facet arthropathy (Bilateral), Spondylosis without myelopathy or radiculopathy, lumbosacral region, Chronic pain syndrome, Neurogenic pain, Osteoarthritis involving multiple joints, Chronic musculoskeletal pain, and Muscle spasms of neck were also pertinent to this visit.  Status Diagnosis  Persistent Controlled Controlled 1. Chronic low back pain (Primary Area of Pain) (Bilateral) (L>R) w/o sciatica   2. Chronic lower extremity pain ( Secondary  Area of  Pain) (Bilateral) (L>R)   3. Chronic neck pain (Tertiary Area of Pain) (Bilateral) (L>R)   4. Lumbar facet syndrome (Bilateral)   5. Lumbar facet arthropathy (Bilateral)   6. Spondylosis without myelopathy or radiculopathy, lumbosacral region   7. Chronic pain syndrome   8. Neurogenic pain   9. Osteoarthritis involving multiple joints   10. Chronic musculoskeletal pain   11. Muscle spasms of neck     Problems updated and reviewed during this visit: No problems updated. Plan of Care  Pharmacotherapy (Medications Ordered): Meds ordered this encounter  Medications  . oxyCODONE (OXY IR/ROXICODONE) 5 MG immediate release tablet    Sig: Take 1 tablet (5 mg total) by mouth every 8 (eight) hours as needed for severe pain. Must last 30 days.    Dispense:  90 tablet    Refill:  0     [place] STOP ACT - Not applicable. Fill one day early if pharmacy is closed on scheduled refill date. Do not fill until: 08/19/18 . Must last 30 days. To last until: 09/18/18.  Marland Kitchen gabapentin (NEURONTIN) 100 MG capsule    Sig: Take 1 capsule (100 mg total) by mouth 4 (four) times daily. Follow written titration schedule.    Dispense:  120 capsule    Refill:  0    Do not place medication on "Automatic Refill". Fill one day early if pharmacy is closed on scheduled refill date.  . meloxicam (MOBIC) 15 MG tablet    Sig: Take 1 tablet (15 mg total) by mouth daily.    Dispense:  30 tablet    Refill:  0    Do not add this medication to the electronic "Automatic Refill" notification system. Patient may have prescription filled one day early if pharmacy is closed on scheduled refill date.  Marland Kitchen tiZANidine (ZANAFLEX) 4 MG tablet    Sig: Take 1 tablet (4 mg total) by mouth every 8 (eight) hours as  needed for muscle spasms.    Dispense:  90 tablet    Refill:  0    Do not place this medication, or any other prescription from our practice, on "Automatic Refill". Patient may have prescription filled one day early if pharmacy is closed on scheduled refill date.   Medications administered today: Jodelle Red. Dockery had no medications administered during this visit.  Procedure Orders    No procedure(s) ordered today   Lab Orders  No laboratory test(s) ordered today   Imaging Orders  No imaging studies ordered today   Referral Orders  No referral(s) requested today   Interventional management options: Planned, scheduled, and/or pending:   We need to have the patient bring her BMI to or below 35 before we can move onto doing radiofrequency ablation of her lumbar facets. Today we will discontinue the Robaxin and will start a trial of Zanaflex 4 mg p.o. 3 times daily.  She has been experiencing low back pain due to spasms. Today we will also do a trial of oxycodone 5 mg 1 tablet p.o. every 8 hours PRN for  pain. The rest of the medications will remain the same as she has been having problems going further up on the Neurontin secondary to somnolence. Today we had a very long conversation with regards to her weight and how she needs to work on bringing it down.   Considering:   Diagnostic bilateral lumbar facet block Possible bilateral lumbar facet RFA Diagnosticleft-sided sacroiliac joint block Possible left-sided sacroiliac joint RFA Diagnostic bilateral intra-articular hip joint injection Diagnostic bilateral femoral nerve + obturator nerve block Possible bilateral femoral nerve + obturator nerve RFA Diagnostic left-sided LESI Diagnostic bilateral transforaminal ESI Diagnostic left-sided CESI Diagnostic bilateral cervical facet block Possible bilateral cervical facet RFA Diagnostic bilateral IA knee joint injections with local anesthetic and steroid Possible series of 5 bilateral intra-articular Hyalgan knee injections Diagnostic bilateral genicular nerve blocks Possible bilateral genicular nerve RFA Diagnostic trigger point injections   Palliative PRN treatment(s):   Palliative bilateral lumbar facet blockunder fluoroscopic guidance and IV sedation    Provider-requested follow-up: Return in about 4 weeks (around 09/16/2018) for Med-Mgmt, w/ Dr. Dossie Arbour.  Future Appointments  Date Time Provider [place]  08/21/2018 11:00 AM Laverle Hobby, MD [id number] None  09/16/2018 10:30 AM Milinda Pointer, MD [facility] None   Primary Care Physician: Elby Beck, FNP Location: [facility] Note by: Gaspar Cola, MD Date: 08/19/2018; Time: 3:43 PM

## 2018-08-19 ENCOUNTER — Ambulatory Visit: Payer: Medicare HMO | Attending: Pain Medicine | Admitting: Pain Medicine

## 2018-08-19 ENCOUNTER — Other Ambulatory Visit: Payer: Self-pay

## 2018-08-19 ENCOUNTER — Encounter: Payer: Self-pay | Admitting: Pain Medicine

## 2018-08-19 VITALS — BP 140/80 | HR 83 | Temp 98.2°F | Resp 18 | Ht 63.0 in | Wt 267.0 lb

## 2018-08-19 DIAGNOSIS — M62838 Other muscle spasm: Secondary | ICD-10-CM | POA: Diagnosis present

## 2018-08-19 DIAGNOSIS — M150 Primary generalized (osteo)arthritis: Secondary | ICD-10-CM

## 2018-08-19 DIAGNOSIS — M47816 Spondylosis without myelopathy or radiculopathy, lumbar region: Secondary | ICD-10-CM | POA: Diagnosis not present

## 2018-08-19 DIAGNOSIS — M79605 Pain in left leg: Secondary | ICD-10-CM

## 2018-08-19 DIAGNOSIS — G8929 Other chronic pain: Secondary | ICD-10-CM | POA: Diagnosis present

## 2018-08-19 DIAGNOSIS — G894 Chronic pain syndrome: Secondary | ICD-10-CM | POA: Diagnosis not present

## 2018-08-19 DIAGNOSIS — M792 Neuralgia and neuritis, unspecified: Secondary | ICD-10-CM

## 2018-08-19 DIAGNOSIS — M7918 Myalgia, other site: Secondary | ICD-10-CM | POA: Diagnosis present

## 2018-08-19 DIAGNOSIS — M545 Low back pain, unspecified: Secondary | ICD-10-CM

## 2018-08-19 DIAGNOSIS — M79604 Pain in right leg: Secondary | ICD-10-CM | POA: Diagnosis not present

## 2018-08-19 DIAGNOSIS — M47817 Spondylosis without myelopathy or radiculopathy, lumbosacral region: Secondary | ICD-10-CM

## 2018-08-19 DIAGNOSIS — M542 Cervicalgia: Secondary | ICD-10-CM | POA: Diagnosis not present

## 2018-08-19 DIAGNOSIS — M159 Polyosteoarthritis, unspecified: Secondary | ICD-10-CM

## 2018-08-19 MED ORDER — GABAPENTIN 100 MG PO CAPS: 100.0000 mg | ORAL_CAPSULE | Freq: Four times a day (QID) | ORAL | 0 refills | Status: DC

## 2018-08-19 MED ORDER — OXYCODONE HCL 5 MG PO TABS: 5.0000 mg | ORAL_TABLET | Freq: Three times a day (TID) | ORAL | 0 refills | Status: DC | PRN

## 2018-08-19 MED ORDER — TIZANIDINE HCL 4 MG PO TABS: 4.0000 mg | ORAL_TABLET | Freq: Three times a day (TID) | ORAL | 0 refills | Status: DC | PRN

## 2018-08-19 MED ORDER — MELOXICAM 15 MG PO TABS: 15.0000 mg | ORAL_TABLET | Freq: Every day | ORAL | 0 refills | Status: DC

## 2018-08-19 NOTE — Progress Notes (Signed)
Safety precautions to be maintained throughout the outpatient stay will include: orient to surroundings, keep bed in low position, maintain call bell within reach at all times, provide assistance with transfer out of bed and ambulation.  

## 2018-08-19 NOTE — Patient Instructions (Addendum)
____________________________________________________________________________________________  Weight Management Required  URGENT: Your weight has been found to be adversely affecting your health.  Dear Teresa Mcgee:  Your current Body mass index is 47.3 kg/m.Marland Kitchen Estimated body mass index is 47.3 kg/m as calculated from the following:   Height as of this encounter: 5\' 3"  (1.6 m).   Weight as of this encounter: 267 lb (121.1 kg).  Your last four (4) weight and BMI calculations are as follows: Wt Readings from Last 4 Encounters:  08/19/18 267 lb (121.1 kg)  07/30/18 267 lb (121.1 kg)  07/23/18 267 lb (121.1 kg)  07/01/18 267 lb (121.1 kg)   BMI Readings from Last 4 Encounters:  08/19/18 47.30 kg/m  07/30/18 47.30 kg/m  07/23/18 47.30 kg/m  07/01/18 47.30 kg/m    Calculations estimate your ideal body weight to be: Ideal body weight: 52.4 kg (115 lb 8.3 oz) Adjusted ideal body weight: 79.9 kg (176 lb 1.8 oz)  Please use the table below to identify your weight category and associated incidence of chronic pain, secondary to your weight.  BMI interpretation table: BMI level Category Associated incidence of chronic pain  <18 kg/m2 Underweight   18.5-24.9 kg/m2 Ideal body weight   25-29.9 kg/m2 Overweight  20%  30-34.9 kg/m2 Obese (Class I)  68%  35-39.9 kg/m2 Severe obesity (Class II)  136%  >40 kg/m2 Extreme obesity (Class III)  254%   In addition: You will be considered "Morbidly Obese", if your BMI is above 30 and you have one or more of the following conditions that are directly associated with obesity: 1. Type 2 Diabetes (Which in turn can lead to cardiovascular diseases (CVD), stroke, peripheral vascular diseases (PVD), retinopathy, nephropathy, and neuropathy) 2. Cardiovascular Disease  3. Breathing problems (Asthma, obesity-hypoventilation syndrome, obstructive sleep apnea, chronic inflammatory airway disease) 4. Chronic kidney disease 5. Liver disease (nonalcoholic  fatty liver disease) 6. High blood pressure 7. Acid reflux (gastroesophageal reflux disease) 8. Osteoarthritis (OA) 9. Low back pain (Lumbar Facet Syndrome) 10. Hip pain (Osteoarthritis of hip) 11. Knee pain (Osteoarthritis of knee) (patients with a BMI>30 kg/m2 were 6.8 times more likely to develop knee OA than normal-weight individuals) 12. Certain types of cancer. (Epidemiological studies have shown that obesity is a risk factor for: post-menopausal breast cancer; cancers of the endometrium, colon and kidney; malignant adenomas of the oesophagus. Obese subjects have an approximately 1.5-3.5-fold increased risk of developing these cancers compared with normal-weight subjects, and it has been estimated that between 15 and 45% of these cancers can be attributed to overweight. More recent studies suggest that obesity may also increase the risk of other types of cancer, including pancreatic, hepatic and gallbladder cancer. Ref: Obesity and cancer. Pischon T, Nthlings U, [employer] H. Proc Nutr Soc. 2008 May;67(2):128-45. doi: 09.4709/G2836629476546503.)  Recommendation: At this point it is urgent that you take a step back and concentrate in loosing weight. Because most chronic pain patients do have difficulty exercising secondary to their pain, you must rely on proper nutrition and dieting in order to lose the weight. If your BMI is above 40, you should seriously consider bariatric surgery. A realistic goal is to lose 10% of your body weight over a period of 12 months.  If over time you have unsuccessfully try to lose weight, then it is time for you to seek professional help and to enter a medically supervised weight management program.  Pain management considerations:  1. Pharmacological Problems: Be advised that the use of opioid analgesics has been associated with decreased  metabolism and weight gain.  For this reason, should we see that you are unable to lose weight while taking these medications, it may  become necessary for [place] to taper down and indefinitely discontinue these medicines.  2. Technical Problems: The incidence of successful interventional therapies decreases as the patient's BMI increases. It is much more difficult to accomplish a safe and effective interventional therapy on a patient with a BMI above 35. Yours is Body mass index is 47.3 kg/m.Marland Kitchen 3. Radiation Exposure Problems: The x-rays machine, used to accomplish injection therapies, will automatically increase their x-ray output in order to capture an appropriate bone image. This means that radiation exposure increases exponentially with the patient's BMI. (The higher the BMI, the higher the radiation exposure.) Although the level of radiation used at a given time is still safe to the patient, it is not for the physician and/or assisting staff. Unfortunately, radiation exposure is accumulative. Because physicians and the staff have to do procedures and be exposed on a daily basis, this can result in health problems such as cancer and radiation burns. Radiation exposure to the staff is monitored by the radiation batches that they wear. The exposure levels are reported back to the staff on a quarterly basis. Depending on levels of exposure, physicians and staff may be obligated by law to decrease this exposure. This means that they have the right and obligation to refuse providing therapies where they may be overexposed to radiation. For this reason, physicians may decline to offer therapies such as radiofrequency ablation or implants to patients with a BMI above 40. ____________________________________________________________________________________________  Venida Jarvis date 08/19/18 for oxycodone that was escribed to your pharmacy

## 2018-08-20 ENCOUNTER — Encounter: Payer: Self-pay | Admitting: Internal Medicine

## 2018-08-21 ENCOUNTER — Ambulatory Visit (INDEPENDENT_AMBULATORY_CARE_PROVIDER_SITE_OTHER): Payer: Medicare HMO | Admitting: Internal Medicine

## 2018-08-21 ENCOUNTER — Other Ambulatory Visit: Payer: Self-pay | Admitting: Pain Medicine

## 2018-08-21 ENCOUNTER — Encounter: Payer: Self-pay | Admitting: Internal Medicine

## 2018-08-21 VITALS — BP 122/84 | HR 75 | Ht 63.0 in | Wt 266.2 lb

## 2018-08-21 DIAGNOSIS — Z720 Tobacco use: Secondary | ICD-10-CM

## 2018-08-21 DIAGNOSIS — G4733 Obstructive sleep apnea (adult) (pediatric): Secondary | ICD-10-CM

## 2018-08-21 NOTE — Patient Instructions (Signed)
Will change pressure range to 10-20 cm H2O.  Turn down the humidity on your machine so that it does not go through so much water.

## 2018-08-21 NOTE — Addendum Note (Signed)
Addended by: Maryanna Shape A on: 08/21/2018 11:24 AM   Modules accepted: Orders

## 2018-08-21 NOTE — Progress Notes (Signed)
Teresa Mcgee     Assessment and Plan:  Obstructive Sleep apnea.  - Machine at about max of 20 cm.  --Change pressure range of 10-20 cm H2O.   Nicotine Abuse.  - Spent 3 minutes in discussion.  - She is setting a quit date on her sisters birthday in April.     Return in about 1 year (around 08/22/2019).    Date: 08/21/2018  MRN# [id number] Teresa Mcgee 1966/04/13   Teresa Mcgee is a 53 y.o. old female seen in consultation for chief complaint of:    Chief Complaint  Patient presents with  . Follow-up    overall doing well  . Sleep Apnea    uses CPAP every night     HPI:   The patient is a 53 year old female, she presents with snoring, trouble with sleeping, excessive daytime sleepiness.  She usually goes to bed around 1 AM, and often takes her 1 to 1-1/2 hours to fall asleep.  She gets out of bed around 1 AM.  She has a previous diagnosis of obstructive sleep apnea, she was on CPAP since about 2007 but the machine broke about 6 months ago.  She feels well since starting CPAP, she uses it every night for the whole night. She feels that pressure is a bit high when it cuts on. She is more awake during the day, she wakes feeling rested. She no longer sleeps in until noon.   She is smoking about 1/3 ppd. She has tried nic gum. Patches not covered by insurance.   **CPAP download 07/22/2018-08/20/2018>> usage greater than 4 hours is 29/30 days.  Average usage on days used 7 hours 23 minutes.  Pressure ranges 5-20.  Median pressure 16, 95th percentile pressure 19.4, maximum pressure 19.7.  Leaks are within normal limits.  Residual AHI is 3.2.  Overall this shows very good compliance with CPAP with excellent control of obstructive sleep apnea. **Chest Xray 06/28/16>>normal lungs. Images personally reviewed.   Medication:    Current Outpatient Medications:  .  albuterol (PROVENTIL HFA;VENTOLIN HFA) 108 (90 Base) MCG/ACT inhaler, Inhale 2 puffs into the lungs  every 4 (four) hours as needed for wheezing or shortness of breath (cough, shortness of breath or wheezing.)., Disp: 1 Inhaler, Rfl: 1 .  amLODipine (NORVASC) 10 MG tablet, Take 1 tablet (10 mg total) by mouth daily., Disp: 90 tablet, Rfl: 3 .  Calcium Carbonate-Vit D-Min (GNP CALCIUM 1200) 1200-1000 MG-UNIT CHEW, Chew 1,200 mg by mouth daily with breakfast. Take in combination with vitamin D and magnesium., Disp: 30 tablet, Rfl: 5 .  carboxymethylcellulose 1 % ophthalmic solution, Apply 1 drop to eye 3 (three) times daily., Disp: 30 mL, Rfl: 12 .  cetirizine (ZYRTEC) 10 MG tablet, Take 1 tablet (10 mg total) by mouth daily., Disp: 90 tablet, Rfl: 3 .  [employer] D3 125 MCG (5000 UT) capsule, TAKE 1 CAPSULE BY MOUTH DAILY WITH BREAKFAST. TAKE ALONG WITH CALCIUM AND MAGNESIUM., Disp: , Rfl: 5 .  ezetimibe (ZETIA) 10 MG tablet, Take 1 tablet (10 mg total) by mouth daily., Disp: 30 tablet, Rfl: 6 .  fluticasone (FLONASE) 50 MCG/ACT nasal spray, Place 2 sprays into both nostrils daily., Disp: 16 g, Rfl: 6 .  gabapentin (NEURONTIN) 100 MG capsule, Take 1 capsule (100 mg total) by mouth 4 (four) times daily. Follow written titration schedule., Disp: 120 capsule, Rfl: 0 .  hydrochlorothiazide (HYDRODIURIL) 25 MG tablet, Take 1 tablet (25 mg total) by mouth daily. as directed,  Disp: 90 tablet, Rfl: 3 .  levothyroxine (SYNTHROID, LEVOTHROID) 112 MCG tablet, Take 1 tablet (112 mcg total) by mouth daily., Disp: 90 tablet, Rfl: 1 .  Magnesium 500 MG CAPS, Take 1 capsule (500 mg total) by mouth 2 (two) times daily at 8 am and 10 pm., Disp: 60 capsule, Rfl: 5 .  meloxicam (MOBIC) 15 MG tablet, Take 1 tablet (15 mg total) by mouth daily., Disp: 30 tablet, Rfl: 0 .  oxyCODONE (OXY IR/ROXICODONE) 5 MG immediate release tablet, Take 1 tablet (5 mg total) by mouth every 8 (eight) hours as needed for severe pain. Must last 30 days., Disp: 90 tablet, Rfl: 0 .  REFRESH CELLUVISC 1 % GEL, APPLY 1 DROP TO EYE 3 (THREE) TIMES DAILY.,  Disp: , Rfl: 12 .  tiZANidine (ZANAFLEX) 4 MG tablet, Take 1 tablet (4 mg total) by mouth every 8 (eight) hours as needed for muscle spasms., Disp: 90 tablet, Rfl: 0   Allergies:  Patient has no known allergies.  Review of Systems:  Constitutional: Feels well. Cardiovascular: Denies chest pain, exertional chest pain.  Pulmonary: Denies hemoptysis, pleuritic chest pain.   The remainder of systems were reviewed and were found to be negative other than what is documented in the HPI.    Physical Examination:   VS: BP 122/84 (BP Location: Left Arm, Cuff Size: Normal)   Pulse 75   Ht 5\' 3"  (1.6 m)   Wt 266 lb 3.2 oz (120.7 kg)   SpO2 97%   BMI 47.16 kg/m   General Appearance: No distress  Neuro:without focal findings, mental status, speech normal, alert and oriented HEENT: PERRLA, EOM intact Pulmonary: No wheezing, No rales  CardiovascularNormal S1,S2.  No m/r/g.  Abdomen: Benign, Soft, non-tender, No masses Renal:  No costovertebral tenderness  GU:  No performed at this time. Endoc: No evident thyromegaly, no signs of acromegaly or Cushing features Skin:   warm, no rashes, no ecchymosis  Extremities: normal, no cyanosis, clubbing.      LABORATORY PANEL:   CBC No results for input(s): WBC, HGB, HCT, PLT in the last 168 hours. ------------------------------------------------------------------------------------------------------------------  Chemistries  No results for input(s): NA, K, CL, CO2, GLUCOSE, BUN, CREATININE, CALCIUM, MG, AST, ALT, ALKPHOS, BILITOT in the last 168 hours.  Invalid input(s): GFRCGP ------------------------------------------------------------------------------------------------------------------  Cardiac Enzymes No results for input(s): TROPONINI in the last 168 hours. ------------------------------------------------------------  RADIOLOGY:  No results found.     Thank  you for the consultation and for allowing [facility] to assist in the care of your patient. Our recommendations are noted above.  Please contact us if we can be of further service.   Marda Stalker, M.D., F.C.C.P.  Board Certified in Internal [place], Pulmonary [place], [place], and Sleep [place].  [facility] Office Number: [phone]   08/21/2018

## 2018-09-08 ENCOUNTER — Other Ambulatory Visit: Payer: Self-pay | Admitting: Pain Medicine

## 2018-09-08 DIAGNOSIS — M7918 Myalgia, other site: Secondary | ICD-10-CM

## 2018-09-08 DIAGNOSIS — M150 Primary generalized (osteo)arthritis: Secondary | ICD-10-CM

## 2018-09-08 DIAGNOSIS — G8929 Other chronic pain: Secondary | ICD-10-CM

## 2018-09-08 DIAGNOSIS — M62838 Other muscle spasm: Secondary | ICD-10-CM

## 2018-09-08 DIAGNOSIS — M159 Polyosteoarthritis, unspecified: Secondary | ICD-10-CM

## 2018-09-12 NOTE — Progress Notes (Addendum)
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 PRN Procedure.  Future Appointments  Date Time Provider [place]  11/14/2018 11:15 AM Vevelyn Francois, NP [facility] None   Primary Care Physician: Elby Beck, FNP Location: [facility] Facility Note by: Gaspar Cola, MD Date: 09/16/2018; Time: 11:00 AM

## 2018-09-16 ENCOUNTER — Other Ambulatory Visit: Payer: Self-pay

## 2018-09-16 ENCOUNTER — Encounter: Payer: Self-pay | Admitting: Pain Medicine

## 2018-09-16 ENCOUNTER — Telehealth: Payer: Self-pay | Admitting: Pain Medicine

## 2018-09-16 ENCOUNTER — Ambulatory Visit: Payer: Medicare HMO | Attending: Pain Medicine | Admitting: Pain Medicine

## 2018-09-16 VITALS — BP 124/60 | HR 74 | Temp 97.8°F | Resp 18 | Ht 63.0 in | Wt 260.0 lb

## 2018-09-16 DIAGNOSIS — M159 Polyosteoarthritis, unspecified: Secondary | ICD-10-CM

## 2018-09-16 DIAGNOSIS — M542 Cervicalgia: Secondary | ICD-10-CM | POA: Diagnosis present

## 2018-09-16 DIAGNOSIS — M792 Neuralgia and neuritis, unspecified: Secondary | ICD-10-CM

## 2018-09-16 DIAGNOSIS — M62838 Other muscle spasm: Secondary | ICD-10-CM | POA: Diagnosis present

## 2018-09-16 DIAGNOSIS — G8929 Other chronic pain: Secondary | ICD-10-CM | POA: Insufficient documentation

## 2018-09-16 DIAGNOSIS — M79605 Pain in left leg: Secondary | ICD-10-CM | POA: Diagnosis present

## 2018-09-16 DIAGNOSIS — M545 Low back pain: Secondary | ICD-10-CM | POA: Diagnosis not present

## 2018-09-16 DIAGNOSIS — M79601 Pain in right arm: Secondary | ICD-10-CM

## 2018-09-16 DIAGNOSIS — Z79899 Other long term (current) drug therapy: Secondary | ICD-10-CM

## 2018-09-16 DIAGNOSIS — M79602 Pain in left arm: Secondary | ICD-10-CM | POA: Diagnosis present

## 2018-09-16 DIAGNOSIS — M7918 Myalgia, other site: Secondary | ICD-10-CM | POA: Diagnosis present

## 2018-09-16 DIAGNOSIS — Z6841 Body Mass Index (BMI) 40.0 and over, adult: Secondary | ICD-10-CM | POA: Diagnosis present

## 2018-09-16 DIAGNOSIS — M79604 Pain in right leg: Secondary | ICD-10-CM | POA: Insufficient documentation

## 2018-09-16 DIAGNOSIS — M150 Primary generalized (osteo)arthritis: Secondary | ICD-10-CM | POA: Insufficient documentation

## 2018-09-16 DIAGNOSIS — G894 Chronic pain syndrome: Secondary | ICD-10-CM | POA: Diagnosis present

## 2018-09-16 MED ORDER — TIZANIDINE HCL 4 MG PO TABS: 4.0000 mg | ORAL_TABLET | Freq: Three times a day (TID) | ORAL | 2 refills | Status: DC | PRN

## 2018-09-16 MED ORDER — GABAPENTIN 100 MG PO CAPS: 100.0000 mg | ORAL_CAPSULE | Freq: Four times a day (QID) | ORAL | 2 refills | Status: DC

## 2018-09-16 MED ORDER — OXYCODONE HCL 5 MG PO TABS: 5.0000 mg | ORAL_TABLET | Freq: Three times a day (TID) | ORAL | 0 refills | Status: DC | PRN

## 2018-09-16 MED ORDER — MELOXICAM 15 MG PO TABS: 15.0000 mg | ORAL_TABLET | Freq: Every day | ORAL | 2 refills | Status: DC

## 2018-09-16 NOTE — Progress Notes (Signed)
Nursing Pain Medication Assessment:  Safety precautions to be maintained throughout the outpatient stay will include: orient to surroundings, keep bed in low position, maintain call bell within reach at all times, provide assistance with transfer out of bed and ambulation.  Medication Inspection Compliance: Pill count conducted under aseptic conditions, in front of the patient. Neither the pills nor the bottle was removed from the patient's sight at any time. Once count was completed pills were immediately returned to the patient in their original bottle.  Medication: Oxycodone IR Pill/Patch Count: 4 of 90 pills remain Pill/Patch Appearance: Markings consistent with prescribed medication Bottle Appearance: Standard pharmacy container. Clearly labeled. Filled Date: 01 / 06 / 2020 Last Medication intake:  Yesterday

## 2018-09-16 NOTE — Telephone Encounter (Signed)
It looks like the patient is correct, only one month of Oxycodone was presribed. Orders are to return in 2 months.

## 2018-09-16 NOTE — Patient Instructions (Addendum)
____________________________________________________________________________________________  Weight Management Required  URGENT: Your weight has been found to be adversely affecting your health.  Dear Teresa Mcgee:  Your current Body mass index is 46.06 kg/m.Marland Kitchen Estimated body mass index is 46.06 kg/m as calculated from the following:   Height as of this encounter: 5\' 3"  (1.6 m).   Weight as of this encounter: 260 lb (117.9 kg).  Your last four (4) weight and BMI calculations are as follows: Wt Readings from Last 4 Encounters:  09/16/18 260 lb (117.9 kg)  08/21/18 266 lb 3.2 oz (120.7 kg)  08/19/18 267 lb (121.1 kg)  07/30/18 267 lb (121.1 kg)   BMI Readings from Last 4 Encounters:  09/16/18 46.06 kg/m  08/21/18 47.16 kg/m  08/19/18 47.30 kg/m  07/30/18 47.30 kg/m    Calculations estimate your ideal body weight to be: Ideal body weight: 52.4 kg (115 lb 8.3 oz) Adjusted ideal body weight: 78.6 kg (173 lb 5 oz)  Please use the table below to identify your weight category and associated incidence of chronic pain, secondary to your weight.  BMI interpretation table: BMI level Category Associated incidence of chronic pain  <18 kg/m2 Underweight   18.5-24.9 kg/m2 Ideal body weight   25-29.9 kg/m2 Overweight  20%  30-34.9 kg/m2 Obese (Class I)  68%  35-39.9 kg/m2 Severe obesity (Class II)  136%  >40 kg/m2 Extreme obesity (Class III)  254%   In addition: You will be considered "Morbidly Obese", if your BMI is above 30 and you have one or more of the following conditions which are known to be directly associated with obesity: 1. Type 2 Diabetes (Which in turn can lead to cardiovascular diseases (CVD), stroke, peripheral vascular diseases (PVD), retinopathy, nephropathy, and neuropathy) 2. Cardiovascular Disease (High Blood Pressure; Congestive Heart Failure; High Cholesterol; Coronary Artery Disease; Angina; or History of Heart Attacks) 3. Breathing problems (Asthma;  obesity-hypoventilation syndrome; obstructive sleep apnea; chronic inflammatory airway disease; reactive airway disease; or shortness of breath) 4. Chronic kidney disease 5. Liver disease (nonalcoholic fatty liver disease) 6. High blood pressure 7. Acid reflux (gastroesophageal reflux disease; heartburn) 8. Osteoarthritis (OA) (with any of the following: hip pain; knee pain; and/or low back pain) 9. Low back pain (Lumbar Facet Syndrome; and/or Degenerative Disc Disease) 10. Hip pain (Osteoarthritis of hip) (For every 1 lbs of added body weight, there is a 2 lbs increase in pressure inside of each hip articulation. 1:2 mechanical relationship) 11. Knee pain (Osteoarthritis of knee) (For every 1 lbs of added body weight, there is a 4 lbs increase in pressure inside of each knee articulation. 1:4 mechanical relationship) (patients with a BMI>30 kg/m2 were 6.8 times more likely to develop knee OA than normal-weight individuals) 12. Certain types of cancer. (Epidemiological studies have shown that obesity is a risk factor for: post-menopausal breast cancer; cancers of the endometrium, colon and kidney cancer; malignant adenomas of the oesophagus. Obese subjects have an approximately 1.5-3.5-fold increased risk of developing these cancers compared with normal-weight subjects, and it has been estimated that between 15 and 45% of these cancers can be attributed to overweight. More recent studies suggest that obesity may also increase the risk of other types of cancer, including pancreatic, hepatic and gallbladder cancer. Ref: Obesity and cancer. Pischon T, Nthlings U, [employer] H. Proc Nutr Soc. 2008 May;67(2):128-45. doi: 78.2956/O1308657846962952.)  Recommendation: At this point it is urgent that you take a step back and concentrate in loosing weight. Dedicate 100% of your efforts on this task. Nothing else will improve  your health more than bringing down your BMI to less than 30. Because most chronic pain  patients do have difficulty exercising secondary to their pain, you must rely on proper nutrition and dieting in order to lose the weight. If your BMI is above 40, you should seriously consider bariatric surgery. A realistic goal is to lose 10% of your body weight over a period of 12 months.  If over time you have unsuccessfully try to lose weight, then it is time for you to seek professional help and to enter a medically supervised weight management program.  Pain management considerations:  1. Pharmacological Problems: Be advised that the use of opioid analgesics (oxycodone; hydrocodone; morphine; methadone; codeine; and all of their derivatives) have been associated with decreased metabolism and weight gain.  For this reason, should we see that you are unable to lose weight while taking these medications, it may become necessary for [place] to taper down and indefinitely discontinue them.  2. Technical Problems: The incidence of successful interventional therapies decreases as the patient's BMI increases. It is much more difficult to accomplish a safe and effective interventional therapy on a patient with a BMI above 35. Yours is Body mass index is 46.06 kg/m.Marland Kitchen  3. Radiation Exposure Problems: The x-rays machine, used to accomplish injection therapies, will automatically increase their x-ray output in order to capture an appropriate bone image. This means that radiation exposure increases exponentially with the patient's BMI. (The higher the BMI, the higher the radiation exposure.) Although the level of radiation used at a given time is still safe to the patient, it is not for the physician and/or assisting staff. Unfortunately, radiation exposure is accumulative. Because physicians and the staff have to do procedures and be exposed on a daily basis, this can result in health problems such as cancer and radiation burns. Radiation exposure to the staff is monitored by the radiation batches that they wear. The  exposure levels are reported back to the staff on a quarterly basis. Depending on levels of exposure, physicians and staff may be obligated by law to decrease this exposure. This means that they have the right and obligation to refuse providing therapies where they may be overexposed to radiation. For this reason, physicians may decline to offer therapies such as radiofrequency ablation or implants to patients with a BMI above 40. 4. Current Trends: Be advised that the current trend is to no longer offer certain therapies to patients with a BMI equal to, or above 35, due to increase perioperative risks, increased technical procedural difficulties, and excessive radiation exposure to healthcare personnel. ____________________________________________________________________________________________  ____________________________________________________________________________________________  Medication Rules  Purpose: To inform patients, and their family members, of our rules and regulations.  Applies to: All patients receiving prescriptions (written or electronic).  Pharmacy of record: Pharmacy where electronic prescriptions will be sent. If written prescriptions are taken to a different pharmacy, please inform the nursing staff. The pharmacy listed in the electronic medical record should be the one where you would like electronic prescriptions to be sent.  Electronic prescriptions: In compliance with the [place] (STOP) Act of 2017 (Session Lanny Cramp [phone]), effective August 14, 2018, all controlled substances must be electronically prescribed. Calling prescriptions to the pharmacy will cease to exist.  Prescription refills: Only during scheduled appointments. Applies to all prescriptions.  NOTE: The following applies primarily to controlled substances (Opioid* Pain Medications).   Patient's responsibilities: 1. Pain Pills: Bring all pain pills to every  appointment (except for  procedure appointments). 2. Pill Bottles: Bring pills in original pharmacy bottle. Always bring the newest bottle. Bring bottle, even if empty. 3. Medication refills: You are responsible for knowing and keeping track of what medications you take and those you need refilled. The day before your appointment: write a list of all prescriptions that need to be refilled. The day of the appointment: give the list to the admitting nurse. Prescriptions will be written only during appointments. If you forget a medication: it will not be "Called in", "Faxed", or "electronically sent". You will need to get another appointment to get these prescribed. No early refills. Do not call asking to have your prescription filled early. 4. Prescription Accuracy: You are responsible for carefully inspecting your prescriptions before leaving our office. Have the discharge nurse carefully go over each prescription with you, before taking them home. Make sure that your name is accurately spelled, that your address is correct. Check the name and dose of your medication to make sure it is accurate. Check the number of pills, and the written instructions to make sure they are clear and accurate. Make sure that you are given enough medication to last until your next medication refill appointment. 5. Taking Medication: Take medication as prescribed. When it comes to controlled substances, taking less pills or less frequently than prescribed is permitted and encouraged. Never take more pills than instructed. Never take medication more frequently than prescribed.  6. Inform other Doctors: Always inform, all of your healthcare providers, of all the medications you take. 7. Pain Medication from other Providers: You are not allowed to accept any additional pain medication from any other Doctor or Healthcare provider. There are two exceptions to this rule. (see below) In the event that you require additional pain  medication, you are responsible for notifying us, as stated below. 8. Medication Agreement: You are responsible for carefully reading and following our Medication Agreement. This must be signed before receiving any prescriptions from our practice. Safely store a copy of your signed Agreement. Violations to the Agreement will result in no further prescriptions. (Additional copies of our Medication Agreement are available upon request.) 9. Laws, Rules, & Regulations: All patients are expected to follow all Federal and [employer], [employer], Rules, [employer]. Ignorance of the Laws does not constitute a valid excuse. The use of any illegal substances is prohibited. 10. Adopted CDC guidelines & recommendations: Target dosing levels will be at or below 60 MME/day. Use of benzodiazepines** is not recommended.  Exceptions: There are only two exceptions to the rule of not receiving pain medications from other Healthcare Providers. 1. Exception #1 (Emergencies): In the event of an emergency (i.e.: accident requiring emergency care), you are allowed to receive additional pain medication. However, you are responsible for: As soon as you are able, call our office [phone], at any time of the day or night, and leave a message stating your name, the date and nature of the emergency, and the name and dose of the medication prescribed. In the event that your call is answered by a member of our staff, make sure to document and save the date, time, and the name of the person that took your information.  2. Exception #2 (Planned Surgery): In the event that you are scheduled by another doctor or dentist to have any type of surgery or procedure, you are allowed (for a period no longer than 30 days), to receive additional pain medication, for the acute post-op pain. However, in this case, you  are responsible for picking up a copy of our "Post-op Pain Management for Surgeons" handout, and giving it to your surgeon or  dentist. This document is available at our office, and does not require an appointment to obtain it. Simply go to our office during business hours (Monday-Thursday from 8:00 AM to 4:00 PM) (Friday 8:00 AM to 12:00 Noon) or if you have a scheduled appointment with [place], prior to your surgery, and ask for it by name. In addition, you will need to provide [place] with your name, name of your surgeon, type of surgery, and date of procedure or surgery.  *Opioid medications include: morphine, codeine, oxycodone, oxymorphone, hydrocodone, hydromorphone, meperidine, tramadol, tapentadol, buprenorphine, fentanyl, methadone. **Benzodiazepine medications include: diazepam (Valium), alprazolam (Xanax), clonazepam (Klonopine), lorazepam (Ativan), clorazepate (Tranxene), chlordiazepoxide (Librium), estazolam (Prosom), oxazepam (Serax), temazepam (Restoril), triazolam (Halcion) (Last updated: 10/11/2017) ____________________________________________________________________________________________   ____________________________________________________________________________________________  Medication Recommendations and Reminders  Applies to: All patients receiving prescriptions (written and/or electronic).  Medication Rules & Regulations: These rules and regulations exist for your safety and that of others. They are not flexible and neither are we. Dismissing or ignoring them will be considered "non-compliance" with medication therapy, resulting in complete and irreversible termination of such therapy. (See document titled "Medication Rules" for more details.) In all conscience, because of safety reasons, we cannot continue providing a therapy where the patient does not follow instructions.  Pharmacy of record:   Definition: This is the pharmacy where your electronic prescriptions will be sent.   We do not endorse any particular pharmacy.  You are not restricted in your choice of pharmacy.  The pharmacy listed in  the electronic medical record should be the one where you want electronic prescriptions to be sent.  If you choose to change pharmacy, simply notify our nursing staff of your choice of new pharmacy.  Recommendations:  Keep all of your pain medications in a safe place, under lock and key, even if you live alone.   After you fill your prescription, take 1 week's worth of pills and put them away in a safe place. You should keep a separate, properly labeled bottle for this purpose. The remainder should be kept in the original bottle. Use this as your primary supply, until it runs out. Once it's gone, then you know that you have 1 week's worth of medicine, and it is time to come in for a prescription refill. If you do this correctly, it is unlikely that you will ever run out of medicine.  To make sure that the above recommendation works, it is very important that you make sure your medication refill appointments are scheduled at least 1 week before you run out of medicine. To do this in an effective manner, make sure that you do not leave the office without scheduling your next medication management appointment. Always ask the nursing staff to show you in your prescription , when your medication will be running out. Then arrange for the receptionist to get you a return appointment, at least 7 days before you run out of medicine. Do not wait until you have 1 or 2 pills left, to come in. This is very poor planning and does not take into consideration that we may need to cancel appointments due to bad weather, sickness, or emergencies affecting our staff.  "Partial Fill": If for any reason your pharmacy does not have enough pills/tablets to completely fill or refill your prescription, do not allow for a "partial fill". You will need a separate  prescription to fill the remaining amount, which we will not provide. If the reason for the partial fill is your insurance, you will need to talk to the pharmacist about  payment alternatives for the remaining tablets, but again, do not accept a partial fill.  Prescription refills and/or changes in medication(s):   Prescription refills, and/or changes in dose or medication, will be conducted only during scheduled medication management appointments. (Applies to both, written and electronic prescriptions.)  No refills on procedure days. No medication will be changed or started on procedure days. No changes, adjustments, and/or refills will be conducted on a procedure day. Doing so will interfere with the diagnostic portion of the procedure.  No phone refills. No medications will be "called into the pharmacy".  No Fax refills.  No weekend refills.  No Holliday refills.  No after hours refills.  Remember:  Business hours are:  Monday to Thursday 8:00 AM to 4:00 PM Provider's Schedule: Dionisio David, NP - Appointments are:  Medication management: Monday to Thursday 8:00 AM to 4:00 PM Milinda Pointer, MD - Appointments are:  Medication management: Monday and Wednesday 8:00 AM to 4:00 PM Procedure day: Tuesday and Thursday 7:30 AM to 4:00 PM Gillis Santa, MD - Appointments are:  Medication management: Tuesday and Thursday 8:00 AM to 4:00 PM Procedure day: Monday and Wednesday 7:30 AM to 4:00 PM (Last update: 10/11/2017) ____________________________________________________________________________________________   ____________________________________________________________________________________________  CANNABIDIOL (AKA: CBD Oil or Pills)  Applies to: All patients receiving prescriptions of controlled substances (written and/or electronic).  General Information: Cannabidiol (CBD) was discovered in 52. It is one of some 113 identified cannabinoids in cannabis (Marijuana) plants, accounting for up to 40% of the plant's extract. As of 2018, preliminary clinical research on cannabidiol included studies of anxiety, cognition, movement disorders, and  pain.  Cannabidiol is consummed in multiple ways, including inhalation of cannabis smoke or vapor, as an aerosol spray into the cheek, and by mouth. It may be supplied as CBD oil containing CBD as the active ingredient (no added tetrahydrocannabinol (THC) or terpenes), a full-plant CBD-dominant hemp extract oil, capsules, dried cannabis, or as a liquid solution. CBD is thought not have the same psychoactivity as THC, and may affect the actions of THC. Studies suggest that CBD may interact with different biological targets, including cannabinoid receptors and other neurotransmitter receptors. As of 2018 the mechanism of action for its biological effects has not been determined.  In the [place], cannabidiol has a limited approval by the Food and Drug Administration (FDA) for treatment of only two types of epilepsy disorders. The side effects of long-term use of the drug include somnolence, decreased appetite, diarrhea, fatigue, malaise, weakness, sleeping problems, and others.  CBD remains a Schedule I drug prohibited for any use.  Legality: Some manufacturers ship CBD products nationally, an illegal action which the FDA has not enforced in 2018, with CBD remaining the subject of an FDA investigational new drug evaluation, and is not considered legal as a dietary supplement or food ingredient as of December 2018. Federal illegality has made it difficult historically to conduct research on CBD. CBD is openly sold in head shops and health food stores in some states where such sales have not been explicitly legalized.  Warning: Because it is not FDA approved for general use or treatment of pain, it is not required to undergo the same manufacturing controls as prescription drugs.  This means that the available cannabidiol (CBD) may be contaminated with THC.  If this is the  case, it will trigger a positive urine drug screen (UDS) test for cannabinoids (Marijuana).  Because a positive UDS for illicit  substances is a violation of our medication agreement, your opioid analgesics (pain medicine) may be permanently discontinued. (Last update: 11/01/2017) ____________________________________________________________________________________________   ALL prescriptions were e-scribed to your pharmacy of choice.

## 2018-09-16 NOTE — Telephone Encounter (Signed)
Pt called and stated that when she picked up her prescription of oxycodone that it was only for 1 month and she thought Dr Delane Ginger was giving her 2 months worth of medication since her appt isn't until April.

## 2018-09-17 MED ORDER — OXYCODONE HCL 5 MG PO TABS: 5.0000 mg | ORAL_TABLET | Freq: Three times a day (TID) | ORAL | 0 refills | Status: DC | PRN

## 2018-09-17 NOTE — Telephone Encounter (Signed)
Dr. Dossie Arbour will send another script for Oxycodone to pharmacy. Patient notified per voicemail.

## 2018-09-17 NOTE — Addendum Note (Signed)
Addended by: Milinda Pointer A on: 09/17/2018 03:21 PM   Modules accepted: Orders

## 2018-09-19 LAB — TOXASSURE SELECT 13 (MW), URINE

## 2018-09-24 ENCOUNTER — Encounter: Payer: Medicare HMO | Admitting: Nurse Practitioner

## 2018-09-26 ENCOUNTER — Other Ambulatory Visit: Payer: Self-pay | Admitting: Family Medicine

## 2018-09-26 DIAGNOSIS — J302 Other seasonal allergic rhinitis: Secondary | ICD-10-CM

## 2018-10-01 ENCOUNTER — Other Ambulatory Visit: Payer: Self-pay | Admitting: Pain Medicine

## 2018-10-01 DIAGNOSIS — E559 Vitamin D deficiency, unspecified: Secondary | ICD-10-CM

## 2018-11-11 ENCOUNTER — Encounter: Payer: Self-pay | Admitting: Family Medicine

## 2018-11-11 ENCOUNTER — Other Ambulatory Visit: Payer: Self-pay

## 2018-11-11 ENCOUNTER — Ambulatory Visit (INDEPENDENT_AMBULATORY_CARE_PROVIDER_SITE_OTHER): Payer: Medicare HMO | Admitting: Family Medicine

## 2018-11-11 ENCOUNTER — Telehealth: Payer: Self-pay | Admitting: Family Medicine

## 2018-11-11 VITALS — Ht 63.0 in

## 2018-11-11 DIAGNOSIS — J302 Other seasonal allergic rhinitis: Secondary | ICD-10-CM

## 2018-11-11 MED ORDER — AZELASTINE HCL 0.05 % OP SOLN: 1.0000 [drp] | Freq: Two times a day (BID) | OPHTHALMIC | 12 refills | Status: DC | PRN

## 2018-11-11 MED ORDER — LEVOCETIRIZINE DIHYDROCHLORIDE 5 MG PO TABS: 5.0000 mg | ORAL_TABLET | Freq: Every evening | ORAL | 1 refills | Status: DC

## 2018-11-11 NOTE — Progress Notes (Signed)
Virtual Visit via Telephone Note  I connected with [name] on 11/11/18 at  3:30 PM EDT by telephone and verified that I am speaking with the correct person using two identifiers.  The patient is located in her home and I was located in my office.   I discussed the limitations, risks, security and privacy concerns of performing an evaluation and management service by telephone and the availability of in person appointments. I also discussed with the patient that there may be a patient responsible charge related to this service. The patient expressed understanding and agreed to proceed.   History of Present Illness: This is a 53 year old female who requests phone visit to discuss some recent symptoms.  For the last 5 days she has had watery itchy eyes, stuffy hold and a little bit of nasal congestion and drainage.  Her throat has felt scratchy but is not painful.  She has no ear pain but occasionally hears some popping.  She does not have cough or fever.  She has been taking daily cetirizine, nightly Flonase and sleeping with her windows closed.  She was seen last spring around this time with similar symptoms.   Observations/Objective: The patient is alert and answers questions appropriately.  She is able to carry on conversation without shortness of breath.  Assessment and Plan: 1. Seasonal allergic rhinitis, unspecified trigger -Discussed diagnosis and plan for treatment including changing her cetirizine to Xyzal and adding an antihistamine eyedrop -I have asked her to follow-up with me if she is not improved in the next 5 to 7 days -I reviewed signs symptoms of infection including purulent cough, fever, shortness of breath, wheeze, persistent sinus pressure/headache - levocetirizine (XYZAL) 5 MG tablet; Take 1 tablet (5 mg total) by mouth every evening.  Dispense: 30 tablet; Refill: 1 - azelastine (OPTIVAR) 0.05 % ophthalmic solution; Place 1 drop into both eyes 2 (two) times daily as  needed.  Dispense: 6 mL; Refill: [place], FNP-BC  [facility]  11/11/2018 4:08 PM   Follow Up Instructions:    I discussed the assessment and treatment plan with the patient. The patient was provided an opportunity to ask questions and all were answered. The patient agreed with the plan and demonstrated an understanding of the instructions.   The patient was advised to call back or seek an in-person evaluation if the symptoms worsen or if the condition fails to improve as anticipated.  I provided 9 minutes of non-face-to-face time during this encounter.   Elby Beck, FNP

## 2018-11-11 NOTE — Telephone Encounter (Signed)
Best number [phone]  Pt called stating her allergies are really back.  Eyes watering head feels stuffy.  No fever no cough  No travel.  Pt has been using Flonase and mucinex  [facility]  Please advise what else she can try or will you call her in something  No webex capability

## 2018-11-11 NOTE — Telephone Encounter (Signed)
Please see if she is agreeable to a phone visit.

## 2018-11-11 NOTE — Telephone Encounter (Signed)
Please review. Phone visit or ask more details? Thank you

## 2018-11-11 NOTE — Telephone Encounter (Signed)
Spoke with patient and appointment made for phone visit today at 3:30 pm

## 2018-11-12 ENCOUNTER — Encounter: Payer: Medicare HMO | Attending: Family Medicine | Admitting: Dietician

## 2018-11-12 ENCOUNTER — Encounter: Payer: Self-pay | Admitting: Dietician

## 2018-11-12 VITALS — Ht 63.0 in | Wt 265.8 lb

## 2018-11-12 DIAGNOSIS — M150 Primary generalized (osteo)arthritis: Secondary | ICD-10-CM | POA: Diagnosis not present

## 2018-11-12 DIAGNOSIS — G8929 Other chronic pain: Secondary | ICD-10-CM | POA: Diagnosis not present

## 2018-11-12 DIAGNOSIS — M545 Low back pain: Secondary | ICD-10-CM | POA: Diagnosis not present

## 2018-11-12 DIAGNOSIS — G4730 Sleep apnea, unspecified: Secondary | ICD-10-CM | POA: Diagnosis not present

## 2018-11-12 DIAGNOSIS — Z6841 Body Mass Index (BMI) 40.0 and over, adult: Secondary | ICD-10-CM | POA: Diagnosis not present

## 2018-11-12 DIAGNOSIS — M5442 Lumbago with sciatica, left side: Secondary | ICD-10-CM

## 2018-11-12 DIAGNOSIS — Z713 Dietary counseling and surveillance: Secondary | ICD-10-CM | POA: Insufficient documentation

## 2018-11-12 DIAGNOSIS — I10 Essential (primary) hypertension: Secondary | ICD-10-CM | POA: Insufficient documentation

## 2018-11-12 DIAGNOSIS — M5441 Lumbago with sciatica, right side: Secondary | ICD-10-CM

## 2018-11-12 NOTE — Patient Instructions (Signed)
   If you eat a vegetable meal, make sure to include beans as one of the vegetables.   Plan to eat at least a small meal or snack every 4-5 hours during the day.   Eat a small breakfast within 2 hours after getting up -- try boiled egg(s) with fruit, or 1 piece of toast with peanut butter.   Use menus to plan balanced meals; you can make substitutions from the same food group if needed.

## 2018-11-12 NOTE — Progress Notes (Signed)
Medical Nutrition Therapy: Visit start time: 1100  end time: 1200  Assessment:  Diagnosis: obesity Past medical history: HTN, sleep apnea, GERD Psychosocial issues/ stress concerns: none  Preferred learning method:  . Auditory . Visual . Hands-on   Current weight: 265.8lbs with shoes  Height: 5'3" Medications, supplements: reconciled list in medical record  Progress and evaluation:   Patient reports some weight fluctuation in recent years; weight has gradually increased since back injury 2007 and subsequent limited activity.  No previous specific diets, other than trying Slimfast drinks for a while but had GI upset.   Reports weight of about 174lbs about 15 years ago after starting on thyroid medication.    Patient reports financial constraints limiting food budget.    Physical activity: none due to back pain  Dietary Intake:  Usual eating pattern includes 2 meals and 2-3 snacks per day. Dining out frequency: 1-2 meals per week.  Breakfast: none Snack: occasionally 1 can vienna sausages, saltine crackers Lunch: about 1pm-- sandwich; leftovers; baked/ boiled foods; occasionally skips Snack: sometimes potato chips   Supper: 8pm -- chicken/ [place] neck, veg-- brussels spr, corn, potatoes. Loves breads Snack: nutty bar snack cake Beverages: water, pepsi (regular) -- does not like diet sodas. Trying to drink more water  Nutrition Care Education: Topics covered: weight control, anti-inflammatory diet Basic nutrition: basic food groups, appropriate nutrient balance, appropriate meal and snack schedule, general nutrition guidelines    Weight control: benefits of weight control; importance of low sugar and low fat foods and controlled food portions; eating at regular intervals; increasing vegetables to promote satiety and combat inflammatory response; encouraged physical movement as tolerated ie chair exercises.  Anti-inflammatory diet: emphasis on vegetables and fruits, limiting  processed/high fat meats.   Nutritional Diagnosis:  [place]-3.3 Overweight/obesity As related to limited mobility and excess calories.  As evidenced by patient with current BMI of 47.  Intervention:   Instruction as noted above.  Patient is ready to work on weight management to reduce back pain.   She plans on enlisting her husband to work on healthier habits together.  Established goals with direction from patient.  Education Materials given:  . Plate Planner . Sample meal pattern/ menus . Goals/ instructions   Learner/ who was taught:  . Patient   Level of understanding: Marland Kitchen Verbalizes/ demonstrates competency   Demonstrated degree of understanding via:   Teach back Learning barriers: . None  Willingness to learn/ readiness for change: . Acceptance, ready for change   Monitoring and Evaluation:  Dietary intake, exercise, pain level, and body weight      follow up: 12/16/18

## 2018-11-14 ENCOUNTER — Ambulatory Visit: Payer: Medicare HMO | Attending: Nurse Practitioner | Admitting: Nurse Practitioner

## 2018-11-14 ENCOUNTER — Other Ambulatory Visit: Payer: Self-pay

## 2018-11-14 DIAGNOSIS — M792 Neuralgia and neuritis, unspecified: Secondary | ICD-10-CM

## 2018-11-14 DIAGNOSIS — M47817 Spondylosis without myelopathy or radiculopathy, lumbosacral region: Secondary | ICD-10-CM

## 2018-11-14 DIAGNOSIS — M150 Primary generalized (osteo)arthritis: Secondary | ICD-10-CM

## 2018-11-14 DIAGNOSIS — M7918 Myalgia, other site: Secondary | ICD-10-CM | POA: Diagnosis not present

## 2018-11-14 DIAGNOSIS — M159 Polyosteoarthritis, unspecified: Secondary | ICD-10-CM

## 2018-11-14 DIAGNOSIS — M62838 Other muscle spasm: Secondary | ICD-10-CM

## 2018-11-14 DIAGNOSIS — G8929 Other chronic pain: Secondary | ICD-10-CM

## 2018-11-14 DIAGNOSIS — E559 Vitamin D deficiency, unspecified: Secondary | ICD-10-CM

## 2018-11-14 DIAGNOSIS — G894 Chronic pain syndrome: Secondary | ICD-10-CM

## 2018-11-14 MED ORDER — TIZANIDINE HCL 4 MG PO TABS: 4.0000 mg | ORAL_TABLET | Freq: Three times a day (TID) | ORAL | 2 refills | Status: DC | PRN

## 2018-11-14 MED ORDER — OXYCODONE HCL 5 MG PO TABS: 5.0000 mg | ORAL_TABLET | Freq: Three times a day (TID) | ORAL | 0 refills | Status: DC | PRN

## 2018-11-14 MED ORDER — MAGNESIUM 500 MG PO CAPS: 500.0000 mg | ORAL_CAPSULE | Freq: Two times a day (BID) | ORAL | 5 refills | Status: DC

## 2018-11-14 MED ORDER — MELOXICAM 15 MG PO TABS: 15.0000 mg | ORAL_TABLET | Freq: Every day | ORAL | 2 refills | Status: DC

## 2018-11-14 MED ORDER — GABAPENTIN 100 MG PO CAPS: 100.0000 mg | ORAL_CAPSULE | Freq: Four times a day (QID) | ORAL | 2 refills | Status: DC

## 2018-11-14 NOTE — Progress Notes (Signed)
Pain Management Encounter Note - Virtual Visit via Telephone Telehealth (real-time audio visits between healthcare provider and patient).  Patient's Phone No. & Preferred Pharmacy:  [phone] (home); [phone] (mobile); (Preferred) [phone]  [id number], [address] [place] [address] Phone: [phone] Fax: [phone]   Pre-screening note:  Our staff contacted Teresa Mcgee and offered her an "in person", "face-to-face" appointment versus a telephone encounter. She indicated preferring the telephone encounter, at this time.  Reason for Virtual Visit: COVID-19*  Social distancing based on CDC and AMA recommendations.   I contacted Teresa Mcgee on 11/14/2018 at 11:38 AM by telephone and clearly identified myself as Dionisio David, NP. I verified that I was speaking with the correct person using two identifiers (Name and date of birth: 1966-03-13).  Advanced Informed Consent I sought verbal advanced consent from Teresa Mcgee for telemedicine interactions and virtual visit. I informed Teresa Mcgee of the security and privacy concerns, risks, and limitations associated with performing an evaluation and management service by telephone. I also informed Teresa Mcgee of the availability of "in person" appointments and I informed her of the possibility of a patient responsible charge related to this service. Teresa Mcgee expressed understanding and agreed to proceed.   Historic Elements   Teresa Mcgee is a 53 y.o. year old, female patient evaluated today after her last encounter by our practice on 10/01/2018. Teresa Mcgee  has a past medical history of Arthritis, Bell's palsy (may 2012), Chest pain, GERD (gastroesophageal reflux disease), H/O hiatal hernia, Hyperlipidemia, Hypertension, Hypothyroidism, Shingles, Shortness of breath, and Sleep apnea. She also  has a past surgical history that includes Thyroidectomy (1998); cesarian; Partial  hysterectomy (2000); Cesarean section; Tubal ligation; and Tooth extraction. Teresa Mcgee has a current medication list which includes the following prescription(s): albuterol, amlodipine, azelastine, gnp calcium 1200, cetirizine, [facility] d[facility], ezetimibe, fluticasone, gabapentin, hydrochlorothiazide, levocetirizine, levothyroxine, magnesium, meloxicam, oxycodone, oxycodone, refresh celluvisc, and tizanidine. She  reports that she has been smoking cigarettes. She has a 4.00 pack-year smoking history. She has never used smokeless tobacco. She reports that she does not drink alcohol or use drugs. Teresa Mcgee has No Known Allergies.   HPI  I last saw her on 07/01/2018. She is being evaluated for medication management. She is having lower back pain that is a 2/10. She describes her pain as a weight. She does not feel like she has leg pain right now. She admits that walking increases the pain. The morning is her worse time for pain. When she is up walking she has pain that goes all the way into her feet into her toes. She has tingling. She denies any weakness. She feels like the pain is about the same. She denies any new concerns. She denies any side effects of her medication. She is seeing the nutritionist  Pharmacotherapy Assessment  Analgesic: Oxycodone IR 5 mg 1 tablet p.o. every 8 hours PRN for pain (15 mg/dayof oxycodone) MME/day: 22.5 mg/day.   Monitoring: Pharmacotherapy: No side-effects or adverse reactions reported. [place] PMP: PDMP reviewed during this encounter.       Compliance: No problems identified. Plan: Refer to "POC".  Review of recent tests  DG C-Arm 1-60 Min-No Report Fluoroscopy was utilized by the requesting physician.  No radiographic  interpretation.    Office Visit on 09/16/2018  Component Date Value Ref Range Status  . Summary 09/16/2018 FINAL   Final   Comment: ==================================================================== TOXASSURE SELECT 69  (MW) ====================================================================  Test                             Result       Flag       Units Drug Present and Declared for Prescription Verification   Oxycodone                      218          EXPECTED   ng/mg creat   Oxymorphone                    388          EXPECTED   ng/mg creat   Noroxycodone                   239          EXPECTED   ng/mg creat   Noroxymorphone                 64           EXPECTED   ng/mg creat    Sources of oxycodone are scheduled prescription medications.    Oxymorphone, noroxycodone, and noroxymorphone are expected    metabolites of oxycodone. Oxymorphone is also available as a    scheduled prescription medication. ==================================================================== Test                      Result    Flag   Units      Ref Range   Creatinine              114              mg/dL      >=20 ======                          ============================================================== Declared Medications:  The flagging and interpretation on this report are based on the  following declared medications.  Unexpected results may arise from  inaccuracies in the declared medications.  **Note: The testing scope of this panel includes these medications:  Oxycodone  **Note: The testing scope of this panel does not include following  reported medications:  Albuterol  Amlodipine (Norvasc)  Calcium  Cetirizine (Zyrtec)  Eye Drop (Refresh)  Ezetimibe (Zetia)  Fluticasone (Flonase)  Gabapentin  Hydrochlorothiazide  Levothyroxine  Meloxicam (Mobic)  Tizanidine  Vitamin D3 ==================================================================== For clinical consultation, please call [phone]. ====================================================================    Assessment  The primary encounter diagnosis was Spondylosis without myelopathy or radiculopathy, lumbosacral region. Diagnoses of Neurogenic  pain, Osteoarthritis involving multiple joints, Chronic musculoskeletal pain, Muscle spasms of neck, Chronic pain syndrome, and Vitamin D deficiency were also pertinent to this visit.  Plan of Care  I discussed the assessment and treatment plan with the patient. The patient was provided an opportunity to ask questions and all were answered. The patient agreed with the plan and demonstrated an understanding of the instructions.  Patient advised to call back or seek an in-person evaluation if the symptoms or condition worsens.  I have changed Jodelle Red. Snelson's oxyCODONE. I am also having her maintain her amLODipine, hydrochlorothiazide, Refresh Celluvisc, fluticasone, GNP Calcium 1200, levothyroxine, [facility] D[facility], albuterol, ezetimibe, cetirizine, levocetirizine, azelastine, gabapentin, meloxicam, tiZANidine, oxyCODONE, and Magnesium. Pharmacotherapy (Medications Ordered): Meds ordered this encounter  Medications  . gabapentin (NEURONTIN) 100 MG capsule    Sig: Take  1 capsule (100 mg total) by mouth 4 (four) times daily. Follow written titration schedule.    Dispense:  120 capsule    Refill:  2    Do not place medication on "Automatic Refill". Fill one day early if pharmacy is closed on scheduled refill date.    Order Specific Question:   Supervising Provider    Answer:   Milinda Pointer [phone]  . meloxicam (MOBIC) 15 MG tablet    Sig: Take 1 tablet (15 mg total) by mouth daily.    Dispense:  30 tablet    Refill:  2    Do not add this medication to the electronic "Automatic Refill" notification system. Patient may have prescription filled one day early if pharmacy is closed on scheduled refill date.    Order Specific Question:   Supervising Provider    Answer:   Milinda Pointer [phone]  . tiZANidine (ZANAFLEX) 4 MG tablet    Sig: Take 1 tablet (4 mg total) by mouth every 8 (eight) hours as needed for muscle spasms.    Dispense:  90 tablet    Refill:  2    Do not place this medication,  or any other prescription from our practice, on "Automatic Refill". Patient may have prescription filled one day early if pharmacy is closed on scheduled refill date.    Order Specific Question:   Supervising Provider    Answer:   Milinda Pointer [phone]  . oxyCODONE (OXY IR/ROXICODONE) 5 MG immediate release tablet    Sig: Take 1 tablet (5 mg total) by mouth every 8 (eight) hours as needed for up to 30 days for severe pain. Must last 30 days.    Dispense:  90 tablet    Refill:  0    [place] STOP ACT - Not applicable. Fill one day early if pharmacy is closed on scheduled refill date.    Order Specific Question:   Supervising Provider    Answer:   Milinda Pointer [phone]  . oxyCODONE (OXY IR/ROXICODONE) 5 MG immediate release tablet    Sig: Take 1 tablet (5 mg total) by mouth every 8 (eight) hours as needed for up to 30 days for severe pain.    Dispense:  90 tablet    Refill:  0     STOP ACT - Not applicable. Fill one day early if pharmacy is closed on scheduled refill date.    Order Specific Question:   Supervising Provider    Answer:   Milinda Pointer [phone]  . Magnesium 500 MG CAPS    Sig: Take 1 capsule (500 mg total) by mouth 2 (two) times daily at 8 am and 10 pm.    Dispense:  60 capsule    Refill:  5    Do not place medication on "Automatic Refill".  The patient may use similar over-the-counter product.    Order Specific Question:   Supervising Provider    Answer:   Milinda Pointer [phone]   Orders:  No orders of the defined types were placed in this encounter.  Follow-up plan:   Return in about 3 months (around 02/13/2019) for MedMgmt.   Total duration of non-face-to-face encounter: 14 minutes.  Note by: Dionisio David, NP Date: 11/14/2018; Time: 11:38 AM  Disclaimer:  * Given the special circumstances of the COVID-19 pandemic, the federal government has announced that the Office for Civil Rights (OCR) will exercise its enforcement discretion and will not impose  penalties on physicians using telehealth in the event of noncompliance  with regulatory requirements under the [employer] and Accountability Act (HIPAA) in connection with the good faith provision of telehealth during the VDIXV-85 national public health emergency. (AMA)

## 2018-11-20 ENCOUNTER — Other Ambulatory Visit: Payer: Self-pay | Admitting: Family Medicine

## 2018-11-20 DIAGNOSIS — I10 Essential (primary) hypertension: Secondary | ICD-10-CM

## 2018-11-29 ENCOUNTER — Other Ambulatory Visit: Payer: Self-pay | Admitting: Family Medicine

## 2018-11-29 ENCOUNTER — Telehealth: Payer: Self-pay

## 2018-11-29 DIAGNOSIS — E039 Hypothyroidism, unspecified: Secondary | ICD-10-CM

## 2018-11-29 DIAGNOSIS — J302 Other seasonal allergic rhinitis: Secondary | ICD-10-CM

## 2018-11-29 MED ORDER — PREDNISONE 20 MG PO TABS: 20.0000 mg | ORAL_TABLET | Freq: Every day | ORAL | 0 refills | Status: DC

## 2018-11-29 NOTE — Telephone Encounter (Signed)
Please call patient and let her know that I have sent in 6 days worth of prednisone to help with inflammation. If the xyzal is not working, she can go back to her previous antihistamine. If not better with prednisone, please let me know. It will raise her blood sugar, please watch diet carefully.

## 2018-11-29 NOTE — Telephone Encounter (Signed)
Notified patient of Debbie's instructions.  She verbalizes understanding and will follow up as needed if not improving.

## 2018-11-29 NOTE — Telephone Encounter (Signed)
Pt had phone visit on 11/11/18.pt was taking zyrtec and changed to xyzal 5 mg. Xyzal not effective for pt; pt having head congestion and H/A. No fever, cough, SOB. No travel or known exposure to covid or flu.pt request different med for allergies. [facility].

## 2018-11-29 NOTE — Telephone Encounter (Signed)
Please see if she can come by for a lab visit prior to refilling.

## 2018-12-02 NOTE — Telephone Encounter (Signed)
Lab appointment scheduled for 12/03/2018 at 10:45 am.

## 2018-12-03 ENCOUNTER — Other Ambulatory Visit: Payer: Self-pay | Admitting: Family Medicine

## 2018-12-03 ENCOUNTER — Other Ambulatory Visit (INDEPENDENT_AMBULATORY_CARE_PROVIDER_SITE_OTHER): Payer: Medicare HMO

## 2018-12-03 ENCOUNTER — Other Ambulatory Visit: Payer: Self-pay

## 2018-12-03 DIAGNOSIS — J302 Other seasonal allergic rhinitis: Secondary | ICD-10-CM

## 2018-12-03 DIAGNOSIS — E039 Hypothyroidism, unspecified: Secondary | ICD-10-CM | POA: Diagnosis not present

## 2018-12-03 LAB — TSH: TSH: 6.57 u[IU]/mL — ABNORMAL HIGH (ref 0.35–4.50)

## 2018-12-07 ENCOUNTER — Other Ambulatory Visit: Payer: Self-pay | Admitting: Nurse Practitioner

## 2018-12-07 DIAGNOSIS — M792 Neuralgia and neuritis, unspecified: Secondary | ICD-10-CM

## 2018-12-16 ENCOUNTER — Ambulatory Visit: Payer: Medicare HMO | Admitting: Dietician

## 2018-12-27 ENCOUNTER — Other Ambulatory Visit: Payer: Self-pay | Admitting: Family Medicine

## 2018-12-27 DIAGNOSIS — I10 Essential (primary) hypertension: Secondary | ICD-10-CM

## 2018-12-31 ENCOUNTER — Encounter: Payer: Self-pay | Admitting: Dietician

## 2018-12-31 ENCOUNTER — Other Ambulatory Visit: Payer: Self-pay

## 2018-12-31 ENCOUNTER — Encounter: Payer: Medicare HMO | Attending: Family Medicine | Admitting: Dietician

## 2018-12-31 VITALS — Ht 63.0 in | Wt 260.9 lb

## 2018-12-31 DIAGNOSIS — M150 Primary generalized (osteo)arthritis: Secondary | ICD-10-CM | POA: Insufficient documentation

## 2018-12-31 DIAGNOSIS — Z713 Dietary counseling and surveillance: Secondary | ICD-10-CM | POA: Insufficient documentation

## 2018-12-31 DIAGNOSIS — Z6841 Body Mass Index (BMI) 40.0 and over, adult: Secondary | ICD-10-CM | POA: Diagnosis not present

## 2018-12-31 DIAGNOSIS — M545 Low back pain: Secondary | ICD-10-CM | POA: Insufficient documentation

## 2018-12-31 DIAGNOSIS — G8929 Other chronic pain: Secondary | ICD-10-CM | POA: Diagnosis not present

## 2018-12-31 DIAGNOSIS — G4730 Sleep apnea, unspecified: Secondary | ICD-10-CM | POA: Diagnosis not present

## 2018-12-31 DIAGNOSIS — I10 Essential (primary) hypertension: Secondary | ICD-10-CM | POA: Diagnosis not present

## 2018-12-31 NOTE — Patient Instructions (Signed)
   Continue with your current eating pattern of eating every 4-5 hours, limiting sweets and high fat foods.   Gradually resume some regular exercise  [place] job making healthy changes!

## 2018-12-31 NOTE — Progress Notes (Signed)
Medical Nutrition Therapy: Visit start time: 1100  end time: 1130  Assessment:  Diagnosis: obesity Medical history changes: no changes Psychosocial issues/ stress concerns: none  Current weight: 260.9lbs Height: 5'3" Medications, supplement changes: no changes per patient  Progress and evaluation:   Weight loss of about 5lbs in 6weeks. She has stopped eating sweets and decreased intake of regular soda.    She and her husband are working on weight loss; husband has lost about 30lbs.   Patient reports increased vegetable and fruit intake.  Physical activity: yardwork walking behind mower 1x a week; cooks for other people  Dietary Intake:  Usual eating pattern includes 2-3 meals and 1 snacks per day. Dining out frequency: 1-2 meals per week. [place], [place], [place], seafood (eats 1/2 portions)  Breakfast: today sm portion bbq, sm portion slaw, few grapes; occ skips if sleeping later Snack: none Lunch: 1pm-- 5/18 stir fried smoked sausage andouille + onion, bell peppers + spices; often sandwich; leftovers; salad with fresh fruit Snack: pork rinds, sm portion (stopped due to mouth irritation); fruit-- bananas, grapes, tangerine/ mandarin oranges Supper: 8pm chicken/ [place] + corn on cob, brussels sprouts, etc, limits potatoes.  Snack: stopped snack cakes, usually no snacks now Beverages: water 2 bottles daily, light cran apple juice, 1-2 glasses pepsi daily, gradually decreasing   Nutrition Care Education: Topics covered: weight control Basic nutrition: appropriate nutrient balance, appropriate meal and snack schedule  Weight control: reviewed patient's progress since previous visit; discussed benefits of weight control, importance of physical activity   Nutritional Diagnosis:  [place]-3.3 Overweight/obesity As related to history of excess calories, hypothyroidism, inactivity.  As evidenced by patient with current BMI of 46.22.  Intervention:   Instruction and discussion as noted  above.  Commended patient for changes made and progress with weight loss.  Updated goals with input from patient.   Education Materials given:  Marland Kitchen Goals/ instructions   Learner/ who was taught:  . Patient    Level of understanding: Marland Kitchen Verbalizes/ demonstrates competency   Demonstrated degree of understanding via:   Teach back Learning barriers: . None  Willingness to learn/ readiness for change: . Eager, change in progress   Monitoring and Evaluation:  Dietary intake, exercise, and body weight      follow up: 02/11/19

## 2019-01-15 ENCOUNTER — Telehealth: Payer: Self-pay | Admitting: *Deleted

## 2019-01-15 NOTE — Telephone Encounter (Signed)
Patient was due to run out of medications on 01-14-2019, but her appointment was made for 02-03-2019?  She needs an appointment with Dr Dossie Arbour for med refill.

## 2019-01-16 ENCOUNTER — Telehealth: Payer: Self-pay | Admitting: *Deleted

## 2019-01-17 ENCOUNTER — Encounter: Payer: Self-pay | Admitting: Pain Medicine

## 2019-01-19 NOTE — Progress Notes (Signed)
Pain Management Virtual Encounter Note - Virtual Visit via Telephone Telehealth (real-time audio visits between healthcare provider and patient).   Patient's Phone No. & Preferred Pharmacy:  [phone] (home); [phone] (mobile); (Preferred) [phone] No e-mail address on record  [id number] - [place], [address] [place] [address] Phone: [phone] Fax: [phone]    Pre-screening note:  Our staff contacted Teresa Mcgee and offered her an "in person", "face-to-face" appointment versus a telephone encounter. She indicated preferring the telephone encounter, at this time.   Reason for Virtual Visit: COVID-19*  Social distancing based on CDC and AMA recommendations.   I contacted Teresa Mcgee on 01/20/2019 at 12:26 PM via telephone.      I clearly identified myself as Gaspar Cola, MD. I verified that I was speaking with the correct person using two identifiers (Name: Teresa Mcgee, and date of birth: 1966-01-18).  Advanced Informed Consent I sought verbal advanced consent from Teresa Mcgee for virtual visit interactions. I informed Teresa Mcgee of possible security and privacy concerns, risks, and limitations associated with providing "not-in-person" medical evaluation and management services. I also informed Teresa Mcgee of the availability of "in-person" appointments. Finally, I informed her that there would be a charge for the virtual visit and that she could be  personally, fully or partially, financially responsible for it. Teresa Mcgee expressed understanding and agreed to proceed.   Historic Elements   Teresa Mcgee is a 53 y.o. year old, female patient evaluated today after her last encounter by our practice on 01/16/2019. Teresa Mcgee  has a past medical history of Arthritis, Bell's palsy (may 2012), Chest pain, GERD (gastroesophageal reflux disease), H/O hiatal hernia, Hyperlipidemia, Hypertension, Hypothyroidism,  Shingles, Shortness of breath, and Sleep apnea. She also  has a past surgical history that includes Thyroidectomy (1998); cesarian; Partial hysterectomy (2000); Cesarean section; Tubal ligation; and Tooth extraction. [name] has a current medication list which includes the following prescription(s): albuterol, amlodipine, azelastine, [facility] d3, ezetimibe, fluticasone, gabapentin, hydrochlorothiazide, levocetirizine, levothyroxine, magnesium, meloxicam, refresh celluvisc, tizanidine, oxycodone, oxycodone, and oxycodone. She  reports that she has been smoking cigarettes. She has a 4.00 pack-year smoking history. She has never used smokeless tobacco. She reports that she does not drink alcohol or use drugs. Teresa Mcgee has No Known Allergies.   HPI  Today, she is being contacted for medication management.  The patient indicates doing extremely well with the medications and at this point, we plan to give her 3 months refills and continue monitoring her medication use.  She is currently using the gabapentin 4 times a day but she still having some neuropathic pain that she describes as a tingling sensation between her toes.  I asked her if she thought that she could use a little bit more gabapentin and she indicated that possibly at bedtime.  In view of this, today we will change her prescription so that she uses the 100 mg 3 times a day and she can take 200 to 300 mg of gabapentin at bedtime.  We will follow-up with the patient in approximately 3 months at which time we will assess how she is doing with that particular change.  Pharmacotherapy Assessment  Analgesic: OxycodoneIR5 mg 1 tablet p.o. every 8 hours PRN for pain (15 mg/dayof oxycodone) MME/day:22.5mg /day   Monitoring: Pharmacotherapy: No side-effects or adverse reactions reported. [place] PMP: PDMP reviewed during this encounter.       Compliance: No problems identified. Effectiveness: Clinically acceptable.  Plan: Refer to "POC".  Pertinent Labs    SAFETY SCREENING Profile Lab Results  Component Value Date   HIV NON REACTIVE 03/21/2011   Renal Function Lab Results  Component Value Date   BUN 12 05/25/2018   CREATININE 0.99 05/25/2018   BCR 8 (L) 01/24/2018   GFRAA >60 05/25/2018   GFRNONAA >60 05/25/2018   Hepatic Function Lab Results  Component Value Date   AST 40 (H) 05/29/2018   ALT 44 (H) 05/29/2018   ALBUMIN 4.4 05/29/2018   UDS Summary  Date Value Ref Range Status  09/16/2018 FINAL  Final    Comment:    ==================================================================== TOXASSURE SELECT 13 (MW) ==================================================================== Test                             Result       Flag       Units Drug Present and Declared for Prescription Verification   Oxycodone                      218          EXPECTED   ng/mg creat   Oxymorphone                    388          EXPECTED   ng/mg creat   Noroxycodone                   239          EXPECTED   ng/mg creat   Noroxymorphone                 64           EXPECTED   ng/mg creat    Sources of oxycodone are scheduled prescription medications.    Oxymorphone, noroxycodone, and noroxymorphone are expected    metabolites of oxycodone. Oxymorphone is also available as a    scheduled prescription medication. ==================================================================== Test                      Result    Flag   Units      Ref Range   Creatinine              114              mg/dL      >=20 ==================================================================== Declared Medications:  The flagging and interpretation on this report are based on the  following declared medications.  Unexpected results may arise from  inaccuracies in the declared medications.  **Note: The testing scope of this panel includes these medications:  Oxycodone  **Note: The testing scope of this panel does not include following  reported medications:   Albuterol  Amlodipine (Norvasc)  Calcium  Cetirizine (Zyrtec)  Eye Drop (Refresh)  Ezetimibe (Zetia)  Fluticasone (Flonase)  Gabapentin  Hydrochlorothiazide  Levothyroxine  Meloxicam (Mobic)  Tizanidine  Vitamin D3 ==================================================================== For clinical consultation, please call [phone]. ====================================================================    Note: Above Lab results reviewed.  Recent imaging  DG C-Arm 1-60 Min-No Report Fluoroscopy was utilized by the requesting physician.  No radiographic  interpretation.   Assessment  The primary encounter diagnosis was Chronic pain syndrome. Diagnoses of Chronic low back pain (Primary Area of Pain) (Bilateral) (L>R) w/o sciatica, DDD (degenerative disc disease), lumbosacral, Chronic lower extremity pain ( Secondary  Area of  Pain) (  Bilateral) (L>R), Chronic neck pain (Tertiary Area of Pain) (Bilateral) (L>R), DDD (degenerative disc disease), cervical, Chronic upper extremity pain (Fourth Area of Pain) (Bilateral) (L>R), Chronic musculoskeletal pain, Muscle spasms of neck, Neurogenic pain, and Osteoarthritis involving multiple joints were also pertinent to this visit.  Plan of Care  I have discontinued Teresa Red. Mcgee's GNP Calcium 1200, cetirizine, oxyCODONE, and predniSONE. I have also changed her oxyCODONE and gabapentin. Additionally, I am having her start on oxyCODONE and oxyCODONE. Lastly, I am having her maintain her Refresh Celluvisc, fluticasone, [facility] D3, albuterol, ezetimibe, azelastine, Magnesium, hydrochlorothiazide, levothyroxine, levocetirizine, amLODipine, tiZANidine, and meloxicam.  Pharmacotherapy (Medications Ordered): Meds ordered this encounter  Medications  . tiZANidine (ZANAFLEX) 4 MG tablet    Sig: Take 1 tablet (4 mg total) by mouth every 8 (eight) hours as needed for muscle spasms.    Dispense:  90 tablet    Refill:  2    Fill one day early if pharmacy  is closed on scheduled refill date. May substitute for generic if available.  Marland Kitchen oxyCODONE (OXY IR/ROXICODONE) 5 MG immediate release tablet    Sig: Take 1 tablet (5 mg total) by mouth every 8 (eight) hours as needed for up to 30 days for severe pain. Must last 30 days    Dispense:  90 tablet    Refill:  0    Chronic Pain: STOP Act - Not applicable. Fill 1 day early if closed on scheduled refill date. Do not fill until: 01/20/2019. To last until: 02/19/2019. Instruct to avoid benzodiazepines within 8 hours of opioid.  Marland Kitchen gabapentin (NEURONTIN) 100 MG capsule    Sig: Take 1 capsule (100 mg total) by mouth 3 (three) times daily AND 1-3 capsules (100-300 mg total) at bedtime. Follow written titration schedule.Marland Kitchen    Dispense:  180 capsule    Refill:  2    Fill one day early if pharmacy is closed on scheduled refill date. May substitute for generic if available.  . meloxicam (MOBIC) 15 MG tablet    Sig: Take 1 tablet (15 mg total) by mouth daily.    Dispense:  30 tablet    Refill:  2    Fill one day early if pharmacy is closed on scheduled refill date. May substitute for generic if available.  Marland Kitchen oxyCODONE (OXY IR/ROXICODONE) 5 MG immediate release tablet    Sig: Take 1 tablet (5 mg total) by mouth every 8 (eight) hours as needed for up to 30 days for severe pain. Must last 30 days    Dispense:  90 tablet    Refill:  0    Chronic Pain: STOP Act - Not applicable. Fill 1 day early if closed on scheduled refill date. Do not fill until: 02/19/2019. To last until: 03/21/2019. Instruct to avoid benzodiazepines within 8 hours of opioid.  Marland Kitchen oxyCODONE (OXY IR/ROXICODONE) 5 MG immediate release tablet    Sig: Take 1 tablet (5 mg total) by mouth every 8 (eight) hours as needed for up to 30 days for severe pain. Must last 30 days    Dispense:  90 tablet    Refill:  0    Chronic Pain: STOP Act - Not applicable. Fill 1 day early if closed on scheduled refill date. Do not fill until: 03/21/2019. To last until: 04/20/2019.  Instruct to avoid benzodiazepines within 8 hours of opioid.   Orders:  No orders of the defined types were placed in this encounter.  Follow-up plan:   Return in about 3 months (  around 04/16/2019) for (Virtual), E/M, (Med-Mgmt).    I discussed the assessment and treatment plan with the patient. The patient was provided an opportunity to ask questions and all were answered. The patient agreed with the plan and demonstrated an understanding of the instructions.  Patient advised to call back or seek an in-person evaluation if the symptoms or condition worsens.  Total duration of non-face-to-face encounter: 13 minutes.  Note by: Gaspar Cola, MD Date: 01/20/2019; Time: 12:26 PM  Note: This dictation was prepared with Dragon dictation. Any transcriptional errors that may result from this process are unintentional.  Disclaimer:  * Given the special circumstances of the COVID-19 pandemic, the federal government has announced that the Office for Civil Rights (OCR) will exercise its enforcement discretion and will not impose penalties on physicians using telehealth in the event of noncompliance with regulatory requirements under the [place] (HIPAA) in connection with the good faith provision of telehealth during the JDYNX-83 national public health emergency. ([place])

## 2019-01-20 ENCOUNTER — Other Ambulatory Visit: Payer: Self-pay

## 2019-01-20 ENCOUNTER — Ambulatory Visit: Payer: Medicare HMO | Attending: Pain Medicine | Admitting: Pain Medicine

## 2019-01-20 DIAGNOSIS — M79604 Pain in right leg: Secondary | ICD-10-CM | POA: Diagnosis not present

## 2019-01-20 DIAGNOSIS — G894 Chronic pain syndrome: Secondary | ICD-10-CM

## 2019-01-20 DIAGNOSIS — M545 Low back pain: Secondary | ICD-10-CM

## 2019-01-20 DIAGNOSIS — M159 Polyosteoarthritis, unspecified: Secondary | ICD-10-CM

## 2019-01-20 DIAGNOSIS — M79605 Pain in left leg: Secondary | ICD-10-CM

## 2019-01-20 DIAGNOSIS — M79602 Pain in left arm: Secondary | ICD-10-CM

## 2019-01-20 DIAGNOSIS — M5137 Other intervertebral disc degeneration, lumbosacral region: Secondary | ICD-10-CM | POA: Diagnosis not present

## 2019-01-20 DIAGNOSIS — M7918 Myalgia, other site: Secondary | ICD-10-CM

## 2019-01-20 DIAGNOSIS — M792 Neuralgia and neuritis, unspecified: Secondary | ICD-10-CM

## 2019-01-20 DIAGNOSIS — G8929 Other chronic pain: Secondary | ICD-10-CM

## 2019-01-20 DIAGNOSIS — M79601 Pain in right arm: Secondary | ICD-10-CM

## 2019-01-20 DIAGNOSIS — M542 Cervicalgia: Secondary | ICD-10-CM

## 2019-01-20 DIAGNOSIS — M150 Primary generalized (osteo)arthritis: Secondary | ICD-10-CM

## 2019-01-20 DIAGNOSIS — M62838 Other muscle spasm: Secondary | ICD-10-CM

## 2019-01-20 DIAGNOSIS — M5030 Other cervical disc degeneration, unspecified cervical region: Secondary | ICD-10-CM

## 2019-01-20 MED ORDER — TIZANIDINE HCL 4 MG PO TABS: 4.0000 mg | ORAL_TABLET | Freq: Three times a day (TID) | ORAL | 2 refills | Status: DC | PRN

## 2019-01-20 MED ORDER — OXYCODONE HCL 5 MG PO TABS: 5.0000 mg | ORAL_TABLET | Freq: Three times a day (TID) | ORAL | 0 refills | Status: DC | PRN

## 2019-01-20 MED ORDER — GABAPENTIN 100 MG PO CAPS: ORAL_CAPSULE | ORAL | 2 refills | Status: DC

## 2019-01-20 MED ORDER — MELOXICAM 15 MG PO TABS: 15.0000 mg | ORAL_TABLET | Freq: Every day | ORAL | 2 refills | Status: DC

## 2019-01-20 NOTE — Patient Instructions (Signed)
____________________________________________________________________________________________  Medication Rules  Purpose: To inform patients, and their family members, of our rules and regulations.  Applies to: All patients receiving prescriptions (written or electronic).  Pharmacy of record: Pharmacy where electronic prescriptions will be sent. If written prescriptions are taken to a different pharmacy, please inform the nursing staff. The pharmacy listed in the electronic medical record should be the one where you would like electronic prescriptions to be sent.  Electronic prescriptions: In compliance with the [place]reng[place] Opioid Misuse Prevention (STOP) Act of 2017 (Session Law 2017-74/H243), effective August 14, 2018, all controlled substances must be electronically prescribed. Calling prescriptions to the pharmacy will cease to exist.  Prescription refills: Only during scheduled appointments. Applies to all prescriptions.  NOTE: The following applies primarily to controlled substances (Opioid* Pain Medications).   Patient's responsibilities: 1. Pain Pills: Bring all pain pills to every appointment (except for procedure appointments). 2. Pill Bottles: Bring pills in original pharmacy bottle. Always bring the newest bottle. Bring bottle, even if empty. 3. Medication refills: You are responsible for knowing and keeping track of what medications you take and those you need refilled. The day before your appointment: write a list of all prescriptions that need to be refilled. The day of the appointment: give the list to the admitting nurse. Prescriptions will be written only during appointments. No prescriptions will be written on procedure days. If you forget a medication: it will not be "Called in", "Faxed", or "electronically sent". You will need to get another appointment to get these prescribed. No early refills. Do not call asking to have your prescription filled  early. 4. Prescription Accuracy: You are responsible for carefully inspecting your prescriptions before leaving our office. Have the discharge nurse carefully go over each prescription with you, before taking them home. Make sure that your name is accurately spelled, that your address is correct. Check the name and dose of your medication to make sure it is accurate. Check the number of pills, and the written instructions to make sure they are clear and accurate. Make sure that you are given enough medication to last until your next medication refill appointment. 5. Taking Medication: Take medication as prescribed. When it comes to controlled substances, taking less pills or less frequently than prescribed is permitted and encouraged. Never take more pills than instructed. Never take medication more frequently than prescribed.  6. Inform other Doctors: Always inform, all of your healthcare providers, of all the medications you take. 7. Pain Medication from other Providers: You are not allowed to accept any additional pain medication from any other Doctor or Healthcare provider. There are two exceptions to this rule. (see below) In the event that you require additional pain medication, you are responsible for notifying us, as stated below. 8. Medication Agreement: You are responsible for carefully reading and following our Medication Agreement. This must be signed before receiving any prescriptions from our practice. Safely store a copy of your signed Agreement. Violations to the Agreement will result in no further prescriptions. (Additional copies of our Medication Agreement are available upon request.) 9. Laws, Rules, & Regulations: All patients are expected to follow all Federal and State Laws, Statutes, Rules, & Regulations. Ignorance of the Laws does not constitute a valid excuse. The use of any illegal substances is prohibited. 10. Adopted CDC guidelines & recommendations: Target dosing levels will be  at or below 60 MME/day. Use of benzodiazepines** is not recommended.  Exceptions: There are only two exceptions to the rule of not   receiving pain medications from other Healthcare Providers. 1. Exception #1 (Emergencies): In the event of an emergency (i.e.: accident requiring emergency care), you are allowed to receive additional pain medication. However, you are responsible for: As soon as you are able, call our office [phone], at any time of the day or night, and leave a message stating your name, the date and nature of the emergency, and the name and dose of the medication prescribed. In the event that your call is answered by a member of our staff, make sure to document and save the date, time, and the name of the person that took your information.  2. Exception #2 (Planned Surgery): In the event that you are scheduled by another doctor or dentist to have any type of surgery or procedure, you are allowed (for a period no longer than 30 days), to receive additional pain medication, for the acute post-op pain. However, in this case, you are responsible for picking up a copy of our "Post-op Pain Management for Surgeons" handout, and giving it to your surgeon or dentist. This document is available at our office, and does not require an appointment to obtain it. Simply go to our office during business hours (Monday-Thursday from 8:00 AM to 4:00 PM) (Friday 8:00 AM to 12:00 Noon) or if you have a scheduled appointment with us, prior to your surgery, and ask for it by name. In addition, you will need to provide us with your name, name of your surgeon, type of surgery, and date of procedure or surgery.  *Opioid medications include: morphine, codeine, oxycodone, oxymorphone, hydrocodone, hydromorphone, meperidine, tramadol, tapentadol, buprenorphine, fentanyl, methadone. **Benzodiazepine medications include: diazepam (Valium), alprazolam (Xanax), clonazepam (Klonopine), lorazepam (Ativan), clorazepate  (Tranxene), chlordiazepoxide (Librium), estazolam (Prosom), oxazepam (Serax), temazepam (Restoril), triazolam (Halcion) (Last updated: 10/11/2017) ____________________________________________________________________________________________   ____________________________________________________________________________________________  Medication Recommendations and Reminders  Applies to: All patients receiving prescriptions (written and/or electronic).  Medication Rules & Regulations: These rules and regulations exist for your safety and that of others. They are not flexible and neither are we. Dismissing or ignoring them will be considered "non-compliance" with medication therapy, resulting in complete and irreversible termination of such therapy. (See document titled "Medication Rules" for more details.) In all conscience, because of safety reasons, we cannot continue providing a therapy where the patient does not follow instructions.  Pharmacy of record:   Definition: This is the pharmacy where your electronic prescriptions will be sent.   We do not endorse any particular pharmacy.  You are not restricted in your choice of pharmacy.  The pharmacy listed in the electronic medical record should be the one where you want electronic prescriptions to be sent.  If you choose to change pharmacy, simply notify our nursing staff of your choice of new pharmacy.  Recommendations:  Keep all of your pain medications in a safe place, under lock and key, even if you live alone.   After you fill your prescription, take 1 week's worth of pills and put them away in a safe place. You should keep a separate, properly labeled bottle for this purpose. The remainder should be kept in the original bottle. Use this as your primary supply, until it runs out. Once it's gone, then you know that you have 1 week's worth of medicine, and it is time to come in for a prescription refill. If you do this correctly, it  is unlikely that you will ever run out of medicine.  To make sure that the above recommendation works,   it is very important that you make sure your medication refill appointments are scheduled at least 1 week before you run out of medicine. To do this in an effective manner, make sure that you do not leave the office without scheduling your next medication management appointment. Always ask the nursing staff to show you in your prescription , when your medication will be running out. Then arrange for the receptionist to get you a return appointment, at least 7 days before you run out of medicine. Do not wait until you have 1 or 2 pills left, to come in. This is very poor planning and does not take into consideration that we may need to cancel appointments due to bad weather, sickness, or emergencies affecting our staff.  "Partial Fill": If for any reason your pharmacy does not have enough pills/tablets to completely fill or refill your prescription, do not allow for a "partial fill". You will need a separate prescription to fill the remaining amount, which we will not provide. If the reason for the partial fill is your insurance, you will need to talk to the pharmacist about payment alternatives for the remaining tablets, but again, do not accept a partial fill.  Prescription refills and/or changes in medication(s):   Prescription refills, and/or changes in dose or medication, will be conducted only during scheduled medication management appointments. (Applies to both, written and electronic prescriptions.)  No refills on procedure days. No medication will be changed or started on procedure days. No changes, adjustments, and/or refills will be conducted on a procedure day. Doing so will interfere with the diagnostic portion of the procedure.  No phone refills. No medications will be "called into the pharmacy".  No Fax refills.  No weekend refills.  No Holliday refills.  No after hours  refills.  Remember:  Business hours are:  Monday to Thursday 8:00 AM to 4:00 PM Provider's Schedule: Crystal King, NP - Appointments are:  Medication management: Monday to Thursday 8:00 AM to 4:00 PM Puneet Selden, MD - Appointments are:  Medication management: Monday and Wednesday 8:00 AM to 4:00 PM Procedure day: Tuesday and Thursday 7:30 AM to 4:00 PM Bilal Lateef, MD - Appointments are:  Medication management: Tuesday and Thursday 8:00 AM to 4:00 PM Procedure day: Monday and Wednesday 7:30 AM to 4:00 PM (Last update: 10/11/2017) ____________________________________________________________________________________________   ____________________________________________________________________________________________  CANNABIDIOL (AKA: CBD Oil or Pills)  Applies to: All patients receiving prescriptions of controlled substances (written and/or electronic).  General Information: Cannabidiol (CBD) was discovered in 1940. It is one of some 113 identified cannabinoids in cannabis (Marijuana) plants, accounting for up to 40% of the plant's extract. As of 2018, preliminary clinical research on cannabidiol included studies of anxiety, cognition, movement disorders, and pain.  Cannabidiol is consummed in multiple ways, including inhalation of cannabis smoke or vapor, as an aerosol spray into the cheek, and by mouth. It may be supplied as CBD oil containing CBD as the active ingredient (no added tetrahydrocannabinol (THC) or terpenes), a full-plant CBD-dominant hemp extract oil, capsules, dried cannabis, or as a liquid solution. CBD is thought not have the same psychoactivity as THC, and may affect the actions of THC. Studies suggest that CBD may interact with different biological targets, including cannabinoid receptors and other neurotransmitter receptors. As of 2018 the mechanism of action for its biological effects has not been determined.  In the [place], cannabidiol has a limited  approval by the Food and Drug Administration (FDA) for treatment of only two types   of epilepsy disorders. The side effects of long-term use of the drug include somnolence, decreased appetite, diarrhea, fatigue, malaise, weakness, sleeping problems, and others.  CBD remains a Schedule I drug prohibited for any use.  Legality: Some manufacturers ship CBD products nationally, an illegal action which the FDA has not enforced in 2018, with CBD remaining the subject of an FDA investigational new drug evaluation, and is not considered legal as a dietary supplement or food ingredient as of December 2018. Federal illegality has made it difficult historically to conduct research on CBD. CBD is openly sold in head shops and health food stores in some states where such sales have not been explicitly legalized.  Warning: Because it is not FDA approved for general use or treatment of pain, it is not required to undergo the same manufacturing controls as prescription drugs.  This means that the available cannabidiol (CBD) may be contaminated with THC.  If this is the case, it will trigger a positive urine drug screen (UDS) test for cannabinoids (Marijuana).  Because a positive UDS for illicit substances is a violation of our medication agreement, your opioid analgesics (pain medicine) may be permanently discontinued. (Last update: 11/01/2017) ____________________________________________________________________________________________    

## 2019-02-01 ENCOUNTER — Other Ambulatory Visit: Payer: Self-pay | Admitting: Cardiovascular Disease

## 2019-02-03 ENCOUNTER — Encounter: Payer: Medicare HMO | Admitting: Nurse Practitioner

## 2019-02-03 ENCOUNTER — Ambulatory Visit: Payer: Medicare HMO | Admitting: Pain Medicine

## 2019-02-11 ENCOUNTER — Encounter: Payer: Medicare HMO | Attending: Family Medicine | Admitting: Dietician

## 2019-02-11 ENCOUNTER — Encounter: Payer: Self-pay | Admitting: Dietician

## 2019-02-11 ENCOUNTER — Other Ambulatory Visit: Payer: Self-pay

## 2019-02-11 DIAGNOSIS — Z6841 Body Mass Index (BMI) 40.0 and over, adult: Secondary | ICD-10-CM | POA: Diagnosis not present

## 2019-02-11 DIAGNOSIS — M545 Low back pain: Secondary | ICD-10-CM | POA: Insufficient documentation

## 2019-02-11 DIAGNOSIS — M150 Primary generalized (osteo)arthritis: Secondary | ICD-10-CM | POA: Diagnosis not present

## 2019-02-11 DIAGNOSIS — G4730 Sleep apnea, unspecified: Secondary | ICD-10-CM | POA: Diagnosis not present

## 2019-02-11 DIAGNOSIS — I10 Essential (primary) hypertension: Secondary | ICD-10-CM | POA: Insufficient documentation

## 2019-02-11 DIAGNOSIS — G8929 Other chronic pain: Secondary | ICD-10-CM | POA: Diagnosis not present

## 2019-02-11 DIAGNOSIS — Z713 Dietary counseling and surveillance: Secondary | ICD-10-CM | POA: Diagnosis not present

## 2019-02-11 NOTE — Patient Instructions (Addendum)
   Keep up with exercise you do, more moving in the day leads to more calorie burning and increased metabolism.   Great job eating vegetables and fruits, continue to eat plenty of these every day.   Make sure to eat a good variety of foods and all food groups with meals so your nutrition is covered with less eating times during the day; include lettuce and tomato or other vegetables when you eat a sandwich.   Include an afternoon snack daily with fruit and crushed nuts or 1-2Tablespoons peanut butter; or low-sugar yogurt; or graham cracker with peanut butter, or fruit and lowfat cheese

## 2019-02-11 NOTE — Progress Notes (Signed)
Medical Nutrition Therapy: Visit start time: 1100  end time: 1140  Assessment:  Diagnosis: obesity Medical history changes: none per patient Psychosocial issues/ stress concerns: none   Current weight: 258.2lbs Height: 5'3" Medications, supplements: reconciled list in medical record  Progress and evaluation:   Weight loss of 2.7lbs since 12/31/18.   Patient reports continued restriction of sweets and sodas, trying to avoid chips.   She is cooking with lower-cal methods and incorporating vegetables regularly.    She feels vitamin D3 and magnesium supplements have improved energy level.   She is now following an intermittent fasting pattern, eating between hours of 1 and 8pm.   Physical activity: yardwork including mowing and leaf-blowing, walking in [place], to store from home  Dietary Intake:  Usual eating pattern includes 2 meals and 0-1 snacks per day. Dining out frequency: 0-1 meals per week.  Breakfast: none Snack: none Lunch: sandwich, cantaloupe or watermelon; leftovers from previous dinner Snack: fruit or sometimes [place] vanilla sm scoop on cone Supper: 7-8pm -- lean meats incl chicken and [place] often + veg ie corn, brussels sprouts, fresh green beans, mixed greens, occ carrots if etc.  Snack: none Beverages: water at least 4 bottles daily; light cran-apple juice; some Pepsi, 2-liter x4 days  Nutrition Care Education: Topics covered: weight control Basic nutrition: basic food groups, appropriate nutrient balance, appropriate meal and snack schedule    Weight control: reviewed progress since previous visit; meal timing and effects on metabolism, intermittent fasting and limited variety of foods/ nutrients when eating infrequently; reviewed role of physical activity    Nutritional Diagnosis:  [place]-3.3 Overweight/obesity As related to history of excess calories, hypothyroidism, inadequate activity.  As evidenced by patient with current BMI of 46, working on diet and  lifestyle changes for ongoing weight loss.  Intervention:   Instruction and discussion as noted above.  Updated goals for ongoing weight loss.   Advised inclusion of mid-afternoon snack to promote adequate nutrition, and avoidance of ignoring physical hunger symptoms on a regular basis.   Patient requested next follow-up in 2 months.  Education Materials given:  Marland Kitchen Goals/ instructions   Learner/ who was taught:  . Patient   Level of understanding: Marland Kitchen Verbalizes/ demonstrates competency   Demonstrated degree of understanding via:   Teach back Learning barriers: . None  Willingness to learn/ readiness for change: . Eager, change in progress   Monitoring and Evaluation:  Dietary intake, exercise, and body weight      follow up: 04/22/19

## 2019-02-14 ENCOUNTER — Other Ambulatory Visit: Payer: Self-pay | Admitting: Family Medicine

## 2019-02-14 DIAGNOSIS — I10 Essential (primary) hypertension: Secondary | ICD-10-CM

## 2019-02-17 ENCOUNTER — Other Ambulatory Visit: Payer: Self-pay

## 2019-02-17 MED ORDER — FLUTICASONE PROPIONATE 50 MCG/ACT NA SUSP: 2.0000 | Freq: Every day | NASAL | 6 refills | Status: DC

## 2019-03-03 ENCOUNTER — Ambulatory Visit (INDEPENDENT_AMBULATORY_CARE_PROVIDER_SITE_OTHER): Payer: Medicare HMO | Admitting: Podiatry

## 2019-03-03 ENCOUNTER — Encounter: Payer: Self-pay | Admitting: Podiatry

## 2019-03-03 ENCOUNTER — Other Ambulatory Visit: Payer: Self-pay | Admitting: Family Medicine

## 2019-03-03 ENCOUNTER — Other Ambulatory Visit: Payer: Self-pay

## 2019-03-03 ENCOUNTER — Telehealth: Payer: Self-pay | Admitting: Family Medicine

## 2019-03-03 DIAGNOSIS — B351 Tinea unguium: Secondary | ICD-10-CM

## 2019-03-03 DIAGNOSIS — M79674 Pain in right toe(s): Secondary | ICD-10-CM | POA: Diagnosis not present

## 2019-03-03 DIAGNOSIS — L608 Other nail disorders: Secondary | ICD-10-CM

## 2019-03-03 DIAGNOSIS — M79675 Pain in left toe(s): Secondary | ICD-10-CM

## 2019-03-03 NOTE — Progress Notes (Signed)
Complaint:  Visit Type: Patient returns to my office for continued preventative foot care services. Complaint: Patient states" my nails have grown long and thick and become painful to walk and wear shoes" Patient has been diagnosed with neuropathy for which she takes gabapentin.. The patient presents for preventative foot care services. No changes to ROS  Podiatric Exam: Vascular: dorsalis pedis and posterior tibial pulses are palpable bilateral. Capillary return is immediate. Temperature gradient is WNL. Skin turgor WNL  Sensorium: Normal Semmes Weinstein monofilament test. Normal tactile sensation bilaterally. Nail Exam: Pt has thick disfigured discolored nails with subungual debris noted bilateral entire nail hallux through fifth toenails Ulcer Exam: There is no evidence of ulcer or pre-ulcerative changes or infection. Orthopedic Exam: Muscle tone and strength are WNL. No limitations in general ROM. No crepitus or effusions noted. Foot type and digits show no abnormalities. Bony prominences are unremarkable. Skin: No Porokeratosis. No infection or ulcers  Diagnosis:  Onychomycosis, , Pain in right toe, pain in left toes  Treatment & Plan Procedures and Treatment: Consent by patient was obtained for treatment procedures.   Debridement of mycotic and hypertrophic toenails, 1 through 5 bilateral and clearing of subungual debris. No ulceration, no infection noted.  Return Visit-Office Procedure: Patient instructed to return to the office for a follow up visit 3 months for continued evaluation and treatment.    Gardiner Barefoot DPM

## 2019-03-03 NOTE — Telephone Encounter (Signed)
Pt called she has appointment today to get her nails trimmed @ [facility] and needs referral for insurance purposes  Best number [phone]

## 2019-03-03 NOTE — Telephone Encounter (Signed)
Referral placed.

## 2019-03-04 ENCOUNTER — Other Ambulatory Visit: Payer: Self-pay | Admitting: Family Medicine

## 2019-03-04 DIAGNOSIS — R7302 Impaired glucose tolerance (oral): Secondary | ICD-10-CM

## 2019-03-04 DIAGNOSIS — E559 Vitamin D deficiency, unspecified: Secondary | ICD-10-CM

## 2019-03-04 DIAGNOSIS — E7800 Pure hypercholesterolemia, unspecified: Secondary | ICD-10-CM

## 2019-03-04 DIAGNOSIS — E039 Hypothyroidism, unspecified: Secondary | ICD-10-CM

## 2019-03-06 ENCOUNTER — Other Ambulatory Visit (INDEPENDENT_AMBULATORY_CARE_PROVIDER_SITE_OTHER): Payer: Medicare HMO

## 2019-03-06 ENCOUNTER — Other Ambulatory Visit: Payer: Self-pay | Admitting: Pain Medicine

## 2019-03-06 DIAGNOSIS — M792 Neuralgia and neuritis, unspecified: Secondary | ICD-10-CM

## 2019-03-06 DIAGNOSIS — E039 Hypothyroidism, unspecified: Secondary | ICD-10-CM | POA: Diagnosis not present

## 2019-03-06 DIAGNOSIS — E1165 Type 2 diabetes mellitus with hyperglycemia: Secondary | ICD-10-CM

## 2019-03-06 DIAGNOSIS — R7302 Impaired glucose tolerance (oral): Secondary | ICD-10-CM | POA: Diagnosis not present

## 2019-03-06 DIAGNOSIS — E7800 Pure hypercholesterolemia, unspecified: Secondary | ICD-10-CM

## 2019-03-06 DIAGNOSIS — E559 Vitamin D deficiency, unspecified: Secondary | ICD-10-CM

## 2019-03-06 LAB — COMPREHENSIVE METABOLIC PANEL
ALT: 107 U/L — ABNORMAL HIGH (ref 0–35)
AST: 113 U/L — ABNORMAL HIGH (ref 0–37)
Albumin: 4.4 g/dL (ref 3.5–5.2)
Alkaline Phosphatase: 76 U/L (ref 39–117)
BUN: 10 mg/dL (ref 6–23)
CO2: 31 mEq/L (ref 19–32)
Calcium: 10.1 mg/dL (ref 8.4–10.5)
Chloride: 96 mEq/L (ref 96–112)
Creatinine, Ser: 0.86 mg/dL (ref 0.40–1.20)
GFR: 83.65 mL/min (ref >60.00)
Glucose, Bld: 247 mg/dL — ABNORMAL HIGH (ref 70–99)
Potassium: 3.6 mEq/L (ref 3.5–5.1)
Sodium: 135 mEq/L (ref 135–145)
Total Bilirubin: 0.6 mg/dL (ref 0.2–1.2)
Total Protein: 7.9 g/dL (ref 6.0–8.3)

## 2019-03-06 LAB — MICROALBUMIN / CREATININE URINE RATIO
Creatinine,U: 127.1 mg/dL
Microalb Creat Ratio: 1.4 mg/g (ref 0.0–30.0)
Microalb, Ur: 1.8 mg/dL (ref 0.0–1.9)

## 2019-03-06 LAB — TSH: TSH: 6.6 u[IU]/mL — ABNORMAL HIGH (ref 0.35–4.50)

## 2019-03-06 LAB — LIPID PANEL
Cholesterol: 229 mg/dL — ABNORMAL HIGH (ref 0–200)
HDL: 45.2 mg/dL (ref >39.00)
LDL Cholesterol: 155 mg/dL — ABNORMAL HIGH (ref 0–99)
NonHDL: 183.81
Total CHOL/HDL Ratio: 5
Triglycerides: 146 mg/dL (ref 0.0–149.0)
VLDL: 29.2 mg/dL (ref 0.0–40.0)

## 2019-03-06 LAB — HEMOGLOBIN A1C: Hgb A1c MFr Bld: 11.7 % — ABNORMAL HIGH (ref 4.6–6.5)

## 2019-03-06 LAB — VITAMIN D 25 HYDROXY (VIT D DEFICIENCY, FRACTURES): VITD: 34.76 ng/mL (ref 30.00–100.00)

## 2019-03-07 MED ORDER — METFORMIN HCL 500 MG PO TABS: 500.0000 mg | ORAL_TABLET | Freq: Two times a day (BID) | ORAL | 3 refills | Status: DC

## 2019-03-07 MED ORDER — BLOOD GLUCOSE METER KIT: PACK | 0 refills | Status: AC

## 2019-03-07 MED ORDER — LEVOTHYROXINE SODIUM 125 MCG PO TABS: 125.0000 ug | ORAL_TABLET | Freq: Every day | ORAL | 1 refills | Status: DC

## 2019-03-07 NOTE — Addendum Note (Signed)
Addended by: Clarene Reamer B on: 03/07/2019 08:27 AM   Modules accepted: Orders

## 2019-03-07 NOTE — Progress Notes (Signed)
Elevated hgba1c, metformin and glucometer/supplies sent to patient's pharmacy.

## 2019-03-11 ENCOUNTER — Telehealth: Payer: Self-pay | Admitting: *Deleted

## 2019-03-12 NOTE — Telephone Encounter (Signed)
Patient advised to purchase Vitamin D OTC.

## 2019-03-24 ENCOUNTER — Other Ambulatory Visit: Payer: Self-pay | Admitting: Family Medicine

## 2019-03-28 ENCOUNTER — Ambulatory Visit (INDEPENDENT_AMBULATORY_CARE_PROVIDER_SITE_OTHER): Payer: Medicare HMO | Admitting: Family Medicine

## 2019-03-28 ENCOUNTER — Encounter: Payer: Self-pay | Admitting: Family Medicine

## 2019-03-28 DIAGNOSIS — E119 Type 2 diabetes mellitus without complications: Secondary | ICD-10-CM | POA: Diagnosis not present

## 2019-03-28 DIAGNOSIS — Z0000 Encounter for general adult medical examination without abnormal findings: Secondary | ICD-10-CM

## 2019-03-28 DIAGNOSIS — Z1239 Encounter for other screening for malignant neoplasm of breast: Secondary | ICD-10-CM

## 2019-03-28 DIAGNOSIS — J3489 Other specified disorders of nose and nasal sinuses: Secondary | ICD-10-CM | POA: Diagnosis not present

## 2019-03-28 DIAGNOSIS — E559 Vitamin D deficiency, unspecified: Secondary | ICD-10-CM | POA: Diagnosis not present

## 2019-03-28 NOTE — Progress Notes (Signed)
Virtual Visit via Video Note  I connected with Teresa Mcgee on 03/28/19 at 11:00 AM EDT by a video enabled telemedicine application and verified that I am speaking with the correct person using two identifiers.  Location: Patient: In her home Provider: [place]   I discussed the limitations of evaluation and management by telemedicine and the availability of in person appointments. The patient expressed understanding and agreed to proceed.  History of Present Illness: This is a 53 yo female who requests virtual visit for annual exam. She reports that she has been doing ok during pandemic.  Last CPE- 11/26/2017 Mammo- over due Pap- total hysterectomy Colonoscopy- 2008 Tdap- 0/06/2011 Flu- some years Eye- upcoming in 3 days, I have asked her to have records sent to me Dental- endentulous Exercise- limited by chronic back pain.   Last month with uncontrolled DM, hemoglobin 11.7/ blood glucose 247. Prior hgba1c 05/29/18 was 7.2. She was started in metformin 500 mg bid. Is having 3 episodes of loose stool daily since starting metformin. No bowel incontinence. She has been checking her blood sugars and they are running 120-142. She is seeing nutrition and has an upcoming appointment.   Past Medical History:  Diagnosis Date  . Arthritis   . Bell's palsy may 2012  . Chest pain   . GERD (gastroesophageal reflux disease)   . H/O hiatal hernia   . Hyperlipidemia   . Hypertension   . Hypothyroidism   . Shingles   . Shortness of breath   . Sleep apnea    uses cpap   Past Surgical History:  Procedure Laterality Date  . CESAREAN SECTION    . cesarian     3x  . PARTIAL HYSTERECTOMY  2000   abnormal uterine bleeding  . THYROIDECTOMY  1998  . TOOTH EXTRACTION    . TUBAL LIGATION     Family History  Problem Relation Age of Onset  . Heart attack Father 63       deceased  . Diabetes Brother   . Stroke Brother 25       twice  . Stroke Sister 87  . Diabetes Sister    . Heart attack Sister 92  . Heart attack Brother 73   Social History   Tobacco Use  . Smoking status: Current Every Day Smoker    Packs/day: 0.20    Years: 20.00    Pack years: 4.00    Types: Cigarettes    Last attempt to quit: 04/29/2018    Years since quitting: 0.9  . Smokeless tobacco: Never Used  . Tobacco comment: used to smoke 1.5 - 2 PPD, now down to 3-4 cigs a day  Substance Use Topics  . Alcohol use: No  . Drug use: No     Review of Systems  Constitutional: Negative.   HENT:       Sores in her nose with nasal pillow CPAP, no bleeding.   Eyes: Positive for blurred vision (has upcoming eye appointment in 3 days).  Respiratory: Negative.   Cardiovascular: Negative.   Gastrointestinal: Positive for diarrhea.  Genitourinary: Negative.   Musculoskeletal: Positive for back pain.  Skin: Negative.   Neurological: Positive for headaches (improved, rare). Negative for dizziness and weakness.  Endo/Heme/Allergies: Positive for polydipsia (has improved on metformin).  Psychiatric/Behavioral: Negative.     Observations/Objective: Physical Exam  Constitutional: She is oriented to person, place, and time and well-developed, well-nourished, and in no distress.  HENT:  Head: Normocephalic and atraumatic.  Eyes:  Conjunctivae are normal.  Pulmonary/Chest: Effort normal.  Neurological: She is alert and oriented to person, place, and time.  Psychiatric: Mood, memory, affect and judgment normal.    There were no vitals taken for this visit. Wt Readings from Last 3 Encounters:  02/11/19 258 lb 3.2 oz (117.1 kg)  12/31/18 260 lb 14.4 oz (118.3 kg)  11/12/18 265 lb 12.8 oz (120.6 kg)   BP Readings from Last 3 Encounters:  03/03/19 (!) 144/82  09/16/18 124/60  08/21/18 122/84   Depression screen PHQ 2/9 03/28/2019 11/12/2018 09/16/2018 08/19/2018 07/23/2018  Decreased Interest 0 0 0 0 0  Down, Depressed, Hopeless 0 0 0 0 0  PHQ - 2 Score 0 0 0 0 0    Assessment and Plan: 1.  Annual physical exam - Discussed and encouraged healthy lifestyle choices- adequate sleep, regular exercise, stress management and healthy food choices.  - orders placed for mammo, will discuss screening colonoscopy at next visit  2. Vitamin D deficiency - continue vitamin D3 5,000 iu daily  3. Type 2 diabetes mellitus without complication, without long-term current use of insulin (HCC) - continue medication, discussed using immodium for diarrhea, decreasing fatty foods, increasing fiber - follow up with nutrition as scheduled  4. Sore in nose - discussed using nasal saline spray several times a day - follow up if no improvement  - follow up in 6 weeks  Clarene Reamer, FNP-BC   Primary Care at [facility], [place]  03/30/2019 6:24 PM   Follow Up Instructions: AVS mailed to patient's home as she does not have Mychart access.    I discussed the assessment and treatment plan with the patient. The patient was provided an opportunity to ask questions and all were answered. The patient agreed with the plan and demonstrated an understanding of the instructions.   The patient was advised to call back or seek an in-person evaluation if the symptoms worsen or if the condition fails to improve as anticipated.   Elby Beck, FNP

## 2019-03-30 ENCOUNTER — Encounter: Payer: Self-pay | Admitting: Family Medicine

## 2019-03-30 DIAGNOSIS — Z7985 Long-term (current) use of injectable non-insulin antidiabetic drugs: Secondary | ICD-10-CM | POA: Insufficient documentation

## 2019-03-30 DIAGNOSIS — E119 Type 2 diabetes mellitus without complications: Secondary | ICD-10-CM | POA: Insufficient documentation

## 2019-03-30 NOTE — Patient Instructions (Addendum)
Hi Teresa Mcgee,  It was good to see you for your recent video visit.   It sounds like your blood sugars are coming down. Follow up with nutrition as scheduled.   For the diarrhea that comes with the metformin, decrease the fatty foods in your diet, you can take 1/2 to 1 immodium daily as needed.   I have put in an order for you to have a screening mammogram, you will get a call about scheduling this.   Please schedule a follow up appointment for for the end of October (after the 23rd) so we can recheck your blood sugar. Continue to check your blood sugar at home and let me know if you are getting readings over 200.   Take care,  Jackelyn Poling

## 2019-03-31 NOTE — Progress Notes (Signed)
AVS mailed to patient as instructed by D. Carlean Purl.

## 2019-04-04 ENCOUNTER — Other Ambulatory Visit: Payer: Self-pay | Admitting: Family Medicine

## 2019-04-07 ENCOUNTER — Ambulatory Visit: Payer: Medicare HMO | Admitting: Pain Medicine

## 2019-04-10 ENCOUNTER — Encounter: Payer: Self-pay | Admitting: Pain Medicine

## 2019-04-13 NOTE — Patient Instructions (Signed)
____________________________________________________________________________________________  Medication Recommendations and Reminders  Applies to: All patients receiving prescriptions (written and/or electronic).  Medication Rules & Regulations: These rules and regulations exist for your safety and that of others. They are not flexible and neither are we. Dismissing or ignoring them will be considered "non-compliance" with medication therapy, resulting in complete and irreversible termination of such therapy. (See document titled "Medication Rules" for more details.) In all conscience, because of safety reasons, we cannot continue providing a therapy where the patient does not follow instructions.  Pharmacy of record:   Definition: This is the pharmacy where your electronic prescriptions will be sent.   We do not endorse any particular pharmacy.  You are not restricted in your choice of pharmacy.  The pharmacy listed in the electronic medical record should be the one where you want electronic prescriptions to be sent.  If you choose to change pharmacy, simply notify our nursing staff of your choice of new pharmacy.  Recommendations:  Keep all of your pain medications in a safe place, under lock and key, even if you live alone.   After you fill your prescription, take 1 week's worth of pills and put them away in a safe place. You should keep a separate, properly labeled bottle for this purpose. The remainder should be kept in the original bottle. Use this as your primary supply, until it runs out. Once it's gone, then you know that you have 1 week's worth of medicine, and it is time to come in for a prescription refill. If you do this correctly, it is unlikely that you will ever run out of medicine.  To make sure that the above recommendation works, it is very important that you make sure your medication refill appointments are scheduled at least 1 week before you run out of medicine. To do  this in an effective manner, make sure that you do not leave the office without scheduling your next medication management appointment. Always ask the nursing staff to show you in your prescription , when your medication will be running out. Then arrange for the receptionist to get you a return appointment, at least 7 days before you run out of medicine. Do not wait until you have 1 or 2 pills left, to come in. This is very poor planning and does not take into consideration that we may need to cancel appointments due to bad weather, sickness, or emergencies affecting our staff.  "Partial Fill": If for any reason your pharmacy does not have enough pills/tablets to completely fill or refill your prescription, do not allow for a "partial fill". You will need a separate prescription to fill the remaining amount, which we will not provide. If the reason for the partial fill is your insurance, you will need to talk to the pharmacist about payment alternatives for the remaining tablets, but again, do not accept a partial fill.  Prescription refills and/or changes in medication(s):   Prescription refills, and/or changes in dose or medication, will be conducted only during scheduled medication management appointments. (Applies to both, written and electronic prescriptions.)  No refills on procedure days. No medication will be changed or started on procedure days. No changes, adjustments, and/or refills will be conducted on a procedure day. Doing so will interfere with the diagnostic portion of the procedure.  No phone refills. No medications will be "called into the pharmacy".  No Fax refills.  No weekend refills.  No Holliday refills.  No after hours refills.  Remember:  Business   hours are:  Monday to Thursday 8:00 AM to 4:00 PM Provider's Schedule: Kytzia Gienger, MD - Appointments are:  Medication management: Monday and Wednesday 8:00 AM to 4:00 PM Procedure day: Tuesday and Thursday 7:30 AM  to 4:00 PM Bilal Lateef, MD - Appointments are:  Medication management: Tuesday and Thursday 8:00 AM to 4:00 PM Procedure day: Monday and Wednesday 7:30 AM to 4:00 PM (Last update: 10/11/2017) ____________________________________________________________________________________________    

## 2019-04-13 NOTE — Progress Notes (Signed)
Pain Management Virtual Encounter Note - Virtual Visit via Telephone Telehealth (real-time audio visits between healthcare provider and patient).   Patient's Phone No. & Preferred Pharmacy:  [phone] (home); [phone] (mobile); (Preferred) [phone] No e-mail address on record  [id number] - [place], [address] - [address] [place] [address] Phone: [phone] Fax: [phone]    Pre-screening note:  Our staff contacted Teresa Mcgee and offered her an "in person", "face-to-face" appointment versus a telephone encounter. She indicated preferring the telephone encounter, at this time.   Reason for Virtual Visit: COVID-19*  Social distancing based on CDC and AMA recommendations.   I contacted Teresa Mcgee on 04/14/2019 via telephone.      I clearly identified myself as Teresa A Naveira, MD. I verified that I was speaking with the correct person using two identifiers (Name: Teresa Mcgee, and date of birth: 05/25/1966).  Advanced Informed Consent I sought verbal advanced consent from Teresa Mcgee for virtual visit interactions. I informed Teresa Mcgee of possible security and privacy concerns, risks, and limitations associated with providing "not-in-person" medical evaluation and management services. I also informed Teresa Mcgee of the availability of "in-person" appointments. Finally, I informed her that there would be a charge for the virtual visit and that she could be  personally, fully or partially, financially responsible for it. Teresa Mcgee expressed understanding and agreed to proceed.   Historic Elements   Teresa Mcgee is a 52 y.o. year old, female patient evaluated today after her last encounter by our practice on 03/11/2019. Teresa Mcgee  has a past medical history of Arthritis, Bell's palsy (may 2012), Chest pain, GERD (gastroesophageal reflux disease), H/O hiatal hernia, Hyperlipidemia, Hypertension, Hypothyroidism, Shingles,  Shortness of breath, and Sleep apnea. She also  has a past surgical history that includes Thyroidectomy (1998); cesarian; Partial hysterectomy (2000); Cesarean section; Tubal ligation; and Tooth extraction. Ms. Montini has a current medication list which includes the following prescription(s): accu-chek fastclix lancets, accu-chek guide, albuterol, amlodipine, azelastine, blood glucose meter kit and supplies, [facility] d3, ezetimibe, fluticasone, gabapentin, hydrochlorothiazide, levocetirizine, levothyroxine, magnesium, meloxicam, metformin, oxycodone, oxycodone, oxycodone, refresh celluvisc, and tizanidine. She  reports that she has been smoking cigarettes. She has a 4.00 pack-year smoking history. She has never used smokeless tobacco. She reports that she does not drink alcohol or use drugs. Teresa Mcgee has No Known Allergies.   HPI  Today, she is being contacted for medication management.  Pharmacotherapy Assessment  Analgesic: OxycodoneIR5 mg, 1 tab PO q 8 hrs (PRN) (15 mg/day of oxycodone) MME/day:22.5mg/day.   Monitoring: Pharmacotherapy: No side-effects or adverse reactions reported. [place] PMP: PDMP reviewed during this encounter.       Compliance: No problems identified. Effectiveness: Clinically acceptable. Plan: Refer to "POC".  UDS:  Summary  Date Value Ref Range Status  09/16/2018 FINAL  Final    Comment:    ==================================================================== TOXASSURE SELECT 13 (MW) ==================================================================== Test                             Result       Flag       Units Drug Present and Declared for Prescription Verification   Oxycodone                      218          EXPECTED   ng/mg creat   Oxymorphone                      388          EXPECTED   ng/mg creat   Noroxycodone                   239          EXPECTED   ng/mg creat   Noroxymorphone                 64           EXPECTED   ng/mg creat    Sources of oxycodone  are scheduled prescription medications.    Oxymorphone, noroxycodone, and noroxymorphone are expected    metabolites of oxycodone. Oxymorphone is also available as a    scheduled prescription medication. ==================================================================== Test                      Result    Flag   Units      Ref Range   Creatinine              114              mg/dL      >=20 ==================================================================== Declared Medications:  The flagging and interpretation on this report are based on the  following declared medications.  Unexpected results may arise from  inaccuracies in the declared medications.  **Note: The testing scope of this panel includes these medications:  Oxycodone  **Note: The testing scope of this panel does not include following  reported medications:  Albuterol  Amlodipine (Norvasc)  Calcium  Cetirizine (Zyrtec)  Eye Drop (Refresh)  Ezetimibe (Zetia)  Fluticasone (Flonase)  Gabapentin  Hydrochlorothiazide  Levothyroxine  Meloxicam (Mobic)  Tizanidine  Vitamin D3 ==================================================================== For clinical consultation, please call [phone]. ====================================================================    Laboratory Chemistry Profile (12 mo)  Renal: 03/06/2019: BUN 10; Creatinine, Ser 0.86  Lab Results  Component Value Date   GFRAA >60 05/25/2018   GFRNONAA >60 05/25/2018   Hepatic: 03/06/2019: Albumin 4.4 Lab Results  Component Value Date   AST 113 (H) 03/06/2019   ALT 107 (H) 03/06/2019   Other: 03/06/2019: VITD 34.76 Note: Above Lab results reviewed.  Imaging  Last 90 days:  No results found.  Assessment  The primary encounter diagnosis was Chronic pain syndrome. Diagnoses of Chronic low back pain (Primary Area of Pain) (Bilateral) (L>R) w/o sciatica, Chronic lower extremity pain ( Secondary  Area of  Pain) (Bilateral) (L>R), Chronic neck  pain (Tertiary Area of Pain) (Bilateral) (L>R), Cervicalgia, Chronic upper extremity pain (Fourth Area of Pain) (Bilateral) (L>R), Chronic musculoskeletal pain, Muscle spasms of neck, Neurogenic pain, Osteoarthritis involving multiple joints, Vitamin D deficiency, and Gastroesophageal reflux disease without esophagitis were also pertinent to this visit.  Plan of Care  I have discontinued Teresa Mcgee's oxyCODONE and oxyCODONE. I have also changed her gabapentin and oxyCODONE. Additionally, I am having her start on oxyCODONE and oxyCODONE. Lastly, I am having her maintain her Refresh Celluvisc, [facility] D3, albuterol, azelastine, levocetirizine, amLODipine, ezetimibe, hydrochlorothiazide, fluticasone, metFORMIN, levothyroxine, blood glucose meter kit and supplies, Accu-Chek Guide, Accu-Chek FastClix Lancets, tiZANidine, meloxicam, and Magnesium.  Pharmacotherapy (Medications Ordered): Meds ordered this encounter  Medications  . tiZANidine (ZANAFLEX) 4 MG tablet    Sig: Take 1 tablet (4 mg total) by mouth every 8 (eight) hours as needed for muscle spasms.    Dispense:  90 tablet    Refill:  2    Fill one day early if pharmacy is closed on scheduled refill date. May  substitute for generic if available.  . gabapentin (NEURONTIN) 100 MG capsule    Sig: Take 1 capsule (100 mg total) by mouth 2 (two) times daily AND 3 capsules (300 mg total) at bedtime.    Dispense:  150 capsule    Refill:  2    Fill one day early if pharmacy is closed on scheduled refill date. May substitute for generic if available.  . meloxicam (MOBIC) 15 MG tablet    Sig: Take 1 tablet (15 mg total) by mouth daily.    Dispense:  30 tablet    Refill:  2    Fill one day early if pharmacy is closed on scheduled refill date. May substitute for generic if available.  . Magnesium 500 MG CAPS    Sig: Take 1 capsule (500 mg total) by mouth 2 (two) times daily at 8 am and 10 pm.    Dispense:  60 capsule    Refill:  2    Fill one day  early if pharmacy is closed on scheduled refill date. May substitute for generic if available.  . oxyCODONE (OXY IR/ROXICODONE) 5 MG immediate release tablet    Sig: Take 1 tablet (5 mg total) by mouth every 8 (eight) hours as needed for severe pain. Must last 30 days    Dispense:  90 tablet    Refill:  0    Chronic Pain: STOP Act (Not applicable) Fill 1 day early if closed on refill date. Do not fill until: 04/20/2019. To last until: 05/20/2019. Avoid benzodiazepines within 8 hours of opioids  . oxyCODONE (OXY IR/ROXICODONE) 5 MG immediate release tablet    Sig: Take 1 tablet (5 mg total) by mouth every 8 (eight) hours as needed for severe pain. Must last 30 days    Dispense:  90 tablet    Refill:  0    Chronic Pain: STOP Act (Not applicable) Fill 1 day early if closed on refill date. Do not fill until: 05/20/2019. To last until: 06/19/2019. Avoid benzodiazepines within 8 hours of opioids  . oxyCODONE (OXY IR/ROXICODONE) 5 MG immediate release tablet    Sig: Take 1 tablet (5 mg total) by mouth every 8 (eight) hours as needed for severe pain. Must last 30 days    Dispense:  90 tablet    Refill:  0    Chronic Pain: STOP Act (Not applicable) Fill 1 day early if closed on refill date. Do not fill until: 06/19/2019. To last until: 07/19/2019. Avoid benzodiazepines within 8 hours of opioids   Orders:  No orders of the defined types were placed in this encounter.  Follow-up plan:   Return in about 3 months (around 07/16/2019) for (VV), E/M, (MM).      Interventional management options:  Considering: NOTE: NO RFA until BMI <35  Possible bilateral lumbar facet RFA Diagnosticleft-sided sacroiliac joint block Possible left-sided sacroiliac joint RFA Diagnostic bilateral IA hip joint injection Diagnostic bilateral femoral nerve + obturator NB Possible bilateral femoral nerve + obturator nerve RFA Diagnostic left-sided LESI Diagnostic bilateral transforaminal ESI Diagnostic left-sided  CESI Diagnostic bilateral cervical facet block Possible bilateral cervical facet RFA Diagnostic bilateralIAknee joint injectionswith local anesthetic and steroid Possible series of 5 bilateral intra-articular Hyalgan knee injections Diagnostic bilateral genicular nerve blocks Possible bilateral genicular nerve RFA Diagnostic trigger point injections   Palliative PRN treatment(s): Palliativebilateral lumbar facet block    Recent Visits Date Type Provider Dept  01/20/19 Office Visit Mcgee, Francisco, MD [facility]  Showing recent visits   within past 90 days and meeting all other requirements   Today's Visits Date Type Provider Dept  04/14/19 Office Visit Milinda Pointer, MD [facility]  Showing today's visits and meeting all other requirements   Future Appointments No visits were found meeting these conditions.  Showing future appointments within next 90 days and meeting all other requirements   I discussed the assessment and treatment plan with the patient. The patient was provided an opportunity to ask questions and all were answered. The patient agreed with the plan and demonstrated an understanding of the instructions.  Patient advised to call back or seek an in-person evaluation if the symptoms or condition worsens.  Total duration of non-face-to-face encounter: 12 minutes.  Note by: Gaspar Cola, MD Date: 04/14/2019; Time: 12:08 PM  Note: This dictation was prepared with Dragon dictation. Any transcriptional errors that may result from this process are unintentional.  Disclaimer:  * Given the special circumstances of the COVID-19 pandemic, the federal government has announced that the Office for Civil Rights (OCR) will exercise its enforcement discretion and will not impose penalties on physicians using telehealth in the event of noncompliance with regulatory requirements under the [place] (HIPAA) in connection with the good faith provision of telehealth during the FTDDU-20 national public health emergency. ([place])

## 2019-04-14 ENCOUNTER — Other Ambulatory Visit: Payer: Self-pay

## 2019-04-14 ENCOUNTER — Other Ambulatory Visit: Payer: Self-pay | Admitting: Pain Medicine

## 2019-04-14 ENCOUNTER — Ambulatory Visit: Payer: Medicare HMO | Attending: Pain Medicine | Admitting: Pain Medicine

## 2019-04-14 DIAGNOSIS — M79604 Pain in right leg: Secondary | ICD-10-CM

## 2019-04-14 DIAGNOSIS — G894 Chronic pain syndrome: Secondary | ICD-10-CM | POA: Diagnosis not present

## 2019-04-14 DIAGNOSIS — G8929 Other chronic pain: Secondary | ICD-10-CM

## 2019-04-14 DIAGNOSIS — M542 Cervicalgia: Secondary | ICD-10-CM | POA: Diagnosis not present

## 2019-04-14 DIAGNOSIS — M545 Low back pain, unspecified: Secondary | ICD-10-CM

## 2019-04-14 DIAGNOSIS — K219 Gastro-esophageal reflux disease without esophagitis: Secondary | ICD-10-CM

## 2019-04-14 DIAGNOSIS — M62838 Other muscle spasm: Secondary | ICD-10-CM

## 2019-04-14 DIAGNOSIS — M79602 Pain in left arm: Secondary | ICD-10-CM

## 2019-04-14 DIAGNOSIS — M792 Neuralgia and neuritis, unspecified: Secondary | ICD-10-CM

## 2019-04-14 DIAGNOSIS — M79605 Pain in left leg: Secondary | ICD-10-CM

## 2019-04-14 DIAGNOSIS — M7918 Myalgia, other site: Secondary | ICD-10-CM

## 2019-04-14 DIAGNOSIS — M79601 Pain in right arm: Secondary | ICD-10-CM

## 2019-04-14 DIAGNOSIS — M150 Primary generalized (osteo)arthritis: Secondary | ICD-10-CM

## 2019-04-14 DIAGNOSIS — E559 Vitamin D deficiency, unspecified: Secondary | ICD-10-CM

## 2019-04-14 DIAGNOSIS — M159 Polyosteoarthritis, unspecified: Secondary | ICD-10-CM

## 2019-04-14 MED ORDER — OXYCODONE HCL 5 MG PO TABS: 5.0000 mg | ORAL_TABLET | Freq: Three times a day (TID) | ORAL | 0 refills | Status: DC | PRN

## 2019-04-14 MED ORDER — MELOXICAM 15 MG PO TABS: 15.0000 mg | ORAL_TABLET | Freq: Every day | ORAL | 2 refills | Status: DC

## 2019-04-14 MED ORDER — GABAPENTIN 100 MG PO CAPS: ORAL_CAPSULE | ORAL | 2 refills | Status: DC

## 2019-04-14 MED ORDER — TIZANIDINE HCL 4 MG PO TABS: 4.0000 mg | ORAL_TABLET | Freq: Three times a day (TID) | ORAL | 2 refills | Status: DC | PRN

## 2019-04-14 MED ORDER — MAGNESIUM 500 MG PO CAPS: 500.0000 mg | ORAL_CAPSULE | Freq: Two times a day (BID) | ORAL | 2 refills | Status: DC

## 2019-04-22 ENCOUNTER — Other Ambulatory Visit: Payer: Self-pay | Admitting: Family Medicine

## 2019-04-22 ENCOUNTER — Encounter: Payer: Medicare HMO | Attending: Family Medicine | Admitting: Dietician

## 2019-04-22 ENCOUNTER — Encounter: Payer: Self-pay | Admitting: Dietician

## 2019-04-22 ENCOUNTER — Other Ambulatory Visit: Payer: Self-pay

## 2019-04-22 VITALS — Ht 63.0 in | Wt 253.9 lb

## 2019-04-22 DIAGNOSIS — Z6841 Body Mass Index (BMI) 40.0 and over, adult: Secondary | ICD-10-CM

## 2019-04-22 DIAGNOSIS — J302 Other seasonal allergic rhinitis: Secondary | ICD-10-CM

## 2019-04-22 DIAGNOSIS — Z713 Dietary counseling and surveillance: Secondary | ICD-10-CM | POA: Diagnosis not present

## 2019-04-22 DIAGNOSIS — G4730 Sleep apnea, unspecified: Secondary | ICD-10-CM | POA: Diagnosis not present

## 2019-04-22 DIAGNOSIS — G8929 Other chronic pain: Secondary | ICD-10-CM | POA: Insufficient documentation

## 2019-04-22 DIAGNOSIS — M545 Low back pain: Secondary | ICD-10-CM | POA: Insufficient documentation

## 2019-04-22 DIAGNOSIS — I10 Essential (primary) hypertension: Secondary | ICD-10-CM | POA: Diagnosis not present

## 2019-04-22 DIAGNOSIS — M150 Primary generalized (osteo)arthritis: Secondary | ICD-10-CM | POA: Insufficient documentation

## 2019-04-22 NOTE — Patient Instructions (Signed)
   Keep working to increase exercise by walking with friend/ fam member, or join class at gym.   Great job limiting unhealthy snacks/ "junk" foods, keep including healthy fruits and small portions of crushed nuts for snacks.   If you eat a salad for a meal, make sure to have some protein like cheese, boiled egg, deli meat, or grilled chicken, or tuna + some carb like crackers, fruit, or beans or corn.  Include beans as one of your vegetables when you don't include meat or other protein with a meal.

## 2019-04-22 NOTE — Progress Notes (Signed)
Medical Nutrition Therapy: Visit start time: 1100  end time: 1130  Assessment:  Diagnosis: obesity Medical history changes: none Psychosocial issues/ stress concerns: none  Current weight: 253.9lbs  Height: 5'3" Medications, supplement changes: reconciled list in medical record  Progress and evaluation:   Weight loss of 4.3lbs since previous visit on 02/11/19  Patient had labwork 02/2019, and HbA1C had increased to 11.2%; patient has started taking Metformin as prescribed and reports recent BG readings from home tests at 120s or below.   Patient reports eating smaller portions of foods  She states she has stopped all "junk" foods for snacks, eating only fruit or crushed nuts for occasional snacks.  She is considering joining a gym for dance/ cardio classes.    Physical activity: walks to son's house next door, helped him move 3 weeks ago; stays busy daily  Dietary Intake:  Usual eating pattern includes 3 meals and 0-2 snacks per day. Dining out frequency: 0-1 meals per week.  Breakfast: 9/8 chicken salad; 9/7 bacon and eggs and toast Snack: none or fruit Lunch: 1pm -- sandwich + fruit Snack: none or fruit or 2 small spoonfuls of crushed pecans Supper: 7:30pm -- 9/7 chicken salad with crackers, lettuce;  9/7 [place] wings, dressing potato salad, peas and corn Snack: none Beverages: water 4 bottles daily; occasional glass of Pepsi or grape soda (does not like diet soda); sugar free lemonade  Nutrition Care Education: Topics covered: weight control Basic nutrition: appropriate nutrient balance    Weight control: reviewed progress since previous visit; discussed options for protein choices with meals and advised protein source with all meals; discussed options for exercise Advanced nutrition:  recipe modification, cooking techniques, dining out, food label reading   Nutritional Diagnosis:  Espy-3.3 Overweight/obesity As related to history of excess calories and inactivity.  As  evidenced by patient with current BMi of 44.68, making dietary and lifestyle changes for ongoing weight loss.  Intervention:   Commended patient for changes and progress made.  Discussion as noted above.  Updated goals with input from patient.  Patient requested one additional follow-up in the new year; scheduled accordingly.  Education Materials given:  Marland Kitchen Goals/ instructions   Learner/ who was taught:  . Patient    Level of understanding: Marland Kitchen Verbalizes/ demonstrates competency   Demonstrated degree of understanding via:   Teach back Learning barriers: . None  Willingness to learn/ readiness for change: . Eager, change in progress   Monitoring and Evaluation:  Dietary intake, exercise, BG control, and body weight      follow up: 08/19/19 at 11:00am

## 2019-04-23 NOTE — Telephone Encounter (Signed)
Spoke with patient and she is still taking this medication and it is helping. Refill sent

## 2019-04-30 ENCOUNTER — Other Ambulatory Visit: Payer: Self-pay

## 2019-04-30 MED ORDER — EZETIMIBE 10 MG PO TABS: 10.0000 mg | ORAL_TABLET | Freq: Every day | ORAL | 0 refills | Status: DC

## 2019-05-01 ENCOUNTER — Other Ambulatory Visit: Payer: Self-pay | Admitting: Family Medicine

## 2019-05-13 ENCOUNTER — Other Ambulatory Visit: Payer: Self-pay | Admitting: Family Medicine

## 2019-05-13 DIAGNOSIS — I10 Essential (primary) hypertension: Secondary | ICD-10-CM

## 2019-05-29 ENCOUNTER — Ambulatory Visit: Payer: Medicare HMO | Admitting: Podiatry

## 2019-05-30 ENCOUNTER — Other Ambulatory Visit: Payer: Self-pay | Admitting: Family Medicine

## 2019-06-09 ENCOUNTER — Ambulatory Visit (INDEPENDENT_AMBULATORY_CARE_PROVIDER_SITE_OTHER): Payer: Medicare HMO | Admitting: Podiatry

## 2019-06-09 ENCOUNTER — Other Ambulatory Visit: Payer: Self-pay

## 2019-06-09 ENCOUNTER — Encounter: Payer: Self-pay | Admitting: Podiatry

## 2019-06-09 DIAGNOSIS — M79675 Pain in left toe(s): Secondary | ICD-10-CM | POA: Diagnosis not present

## 2019-06-09 DIAGNOSIS — M79674 Pain in right toe(s): Secondary | ICD-10-CM | POA: Diagnosis not present

## 2019-06-09 DIAGNOSIS — B351 Tinea unguium: Secondary | ICD-10-CM

## 2019-06-09 NOTE — Progress Notes (Signed)
Complaint:  Visit Type: Patient returns to my office for continued preventative foot care services. Complaint: Patient states" my nails have grown long and thick and become painful to walk and wear shoes" Patient has been diagnosed with neuropathy for which she takes gabapentin.. The patient presents for preventative foot care services. No changes to ROS  Podiatric Exam: Vascular: dorsalis pedis and posterior tibial pulses are palpable bilateral. Capillary return is immediate. Temperature gradient is WNL. Skin turgor WNL  Sensorium: Normal Semmes Weinstein monofilament test. Normal tactile sensation bilaterally. Nail Exam: Pt has thick disfigured discolored nails with subungual debris noted bilateral entire nail hallux through fifth toenails Ulcer Exam: There is no evidence of ulcer or pre-ulcerative changes or infection. Orthopedic Exam: Muscle tone and strength are WNL. No limitations in general ROM. No crepitus or effusions noted. Foot type and digits show no abnormalities. Bony prominences are unremarkable. Skin: No Porokeratosis. No infection or ulcers  Diagnosis:  Onychomycosis, , Pain in right toe, pain in left toes  Treatment & Plan Procedures and Treatment: Consent by patient was obtained for treatment procedures.   Debridement of mycotic and hypertrophic toenails, 1 through 5 bilateral and clearing of subungual debris. No ulceration, no infection noted.  Return Visit-Office Procedure: Patient instructed to return to the office for a follow up visit 3 months for continued evaluation and treatment.    Brixon Zhen DPM 

## 2019-06-11 ENCOUNTER — Telehealth: Payer: Self-pay

## 2019-06-11 ENCOUNTER — Ambulatory Visit (INDEPENDENT_AMBULATORY_CARE_PROVIDER_SITE_OTHER): Payer: Medicare HMO | Admitting: Family Medicine

## 2019-06-11 ENCOUNTER — Other Ambulatory Visit: Payer: Self-pay

## 2019-06-11 ENCOUNTER — Encounter: Payer: Self-pay | Admitting: Family Medicine

## 2019-06-11 VITALS — BP 112/76 | HR 79 | Temp 97.2°F | Ht 63.5 in | Wt 250.8 lb

## 2019-06-11 DIAGNOSIS — E039 Hypothyroidism, unspecified: Secondary | ICD-10-CM | POA: Diagnosis not present

## 2019-06-11 DIAGNOSIS — Z6841 Body Mass Index (BMI) 40.0 and over, adult: Secondary | ICD-10-CM

## 2019-06-11 DIAGNOSIS — Z23 Encounter for immunization: Secondary | ICD-10-CM | POA: Diagnosis not present

## 2019-06-11 DIAGNOSIS — E119 Type 2 diabetes mellitus without complications: Secondary | ICD-10-CM | POA: Diagnosis not present

## 2019-06-11 DIAGNOSIS — Z720 Tobacco use: Secondary | ICD-10-CM | POA: Diagnosis not present

## 2019-06-11 LAB — POCT GLYCOSYLATED HEMOGLOBIN (HGB A1C): Hemoglobin A1C: 6.8 % — AB (ref 4.0–5.6)

## 2019-06-11 NOTE — Progress Notes (Signed)
Acute Office Visit  Subjective:    Patient ID: Teresa Mcgee, female    DOB: 22-Dec-1965, 53 y.o.   MRN: [id number]  No chief complaint on file.   HPI Patient is in today for follow up of her diabetes. Her last A1C was 11.7 on 02/2019.  Pt currently takes Metformin 500 mg twice a day. Pt occasionally have diarrhea (once a day) , last episode yesterday. Pt checks her blood sugars twice a day. Her highset blood sugar in the morning 119, the highest in the evening 144. Pt denies any signs and symptoms of hypoglycemia.   Pt has elevated TSH level 6.6  form 02/2019. Going to recheck it today. Pt continues to take her levothyroxine.Pt denies any fatigue and weakness.   Pt expressed concern about extra skin tissue under left arm. It is bigger than on the right side, but soft to palpations without any nodules, skin is intact without any lesions/rashes. Pt lost some weight recently. Pt denies any pain, swelling, warmth.   Past Medical History:  Diagnosis Date  . Arthritis   . Bell's palsy may 2012  . Chest pain   . GERD (gastroesophageal reflux disease)   . H/O hiatal hernia   . Hyperlipidemia   . Hypertension   . Hypothyroidism   . Shingles   . Shortness of breath   . Sleep apnea    uses cpap    Past Surgical History:  Procedure Laterality Date  . CESAREAN SECTION    . cesarian     3x  . PARTIAL HYSTERECTOMY  2000   abnormal uterine bleeding  . THYROIDECTOMY  1998  . TOOTH EXTRACTION    . TUBAL LIGATION      Family History  Problem Relation Age of Onset  . Heart attack Father 36       deceased  . Diabetes Brother   . Stroke Brother 25       twice  . Stroke Sister 4  . Diabetes Sister   . Heart attack Sister 44  . Heart attack Brother 55    Social History   Socioeconomic History  . Marital status: Married    Spouse name: Not on file  . Number of children: Not on file  . Years of education: Not on file  . Highest education level: Not on file  Occupational  History  . Occupation: disabled    Fish farm manager: DISABILITY    Comment: back  Social Needs  . Financial resource strain: Not on file  . Food insecurity    Worry: Not on file    Inability: Not on file  . Transportation needs    Medical: Not on file    Non-medical: Not on file  Tobacco Use  . Smoking status: Current Every Day Smoker    Packs/day: 0.20    Years: 20.00    Pack years: 4.00    Types: Cigarettes    Last attempt to quit: 04/29/2018    Years since quitting: 1.1  . Smokeless tobacco: Never Used  . Tobacco comment: used to smoke 1.5 - 2 PPD, now down to 3-4 cigs a day  Substance and Sexual Activity  . Alcohol use: No  . Drug use: No  . Sexual activity: Not Currently  Lifestyle  . Physical activity    Days per week: Not on file    Minutes per session: Not on file  . Stress: Not on file  Relationships  . Social Herbalist on  phone: Not on file    Gets together: Not on file    Attends religious service: Not on file    Active member of club or organization: Not on file    Attends meetings of clubs or organizations: Not on file    Relationship status: Not on file  . Intimate partner violence    Fear of current or ex partner: Not on file    Emotionally abused: Not on file    Physically abused: Not on file    Forced sexual activity: Not on file  Other Topics Concern  . Not on file  Social History Narrative   Married but she and husband separated, she helps take care of him (he is in poor health)   She lives with her grown son who is mentally disabled   She has been on disability since 2011    Outpatient Medications Prior to Visit  Medication Sig Dispense Refill  . Accu-Chek FastClix Lancets MISC USE UP TO 4 TIMES DAILY AS DIRECTED 102 each 1  . ACCU-CHEK GUIDE test strip USE UP TO 4 TIMES DAILY AS DIRECTED 100 strip 5  . albuterol (PROVENTIL HFA;VENTOLIN HFA) 108 (90 Base) MCG/ACT inhaler Inhale 2 puffs into the lungs every 4 (four) hours as needed for  wheezing or shortness of breath (cough, shortness of breath or wheezing.). 1 Inhaler 1  . amLODipine (NORVASC) 10 MG tablet TAKE 1 TABLET BY MOUTH EVERY DAY 90 tablet 1  . azelastine (OPTIVAR) 0.05 % ophthalmic solution Place 1 drop into both eyes 2 (two) times daily as needed. 6 mL 12  . blood glucose meter kit and supplies Dispense based on patient and insurance preference. Use up to four times daily as directed. (FOR ICD-10 E10.9, E11.9). 1 each 0  . [employer] D3 125 MCG (5000 UT) capsule TAKE 1 CAPSULE BY MOUTH DAILY WITH BREAKFAST. TAKE ALONG WITH CALCIUM AND MAGNESIUM.  5  . ezetimibe (ZETIA) 10 MG tablet Take 1 tablet (10 mg total) by mouth daily. 90 tablet 0  . fluticasone (FLONASE) 50 MCG/ACT nasal spray Place 2 sprays into both nostrils daily. 16 g 6  . gabapentin (NEURONTIN) 100 MG capsule Take 1 capsule (100 mg total) by mouth 2 (two) times daily AND 3 capsules (300 mg total) at bedtime. 150 capsule 2  . hydrochlorothiazide (HYDRODIURIL) 25 MG tablet TAKE 1 TABLET (25 MG TOTAL) BY MOUTH DAILY. AS DIRECTED 90 tablet 2  . levocetirizine (XYZAL) 5 MG tablet TAKE 1 TABLET BY MOUTH EVERY DAY IN THE EVENING 90 tablet 2  . levothyroxine (SYNTHROID) 112 MCG tablet TAKE 1 TABLET BY MOUTH EVERY DAY 30 tablet 0  . levothyroxine (SYNTHROID) 125 MCG tablet Take 1 tablet (125 mcg total) by mouth daily. 90 tablet 1  . Magnesium 500 MG CAPS Take 1 capsule (500 mg total) by mouth 2 (two) times daily at 8 am and 10 pm. 60 capsule 2  . meloxicam (MOBIC) 15 MG tablet Take 1 tablet (15 mg total) by mouth daily. 30 tablet 2  . metFORMIN (GLUCOPHAGE) 500 MG tablet Take 1 tablet (500 mg total) by mouth 2 (two) times daily with a meal. 180 tablet 3  . Olopatadine HCl 0.2 % SOLN INSTILL 1 DROP INTO BOTH EYES EVERY DAY AS NEEDED    . oxyCODONE (OXY IR/ROXICODONE) 5 MG immediate release tablet Take 1 tablet (5 mg total) by mouth every 8 (eight) hours as needed for severe pain. Must last 30 days 90 tablet 0  . oxyCODONE  (  OXY IR/ROXICODONE) 5 MG immediate release tablet Take 1 tablet (5 mg total) by mouth every 8 (eight) hours as needed for severe pain. Must last 30 days 90 tablet 0  . [START ON 06/19/2019] oxyCODONE (OXY IR/ROXICODONE) 5 MG immediate release tablet Take 1 tablet (5 mg total) by mouth every 8 (eight) hours as needed for severe pain. Must last 30 days 90 tablet 0  . REFRESH CELLUVISC 1 % GEL APPLY 1 DROP TO EYE 3 (THREE) TIMES DAILY.  12  . tiZANidine (ZANAFLEX) 4 MG tablet Take 1 tablet (4 mg total) by mouth every 8 (eight) hours as needed for muscle spasms. 90 tablet 2   No facility-administered medications prior to visit.     No Known Allergies  Review of Systems  Constitutional: Negative for chills and fever.  Respiratory: Negative for cough, shortness of breath and wheezing.   Cardiovascular: Negative for chest pain, palpitations and leg swelling.  Gastrointestinal: Positive for diarrhea. Negative for abdominal pain, blood in stool, constipation, nausea and vomiting.  Genitourinary: Negative for dysuria.  Musculoskeletal: Positive for back pain.       Objective:    Physical Exam  Constitutional: She is oriented to person, place, and time. She appears well-developed and well-nourished. No distress.  Eyes: Pupils are equal, round, and reactive to light. Conjunctivae and EOM are normal.  Neck: Normal range of motion. Neck supple.  Cardiovascular: Normal rate, regular rhythm and normal heart sounds. Exam reveals no gallop and no friction rub.  No murmur heard. Pulmonary/Chest: Effort normal and breath sounds normal. She has no wheezes. She has no rales. She exhibits no tenderness.  Abdominal: Soft. Bowel sounds are normal. She exhibits no distension. There is no abdominal tenderness.  Musculoskeletal:        General: No edema.  Neurological: She is alert and oriented to person, place, and time.  Skin: Skin is warm and dry. She is not diaphoretic.  Psychiatric: She has a normal mood  and affect.    There were no vitals taken for this visit. Wt Readings from Last 3 Encounters:  04/22/19 115.2 kg  02/11/19 117.1 kg  12/31/18 118.3 kg    Health Maintenance Due  Topic Date Due  . PNEUMOCOCCAL POLYSACCHARIDE VACCINE AGE 41-64 HIGH RISK  07/29/1968  . OPHTHALMOLOGY EXAM  07/29/1976  . PAP SMEAR-Modifier  06/07/2014  . MAMMOGRAM  07/29/2016  . COLONOSCOPY  07/29/2016  . INFLUENZA VACCINE  03/15/2019    There are no preventive care reminders to display for this patient.   Lab Results  Component Value Date   TSH 6.60 (H) 03/06/2019   Lab Results  Component Value Date   WBC 7.8 05/25/2018   HGB 14.5 05/25/2018   HCT 44.0 05/25/2018   MCV 91.3 05/25/2018   PLT 238 05/25/2018   Lab Results  Component Value Date   NA 135 03/06/2019   K 3.6 03/06/2019   CO2 31 03/06/2019   GLUCOSE 247 (H) 03/06/2019   BUN 10 03/06/2019   CREATININE 0.86 03/06/2019   BILITOT 0.6 03/06/2019   ALKPHOS 76 03/06/2019   AST 113 (H) 03/06/2019   ALT 107 (H) 03/06/2019   PROT 7.9 03/06/2019   ALBUMIN 4.4 03/06/2019   CALCIUM 10.1 03/06/2019   ANIONGAP 9 05/25/2018   GFR 83.65 03/06/2019   Lab Results  Component Value Date   CHOL 229 (H) 03/06/2019   Lab Results  Component Value Date   HDL 45.20 03/06/2019   Lab Results  Component Value  Date   LDLCALC 155 (H) 03/06/2019   Lab Results  Component Value Date   TRIG 146.0 03/06/2019   Lab Results  Component Value Date   CHOLHDL 5 03/06/2019   Lab Results  Component Value Date   HGBA1C 11.7 (H) 03/06/2019       Assessment & Plan:   1. Type 2 diabetes mellitus without complication, without long-term current use of insulin (HCC) Imptoved, today A1c 6.8 - HgB A1c  2. Acquired hypothyroidism - TSH; Future  3. Need for immunization against influenza - Flu Vaccine QUAD 36+ mos IM  Problem List Items Addressed This Visit      Endocrine   Type 2 diabetes mellitus without complication, without long-term  current use of insulin ([place]) - Primary   Relevant Orders   HgB A1c       No orders of the defined types were placed in this encounter.    Rica Koyanagi, RN

## 2019-06-11 NOTE — Patient Instructions (Signed)
Follow up in 3-4 months Call number below about patches Let me know if skin under left arm changes, we can get an ultraound  Coping with Quitting Smoking  Quitting smoking is a physical and mental challenge. You will face cravings, withdrawal symptoms, and temptation. Before quitting, work with your health care provider to make a plan that can help you cope. Preparation can help you quit and keep you from giving in. How can I cope with cravings? Cravings usually last for 5-10 minutes. If you get through it, the craving will pass. Consider taking the following actions to help you cope with cravings:  Keep your mouth busy: ? Chew sugar-free gum. ? Suck on hard candies or a straw. ? Brush your teeth.  Keep your hands and body busy: ? Immediately change to a different activity when you feel a craving. ? Squeeze or play with a ball. ? Do an activity or a hobby, like making bead jewelry, practicing needlepoint, or working with wood. ? Mix up your normal routine. ? Take a short exercise break. Go for a quick walk or run up and down stairs. ? Spend time in public places where smoking is not allowed.  Focus on doing something kind or helpful for someone else.  Call a friend or family member to talk during a craving.  Join a support group.  Call a quit line, such as 1-800-QUIT-NOW.  Talk with your health care provider about medicines that might help you cope with cravings and make quitting easier for you. How can I deal with withdrawal symptoms? Your body may experience negative effects as it tries to get used to not having nicotine in the system. These effects are called withdrawal symptoms. They may include:  Feeling hungrier than normal.  Trouble concentrating.  Irritability.  Trouble sleeping.  Feeling depressed.  Restlessness and agitation.  Craving a cigarette. To manage withdrawal symptoms:  Avoid places, people, and activities that trigger your cravings.  Remember  why you want to quit.  Get plenty of sleep.  Avoid coffee and other caffeinated drinks. These may worsen some of your symptoms. How can I handle social situations? Social situations can be difficult when you are quitting smoking, especially in the first few weeks. To manage this, you can:  Avoid parties, bars, and other social situations where people might be smoking.  Avoid alcohol.  Leave right away if you have the urge to smoke.  Explain to your family and friends that you are quitting smoking. Ask for understanding and support.  Plan activities with friends or family where smoking is not an option. What are some ways I can cope with stress? Wanting to smoke may cause stress, and stress can make you want to smoke. Find ways to manage your stress. Relaxation techniques can help. For example:  Breathe slowly and deeply, in through your nose and out through your mouth.  Listen to soothing, relaxing music.  Talk with a family member or friend about your stress.  Light a candle.  Soak in a bath or take a shower.  Think about a peaceful place. What are some ways I can prevent weight gain? Be aware that many people gain weight after they quit smoking. However, not everyone does. To keep from gaining weight, have a plan in place before you quit and stick to the plan after you quit. Your plan should include:  Having healthy snacks. When you have a craving, it may help to: ? Eat plain popcorn, crunchy carrots, celery,  or other cut vegetables. ? Chew sugar-free gum.  Changing how you eat: ? Eat small portion sizes at meals. ? Eat 4-6 small meals throughout the day instead of 1-2 large meals a day. ? Be mindful when you eat. Do not watch television or do other things that might distract you as you eat.  Exercising regularly: ? Make time to exercise each day. If you do not have time for a long workout, do short bouts of exercise for 5-10 minutes several times a day. ? Do some form  of strengthening exercise, like weight lifting, and some form of aerobic exercise, like running or swimming.  Drinking plenty of water or other low-calorie or no-calorie drinks. Drink 6-8 glasses of water daily, or as much as instructed by your health care provider. Summary  Quitting smoking is a physical and mental challenge. You will face cravings, withdrawal symptoms, and temptation to smoke again. Preparation can help you as you go through these challenges.  You can cope with cravings by keeping your mouth busy (such as by chewing gum), keeping your body and hands busy, and making calls to family, friends, or a helpline for people who want to quit smoking.  You can cope with withdrawal symptoms by avoiding places where people smoke, avoiding drinks with caffeine, and getting plenty of rest.  Ask your health care provider about the different ways to prevent weight gain, avoid stress, and handle social situations. This information is not intended to replace advice given to you by your health care provider. Make sure you discuss any questions you have with your health care provider. Document Released: 07/28/2016 Document Revised: 07/13/2017 Document Reviewed: 07/28/2016 Elsevier Patient Education  2020 [employer].

## 2019-06-11 NOTE — Progress Notes (Signed)
Subjective:    Patient ID: Teresa Mcgee, female    DOB: 09/27/1965, 53 y.o.   MRN: [id number]  HPI This is a 53 yo female who presents today for follow up of chronic medical conditions. Has been doing ok with pandemic. Continues to take care of husband.   Chronic pain- continues to see pain management.   Diabetes mellitus- elevated hgba1c at last visit. Has met with nutrition. Tolerating metformin without bothersome side effects.   Tobacco abuse- smoking 3-4 cigarettes a day, having a hard time getting off the last ones.    Past Medical History:  Diagnosis Date  . Arthritis   . Bell's palsy may 2012  . Chest pain   . GERD (gastroesophageal reflux disease)   . H/O hiatal hernia   . Hyperlipidemia   . Hypertension   . Hypothyroidism   . Shingles   . Shortness of breath   . Sleep apnea    uses cpap   Past Surgical History:  Procedure Laterality Date  . CESAREAN SECTION    . cesarian     3x  . PARTIAL HYSTERECTOMY  2000   abnormal uterine bleeding  . THYROIDECTOMY  1998  . TOOTH EXTRACTION    . TUBAL LIGATION     Family History  Problem Relation Age of Onset  . Heart attack Father 55       deceased  . Diabetes Brother   . Stroke Brother 25       twice  . Stroke Sister 49  . Diabetes Sister   . Heart attack Sister 67  . Heart attack Brother 54   Social History   Tobacco Use  . Smoking status: Current Every Day Smoker    Packs/day: 0.20    Years: 20.00    Pack years: 4.00    Types: Cigarettes    Last attempt to quit: 04/29/2018    Years since quitting: 1.1  . Smokeless tobacco: Never Used  . Tobacco comment: used to smoke 1.5 - 2 PPD, now down to 3-4 cigs a day  Substance Use Topics  . Alcohol use: No  . Drug use: No      Review of Systems     Objective:   Physical Exam Vitals signs reviewed.  Constitutional:      General: She is not in acute distress.    Appearance: Normal appearance. She is obese. She is not ill-appearing,  toxic-appearing or diaphoretic.  HENT:     Head: Normocephalic and atraumatic.  Eyes:     Conjunctiva/sclera: Conjunctivae normal.  Cardiovascular:     Rate and Rhythm: Normal rate.  Pulmonary:     Effort: Pulmonary effort is normal.  Neurological:     Mental Status: She is alert and oriented to person, place, and time.          BP 112/76   Pulse 79   Temp (!) 97.2 F (36.2 C) (Temporal)   Ht 5' 3.5" (1.613 m)   Wt 250 lb 12.8 oz (113.8 kg)   SpO2 97%   BMI 43.73 kg/m  Wt Readings from Last 3 Encounters:  06/11/19 250 lb 12.8 oz (113.8 kg)  04/22/19 253 lb 14.4 oz (115.2 kg)  02/11/19 258 lb 3.2 oz (117.1 kg)   Results for orders placed or performed in visit on 06/11/19  HgB A1c  Result Value Ref Range   Hemoglobin A1C 6.8 (A) 4.0 - 5.6 %   HbA1c POC (<> result, manual entry)  HbA1c, POC (prediabetic range)     HbA1c, POC (controlled diabetic range)      Assessment & Plan:  1. Type 2 diabetes mellitus without complication, without long-term current use of insulin (HCC) - significantly improved hgba1c with some weight loss.  - HgB A1c  2. Acquired hypothyroidism - TSH; Future  3. Need for immunization against influenza - Flu Vaccine QUAD 36+ mos IM  4. Tobacco abuse - provided her information for smoking cessation - she is interested in nicotine replacement patches, can call number and get patches for free  5. Morbid obesity with BMI of 40.0-44.9, adult ([place]) - has had some weight loss and encouraged her efforts to continue improved food choices  - follow up in 3-4 months for cpe  Clarene Reamer, FNP-BC  [facility], [place]  06/13/2019 8:16 AM

## 2019-06-11 NOTE — Telephone Encounter (Signed)
I called patient to let her know that when she was here that TSH was ordered, but not drawn. I asked if she was willing to come back in & have drawn. Pt agreed & scheduled for tomorrow at 10:15.

## 2019-06-12 ENCOUNTER — Other Ambulatory Visit (INDEPENDENT_AMBULATORY_CARE_PROVIDER_SITE_OTHER): Payer: Medicare HMO

## 2019-06-12 DIAGNOSIS — E039 Hypothyroidism, unspecified: Secondary | ICD-10-CM

## 2019-06-13 LAB — TSH: TSH: 1.04 u[IU]/mL (ref 0.35–4.50)

## 2019-06-16 ENCOUNTER — Encounter: Payer: Self-pay | Admitting: Internal Medicine

## 2019-06-16 ENCOUNTER — Ambulatory Visit (INDEPENDENT_AMBULATORY_CARE_PROVIDER_SITE_OTHER): Payer: Medicare HMO | Admitting: Internal Medicine

## 2019-06-16 ENCOUNTER — Telehealth: Payer: Self-pay

## 2019-06-16 ENCOUNTER — Other Ambulatory Visit: Payer: Self-pay

## 2019-06-16 VITALS — BP 122/76 | HR 70 | Temp 97.7°F | Wt 249.0 lb

## 2019-06-16 DIAGNOSIS — R3915 Urgency of urination: Secondary | ICD-10-CM

## 2019-06-16 DIAGNOSIS — R300 Dysuria: Secondary | ICD-10-CM | POA: Diagnosis not present

## 2019-06-16 DIAGNOSIS — R350 Frequency of micturition: Secondary | ICD-10-CM

## 2019-06-16 LAB — POC URINALSYSI DIPSTICK (AUTOMATED)
Bilirubin, UA: NEGATIVE
Glucose, UA: NEGATIVE
Ketones, UA: NEGATIVE
Nitrite, UA: NEGATIVE
Protein, UA: NEGATIVE
Spec Grav, UA: 1.015 (ref 1.010–1.025)
Urobilinogen, UA: 0.2 E.U./dL
pH, UA: 6 (ref 5.0–8.0)

## 2019-06-16 MED ORDER — AMOXICILLIN 500 MG PO CAPS: 500.0000 mg | ORAL_CAPSULE | Freq: Two times a day (BID) | ORAL | 0 refills | Status: DC

## 2019-06-16 NOTE — Telephone Encounter (Signed)
I spoke with pt; pt has burning upon urination with frequency and urgency. Pt has no covid symptoms, no travel and no known exposure to + covid.pt scheduled  30' in office appt with Avie Echevaria NP today at 11:30. Pt will be here 10 - 15 ' early to get signed in out front and UC precautions given and pt voiced understanding.

## 2019-06-16 NOTE — Patient Instructions (Signed)
Urinary Tract Infection, Adult A urinary tract infection (UTI) is an infection of any part of the urinary tract. The urinary tract includes:  The kidneys.  The ureters.  The bladder.  The urethra. These organs make, store, and get rid of pee (urine) in the body. What are the causes? This is caused by germs (bacteria) in your genital area. These germs grow and cause swelling (inflammation) of your urinary tract. What increases the risk? You are more likely to develop this condition if:  You have a small, thin tube (catheter) to drain pee.  You cannot control when you pee or poop (incontinence).  You are female, and: ? You use these methods to prevent pregnancy: ? A medicine that kills sperm (spermicide). ? A device that blocks sperm (diaphragm). ? You have low levels of a female hormone (estrogen). ? You are pregnant.  You have genes that add to your risk.  You are sexually active.  You take antibiotic medicines.  You have trouble peeing because of: ? A prostate that is bigger than normal, if you are female. ? A blockage in the part of your body that drains pee from the bladder (urethra). ? A kidney stone. ? A nerve condition that affects your bladder (neurogenic bladder). ? Not getting enough to drink. ? Not peeing often enough.  You have other conditions, such as: ? Diabetes. ? A weak disease-fighting system (immune system). ? Sickle cell disease. ? Gout. ? Injury of the spine. What are the signs or symptoms? Symptoms of this condition include:  Needing to pee right away (urgently).  Peeing often.  Peeing small amounts often.  Pain or burning when peeing.  Blood in the pee.  Pee that smells bad or not like normal.  Trouble peeing.  Pee that is cloudy.  Fluid coming from the vagina, if you are female.  Pain in the belly or lower back. Other symptoms include:  Throwing up (vomiting).  No urge to eat.  Feeling mixed up (confused).  Being tired  and grouchy (irritable).  A fever.  Watery poop (diarrhea). How is this treated? This condition may be treated with:  Antibiotic medicine.  Other medicines.  Drinking enough water. Follow these instructions at home:  Medicines  Take over-the-counter and prescription medicines only as told by your doctor.  If you were prescribed an antibiotic medicine, take it as told by your doctor. Do not stop taking it even if you start to feel better. General instructions  Make sure you: ? Pee until your bladder is empty. ? Do not hold pee for a long time. ? Empty your bladder after sex. ? Wipe from front to back after pooping if you are a female. Use each tissue one time when you wipe.  Drink enough fluid to keep your pee pale yellow.  Keep all follow-up visits as told by your doctor. This is important. Contact a doctor if:  You do not get better after 1-2 days.  Your symptoms go away and then come back. Get help right away if:  You have very bad back pain.  You have very bad pain in your lower belly.  You have a fever.  You are sick to your stomach (nauseous).  You are throwing up. Summary  A urinary tract infection (UTI) is an infection of any part of the urinary tract.  This condition is caused by germs in your genital area.  There are many risk factors for a UTI. These include having a small, thin   tube to drain pee and not being able to control when you pee or poop.  Treatment includes antibiotic medicines for germs.  Drink enough fluid to keep your pee pale yellow. This information is not intended to replace advice given to you by your health care provider. Make sure you discuss any questions you have with your health care provider. Document Released: 01/17/2008 Document Revised: 07/18/2018 Document Reviewed: 02/07/2018 Elsevier Patient Education  2020 [employer]  

## 2019-06-16 NOTE — Progress Notes (Deleted)
Acute Office Visit  Subjective:    Patient ID: Teresa Mcgee, female    DOB: July 18, 1966, 53 y.o.   MRN: [id number]  Chief Complaint  Patient presents with  . Dysuria    x 2-3 days... denies flank/abdominal pain.... has increased water intake... denies hematuria  . Urinary Frequency    and urgency....    HPI Patient is in today for burning when urinating. This has been going on for about 3 days. She mentions mild abdominal discomfort. She mentions she has had urgency. She has tried AZIO but without relief. Denies any fever, chills, nausea, vomiting or hematuria.   Past Medical History:  Diagnosis Date  . Arthritis   . Bell's palsy may 2012  . Chest pain   . GERD (gastroesophageal reflux disease)   . H/O hiatal hernia   . Hyperlipidemia   . Hypertension   . Hypothyroidism   . Shingles   . Shortness of breath   . Sleep apnea    uses cpap    Past Surgical History:  Procedure Laterality Date  . CESAREAN SECTION    . cesarian     3x  . PARTIAL HYSTERECTOMY  2000   abnormal uterine bleeding  . THYROIDECTOMY  1998  . TOOTH EXTRACTION    . TUBAL LIGATION      Family History  Problem Relation Age of Onset  . Heart attack Father 74       deceased  . Diabetes Brother   . Stroke Brother 25       twice  . Stroke Sister 41  . Diabetes Sister   . Heart attack Sister 38  . Heart attack Brother 41    Social History   Socioeconomic History  . Marital status: Married    Spouse name: Not on file  . Number of children: Not on file  . Years of education: Not on file  . Highest education level: Not on file  Occupational History  . Occupation: disabled    Fish farm manager: DISABILITY    Comment: back  Social Needs  . Financial resource strain: Not on file  . Food insecurity    Worry: Not on file    Inability: Not on file  . Transportation needs    Medical: Not on file    Non-medical: Not on file  Tobacco Use  . Smoking status: Current Every Day Smoker   Packs/day: 0.20    Years: 20.00    Pack years: 4.00    Types: Cigarettes    Last attempt to quit: 04/29/2018    Years since quitting: 1.1  . Smokeless tobacco: Never Used  . Tobacco comment: used to smoke 1.5 - 2 PPD, now down to 3-4 cigs a day  Substance and Sexual Activity  . Alcohol use: No  . Drug use: No  . Sexual activity: Not Currently  Lifestyle  . Physical activity    Days per week: Not on file    Minutes per session: Not on file  . Stress: Not on file  Relationships  . Social Herbalist on phone: Not on file    Gets together: Not on file    Attends religious service: Not on file    Active member of club or organization: Not on file    Attends meetings of clubs or organizations: Not on file    Relationship status: Not on file  . Intimate partner violence    Fear of current or ex partner: Not on  file    Emotionally abused: Not on file    Physically abused: Not on file    Forced sexual activity: Not on file  Other Topics Concern  . Not on file  Social History Narrative   Married but she and husband separated, she helps take care of him (he is in poor health)   She lives with her grown son who is mentally disabled   She has been on disability since 2011    Outpatient Medications Prior to Visit  Medication Sig Dispense Refill  . Accu-Chek FastClix Lancets MISC USE UP TO 4 TIMES DAILY AS DIRECTED 102 each 1  . ACCU-CHEK GUIDE test strip USE UP TO 4 TIMES DAILY AS DIRECTED 100 strip 5  . albuterol (PROVENTIL HFA;VENTOLIN HFA) 108 (90 Base) MCG/ACT inhaler Inhale 2 puffs into the lungs every 4 (four) hours as needed for wheezing or shortness of breath (cough, shortness of breath or wheezing.). 1 Inhaler 1  . amLODipine (NORVASC) 10 MG tablet TAKE 1 TABLET BY MOUTH EVERY DAY 90 tablet 1  . azelastine (OPTIVAR) 0.05 % ophthalmic solution Place 1 drop into both eyes 2 (two) times daily as needed. 6 mL 12  . blood glucose meter kit and supplies Dispense based on  patient and insurance preference. Use up to four times daily as directed. (FOR ICD-10 E10.9, E11.9). 1 each 0  . [facility] D3 125 MCG (5000 UT) capsule TAKE 1 CAPSULE BY MOUTH DAILY WITH BREAKFAST. TAKE ALONG WITH CALCIUM AND MAGNESIUM.  5  . ezetimibe (ZETIA) 10 MG tablet Take 1 tablet (10 mg total) by mouth daily. 90 tablet 0  . fluticasone (FLONASE) 50 MCG/ACT nasal spray Place 2 sprays into both nostrils daily. 16 g 6  . gabapentin (NEURONTIN) 100 MG capsule Take 1 capsule (100 mg total) by mouth 2 (two) times daily AND 3 capsules (300 mg total) at bedtime. 150 capsule 2  . hydrochlorothiazide (HYDRODIURIL) 25 MG tablet TAKE 1 TABLET (25 MG TOTAL) BY MOUTH DAILY. AS DIRECTED 90 tablet 2  . levocetirizine (XYZAL) 5 MG tablet TAKE 1 TABLET BY MOUTH EVERY DAY IN THE EVENING 90 tablet 2  . levothyroxine (SYNTHROID) 112 MCG tablet TAKE 1 TABLET BY MOUTH EVERY DAY 30 tablet 0  . levothyroxine (SYNTHROID) 125 MCG tablet Take 1 tablet (125 mcg total) by mouth daily. 90 tablet 1  . Magnesium 500 MG CAPS Take 1 capsule (500 mg total) by mouth 2 (two) times daily at 8 am and 10 pm. 60 capsule 2  . meloxicam (MOBIC) 15 MG tablet Take 1 tablet (15 mg total) by mouth daily. 30 tablet 2  . metFORMIN (GLUCOPHAGE) 500 MG tablet Take 1 tablet (500 mg total) by mouth 2 (two) times daily with a meal. 180 tablet 3  . Olopatadine HCl 0.2 % SOLN INSTILL 1 DROP INTO BOTH EYES EVERY DAY AS NEEDED    . oxyCODONE (OXY IR/ROXICODONE) 5 MG immediate release tablet Take 1 tablet (5 mg total) by mouth every 8 (eight) hours as needed for severe pain. Must last 30 days 90 tablet 0  . [START ON 06/19/2019] oxyCODONE (OXY IR/ROXICODONE) 5 MG immediate release tablet Take 1 tablet (5 mg total) by mouth every 8 (eight) hours as needed for severe pain. Must last 30 days 90 tablet 0  . REFRESH CELLUVISC 1 % GEL APPLY 1 DROP TO EYE 3 (THREE) TIMES DAILY.  12  . tiZANidine (ZANAFLEX) 4 MG tablet Take 1 tablet (4 mg total) by mouth every 8  (  eight) hours as needed for muscle spasms. 90 tablet 2  . oxyCODONE (OXY IR/ROXICODONE) 5 MG immediate release tablet Take 1 tablet (5 mg total) by mouth every 8 (eight) hours as needed for severe pain. Must last 30 days 90 tablet 0   No facility-administered medications prior to visit.     No Known Allergies  ROS     Objective:    Physical Exam  BP 122/76   Pulse 70   Temp 97.7 F (36.5 C) (Temporal)   Wt 112.9 kg   SpO2 98%   BMI 43.42 kg/m  Wt Readings from Last 3 Encounters:  06/16/19 112.9 kg  06/11/19 113.8 kg  04/22/19 115.2 kg    Health Maintenance Due  Topic Date Due  . PNEUMOCOCCAL POLYSACCHARIDE VACCINE AGE 64-64 HIGH RISK  07/29/1968  . OPHTHALMOLOGY EXAM  07/29/1976  . PAP SMEAR-Modifier  06/07/2014  . MAMMOGRAM  07/29/2016  . COLONOSCOPY  07/29/2016    There are no preventive care reminders to display for this patient.   Lab Results  Component Value Date   TSH 1.04 06/12/2019   Lab Results  Component Value Date   WBC 7.8 05/25/2018   HGB 14.5 05/25/2018   HCT 44.0 05/25/2018   MCV 91.3 05/25/2018   PLT 238 05/25/2018   Lab Results  Component Value Date   NA 135 03/06/2019   K 3.6 03/06/2019   CO2 31 03/06/2019   GLUCOSE 247 (H) 03/06/2019   BUN 10 03/06/2019   CREATININE 0.86 03/06/2019   BILITOT 0.6 03/06/2019   ALKPHOS 76 03/06/2019   AST 113 (H) 03/06/2019   ALT 107 (H) 03/06/2019   PROT 7.9 03/06/2019   ALBUMIN 4.4 03/06/2019   CALCIUM 10.1 03/06/2019   ANIONGAP 9 05/25/2018   GFR 83.65 03/06/2019   Lab Results  Component Value Date   CHOL 229 (H) 03/06/2019   Lab Results  Component Value Date   HDL 45.20 03/06/2019   Lab Results  Component Value Date   LDLCALC 155 (H) 03/06/2019   Lab Results  Component Value Date   TRIG 146.0 03/06/2019   Lab Results  Component Value Date   CHOLHDL 5 03/06/2019   Lab Results  Component Value Date   HGBA1C 6.8 (A) 06/11/2019       Assessment & Plan:   Problem List  Items Addressed This Visit    None       No orders of the defined types were placed in this encounter.    Leward Quan, Student-PA

## 2019-06-16 NOTE — Addendum Note (Signed)
Addended by: Lurlean Nanny on: 06/16/2019 12:14 PM   Modules accepted: Orders

## 2019-06-16 NOTE — Addendum Note (Signed)
Addended by: Kris Mouton on: 06/16/2019 04:17 PM   Modules accepted: Orders

## 2019-06-16 NOTE — Progress Notes (Signed)
HPI  Pt presents to the clinic today with c/o urinary urgency, frequency and dysuria. This started 2-3 days ago. She denies blood in her urine, fever, chills, nausea or low back pain. She denies vaginal complaints. She has tried AZO with minimal relief.    Review of Systems  Past Medical History:  Diagnosis Date  . Arthritis   . Bell's palsy may 2012  . Chest pain   . GERD (gastroesophageal reflux disease)   . H/O hiatal hernia   . Hyperlipidemia   . Hypertension   . Hypothyroidism   . Shingles   . Shortness of breath   . Sleep apnea    uses cpap    Family History  Problem Relation Age of Onset  . Heart attack Father 43       deceased  . Diabetes Brother   . Stroke Brother 25       twice  . Stroke Sister 60  . Diabetes Sister   . Heart attack Sister 50  . Heart attack Brother 61    Social History   Socioeconomic History  . Marital status: Married    Spouse name: Not on file  . Number of children: Not on file  . Years of education: Not on file  . Highest education level: Not on file  Occupational History  . Occupation: disabled    Fish farm manager: DISABILITY    Comment: back  Social Needs  . Financial resource strain: Not on file  . Food insecurity    Worry: Not on file    Inability: Not on file  . Transportation needs    Medical: Not on file    Non-medical: Not on file  Tobacco Use  . Smoking status: Current Every Day Smoker    Packs/day: 0.20    Years: 20.00    Pack years: 4.00    Types: Cigarettes    Last attempt to quit: 04/29/2018    Years since quitting: 1.1  . Smokeless tobacco: Never Used  . Tobacco comment: used to smoke 1.5 - 2 PPD, now down to 3-4 cigs a day  Substance and Sexual Activity  . Alcohol use: No  . Drug use: No  . Sexual activity: Not Currently  Lifestyle  . Physical activity    Days per week: Not on file    Minutes per session: Not on file  . Stress: Not on file  Relationships  . Social Herbalist on phone: Not on  file    Gets together: Not on file    Attends religious service: Not on file    Active member of club or organization: Not on file    Attends meetings of clubs or organizations: Not on file    Relationship status: Not on file  . Intimate partner violence    Fear of current or ex partner: Not on file    Emotionally abused: Not on file    Physically abused: Not on file    Forced sexual activity: Not on file  Other Topics Concern  . Not on file  Social History Narrative   Married but she and husband separated, she helps take care of him (he is in poor health)   She lives with her grown son who is mentally disabled   She has been on disability since 2011    No Known Allergies   Constitutional: Denies fever, malaise, fatigue, headache or abrupt weight changes.   GU: Pt reports urgency, frequency and pain with urination.  Denies burning sensation, blood in urine, odor or discharge. Skin: Denies redness, rashes, lesions or ulcercations.   No other specific complaints in a complete review of systems (except as listed in HPI above).    Objective:   Physical Exam  BP 122/76   Pulse 70   Temp 97.7 F (36.5 C) (Temporal)   Wt 249 lb (112.9 kg)   SpO2 98%   BMI 43.42 kg/m  Wt Readings from Last 3 Encounters:  06/16/19 249 lb (112.9 kg)  06/11/19 250 lb 12.8 oz (113.8 kg)  04/22/19 253 lb 14.4 oz (115.2 kg)    General: Appears her stated age, obese, in NAD. Cardiovascular: Normal rate and rhythm. S1,S2 noted.   Pulmonary/Chest: Normal effort and positive vesicular breath sounds. No respiratory distress. No wheezes, rales or ronchi noted.  Abdomen: Soft. Normal bowel sounds. No distention or masses noted.  Tender to palpation over the bladder area. No CVA tenderness.        Assessment & Plan:   Urgency, Frequency, Dysuria:  Urinalysis: 2+ leuks, 2+ blood Will send urine culture eRx sent if for Amoxil 500 mg BID x 5 days (pt requested this by name) OK to take AZO OTC Drink  plenty of fluids  RTC as needed or if symptoms persist. Webb Silversmith, NP

## 2019-06-18 LAB — URINE CULTURE
MICRO NUMBER:: 1054519
SPECIMEN QUALITY:: ADEQUATE

## 2019-06-24 ENCOUNTER — Other Ambulatory Visit: Payer: Self-pay | Admitting: Family Medicine

## 2019-06-24 DIAGNOSIS — I10 Essential (primary) hypertension: Secondary | ICD-10-CM

## 2019-06-30 ENCOUNTER — Encounter: Payer: Self-pay | Admitting: Family Medicine

## 2019-06-30 ENCOUNTER — Telehealth: Payer: Self-pay | Admitting: Family Medicine

## 2019-06-30 ENCOUNTER — Other Ambulatory Visit: Payer: Self-pay | Admitting: Family Medicine

## 2019-06-30 DIAGNOSIS — Z1231 Encounter for screening mammogram for malignant neoplasm of breast: Secondary | ICD-10-CM

## 2019-06-30 NOTE — Telephone Encounter (Signed)
Left message asking pt to call office  Please let pt know her mammogram is scheduled @ Monday 11/03/19 @ 11:40.   [facility] [place] [facility]  Please let pt know  No powders lotions or deoroant the day of appointment  I also mailed letter

## 2019-06-30 NOTE — Telephone Encounter (Signed)
Patient called back Advised of message below

## 2019-07-03 ENCOUNTER — Telehealth: Payer: Self-pay

## 2019-07-03 NOTE — Telephone Encounter (Signed)
Pt said she finished the abx given from 06/16/19 visit and symptoms never completely cleared. Pt has still has frequency of urine with burning upon urination. Pt has no covid symptoms, no travel and no known exposure to + covid. Pt scheduled in office appt with Glenda Chroman FNP on 07/04/19 at 11 AM. FYI to Glenda Chroman FNP.

## 2019-07-04 ENCOUNTER — Encounter: Payer: Self-pay | Admitting: Family Medicine

## 2019-07-04 ENCOUNTER — Ambulatory Visit (INDEPENDENT_AMBULATORY_CARE_PROVIDER_SITE_OTHER): Payer: Medicare HMO | Admitting: Family Medicine

## 2019-07-04 ENCOUNTER — Other Ambulatory Visit: Payer: Self-pay

## 2019-07-04 VITALS — BP 104/66 | HR 70 | Temp 97.7°F | Ht 63.5 in | Wt 248.0 lb

## 2019-07-04 DIAGNOSIS — R300 Dysuria: Secondary | ICD-10-CM | POA: Diagnosis not present

## 2019-07-04 DIAGNOSIS — N3090 Cystitis, unspecified without hematuria: Secondary | ICD-10-CM

## 2019-07-04 DIAGNOSIS — R8290 Unspecified abnormal findings in urine: Secondary | ICD-10-CM | POA: Diagnosis not present

## 2019-07-04 DIAGNOSIS — R350 Frequency of micturition: Secondary | ICD-10-CM

## 2019-07-04 DIAGNOSIS — A5901 Trichomonal vulvovaginitis: Secondary | ICD-10-CM

## 2019-07-04 LAB — POCT URINALYSIS DIPSTICK
Bilirubin, UA: NEGATIVE
Glucose, UA: NEGATIVE
Ketones, UA: NEGATIVE
Nitrite, UA: NEGATIVE
Protein, UA: NEGATIVE
Spec Grav, UA: 1.025 (ref 1.010–1.025)
Urobilinogen, UA: NEGATIVE E.U./dL — AB
pH, UA: 6 (ref 5.0–8.0)

## 2019-07-04 NOTE — Progress Notes (Signed)
Subjective:    Patient ID: Teresa Mcgee, female    DOB: [birth year], 53 y.o.   MRN: [id number]  HPI Chief Complaint  Patient presents with  . Urinary Frequency    From Telephone note: "Pt finished the abx given from 06/16/19 visit and symptoms never completely cleared. Pt has still has frequency of urine with burning upon urination" Pt states that she feels there has been some improvement since last visit. U/A is still abn today. Previous abx completed on 06/21/2019   This is a 53 yo female who presents today with above cc.  Had urinary symptoms earlier this month and had negative culture. Symptoms are intermittent, notices with first am void and urine smells "fishy." Has chronic vaginal discharge since hysterectomy. Some irritation but no burning, itching. Has had BV in past. No abdominal pain, occasional low back pain with prolonged standing.   Past Medical History:  Diagnosis Date  . Arthritis   . Bell's palsy may 2012  . Chest pain   . GERD (gastroesophageal reflux disease)   . H/O hiatal hernia   . Hyperlipidemia   . Hypertension   . Hypothyroidism   . Shingles   . Shortness of breath   . Sleep apnea    uses cpap   Past Surgical History:  Procedure Laterality Date  . CESAREAN SECTION    . cesarian     3x  . PARTIAL HYSTERECTOMY  2000   abnormal uterine bleeding  . THYROIDECTOMY  1998  . TOOTH EXTRACTION    . TUBAL LIGATION     Family History  Problem Relation Age of Onset  . Heart attack Father 9       deceased  . Diabetes Brother   . Stroke Brother 25       twice  . Stroke Sister 81  . Diabetes Sister   . Heart attack Sister 80  . Heart attack Brother 31   Social History   Tobacco Use  . Smoking status: Current Every Day Smoker    Packs/day: 0.20    Years: 20.00    Pack years: 4.00    Types: Cigarettes    Last attempt to quit: 04/29/2018    Years since quitting: 1.1  . Smokeless tobacco: Never Used  . Tobacco comment: used to smoke 1.5 - 2 PPD,  now down to 3-4 cigs a day  Substance Use Topics  . Alcohol use: No  . Drug use: No     Review of Systems Per HPI    Objective:   Physical Exam Physical Exam  Constitutional: She is oriented to person, place, and time. She appears well-developed and well-nourished. No distress.  HENT:  Head: Normocephalic and atraumatic.  Cardiovascular: Normal rate, regular rhythm and normal heart sounds.   Pulmonary/Chest: Effort normal and breath sounds normal.  Abdominal: Soft. She exhibits no distension. There is no tenderness. There is no rebound, no guarding and no CVA tenderness.  Neurological: She is alert and oriented to person, place, and time.  Skin: Skin is warm and dry. She is not diaphoretic.  Psychiatric: She has a normal mood and affect. Her behavior is normal. Judgment and thought content normal.  Vitals reviewed.   BP 104/66 (BP Location: Left Arm, Patient Position: Sitting, Cuff Size: Large)   Pulse 70   Temp 97.7 F (36.5 C) (Temporal)   Ht 5' 3.5" (1.613 m)   Wt 248 lb (112.5 kg)   SpO2 95%   BMI 43.24 kg/m  Wt Readings from Last 3 Encounters:  07/04/19 248 lb (112.5 kg)  06/16/19 249 lb (112.9 kg)  06/11/19 250 lb 12.8 oz (113.8 kg)    Results for orders placed or performed in visit on 07/04/19  Urinalysis Dipstick  Result Value Ref Range   Color, UA gold    Clarity, UA little cloudy    Glucose, UA Negative Negative   Bilirubin, UA neg    Ketones, UA neg    Spec Grav, UA 1.025 1.010 - 1.025   Blood, UA trace    pH, UA 6.0 5.0 - 8.0   Protein, UA Negative Negative   Urobilinogen, UA negative (A) 0.2 or 1.0 E.U./dL   Nitrite, UA neg    Leukocytes, UA Small (1+) (A) Negative   Appearance     Odor         Assessment & Plan:  1. Urinary frequency - intermittent symptoms with abnormal urine, will check urine culture and wet prep (patient to self collect) - she was instructed to increase fluids - Urinalysis Dipstick  2. Burning with urination - see  #1 - Urinalysis Dipstick  -This visit occurred during the SARS-CoV-2 public health emergency.  Safety protocols were in place, including screening questions prior to the visit, additional usage of staff PPE, and extensive cleaning of exam room while observing appropriate contact time as indicated for disinfecting solutions.    Clarene Reamer, FNP-BC  [facility]  07/04/2019 11:31 AM

## 2019-07-04 NOTE — Patient Instructions (Signed)
Good to see you today  I will notify you of urine culture and wet prep results on Monday  Increase your water intake  Have a great Thanksgiving!

## 2019-07-04 NOTE — Addendum Note (Signed)
Addended by: Virl Cagey on: 07/04/2019 11:42 AM   Modules accepted: Orders

## 2019-07-04 NOTE — Telephone Encounter (Signed)
Noted  

## 2019-07-06 LAB — WET PREP BY MOLECULAR PROBE
Candida species: NOT DETECTED
Gardnerella vaginalis: NOT DETECTED
MICRO NUMBER:: 1124211
SPECIMEN QUALITY:: ADEQUATE
Trichomonas vaginosis: DETECTED — AB

## 2019-07-06 LAB — URINE CULTURE
MICRO NUMBER:: 1124207
SPECIMEN QUALITY:: ADEQUATE

## 2019-07-07 MED ORDER — METRONIDAZOLE 500 MG PO TABS: 500.0000 mg | ORAL_TABLET | Freq: Three times a day (TID) | ORAL | 0 refills | Status: DC

## 2019-07-07 MED ORDER — CEPHALEXIN 500 MG PO CAPS: 500.0000 mg | ORAL_CAPSULE | Freq: Three times a day (TID) | ORAL | 0 refills | Status: DC

## 2019-07-07 NOTE — Addendum Note (Signed)
Addended by: Clarene Reamer B on: 07/07/2019 10:30 AM   Modules accepted: Orders

## 2019-07-15 ENCOUNTER — Encounter: Payer: Self-pay | Admitting: Pain Medicine

## 2019-07-15 NOTE — Progress Notes (Signed)
Pain Management Virtual Encounter Note - Virtual Visit via Telephone Telehealth (real-time audio visits between healthcare provider and patient).   Patient's Phone No. & Preferred Pharmacy:  [phone] (home); [phone] (mobile); (Preferred) [phone] No e-mail address on record  [id number] [place] [id number]: [phone]: [phone]   Pre-screening note:  Our staff contacted [name] her an "in person", "face-to-face" appointment versus a telephone encounter. She indicated preferring the telephone encounter, at this time.   Reason for Virtual Visit: COVID-19*  Social distancing based on CDC and AMA recommendations.   I contacted Teresa Blalockon 07/16/2019 via telephone.      I clearly identified myself as FGaspar Cola MD. I verified that I was speaking with the correct person using two identifiers (Name: Teresa Mcgee and date of birth: [birth year].  Advanced Informed Consent I sought verbal advanced consent from Teresa Mcgee. I informed Ms. HScheurichof possible security and privacy concerns, risks, and limitations associated with providing "not-in-person" medical evaluation and management services. I also informed Teresa Mcgee the availability of "in-person" appointments. Finally, I informed her that there would be a charge for the virtual visit and that she could be  personally, fully or partially, financially responsible for it. Teresa Mcgee understanding and agreed to proceed.   Historic Elements   Ms. MWYNNIE PACETTIis a 53y.o. year old, female patient evaluated today after her last encounter by our practice on 04/14/2019. Teresa Mcgee has a past medical history of Arthritis, Bell's palsy (may 2012), Chest pain, GERD (gastroesophageal reflux disease), H/O hiatal hernia, Hyperlipidemia, Hypertension, Hypothyroidism, Shingles,  Shortness of breath, and Sleep apnea. She also  has a past surgical history that includes Thyroidectomy (1998); cesarian; Partial hysterectomy (2000); Cesarean section; Tubal ligation; and Tooth extraction. Teresa Mcgee a current medication list which includes the following prescription(s): albuterol, amlodipine, azelastine, blood glucose meter kit and supplies, cephalexin, [facility] d3, ezetimibe, fluticasone, gabapentin, hydrochlorothiazide, levocetirizine, levothyroxine, magnesium, meloxicam, metformin, metronidazole, olopatadine hcl, oxycodone, oxycodone, oxycodone, refresh celluvisc, tizanidine, accu-chek fastclix lancets, and accu-chek guide. She  reports that she has been smoking cigarettes. She has a 4.00 pack-year smoking history. She has never used smokeless tobacco. She reports that she does not drink alcohol or use drugs. Ms. HSupanhas No Known Allergies.   HPI  Today, she is being contacted for medication management. The patient indicates doing well with the current medication regimen. No adverse reactions or side effects reported to the medications.   Pharmacotherapy Assessment  Analgesic: OxycodoneIR5 mg, 1 tab PO q 8 hrs (PRN) (15 mg/day of oxycodone) MME/day:22.594mday.   Monitoring: Pharmacotherapy: No side-effects or adverse reactions reported.  PMP: PDMP reviewed during this encounter.       Compliance: No problems identified. Effectiveness: Clinically acceptable. Plan: Refer to "POC".  UDS:  Summary  Date Value Ref Range Status  09/16/2018 FINAL  Final    Comment:    ==================================================================== TOXASSURE SELECT 13 (MW) ==================================================================== Test                             Result       Flag       Units Drug Present and Declared for Prescription Verification   Oxycodone  218          EXPECTED   ng/mg creat   Oxymorphone                    388          EXPECTED    ng/mg creat   Noroxycodone                   239          EXPECTED   ng/mg creat   Noroxymorphone                 64           EXPECTED   ng/mg creat    Sources of oxycodone are scheduled prescription medications.    Oxymorphone, noroxycodone, and noroxymorphone are expected    metabolites of oxycodone. Oxymorphone is also available as a    scheduled prescription medication. ==================================================================== Test                      Result    Flag   Units      Ref Range   Creatinine              114              mg/dL      >=20 ==================================================================== Declared Medications:  The flagging and interpretation on this report are based on the  following declared medications.  Unexpected results may arise from  inaccuracies in the declared medications.  **Note: The testing scope of this panel includes these medications:  Oxycodone  **Note: The testing scope of this panel does not include following  reported medications:  Albuterol  Amlodipine (Norvasc)  Calcium  Cetirizine (Zyrtec)  Eye Drop (Refresh)  Ezetimibe (Zetia)  Fluticasone (Flonase)  Gabapentin  Hydrochlorothiazide  Levothyroxine  Meloxicam (Mobic)  Tizanidine  Vitamin D3 ==================================================================== For clinical consultation, please call [phone]. ====================================================================    Laboratory Chemistry Profile (12 mo)  Renal: 03/06/2019: BUN 10; Creatinine, Ser 0.86  Lab Results  Component Value Date   GFR 83.65 03/06/2019   GFRAA >60 05/25/2018   GFRNONAA >60 05/25/2018   Hepatic: 03/06/2019: Albumin 4.4 Lab Results  Component Value Date   AST 113 (H) 03/06/2019   ALT 107 (H) 03/06/2019   Other: 03/06/2019: VITD 34.76 Note: Above Lab results reviewed.  Imaging  DG C-Arm 1-60 Min-No Report Fluoroscopy was utilized by the requesting physician.  No  radiographic  interpretation.    Assessment  The primary encounter diagnosis was Chronic pain syndrome. Diagnoses of Chronic low back pain (Primary Area of Pain) (Bilateral) (L>R) w/o sciatica, Chronic lower extremity pain ( Secondary  Area of  Pain) (Bilateral) (L>R), Chronic neck pain (Tertiary Area of Pain) (Bilateral) (L>R), Chronic musculoskeletal pain, Muscle spasms of neck, Gastroesophageal reflux disease without esophagitis, Neurogenic pain, and Osteoarthritis involving multiple joints were also pertinent to this visit.  Plan of Care  Problem-specific:  No problem-specific Assessment & Plan notes found for this encounter.  I have discontinued Teresa Mcgee. Teresa Mcgee's amoxicillin. I am also having her start on oxyCODONE and oxyCODONE. Additionally, I am having her maintain her Refresh Celluvisc, [facility] D3, albuterol, azelastine, fluticasone, metFORMIN, levothyroxine, blood glucose meter kit and supplies, Accu-Chek FastClix Lancets, Olopatadine HCl, levocetirizine, ezetimibe, Accu-Chek Guide, hydrochlorothiazide, amLODipine, metroNIDAZOLE, cephALEXin, Magnesium, tiZANidine, gabapentin, meloxicam, and oxyCODONE.  Pharmacotherapy (Medications Ordered): Meds ordered this encounter  Medications  . Magnesium 500 MG CAPS  Sig: Take 1 capsule (500 mg total) by mouth 2 (two) times daily at 8 am and 10 pm.    Dispense:  60 capsule    Refill:  11    Fill one day early if pharmacy is closed on scheduled refill date. May substitute for generic if available.  Marland Kitchen tiZANidine (ZANAFLEX) 4 MG tablet    Sig: Take 1 tablet (4 mg total) by mouth every 8 (eight) hours as needed for muscle spasms.    Dispense:  90 tablet    Refill:  5    Fill one day early if pharmacy is closed on scheduled refill date. May substitute for generic if available.  . gabapentin (NEURONTIN) 100 MG capsule    Sig: Take 1 capsule (100 mg total) by mouth 2 (two) times daily AND 3 capsules (300 mg total) at bedtime.    Dispense:  150  capsule    Refill:  5    Fill one day early if pharmacy is closed on scheduled refill date. May substitute for generic if available.  . meloxicam (MOBIC) 15 MG tablet    Sig: Take 1 tablet (15 mg total) by mouth daily.    Dispense:  30 tablet    Refill:  5    Fill one day early if pharmacy is closed on scheduled refill date. May substitute for generic if available.  Marland Kitchen oxyCODONE (OXY IR/ROXICODONE) 5 MG immediate release tablet    Sig: Take 1 tablet (5 mg total) by mouth every 8 (eight) hours as needed for severe pain. Must last 30 days    Dispense:  90 tablet    Refill:  0    Chronic Pain: STOP Act (Not applicable) Fill 1 day early if closed on refill date. Do not fill until: 07/19/2019. To last until: 08/18/2019. Avoid benzodiazepines within 8 hours of opioids  . oxyCODONE (OXY IR/ROXICODONE) 5 MG immediate release tablet    Sig: Take 1 tablet (5 mg total) by mouth every 8 (eight) hours as needed for severe pain. Must last 30 days    Dispense:  90 tablet    Refill:  0    Chronic Pain: STOP Act (Not applicable) Fill 1 day early if closed on refill date. Do not fill until: 08/18/2019. To last until: 09/17/2019. Avoid benzodiazepines within 8 hours of opioids  . oxyCODONE (OXY IR/ROXICODONE) 5 MG immediate release tablet    Sig: Take 1 tablet (5 mg total) by mouth every 8 (eight) hours as needed for severe pain. Must last 30 days    Dispense:  90 tablet    Refill:  0    Chronic Pain: STOP Act (Not applicable) Fill 1 day early if closed on refill date. Do not fill until: 09/17/2019. To last until: 10/17/2019. Avoid benzodiazepines within 8 hours of opioids   Orders:  No orders of the defined types were placed in this encounter.  Follow-up plan:   Return in about 13 weeks (around 10/15/2019) for (VV), (MM).      Interventional management options:  Considering: NOTE: NO RFA until BMI <35  Possible bilateral lumbar facet RFA Diagnosticleft-sided sacroiliac joint block Possible left-sided  sacroiliac joint RFA Diagnostic bilateral IA hip joint injection Diagnostic bilateral femoral nerve + obturator NB Possible bilateral femoral nerve + obturator nerve RFA Diagnostic left-sided LESI Diagnostic bilateral transforaminal ESI Diagnostic left-sided CESI Diagnostic bilateral cervical facet block Possible bilateral cervical facet RFA Diagnostic bilateralIAknee joint injectionswith local anesthetic and steroid Possible series of 5 bilateral intra-articular Hyalgan knee  injections Diagnostic bilateral genicular nerve blocks Possible bilateral genicular nerve RFA Diagnostic trigger point injections   Palliative PRN treatment(s): Palliativebilateral lumbar facet block    Recent Visits No visits were found meeting these conditions.  Showing recent visits within past 90 days and meeting all other requirements   Today's Visits Date Type Provider Dept  07/16/19 Telemedicine Milinda Pointer, MD [facility]  Showing today's visits and meeting all other requirements   Future Appointments No visits were found meeting these conditions.  Showing future appointments within next 90 days and meeting all other requirements   I discussed the assessment and treatment plan with the patient. The patient was provided an opportunity to ask questions and all were answered. The patient agreed with the plan and demonstrated an understanding of the instructions.  Patient advised to call back or seek an in-person evaluation if the symptoms or condition worsens.  Total duration of non-face-to-face encounter: 15 minutes.  Note by: Gaspar Cola, MD Date: 07/16/2019; Time: 2:37 PM  Note: This dictation was prepared with Dragon dictation. Any transcriptional errors that may result from this process are unintentional.  Disclaimer:  * Given the special circumstances of the COVID-19 pandemic, the federal government has announced that the Office for Civil Rights  (OCR) will exercise its enforcement discretion and will not impose penalties on physicians using telehealth in the event of noncompliance with regulatory requirements under the [place] and [place] (HIPAA) in connection with the good faith provision of telehealth during the OERQS-12 national public health emergency. ([place])

## 2019-07-16 ENCOUNTER — Other Ambulatory Visit: Payer: Self-pay

## 2019-07-16 ENCOUNTER — Ambulatory Visit: Payer: Medicare HMO | Attending: Pain Medicine | Admitting: Pain Medicine

## 2019-07-16 DIAGNOSIS — M542 Cervicalgia: Secondary | ICD-10-CM | POA: Diagnosis not present

## 2019-07-16 DIAGNOSIS — M792 Neuralgia and neuritis, unspecified: Secondary | ICD-10-CM

## 2019-07-16 DIAGNOSIS — G8929 Other chronic pain: Secondary | ICD-10-CM

## 2019-07-16 DIAGNOSIS — M545 Low back pain: Secondary | ICD-10-CM

## 2019-07-16 DIAGNOSIS — M79604 Pain in right leg: Secondary | ICD-10-CM | POA: Diagnosis not present

## 2019-07-16 DIAGNOSIS — G894 Chronic pain syndrome: Secondary | ICD-10-CM

## 2019-07-16 DIAGNOSIS — M8949 Other hypertrophic osteoarthropathy, multiple sites: Secondary | ICD-10-CM

## 2019-07-16 DIAGNOSIS — K219 Gastro-esophageal reflux disease without esophagitis: Secondary | ICD-10-CM

## 2019-07-16 DIAGNOSIS — M159 Polyosteoarthritis, unspecified: Secondary | ICD-10-CM

## 2019-07-16 DIAGNOSIS — M79605 Pain in left leg: Secondary | ICD-10-CM

## 2019-07-16 DIAGNOSIS — M7918 Myalgia, other site: Secondary | ICD-10-CM

## 2019-07-16 DIAGNOSIS — M62838 Other muscle spasm: Secondary | ICD-10-CM

## 2019-07-16 MED ORDER — MAGNESIUM 500 MG PO CAPS: 500.0000 mg | ORAL_CAPSULE | Freq: Two times a day (BID) | ORAL | 11 refills | Status: DC

## 2019-07-16 MED ORDER — OXYCODONE HCL 5 MG PO TABS: 5.0000 mg | ORAL_TABLET | Freq: Three times a day (TID) | ORAL | 0 refills | Status: DC | PRN

## 2019-07-16 MED ORDER — GABAPENTIN 100 MG PO CAPS: ORAL_CAPSULE | ORAL | 5 refills | Status: DC

## 2019-07-16 MED ORDER — MELOXICAM 15 MG PO TABS: 15.0000 mg | ORAL_TABLET | Freq: Every day | ORAL | 5 refills | Status: DC

## 2019-07-16 MED ORDER — TIZANIDINE HCL 4 MG PO TABS: 4.0000 mg | ORAL_TABLET | Freq: Three times a day (TID) | ORAL | 5 refills | Status: DC | PRN

## 2019-07-17 ENCOUNTER — Other Ambulatory Visit: Payer: Self-pay | Admitting: Cardiovascular Disease

## 2019-07-17 NOTE — Telephone Encounter (Signed)
Please schedule F/U appointment for refills. Thank you! °

## 2019-07-19 ENCOUNTER — Other Ambulatory Visit: Payer: Self-pay | Admitting: Family Medicine

## 2019-07-19 DIAGNOSIS — E039 Hypothyroidism, unspecified: Secondary | ICD-10-CM

## 2019-07-22 ENCOUNTER — Telehealth: Payer: Self-pay | Admitting: Family Medicine

## 2019-07-22 MED ORDER — EZETIMIBE 10 MG PO TABS: 10.0000 mg | ORAL_TABLET | Freq: Every day | ORAL | 0 refills | Status: DC

## 2019-07-22 NOTE — Telephone Encounter (Signed)
Noted, refill sent to pharmacy. 

## 2019-07-22 NOTE — Telephone Encounter (Signed)
Patient is calling to request a medication  She stated that she is needing her EZETIMIBE 10mg  This was previously prescribed by another provider but because the patient has not been seen by that doctor in so long they are not able to refill this and they advised her to call her primary    Patient has 6 tablets left  [facility] [address] [employer]

## 2019-07-22 NOTE — Telephone Encounter (Signed)
Patient declined to schedule F/U appointment. She states she will contact her PCP for refill.

## 2019-08-19 ENCOUNTER — Encounter: Payer: Medicare HMO | Attending: Family Medicine | Admitting: Dietician

## 2019-08-19 ENCOUNTER — Other Ambulatory Visit: Payer: Self-pay

## 2019-08-19 ENCOUNTER — Encounter: Payer: Self-pay | Admitting: Dietician

## 2019-08-19 VITALS — Ht 63.0 in | Wt 247.3 lb

## 2019-08-19 DIAGNOSIS — G8929 Other chronic pain: Secondary | ICD-10-CM

## 2019-08-19 DIAGNOSIS — Z713 Dietary counseling and surveillance: Secondary | ICD-10-CM | POA: Diagnosis not present

## 2019-08-19 DIAGNOSIS — Z6841 Body Mass Index (BMI) 40.0 and over, adult: Secondary | ICD-10-CM | POA: Diagnosis not present

## 2019-08-19 DIAGNOSIS — M1990 Unspecified osteoarthritis, unspecified site: Secondary | ICD-10-CM | POA: Diagnosis not present

## 2019-08-19 DIAGNOSIS — M545 Low back pain: Secondary | ICD-10-CM | POA: Insufficient documentation

## 2019-08-19 DIAGNOSIS — M5442 Lumbago with sciatica, left side: Secondary | ICD-10-CM

## 2019-08-19 NOTE — Progress Notes (Signed)
Medical Nutrition Therapy: Visit start time: 1110  end time: 1140  Assessment:  Diagnosis: obesity, Type 2 diabetes Medical history changes: no changes Psychosocial issues/ stress concerns: none  Current weight: 247.3lbs Height: 5'3" Medications, supplement changes: none  Progress and evaluation:  . Weight loss of 6.6lbs since previous visit on 04/22/19.  Had HbA1C tested on 06/11/19, with result of 6.8% (down from 11% on 03/06/19) . Patient continues to reduce intake of sugar/ sweets/ sugary beverages, and is limiting snacks to fruit and nuts.  . She is eating 2 meals daily due to her sleep schedule (12 or 1am - 9-11am).  . She is working to increase daily exercise.    Physical activity: indoor walking (in stores) daily; trying to find walking partner to walk outdoors or in shopping mall  Dietary Intake:  Usual eating pattern includes 2 meals and 1 snacks per day. Dining out frequency: not assessed today.  Breakfast/ lunch: Snack: none Lunch: 1pm -- sandwich (white wheat bread) + fruit; orange and hot chocolate with milk; crackers with [place] salad; occ eggs and toast Snack: none Supper: 8pm-- 1/3 field peas and greens; 1/4 stewed beef and rice + collard greens; grilled chicken Snack: apple, orange, or nuts Beverages: water several bottles daily; sugar free lemonade flavoring; regular pepsi 1 - 1.5 16oz bottle daily  Nutrition Care Education: Topics covered:  Basic nutrition: appropriate nutrient balance, appropriate meal and snack schedule    Weight control: reviewed progress since previous visit, determining reasonable weight loss rate-- patient is comfortable with losing 1-3lbs per month; she does not want to lose weight quickly in order to avoid loose skin. Discussed options for exercise. Diabetes: reviewed -- appropriate meal and snack schedule, appropriate carb intake and balance, healthy carb choices for lower glycemic response, role of fiber, protein Other lifestyle changes:   benefits of making changes   Nutritional Diagnosis:  [place]-2.2 Altered nutrition-related laboratory As related to type 2 diabetes.  As evidenced by patient with recent HbA1C of 6.8%. [place]-3.3 Overweight/obesity As related to history of excess calories and inadequate physical activity.  As evidenced by patient with curent BMI of 43.81, following eating pattern to promote ongoing weight loss.  Intervention:  . Instruction and discussion as noted above. . Commended patient for maintaining healthy habits throughout holiday season and success with ongoing weight loss. . Encouraged effort to include protein sources with all meals and snacks given 2-meal eating pattern.  Marland Kitchen Updated goals with input from patient. . No further follow-up scheduled at this time.   Education Materials given:  Marland Kitchen Exercise booklet for diabetes . Goals/ instructions   Learner/ who was taught:  . Patient   Level of understanding: Marland Kitchen Verbalizes/ demonstrates competency   Demonstrated degree of understanding via:   Teach back Learning barriers: . None  Willingness to learn/ readiness for change: . Eager, change in progress  Monitoring and Evaluation:  Dietary intake, exercise, BG control, and body weight      follow up: prn

## 2019-08-19 NOTE — Patient Instructions (Signed)
   Great job making healthy food choices, keep it up!  Remember to include a protein food with each meal to help keep blood sugar steady.   Plan to eat a meal or snack every 3-5 hours while you are awake.

## 2019-08-20 ENCOUNTER — Encounter: Payer: Self-pay | Admitting: Primary Care

## 2019-08-20 ENCOUNTER — Ambulatory Visit (INDEPENDENT_AMBULATORY_CARE_PROVIDER_SITE_OTHER): Payer: Medicare HMO | Admitting: Primary Care

## 2019-08-20 DIAGNOSIS — Z9989 Dependence on other enabling machines and devices: Secondary | ICD-10-CM

## 2019-08-20 DIAGNOSIS — G4733 Obstructive sleep apnea (adult) (pediatric): Secondary | ICD-10-CM

## 2019-08-20 NOTE — Patient Instructions (Addendum)
Pleasure speaking with you today Teresa Mcgee  Recommendations: - Continue to wear CPAP every night 4-6 hours or more  - Do not drive if experiencing excessive daytime fatigue or somnolence  Tobacco abuse - Continue to work on cutting down amount you smoke and then pick quit date when you are ready   Follow-up - 1 year with Dr. Mortimer Fries or Halford Chessman Or sooner if needed     CPAP and BPAP Information CPAP and BPAP are methods of helping a person breathe with the use of air pressure. CPAP stands for "continuous positive airway pressure." BPAP stands for "bi-level positive airway pressure." In both methods, air is blown through your nose or mouth and into your air passages to help you breathe well. CPAP and BPAP use different amounts of pressure to blow air. With CPAP, the amount of pressure stays the same while you breathe in and out. With BPAP, the amount of pressure is increased when you breathe in (inhale) so that you can take larger breaths. Your health care provider will recommend whether CPAP or BPAP would be more helpful for you. Why are CPAP and BPAP treatments used? CPAP or BPAP can be helpful if you have:  Sleep apnea.  Chronic obstructive pulmonary disease (COPD).  Heart failure.  Medical conditions that weaken the muscles of the chest including muscular dystrophy, or neurological diseases such as amyotrophic lateral sclerosis (ALS).  Other problems that cause breathing to be weak, abnormal, or difficult. CPAP is most commonly used for obstructive sleep apnea (OSA) to keep the airways from collapsing when the muscles relax during sleep. How is CPAP or BPAP administered? Both CPAP and BPAP are provided by a small machine with a flexible plastic tube that attaches to a plastic mask. You wear the mask. Air is blown through the mask into your nose or mouth. The amount of pressure that is used to blow the air can be adjusted on the machine. Your health care provider will determine the pressure  setting that should be used based on your individual needs. When should CPAP or BPAP be used? In most cases, the mask only needs to be worn during sleep. Generally, the mask needs to be worn throughout the night and during any daytime naps. People with certain medical conditions may also need to wear the mask at other times when they are awake. Follow instructions from your health care provider about when to use the machine. What are some tips for using the mask?   Because the mask needs to be snug, some people feel trapped or closed-in (claustrophobic) when first using the mask. If you feel this way, you may need to get used to the mask. One way to do this is by holding the mask loosely over your nose or mouth and then gradually applying the mask more snugly. You can also gradually increase the amount of time that you use the mask.  Masks are available in various types and sizes. Some fit over your mouth and nose while others fit over just your nose. If your mask does not fit well, talk with your health care provider about getting a different one.  If you are using a mask that fits over your nose and you tend to breathe through your mouth, a chin strap may be applied to help keep your mouth closed.  The CPAP and BPAP machines have alarms that may sound if the mask comes off or develops a leak.  If you have trouble with the mask,  it is very important that you talk with your health care provider about finding a way to make the mask easier to tolerate. Do not stop using the mask. Stopping the use of the mask could have a negative impact on your health. What are some tips for using the machine?  Place your CPAP or BPAP machine on a secure table or stand near an electrical outlet.  Know where the on/off switch is located on the machine.  Follow instructions from your health care provider about how to set the pressure on your machine and when you should use it.  Do not eat or drink while the CPAP  or BPAP machine is on. Food or fluids could get pushed into your lungs by the pressure of the CPAP or BPAP.  Do not smoke. Tobacco smoke residue can damage the machine.  For home use, CPAP and BPAP machines can be rented or purchased through home health care companies. Many different brands of machines are available. Renting a machine before purchasing may help you find out which particular machine works well for you.  Keep the CPAP or BPAP machine and attachments clean. Ask your health care provider for specific instructions. Get help right away if:  You have redness or open areas around your nose or mouth where the mask fits.  You have trouble using the CPAP or BPAP machine.  You cannot tolerate wearing the CPAP or BPAP mask.  You have pain, discomfort, and bloating in your abdomen. Summary  CPAP and BPAP are methods of helping a person breathe with the use of air pressure.  Both CPAP and BPAP are provided by a small machine with a flexible plastic tube that attaches to a plastic mask.  If you have trouble with the mask, it is very important that you talk with your health care provider about finding a way to make the mask easier to tolerate. This information is not intended to replace advice given to you by your health care provider. Make sure you discuss any questions you have with your health care provider. Document Revised: 11/20/2018 Document Reviewed: 06/19/2016 Elsevier Patient Education  [place].

## 2019-08-20 NOTE — Progress Notes (Signed)
Virtual Visit via Telephone Note  I connected with Teresa Mcgee on 08/20/19 at 11:00 AM EST by telephone and verified that I am speaking with the correct person using two identifiers.  Location: Patient: Home Provider: [place] office   I discussed the limitations, risks, security and privacy concerns of performing an evaluation and management service by telephone and the availability of in person appointments. I also discussed with the patient that there may be a patient responsible charge related to this service. The patient expressed understanding and agreed to proceed.   History of Present Illness: 54 year old female, current every day smoker. PMH significant for OSA. Patient of Dr. Ashby Dawes, last seen on 08/21/18. Maintained on auto CPAP 10-20cm h20.   08/20/2019 Patient contacted today for televisit 1 year OSA follow-up. Compliant with CPAP use. She reports no issues with pressure setting or mask fit. Feels well rested in the morning. She has not needed to use her ventolin rescue inhaler in 1 month. Actively trying to lose weight, she has lost >24lbs with the help of a nutritionist. She treated herself for christmas to a new outfit. She is still smoking 4-5 cigarettes daily.   [facility] download 07/19/19- 08/16/18: 30/30 (100%); 83% >4 hours Average use 6 hours 5 mins  Pressure 10-20 cm h20 (19- 95%) AHI 2.1   Observations/Objective:  - No shortness of breath, wheezing or cough noted during phone conversation  Assessment and Plan:  OSA: - Compliant with CPAP and reports benefit from use - Pressure 10-20 cm h20; AHI 2.1 - No changes - Continue to wear every night for 4-6 hours or more - Advised not to drive if experiencing excessive daytime fatigue or somnolence  - Continue to work on weight loss - Renew CPAP and supplies with DME   Tobacco abuse: - Started smoking at age 32. She smoked 1.5-2ppd x 3 years - She is still smoking 4-5 cigarettes daily - Smoking cessation  reviewed at length, patient is not ready to quit completely   Follow Up Instructions:   - FU in 1 year with Dr. Mortimer Fries  I discussed the assessment and treatment plan with the patient. The patient was provided an opportunity to ask questions and all were answered. The patient agreed with the plan and demonstrated an understanding of the instructions.   The patient was advised to call back or seek an in-person evaluation if the symptoms worsen or if the condition fails to improve as anticipated.  I provided 18 minutes of non-face-to-face time during this encounter.   Martyn Ehrich, NP

## 2019-08-21 NOTE — Progress Notes (Signed)
Reviewed and agree with assessment/plan.   Chabely Norby, MD [facility] 08/09/2016, 12:24 PM Pager:  [phone]  

## 2019-08-24 ENCOUNTER — Other Ambulatory Visit: Payer: Self-pay | Admitting: Family Medicine

## 2019-08-24 ENCOUNTER — Other Ambulatory Visit: Payer: Self-pay | Admitting: Primary Care

## 2019-08-26 NOTE — Telephone Encounter (Signed)
Last prescribed on 07/22/2019 #90 with 0 refill by Allie Bossier. Last appointment on 07/04/2019 (acute). Next future appointment on 10/15/2019

## 2019-08-29 ENCOUNTER — Other Ambulatory Visit: Payer: Self-pay | Admitting: Family Medicine

## 2019-08-29 ENCOUNTER — Other Ambulatory Visit: Payer: Self-pay | Admitting: Primary Care

## 2019-09-08 ENCOUNTER — Other Ambulatory Visit: Payer: Self-pay

## 2019-09-08 ENCOUNTER — Encounter: Payer: Self-pay | Admitting: Podiatry

## 2019-09-08 ENCOUNTER — Ambulatory Visit (INDEPENDENT_AMBULATORY_CARE_PROVIDER_SITE_OTHER): Payer: Medicare HMO | Admitting: Podiatry

## 2019-09-08 DIAGNOSIS — R202 Paresthesia of skin: Secondary | ICD-10-CM

## 2019-09-08 DIAGNOSIS — B351 Tinea unguium: Secondary | ICD-10-CM | POA: Diagnosis not present

## 2019-09-08 DIAGNOSIS — R200 Anesthesia of skin: Secondary | ICD-10-CM

## 2019-09-08 DIAGNOSIS — M79674 Pain in right toe(s): Secondary | ICD-10-CM

## 2019-09-08 DIAGNOSIS — M792 Neuralgia and neuritis, unspecified: Secondary | ICD-10-CM | POA: Diagnosis not present

## 2019-09-08 DIAGNOSIS — M79675 Pain in left toe(s): Secondary | ICD-10-CM

## 2019-09-08 NOTE — Progress Notes (Signed)
Complaint:  Visit Type: Patient returns to my office for continued preventative foot care services. Complaint: Patient states" my nails have grown long and thick and become painful to walk and wear shoes" Patient has been diagnosed with neuropathy for which she takes gabapentin.. The patient presents for preventative foot care services. No changes to ROS  Podiatric Exam: Vascular: dorsalis pedis and posterior tibial pulses are palpable bilateral. Capillary return is immediate. Temperature gradient is WNL. Skin turgor WNL  Sensorium: Normal Semmes Weinstein monofilament test. Normal tactile sensation bilaterally. Nail Exam: Pt has thick disfigured discolored nails with subungual debris noted bilateral entire nail hallux through fifth toenails Ulcer Exam: There is no evidence of ulcer or pre-ulcerative changes or infection. Orthopedic Exam: Muscle tone and strength are WNL. No limitations in general ROM. No crepitus or effusions noted. Foot type and digits show no abnormalities. Bony prominences are unremarkable. Skin: No Porokeratosis. No infection or ulcers  Diagnosis:  Onychomycosis, , Pain in right toe, pain in left toes  Treatment & Plan Procedures and Treatment: Consent by patient was obtained for treatment procedures.   Debridement of mycotic and hypertrophic toenails, 1 through 5 bilateral and clearing of subungual debris. No ulceration, no infection noted.  Return Visit-Office Procedure: Patient instructed to return to the office for a follow up visit 3 months for continued evaluation and treatment.    Gardiner Barefoot DPM

## 2019-10-08 ENCOUNTER — Other Ambulatory Visit: Payer: Self-pay | Admitting: Family Medicine

## 2019-10-14 ENCOUNTER — Encounter: Payer: Self-pay | Admitting: Pain Medicine

## 2019-10-14 MED ORDER — OXYCODONE HCL 5 MG PO TABS: 5.0000 mg | ORAL_TABLET | Freq: Three times a day (TID) | ORAL | 0 refills | Status: DC | PRN

## 2019-10-14 NOTE — Progress Notes (Signed)
Patient: Teresa Mcgee  Service Category: E/M  Provider: Gaspar Cola, MD  DOB: 1966-04-15  DOS: 10/15/2019  Location: Office  MRN: [id number]  Setting: Ambulatory outpatient  Referring Provider: Elby Beck, FNP  Type: Established Patient  Specialty: Interventional Pain Management  PCP: Elby Beck, FNP  Location: Remote location  Delivery: TeleHealth     Virtual Mcgee - Pain Management PROVIDER NOTE: Information contained herein reflects review and annotations entered in association with Mcgee. Interpretation of such information and data should be left to medically-trained personnel. Information provided to patient can be located elsewhere in the medical record under "Patient Instructions". Document created using STT-dictation technology, any transcriptional errors that may result from process are unintentional.    Contact & Pharmacy Preferred: [phone] Home: [phone] (home) Mobile: [phone] (mobile) E-mail: No e-mail address on record  [id number] [id number]: [phone]: [phone]  Pre-screening  Teresa Mcgee. She indicated preferring virtual for this Mcgee.   Reason COVID-19*  Social distancing based on CDC and AMA recommendations.   I contacted Teresa Mcgee 10/15/2019 via telephone.      I clearly identified myself as FGaspar Cola MD. I verified that I was speaking with the correct person using two identifiers (Name: Teresa Mcgee and date of birth: 104/28/1967.  Consent I sought verbal advanced consent from Teresa Mcgee virtual visit Mcgee. I informed Teresa Mcgee, risks, and limitations associated with providing "not-in-person" medical evaluation and management services. I also informed Teresa Mcgee the availability of "in-person"  Mcgee. Finally, I informed her that there would be a charge for the virtual visit and that she could be  personally, fully or partially, financially responsible for it. [name] and agreed to proceed.   Historic Elements   [name], female patient evaluated today after her last contact with our practice on Visit date not found. [name] of Arthritis, Bell's palsy (may 2012), Chest pain, GERD (gastroesophageal reflux disease), H/O hiatal hernia, Hyperlipidemia, Hypertension, Hypothyroidism, Shingles, Shortness of breath, and Sleep apnea. She also  has a past surgical history that includes Thyroidectomy (1998); cesarian; Partial hysterectomy (2000); Cesarean section; Tubal ligation; and Tooth extraction. [name] a current medication list which includes the following prescription(s): accu-chek fastclix lancets, accu-chek guide, amlodipine, azelastine, blood glucose meter kit and supplies, [facility] d3, ezetimibe, fluticasone, gabapentin, hydrochlorothiazide, levocetirizine, levothyroxine, magnesium, meloxicam, metformin, olopatadine hcl, [START ON 10/17/2019] oxycodone, [START ON 11/16/2019] oxycodone, [START ON 12/16/2019] oxycodone, refresh celluvisc, tizanidine, and ventolin hfa. She  reports that she has been smoking cigarettes. She has a 40.00 pack-year smoking history. She has never used smokeless tobacco. She reports that she does not drink alcohol or use drugs. [name] No Known Allergies.   HPI  Today, she is being contacted for medication management. The patient indicates doing well with the current medication regimen. No adverse reactions or side effects reported to the medications.   Pharmacotherapy Assessment  Analgesic: OxycodoneIR5 mg, 1 tab PO q 8 hrs (PRN) (15 mg/day of oxycodone) MME/day:22.575mday.   Monitoring: Teresa Mcgee PMP: PDMP reviewed during this Mcgee.       Pharmacotherapy: No  side-effects or adverse reactions reported. Compliance: No problems identified. Effectiveness: Clinically acceptable. Plan: Refer to "POC".  UDS:  Summary  Date Value Ref Range Status  09/16/2018 FINAL  Final    Comment:    ==================================================================== TOXASSURE SELECT 13 (MW) ==================================================================== Test                             Result       Flag       Units Drug Present and Declared for Prescription Verification   Oxycodone                      218          EXPECTED   ng/mg creat   Oxymorphone                    388          EXPECTED   ng/mg creat   Noroxycodone                   239          EXPECTED   ng/mg creat   Noroxymorphone                 64           EXPECTED   ng/mg creat    Sources of oxycodone are scheduled prescription medications.    Oxymorphone, noroxycodone, and noroxymorphone are expected    metabolites of oxycodone. Oxymorphone is also available as a    scheduled prescription medication. ==================================================================== Test                      Result    Flag   Units      Ref Range   Creatinine              114              mg/dL      >=20 ==================================================================== Declared Medications:  The flagging and interpretation on this report are based on the  following declared medications.  Unexpected results may arise from  inaccuracies in the declared medications.  **Note: The testing scope of this panel includes these medications:  Oxycodone  **Note: The testing scope of this panel does not include following  reported medications:  Albuterol  Amlodipine (Norvasc)  Calcium  Cetirizine (Zyrtec)  Eye Drop (Refresh)  Ezetimibe (Zetia)  Fluticasone (Flonase)  Gabapentin  Hydrochlorothiazide  Levothyroxine  Meloxicam (Mobic)  Tizanidine  Vitamin  D3 ==================================================================== For clinical consultation, please call [phone]. ====================================================================    Laboratory Chemistry Profile   Renal Lab Results  Component Value Date   BUN 10 03/06/2019   CREATININE 0.86 03/06/2019   BCR 8 (L) 01/24/2018   GFR 83.65 03/06/2019   GFRAA >60 05/25/2018   GFRNONAA >60 05/25/2018    Hepatic Lab Results  Component Value Date   AST 113 (H) 03/06/2019   ALT 107 (H) 03/06/2019   ALBUMIN 4.4 03/06/2019   ALKPHOS 76 03/06/2019   LIPASE 25 05/25/2018    Electrolytes Lab Results  Component Value Date   NA 135 03/06/2019   K 3.6 03/06/2019   CL 96 03/06/2019   CALCIUM 10.1 03/06/2019   MG 2.0 01/24/2018    Bone Lab Results  Component Value Date   VD25OH 34.76 03/06/2019   25OHVITD1 9.3 (L) 01/24/2018   25OHVITD2 <1.0 01/24/2018   25OHVITD3 9.3 01/24/2018    Inflammation (CRP: Acute Phase) (ESR: Chronic Phase) Lab Results  Component Value Date   CRP 5.5 (H) 01/24/2018   ESRSEDRATE 40 01/24/2018      Note: Above Lab results reviewed.  Imaging  DG C-Arm 1-60 Min-No Report Fluoroscopy was utilized by the requesting physician.  No radiographic  interpretation.   Assessment  The primary Mcgee diagnosis was Chronic pain syndrome. Diagnoses of Chronic low back pain (Primary Area of Pain) (Bilateral) (L>R) w/o sciatica and Chronic lower extremity pain ( Secondary  Area of  Pain) (Bilateral) (L>R) were also pertinent to this visit.  Plan of Care  Problem-specific:  No problem-specific Assessment & Plan notes found for this Mcgee.  Ms. LANECIA SLIVA has a current medication list which includes the following long-term medication(s): amlodipine, ezetimibe, fluticasone, gabapentin, hydrochlorothiazide, levocetirizine, levothyroxine, magnesium, meloxicam, metformin, [START ON 10/17/2019] oxycodone, [START ON 11/16/2019] oxycodone, [START ON  12/16/2019] oxycodone, tizanidine, and ventolin hfa.  Pharmacotherapy (Medications Ordered): Meds ordered this Mcgee  Medications  . oxyCODONE (OXY IR/ROXICODONE) 5 MG immediate release tablet    Sig: Take 1 tablet (5 mg total) by mouth every 8 (eight) hours as needed for severe pain. Must last 30 days    Dispense:  90 tablet    Refill:  0    Chronic Pain: STOP Act (Not applicable) Fill 1 day early if closed on refill date. Do not fill until: 10/17/2019. To last until: 11/16/2019. Avoid benzodiazepines within 8 hours of opioids  . oxyCODONE (OXY IR/ROXICODONE) 5 MG immediate release tablet    Sig: Take 1 tablet (5 mg total) by mouth every 8 (eight) hours as needed for severe pain. Must last 30 days    Dispense:  90 tablet    Refill:  0    Chronic Pain: STOP Act (Not applicable) Fill 1 day early if closed on refill date. Do not fill until: 11/16/2019. To last until: 12/16/2019. Avoid benzodiazepines within 8 hours of opioids  . oxyCODONE (OXY IR/ROXICODONE) 5 MG immediate release tablet    Sig: Take 1 tablet (5 mg total) by mouth every 8 (eight) hours as needed for severe pain. Must last 30 days    Dispense:  90 tablet    Refill:  0    Chronic Pain: STOP Act (Not applicable) Fill 1 day early if closed on refill date. Do not fill until: 12/16/2019. To last until: 01/15/2020. Avoid benzodiazepines within 8 hours of opioids   Orders:  No orders of the defined types were placed in this Mcgee.  Follow-up plan:   Return in about 13 weeks (around 01/14/2020) for (VV), (MM).      Interventional management options:  Considering: NOTE: NO RFA until BMI <35  Possible bilateral lumbar facet RFA Diagnosticleft-sided sacroiliac joint block Possible left-sided sacroiliac joint RFA Diagnostic bilateral IA hip joint injection Diagnostic bilateral femoral nerve + obturator NB Possible bilateral femoral nerve + obturator nerve RFA Diagnostic left-sided LESI Diagnostic bilateral transforaminal  ESI Diagnostic left-sided CESI Diagnostic bilateral cervical facet block Possible bilateral cervical facet RFA Diagnostic bilateralIAknee joint injectionswith local anesthetic and steroid Possible series of 5 bilateral intra-articular Hyalgan knee injections Diagnostic bilateral genicular nerve blocks Possible bilateral genicular nerve RFA Diagnostic trigger point injections   Palliative PRN treatment(s): Palliativebilateral lumbar facet block     Recent Visits No visits were found meeting these conditions.  Showing recent visits within past 90 days and meeting all other requirements   Today's Visits Date Type Provider Dept  10/15/19 Telemedicine Milinda Pointer, MD [facility]  Showing today's visits and meeting all other  requirements   Future Mcgee No visits were found meeting these conditions.  Showing future Mcgee within next 90 days and meeting all other requirements   I discussed the assessment and treatment plan with the patient. The patient was provided an opportunity to ask questions and all were answered. The patient agreed with the plan and demonstrated an understanding of the instructions.  Patient advised to call back or seek an in-person evaluation if the symptoms or condition worsens.  Duration of Mcgee: 16 minutes.  Note by: Gaspar Cola, MD Date: 10/15/2019; Time: 8:13 AM

## 2019-10-15 ENCOUNTER — Other Ambulatory Visit: Payer: Self-pay

## 2019-10-15 ENCOUNTER — Ambulatory Visit (INDEPENDENT_AMBULATORY_CARE_PROVIDER_SITE_OTHER): Payer: Medicare HMO | Admitting: Family Medicine

## 2019-10-15 ENCOUNTER — Encounter: Payer: Self-pay | Admitting: Family Medicine

## 2019-10-15 ENCOUNTER — Ambulatory Visit: Payer: Medicare HMO | Attending: Pain Medicine | Admitting: Pain Medicine

## 2019-10-15 VITALS — BP 104/72 | HR 70 | Temp 97.9°F | Ht 63.0 in | Wt 247.7 lb

## 2019-10-15 DIAGNOSIS — M79604 Pain in right leg: Secondary | ICD-10-CM

## 2019-10-15 DIAGNOSIS — Z720 Tobacco use: Secondary | ICD-10-CM

## 2019-10-15 DIAGNOSIS — E119 Type 2 diabetes mellitus without complications: Secondary | ICD-10-CM | POA: Diagnosis not present

## 2019-10-15 DIAGNOSIS — G894 Chronic pain syndrome: Secondary | ICD-10-CM | POA: Diagnosis not present

## 2019-10-15 DIAGNOSIS — M545 Low back pain, unspecified: Secondary | ICD-10-CM

## 2019-10-15 DIAGNOSIS — G8929 Other chronic pain: Secondary | ICD-10-CM | POA: Diagnosis not present

## 2019-10-15 DIAGNOSIS — M79605 Pain in left leg: Secondary | ICD-10-CM

## 2019-10-15 DIAGNOSIS — Z1211 Encounter for screening for malignant neoplasm of colon: Secondary | ICD-10-CM

## 2019-10-15 LAB — LIPID PANEL
Cholesterol: 188 mg/dL (ref 0–200)
HDL: 43.5 mg/dL (ref >39.00)
LDL Cholesterol: 111 mg/dL — ABNORMAL HIGH (ref 0–99)
NonHDL: 144.66
Total CHOL/HDL Ratio: 4
Triglycerides: 166 mg/dL — ABNORMAL HIGH (ref 0.0–149.0)
VLDL: 33.2 mg/dL (ref 0.0–40.0)

## 2019-10-15 LAB — HEMOGLOBIN A1C: Hgb A1c MFr Bld: 6 % (ref 4.6–6.5)

## 2019-10-15 NOTE — Progress Notes (Signed)
   Subjective:    Patient ID: Teresa Mcgee, female    DOB: [birth year], 54 y.o.   MRN: [id number]  HPI This is a 54 yo female who presents today for follow up of chronic medical conditions.  Doing ok. Plans to get Covid vaccine when available to her.   DM type 2- does not check blood sugars at home. Weight stable.   Chronic pain- continues to follow up with pain management. Pain bearable.   Colon cancer screening- reports that she did Cologuard in past, would like to do again.   Tobacco abuse- smokes 1/2 ppd, not interested in quitting at this time.     Review of Systems No chest pain, SOB, abdominal pain, dysuria, hematuria, frequency.     Objective:   Physical Exam Vitals reviewed.  Constitutional:      Appearance: Normal appearance. She is obese.  HENT:     Head: Normocephalic and atraumatic.  Eyes:     Conjunctiva/sclera: Conjunctivae normal.  Cardiovascular:     Rate and Rhythm: Normal rate and regular rhythm.     Heart sounds: Normal heart sounds.  Pulmonary:     Effort: Pulmonary effort is normal.     Breath sounds: Normal breath sounds.  Musculoskeletal:     Right lower leg: Edema (trace) present.     Left lower leg: Edema (trace) present.  Skin:    General: Skin is warm and dry.  Neurological:     Mental Status: She is alert and oriented to person, place, and time.       BP 104/72 (BP Location: Left Arm, Patient Position: Sitting, Cuff Size: Large)   Pulse 70   Temp 97.9 F (36.6 C) (Temporal)   Ht 5\' 3"  (1.6 m)   Wt 247 lb 11.2 oz (112.4 kg)   SpO2 97%   BMI 43.88 kg/m  Wt Readings from Last 3 Encounters:  10/15/19 247 lb 11.2 oz (112.4 kg)  08/19/19 247 lb 4.8 oz (112.2 kg)  07/04/19 248 lb (112.5 kg)       Assessment & Plan:  1. Type 2 diabetes mellitus without complication, without long-term current use of insulin (HCC) - Lipid Panel - Hemoglobin A1c  2. Screening for colon cancer - Cologuard  3. Tobacco abuse - not interested  in quitting at this time, encouraged her to decrease consumption  This visit occurred during the SARS-CoV-2 public health emergency.  Safety protocols were in place, including screening questions prior to the visit, additional usage of staff PPE, and extensive cleaning of exam room while observing appropriate contact time as indicated for disinfecting solutions.      Clarene Reamer, FNP-BC  [facility]  10/16/2019 8:58 AM

## 2019-10-15 NOTE — Patient Instructions (Signed)
Health Maintenance, Female Adopting a healthy lifestyle and getting preventive care are important in promoting health and wellness. Ask your health care provider about:  The right schedule for you to have regular tests and exams.  Things you can do on your own to prevent diseases and keep yourself healthy. What should I know about diet, weight, and exercise? Eat a healthy diet   Eat a diet that includes plenty of vegetables, fruits, low-fat dairy products, and lean protein.  Do not eat a lot of foods that are high in solid fats, added sugars, or sodium. Maintain a healthy weight Body mass index (BMI) is used to identify weight problems. It estimates body fat based on height and weight. Your health care provider can help determine your BMI and help you achieve or maintain a healthy weight. Get regular exercise Get regular exercise. This is one of the most important things you can do for your health. Most adults should:  Exercise for at least 150 minutes each week. The exercise should increase your heart rate and make you sweat (moderate-intensity exercise).  Do strengthening exercises at least twice a week. This is in addition to the moderate-intensity exercise.  Spend less time sitting. Even light physical activity can be beneficial. Watch cholesterol and blood lipids Have your blood tested for lipids and cholesterol at 54 years of age, then have this test every 5 years. Have your cholesterol levels checked more often if:  Your lipid or cholesterol levels are high.  You are older than 54 years of age.  You are at high risk for heart disease. What should I know about cancer screening? Depending on your health history and family history, you may need to have cancer screening at various ages. This may include screening for:  Breast cancer.  Cervical cancer.  Colorectal cancer.  Skin cancer.  Lung cancer. What should I know about heart disease, diabetes, and high blood  pressure? Blood pressure and heart disease  High blood pressure causes heart disease and increases the risk of stroke. This is more likely to develop in people who have high blood pressure readings, are of African descent, or are overweight.  Have your blood pressure checked: ? Every 3-5 years if you are 18-39 years of age. ? Every year if you are 40 years old or older. Diabetes Have regular diabetes screenings. This checks your fasting blood sugar level. Have the screening done:  Once every three years after age 40 if you are at a normal weight and have a low risk for diabetes.  More often and at a younger age if you are overweight or have a high risk for diabetes. What should I know about preventing infection? Hepatitis B If you have a higher risk for hepatitis B, you should be screened for this virus. Talk with your health care provider to find out if you are at risk for hepatitis B infection. Hepatitis C Testing is recommended for:  Everyone born from 1945 through 1965.  Anyone with known risk factors for hepatitis C. Sexually transmitted infections (STIs)  Get screened for STIs, including gonorrhea and chlamydia, if: ? You are sexually active and are younger than 54 years of age. ? You are older than 54 years of age and your health care provider tells you that you are at risk for this type of infection. ? Your sexual activity has changed since you were last screened, and you are at increased risk for chlamydia or gonorrhea. Ask your health care provider if   you are at risk.  Ask your health care provider about whether you are at high risk for HIV. Your health care provider may recommend a prescription medicine to help prevent HIV infection. If you choose to take medicine to prevent HIV, you should first get tested for HIV. You should then be tested every 3 months for as long as you are taking the medicine. Pregnancy  If you are about to stop having your period (premenopausal) and  you may become pregnant, seek counseling before you get pregnant.  Take 400 to 800 micrograms (mcg) of folic acid every day if you become pregnant.  Ask for birth control (contraception) if you want to prevent pregnancy. Osteoporosis and menopause Osteoporosis is a disease in which the bones lose minerals and strength with aging. This can result in bone fractures. If you are 65 years old or older, or if you are at risk for osteoporosis and fractures, ask your health care provider if you should:  Be screened for bone loss.  Take a calcium or vitamin D supplement to lower your risk of fractures.  Be given hormone replacement therapy (HRT) to treat symptoms of menopause. Follow these instructions at home: Lifestyle  Do not use any products that contain nicotine or tobacco, such as cigarettes, e-cigarettes, and chewing tobacco. If you need help quitting, ask your health care provider.  Do not use street drugs.  Do not share needles.  Ask your health care provider for help if you need support or information about quitting drugs. Alcohol use  Do not drink alcohol if: ? Your health care provider tells you not to drink. ? You are pregnant, may be pregnant, or are planning to become pregnant.  If you drink alcohol: ? Limit how much you use to 0-1 drink a day. ? Limit intake if you are breastfeeding.  Be aware of how much alcohol is in your drink. In the [place] one drink equals one 12 oz bottle of beer (355 mL), one 5 oz glass of wine (148 mL), or one 1 oz glass of hard liquor (44 mL). General instructions  Schedule regular health, dental, and eye exams.  Stay current with your vaccines.  Tell your health care provider if: ? You often feel depressed. ? You have ever been abused or do not feel safe at home. Summary  Adopting a healthy lifestyle and getting preventive care are important in promoting health and wellness.  Follow your health care provider's instructions about healthy  diet, exercising, and getting tested or screened for diseases.  Follow your health care provider's instructions on monitoring your cholesterol and blood pressure. This information is not intended to replace advice given to you by your health care provider. Make sure you discuss any questions you have with your health care provider. Document Revised: 07/24/2018 Document Reviewed: 07/24/2018 Elsevier Patient Education  2020 [employer]  

## 2019-10-30 LAB — COLOGUARD: Cologuard: POSITIVE — AB

## 2019-11-03 ENCOUNTER — Ambulatory Visit
Admission: RE | Admit: 2019-11-03 | Discharge: 2019-11-03 | Disposition: A | Discharge status: Home / Self Care | Payer: Medicare HMO | Source: Ambulatory Visit | Attending: Family Medicine | Admitting: Family Medicine

## 2019-11-03 DIAGNOSIS — Z1231 Encounter for screening mammogram for malignant neoplasm of breast: Secondary | ICD-10-CM | POA: Diagnosis not present

## 2019-11-04 LAB — COLOGUARD: COLOGUARD: POSITIVE — AB

## 2019-11-05 ENCOUNTER — Telehealth: Payer: Self-pay | Admitting: Family Medicine

## 2019-11-05 ENCOUNTER — Other Ambulatory Visit: Payer: Self-pay | Admitting: Family Medicine

## 2019-11-05 DIAGNOSIS — R195 Other fecal abnormalities: Secondary | ICD-10-CM

## 2019-11-05 NOTE — Telephone Encounter (Signed)
Please call patient and let her know that her Cologuard test has come back positive. I have put in a referral for her to see gastroenterology for a colonoscopy. She should get a call from their office in the next 1-2 weeks.

## 2019-11-05 NOTE — Telephone Encounter (Signed)
Patient returned call

## 2019-11-05 NOTE — Telephone Encounter (Signed)
Message left for patient to return my call.  

## 2019-11-06 ENCOUNTER — Ambulatory Visit (INDEPENDENT_AMBULATORY_CARE_PROVIDER_SITE_OTHER): Payer: Self-pay | Admitting: Gastroenterology

## 2019-11-06 VITALS — Ht 63.0 in | Wt 247.0 lb

## 2019-11-06 DIAGNOSIS — R195 Other fecal abnormalities: Secondary | ICD-10-CM

## 2019-11-06 MED ORDER — NA SULFATE-K SULFATE-MG SULF 17.5-3.13-1.6 GM/177ML PO SOLN: 1.0000 | Freq: Once | ORAL | 0 refills | Status: AC

## 2019-11-06 NOTE — Progress Notes (Signed)
Gastroenterology Pre-Procedure Review  Request Date: 11/27/19 Requesting Physician: Dr. Vicente Males OFFICE VISIT DECLINED PATIENT REVIEW QUESTIONS: The patient responded to the following health history questions as indicated:    1. Are you having any GI issues? yes (loose stool, stomach cramps, spasms, discomfort upper abdominal) 2. Do you have a personal history of Polyps? no 3. Do you have a family history of Colon Cancer or Polyps? unsure 4. Diabetes Mellitus? yes (type 2 diabetic) 5. Joint replacements in the past 12 months?no 6. Major health problems in the past 3 months?no 7. Any artificial heart valves, MVP, or defibrillator?no    MEDICATIONS & ALLERGIES:    Patient reports the following regarding taking any anticoagulation/antiplatelet therapy:   Plavix, Coumadin, Eliquis, Xarelto, Lovenox, Pradaxa, Brilinta, or Effient? no Aspirin? no  Patient confirms/reports the following medications:  Current Outpatient Medications  Medication Sig Dispense Refill  . Accu-Chek FastClix Lancets MISC USE UP TO 4 TIMES DAILY AS DIRECTED 102 each 1  . ACCU-CHEK GUIDE test strip USE UP TO 4 TIMES DAILY AS DIRECTED 100 strip 5  . amLODipine (NORVASC) 10 MG tablet TAKE 1 TABLET BY MOUTH EVERY DAY 90 tablet 1  . azelastine (OPTIVAR) 0.05 % ophthalmic solution Place 1 drop into both eyes 2 (two) times daily as needed. 6 mL 12  . blood glucose meter kit and supplies Dispense based on patient and insurance preference. Use up to four times daily as directed. (FOR ICD-10 E10.9, E11.9). 1 each 0  . CVS D3 125 MCG (5000 UT) capsule TAKE 1 CAPSULE BY MOUTH DAILY WITH BREAKFAST. TAKE ALONG WITH CALCIUM AND MAGNESIUM.  5  . ezetimibe (ZETIA) 10 MG tablet TAKE 1 TABLET BY MOUTH EVERY DAY 90 tablet 0  . fluticasone (FLONASE) 50 MCG/ACT nasal spray SPRAY 2 SPRAYS INTO EACH NOSTRIL EVERY DAY 16 mL 6  . gabapentin (NEURONTIN) 100 MG capsule Take 1 capsule (100 mg total) by mouth 2 (two) times daily AND 3 capsules (300 mg  total) at bedtime. 150 capsule 5  . hydrochlorothiazide (HYDRODIURIL) 25 MG tablet TAKE 1 TABLET (25 MG TOTAL) BY MOUTH DAILY. AS DIRECTED 90 tablet 2  . levocetirizine (XYZAL) 5 MG tablet TAKE 1 TABLET BY MOUTH EVERY DAY IN THE EVENING 90 tablet 2  . levothyroxine (SYNTHROID) 125 MCG tablet TAKE 1 TABLET BY MOUTH EVERY DAY 90 tablet 2  . Magnesium 500 MG CAPS Take 1 capsule (500 mg total) by mouth 2 (two) times daily at 8 am and 10 pm. 60 capsule 11  . meloxicam (MOBIC) 15 MG tablet Take 1 tablet (15 mg total) by mouth daily. 30 tablet 5  . metFORMIN (GLUCOPHAGE) 500 MG tablet Take 1 tablet (500 mg total) by mouth 2 (two) times daily with a meal. 180 tablet 3  . Olopatadine HCl 0.2 % SOLN INSTILL 1 DROP INTO BOTH EYES EVERY DAY AS NEEDED    . oxyCODONE (OXY IR/ROXICODONE) 5 MG immediate release tablet Take 1 tablet (5 mg total) by mouth every 8 (eight) hours as needed for severe pain. Must last 30 days 90 tablet 0  . [START ON 11/16/2019] oxyCODONE (OXY IR/ROXICODONE) 5 MG immediate release tablet Take 1 tablet (5 mg total) by mouth every 8 (eight) hours as needed for severe pain. Must last 30 days 90 tablet 0  . [START ON 12/16/2019] oxyCODONE (OXY IR/ROXICODONE) 5 MG immediate release tablet Take 1 tablet (5 mg total) by mouth every 8 (eight) hours as needed for severe pain. Must last 30 days 90 tablet  0  . REFRESH CELLUVISC 1 % GEL APPLY 1 DROP TO EYE 3 (THREE) TIMES DAILY.  12  . tiZANidine (ZANAFLEX) 4 MG tablet Take 1 tablet (4 mg total) by mouth every 8 (eight) hours as needed for muscle spasms. 90 tablet 5  . VENTOLIN HFA 108 (90 Base) MCG/ACT inhaler INHALE 2 PUFFS INTO THE LUNGS EVERY 4 HOURS AS NEEDED FOR (COUGH, SHORTNESS OF BREATH OR WHEEZING.). 18 g 1   No current facility-administered medications for this visit.    Patient confirms/reports the following allergies:  No Known Allergies  No orders of the defined types were placed in this encounter.   AUTHORIZATION  INFORMATION Primary Insurance: 1D#: Group #:  Secondary Insurance: 1D#: Group #:  SCHEDULE INFORMATION: Date:  Time: Location:

## 2019-11-06 NOTE — Telephone Encounter (Signed)
Patient advised and patient states she has already been in contact with GI office

## 2019-11-06 NOTE — Patient Instructions (Addendum)
Colonoscopy scheduled at [facility] on Thursday 11/27/19 with Dr. Jonathon Bellows.  Call the Endoscopy Dept at 1pm on Wed 04/14 [id number] for arrival time.  When you report to [facility] report to [place] with Picture ID, and a driver.  Remember COVID Test should be done on Tuesday 11/25/19 at [place] betweeen 8am-1pm.  No appt is needed.  Please review the detailed colonoscopy instruction packet included with your After Visit Summary to properly prepare.  If you have any questions, please call!  Have a great day.  Sharyn Lull, [facility] [phone]

## 2019-11-25 ENCOUNTER — Other Ambulatory Visit
Admission: RE | Admit: 2019-11-25 | Discharge: 2019-11-25 | Disposition: A | Discharge status: Home / Self Care | Payer: Medicare HMO | Source: Ambulatory Visit | Attending: Gastroenterology | Admitting: Gastroenterology

## 2019-11-25 ENCOUNTER — Other Ambulatory Visit: Payer: Self-pay

## 2019-11-25 DIAGNOSIS — Z01812 Encounter for preprocedural laboratory examination: Secondary | ICD-10-CM | POA: Diagnosis present

## 2019-11-25 DIAGNOSIS — Z20822 Contact with and (suspected) exposure to covid-19: Secondary | ICD-10-CM | POA: Diagnosis not present

## 2019-11-25 LAB — SARS CORONAVIRUS 2 (TAT 6-24 HRS): SARS Coronavirus 2: NEGATIVE

## 2019-11-27 ENCOUNTER — Encounter: Payer: Self-pay | Admitting: Gastroenterology

## 2019-11-27 ENCOUNTER — Ambulatory Visit: Payer: Medicare HMO | Admitting: Certified Registered"

## 2019-11-27 ENCOUNTER — Encounter
Admission: RE | Disposition: A | Discharge status: Home / Self Care | Payer: Self-pay | Source: Home / Self Care | Attending: Gastroenterology

## 2019-11-27 ENCOUNTER — Ambulatory Visit
Admission: RE | Admit: 2019-11-27 | Discharge: 2019-11-27 | Disposition: A | Discharge status: Home / Self Care | Payer: Medicare HMO | Attending: Gastroenterology | Admitting: Gastroenterology

## 2019-11-27 ENCOUNTER — Other Ambulatory Visit: Payer: Self-pay

## 2019-11-27 DIAGNOSIS — K621 Rectal polyp: Secondary | ICD-10-CM | POA: Insufficient documentation

## 2019-11-27 DIAGNOSIS — M1990 Unspecified osteoarthritis, unspecified site: Secondary | ICD-10-CM | POA: Insufficient documentation

## 2019-11-27 DIAGNOSIS — F17210 Nicotine dependence, cigarettes, uncomplicated: Secondary | ICD-10-CM | POA: Diagnosis not present

## 2019-11-27 DIAGNOSIS — Z8249 Family history of ischemic heart disease and other diseases of the circulatory system: Secondary | ICD-10-CM | POA: Insufficient documentation

## 2019-11-27 DIAGNOSIS — G4730 Sleep apnea, unspecified: Secondary | ICD-10-CM | POA: Diagnosis not present

## 2019-11-27 DIAGNOSIS — Z791 Long term (current) use of non-steroidal anti-inflammatories (NSAID): Secondary | ICD-10-CM | POA: Diagnosis not present

## 2019-11-27 DIAGNOSIS — E785 Hyperlipidemia, unspecified: Secondary | ICD-10-CM | POA: Diagnosis not present

## 2019-11-27 DIAGNOSIS — K640 First degree hemorrhoids: Secondary | ICD-10-CM | POA: Insufficient documentation

## 2019-11-27 DIAGNOSIS — Z833 Family history of diabetes mellitus: Secondary | ICD-10-CM | POA: Diagnosis not present

## 2019-11-27 DIAGNOSIS — K635 Polyp of colon: Secondary | ICD-10-CM | POA: Diagnosis not present

## 2019-11-27 DIAGNOSIS — R195 Other fecal abnormalities: Secondary | ICD-10-CM | POA: Diagnosis not present

## 2019-11-27 DIAGNOSIS — G709 Myoneural disorder, unspecified: Secondary | ICD-10-CM | POA: Diagnosis not present

## 2019-11-27 DIAGNOSIS — Z79899 Other long term (current) drug therapy: Secondary | ICD-10-CM | POA: Insufficient documentation

## 2019-11-27 DIAGNOSIS — Z79890 Hormone replacement therapy (postmenopausal): Secondary | ICD-10-CM | POA: Diagnosis not present

## 2019-11-27 DIAGNOSIS — Z6841 Body Mass Index (BMI) 40.0 and over, adult: Secondary | ICD-10-CM | POA: Diagnosis not present

## 2019-11-27 DIAGNOSIS — E119 Type 2 diabetes mellitus without complications: Secondary | ICD-10-CM | POA: Diagnosis not present

## 2019-11-27 DIAGNOSIS — Z7984 Long term (current) use of oral hypoglycemic drugs: Secondary | ICD-10-CM | POA: Diagnosis not present

## 2019-11-27 DIAGNOSIS — I10 Essential (primary) hypertension: Secondary | ICD-10-CM | POA: Insufficient documentation

## 2019-11-27 DIAGNOSIS — E039 Hypothyroidism, unspecified: Secondary | ICD-10-CM | POA: Insufficient documentation

## 2019-11-27 DIAGNOSIS — D122 Benign neoplasm of ascending colon: Secondary | ICD-10-CM | POA: Diagnosis not present

## 2019-11-27 HISTORY — PX: COLONOSCOPY WITH PROPOFOL: SHX5780

## 2019-11-27 HISTORY — DX: Type 2 diabetes mellitus without complications: E11.9

## 2019-11-27 LAB — GLUCOSE, CAPILLARY: Glucose-Capillary: 95 mg/dL (ref 70–99)

## 2019-11-27 SURGERY — COLONOSCOPY WITH PROPOFOL
Anesthesia: General

## 2019-11-27 MED ORDER — LIDOCAINE HCL (CARDIAC) PF 100 MG/5ML IV SOSY
PREFILLED_SYRINGE | INTRAVENOUS | Status: DC | PRN
  Administered 2019-11-27: 100 mg via INTRATRACHEAL

## 2019-11-27 MED ORDER — PROPOFOL 10 MG/ML IV BOLUS
INTRAVENOUS | Status: DC | PRN
  Administered 2019-11-27: 50 mg via INTRAVENOUS

## 2019-11-27 MED ORDER — SODIUM CHLORIDE 0.9 % IV SOLN: INTRAVENOUS | Status: DC | PRN

## 2019-11-27 MED ORDER — GLYCOPYRROLATE 0.2 MG/ML IJ SOLN
INTRAMUSCULAR | Status: DC | PRN
  Administered 2019-11-27 (×2): .2 mg via INTRAVENOUS

## 2019-11-27 MED ORDER — PROPOFOL 500 MG/50ML IV EMUL
INTRAVENOUS | Status: DC | PRN
  Administered 2019-11-27: 155 ug/kg/min via INTRAVENOUS

## 2019-11-27 MED ORDER — SODIUM CHLORIDE 0.9 % IV SOLN: INTRAVENOUS | Status: DC

## 2019-11-27 NOTE — Anesthesia Preprocedure Evaluation (Signed)
Anesthesia Evaluation  Patient identified by MRN, date of birth, ID band Patient awake    Reviewed: Allergy & Precautions, NPO status , Patient's Chart, lab work & pertinent test results  Airway Mallampati: III       Dental   Pulmonary shortness of breath and with exertion, sleep apnea , Current Smoker,           Cardiovascular hypertension,      Neuro/Psych PSYCHIATRIC DISORDERS Depression  Neuromuscular disease    GI/Hepatic Neg liver ROS, hiatal hernia, GERD  ,  Endo/Other  diabetesHypothyroidism Morbid obesity  Renal/GU negative Renal ROS  negative genitourinary   Musculoskeletal  (+) Arthritis , Osteoarthritis,    Abdominal   Peds negative pediatric ROS (+)  Hematology negative hematology ROS (+)   Anesthesia Other Findings Past Medical History: No date: Arthritis may 2012: Bell's palsy No date: Chest pain No date: Diabetes mellitus without complication (HCC) No date: GERD (gastroesophageal reflux disease) No date: H/O hiatal hernia No date: Hyperlipidemia No date: Hypertension No date: Hypothyroidism No date: Shingles No date: Shortness of breath No date: Sleep apnea     Comment:  uses cpap  Reproductive/Obstetrics                             Anesthesia Physical Anesthesia Plan  ASA: III  Anesthesia Plan: General   Post-op Pain Management:    Induction: Intravenous  PONV Risk Score and Plan: Propofol infusion  Airway Management Planned: Nasal Cannula  Additional Equipment:   Intra-op Plan:   Post-operative Plan:   Informed Consent: I have reviewed the patients History and Physical, chart, labs and discussed the procedure including the risks, benefits and alternatives for the proposed anesthesia with the patient or authorized representative who has indicated his/her understanding and acceptance.     Dental advisory given  Plan Discussed with: CRNA and  Surgeon  Anesthesia Plan Comments:         Anesthesia Quick Evaluation

## 2019-11-27 NOTE — Anesthesia Postprocedure Evaluation (Signed)
Anesthesia Post Note  Patient: Teresa Mcgee  Procedure(s) Performed: COLONOSCOPY WITH PROPOFOL (N/A )  Patient location during evaluation: Endoscopy Anesthesia Type: General Level of consciousness: awake and alert and oriented Pain management: pain level controlled Vital Signs Assessment: post-procedure vital signs reviewed and stable Respiratory status: spontaneous breathing Cardiovascular status: blood pressure returned to baseline Anesthetic complications: no     Last Vitals:  Vitals:   11/27/19 1040 11/27/19 1048  BP: 124/82 122/80  Pulse: 73 68  Resp: 16 14  Temp:    SpO2: 99% 100%    Last Pain:  Vitals:   11/27/19 1048  TempSrc:   PainSc: 0-No pain                 Siera Beyersdorf

## 2019-11-27 NOTE — Op Note (Signed)
[facility] Gastroenterology Patient Name: Teresa Mcgee Procedure Date: 11/27/2019 9:44 AM MRN: [id number] Account #: [id number] Date of Birth: Mar 26, 1966 Admit Type: Outpatient Age: 54 Room: [facility] ENDO ROOM 1 Gender: Female Note Status: Finalized Procedure:             Colonoscopy Indications:           Positive Cologuard test Providers:             Jonathon Bellows MD, MD Medicines:             Monitored Anesthesia Care Complications:         No immediate complications. Procedure:             Pre-Anesthesia Assessment:                        - Prior to the procedure, a History and Physical was                         performed, and patient medications, allergies and                         sensitivities were reviewed. The patient's tolerance                         of previous anesthesia was reviewed.                        - The risks and benefits of the procedure and the                         sedation options and risks were discussed with the                         patient. All questions were answered and informed                         consent was obtained.                        - ASA Grade Assessment: II - A patient with mild                         systemic disease.                        After obtaining informed consent, the colonoscope was                         passed under direct vision. Throughout the procedure,                         the patient's blood pressure, pulse, and oxygen                         saturations were monitored continuously. The                         Colonoscope was introduced through the anus and  advanced to the the cecum, identified by the                         appendiceal orifice. The colonoscopy was performed                         with ease. The patient tolerated the procedure well.                         The quality of the bowel preparation was good. Findings:      The perianal and digital  rectal examinations were normal.      Four sessile polyps were found in the descending colon and ascending       colon. The polyps were 4 to 6 mm in size. These polyps were removed with       a cold snare. Resection and retrieval were complete.      Four sessile polyps were found in the rectum. The polyps were 4 to 6 mm       in size. These polyps were removed with a cold snare. Resection and       retrieval were complete.      Non-bleeding internal hemorrhoids were found during retroflexion. The       hemorrhoids were medium-sized and Grade I (internal hemorrhoids that do       not prolapse).      The exam was otherwise without abnormality on direct and retroflexion       views. Impression:            - Four 4 to 6 mm polyps in the descending colon and in                         the ascending colon, removed with a cold snare.                         Resected and retrieved.                        - Four 4 to 6 mm polyps in the rectum, removed with a                         cold snare. Resected and retrieved.                        - Non-bleeding internal hemorrhoids.                        - The examination was otherwise normal on direct and                         retroflexion views. Recommendation:        - Discharge patient to home (with escort).                        - Resume previous diet.                        - Continue present medications.                        -  Await pathology results.                        - Repeat colonoscopy for surveillance based on                         pathology results. Procedure Code(s):     --- Professional ---                        [phone], Colonoscopy, flexible; with removal of                         tumor(s), polyp(s), or other lesion(s) by snare                         technique Diagnosis Code(s):     --- Professional ---                        K63.5, Polyp of colon                        K64.0, First degree hemorrhoids                         K62.1, Rectal polyp                        R19.5, Other fecal abnormalities CPT copyright 2019 American Medical Association. All rights reserved. The codes documented in this report are preliminary and upon coder review may  be revised to meet current compliance requirements. Jonathon Bellows, MD Jonathon Bellows MD, MD 11/27/2019 10:18:56 AM This report has been signed electronically. Number of Addenda: 0 Note Initiated On: 11/27/2019 9:44 AM Scope Withdrawal Time: 0 hours 18 minutes 7 seconds  Total Procedure Duration: 0 hours 20 minutes 53 seconds  Estimated Blood Loss:  Estimated blood loss: none.      [facility]

## 2019-11-27 NOTE — H&P (Signed)
Jonathon Bellows, MD [address] [place], [place], [place], [address] [address], [place], [place], [place], [address] Phone: [phone]  Fax: [phone]  Primary Care Physician:  Elby Beck, FNP   Pre-Procedure History & Physical: HPI:  Teresa Mcgee is a 54 y.o. female is here for an colonoscopy.   Past Medical History:  Diagnosis Date  . Arthritis   . Bell's palsy may 2012  . Chest pain   . Diabetes mellitus without complication ([place])   . GERD (gastroesophageal reflux disease)   . H/O hiatal hernia   . Hyperlipidemia   . Hypertension   . Hypothyroidism   . Shingles   . Shortness of breath   . Sleep apnea    uses cpap    Past Surgical History:  Procedure Laterality Date  . ABDOMINAL HYSTERECTOMY    . CESAREAN SECTION    . cesarian     3x  . PARTIAL HYSTERECTOMY  2000   abnormal uterine bleeding  . THYROIDECTOMY  1998  . TOOTH EXTRACTION    . TUBAL LIGATION      Prior to Admission medications   Medication Sig Start Date End Date Taking? Authorizing Provider  amLODipine (NORVASC) 10 MG tablet TAKE 1 TABLET BY MOUTH EVERY DAY 06/25/19  Yes Elby Beck, FNP  ezetimibe (ZETIA) 10 MG tablet TAKE 1 TABLET BY MOUTH EVERY DAY 09/01/19  Yes Elby Beck, FNP  gabapentin (NEURONTIN) 100 MG capsule Take 1 capsule (100 mg total) by mouth 2 (two) times daily AND 3 capsules (300 mg total) at bedtime. 07/19/19 01/15/20 Yes Milinda Pointer, MD  hydrochlorothiazide (HYDRODIURIL) 25 MG tablet TAKE 1 TABLET (25 MG TOTAL) BY MOUTH DAILY. AS DIRECTED 05/15/19  Yes Elby Beck, FNP  levothyroxine (SYNTHROID) 125 MCG tablet TAKE 1 TABLET BY MOUTH EVERY DAY 07/21/19  Yes Elby Beck, FNP  meloxicam (MOBIC) 15 MG tablet Take 1 tablet (15 mg total) by mouth daily. 07/19/19 01/15/20 Yes Milinda Pointer, MD  metFORMIN (GLUCOPHAGE) 500 MG tablet Take 1 tablet (500 mg total) by mouth 2 (two) times daily with a meal. 03/07/19  Yes Elby Beck,  FNP  oxyCODONE (OXY IR/ROXICODONE) 5 MG immediate release tablet Take 1 tablet (5 mg total) by mouth every 8 (eight) hours as needed for severe pain. Must last 30 days 11/16/19 12/16/19 Yes Milinda Pointer, MD  tiZANidine (ZANAFLEX) 4 MG tablet Take 1 tablet (4 mg total) by mouth every 8 (eight) hours as needed for muscle spasms. 07/19/19 01/15/20 Yes Milinda Pointer, MD  Accu-Chek FastClix Lancets MISC USE UP TO 4 TIMES DAILY AS DIRECTED 04/08/19   Elby Beck, FNP  ACCU-CHEK GUIDE test strip USE UP TO 4 TIMES DAILY AS DIRECTED 05/01/19   Elby Beck, FNP  azelastine (OPTIVAR) 0.05 % ophthalmic solution Place 1 drop into both eyes 2 (two) times daily as needed. 11/11/18   Elby Beck, FNP  blood glucose meter kit and supplies Dispense based on patient and insurance preference. Use up to four times daily as directed. (FOR ICD-10 E10.9, E11.9). 03/07/19   Elby Beck, FNP  CVS D3 125 MCG (5000 UT) capsule TAKE 1 CAPSULE BY MOUTH DAILY WITH BREAKFAST. TAKE ALONG WITH CALCIUM AND MAGNESIUM. 05/25/18   [provider]  fluticasone (FLONASE) 50 MCG/ACT nasal spray SPRAY 2 SPRAYS INTO EACH NOSTRIL EVERY DAY 08/25/19   Elby Beck, FNP  levocetirizine (XYZAL) 5 MG tablet TAKE 1 TABLET BY MOUTH  EVERY DAY IN THE EVENING 04/23/19   Elby Beck, FNP  Magnesium 500 MG CAPS Take 1 capsule (500 mg total) by mouth 2 (two) times daily at 8 am and 10 pm. 07/19/19 07/18/20  Milinda Pointer, MD  Olopatadine HCl 0.2 % SOLN INSTILL 1 DROP INTO BOTH EYES EVERY DAY AS NEEDED 03/31/19   [provider]  oxyCODONE (OXY IR/ROXICODONE) 5 MG immediate release tablet Take 1 tablet (5 mg total) by mouth every 8 (eight) hours as needed for severe pain. Must last 30 days 10/17/19 11/16/19  Milinda Pointer, MD  oxyCODONE (OXY IR/ROXICODONE) 5 MG immediate release tablet Take 1 tablet (5 mg total) by mouth every 8 (eight) hours as needed for severe pain. Must last 30 days 12/16/19 01/15/20   Milinda Pointer, MD  REFRESH CELLUVISC 1 % GEL APPLY 1 DROP TO EYE 3 (THREE) TIMES DAILY. 11/16/17   [provider]  VENTOLIN HFA 108 (90 Base) MCG/ACT inhaler INHALE 2 PUFFS INTO THE LUNGS EVERY 4 HOURS AS NEEDED FOR (COUGH, SHORTNESS OF BREATH OR WHEEZING.). 10/09/19   Elby Beck, FNP    Allergies as of 11/06/2019  . (No Known Allergies)    Family History  Problem Relation Age of Onset  . Heart attack Father 83       deceased  . Diabetes Brother   . Stroke Brother 25       twice  . Stroke Sister 12  . Diabetes Sister   . Heart attack Sister 106  . Heart attack Brother 45  . Breast cancer Maternal Aunt     Social History   Socioeconomic History  . Marital status: Married    Spouse name: Not on file  . Number of children: Not on file  . Years of education: Not on file  . Highest education level: Not on file  Occupational History  . Occupation: disabled    Employer: [employer]    Comment: back  Tobacco Use  . Smoking status: Current Every Day Smoker    Packs/day: 2.00    Years: 20.00    Pack years: 40.00    Types: Cigarettes  . Smokeless tobacco: Never Used  . Tobacco comment: used to smoke 1.5 - 2 PPD, now down to 4-5 cigs a day--08/20/2019  Substance and Sexual Activity  . Alcohol use: No  . Drug use: No  . Sexual activity: Not Currently  Other Topics Concern  . Not on file  Social History Narrative   Married but she and husband separated, she helps take care of him (he is in poor health)   She lives with her grown son who is mentally disabled   She has been on disability since 2011   Social Determinants of Health   Financial Resource Strain:   . Difficulty of Paying Living Expenses:   Food Insecurity:   . Worried About Charity fundraiser in the Last Year:   . Arboriculturist in the Last Year:   Transportation Needs:   . Film/video editor (Medical):   Marland Kitchen Lack of Transportation (Non-Medical):   Physical Activity:   . Days of  Exercise per Week:   . Minutes of Exercise per Session:   Stress:   . Feeling of Stress :   Social Connections:   . Frequency of Communication with Friends and Family:   . Frequency of Social Gatherings with Friends and Family:   . Attends Religious Services:   . Active Member of Clubs or  Organizations:   . Attends Archivist Meetings:   Marland Kitchen Marital Status:   Intimate Partner Violence:   . Fear of Current or Ex-Partner:   . Emotionally Abused:   Marland Kitchen Physically Abused:   . Sexually Abused:     Review of Systems: See HPI, otherwise negative ROS  Physical Exam: BP 116/74   Pulse (!) 58   Temp (!) 96.1 F (35.6 C) (Temporal)   Resp 20   Ht '5\' 3"'  (1.6 m)   Wt 111.6 kg   SpO2 98%   BMI 43.58 kg/m  General:   Alert,  pleasant and cooperative in NAD Head:  Normocephalic and atraumatic. Neck:  Supple; no masses or thyromegaly. Lungs:  Clear throughout to auscultation, normal respiratory effort.    Heart:  +S1, +S2, Regular rate and rhythm, No edema. Abdomen:  Soft, nontender and nondistended. Normal bowel sounds, without guarding, and without rebound.   Neurologic:  Alert and  oriented x4;  grossly normal neurologically.  Impression/Plan: [place] Mcgee is here for an colonoscopy to be performed for Screening colonoscopy cologuard test positive Risks, benefits, limitations, and alternatives regarding  colonoscopy have been reviewed with the patient.  Questions have been answered.  All parties agreeable.   Jonathon Bellows, MD  11/27/2019, 9:48 AM

## 2019-11-27 NOTE — Transfer of Care (Signed)
Immediate Anesthesia Transfer of Care Note  Patient: DOLL MISE  Procedure(s) Performed: COLONOSCOPY WITH PROPOFOL (N/A )  Patient Location: Endoscopy Unit  Anesthesia Type:General  Level of Consciousness: patient cooperative and responds to stimulation  Airway & Oxygen Therapy: Patient Spontanous Breathing and Patient connected to face mask oxygen  Post-op Assessment: Report given to RN and Post -op Vital signs reviewed and stable  Post vital signs: Reviewed and stable  Last Vitals:  Vitals Value Taken Time  BP    Temp    Pulse 87 11/27/19 1021  Resp 14 11/27/19 1021  SpO2 99 % 11/27/19 1021  Vitals shown include unvalidated device data.  Last Pain:  Vitals:   11/27/19 1020  TempSrc: Temporal  PainSc: Asleep         Complications: No apparent anesthesia complications

## 2019-11-28 ENCOUNTER — Encounter: Payer: Self-pay | Admitting: *Deleted

## 2019-11-28 ENCOUNTER — Encounter: Payer: Self-pay | Admitting: Gastroenterology

## 2019-11-28 ENCOUNTER — Ambulatory Visit (INDEPENDENT_AMBULATORY_CARE_PROVIDER_SITE_OTHER): Payer: Medicare HMO

## 2019-11-28 VITALS — BP 114/76 | Ht 63.0 in | Wt 247.7 lb

## 2019-11-28 DIAGNOSIS — Z0000 Encounter for general adult medical examination without abnormal findings: Secondary | ICD-10-CM

## 2019-11-28 LAB — SURGICAL PATHOLOGY

## 2019-11-28 NOTE — Progress Notes (Signed)
Subjective:   Teresa Mcgee is a 54 y.o. female who presents for an Initial Medicare Annual Wellness Visit.  Review of Systems: N/A      This visit is being conducted through telemedicine via telephone at the nurse health advisor's home address due to the COVID-19 pandemic. This patient has given me verbal consent via doximity to conduct this visit, patient states they are participating from their home address. Patient and myself are on the telephone call. There is no referral for this visit. Some vital signs may be absent or patient reported.    Patient identification: identified by name, DOB, and current address    Cardiac Risk Factors include: diabetes mellitus;smoking/ tobacco exposure     Objective:    Today's Vitals   11/28/19 1427 11/28/19 1444  BP: 114/76   Weight: 247 lb 11.2 oz (112.4 kg)   Height: '5\' 3"'  (1.6 m)   PainSc:  4    Body mass index is 43.88 kg/m.  Advanced Directives 11/28/2019 11/27/2019 11/12/2018 07/01/2018 02/27/2018 01/24/2018 06/28/2016  Does Patient Have a Medical Advance Directive? No No No No No No No  Type of Advance Directive - - - Living will;Healthcare Power of Attorney - - -  Would patient like information on creating a medical advance directive? No - Patient declined No - Patient declined Yes (MAU/Ambulatory/Procedural Areas - Information given) - No - Patient declined No - Patient declined No - patient declined information  Pre-existing out of facility DNR order (yellow form or pink MOST form) - - - - - - -    Current Medications (verified) Outpatient Encounter Medications as of 11/28/2019  Medication Sig  . Accu-Chek FastClix Lancets MISC USE UP TO 4 TIMES DAILY AS DIRECTED  . ACCU-CHEK GUIDE test strip USE UP TO 4 TIMES DAILY AS DIRECTED  . amLODipine (NORVASC) 10 MG tablet TAKE 1 TABLET BY MOUTH EVERY DAY  . azelastine (OPTIVAR) 0.05 % ophthalmic solution Place 1 drop into both eyes 2 (two) times daily as needed.  . blood glucose meter  kit and supplies Dispense based on patient and insurance preference. Use up to four times daily as directed. (FOR ICD-10 E10.9, E11.9).  . [employer] D3 125 MCG (5000 UT) capsule TAKE 1 CAPSULE BY MOUTH DAILY WITH BREAKFAST. TAKE ALONG WITH CALCIUM AND MAGNESIUM.  Marland Kitchen ezetimibe (ZETIA) 10 MG tablet TAKE 1 TABLET BY MOUTH EVERY DAY  . fluticasone (FLONASE) 50 MCG/ACT nasal spray SPRAY 2 SPRAYS INTO EACH NOSTRIL EVERY DAY  . gabapentin (NEURONTIN) 100 MG capsule Take 1 capsule (100 mg total) by mouth 2 (two) times daily AND 3 capsules (300 mg total) at bedtime.  . hydrochlorothiazide (HYDRODIURIL) 25 MG tablet TAKE 1 TABLET (25 MG TOTAL) BY MOUTH DAILY. AS DIRECTED  . levocetirizine (XYZAL) 5 MG tablet TAKE 1 TABLET BY MOUTH EVERY DAY IN THE EVENING  . levothyroxine (SYNTHROID) 125 MCG tablet TAKE 1 TABLET BY MOUTH EVERY DAY  . Magnesium 500 MG CAPS Take 1 capsule (500 mg total) by mouth 2 (two) times daily at 8 am and 10 pm.  . meloxicam (MOBIC) 15 MG tablet Take 1 tablet (15 mg total) by mouth daily.  . metFORMIN (GLUCOPHAGE) 500 MG tablet Take 1 tablet (500 mg total) by mouth 2 (two) times daily with a meal.  . Olopatadine HCl 0.2 % SOLN INSTILL 1 DROP INTO BOTH EYES EVERY DAY AS NEEDED  . oxyCODONE (OXY IR/ROXICODONE) 5 MG immediate release tablet Take 1 tablet (5 mg total) by  mouth every 8 (eight) hours as needed for severe pain. Must last 30 days  . [START ON 12/16/2019] oxyCODONE (OXY IR/ROXICODONE) 5 MG immediate release tablet Take 1 tablet (5 mg total) by mouth every 8 (eight) hours as needed for severe pain. Must last 30 days  . REFRESH CELLUVISC 1 % GEL APPLY 1 DROP TO EYE 3 (THREE) TIMES DAILY.  Marland Kitchen tiZANidine (ZANAFLEX) 4 MG tablet Take 1 tablet (4 mg total) by mouth every 8 (eight) hours as needed for muscle spasms.  . VENTOLIN HFA 108 (90 Base) MCG/ACT inhaler INHALE 2 PUFFS INTO THE LUNGS EVERY 4 HOURS AS NEEDED FOR (COUGH, SHORTNESS OF BREATH OR WHEEZING.).  Marland Kitchen oxyCODONE (OXY IR/ROXICODONE) 5 MG  immediate release tablet Take 1 tablet (5 mg total) by mouth every 8 (eight) hours as needed for severe pain. Must last 30 days   No facility-administered encounter medications on file as of 11/28/2019.    Allergies (verified) Patient has no known allergies.   History: Past Medical History:  Diagnosis Date  . Arthritis   . Bell's palsy may 2012  . Chest pain   . Diabetes mellitus without complication ([place])   . GERD (gastroesophageal reflux disease)   . H/O hiatal hernia   . Hyperlipidemia   . Hypertension   . Hypothyroidism   . Shingles   . Shortness of breath   . Sleep apnea    uses cpap   Past Surgical History:  Procedure Laterality Date  . ABDOMINAL HYSTERECTOMY    . CESAREAN SECTION    . cesarian     3x  . COLONOSCOPY WITH PROPOFOL N/A 11/27/2019   Procedure: COLONOSCOPY WITH PROPOFOL;  Surgeon: Jonathon Bellows, MD;  Location[facility] ENDOSCOPY;  Service: Gastroenterology;  Laterality: N/A;  . PARTIAL HYSTERECTOMY  2000   abnormal uterine bleeding  . THYROIDECTOMY  1998  . TOOTH EXTRACTION    . TUBAL LIGATION     Family History  Problem Relation Age of Onset  . Heart attack Father 89       deceased  . Diabetes Brother   . Stroke Brother 25       twice  . Stroke Sister 34  . Diabetes Sister   . Heart attack Sister 39  . Heart attack Brother 53  . Breast cancer Maternal Aunt    Social History   Socioeconomic History  . Marital status: Married    Spouse name: Not on file  . Number of children: Not on file  . Years of education: Not on file  . Highest education level: Not on file  Occupational History  . Occupation: disabled    Employer: [employer]    Comment: back  Tobacco Use  . Smoking status: Current Every Day Smoker    Packs/day: 0.50    Years: 20.00    Pack years: 10.00    Types: Cigarettes  . Smokeless tobacco: Never Used  . Tobacco comment: used to smoke 1.5 - 2 PPD, now down to 4-5 cigs a day--08/20/2019  Substance and Sexual Activity  . Alcohol  use: No  . Drug use: No  . Sexual activity: Not Currently  Other Topics Concern  . Not on file  Social History Narrative   Married but she and husband separated, she helps take care of him (he is in poor health)   She lives with her grown son who is mentally disabled   She has been on disability since 2011   Social Determinants of Engineer, drilling  Resource Strain: Low Risk   . Difficulty of Paying Living Expenses: Not hard at all  Food Insecurity: No Food Insecurity  . Worried About Charity fundraiser in the Last Year: Never true  . Ran Out of Food in the Last Year: Never true  Transportation Needs: No Transportation Needs  . Lack of Transportation (Medical): No  . Lack of Transportation (Non-Medical): No  Physical Activity: Inactive  . Days of Exercise per Week: 0 days  . Minutes of Exercise per Session: 0 min  Stress: No Stress Concern Present  . Feeling of Stress : Not at all  Social Connections:   . Frequency of Communication with Friends and Family:   . Frequency of Social Gatherings with Friends and Family:   . Attends Religious Services:   . Active Member of Clubs or Organizations:   . Attends Archivist Meetings:   Marland Kitchen Marital Status:     Tobacco Counseling Ready to quit: Not Answered Counseling given: Not Answered Comment: used to smoke 1.5 - 2 PPD, now down to 4-5 cigs a day--08/20/2019   Clinical Intake:  Pre-visit preparation completed: Yes  Pain : 0-10 Pain Score: 4  Pain Type: Chronic pain Pain Location: Back Pain Orientation: Lower Pain Descriptors / Indicators: Aching Pain Onset: More than a month ago Pain Frequency: Constant     Nutritional Risks: None Diabetes: Yes CBG done?: No Did pt. bring in CBG monitor from home?: No  How often do you need to have someone help you when you read instructions, pamphlets, or other written materials from your doctor or pharmacy?: 1 - Never What is the last grade level you completed in school?:  GED  Interpreter Needed?: No  Information entered by :: CJohnson, LPN   Activities of Daily Living In your present state of health, do you have any difficulty performing the following activities: 11/28/2019 10/15/2019  Hearing? N N  Vision? N N  Comment - -  Difficulty concentrating or making decisions? N N  Walking or climbing stairs? N N  Dressing or bathing? N N  Doing errands, shopping? N N  Preparing Food and eating ? N -  Using the Toilet? N -  In the past six months, have you accidently leaked urine? N -  Do you have problems with loss of bowel control? N -  Managing your Medications? N -  Managing your Finances? N -  Housekeeping or managing your Housekeeping? N -  Some recent data might be hidden     Immunizations and Health Maintenance Immunization History  Administered Date(s) Administered  . Influenza Split 05/23/2011, 04/24/2012  . Influenza,inj,Quad PF,6+ Mos 06/11/2019  . Tdap 04/25/2011   Health Maintenance Due  Topic Date Due  . PAP SMEAR-Modifier  06/07/2014    Patient Care Team: Elby Beck, FNP as PCP - General (Nurse Practitioner)  Indicate any recent Medical Services you may have received from other than [facility] providers in the past year (date may be approximate).     Assessment:   This is a routine wellness examination for Ivy.  Hearing/Vision screen  Hearing Screening   '125Hz'  '250Hz'  '500Hz'  '1000Hz'  '2000Hz'  '3000Hz'  '4000Hz'  '6000Hz'  '8000Hz'   Right ear:           Left ear:           Vision Screening Comments: Patient gets annual eye exams   Dietary issues and exercise activities discussed: Current Exercise Habits: The patient does not participate in regular exercise at present, Exercise  limited by: None identified  Goals    . Patient Stated     11/28/2019, I will maintain and continue medications as prescribed.       Depression Screen PHQ 2/9 Scores 11/28/2019 10/15/2019 08/19/2019 03/28/2019 11/12/2018 09/16/2018 08/19/2018  PHQ - 2 Score 0 0 0  0 0 0 0  PHQ- 9 Score 0 - - - - - -    Fall Risk Fall Risk  11/28/2019 08/19/2019 04/22/2019 02/11/2019 12/31/2018  Falls in the past year? 1 0 0 0 0  Comment slid in the mud - - - -  Number falls in past yr: 0 - - - -  Injury with Fall? 0 - - - -  Risk for fall due to : Medication side effect - - - -  Follow up Falls evaluation completed;Falls prevention discussed - - - -    Is the patient's home free of loose throw rugs in walkways, pet beds, electrical cords, etc?   yes      Grab bars in the bathroom? yes      Handrails on the stairs?   yes      Adequate lighting?   yes  Timed Get Up and Go Performed: N/A  Cognitive Function: MMSE - Mini Mental State Exam 11/28/2019  Orientation to time 5  Orientation to Place 5  Registration 3  Attention/ Calculation 5  Recall 3  Language- repeat 1       Mini Cog  Mini-Cog screen was completed. Maximum score is 22. A value of 0 denotes this part of the MMSE was not completed or the patient failed this part of the Mini-Cog screening.  Screening Tests Health Maintenance  Topic Date Due  . PAP SMEAR-Modifier  06/07/2014  . PNEUMOCOCCAL POLYSACCHARIDE VACCINE AGE 55-64 HIGH RISK  11/28/2023 (Originally 07/29/1968)  . URINE MICROALBUMIN  03/05/2020  . INFLUENZA VACCINE  03/14/2020  . HEMOGLOBIN A1C  04/16/2020  . OPHTHALMOLOGY EXAM  05/29/2020  . FOOT EXAM  06/08/2020  . TETANUS/TDAP  04/24/2021  . MAMMOGRAM  11/02/2021  . Fecal DNA (Cologuard)  10/30/2022  . HIV Screening  Completed    Qualifies for Shingles Vaccine: Yes  Cancer Screenings: Lung: Low Dose CT Chest recommended if Age 74-80 years, 30 pack-year currently smoking OR have quit w/in 15 years. Patient does not qualify. Breast: Up to date on Mammogram: Yes, completed 11/03/2019   Bone Density/Dexa: age 41 Colorectal: completed 11/27/2019  Additional Screenings:  Hepatitis C Screening: N/A     Plan:   Patient will maintain and continue medications as prescribed.   I  have personally reviewed and noted the following in the patient's chart:   . Medical and social history . Use of alcohol, tobacco or illicit drugs  . Current medications and supplements . Functional ability and status . Nutritional status . Physical activity . Advanced directives . List of other physicians . Hospitalizations, surgeries, and ER visits in previous 12 months . Vitals . Screenings to include cognitive, depression, and falls . Referrals and appointments  In addition, I have reviewed and discussed with patient certain preventive protocols, quality metrics, and best practice recommendations. A written personalized care plan for preventive services as well as general preventive health recommendations were provided to patient.     Andrez Grime, LPN   12/31/8020

## 2019-11-28 NOTE — Progress Notes (Signed)
PCP notes:  Health Maintenance: Pneumovax 23 - declined    Abnormal Screenings: none   Patient concerns: none   Nurse concerns: none   Next PCP appt.: 04/16/2020 @ 12 pm

## 2019-11-28 NOTE — Patient Instructions (Signed)
Teresa Mcgee , Thank you for taking time to come for your Medicare Wellness Visit. I appreciate your ongoing commitment to your health goals. Please review the following plan we discussed and let me know if I can assist you in the future.   Screening recommendations/referrals: Colonoscopy: Up to date, completed 11/27/2019 Mammogram: Up to date, completed 11/03/2019 Bone Density: age 54 Recommended yearly ophthalmology/optometry visit for glaucoma screening and checkup Recommended yearly dental visit for hygiene and checkup  Vaccinations: Influenza vaccine: Up to date, completed 06/11/2019 Pneumococcal vaccine: declined Tdap vaccine: Up to date, completed 04/25/2011 Shingles vaccine: discussed    Advanced directives: Advance directive discussed with you today. Even though you declined this today please call our office should you change your mind and we can give you the proper paperwork for you to fill out.  Conditions/risks identified: Diabetes  Next appointment: 04/16/2020 @ 12 pm   Preventive Care 40-64 Years, Female Preventive care refers to lifestyle choices and visits with your health care provider that can promote health and wellness. What does preventive care include?  A yearly physical exam. This is also called an annual well check.  Dental exams once or twice a year.  Routine eye exams. Ask your health care provider how often you should have your eyes checked.  Personal lifestyle choices, including:  Daily care of your teeth and gums.  Regular physical activity.  Eating a healthy diet.  Avoiding tobacco and drug use.  Limiting alcohol use.  Practicing safe sex.  Taking low-dose aspirin daily starting at age 23.  Taking vitamin and mineral supplements as recommended by your health care provider. What happens during an annual well check? The services and screenings done by your health care provider during your annual well check will depend on your age, overall health,  lifestyle risk factors, and family history of disease. Counseling  Your health care provider may ask you questions about your:  Alcohol use.  Tobacco use.  Drug use.  Emotional well-being.  Home and relationship well-being.  Sexual activity.  Eating habits.  Work and work Statistician.  Method of birth control.  Menstrual cycle.  Pregnancy history. Screening  You may have the following tests or measurements:  Height, weight, and BMI.  Blood pressure.  Lipid and cholesterol levels. These may be checked every 5 years, or more frequently if you are over 40 years old.  Skin check.  Lung cancer screening. You may have this screening every year starting at age 66 if you have a 30-pack-year history of smoking and currently smoke or have quit within the past 15 years.  Fecal occult blood test (FOBT) of the stool. You may have this test every year starting at age 9.  Flexible sigmoidoscopy or colonoscopy. You may have a sigmoidoscopy every 5 years or a colonoscopy every 10 years starting at age 53.  Hepatitis C blood test.  Hepatitis B blood test.  Sexually transmitted disease (STD) testing.  Diabetes screening. This is done by checking your blood sugar (glucose) after you have not eaten for a while (fasting). You may have this done every 1-3 years.  Mammogram. This may be done every 1-2 years. Talk to your health care provider about when you should start having regular mammograms. This may depend on whether you have a family history of breast cancer.  BRCA-related cancer screening. This may be done if you have a family history of breast, ovarian, tubal, or peritoneal cancers.  Pelvic exam and Pap test. This may be done  every 3 years starting at age 93. Starting at age 28, this may be done every 5 years if you have a Pap test in combination with an HPV test.  Bone density scan. This is done to screen for osteoporosis. You may have this scan if you are at high risk for  osteoporosis. Discuss your test results, treatment options, and if necessary, the need for more tests with your health care provider. Vaccines  Your health care provider may recommend certain vaccines, such as:  Influenza vaccine. This is recommended every year.  Tetanus, diphtheria, and acellular pertussis (Tdap, Td) vaccine. You may need a Td booster every 10 years.  Zoster vaccine. You may need this after age 42.  Pneumococcal 13-valent conjugate (PCV13) vaccine. You may need this if you have certain conditions and were not previously vaccinated.  Pneumococcal polysaccharide (PPSV23) vaccine. You may need one or two doses if you smoke cigarettes or if you have certain conditions. Talk to your health care provider about which screenings and vaccines you need and how often you need them. This information is not intended to replace advice given to you by your health care provider. Make sure you discuss any questions you have with your health care provider. Document Released: 08/27/2015 Document Revised: 04/19/2016 Document Reviewed: 06/01/2015 Elsevier Interactive Patient Education  2017 [place] Prevention in the Home Falls can cause injuries. They can happen to people of all ages. There are many things you can do to make your home safe and to help prevent falls. What can I do on the outside of my home?  Regularly fix the edges of walkways and driveways and fix any cracks.  Remove anything that might make you trip as you walk through a door, such as a raised step or threshold.  Trim any bushes or trees on the path to your home.  Use bright outdoor lighting.  Clear any walking paths of anything that might make someone trip, such as rocks or tools.  Regularly check to see if handrails are loose or broken. Make sure that both sides of any steps have handrails.  Any raised decks and porches should have guardrails on the edges.  Have any leaves, snow, or ice cleared  regularly.  Use sand or salt on walking paths during winter.  Clean up any spills in your garage right away. This includes oil or grease spills. What can I do in the bathroom?  Use night lights.  Install grab bars by the toilet and in the tub and shower. Do not use towel bars as grab bars.  Use non-skid mats or decals in the tub or shower.  If you need to sit down in the shower, use a plastic, non-slip stool.  Keep the floor dry. Clean up any water that spills on the floor as soon as it happens.  Remove soap buildup in the tub or shower regularly.  Attach bath mats securely with double-sided non-slip rug tape.  Do not have throw rugs and other things on the floor that can make you trip. What can I do in the bedroom?  Use night lights.  Make sure that you have a light by your bed that is easy to reach.  Do not use any sheets or blankets that are too big for your bed. They should not hang down onto the floor.  Have a firm chair that has side arms. You can use this for support while you get dressed.  Do not have  throw rugs and other things on the floor that can make you trip. What can I do in the kitchen?  Clean up any spills right away.  Avoid walking on wet floors.  Keep items that you use a lot in easy-to-reach places.  If you need to reach something above you, use a strong step stool that has a grab bar.  Keep electrical cords out of the way.  Do not use floor polish or wax that makes floors slippery. If you must use wax, use non-skid floor wax.  Do not have throw rugs and other things on the floor that can make you trip. What can I do with my stairs?  Do not leave any items on the stairs.  Make sure that there are handrails on both sides of the stairs and use them. Fix handrails that are broken or loose. Make sure that handrails are as long as the stairways.  Check any carpeting to make sure that it is firmly attached to the stairs. Fix any carpet that is loose  or worn.  Avoid having throw rugs at the top or bottom of the stairs. If you do have throw rugs, attach them to the floor with carpet tape.  Make sure that you have a light switch at the top of the stairs and the bottom of the stairs. If you do not have them, ask someone to add them for you. What else can I do to help prevent falls?  Wear shoes that:  Do not have high heels.  Have rubber bottoms.  Are comfortable and fit you well.  Are closed at the toe. Do not wear sandals.  If you use a stepladder:  Make sure that it is fully opened. Do not climb a closed stepladder.  Make sure that both sides of the stepladder are locked into place.  Ask someone to hold it for you, if possible.  Clearly mark and make sure that you can see:  Any grab bars or handrails.  First and last steps.  Where the edge of each step is.  Use tools that help you move around (mobility aids) if they are needed. These include:  Canes.  Walkers.  Scooters.  Crutches.  Turn on the lights when you go into a dark area. Replace any light bulbs as soon as they burn out.  Set up your furniture so you have a clear path. Avoid moving your furniture around.  If any of your floors are uneven, fix them.  If there are any pets around you, be aware of where they are.  Review your medicines with your doctor. Some medicines can make you feel dizzy. This can increase your chance of falling. Ask your doctor what other things that you can do to help prevent falls. This information is not intended to replace advice given to you by your health care provider. Make sure you discuss any questions you have with your health care provider. Document Released: 05/27/2009 Document Revised: 01/06/2016 Document Reviewed: 09/04/2014 Elsevier Interactive Patient Education  2017 [employer].

## 2019-12-08 ENCOUNTER — Encounter: Payer: Self-pay | Admitting: Podiatry

## 2019-12-08 ENCOUNTER — Other Ambulatory Visit: Payer: Self-pay

## 2019-12-08 ENCOUNTER — Ambulatory Visit (INDEPENDENT_AMBULATORY_CARE_PROVIDER_SITE_OTHER): Payer: Medicare HMO | Admitting: Podiatry

## 2019-12-08 VITALS — Temp 98.4°F

## 2019-12-08 DIAGNOSIS — M79675 Pain in left toe(s): Secondary | ICD-10-CM

## 2019-12-08 DIAGNOSIS — E119 Type 2 diabetes mellitus without complications: Secondary | ICD-10-CM | POA: Diagnosis not present

## 2019-12-08 DIAGNOSIS — B351 Tinea unguium: Secondary | ICD-10-CM

## 2019-12-08 DIAGNOSIS — M79674 Pain in right toe(s): Secondary | ICD-10-CM

## 2019-12-08 NOTE — Progress Notes (Signed)

## 2019-12-22 ENCOUNTER — Other Ambulatory Visit: Payer: Self-pay | Admitting: Family Medicine

## 2019-12-22 DIAGNOSIS — I10 Essential (primary) hypertension: Secondary | ICD-10-CM

## 2020-01-05 ENCOUNTER — Other Ambulatory Visit: Payer: Self-pay | Admitting: Pain Medicine

## 2020-01-05 DIAGNOSIS — M62838 Other muscle spasm: Secondary | ICD-10-CM

## 2020-01-05 DIAGNOSIS — M7918 Myalgia, other site: Secondary | ICD-10-CM

## 2020-01-05 DIAGNOSIS — G8929 Other chronic pain: Secondary | ICD-10-CM

## 2020-01-08 ENCOUNTER — Other Ambulatory Visit: Payer: Self-pay | Admitting: Family Medicine

## 2020-01-08 DIAGNOSIS — J302 Other seasonal allergic rhinitis: Secondary | ICD-10-CM

## 2020-01-12 ENCOUNTER — Other Ambulatory Visit: Payer: Self-pay | Admitting: Family Medicine

## 2020-01-14 ENCOUNTER — Ambulatory Visit: Payer: Medicare HMO | Attending: Pain Medicine | Admitting: Pain Medicine

## 2020-01-14 ENCOUNTER — Other Ambulatory Visit: Payer: Self-pay

## 2020-01-14 DIAGNOSIS — M62838 Other muscle spasm: Secondary | ICD-10-CM

## 2020-01-14 DIAGNOSIS — M79605 Pain in left leg: Secondary | ICD-10-CM

## 2020-01-14 DIAGNOSIS — M545 Low back pain: Secondary | ICD-10-CM

## 2020-01-14 DIAGNOSIS — G8929 Other chronic pain: Secondary | ICD-10-CM

## 2020-01-14 DIAGNOSIS — M79604 Pain in right leg: Secondary | ICD-10-CM | POA: Diagnosis not present

## 2020-01-14 DIAGNOSIS — M79601 Pain in right arm: Secondary | ICD-10-CM

## 2020-01-14 DIAGNOSIS — M79602 Pain in left arm: Secondary | ICD-10-CM

## 2020-01-14 DIAGNOSIS — M542 Cervicalgia: Secondary | ICD-10-CM

## 2020-01-14 DIAGNOSIS — G894 Chronic pain syndrome: Secondary | ICD-10-CM | POA: Diagnosis not present

## 2020-01-14 DIAGNOSIS — M792 Neuralgia and neuritis, unspecified: Secondary | ICD-10-CM

## 2020-01-14 DIAGNOSIS — M159 Polyosteoarthritis, unspecified: Secondary | ICD-10-CM

## 2020-01-14 DIAGNOSIS — M8949 Other hypertrophic osteoarthropathy, multiple sites: Secondary | ICD-10-CM

## 2020-01-14 DIAGNOSIS — M7918 Myalgia, other site: Secondary | ICD-10-CM

## 2020-01-14 MED ORDER — OXYCODONE HCL 5 MG PO TABS: 5.0000 mg | ORAL_TABLET | Freq: Three times a day (TID) | ORAL | 0 refills | Status: DC | PRN

## 2020-01-14 MED ORDER — MELOXICAM 15 MG PO TABS: 15.0000 mg | ORAL_TABLET | Freq: Every day | ORAL | 5 refills | Status: DC

## 2020-01-14 MED ORDER — GABAPENTIN 100 MG PO CAPS: ORAL_CAPSULE | ORAL | 5 refills | Status: DC

## 2020-01-14 MED ORDER — TIZANIDINE HCL 4 MG PO TABS: 4.0000 mg | ORAL_TABLET | Freq: Three times a day (TID) | ORAL | 5 refills | Status: DC | PRN

## 2020-01-14 NOTE — Progress Notes (Signed)
Patient: Teresa Mcgee  Service Category: E/M  Provider: Gaspar Cola, MD  DOB: 08/28/1965  DOS: 01/14/2020  Location: Office  MRN: [id number]  Setting: Ambulatory outpatient  Referring Provider: Elby Beck, FNP  Type: Established Patient  Specialty: Interventional Pain Management  PCP: Elby Beck, FNP  Location: Remote location  Delivery: TeleHealth     Virtual Encounter - Pain Management PROVIDER NOTE: Information contained herein reflects review and annotations entered in association with encounter. Interpretation of such information and data should be left to medically-trained personnel. Information provided to patient can be located elsewhere in the medical record under "Patient Instructions". Document created using STT-dictation technology, any transcriptional errors that may result from process are unintentional.    Contact & Pharmacy Preferred: [phone] Home: [phone] (home) Mobile: [phone] (mobile) E-mail: No e-mail address on record  [id number] [place], [id number]: [phone]: [phone]  Pre-screening  Ms. HRummelloffered "in-person" vs "virtual" encounter. She indicated preferring virtual for this encounter.   Reason COVID-19*  Social distancing based on CDC and AMA recommendations.   I contacted Teresa Blalockon 01/14/2020 via telephone.      I clearly identified myself as FGaspar Cola MD. I verified that I was speaking with the correct person using two identifiers (Name: Teresa Mcgee and date of birth: 1May 29, 1967.  Consent I sought verbal advanced consent from Teresa Blalockfor virtual visit interactions. I informed Ms. HCooleyof possible security and privacy concerns, risks, and limitations associated with providing "not-in-person" medical evaluation and management services. I also informed Ms. HEysterof the availability of "in-person"  appointments. Finally, I informed her that there would be a charge for the virtual visit and that she could be  personally, fully or partially, financially responsible for it. Ms. HFoleyexpressed understanding and agreed to proceed.   Historic Elements   Ms. MJETTA MURRAYis a 54y.o. year old, female patient evaluated today after her last contact with our practice on 01/05/2020. Teresa Mcgee has a past medical history of Arthritis, Bell's palsy (may 2012), Chest pain, Diabetes mellitus without complication (Mcgee, GERD (gastroesophageal reflux disease), H/O hiatal hernia, Hyperlipidemia, Hypertension, Hypothyroidism, Shingles, Shortness of breath, and Sleep apnea. She also  has a past surgical history that includes Thyroidectomy (1998); cesarian; Partial hysterectomy (2000); Cesarean section; Tubal ligation; Tooth extraction; Abdominal hysterectomy; and Colonoscopy with propofol (N/A, 11/27/2019). Ms. HRileshas a current medication list which includes the following prescription(s): accu-chek fastclix lancets, accu-chek guide, albuterol, amlodipine, azelastine, blood glucose meter kit and supplies, [facility] d3, ezetimibe, fluticasone, [START ON 01/15/2020] gabapentin, hydrochlorothiazide, levocetirizine, levothyroxine, magnesium, [START ON 01/15/2020] meloxicam, metformin, olopatadine hcl, [START ON 01/15/2020] oxycodone, refresh celluvisc, and [START ON 01/15/2020] tizanidine. She  reports that she has been smoking cigarettes. She has a 10.00 pack-year smoking history. She has never used smokeless tobacco. She reports that she does not drink alcohol or use drugs. Ms. HSchellinghas No Known Allergies.   HPI  Today, she is being contacted for medication management. The patient indicates doing well with the current medication regimen. No adverse reactions or side effects reported to the medications.   Pharmacotherapy Assessment  Analgesic: OxycodoneIR5 mg, 1 tab PO q 8 hrs (PRN) (15 mg/day of  oxycodone) MME/day:22.561mday.   Monitoring: [place] PMP: PDMP reviewed during this encounter.       Pharmacotherapy: No side-effects or adverse reactions reported. Compliance: No problems identified. Effectiveness:  Clinically acceptable. Plan: Refer to "POC".  UDS:  Summary  Date Value Ref Range Status  09/16/2018 FINAL  Final    Comment:    ==================================================================== TOXASSURE SELECT 13 (MW) ==================================================================== Test                             Result       Flag       Units Drug Present and Declared for Prescription Verification   Oxycodone                      218          EXPECTED   ng/mg creat   Oxymorphone                    388          EXPECTED   ng/mg creat   Noroxycodone                   239          EXPECTED   ng/mg creat   Noroxymorphone                 64           EXPECTED   ng/mg creat    Sources of oxycodone are scheduled prescription medications.    Oxymorphone, noroxycodone, and noroxymorphone are expected    metabolites of oxycodone. Oxymorphone is also available as a    scheduled prescription medication. ==================================================================== Test                      Result    Flag   Units      Ref Range   Creatinine              114              mg/dL      >=20 ==================================================================== Declared Medications:  The flagging and interpretation on this report are based on the  following declared medications.  Unexpected results may arise from  inaccuracies in the declared medications.  **Note: The testing scope of this panel includes these medications:  Oxycodone  **Note: The testing scope of this panel does not include following  reported medications:  Albuterol  Amlodipine (Norvasc)  Calcium  Cetirizine (Zyrtec)  Eye Drop (Refresh)  Ezetimibe (Zetia)  Fluticasone (Flonase)  Gabapentin   Hydrochlorothiazide  Levothyroxine  Meloxicam (Mobic)  Tizanidine  Vitamin D3 ==================================================================== For clinical consultation, please call [phone]. ====================================================================     Laboratory Chemistry Profile   Renal Lab Results  Component Value Date   BUN 10 03/06/2019   CREATININE 0.86 03/06/2019   BCR 8 (L) 01/24/2018   GFR 83.65 03/06/2019   GFRAA >60 05/25/2018   GFRNONAA >60 05/25/2018     Hepatic Lab Results  Component Value Date   AST 113 (H) 03/06/2019   ALT 107 (H) 03/06/2019   ALBUMIN 4.4 03/06/2019   ALKPHOS 76 03/06/2019   LIPASE 25 05/25/2018     Electrolytes Lab Results  Component Value Date   NA 135 03/06/2019   K 3.6 03/06/2019   CL 96 03/06/2019   CALCIUM 10.1 03/06/2019   MG 2.0 01/24/2018     Bone Lab Results  Component Value Date   VD25OH 34.76 03/06/2019   25OHVITD1 9.3 (L) 01/24/2018   25OHVITD2 <1.0 01/24/2018   25OHVITD3 9.3 01/24/2018  Inflammation (CRP: Acute Phase) (ESR: Chronic Phase) Lab Results  Component Value Date   CRP 5.5 (H) 01/24/2018   ESRSEDRATE 40 01/24/2018       Note: Above Lab results reviewed.   Imaging  MM 3D SCREEN BREAST BILATERAL CLINICAL DATA:  Screening.  EXAM: DIGITAL SCREENING BILATERAL MAMMOGRAM WITH TOMO AND CAD  COMPARISON:  None.  ACR Breast Density Category a: The breast tissue is almost entirely fatty.  FINDINGS: There are no findings suspicious for malignancy. Images were processed with CAD.  IMPRESSION: No mammographic evidence of malignancy. A result letter of this screening mammogram will be mailed directly to the patient.  RECOMMENDATION: Screening mammogram in one year. (Code:SM-B-01Y)  BI-RADS CATEGORY  1: Negative.  Electronically Signed   By: Audie Pinto M.D.   On: 11/10/2019 10:40  Assessment  The primary encounter diagnosis was Chronic pain syndrome.  Diagnoses of Chronic low back pain (Primary Area of Pain) (Bilateral) (L>R) w/o sciatica, Chronic lower extremity pain ( Secondary  Area of  Pain) (Bilateral) (L>R), Chronic neck pain (Tertiary Area of Pain) (Bilateral) (L>R), Chronic upper extremity pain (Fourth Area of Pain) (Bilateral) (L>R), Chronic musculoskeletal pain, Muscle spasms of neck, Neurogenic pain, and Osteoarthritis involving multiple joints were also pertinent to this visit.  Plan of Care  Problem-specific:  No problem-specific Assessment & Plan notes found for this encounter.  Teresa Mcgee has a current medication list which includes the following long-term medication(s): albuterol, amlodipine, ezetimibe, fluticasone, [START ON 01/15/2020] gabapentin, hydrochlorothiazide, levocetirizine, levothyroxine, magnesium, [START ON 01/15/2020] meloxicam, metformin, [START ON 01/15/2020] oxycodone, and [START ON 01/15/2020] tizanidine.  Pharmacotherapy (Medications Ordered): Meds ordered this encounter  Medications  . tiZANidine (ZANAFLEX) 4 MG tablet    Sig: Take 1 tablet (4 mg total) by mouth every 8 (eight) hours as needed for muscle spasms.    Dispense:  90 tablet    Refill:  5    Fill one day early if pharmacy is closed on scheduled refill date. May substitute for generic if available.  Marland Kitchen oxyCODONE (OXY IR/ROXICODONE) 5 MG immediate release tablet    Sig: Take 1 tablet (5 mg total) by mouth every 8 (eight) hours as needed for severe pain. Must last 30 days    Dispense:  90 tablet    Refill:  0    Chronic Pain: STOP Act (Not applicable) Fill 1 day early if closed on refill date. Do not fill until: 01/15/2020. To last until: 02/14/2020. Avoid benzodiazepines within 8 hours of opioids  . gabapentin (NEURONTIN) 100 MG capsule    Sig: Take 1 capsule (100 mg total) by mouth 2 (two) times daily AND 3 capsules (300 mg total) at bedtime.    Dispense:  150 capsule    Refill:  5    Fill one day early if pharmacy is closed on scheduled refill  date. May substitute for generic if available.  . meloxicam (MOBIC) 15 MG tablet    Sig: Take 1 tablet (15 mg total) by mouth daily.    Dispense:  30 tablet    Refill:  5    Fill one day early if pharmacy is closed on scheduled refill date. May substitute for generic if available.   Orders:  No orders of the defined types were placed in this encounter.  Follow-up plan:   Return in about 4 weeks (around 02/11/2020) for (F2F), (MM).      Interventional management options:  Considering: NOTE: NO RFA until BMI <35  Possible bilateral lumbar  facet RFA Diagnosticleft-sided sacroiliac joint block Possible left-sided sacroiliac joint RFA Diagnostic bilateral IA hip joint injection Diagnostic bilateral femoral nerve + obturator NB Possible bilateral femoral nerve + obturator nerve RFA Diagnostic left-sided LESI Diagnostic bilateral transforaminal ESI Diagnostic left-sided CESI Diagnostic bilateral cervical facet block Possible bilateral cervical facet RFA Diagnostic bilateralIAknee joint injectionswith local anesthetic and steroid Possible series of 5 bilateral intra-articular Hyalgan knee injections Diagnostic bilateral genicular nerve blocks Possible bilateral genicular nerve RFA Diagnostic trigger point injections   Palliative PRN treatment(s): Palliativebilateral lumbar facet block      Recent Visits No visits were found meeting these conditions.  Showing recent visits within past 90 days and meeting all other requirements   Today's Visits Date Type Provider Dept  01/14/20 Telemedicine Milinda Pointer, MD [facility]  Showing today's visits and meeting all other requirements   Future Appointments No visits were found meeting these conditions.  Showing future appointments within next 90 days and meeting all other requirements   I discussed the assessment and treatment plan with the patient. The patient was provided an opportunity to ask  questions and all were answered. The patient agreed with the plan and demonstrated an understanding of the instructions.  Patient advised to call back or seek an in-person evaluation if the symptoms or condition worsens.  Duration of encounter: 12 minutes.  Note by: Gaspar Cola, MD Date: 01/14/2020; Time: 11:14 AM

## 2020-02-07 ENCOUNTER — Other Ambulatory Visit: Payer: Self-pay | Admitting: Family Medicine

## 2020-02-07 DIAGNOSIS — E1165 Type 2 diabetes mellitus with hyperglycemia: Secondary | ICD-10-CM

## 2020-02-07 DIAGNOSIS — I10 Essential (primary) hypertension: Secondary | ICD-10-CM

## 2020-02-10 NOTE — Progress Notes (Signed)
PROVIDER NOTE: Information contained herein reflects review and annotations entered in association with encounter. Interpretation of such information and data should be left to medically-trained personnel. Information provided to patient can be located elsewhere in the medical record under "Patient Instructions". Document created using STT-dictation technology, any transcriptional errors that may result from process are unintentional.    Patient: Teresa Mcgee  Service Category: E/M  Provider: Gaspar Cola, MD  DOB: [birth year]  DOS: 02/11/2020  Specialty: Interventional Pain Management  MRN: [id number]  Setting: Ambulatory outpatient  PCP: Elby Beck, FNP  Type: Established Patient    Referring Provider: Elby Beck, FNP  Location: Office  Delivery: Face-to-face     HPI  Reason for encounter: Teresa Mcgee, a 54 y.o. year old female, is here today for evaluation and management of her Chronic pain syndrome [G89.4]. Teresa Mcgee primary complain today is Back Pain Last encounter: Practice (01/05/2020). My last encounter with her was on 01/05/2020. Pertinent problems: Teresa Mcgee has Muscle spasms of neck; Numbness and tingling in both hands; Chronic low back pain (Bilateral) (L>R) w/ sciatica (Bilateral); Chronic lower extremity pain (2ry area of Pain) (Bilateral) (L>R); Chronic neck pain (3ry area of Pain) (Bilateral) (L>R); Chronic pain syndrome; Chronic upper extremity pain (4th area of Pain) (Bilateral) (L>R); Cervicalgia; Chronic musculoskeletal pain; DDD (degenerative disc disease), thoracic; DDD (degenerative disc disease), cervical; Lumbar facet arthropathy (Bilateral); Lumbar facet syndrome (Bilateral); Chronic low back pain (1ry area of Pain) (Bilateral) (L>R) w/o sciatica; Spondylosis without myelopathy or radiculopathy, cervical region; Spondylosis without myelopathy or radiculopathy, lumbosacral region; DDD (degenerative disc disease), lumbosacral; Strain of lumbar  paraspinal muscle, sequela; Chronic hip pain (Bilateral) (R>L); Chronic sacroiliac joint pain (Left); Neurogenic pain; Osteoarthritis involving multiple joints; and Chronic knee pain (Bilateral) (R>L) on their pertinent problem list. Pain Assessment: Severity of Chronic pain is reported as a 4 /10. Location: Back Right, Left/pain radiaties at times down leg. Onset: More than a month ago. Quality: Pressure. Timing: Constant. Modifying factor(s): meds and rest. Vitals:  height is _0  (1.6 m) and weight is 246 lb (111.6 kg). Her temperature is 97.2 F (36.2 C) (abnormal). Her blood pressure is 111/57 (abnormal) and her pulse is 68. Her oxygen saturation is 97%.    The patient indicates doing well with the current medication regimen. No adverse reactions or side effects reported to the medications.  She indicates having some numbness in the right hand which started this morning.  Physical exam was negative for phalen's test or Tinel's test.  The patient was instructed to contact the primary care physician if this worsens or persists.  As of now, it seems that she could have simply slept wrong and she may be having a neuropraxia of the median nerve.  The distribution of this numbness is in the middle 3 fingers of the right hand.  Pharmacotherapy Assessment   Analgesic: OxycodoneIR5 mg, 1 tab PO q 8 hrs (PRN) (15 mg/day of oxycodone) MME/day:22.72m/day.   Monitoring: [place] PMP: PDMP reviewed during this encounter.       Pharmacotherapy: No side-effects or adverse reactions reported. Compliance: No problems identified. Effectiveness: Clinically acceptable.  BChauncey Fischer RN  02/11/2020  1:59 PM  Sign when Signing Visit Nursing Pain Medication Assessment:  Safety precautions to be maintained throughout the outpatient stay will include: orient to surroundings, keep bed in low position, maintain call bell within reach at all times, provide assistance with transfer out of bed and ambulation.   Medication Inspection  Compliance: Pill count conducted under aseptic conditions, in front of the patient. Neither the pills nor the bottle was removed from the patient's sight at any time. Once count was completed pills were immediately returned to the patient in their original bottle.  Medication: Oxycodone IR Pill/Patch Count: 7 of 90 pills remain Pill/Patch Appearance: Markings consistent with prescribed medication Bottle Appearance: Standard pharmacy container. Clearly labeled. Filled Date: 6 / 3 / 2021 Last Medication intake:  YesterdaySafety precautions to be maintained throughout the outpatient stay will include: orient to surroundings, keep bed in low position, maintain call bell within reach at all times, provide assistance with transfer out of bed and ambulation.     UDS:  Summary  Date Value Ref Range Status  09/16/2018 FINAL  Final    Comment:    ==================================================================== TOXASSURE SELECT 13 (MW) ==================================================================== Test                             Result       Flag       Units Drug Present and Declared for Prescription Verification   Oxycodone                      218          EXPECTED   ng/mg creat   Oxymorphone                    388          EXPECTED   ng/mg creat   Noroxycodone                   239          EXPECTED   ng/mg creat   Noroxymorphone                 64           EXPECTED   ng/mg creat    Sources of oxycodone are scheduled prescription medications.    Oxymorphone, noroxycodone, and noroxymorphone are expected    metabolites of oxycodone. Oxymorphone is also available as a    scheduled prescription medication. ==================================================================== Test                      Result    Flag   Units      Ref Range   Creatinine              114              mg/dL       >=20 ==================================================================== Declared Medications:  The flagging and interpretation on this report are based on the  following declared medications.  Unexpected results may arise from  inaccuracies in the declared medications.  **Note: The testing scope of this panel includes these medications:  Oxycodone  **Note: The testing scope of this panel does not include following  reported medications:  Albuterol  Amlodipine (Norvasc)  Calcium  Cetirizine (Zyrtec)  Eye Drop (Refresh)  Ezetimibe (Zetia)  Fluticasone (Flonase)  Gabapentin  Hydrochlorothiazide  Levothyroxine  Meloxicam (Mobic)  Tizanidine  Vitamin D3 ==================================================================== For clinical consultation, please call [phone]. ====================================================================      ROS  Constitutional: Denies any fever or chills Gastrointestinal: No reported hemesis, hematochezia, vomiting, or acute GI distress Musculoskeletal: Denies any acute onset joint swelling, redness, loss of ROM, or weakness Neurological: No reported episodes of acute  onset apraxia, aphasia, dysarthria, agnosia, amnesia, paralysis, loss of coordination, or loss of consciousness  Medication Review  Accu-Chek FastClix Lancets, Carboxymethylcellulose Sod PF, Cholecalciferol, Magnesium, Olopatadine HCl, albuterol, amLODipine, azelastine, blood glucose meter kit and supplies, ezetimibe, fluticasone, gabapentin, glucose blood, hydrochlorothiazide, levocetirizine, levothyroxine, meloxicam, metFORMIN, oxyCODONE, and tiZANidine  History Review  Allergy: Teresa Mcgee has No Known Allergies. Drug: Teresa Mcgee  reports no history of drug use. Alcohol:  reports no history of alcohol use. Tobacco:  reports that she has been smoking cigarettes. She has a 10.00 pack-year smoking history. She has never used smokeless tobacco. Social: Ms. Stoltz  reports  that she has been smoking cigarettes. She has a 10.00 pack-year smoking history. She has never used smokeless tobacco. She reports that she does not drink alcohol and does not use drugs. Medical:  has a past medical history of Arthritis, Bell's palsy (may 2012), Chest pain, Diabetes mellitus without complication ([place]), GERD (gastroesophageal reflux disease), H/O hiatal hernia, Hyperlipidemia, Hypertension, Hypothyroidism, Shingles, Shortness of breath, and Sleep apnea. Surgical: Teresa Mcgee  has a past surgical history that includes Thyroidectomy (1998); cesarian; Partial hysterectomy (2000); Cesarean section; Tubal ligation; Tooth extraction; Abdominal hysterectomy; and Colonoscopy with propofol (N/A, 11/27/2019). Family: family history includes Breast cancer in her maternal aunt; Diabetes in her brother and sister; Heart attack (age of onset: 33) in her brother; Heart attack (age of onset: 67) in her sister; Heart attack (age of onset: 77) in her father; Stroke (age of onset: 50) in her brother; Stroke (age of onset: 81) in her sister.  Laboratory Chemistry Profile   Renal Lab Results  Component Value Date   BUN 10 03/06/2019   CREATININE 0.86 03/06/2019   BCR 8 (L) 01/24/2018   GFR 83.65 03/06/2019   GFRAA >60 05/25/2018   GFRNONAA >60 05/25/2018     Hepatic Lab Results  Component Value Date   AST 113 (H) 03/06/2019   ALT 107 (H) 03/06/2019   ALBUMIN 4.4 03/06/2019   ALKPHOS 76 03/06/2019   LIPASE 25 05/25/2018     Electrolytes Lab Results  Component Value Date   NA 135 03/06/2019   K 3.6 03/06/2019   CL 96 03/06/2019   CALCIUM 10.1 03/06/2019   MG 2.0 01/24/2018     Bone Lab Results  Component Value Date   VD25OH 34.76 03/06/2019   25OHVITD1 9.3 (L) 01/24/2018   25OHVITD2 <1.0 01/24/2018   25OHVITD3 9.3 01/24/2018     Inflammation (CRP: Acute Phase) (ESR: Chronic Phase) Lab Results  Component Value Date   CRP 5.5 (H) 01/24/2018   ESRSEDRATE 40 01/24/2018        Note: Above Lab results reviewed.  Recent Imaging Review  MM 3D SCREEN BREAST BILATERAL CLINICAL DATA:  Screening.  EXAM: DIGITAL SCREENING BILATERAL MAMMOGRAM WITH TOMO AND CAD  COMPARISON:  None.  ACR Breast Density Category a: The breast tissue is almost entirely fatty.  FINDINGS: There are no findings suspicious for malignancy. Images were processed with CAD.  IMPRESSION: No mammographic evidence of malignancy. A result letter of this screening mammogram will be mailed directly to the patient.  RECOMMENDATION: Screening mammogram in one year. (Code:SM-B-01Y)  BI-RADS CATEGORY  1: Negative.  Electronically Signed   By: Audie Pinto M.D.   On: 11/10/2019 10:40 Note: Reviewed        Physical Exam  General appearance: Well nourished, well developed, and well hydrated. In no apparent acute distress Mental status: Alert, oriented x 3 (person, place, & time)  Respiratory: No evidence of acute respiratory distress Eyes: PERLA Vitals: BP (!) 111/57   Pulse 68   Temp (!) 97.2 F (36.2 C)   Ht _0  (1.6 m)   Wt 246 lb (111.6 kg)   SpO2 97%   BMI 43.58 kg/m  BMI: Estimated body mass index is 43.58 kg/m as calculated from the following:   Height as of this encounter: _1  (1.6 m).   Weight as of this encounter: 246 lb (111.6 kg). Ideal: Ideal body weight: 52.4 kg (115 lb 8.3 oz) Adjusted ideal body weight: 76.1 kg (167 lb 11.4 oz)  Assessment   Status Diagnosis  Controlled Controlled Controlled 1. Chronic pain syndrome   2. Chronic low back pain (1ry area of Pain) (Bilateral) (L>R) w/o sciatica   3. Chronic lower extremity pain (2ry area of Pain) (Bilateral) (L>R)   4. Chronic neck pain (3ry area of Pain) (Bilateral) (L>R)   5. Chronic upper extremity pain (4th area of Pain) (Bilateral) (L>R)   6. Pharmacologic therapy      Updated Problems: Problem  Chronic low back pain (1ry area of Pain) (Bilateral) (L>R) w/o sciatica  Chronic upper  extremity pain (4th area of Pain) (Bilateral) (L>R)  Chronic lower extremity pain (2ry area of Pain) (Bilateral) (L>R)  Chronic neck pain (3ry area of Pain) (Bilateral) (L>R)    Plan of Care  Problem-specific:  No problem-specific Assessment & Plan notes found for this encounter.  Teresa Mcgee has a current medication list which includes the following long-term medication(s): albuterol, amlodipine, ezetimibe, fluticasone, gabapentin, hydrochlorothiazide, levocetirizine, levothyroxine, magnesium, meloxicam, metformin, [START ON 02/14/2020] oxycodone, [START ON 03/15/2020] oxycodone, [START ON 04/14/2020] oxycodone, and tizanidine.  Pharmacotherapy (Medications Ordered): Meds ordered this encounter  Medications  . oxyCODONE (OXY IR/ROXICODONE) 5 MG immediate release tablet    Sig: Take 1 tablet (5 mg total) by mouth every 8 (eight) hours as needed for severe pain. Must last 30 days    Dispense:  90 tablet    Refill:  0    Chronic Pain: STOP Act (Not applicable) Fill 1 day early if closed on refill date. Do not fill until: 02/14/2020. To last until: 03/15/2020. Avoid benzodiazepines within 8 hours of opioids  . oxyCODONE (OXY IR/ROXICODONE) 5 MG immediate release tablet    Sig: Take 1 tablet (5 mg total) by mouth every 8 (eight) hours as needed for severe pain. Must last 30 days    Dispense:  90 tablet    Refill:  0    Chronic Pain: STOP Act (Not applicable) Fill 1 day early if closed on refill date. Do not fill until: 03/15/2020. To last until: 04/14/2020. Avoid benzodiazepines within 8 hours of opioids  . oxyCODONE (OXY IR/ROXICODONE) 5 MG immediate release tablet    Sig: Take 1 tablet (5 mg total) by mouth every 8 (eight) hours as needed for severe pain. Must last 30 days    Dispense:  90 tablet    Refill:  0    Chronic Pain: STOP Act (Not applicable) Fill 1 day early if closed on refill date. Do not fill until: 04/14/2020. To last until: 05/14/2020. Avoid benzodiazepines within 8 hours of opioids    Orders:  Orders Placed This Encounter  Procedures  . ToxASSURE Select 13 (MW), Urine    Volume: 30 ml(s). Minimum 3 ml of urine is needed. Document temperature of fresh sample. Indications: Long term (current) use of opiate analgesic [phone])    Order Specific Question:   Release  to patient    Answer:   Immediate   Follow-up plan:   Return in about 13 weeks (around 05/12/2020) for (F2F), (MM) to review results of UDS.      Interventional management options:  Considering: NOTE: NO RFA until BMI <35  Possible bilateral lumbar facet RFA Diagnosticleft-sided sacroiliac joint block Possible left-sided sacroiliac joint RFA Diagnostic bilateral IA hip joint injection Diagnostic bilateral femoral nerve + obturator NB Possible bilateral femoral nerve + obturator nerve RFA Diagnostic left-sided LESI Diagnostic bilateral transforaminal ESI Diagnostic left-sided CESI Diagnostic bilateral cervical facet block Possible bilateral cervical facet RFA Diagnostic bilateralIAknee joint injectionswith local anesthetic and steroid Possible series of 5 bilateral intra-articular Hyalgan knee injections Diagnostic bilateral genicular nerve blocks Possible bilateral genicular nerve RFA Diagnostic trigger point injections   Palliative PRN treatment(s): Palliativebilateral lumbar facet block    Recent Visits Date Type Provider Dept  01/14/20 Telemedicine Milinda Pointer, MD [facility]  Showing recent visits within past 90 days and meeting all other requirements Today's Visits Date Type Provider Dept  02/11/20 Office Visit Milinda Pointer, MD [facility]  Showing today's visits and meeting all other requirements Future Appointments No visits were found meeting these conditions. Showing future appointments within next 90 days and meeting all other requirements  I discussed the assessment and treatment plan with the patient. The patient was  provided an opportunity to ask questions and all were answered. The patient agreed with the plan and demonstrated an understanding of the instructions.  Patient advised to call back or seek an in-person evaluation if the symptoms or condition worsens.  Duration of encounter: 30 minutes.  Note by: Gaspar Cola, MD Date: 02/11/2020; Time: 4:58 PM

## 2020-02-11 ENCOUNTER — Ambulatory Visit: Payer: Medicare HMO | Attending: Pain Medicine | Admitting: Pain Medicine

## 2020-02-11 ENCOUNTER — Encounter: Payer: Self-pay | Admitting: Pain Medicine

## 2020-02-11 ENCOUNTER — Other Ambulatory Visit: Payer: Self-pay

## 2020-02-11 VITALS — BP 111/57 | HR 68 | Temp 97.2°F | Ht 63.0 in | Wt 246.0 lb

## 2020-02-11 DIAGNOSIS — Z79899 Other long term (current) drug therapy: Secondary | ICD-10-CM | POA: Insufficient documentation

## 2020-02-11 DIAGNOSIS — M545 Low back pain: Secondary | ICD-10-CM | POA: Insufficient documentation

## 2020-02-11 DIAGNOSIS — M79604 Pain in right leg: Secondary | ICD-10-CM | POA: Diagnosis present

## 2020-02-11 DIAGNOSIS — M542 Cervicalgia: Secondary | ICD-10-CM | POA: Insufficient documentation

## 2020-02-11 DIAGNOSIS — G8929 Other chronic pain: Secondary | ICD-10-CM | POA: Insufficient documentation

## 2020-02-11 DIAGNOSIS — M79602 Pain in left arm: Secondary | ICD-10-CM | POA: Insufficient documentation

## 2020-02-11 DIAGNOSIS — M79601 Pain in right arm: Secondary | ICD-10-CM | POA: Diagnosis present

## 2020-02-11 DIAGNOSIS — M79605 Pain in left leg: Secondary | ICD-10-CM | POA: Diagnosis present

## 2020-02-11 DIAGNOSIS — G894 Chronic pain syndrome: Secondary | ICD-10-CM

## 2020-02-11 MED ORDER — OXYCODONE HCL 5 MG PO TABS: 5.0000 mg | ORAL_TABLET | Freq: Three times a day (TID) | ORAL | 0 refills | Status: DC | PRN

## 2020-02-11 NOTE — Patient Instructions (Signed)

## 2020-02-11 NOTE — Progress Notes (Signed)
Nursing Pain Medication Assessment:  Safety precautions to be maintained throughout the outpatient stay will include: orient to surroundings, keep bed in low position, maintain call bell within reach at all times, provide assistance with transfer out of bed and ambulation.  Medication Inspection Compliance: Pill count conducted under aseptic conditions, in front of the patient. Neither the pills nor the bottle was removed from the patient's sight at any time. Once count was completed pills were immediately returned to the patient in their original bottle.  Medication: Oxycodone IR Pill/Patch Count: 7 of 90 pills remain Pill/Patch Appearance: Markings consistent with prescribed medication Bottle Appearance: Standard pharmacy container. Clearly labeled. Filled Date: 6 / 3 / 2021 Last Medication intake:  YesterdaySafety precautions to be maintained throughout the outpatient stay will include: orient to surroundings, keep bed in low position, maintain call bell within reach at all times, provide assistance with transfer out of bed and ambulation.

## 2020-02-18 LAB — TOXASSURE SELECT 13 (MW), URINE

## 2020-03-11 ENCOUNTER — Other Ambulatory Visit: Payer: Self-pay

## 2020-03-11 ENCOUNTER — Encounter: Payer: Self-pay | Admitting: Podiatry

## 2020-03-11 ENCOUNTER — Ambulatory Visit (INDEPENDENT_AMBULATORY_CARE_PROVIDER_SITE_OTHER): Payer: Medicare HMO | Admitting: Podiatry

## 2020-03-11 DIAGNOSIS — M79675 Pain in left toe(s): Secondary | ICD-10-CM

## 2020-03-11 DIAGNOSIS — R202 Paresthesia of skin: Secondary | ICD-10-CM

## 2020-03-11 DIAGNOSIS — B351 Tinea unguium: Secondary | ICD-10-CM | POA: Diagnosis not present

## 2020-03-11 DIAGNOSIS — R200 Anesthesia of skin: Secondary | ICD-10-CM

## 2020-03-11 DIAGNOSIS — E119 Type 2 diabetes mellitus without complications: Secondary | ICD-10-CM

## 2020-03-11 DIAGNOSIS — M79674 Pain in right toe(s): Secondary | ICD-10-CM

## 2020-03-11 NOTE — Progress Notes (Signed)

## 2020-04-16 ENCOUNTER — Encounter: Payer: Self-pay | Admitting: Family Medicine

## 2020-04-16 ENCOUNTER — Ambulatory Visit (INDEPENDENT_AMBULATORY_CARE_PROVIDER_SITE_OTHER): Payer: Medicare HMO | Admitting: Family Medicine

## 2020-04-16 ENCOUNTER — Other Ambulatory Visit: Payer: Self-pay

## 2020-04-16 VITALS — BP 124/80 | HR 84 | Temp 97.0°F | Ht 63.5 in | Wt 244.2 lb

## 2020-04-16 DIAGNOSIS — R1310 Dysphagia, unspecified: Secondary | ICD-10-CM | POA: Diagnosis not present

## 2020-04-16 DIAGNOSIS — Z1159 Encounter for screening for other viral diseases: Secondary | ICD-10-CM | POA: Diagnosis not present

## 2020-04-16 DIAGNOSIS — E7800 Pure hypercholesterolemia, unspecified: Secondary | ICD-10-CM

## 2020-04-16 DIAGNOSIS — E039 Hypothyroidism, unspecified: Secondary | ICD-10-CM

## 2020-04-16 DIAGNOSIS — M899 Disorder of bone, unspecified: Secondary | ICD-10-CM | POA: Diagnosis not present

## 2020-04-16 DIAGNOSIS — M7918 Myalgia, other site: Secondary | ICD-10-CM

## 2020-04-16 DIAGNOSIS — K219 Gastro-esophageal reflux disease without esophagitis: Secondary | ICD-10-CM

## 2020-04-16 DIAGNOSIS — E119 Type 2 diabetes mellitus without complications: Secondary | ICD-10-CM | POA: Diagnosis not present

## 2020-04-16 DIAGNOSIS — M62838 Other muscle spasm: Secondary | ICD-10-CM

## 2020-04-16 DIAGNOSIS — G8929 Other chronic pain: Secondary | ICD-10-CM

## 2020-04-16 LAB — LIPID PANEL
Cholesterol: 204 mg/dL — ABNORMAL HIGH (ref 0–200)
HDL: 46.5 mg/dL (ref >39.00)
LDL Cholesterol: 134 mg/dL — ABNORMAL HIGH (ref 0–99)
NonHDL: 157.36
Total CHOL/HDL Ratio: 4
Triglycerides: 117 mg/dL (ref 0.0–149.0)
VLDL: 23.4 mg/dL (ref 0.0–40.0)

## 2020-04-16 LAB — CBC WITH DIFFERENTIAL/PLATELET
Basophils Absolute: 0.1 10*3/uL (ref 0.0–0.1)
Basophils Relative: 1.2 % (ref 0.0–3.0)
Eosinophils Absolute: 0.1 10*3/uL (ref 0.0–0.7)
Eosinophils Relative: 2.5 % (ref 0.0–5.0)
HCT: 42.3 % (ref 36.0–46.0)
Hemoglobin: 14.2 g/dL (ref 12.0–15.0)
Lymphocytes Relative: 52.6 % — ABNORMAL HIGH (ref 12.0–46.0)
Lymphs Abs: 3.1 10*3/uL (ref 0.7–4.0)
MCHC: 33.6 g/dL (ref 30.0–36.0)
MCV: 89.9 fl (ref 78.0–100.0)
Monocytes Absolute: 0.4 10*3/uL (ref 0.1–1.0)
Monocytes Relative: 6 % (ref 3.0–12.0)
Neutro Abs: 2.2 10*3/uL (ref 1.4–7.7)
Neutrophils Relative %: 37.7 % — ABNORMAL LOW (ref 43.0–77.0)
Platelets: 182 10*3/uL (ref 150.0–400.0)
RBC: 4.71 Mil/uL (ref 3.87–5.11)
RDW: 14.1 % (ref 11.5–15.5)
WBC: 6 10*3/uL (ref 4.0–10.5)

## 2020-04-16 LAB — COMPREHENSIVE METABOLIC PANEL
ALT: 66 U/L — ABNORMAL HIGH (ref 0–35)
AST: 64 U/L — ABNORMAL HIGH (ref 0–37)
Albumin: 4.5 g/dL (ref 3.5–5.2)
Alkaline Phosphatase: 64 U/L (ref 39–117)
BUN: 8 mg/dL (ref 6–23)
CO2: 33 mEq/L — ABNORMAL HIGH (ref 19–32)
Calcium: 10 mg/dL (ref 8.4–10.5)
Chloride: 100 mEq/L (ref 96–112)
Creatinine, Ser: 0.88 mg/dL (ref 0.40–1.20)
GFR: 81.11 mL/min (ref >60.00)
Glucose, Bld: 95 mg/dL (ref 70–99)
Potassium: 3.6 mEq/L (ref 3.5–5.1)
Sodium: 141 mEq/L (ref 135–145)
Total Bilirubin: 0.4 mg/dL (ref 0.2–1.2)
Total Protein: 7.7 g/dL (ref 6.0–8.3)

## 2020-04-16 LAB — POCT GLYCOSYLATED HEMOGLOBIN (HGB A1C): Hemoglobin A1C: 5.8 % — AB (ref 4.0–5.6)

## 2020-04-16 LAB — MICROALBUMIN / CREATININE URINE RATIO
Creatinine,U: 55.3 mg/dL
Microalb Creat Ratio: 1.3 mg/g (ref 0.0–30.0)
Microalb, Ur: <0.7 mg/dL (ref 0.0–1.9)

## 2020-04-16 LAB — VITAMIN D 25 HYDROXY (VIT D DEFICIENCY, FRACTURES): VITD: 41.99 ng/mL (ref 30.00–100.00)

## 2020-04-16 LAB — TSH: TSH: 1.59 u[IU]/mL (ref 0.35–4.50)

## 2020-04-16 MED ORDER — OMEPRAZOLE 20 MG PO CPDR: 20.0000 mg | DELAYED_RELEASE_CAPSULE | Freq: Every day | ORAL | 0 refills | Status: DC

## 2020-04-16 MED ORDER — MAGNESIUM 500 MG PO CAPS: 500.0000 mg | ORAL_CAPSULE | Freq: Two times a day (BID) | ORAL | 3 refills | Status: DC

## 2020-04-16 NOTE — Patient Instructions (Signed)
Good to see you today  Follow up in 6 months  You will get a call from Dr. Georgeann Oppenheim office

## 2020-04-16 NOTE — Progress Notes (Signed)
Subjective:    Patient ID: Teresa Mcgee, female    DOB: [birth year], 54 y.o.   MRN: [id number]  HPI Chief Complaint  Patient presents with  . Follow-up    DM   This is a 54 yo female who presents today for follow up of chronic medical conditions.  Has overall been doing well.  Her family is well and she has been staying busy.  Feels like foods are choking her, pills are feeling stuck. Bad taste in mouth, "indigestion." No blood or dark bms. Has been on PPI in past, was told that her esophagus was tight. Long time ago had endoscopy.   Chronic back pain- currently on medication holiday. Doing ok.  Followed by pain management.  Requests refill of her magnesium.  Diabetes- checks at home, last reading 119. Highest 125.   Smoking- 4-5 cigarettes a day. Increased stress with her grown children.    Review of Systems Denies visual change, headaches, chest pain, shortness of breath, abdominal pain, diarrhea/constipation, urinary frequency, dysuria, leg swelling    Objective:   Physical Exam Physical Exam  Constitutional: Oriented to person, place, and time. Appears well-developed and well-nourished.  HENT:  Head: Normocephalic and atraumatic.  Eyes: Conjunctivae are normal.  Neck: Normal range of motion. Neck supple.  Cardiovascular: Normal rate, regular rhythm and normal heart sounds.   Pulmonary/Chest: Effort normal and breath sounds normal.  Musculoskeletal: No lower extremity edema.   Neurological: Alert and oriented to person, place, and time.  Skin: Skin is warm and dry.  Psychiatric: Normal mood and affect. Behavior is normal. Judgment and thought content normal.  Vitals reviewed.     BP 124/80   Pulse 84   Temp (!) 97 F (36.1 C) (Temporal)   Ht 5' 3.5" (1.613 m)   Wt 244 lb 4 oz (110.8 kg)   SpO2 98%   BMI 42.59 kg/m  Wt Readings from Last 3 Encounters:  04/16/20 244 lb 4 oz (110.8 kg)  02/11/20 246 lb (111.6 kg)  11/28/19 247 lb 11.2 oz (112.4 kg)    Results for orders placed or performed in visit on 04/16/20  CBC with Differential  Result Value Ref Range   WBC 6.0 4.0 - 10.5 K/uL   RBC 4.71 3.87 - 5.11 Mil/uL   Hemoglobin 14.2 12.0 - 15.0 g/dL   HCT 42.3 36 - 46 %   MCV 89.9 78.0 - 100.0 fl   MCHC 33.6 30.0 - 36.0 g/dL   RDW 14.1 11.5 - 15.5 %   Platelets 182.0 150 - 400 K/uL   Neutrophils Relative % 37.7 (L) 43 - 77 %   Lymphocytes Relative 52.6 (H) 12 - 46 %   Monocytes Relative 6.0 3 - 12 %   Eosinophils Relative 2.5 0 - 5 %   Basophils Relative 1.2 0 - 3 %   Neutro Abs 2.2 1.4 - 7.7 K/uL   Lymphs Abs 3.1 0.7 - 4.0 K/uL   Monocytes Absolute 0.4 0 - 1 K/uL   Eosinophils Absolute 0.1 0 - 0 K/uL   Basophils Absolute 0.1 0 - 0 K/uL  Comprehensive metabolic panel  Result Value Ref Range   Sodium 141 135 - 145 mEq/L   Potassium 3.6 3.5 - 5.1 mEq/L   Chloride 100 96 - 112 mEq/L   CO2 33 (H) 19 - 32 mEq/L   Glucose, Bld 95 70 - 99 mg/dL   BUN 8 6 - 23 mg/dL   Creatinine, Ser 0.88 0.40 - 1.20  mg/dL   Total Bilirubin 0.4 0.2 - 1.2 mg/dL   Alkaline Phosphatase 64 39 - 117 U/L   AST 64 (H) 0 - 37 U/L   ALT 66 (H) 0 - 35 U/L   Total Protein 7.7 6.0 - 8.3 g/dL   Albumin 4.5 3.5 - 5.2 g/dL   GFR 81.11 >60.00 mL/min   Calcium 10.0 8.4 - 10.5 mg/dL  Lipid Panel  Result Value Ref Range   Cholesterol 204 (H) 0 - 200 mg/dL   Triglycerides 117.0 0 - 149 mg/dL   HDL 46.50 >39.00 mg/dL   VLDL 23.4 0.0 - 40.0 mg/dL   LDL Cholesterol 134 (H) 0 - 99 mg/dL   Total CHOL/HDL Ratio 4    NonHDL 157.36   Vitamin D, 25-hydroxy  Result Value Ref Range   VITD 41.99 30.00 - 100.00 ng/mL  Microalbumin / creatinine urine ratio  Result Value Ref Range   Microalb, Ur <0.7 0.0 - 1.9 mg/dL   Creatinine,U 55.3 mg/dL   Microalb Creat Ratio 1.3 0.0 - 30.0 mg/g  TSH  Result Value Ref Range   TSH 1.59 0.35 - 4.50 uIU/mL  POCT A1C  Result Value Ref Range   Hemoglobin A1C 5.8 (A) 4.0 - 5.6 %   HbA1c POC (<> result, manual entry)     HbA1c,  POC (prediabetic range)     HbA1c, POC (controlled diabetic range)         Assessment & Plan:  1. Type 2 diabetes mellitus without complication, without long-term current use of insulin (HCC) -Well-controlled with hemoglobin A1c 5.8%.  Encouraged her to continue healthy food choices, activity as tolerated, slow and steady weight loss - POCT A1C - CBC with Differential - Comprehensive metabolic panel - Lipid Panel - Microalbumin / creatinine urine ratio  2. Disorder of skeletal system - Vitamin D, 25-hydroxy  3. Encounter for hepatitis C screening test for low risk patient - Hepatitis C antibody  4. Dysphagia, unspecified type -We will restart her PPI and have her see GI - Ambulatory referral to Gastroenterology - omeprazole (PRILOSEC) 20 MG capsule; Take 1 capsule (20 mg total) by mouth daily.  Dispense: 90 capsule; Refill: 0  5. Chronic musculoskeletal pain - Magnesium 500 MG CAPS; Take 1 capsule (500 mg total) by mouth 2 (two) times daily at 8 am and 10 pm.  Dispense: 180 capsule; Refill: 3  6. Muscle spasms of neck - Magnesium 500 MG CAPS; Take 1 capsule (500 mg total) by mouth 2 (two) times daily at 8 am and 10 pm.  Dispense: 180 capsule; Refill: 3  7. Gastroesophageal reflux disease, unspecified whether esophagitis present - omeprazole (PRILOSEC) 20 MG capsule; Take 1 capsule (20 mg total) by mouth daily.  Dispense: 90 capsule; Refill: 0  8. Acquired hypothyroidism - TSH  This visit occurred during the SARS-CoV-2 public health emergency.  Safety protocols were in place, including screening questions prior to the visit, additional usage of staff PPE, and extensive cleaning of exam room while observing appropriate contact time as indicated for disinfecting solutions.    Clarene Reamer, FNP-BC  [facility], [facility]  04/16/2020 5:45 PM

## 2020-04-20 LAB — HEPATITIS C ANTIBODY
Hepatitis C Ab: NONREACTIVE
SIGNAL TO CUT-OFF: 0.13 (ref <1.00)

## 2020-04-20 MED ORDER — ROSUVASTATIN CALCIUM 10 MG PO TABS: 10.0000 mg | ORAL_TABLET | ORAL | 3 refills | Status: DC

## 2020-04-20 NOTE — Addendum Note (Signed)
Addended by: Clarene Reamer B on: 04/20/2020 04:00 PM   Modules accepted: Orders

## 2020-04-21 ENCOUNTER — Telehealth: Payer: Self-pay | Admitting: *Deleted

## 2020-04-21 NOTE — Telephone Encounter (Signed)
Patient called stating that she was given a new prescription for a statin medication and she can not take statin drugs. Patient stated that statin medications cause her to have leg pain, weakness and can hardly walk. Patient stated that she takes Ezetimibe and that is for her cholesterol. Patient stated that she does fine on the Ezetimbe and wants to know if she can increase it for her cholesterol.

## 2020-04-21 NOTE — Telephone Encounter (Signed)
Please call patient and let her know that her cholesterol is too high on the ezetimbe and the dose can not be increased. I was hoping she could try to take the statin every other day?

## 2020-04-22 ENCOUNTER — Other Ambulatory Visit: Payer: Self-pay | Admitting: Family Medicine

## 2020-04-22 DIAGNOSIS — E119 Type 2 diabetes mellitus without complications: Secondary | ICD-10-CM

## 2020-04-22 DIAGNOSIS — E039 Hypothyroidism, unspecified: Secondary | ICD-10-CM

## 2020-04-22 DIAGNOSIS — E7800 Pure hypercholesterolemia, unspecified: Secondary | ICD-10-CM

## 2020-04-22 MED ORDER — LEVOTHYROXINE SODIUM 125 MCG PO TABS: 125.0000 ug | ORAL_TABLET | Freq: Every day | ORAL | 3 refills | Status: DC

## 2020-04-22 NOTE — Telephone Encounter (Signed)
Please tell her that I have put in a referral to the lipid clinic to get some further input regarding managing her cholesterol. She should get a call regarding an appointment.

## 2020-04-22 NOTE — Telephone Encounter (Signed)
Called pt and informed her of message from Tor Netters, [place].  Pt stated that she can't take the statin medication, not even every other day.  Pt stated that the statin medication is causing her legs to really hurt and make her weak.  She stated that her husband was taking the statin medication and he ended up not being able to walk. Pt stated she doesn't want to go through what her husband experienced. .  Pt  would like to know what other options are available.

## 2020-04-23 NOTE — Telephone Encounter (Signed)
Called pt and informed her of message from Tor Netters, [place].  Pt verbalized understanding.

## 2020-05-06 ENCOUNTER — Ambulatory Visit: Payer: Medicare HMO | Attending: Pain Medicine | Admitting: Anesthesiology

## 2020-05-06 ENCOUNTER — Encounter: Payer: Self-pay | Admitting: Anesthesiology

## 2020-05-06 ENCOUNTER — Other Ambulatory Visit: Payer: Self-pay

## 2020-05-06 DIAGNOSIS — M47817 Spondylosis without myelopathy or radiculopathy, lumbosacral region: Secondary | ICD-10-CM

## 2020-05-06 DIAGNOSIS — G894 Chronic pain syndrome: Secondary | ICD-10-CM

## 2020-05-06 DIAGNOSIS — G8929 Other chronic pain: Secondary | ICD-10-CM

## 2020-05-06 DIAGNOSIS — M542 Cervicalgia: Secondary | ICD-10-CM

## 2020-05-06 DIAGNOSIS — M545 Low back pain, unspecified: Secondary | ICD-10-CM

## 2020-05-06 DIAGNOSIS — M159 Polyosteoarthritis, unspecified: Secondary | ICD-10-CM

## 2020-05-06 DIAGNOSIS — M79601 Pain in right arm: Secondary | ICD-10-CM

## 2020-05-06 DIAGNOSIS — M62838 Other muscle spasm: Secondary | ICD-10-CM

## 2020-05-06 DIAGNOSIS — M5137 Other intervertebral disc degeneration, lumbosacral region: Secondary | ICD-10-CM

## 2020-05-06 DIAGNOSIS — M7918 Myalgia, other site: Secondary | ICD-10-CM

## 2020-05-06 DIAGNOSIS — M79604 Pain in right leg: Secondary | ICD-10-CM | POA: Diagnosis not present

## 2020-05-06 DIAGNOSIS — M79602 Pain in left arm: Secondary | ICD-10-CM

## 2020-05-06 DIAGNOSIS — M5030 Other cervical disc degeneration, unspecified cervical region: Secondary | ICD-10-CM

## 2020-05-06 DIAGNOSIS — M150 Primary generalized (osteo)arthritis: Secondary | ICD-10-CM

## 2020-05-06 DIAGNOSIS — M8949 Other hypertrophic osteoarthropathy, multiple sites: Secondary | ICD-10-CM

## 2020-05-06 DIAGNOSIS — M79605 Pain in left leg: Secondary | ICD-10-CM

## 2020-05-06 DIAGNOSIS — M51379 Other intervertebral disc degeneration, lumbosacral region without mention of lumbar back pain or lower extremity pain: Secondary | ICD-10-CM

## 2020-05-06 MED ORDER — OXYCODONE HCL 5 MG PO TABS: 5.0000 mg | ORAL_TABLET | Freq: Three times a day (TID) | ORAL | 0 refills | Status: DC | PRN

## 2020-05-07 NOTE — Progress Notes (Signed)
Virtual Visit via Video Note  I connected with Teresa Mcgee on 05/07/20 at 11:50 AM EDT by a video enabled telemedicine application and verified that I am speaking with the correct person using two identifiers.  Location: Patient: Home Provider: Pain control center   I discussed the limitations of evaluation and management by telemedicine and the availability of in person appointments. The patient expressed understanding and agreed to proceed.  History of Present Illness: I spoke with Teresa Mcgee today via video virtual conference. She reports that she has been doing reasonably well with her pain syndrome. No significant changes noted in the quality characteristic or distribution of her pain. She is taking her medications as prescribed with no side effects reported. The opioid medication regimen she is presently on continues to keep her pain under good control. She derives good functional benefit from the medications. No other changes are noted and her strength is been at baseline. She is trying to ambulate and stay active as best she can.   Observations/Objective:  Current Outpatient Medications:  .  Accu-Chek FastClix Lancets MISC, USE UP TO 4 TIMES DAILY AS DIRECTED, Disp: 102 each, Rfl: 1 .  ACCU-CHEK GUIDE test strip, USE UP TO 4 TIMES DAILY AS DIRECTED, Disp: 100 strip, Rfl: 5 .  albuterol (VENTOLIN HFA) 108 (90 Base) MCG/ACT inhaler, INHALE 2 PUFFS INTO THE LUNGS EVERY 4 HOURS AS NEEDED FOR (COUGH, SHORTNESS OF BREATH OR WHEEZING.)., Disp: 18 g, Rfl: 2 .  amLODipine (NORVASC) 10 MG tablet, TAKE 1 TABLET BY MOUTH EVERY DAY, Disp: 90 tablet, Rfl: 1 .  azelastine (OPTIVAR) 0.05 % ophthalmic solution, Place 1 drop into both eyes 2 (two) times daily as needed., Disp: 6 mL, Rfl: 12 .  blood glucose meter kit and supplies, Dispense based on patient and insurance preference. Use up to four times daily as directed. (FOR ICD-10 E10.9, E11.9)., Disp: 1 each, Rfl: 0 .  CVS D3 125 MCG (5000  UT) capsule, TAKE 1 CAPSULE BY MOUTH DAILY WITH BREAKFAST. TAKE ALONG WITH CALCIUM AND MAGNESIUM., Disp: , Rfl: 5 .  ezetimibe (ZETIA) 10 MG tablet, TAKE 1 TABLET BY MOUTH EVERY DAY, Disp: 90 tablet, Rfl: 1 .  fluticasone (FLONASE) 50 MCG/ACT nasal spray, SPRAY 2 SPRAYS INTO EACH NOSTRIL EVERY DAY, Disp: 16 mL, Rfl: 6 .  gabapentin (NEURONTIN) 100 MG capsule, Take 1 capsule (100 mg total) by mouth 2 (two) times daily AND 3 capsules (300 mg total) at bedtime., Disp: 150 capsule, Rfl: 5 .  hydrochlorothiazide (HYDRODIURIL) 25 MG tablet, TAKE 1 TABLET (25 MG TOTAL) BY MOUTH DAILY. AS DIRECTED, Disp: 90 tablet, Rfl: 2 .  levocetirizine (XYZAL) 5 MG tablet, TAKE 1 TABLET BY MOUTH EVERY DAY IN THE EVENING, Disp: 90 tablet, Rfl: 1 .  levothyroxine (SYNTHROID) 125 MCG tablet, Take 1 tablet (125 mcg total) by mouth daily., Disp: 90 tablet, Rfl: 3 .  Magnesium 500 MG CAPS, Take 1 capsule (500 mg total) by mouth 2 (two) times daily at 8 am and 10 pm., Disp: 180 capsule, Rfl: 3 .  meloxicam (MOBIC) 15 MG tablet, Take 1 tablet (15 mg total) by mouth daily., Disp: 30 tablet, Rfl: 5 .  metFORMIN (GLUCOPHAGE) 500 MG tablet, TAKE 1 TABLET (500 MG TOTAL) BY MOUTH 2 (TWO) TIMES DAILY WITH A MEAL., Disp: 180 tablet, Rfl: 3 .  Olopatadine HCl 0.2 % SOLN, INSTILL 1 DROP INTO BOTH EYES EVERY DAY AS NEEDED, Disp: , Rfl:  .  omeprazole (PRILOSEC) 20 MG capsule, Take  1 capsule (20 mg total) by mouth daily., Disp: 90 capsule, Rfl: 0 .  [START ON 05/15/2020] oxyCODONE (OXY IR/ROXICODONE) 5 MG immediate release tablet, Take 1 tablet (5 mg total) by mouth every 8 (eight) hours as needed for severe pain. Must last 30 days, Disp: 90 tablet, Rfl: 0 .  [START ON 06/14/2020] oxyCODONE (OXY IR/ROXICODONE) 5 MG immediate release tablet, Take 1 tablet (5 mg total) by mouth every 8 (eight) hours as needed for severe pain. Must last 30 days, Disp: 90 tablet, Rfl: 0 .  [START ON 07/14/2020] oxyCODONE (OXY IR/ROXICODONE) 5 MG immediate release  tablet, Take 1 tablet (5 mg total) by mouth every 8 (eight) hours as needed for severe pain. Must last 30 days, Disp: 90 tablet, Rfl: 0 .  REFRESH CELLUVISC 1 % GEL, APPLY 1 DROP TO EYE 3 (THREE) TIMES DAILY., Disp: , Rfl: 12 .  tiZANidine (ZANAFLEX) 4 MG tablet, Take 1 tablet (4 mg total) by mouth every 8 (eight) hours as needed for muscle spasms., Disp: 90 tablet, Rfl: 5  Assessment and Plan: 1. Chronic pain syndrome   2. Chronic low back pain (1ry area of Pain) (Bilateral) (L>R) w/o sciatica   3. Chronic lower extremity pain (2ry area of Pain) (Bilateral) (L>R)   4. Chronic neck pain (3ry area of Pain) (Bilateral) (L>R)   5. Chronic upper extremity pain (4th area of Pain) (Bilateral) (L>R)   6. Chronic musculoskeletal pain   7. Muscle spasms of neck   8. Osteoarthritis involving multiple joints   9. DDD (degenerative disc disease), lumbosacral   10. DDD (degenerative disc disease), cervical   11. Spondylosis without myelopathy or radiculopathy, lumbosacral region   Based on our discussion today and upon review of the [facility] practitioner database information it is appropriate to refill her medications for the next 3 months dated for October November and December. No changes in her regimen will be initiated. I encouraged her to follow-up with Dr. Dossie Arbour in 3 months and continue follow-up with her primary care physicians in 3 months. These medications were written in Dr. Adalberto Cole absence today. Patient seems to be doing well with this regimen.  Follow Up Instructions:    I discussed the assessment and treatment plan with the patient. The patient was provided an opportunity to ask questions and all were answered. The patient agreed with the plan and demonstrated an understanding of the instructions.   The patient was advised to call back or seek an in-person evaluation if the symptoms worsen or if the condition fails to improve as anticipated.  I provided 30 minutes of  non-face-to-face time during this encounter.   Molli Barrows, MD

## 2020-05-12 ENCOUNTER — Encounter: Payer: Medicare HMO | Admitting: Pain Medicine

## 2020-06-14 ENCOUNTER — Ambulatory Visit (INDEPENDENT_AMBULATORY_CARE_PROVIDER_SITE_OTHER): Payer: Medicare HMO | Admitting: Podiatry

## 2020-06-14 ENCOUNTER — Encounter: Payer: Self-pay | Admitting: Podiatry

## 2020-06-14 ENCOUNTER — Other Ambulatory Visit: Payer: Self-pay

## 2020-06-14 DIAGNOSIS — E119 Type 2 diabetes mellitus without complications: Secondary | ICD-10-CM

## 2020-06-14 DIAGNOSIS — R202 Paresthesia of skin: Secondary | ICD-10-CM

## 2020-06-14 DIAGNOSIS — R200 Anesthesia of skin: Secondary | ICD-10-CM

## 2020-06-14 DIAGNOSIS — M79674 Pain in right toe(s): Secondary | ICD-10-CM

## 2020-06-14 DIAGNOSIS — B351 Tinea unguium: Secondary | ICD-10-CM | POA: Diagnosis not present

## 2020-06-14 DIAGNOSIS — M79675 Pain in left toe(s): Secondary | ICD-10-CM | POA: Diagnosis not present

## 2020-06-14 NOTE — Progress Notes (Signed)

## 2020-06-24 ENCOUNTER — Other Ambulatory Visit: Payer: Self-pay | Admitting: Family Medicine

## 2020-06-24 DIAGNOSIS — I10 Essential (primary) hypertension: Secondary | ICD-10-CM

## 2020-07-01 ENCOUNTER — Ambulatory Visit (INDEPENDENT_AMBULATORY_CARE_PROVIDER_SITE_OTHER): Payer: Medicare HMO | Admitting: Gastroenterology

## 2020-07-01 ENCOUNTER — Other Ambulatory Visit: Payer: Self-pay

## 2020-07-01 VITALS — BP 121/79 | HR 72 | Temp 98.3°F | Ht 63.0 in | Wt 248.0 lb

## 2020-07-01 DIAGNOSIS — R1310 Dysphagia, unspecified: Secondary | ICD-10-CM | POA: Diagnosis not present

## 2020-07-01 MED ORDER — OMEPRAZOLE 40 MG PO CPDR: 40.0000 mg | DELAYED_RELEASE_CAPSULE | Freq: Every day | ORAL | 1 refills | Status: DC

## 2020-07-01 NOTE — Progress Notes (Signed)
 Kiran Anna MD, MRCP(U.K) [address]  [address]  [place], [place] [address]  Main: [phone]  Fax: [phone]   Gastroenterology Consultation  Referring Provider:     Gessner, Deborah B, FNP Primary Care Physician:  Gessner, Deborah B, FNP Primary Gastroenterologist:  Dr. Kiran Anna  Reason for Consultation:     Dysphagia        HPI:   Teresa Mcgee is a 53 y.o. y/o female referred for consultation & management  by  Gessner, Deborah B, FNP.   She states that she has suffered from acid reflux due to hiatal hernia for many years.  She had difficulty swallowing pills also for many years.  No recent progression.  Feels like it gets stuck in the center of her chest.  Denies any allergies.  No weight loss.  Sometimes meat is harder to swallow.  Takes Prilosec 20 mg daily after her dinner.  Has had an EGD many years back and was told she had a narrowing.  .  No other complaints.    Past Medical History:  Diagnosis Date  . Arthritis   . Bell's palsy may 2012  . Chest pain   . Diabetes mellitus without complication (HCC)   . GERD (gastroesophageal reflux disease)   . H/O hiatal hernia   . Hyperlipidemia   . Hypertension   . Hypothyroidism   . Shingles   . Shortness of breath   . Sleep apnea    uses cpap    Past Surgical History:  Procedure Laterality Date  . ABDOMINAL HYSTERECTOMY    . CESAREAN SECTION    . cesarian     3x  . COLONOSCOPY WITH PROPOFOL N/A 11/27/2019   Procedure: COLONOSCOPY WITH PROPOFOL;  Surgeon: Anna, Kiran, MD;  Location: [facility] ENDOSCOPY;  Service: Gastroenterology;  Laterality: N/A;  . PARTIAL HYSTERECTOMY  2000   abnormal uterine bleeding  . THYROIDECTOMY  1998  . TOOTH EXTRACTION    . TUBAL LIGATION      Prior to Admission medications   Medication Sig Start Date End Date Taking? Authorizing Provider  Accu-Chek FastClix Lancets MISC USE UP TO 4 TIMES DAILY AS DIRECTED 04/08/19   Gessner, Deborah B, FNP  ACCU-CHEK GUIDE test strip  USE UP TO 4 TIMES DAILY AS DIRECTED 05/01/19   Gessner, Deborah B, FNP  albuterol (VENTOLIN HFA) 108 (90 Base) MCG/ACT inhaler INHALE 2 PUFFS INTO THE LUNGS EVERY 4 HOURS AS NEEDED FOR (COUGH, SHORTNESS OF BREATH OR WHEEZING.). 01/13/20   Gessner, Deborah B, FNP  amLODipine (NORVASC) 10 MG tablet TAKE 1 TABLET BY MOUTH EVERY DAY 06/24/20   Gessner, Deborah B, FNP  azelastine (OPTIVAR) 0.05 % ophthalmic solution Place 1 drop into both eyes 2 (two) times daily as needed. 11/11/18   Gessner, Deborah B, FNP  blood glucose meter kit and supplies Dispense based on patient and insurance preference. Use up to four times daily as directed. (FOR ICD-10 E10.9, E11.9). 03/07/19   Gessner, Deborah B, FNP  CVS D3 125 MCG (5000 UT) capsule TAKE 1 CAPSULE BY MOUTH DAILY WITH BREAKFAST. TAKE ALONG WITH CALCIUM AND MAGNESIUM. 05/25/18   [provider]  ezetimibe (ZETIA) 10 MG tablet TAKE 1 TABLET BY MOUTH EVERY DAY 01/08/20   Gessner, Deborah B, FNP  fluticasone (FLONASE) 50 MCG/ACT nasal spray SPRAY 2 SPRAYS INTO EACH NOSTRIL EVERY DAY 08/25/19   Gessner, Deborah B, FNP  gabapentin (NEURONTIN) 100 MG capsule Take 1 capsule (100 mg total) by mouth 2 (two)   times daily AND 3 capsules (300 mg total) at bedtime. 01/15/20 07/13/20  Milinda Pointer, MD  hydrochlorothiazide (HYDRODIURIL) 25 MG tablet TAKE 1 TABLET (25 MG TOTAL) BY MOUTH DAILY. AS DIRECTED 02/09/20   Elby Beck, FNP  levocetirizine (XYZAL) 5 MG tablet TAKE 1 TABLET BY MOUTH EVERY DAY IN THE EVENING 01/08/20   Elby Beck, FNP  levothyroxine (SYNTHROID) 125 MCG tablet Take 1 tablet (125 mcg total) by mouth daily. 04/22/20   Elby Beck, FNP  Magnesium 500 MG CAPS Take 1 capsule (500 mg total) by mouth 2 (two) times daily at 8 am and 10 pm. 04/16/20 04/16/21  Elby Beck, FNP  meloxicam (MOBIC) 15 MG tablet Take 1 tablet (15 mg total) by mouth daily. 01/15/20 07/13/20  Milinda Pointer, MD  metFORMIN (GLUCOPHAGE) 500 MG tablet TAKE 1  TABLET (500 MG TOTAL) BY MOUTH 2 (TWO) TIMES DAILY WITH A MEAL. 02/09/20   Elby Beck, FNP  Olopatadine HCl 0.2 % SOLN INSTILL 1 DROP INTO BOTH EYES EVERY DAY AS NEEDED 03/31/19   [provider]  omeprazole (PRILOSEC) 20 MG capsule Take 1 capsule (20 mg total) by mouth daily. 04/16/20   Elby Beck, FNP  oxyCODONE (OXY IR/ROXICODONE) 5 MG immediate release tablet Take 1 tablet (5 mg total) by mouth every 8 (eight) hours as needed for severe pain. Must last 30 days 05/15/20 06/14/20  Molli Barrows, MD  oxyCODONE (OXY IR/ROXICODONE) 5 MG immediate release tablet Take 1 tablet (5 mg total) by mouth every 8 (eight) hours as needed for severe pain. Must last 30 days 06/14/20 07/14/20  Molli Barrows, MD  oxyCODONE (OXY IR/ROXICODONE) 5 MG immediate release tablet Take 1 tablet (5 mg total) by mouth every 8 (eight) hours as needed for severe pain. Must last 30 days 07/14/20 08/13/20  Molli Barrows, MD  REFRESH CELLUVISC 1 % GEL APPLY 1 DROP TO EYE 3 (THREE) TIMES DAILY. 11/16/17   [provider]  rosuvastatin (CRESTOR) 10 MG tablet  06/03/20   [provider]  tiZANidine (ZANAFLEX) 4 MG tablet Take 1 tablet (4 mg total) by mouth every 8 (eight) hours as needed for muscle spasms. 01/15/20 07/13/20  Milinda Pointer, MD    Family History  Problem Relation Age of Onset  . Heart attack Father 3       deceased  . Diabetes Brother   . Stroke Brother 25       twice  . Stroke Sister 52  . Diabetes Sister   . Heart attack Sister 38  . Heart attack Brother 15  . Breast cancer Maternal Aunt      Social History   Tobacco Use  . Smoking status: Current Every Day Smoker    Packs/day: 0.50    Years: 20.00    Pack years: 10.00    Types: Cigarettes  . Smokeless tobacco: Never Used  . Tobacco comment: used to smoke 1.5 - 2 PPD, now down to 4-5 cigs a day--08/20/2019  Vaping Use  . Vaping Use: Never used  Substance Use Topics  . Alcohol use: No  . Drug use: No     Allergies as of 07/01/2020  . (No Known Allergies)    Review of Systems:    All systems reviewed and negative except where noted in HPI.   Physical Exam:  There were no vitals taken for this visit. No LMP recorded. Patient has had a hysterectomy. Psych:  Alert and cooperative. Normal mood and  affect. General:   Alert,  Well-developed, well-nourished, pleasant and cooperative in NAD Head:  Normocephalic and atraumatic. Eyes:  Sclera clear, no icterus.   Conjunctiva pink. Lungs:  Respirations even and unlabored.  Clear throughout to auscultation.   No wheezes, crackles, or rhonchi. No acute distress. Heart:  Regular rate and rhythm; no murmurs, clicks, rubs, or gallops. Abdomen:  Normal bowel sounds.  No bruits.  Soft, non-tender and non-distended without masses, hepatosplenomegaly or hernias noted.  No guarding or rebound tenderness.    Neurologic:  Alert and oriented x3;  grossly normal neurologically. Psych:  Alert and cooperative. Normal mood and affect.  Imaging Studies: No results found.  Assessment and Plan:   Teresa Mcgee is a 54 y.o. y/o female has been referred for dysphagia.  Left very likely dysphagia secondary to acid reflux with underlying hiatal hernia.  Plan 1.  EGD 2.  Increase Prilosec to 40 mg a day first thing in the morning on an empty stomach to empirically treat any acid reflux and related esophageal spasm. 3.  Provided patient information on acid reflux.  Counseled on lifestyle changes including weight loss, using a wedge pillow.   I have discussed alternative options, risks & benefits,  which include, but are not limited to, bleeding, infection, perforation,respiratory complication & drug reaction.  The patient agrees with this plan & written consent will be obtained.     Follow up in 5 months  Dr Jonathon Bellows MD,MRCP(U.K)

## 2020-07-04 ENCOUNTER — Other Ambulatory Visit: Payer: Self-pay | Admitting: Pain Medicine

## 2020-07-04 DIAGNOSIS — M7918 Myalgia, other site: Secondary | ICD-10-CM

## 2020-07-04 DIAGNOSIS — M62838 Other muscle spasm: Secondary | ICD-10-CM

## 2020-07-04 DIAGNOSIS — G8929 Other chronic pain: Secondary | ICD-10-CM

## 2020-07-07 ENCOUNTER — Other Ambulatory Visit: Payer: Self-pay | Admitting: Family Medicine

## 2020-07-07 DIAGNOSIS — J302 Other seasonal allergic rhinitis: Secondary | ICD-10-CM

## 2020-07-07 NOTE — Telephone Encounter (Signed)
Pharmacy requests refill on: Levocetirizine 5 mg  LAST REFILL: 01/08/2020 LAST OV: 04/16/2020 NEXT OV: 10/15/2020 PHARMACY: [facility] [place], [place]

## 2020-07-10 ENCOUNTER — Other Ambulatory Visit: Payer: Self-pay | Admitting: Family Medicine

## 2020-07-10 DIAGNOSIS — K219 Gastro-esophageal reflux disease without esophagitis: Secondary | ICD-10-CM

## 2020-07-10 DIAGNOSIS — R1310 Dysphagia, unspecified: Secondary | ICD-10-CM

## 2020-07-12 NOTE — Telephone Encounter (Signed)
Pharmacy requests refill on: Omeprazole DR 20 mg   LAST REFILL: 07/01/2020 (Q-90,R-1) LAST OV: 04/16/2020 NEXT OV: 10/15/2020 PHARMACY: [id number] [place], [place]   Too early for refill.

## 2020-07-14 ENCOUNTER — Other Ambulatory Visit: Payer: Self-pay | Admitting: Family Medicine

## 2020-07-14 NOTE — Telephone Encounter (Signed)
Pharmacy requests refill on: Ezetimibe 10 mg   LAST REFILL: 01/08/2020 (Q-90, R-1) LAST OV: 04/16/2020 NEXT OV: 10/15/2020 PHARMACY: [id number] [place], [place]

## 2020-07-15 ENCOUNTER — Telehealth: Payer: Self-pay

## 2020-07-15 NOTE — Telephone Encounter (Signed)
Pt left v/m; pt has procedure EGD with propofol scheduled for 07/20/20; pt was advised not to take vitamins or iron supplements prior to procedure. Pt wants to know if she should stop the Vitamin D3 and magnesium should be stopped. Pt request cb after reviewed by Glenda Chroman FNP.

## 2020-07-15 NOTE — Telephone Encounter (Signed)
Please call patient and let her know that it is ok for her to hold her vitamin D and magnesium prior to procedure.

## 2020-07-16 ENCOUNTER — Other Ambulatory Visit
Admission: RE | Admit: 2020-07-16 | Discharge: 2020-07-16 | Disposition: A | Discharge status: Home / Self Care | Payer: Medicare HMO | Source: Ambulatory Visit | Attending: Gastroenterology | Admitting: Gastroenterology

## 2020-07-16 ENCOUNTER — Other Ambulatory Visit: Payer: Self-pay

## 2020-07-16 DIAGNOSIS — Z01812 Encounter for preprocedural laboratory examination: Secondary | ICD-10-CM | POA: Diagnosis present

## 2020-07-16 DIAGNOSIS — Z20822 Contact with and (suspected) exposure to covid-19: Secondary | ICD-10-CM | POA: Insufficient documentation

## 2020-07-16 NOTE — Telephone Encounter (Signed)
Called and updated patient per Tor Netters, FNP. Patient verbalized understanding.

## 2020-07-17 LAB — SARS CORONAVIRUS 2 (TAT 6-24 HRS): SARS Coronavirus 2: NEGATIVE

## 2020-07-19 ENCOUNTER — Encounter: Payer: Self-pay | Admitting: Gastroenterology

## 2020-07-20 ENCOUNTER — Encounter
Admission: RE | Disposition: A | Discharge status: Home / Self Care | Payer: Self-pay | Source: Home / Self Care | Attending: Gastroenterology

## 2020-07-20 ENCOUNTER — Encounter: Payer: Self-pay | Admitting: Gastroenterology

## 2020-07-20 ENCOUNTER — Ambulatory Visit: Payer: Medicare HMO | Admitting: Certified Registered Nurse Anesthetist

## 2020-07-20 ENCOUNTER — Ambulatory Visit
Admission: RE | Admit: 2020-07-20 | Discharge: 2020-07-20 | Disposition: A | Discharge status: Home / Self Care | Payer: Medicare HMO | Attending: Gastroenterology | Admitting: Gastroenterology

## 2020-07-20 DIAGNOSIS — Z7984 Long term (current) use of oral hypoglycemic drugs: Secondary | ICD-10-CM | POA: Insufficient documentation

## 2020-07-20 DIAGNOSIS — Z79890 Hormone replacement therapy (postmenopausal): Secondary | ICD-10-CM | POA: Insufficient documentation

## 2020-07-20 DIAGNOSIS — Z791 Long term (current) use of non-steroidal anti-inflammatories (NSAID): Secondary | ICD-10-CM | POA: Diagnosis not present

## 2020-07-20 DIAGNOSIS — F17210 Nicotine dependence, cigarettes, uncomplicated: Secondary | ICD-10-CM | POA: Insufficient documentation

## 2020-07-20 DIAGNOSIS — Z79899 Other long term (current) drug therapy: Secondary | ICD-10-CM | POA: Diagnosis not present

## 2020-07-20 DIAGNOSIS — K222 Esophageal obstruction: Secondary | ICD-10-CM | POA: Insufficient documentation

## 2020-07-20 DIAGNOSIS — R1310 Dysphagia, unspecified: Secondary | ICD-10-CM

## 2020-07-20 DIAGNOSIS — K2970 Gastritis, unspecified, without bleeding: Secondary | ICD-10-CM | POA: Diagnosis not present

## 2020-07-20 DIAGNOSIS — K219 Gastro-esophageal reflux disease without esophagitis: Secondary | ICD-10-CM | POA: Diagnosis not present

## 2020-07-20 HISTORY — PX: ESOPHAGOGASTRODUODENOSCOPY (EGD) WITH PROPOFOL: SHX5813

## 2020-07-20 LAB — GLUCOSE, CAPILLARY: Glucose-Capillary: 101 mg/dL — ABNORMAL HIGH (ref 70–99)

## 2020-07-20 SURGERY — ESOPHAGOGASTRODUODENOSCOPY (EGD) WITH PROPOFOL
Anesthesia: General

## 2020-07-20 MED ORDER — PROPOFOL 10 MG/ML IV BOLUS
INTRAVENOUS | Status: DC | PRN
  Administered 2020-07-20: 20 mg via INTRAVENOUS
  Administered 2020-07-20: 40 mg via INTRAVENOUS

## 2020-07-20 MED ORDER — PHENYLEPHRINE HCL (PRESSORS) 10 MG/ML IV SOLN
INTRAVENOUS | Status: DC | PRN
  Administered 2020-07-20 (×2): 100 ug via INTRAVENOUS

## 2020-07-20 MED ORDER — LIDOCAINE HCL (PF) 1 % IJ SOLN
INTRAMUSCULAR | Status: AC
  Filled 2020-07-20: qty 2

## 2020-07-20 MED ORDER — FENTANYL CITRATE (PF) 100 MCG/2ML IJ SOLN
INTRAMUSCULAR | Status: DC | PRN
Start: 2020-07-20 — End: 2020-07-20
  Administered 2020-07-20: 25 ug via INTRAVENOUS

## 2020-07-20 MED ORDER — SODIUM CHLORIDE 0.9 % IV SOLN: INTRAVENOUS | Status: DC

## 2020-07-20 MED ORDER — PROPOFOL 500 MG/50ML IV EMUL
INTRAVENOUS | Status: DC | PRN
  Administered 2020-07-20: 150 ug/kg/min via INTRAVENOUS

## 2020-07-20 MED ORDER — FENTANYL CITRATE (PF) 100 MCG/2ML IJ SOLN
INTRAMUSCULAR | Status: AC
  Filled 2020-07-20: qty 2

## 2020-07-20 MED ORDER — GLYCOPYRROLATE 0.2 MG/ML IJ SOLN
INTRAMUSCULAR | Status: DC | PRN
  Administered 2020-07-20: .2 mg via INTRAVENOUS

## 2020-07-20 MED ORDER — LIDOCAINE HCL (CARDIAC) PF 100 MG/5ML IV SOSY
PREFILLED_SYRINGE | INTRAVENOUS | Status: DC | PRN
  Administered 2020-07-20: 100 mg via INTRAVENOUS

## 2020-07-20 NOTE — H&P (Signed)
Teresa Bellows, MD [address], [place], [place], [place], [address] [address], [place], [place], [place], [address] Phone: [phone]  Fax: [phone]  Primary Care Physician:  Elby Beck, FNP   Pre-Procedure History & Physical: HPI:  Teresa Mcgee is a 54 y.o. female is here for an endoscopy    Past Medical History:  Diagnosis Date  . Arthritis   . Bell's palsy may 2012  . Chest pain   . Diabetes mellitus without complication ([place])   . GERD (gastroesophageal reflux disease)   . H/O hiatal hernia   . Hyperlipidemia   . Hypertension   . Hypothyroidism   . Shingles   . Shortness of breath   . Sleep apnea    uses cpap    Past Surgical History:  Procedure Laterality Date  . ABDOMINAL HYSTERECTOMY    . CESAREAN SECTION    . cesarian     3x  . COLONOSCOPY WITH PROPOFOL N/A 11/27/2019   Procedure: COLONOSCOPY WITH PROPOFOL;  Surgeon: Teresa Bellows, MD;  [facility]COPY;  Service: Gastroenterology;  Laterality: N/A;  . PARTIAL HYSTERECTOMY  2000   abnormal uterine bleeding  . THYROIDECTOMY  1998  . TOOTH EXTRACTION    . TUBAL LIGATION      Prior to Admission medications   Medication Sig Start Date End Date Taking? Authorizing Provider  amLODipine (NORVASC) 10 MG tablet TAKE 1 TABLET BY MOUTH EVERY DAY 06/24/20  Yes Elby Beck, FNP  CVS D3 125 MCG (5000 UT) capsule TAKE 1 CAPSULE BY MOUTH DAILY WITH BREAKFAST. TAKE ALONG WITH CALCIUM AND MAGNESIUM. 05/25/18  Yes [provider]  ezetimibe (ZETIA) 10 MG tablet TAKE 1 TABLET BY MOUTH EVERY DAY 07/14/20  Yes Elby Beck, FNP  gabapentin (NEURONTIN) 100 MG capsule Take 1 capsule (100 mg total) by mouth 2 (two) times daily AND 3 capsules (300 mg total) at bedtime. 01/15/20 07/20/20 Yes Milinda Pointer, MD  hydrochlorothiazide (HYDRODIURIL) 25 MG tablet TAKE 1 TABLET (25 MG TOTAL) BY MOUTH DAILY. AS DIRECTED 02/09/20  Yes Elby Beck, FNP  levocetirizine (XYZAL) 5 MG  tablet TAKE 1 TABLET BY MOUTH EVERY DAY IN THE EVENING 07/07/20  Yes Elby Beck, FNP  levothyroxine (SYNTHROID) 125 MCG tablet Take 1 tablet (125 mcg total) by mouth daily. 04/22/20  Yes Elby Beck, FNP  Magnesium 500 MG CAPS Take 1 capsule (500 mg total) by mouth 2 (two) times daily at 8 am and 10 pm. 04/16/20 04/16/21 Yes Elby Beck, FNP  metFORMIN (GLUCOPHAGE) 500 MG tablet TAKE 1 TABLET (500 MG TOTAL) BY MOUTH 2 (TWO) TIMES DAILY WITH A MEAL. 02/09/20  Yes Elby Beck, FNP  omeprazole (PRILOSEC) 40 MG capsule Take 1 capsule (40 mg total) by mouth daily. 07/01/20  Yes Teresa Bellows, MD  oxyCODONE (OXY IR/ROXICODONE) 5 MG immediate release tablet Take 1 tablet (5 mg total) by mouth every 8 (eight) hours as needed for severe pain. Must last 30 days 07/14/20 08/13/20 Yes Molli Barrows, MD  Accu-Chek FastClix Lancets MISC USE UP TO 4 TIMES DAILY AS DIRECTED 04/08/19   Elby Beck, FNP  ACCU-CHEK GUIDE test strip USE UP TO 4 TIMES DAILY AS DIRECTED 05/01/19   Elby Beck, FNP  albuterol (VENTOLIN HFA) 108 (90 Base) MCG/ACT inhaler INHALE 2 PUFFS INTO THE LUNGS EVERY 4 HOURS AS NEEDED FOR (COUGH, SHORTNESS OF BREATH OR WHEEZING.). 01/13/20   Elby Beck, FNP  azelastine (  OPTIVAR) 0.05 % ophthalmic solution Place 1 drop into both eyes 2 (two) times daily as needed. 11/11/18   Elby Beck, FNP  blood glucose meter kit and supplies Dispense based on patient and insurance preference. Use up to four times daily as directed. (FOR ICD-10 E10.9, E11.9). 03/07/19   Elby Beck, FNP  fluticasone (FLONASE) 50 MCG/ACT nasal spray SPRAY 2 SPRAYS INTO EACH NOSTRIL EVERY DAY 08/25/19   Elby Beck, FNP  meloxicam (MOBIC) 15 MG tablet Take 1 tablet (15 mg total) by mouth daily. 01/15/20 07/13/20  Milinda Pointer, MD  Olopatadine HCl 0.2 % SOLN INSTILL 1 DROP INTO BOTH EYES EVERY DAY AS NEEDED 03/31/19   [provider]  oxyCODONE (OXY IR/ROXICODONE) 5 MG  immediate release tablet Take 1 tablet (5 mg total) by mouth every 8 (eight) hours as needed for severe pain. Must last 30 days 05/15/20 06/14/20  Molli Barrows, MD  oxyCODONE (OXY IR/ROXICODONE) 5 MG immediate release tablet Take 1 tablet (5 mg total) by mouth every 8 (eight) hours as needed for severe pain. Must last 30 days 06/14/20 07/14/20  Molli Barrows, MD  REFRESH CELLUVISC 1 % GEL APPLY 1 DROP TO EYE 3 (THREE) TIMES DAILY. 11/16/17   [provider]  rosuvastatin (CRESTOR) 10 MG tablet  06/03/20   [provider]  tiZANidine (ZANAFLEX) 4 MG tablet Take 1 tablet (4 mg total) by mouth every 8 (eight) hours as needed for muscle spasms. 01/15/20 07/13/20  Milinda Pointer, MD    Allergies as of 07/02/2020  . (No Known Allergies)    Family History  Problem Relation Age of Onset  . Heart attack Father 31       deceased  . Diabetes Brother   . Stroke Brother 25       twice  . Stroke Sister 43  . Diabetes Sister   . Heart attack Sister 66  . Heart attack Brother 29  . Breast cancer Maternal Aunt     Social History   Socioeconomic History  . Marital status: Married    Spouse name: Not on file  . Number of children: Not on file  . Years of education: Not on file  . Highest education level: Not on file  Occupational History  . Occupation: disabled    Employer: [employer]    Comment: back  Tobacco Use  . Smoking status: Current Every Day Smoker    Packs/day: 0.50    Years: 20.00    Pack years: 10.00    Types: Cigarettes  . Smokeless tobacco: Never Used  . Tobacco comment: used to smoke 1.5 - 2 PPD, now down to 4-5 cigs a day--08/20/2019  Vaping Use  . Vaping Use: Never used  Substance and Sexual Activity  . Alcohol use: No  . Drug use: No  . Sexual activity: Not Currently  Other Topics Concern  . Not on file  Social History Narrative   Married but she and husband separated, she helps take care of him (he is in poor health)   She lives with her grown son  who is mentally disabled   She has been on disability since 2011   Social Determinants of Health   Financial Resource Strain: [place]   . Difficulty of Paying Living Expenses: Not hard at all  Food Insecurity: No Food Insecurity  . Worried About Charity fundraiser in the Last Year: Never true  . Ran Out of Food in the Last Year:  Never true  Transportation Needs: No Transportation Needs  . Lack of Transportation (Medical): No  . Lack of Transportation (Non-Medical): No  Physical Activity: Inactive  . Days of Exercise per Week: 0 days  . Minutes of Exercise per Session: 0 min  Stress: No Stress Concern Present  . Feeling of Stress : Not at all  Social Connections:   . Frequency of Communication with Friends and Family: Not on file  . Frequency of Social Gatherings with Friends and Family: Not on file  . Attends Religious Services: Not on file  . Active Member of Clubs or Organizations: Not on file  . Attends Archivist Meetings: Not on file  . Marital Status: Not on file  Intimate Partner Violence: Not At Risk  . Fear of Current or Ex-Partner: No  . Emotionally Abused: No  . Physically Abused: No  . Sexually Abused: No    Review of Systems: See HPI, otherwise negative ROS  Physical Exam: BP (!) 130/98   Pulse 62   Temp (!) 95.6 F (35.3 C) (Temporal)   Resp 18   Ht 5' 3" (1.6 m)   Wt 111.6 kg   LMP  (LMP Unknown)   SpO2 98%   BMI 43.58 kg/m  General:   Alert,  pleasant and cooperative in NAD Head:  Normocephalic and atraumatic. Neck:  Supple; no masses or thyromegaly. Lungs:  Clear throughout to auscultation, normal respiratory effort.    Heart:  +S1, +S2, Regular rate and rhythm, No edema. Abdomen:  Soft, nontender and nondistended. Normal bowel sounds, without guarding, and without rebound.   Neurologic:  Alert and  oriented x4;  grossly normal neurologically.  Impression/Plan: Teresa Mcgee is here for an endoscopy  to be performed for   evaluation of dysphagia    Risks, benefits, limitations, and alternatives regarding endoscopy have been reviewed with the patient.  Questions have been answered.  All parties agreeable.   Teresa Bellows, MD  07/20/2020, 9:00 AM

## 2020-07-20 NOTE — Anesthesia Procedure Notes (Signed)
Procedure Name: MAC Date/Time: 07/20/2020 9:31 AM Performed by: Lily Peer, Santiago Graf, CRNA Pre-anesthesia Checklist: Patient identified, Emergency Drugs available, Suction available, Patient being monitored and Timeout performed Patient Re-evaluated:Patient Re-evaluated prior to induction Oxygen Delivery Method: Simple face mask Induction Type: IV induction

## 2020-07-20 NOTE — Anesthesia Postprocedure Evaluation (Signed)
Anesthesia Post Note  Patient: Teresa Mcgee  Procedure(s) Performed: ESOPHAGOGASTRODUODENOSCOPY (EGD) WITH PROPOFOL (N/A )  Patient location during evaluation: Endoscopy Anesthesia Type: General Level of consciousness: awake and alert Pain management: pain level controlled Vital Signs Assessment: post-procedure vital signs reviewed and stable Respiratory status: spontaneous breathing, nonlabored ventilation, respiratory function stable and patient connected to nasal cannula oxygen Cardiovascular status: blood pressure returned to baseline and stable Postop Assessment: no apparent nausea or vomiting Anesthetic complications: no   No complications documented.   Last Vitals:  Vitals:   07/20/20 1000 07/20/20 1010  BP: 115/73 106/76  Pulse: 64 60  Resp: 15 13  Temp:    SpO2: 98% 99%    Last Pain:  Vitals:   07/20/20 1010  TempSrc:   PainSc: 0-No pain                 Precious Haws Qunicy Higinbotham

## 2020-07-20 NOTE — Op Note (Signed)
[facility] Gastroenterology Patient Name: Teresa Mcgee Procedure Date: 07/20/2020 9:01 AM MRN: [id number] Account #: [id number] Date of Birth: Jun 24, 1966 Admit Type: Outpatient Age: 54 [facility] ENDO ROOM 2 Gender: Female Note Status: Finalized Procedure:             Upper GI endoscopy Indications:           Dysphagia Providers:             Jonathon Bellows MD, MD Referring MD:          Elby Beck (Referring MD) Medicines:             Monitored Anesthesia Care Complications:         No immediate complications. Procedure:             Pre-Anesthesia Assessment:                        - Prior to the procedure, a History and Physical was                         performed, and patient medications, allergies and                         sensitivities were reviewed. The patient's tolerance                         of previous anesthesia was reviewed.                        - The risks and benefits of the procedure and the                         sedation options and risks were discussed with the                         patient. All questions were answered and informed                         consent was obtained.                        - ASA Grade Assessment: II - A patient with mild                         systemic disease.                        After obtaining informed consent, the endoscope was                         passed under direct vision. Throughout the procedure,                         the patient's blood pressure, pulse, and oxygen                         saturations were monitored continuously. The Endoscope                         was introduced through the mouth, and advanced to  the                         third part of duodenum. The upper GI endoscopy was                         accomplished with ease. The patient tolerated the                         procedure well. Findings:      The examined duodenum was normal.      Localized moderate  inflammation characterized by congestion (edema),       erosions and erythema was found in the gastric antrum. Biopsies were       taken with a cold forceps for histology.      A mild Schatzki ring was found at the gastroesophageal junction. A TTS       dilator was passed through the scope. Dilation with a 15-16.5-18 mm       balloon dilator was performed to 18 mm. The dilation site was examined       and showed no change. Biopsies were taken with a cold forceps for       histology. Impression:            - Normal examined duodenum.                        - Gastritis. Biopsied.                        - Mild Schatzki ring. Dilated. Biopsied. Recommendation:        - Discharge patient to home (with escort).                        - Resume previous diet.                        - Continue present medications.                        - Await pathology results.                        - Return to my office in 6 weeks. Procedure Code(s):     --- Professional ---                        [phone], Esophagogastroduodenoscopy, flexible,                         transoral; with transendoscopic balloon dilation of                         esophagus (less than 30 mm diameter)                        43239, 59, Esophagogastroduodenoscopy, flexible,                         transoral; with biopsy, single or multiple Diagnosis Code(s):     --- Professional ---  K29.70, Gastritis, unspecified, without bleeding                        K22.2, Esophageal obstruction                        R13.10, Dysphagia, unspecified CPT copyright 2019 American Medical Association. All rights reserved. The codes documented in this report are preliminary and upon coder review may  be revised to meet current compliance requirements. Jonathon Bellows, MD Jonathon Bellows MD, MD 07/20/2020 9:35:43 AM This report has been signed electronically. Number of Addenda: 0 Note Initiated On: 07/20/2020 9:01 AM Estimated Blood Loss:   Estimated blood loss: none.      [facility]

## 2020-07-20 NOTE — Anesthesia Preprocedure Evaluation (Signed)
Anesthesia Evaluation  Patient identified by MRN, date of birth, ID band Patient awake    Reviewed: Allergy & Precautions, NPO status , Patient's Chart, lab work & pertinent test results  History of Anesthesia Complications Negative for: history of anesthetic complications  Airway Mallampati: III  TM Distance: >3 FB Neck ROM: limited    Dental  (+) Edentulous Upper, Edentulous Lower   Pulmonary shortness of breath and with exertion, sleep apnea , Current Smoker,    Pulmonary exam normal        Cardiovascular hypertension, Normal cardiovascular exam     Neuro/Psych PSYCHIATRIC DISORDERS Depression  Neuromuscular disease    GI/Hepatic Neg liver ROS, hiatal hernia, GERD  ,  Endo/Other  diabetesHypothyroidism Morbid obesity  Renal/GU negative Renal ROS  negative genitourinary   Musculoskeletal  (+) Arthritis , Osteoarthritis,    Abdominal   Peds negative pediatric ROS (+)  Hematology negative hematology ROS (+)   Anesthesia Other Findings Past Medical History: No date: Arthritis may 2012: Bell's palsy No date: Chest pain No date: Diabetes mellitus without complication (HCC) No date: GERD (gastroesophageal reflux disease) No date: H/O hiatal hernia No date: Hyperlipidemia No date: Hypertension No date: Hypothyroidism No date: Shingles No date: Shortness of breath No date: Sleep apnea     Comment:  uses cpap  Reproductive/Obstetrics                             Anesthesia Physical  Anesthesia Plan  ASA: III  Anesthesia Plan: General   Post-op Pain Management:    Induction: Intravenous  PONV Risk Score and Plan: Propofol infusion  Airway Management Planned: Nasal Cannula  Additional Equipment:   Intra-op Plan:   Post-operative Plan:   Informed Consent: I have reviewed the patients History and Physical, chart, labs and discussed the procedure including the risks, benefits  and alternatives for the proposed anesthesia with the patient or authorized representative who has indicated his/her understanding and acceptance.     Dental advisory given  Plan Discussed with: CRNA and Surgeon  Anesthesia Plan Comments: (Patient consented for risks of anesthesia including but not limited to:  - adverse reactions to medications - risk of airway placement if required - damage to eyes, teeth, lips or other oral mucosa - nerve damage due to positioning  - sore throat or hoarseness - Damage to heart, brain, nerves, lungs, other parts of body or loss of life  Patient voiced understanding.)        Anesthesia Quick Evaluation

## 2020-07-20 NOTE — Transfer of Care (Signed)
Immediate Anesthesia Transfer of Care Note  Patient: Teresa Mcgee  Procedure(s) Performed: ESOPHAGOGASTRODUODENOSCOPY (EGD) WITH PROPOFOL (N/A )  Patient Location: PACU and Endoscopy Unit  Anesthesia Type:General  Level of Consciousness: drowsy  Airway & Oxygen Therapy: Patient Spontanous Breathing  Post-op Assessment: Report given to RN and Post -op Vital signs reviewed and stable  Post vital signs: Reviewed and stable  Last Vitals:  Vitals Value Taken Time  BP 112/68 07/20/20 0940  Temp    Pulse 81 07/20/20 0941  Resp 23 07/20/20 0941  SpO2 94 % 07/20/20 0941  Vitals shown include unvalidated device data.  Last Pain:  Vitals:   07/20/20 0846  TempSrc: Temporal  PainSc: 4          Complications: No complications documented.

## 2020-07-21 ENCOUNTER — Encounter: Payer: Self-pay | Admitting: Gastroenterology

## 2020-07-21 LAB — SURGICAL PATHOLOGY

## 2020-07-29 ENCOUNTER — Encounter: Payer: Self-pay | Admitting: Gastroenterology

## 2020-08-03 ENCOUNTER — Other Ambulatory Visit: Payer: Self-pay | Admitting: Pain Medicine

## 2020-08-03 DIAGNOSIS — M159 Polyosteoarthritis, unspecified: Secondary | ICD-10-CM

## 2020-08-10 NOTE — Progress Notes (Signed)
PROVIDER NOTE: Information contained herein reflects review and annotations entered in association with encounter. Interpretation of such information and data should be left to medically-trained personnel. Information provided to patient can be located elsewhere in the medical record under "Patient Instructions". Document created using STT-dictation technology, any transcriptional errors that may result from process are unintentional.    Patient: Teresa Mcgee  Service Category: E/M  Provider: Gaspar Cola, MD  DOB: September 03, 1965  DOS: 08/11/2020  Specialty: Interventional Pain Management  MRN: [id number]  Setting: Ambulatory outpatient  PCP: Elby Beck, FNP  Type: Established Patient    Referring Provider: Elby Beck, FNP  Location: Office  Delivery: Face-to-face     HPI  [name], a 54 y.o. year old female, is here today because of her Chronic pain syndrome [G89.4]. Teresa Mcgee primary complain today is Back Pain (low) Last encounter: My last encounter with her was on 08/03/2020. Pertinent problems: Teresa Mcgee has Muscle spasms of neck; Numbness and tingling in both hands; Chronic low back pain (Bilateral) (L>R) w/ sciatica (Bilateral); Chronic lower extremity pain (2ry area of Pain) (Bilateral) (L>R); Chronic neck pain (3ry area of Pain) (Bilateral) (L>R); Chronic pain syndrome; Chronic upper extremity pain (4th area of Pain) (Bilateral) (L>R); Cervicalgia; Chronic musculoskeletal pain; DDD (degenerative disc disease), thoracic; DDD (degenerative disc disease), cervical; Lumbar facet arthropathy (Bilateral); Lumbar facet syndrome (Bilateral); Chronic low back pain (1ry area of Pain) (Bilateral) (L>R) w/o sciatica; Spondylosis without myelopathy or radiculopathy, cervical region; Spondylosis without myelopathy or radiculopathy, lumbosacral region; DDD (degenerative disc disease), lumbosacral; Strain of lumbar paraspinal muscle, sequela; Chronic hip pain (Bilateral)  (R>L); Chronic sacroiliac joint pain (Left); Neurogenic pain; Osteoarthritis involving multiple joints; and Chronic knee pain (Bilateral) (R>L) on their pertinent problem list. Pain Assessment: Severity of Chronic pain is reported as a 4 /10. Location: Back Lower/radiates down left leg to foot. Onset: More than a month ago. Quality: Aching. Timing: Constant. Modifying factor(s): lying and sitting, repositioning. Vitals:  height is '5\' 3"'  (1.6 m) and weight is 243 lb (110.2 kg). Her temperature is 96.7 F (35.9 C) (abnormal). Her blood pressure is 126/71 and her pulse is 77. Her respiration is 18 and oxygen saturation is 100%.   Reason for encounter: medication management. The patient indicates doing well with the current medication regimen. No adverse reactions or side effects reported to the medications. PMP & UDS compliant.   RTCB: 11/11/2020 Nonopioids transfer 08/11/2020: Gabapentin, meloxicam, and Zanaflex  Pharmacotherapy Assessment   Analgesic: OxycodoneIR5 mg, 1 tab PO q 8 hrs (PRN) (15 mg/day of oxycodone) MME/day:22.30m/day.   Monitoring: [place] PMP: PDMP reviewed during this encounter.       Pharmacotherapy: No side-effects or adverse reactions reported. Compliance: No problems identified. Effectiveness: Clinically acceptable.  TDewayne Shorter RN  08/11/2020  2:06 PM  Sign when Signing Visit Nursing Pain Medication Assessment:  Safety precautions to be maintained throughout the outpatient stay will include: orient to surroundings, keep bed in low position, maintain call bell within reach at all times, provide assistance with transfer out of bed and ambulation.  Medication Inspection Compliance: Pill count conducted under aseptic conditions, in front of the patient. Neither the pills nor the bottle was removed from the patient's sight at any time. Once count was completed pills were immediately returned to the patient in their original bottle.  Medication: Oxycodone IR Pill/Patch  Count: 3 of 90 pills remain Pill/Patch Appearance: Markings consistent with prescribed medication Bottle Appearance: Standard pharmacy container. Clearly  labeled. Filled Date: 78 / 01/ 2021 Last Medication intake:  Yesterday    UDS:  Summary  Date Value Ref Range Status  02/11/2020 Note  Final    Comment:    ==================================================================== [employer] Select 13 (MW) ==================================================================== Test                             Result       Flag       Units  Drug Present and Declared for Prescription Verification   Oxycodone                      142          EXPECTED   ng/mg creat   Oxymorphone                    603          EXPECTED   ng/mg creat   Noroxycodone                   220          EXPECTED   ng/mg creat   Noroxymorphone                 84           EXPECTED   ng/mg creat    Sources of oxycodone are scheduled prescription medications.    Oxymorphone, noroxycodone, and noroxymorphone are expected    metabolites of oxycodone. Oxymorphone is also available as a    scheduled prescription medication.  ==================================================================== Test                      Result    Flag   Units      Ref Range   Creatinine              76               mg/dL      >=20 ==================================================================== Declared Medications:  The flagging and interpretation on this report are based on the  following declared medications.  Unexpected results may arise from  inaccuracies in the declared medications.   **Note: The testing scope of this panel includes these medications:   Oxycodone (Roxicodone)   **Note: The testing scope of this panel does not include the  following reported medications:   Albuterol (Ventolin HFA)  Amlodipine (Norvasc)  Azelastine  Eye Drop  Ezetimibe (Zetia)  Fluticasone (Flonase)  Gabapentin (Neurontin)   Hydrochlorothiazide (Hydrodiuril)  Levocetirizine (Xyzal)  Levothyroxine (Synthroid)  Magnesium  Meloxicam (Mobic)  Metformin (Glucophage)  Olopatadine  Tizanidine (Zanaflex)  Vitamin D3 ==================================================================== For clinical consultation, please call [phone]. ====================================================================      ROS  Constitutional: Denies any fever or chills Gastrointestinal: No reported hemesis, hematochezia, vomiting, or acute GI distress Musculoskeletal: Denies any acute onset joint swelling, redness, loss of ROM, or weakness Neurological: No reported episodes of acute onset apraxia, aphasia, dysarthria, agnosia, amnesia, paralysis, loss of coordination, or loss of consciousness  Medication Review  Accu-Chek FastClix Lancets, Carboxymethylcellulose Sod PF, Cholecalciferol, Magnesium, Olopatadine HCl, albuterol, amLODipine, azelastine, blood glucose meter kit and supplies, ezetimibe, fluticasone, gabapentin, glucose blood, hydrochlorothiazide, levocetirizine, levothyroxine, meloxicam, metFORMIN, omeprazole, oxyCODONE, rosuvastatin, and tiZANidine  History Review  Allergy: Teresa Mcgee has No Known Allergies. Drug: Teresa Mcgee  reports no history of drug use. Alcohol:  reports no history of alcohol use. Tobacco:  reports that she has been smoking cigarettes. She has a 10.00 pack-year smoking history. She has never used smokeless tobacco. Social: Teresa Mcgee  reports that she has been smoking cigarettes. She has a 10.00 pack-year smoking history. She has never used smokeless tobacco. She reports that she does not drink alcohol and does not use drugs. Medical:  has a past medical history of Arthritis, Bell's palsy (may 2012), Chest pain, Diabetes mellitus without complication ([place]), GERD (gastroesophageal reflux disease), H/O hiatal hernia, Hyperlipidemia, Hypertension, Hypothyroidism, Shingles, Shortness of breath, and  Sleep apnea. Surgical: Teresa Mcgee  has a past surgical history that includes Thyroidectomy (1998); cesarian; Partial hysterectomy (2000); Cesarean section; Tubal ligation; Tooth extraction; Abdominal hysterectomy; Colonoscopy with propofol (N/A, 11/27/2019); and Esophagogastroduodenoscopy (egd) with propofol (N/A, 07/20/2020). Family: family history includes Breast cancer in her maternal aunt; Diabetes in her brother and sister; Heart attack (age of onset: 37) in her brother; Heart attack (age of onset: 20) in her sister; Heart attack (age of onset: 63) in her father; Stroke (age of onset: 24) in her brother; Stroke (age of onset: 40) in her sister.  Laboratory Chemistry Profile   Renal Lab Results  Component Value Date   BUN 8 04/16/2020   CREATININE 0.88 04/16/2020   BCR 8 (L) 01/24/2018   GFR 81.11 04/16/2020   GFRAA >60 05/25/2018   GFRNONAA >60 05/25/2018     Hepatic Lab Results  Component Value Date   AST 64 (H) 04/16/2020   ALT 66 (H) 04/16/2020   ALBUMIN 4.5 04/16/2020   ALKPHOS 64 04/16/2020   LIPASE 25 05/25/2018     Electrolytes Lab Results  Component Value Date   NA 141 04/16/2020   K 3.6 04/16/2020   CL 100 04/16/2020   CALCIUM 10.0 04/16/2020   MG 2.0 01/24/2018     Bone Lab Results  Component Value Date   VD25OH 41.99 04/16/2020   25OHVITD1 9.3 (L) 01/24/2018   25OHVITD2 <1.0 01/24/2018   25OHVITD3 9.3 01/24/2018     Inflammation (CRP: Acute Phase) (ESR: Chronic Phase) Lab Results  Component Value Date   CRP 5.5 (H) 01/24/2018   ESRSEDRATE 40 01/24/2018       Note: Above Lab results reviewed.  Recent Imaging Review  MM 3D SCREEN BREAST BILATERAL CLINICAL DATA:  Screening.  EXAM: DIGITAL SCREENING BILATERAL MAMMOGRAM WITH TOMO AND CAD  COMPARISON:  None.  ACR Breast Density Category a: The breast tissue is almost entirely fatty.  FINDINGS: There are no findings suspicious for malignancy. Images were processed with CAD.  IMPRESSION: No  mammographic evidence of malignancy. A result letter of this screening mammogram will be mailed directly to the patient.  RECOMMENDATION: Screening mammogram in one year. (Code:SM-B-01Y)  BI-RADS CATEGORY  1: Negative.  Electronically Signed   By: Audie Pinto M.D.   On: 11/10/2019 10:40 Note: Reviewed        Physical Exam  General appearance: Well nourished, well developed, and well hydrated. In no apparent acute distress Mental status: Alert, oriented x 3 (person, place, & time)       Respiratory: No evidence of acute respiratory distress Eyes: PERLA Vitals: BP 126/71   Pulse 77   Temp (!) 96.7 F (35.9 C)   Resp 18   Ht '5\' 3"'  (1.6 m)   Wt 243 lb (110.2 kg)   LMP  (LMP Unknown)   SpO2 100%   BMI 43.05 kg/m  BMI: Estimated body mass index is 43.05 kg/m as calculated from the following:  Height as of this encounter: '5\' 3"'  (1.6 m).   Weight as of this encounter: 243 lb (110.2 kg). Ideal: Ideal body weight: 52.4 kg (115 lb 8.3 oz) Adjusted ideal body weight: 75.5 kg (166 lb 8.2 oz)  Assessment   Status Diagnosis  Controlled Controlled Controlled 1. Chronic pain syndrome   2. Chronic low back pain (1ry area of Pain) (Bilateral) (L>R) w/o sciatica   3. Chronic lower extremity pain (2ry area of Pain) (Bilateral) (L>R)   4. Chronic neck pain (3ry area of Pain) (Bilateral) (L>R)   5. Chronic upper extremity pain (4th area of Pain) (Bilateral) (L>R)   6. Pharmacologic therapy   7. Uncomplicated opioid dependence ([place])   8. Chronic musculoskeletal pain   9. Muscle spasms of neck   10. Neurogenic pain   11. Osteoarthritis involving multiple joints      Updated Problems: No problems updated.  Plan of Care  Problem-specific:  No problem-specific Assessment & Plan notes found for this encounter.  Teresa Mcgee has a current medication list which includes the following long-term medication(s): albuterol, amlodipine, ezetimibe, fluticasone,  hydrochlorothiazide, levocetirizine, levothyroxine, magnesium, metformin, omeprazole, gabapentin, meloxicam, [START ON 08/13/2020] oxycodone, [START ON 09/12/2020] oxycodone, [START ON 10/12/2020] oxycodone, and tizanidine.  Pharmacotherapy (Medications Ordered): Meds ordered this encounter  Medications  . oxyCODONE (OXY IR/ROXICODONE) 5 MG immediate release tablet    Sig: Take 1 tablet (5 mg total) by mouth every 8 (eight) hours as needed for severe pain. Must last 30 days    Dispense:  90 tablet    Refill:  0    Chronic Pain: STOP Act (Not applicable) Fill 1 day early if closed on refill date. 1. Avoid benzodiazepines within 8 hours of opioids  . oxyCODONE (OXY IR/ROXICODONE) 5 MG immediate release tablet    Sig: Take 1 tablet (5 mg total) by mouth every 8 (eight) hours as needed for severe pain. Must last 30 days    Dispense:  90 tablet    Refill:  0    Chronic Pain: STOP Act (Not applicable) Fill 1 day early if closed on refill date. 1. Avoid benzodiazepines within 8 hours of opioids  . oxyCODONE (OXY IR/ROXICODONE) 5 MG immediate release tablet    Sig: Take 1 tablet (5 mg total) by mouth every 8 (eight) hours as needed for severe pain. Must last 30 days    Dispense:  90 tablet    Refill:  0    Chronic Pain: STOP Act (Not applicable) Fill 1 day early if closed on refill date. 1. Avoid benzodiazepines within 8 hours of opioids  . tiZANidine (ZANAFLEX) 4 MG tablet    Sig: Take 1 tablet (4 mg total) by mouth every 8 (eight) hours as needed for muscle spasms.    Dispense:  90 tablet    Refill:  2    Fill one day early if pharmacy is closed on scheduled refill date. May substitute for generic if available.  . gabapentin (NEURONTIN) 100 MG capsule    Sig: Take 1 capsule (100 mg total) by mouth 2 (two) times daily AND 3 capsules (300 mg total) at bedtime.    Dispense:  150 capsule    Refill:  2    Fill one day early if pharmacy is closed on scheduled refill date. May substitute for generic if  available.  . meloxicam (MOBIC) 15 MG tablet    Sig: Take 1 tablet (15 mg total) by mouth daily.    Dispense:  30  tablet    Refill:  2    Fill one day early if pharmacy is closed on scheduled refill date. May substitute for generic if available.   Orders:  No orders of the defined types were placed in this encounter.  Follow-up plan:   Return in about 3 months (around 11/11/2020) for (F2F), (Med Mgmt).      Interventional management options:  Considering: NOTE: NO RFA until BMI <35  Possible bilateral lumbar facet RFA Diagnosticleft-sided sacroiliac joint block Possible left-sided sacroiliac joint RFA Diagnostic bilateral IA hip joint injection Diagnostic bilateral femoral nerve + obturator NB Possible bilateral femoral nerve + obturator nerve RFA Diagnostic left-sided LESI Diagnostic bilateral transforaminal ESI Diagnostic left-sided CESI Diagnostic bilateral cervical facet block Possible bilateral cervical facet RFA Diagnostic bilateralIAknee joint injectionswith local anesthetic and steroid Possible series of 5 bilateral intra-articular Hyalgan knee injections Diagnostic bilateral genicular nerve blocks Possible bilateral genicular nerve RFA Diagnostic trigger point injections   Palliative PRN treatment(s): Palliativebilateral lumbar facet block     Recent Visits No visits were found meeting these conditions. Showing recent visits within past 90 days and meeting all other requirements Today's Visits Date Type Provider Dept  08/11/20 Office Visit Milinda Pointer, MD [facility]  Showing today's visits and meeting all other requirements Future Appointments Date Type Provider Dept  11/08/20 Appointment Milinda Pointer, MD [facility]  Showing future appointments within next 90 days and meeting all other requirements  I discussed the assessment and treatment plan with the patient. The patient was provided an opportunity  to ask questions and all were answered. The patient agreed with the plan and demonstrated an understanding of the instructions.  Patient advised to call back or seek an in-person evaluation if the symptoms or condition worsens.  Duration of encounter: 30 minutes.  Note by: Gaspar Cola, MD Date: 08/11/2020; Time: 3:15 PM

## 2020-08-11 ENCOUNTER — Encounter: Payer: Self-pay | Admitting: Pain Medicine

## 2020-08-11 ENCOUNTER — Ambulatory Visit: Payer: Medicare HMO | Attending: Pain Medicine | Admitting: Pain Medicine

## 2020-08-11 ENCOUNTER — Other Ambulatory Visit: Payer: Self-pay

## 2020-08-11 VITALS — BP 126/71 | HR 77 | Temp 96.7°F | Resp 18 | Ht 63.0 in | Wt 243.0 lb

## 2020-08-11 DIAGNOSIS — F1120 Opioid dependence, uncomplicated: Secondary | ICD-10-CM | POA: Diagnosis present

## 2020-08-11 DIAGNOSIS — M79605 Pain in left leg: Secondary | ICD-10-CM | POA: Diagnosis present

## 2020-08-11 DIAGNOSIS — M545 Low back pain, unspecified: Secondary | ICD-10-CM | POA: Diagnosis present

## 2020-08-11 DIAGNOSIS — G8929 Other chronic pain: Secondary | ICD-10-CM

## 2020-08-11 DIAGNOSIS — M62838 Other muscle spasm: Secondary | ICD-10-CM | POA: Diagnosis present

## 2020-08-11 DIAGNOSIS — M79604 Pain in right leg: Secondary | ICD-10-CM | POA: Insufficient documentation

## 2020-08-11 DIAGNOSIS — M792 Neuralgia and neuritis, unspecified: Secondary | ICD-10-CM | POA: Insufficient documentation

## 2020-08-11 DIAGNOSIS — M79601 Pain in right arm: Secondary | ICD-10-CM | POA: Diagnosis present

## 2020-08-11 DIAGNOSIS — M7918 Myalgia, other site: Secondary | ICD-10-CM

## 2020-08-11 DIAGNOSIS — M542 Cervicalgia: Secondary | ICD-10-CM | POA: Insufficient documentation

## 2020-08-11 DIAGNOSIS — Z79899 Other long term (current) drug therapy: Secondary | ICD-10-CM | POA: Insufficient documentation

## 2020-08-11 DIAGNOSIS — G894 Chronic pain syndrome: Secondary | ICD-10-CM | POA: Diagnosis present

## 2020-08-11 DIAGNOSIS — M159 Polyosteoarthritis, unspecified: Secondary | ICD-10-CM

## 2020-08-11 DIAGNOSIS — M8949 Other hypertrophic osteoarthropathy, multiple sites: Secondary | ICD-10-CM

## 2020-08-11 DIAGNOSIS — M79602 Pain in left arm: Secondary | ICD-10-CM

## 2020-08-11 MED ORDER — TIZANIDINE HCL 4 MG PO TABS: 4.0000 mg | ORAL_TABLET | Freq: Three times a day (TID) | ORAL | 2 refills | Status: DC | PRN

## 2020-08-11 MED ORDER — MELOXICAM 15 MG PO TABS: 15.0000 mg | ORAL_TABLET | Freq: Every day | ORAL | 2 refills | Status: DC

## 2020-08-11 MED ORDER — OXYCODONE HCL 5 MG PO TABS: 5.0000 mg | ORAL_TABLET | Freq: Three times a day (TID) | ORAL | 0 refills | Status: DC | PRN

## 2020-08-11 MED ORDER — GABAPENTIN 100 MG PO CAPS: ORAL_CAPSULE | ORAL | 2 refills | Status: DC

## 2020-08-11 NOTE — Patient Instructions (Signed)
____________________________________________________________________________________________  Medication Rules  Purpose: To inform patients, and their family members, of our rules and regulations.  Applies to: All patients receiving prescriptions (written or electronic).  Pharmacy of record: Pharmacy where electronic prescriptions will be sent. If written prescriptions are taken to a different pharmacy, please inform the nursing staff. The pharmacy listed in the electronic medical record should be the one where you would like electronic prescriptions to be sent.  Electronic prescriptions: In compliance with the [place] Strengthen Opioid Misuse Prevention (STOP) Act of 2017 (Session Law 2017-74/H243), effective August 14, 2018, all controlled substances must be electronically prescribed. Calling prescriptions to the pharmacy will cease to exist.  Prescription refills: Only during scheduled appointments. Applies to all prescriptions.  NOTE: The following applies primarily to controlled substances (Opioid* Pain Medications).   Type of encounter (visit): For patients receiving controlled substances, face-to-face visits are required. (Not an option or up to the patient.)  Patient's responsibilities: 1. Pain Pills: Bring all pain pills to every appointment (except for procedure appointments). 2. Pill Bottles: Bring pills in original pharmacy bottle. Always bring the newest bottle. Bring bottle, even if empty. 3. Medication refills: You are responsible for knowing and keeping track of what medications you take and those you need refilled. The day before your appointment: write a list of all prescriptions that need to be refilled. The day of the appointment: give the list to the admitting nurse. Prescriptions will be written only during appointments. No prescriptions will be written on procedure days. If you forget a medication: it will not be "Called in", "Faxed", or "electronically sent".  You will need to get another appointment to get these prescribed. No early refills. Do not call asking to have your prescription filled early. 4. Prescription Accuracy: You are responsible for carefully inspecting your prescriptions before leaving our office. Have the discharge nurse carefully go over each prescription with you, before taking them home. Make sure that your name is accurately spelled, that your address is correct. Check the name and dose of your medication to make sure it is accurate. Check the number of pills, and the written instructions to make sure they are clear and accurate. Make sure that you are given enough medication to last until your next medication refill appointment. 5. Taking Medication: Take medication as prescribed. When it comes to controlled substances, taking less pills or less frequently than prescribed is permitted and encouraged. Never take more pills than instructed. Never take medication more frequently than prescribed.  6. Inform other Doctors: Always inform, all of your healthcare providers, of all the medications you take. 7. Pain Medication from other Providers: You are not allowed to accept any additional pain medication from any other Doctor or Healthcare provider. There are two exceptions to this rule. (see below) In the event that you require additional pain medication, you are responsible for notifying us, as stated below. 8. Cough Medicine: Often these contain an opioid, such as codeine or hydrocodone. Never accept or take cough medicine containing these opioids if you are already taking an opioid* medication. The combination may cause respiratory failure and death. 9. Medication Agreement: You are responsible for carefully reading and following our Medication Agreement. This must be signed before receiving any prescriptions from our practice. Safely store a copy of your signed Agreement. Violations to the Agreement will result in no further prescriptions.  (Additional copies of our Medication Agreement are available upon request.) 10. Laws, Rules, & Regulations: All patients are expected to follow all   Federal and State Laws, Statutes, Rules, & Regulations. Ignorance of the Laws does not constitute a valid excuse.  11. Illegal drugs and Controlled Substances: The use of illegal substances (including, but not limited to marijuana and its derivatives) and/or the illegal use of any controlled substances is strictly prohibited. Violation of this rule may result in the immediate and permanent discontinuation of any and all prescriptions being written by our practice. The use of any illegal substances is prohibited. 12. Adopted CDC guidelines & recommendations: Target dosing levels will be at or below 60 MME/day. Use of benzodiazepines** is not recommended.  Exceptions: There are only two exceptions to the rule of not receiving pain medications from other Healthcare Providers. 1. Exception #1 (Emergencies): In the event of an emergency (i.e.: accident requiring emergency care), you are allowed to receive additional pain medication. However, you are responsible for: As soon as you are able, call our office [phone], at any time of the day or night, and leave a message stating your name, the date and nature of the emergency, and the name and dose of the medication prescribed. In the event that your call is answered by a member of our staff, make sure to document and save the date, time, and the name of the person that took your information.  2. Exception #2 (Planned Surgery): In the event that you are scheduled by another doctor or dentist to have any type of surgery or procedure, you are allowed (for a period no longer than 30 days), to receive additional pain medication, for the acute post-op pain. However, in this case, you are responsible for picking up a copy of our "Post-op Pain Management for Surgeons" handout, and giving it to your surgeon or dentist. This  document is available at our office, and does not require an appointment to obtain it. Simply go to our office during business hours (Monday-Thursday from 8:00 AM to 4:00 PM) (Friday 8:00 AM to 12:00 Noon) or if you have a scheduled appointment with us, prior to your surgery, and ask for it by name. In addition, you are responsible for: calling our office [phone], at any time of the day or night, and leaving a message stating your name, name of your surgeon, type of surgery, and date of procedure or surgery. Failure to comply with your responsibilities may result in termination of therapy involving the controlled substances.  *Opioid medications include: morphine, codeine, oxycodone, oxymorphone, hydrocodone, hydromorphone, meperidine, tramadol, tapentadol, buprenorphine, fentanyl, methadone. **Benzodiazepine medications include: diazepam (Valium), alprazolam (Xanax), clonazepam (Klonopine), lorazepam (Ativan), clorazepate (Tranxene), chlordiazepoxide (Librium), estazolam (Prosom), oxazepam (Serax), temazepam (Restoril), triazolam (Halcion) (Last updated: 07/12/2020) ____________________________________________________________________________________________   ____________________________________________________________________________________________  Medication Recommendations and Reminders  Applies to: All patients receiving prescriptions (written and/or electronic).  Medication Rules & Regulations: These rules and regulations exist for your safety and that of others. They are not flexible and neither are we. Dismissing or ignoring them will be considered "non-compliance" with medication therapy, resulting in complete and irreversible termination of such therapy. (See document titled "Medication Rules" for more details.) In all conscience, because of safety reasons, we cannot continue providing a therapy where the patient does not follow instructions.  Pharmacy of record:   Definition:  This is the pharmacy where your electronic prescriptions will be sent.   We do not endorse any particular pharmacy, however, we have experienced problems with [facility] not securing enough medication supply for the community.  We do not restrict you in your choice of pharmacy. However,   once we write for your prescriptions, we will NOT be re-sending more prescriptions to fix restricted supply problems created by your pharmacy, or your insurance.   The pharmacy listed in the electronic medical record should be the one where you want electronic prescriptions to be sent.  If you choose to change pharmacy, simply notify our nursing staff.  Recommendations:  Keep all of your pain medications in a safe place, under lock and key, even if you live alone. We will NOT replace lost, stolen, or damaged medication.  After you fill your prescription, take 1 week's worth of pills and put them away in a safe place. You should keep a separate, properly labeled bottle for this purpose. The remainder should be kept in the original bottle. Use this as your primary supply, until it runs out. Once it's gone, then you know that you have 1 week's worth of medicine, and it is time to come in for a prescription refill. If you do this correctly, it is unlikely that you will ever run out of medicine.  To make sure that the above recommendation works, it is very important that you make sure your medication refill appointments are scheduled at least 1 week before you run out of medicine. To do this in an effective manner, make sure that you do not leave the office without scheduling your next medication management appointment. Always ask the nursing staff to show you in your prescription , when your medication will be running out. Then arrange for the receptionist to get you a return appointment, at least 7 days before you run out of medicine. Do not wait until you have 1 or 2 pills left, to come in. This is very poor planning and  does not take into consideration that we may need to cancel appointments due to bad weather, sickness, or emergencies affecting our staff.  DO NOT ACCEPT A "Partial Fill": If for any reason your pharmacy does not have enough pills/tablets to completely fill or refill your prescription, do not allow for a "partial fill". The law allows the pharmacy to complete that prescription within 72 hours, without requiring a new prescription. If they do not fill the rest of your prescription within those 72 hours, you will need a separate prescription to fill the remaining amount, which we will NOT provide. If the reason for the partial fill is your insurance, you will need to talk to the pharmacist about payment alternatives for the remaining tablets, but again, DO NOT ACCEPT A PARTIAL FILL, unless you can trust your pharmacist to obtain the remainder of the pills within 72 hours.  Prescription refills and/or changes in medication(s):   Prescription refills, and/or changes in dose or medication, will be conducted only during scheduled medication management appointments. (Applies to both, written and electronic prescriptions.)  No refills on procedure days. No medication will be changed or started on procedure days. No changes, adjustments, and/or refills will be conducted on a procedure day. Doing so will interfere with the diagnostic portion of the procedure.  No phone refills. No medications will be "called into the pharmacy".  No Fax refills.  No weekend refills.  No Holliday refills.  No after hours refills.  Remember:  Business hours are:  Monday to Thursday 8:00 AM to 4:00 PM Provider's Schedule: Kyann Heydt, MD - Appointments are:  Medication management: Monday and Wednesday 8:00 AM to 4:00 PM Procedure day: Tuesday and Thursday 7:30 AM to 4:00 PM Bilal Lateef, MD - Appointments are:    Medication management: Tuesday and Thursday 8:00 AM to 4:00 PM Procedure day: Monday and Wednesday  7:30 AM to 4:00 PM (Last update: 03/03/2020) ____________________________________________________________________________________________   ____________________________________________________________________________________________  CBD (cannabidiol) WARNING  Applicable to: All individuals currently taking or considering taking CBD (cannabidiol) and, more important, all patients taking opioid analgesic controlled substances (pain medication). (Example: oxycodone; oxymorphone; hydrocodone; hydromorphone; morphine; methadone; tramadol; tapentadol; fentanyl; buprenorphine; butorphanol; dextromethorphan; meperidine; codeine; etc.)  Legal status: CBD remains a Schedule I drug prohibited for any use. CBD is illegal with one exception. In the [place], CBD has a limited Food and Drug Administration (FDA) approval for the treatment of two specific types of epilepsy disorders. Only one CBD product has been approved by the FDA for this purpose: "Epidiolex". FDA is aware that some companies are marketing products containing cannabis and cannabis-derived compounds in ways that violate the Federal Food, Drug and Cosmetic Act (FD&C Act) and that may put the health and safety of consumers at risk. The FDA, a Federal agency, has not enforced the CBD status since 2018.   Legality: Some manufacturers ship CBD products nationally, which is illegal. Often such products are sold online and are therefore available throughout the country. CBD is openly sold in head shops and health food stores in some states where such sales have not been explicitly legalized. Selling unapproved products with unsubstantiated therapeutic claims is not only a violation of the law, but also can put patients at risk, as these products have not been proven to be safe or effective. Federal illegality makes it difficult to conduct research on CBD.  Reference: "FDA Regulation of Cannabis and Cannabis-Derived Products, Including Cannabidiol  (CBD)" - https://www.fda.gov/news-events/public-health-focus/fda-regulation-cannabis-and-cannabis-derived-products-including-cannabidiol-cbd  Warning: CBD is not FDA approved and has not undergo the same manufacturing controls as prescription drugs.  This means that the purity and safety of available CBD may be questionable. Most of the time, despite manufacturer's claims, it is contaminated with THC (delta-9-tetrahydrocannabinol - the chemical in marijuana responsible for the "HIGH").  When this is the case, the THC contaminant will trigger a positive urine drug screen (UDS) test for Marijuana (carboxy-THC). Because a positive UDS for any illicit substance is a violation of our medication agreement, your opioid analgesics (pain medicine) may be permanently discontinued.  MORE ABOUT CBD  General Information: CBD  is a derivative of the Marijuana (cannabis sativa) plant discovered in 1940. It is one of the 113 identified substances found in Marijuana. It accounts for up to 40% of the plant's extract. As of 2018, preliminary clinical studies on CBD included research for the treatment of anxiety, movement disorders, and pain. CBD is available and consumed in multiple forms, including inhalation of smoke or vapor, as an aerosol spray, and by mouth. It may be supplied as an oil containing CBD, capsules, dried cannabis, or as a liquid solution. CBD is thought not to be as psychoactive as THC (delta-9-tetrahydrocannabinol - the chemical in marijuana responsible for the "HIGH"). Studies suggest that CBD may interact with different biological target receptors in the body, including cannabinoid and other neurotransmitter receptors. As of 2018 the mechanism of action for its biological effects has not been determined.  Side-effects  Adverse reactions: Dry mouth, diarrhea, decreased appetite, fatigue, drowsiness, malaise, weakness, sleep disturbances, and others.  Drug interactions: CBC may interact with other  medications such as blood-thinners. (Last update: 03/20/2020) ____________________________________________________________________________________________    

## 2020-08-11 NOTE — Progress Notes (Signed)
Nursing Pain Medication Assessment:  Safety precautions to be maintained throughout the outpatient stay will include: orient to surroundings, keep bed in low position, maintain call bell within reach at all times, provide assistance with transfer out of bed and ambulation.  Medication Inspection Compliance: Pill count conducted under aseptic conditions, in front of the patient. Neither the pills nor the bottle was removed from the patient's sight at any time. Once count was completed pills were immediately returned to the patient in their original bottle.  Medication: Oxycodone IR Pill/Patch Count: 3 of 90 pills remain Pill/Patch Appearance: Markings consistent with prescribed medication Bottle Appearance: Standard pharmacy container. Clearly labeled. Filled Date: 94 / 01/ 2021 Last Medication intake:  Yesterday

## 2020-08-23 ENCOUNTER — Telehealth (INDEPENDENT_AMBULATORY_CARE_PROVIDER_SITE_OTHER): Payer: Medicare HMO | Admitting: Internal Medicine

## 2020-08-23 ENCOUNTER — Encounter: Payer: Self-pay | Admitting: Internal Medicine

## 2020-08-23 VITALS — Ht 63.0 in | Wt 243.0 lb

## 2020-08-23 DIAGNOSIS — M791 Myalgia, unspecified site: Secondary | ICD-10-CM | POA: Diagnosis not present

## 2020-08-23 DIAGNOSIS — E785 Hyperlipidemia, unspecified: Secondary | ICD-10-CM | POA: Diagnosis not present

## 2020-08-23 DIAGNOSIS — I10 Essential (primary) hypertension: Secondary | ICD-10-CM

## 2020-08-23 DIAGNOSIS — I739 Peripheral vascular disease, unspecified: Secondary | ICD-10-CM | POA: Diagnosis not present

## 2020-08-23 DIAGNOSIS — Z8249 Family history of ischemic heart disease and other diseases of the circulatory system: Secondary | ICD-10-CM

## 2020-08-23 DIAGNOSIS — E119 Type 2 diabetes mellitus without complications: Secondary | ICD-10-CM

## 2020-08-23 DIAGNOSIS — T466X5A Adverse effect of antihyperlipidemic and antiarteriosclerotic drugs, initial encounter: Secondary | ICD-10-CM

## 2020-08-23 MED ORDER — NEXLETOL 180 MG PO TABS: 180.0000 mg | ORAL_TABLET | Freq: Every day | ORAL | 3 refills | Status: DC

## 2020-08-23 NOTE — Progress Notes (Signed)
Virtual Visit via Telephone Note   This visit type was conducted due to national recommendations for restrictions regarding the COVID-19 Pandemic (e.g. social distancing) in an effort to limit this patient's exposure and mitigate transmission in our community.  Due to her co-morbid illnesses, this patient is at least at moderate risk for complications without adequate follow up.  This format is felt to be most appropriate for this patient at this time.  The patient did not have access to video technology/had technical difficulties with video requiring transitioning to audio format only (telephone).  All issues noted in this document were discussed and addressed.  No physical exam could be performed with this format.  Please refer to the patient's chart for her  consent to telehealth for [facility].   Date:  08/23/2020   ID:  Teresa Mcgee, DOB [birth year], MRN [id number] The patient was identified using 2 identifiers.  Evaluation Performed:  New Patient Evaluation  Patient Location:  [place] [id number]  Provider location:   [address] [place] [address], [place] [address]  PCP:  Elby Beck, FNP (Inactive)  Cardiologist:  No primary care provider on file. Electrophysiologist:  None   Chief Complaint:  Manage dyslipidemia  History of Present Illness:    Teresa Mcgee is a 55 y.o. female who presents via audio/video conferencing for a telehealth visit today.  This is a pleasant 55 year old female with type 2 diabetes, dyslipidemia, hypertension and history of statin intolerance to both atorvastatin and simvastatin, who presents for evaluation and management of dyslipidemia.  She recently had a lipid profile which demonstrated a total cholesterol of 204, triglycerides 117, HDL 46 and LDL 134.  Hemoglobin A1c is 5.8.  She has no known history of heart disease although she had a brother and father both who had heart disease in their 21s.  A CT  scan of the abdomen and pelvis was done along with a CT angio of the chest in October 2019 for shortness of breath and upper abdominal pain, this did not suggest any coronary artery calcification however there is evidence for "peripheral calcified atherosclerotic plaque".  Based on this, she does need aggressive lipid-lowering and would target an LDL to less than 70.  Unfortunately she has not been able to tolerate statins having myalgias.  She is hesitant to try another statin at this point.  She was placed on ezetimibe however her cholesterol remains above target.  We discussed options today.  The patient does not have symptoms concerning for COVID-19 infection (fever, chills, cough, or new SHORTNESS OF BREATH).    Prior CV studies:   The following studies were reviewed today:  Chart reviewed, lab work reviewed  PMHx:  Past Medical History:  Diagnosis Date  . Arthritis   . Bell's palsy may 2012  . Chest pain   . Diabetes mellitus without complication ([place])   . GERD (gastroesophageal reflux disease)   . H/O hiatal hernia   . Hyperlipidemia   . Hypertension   . Hypothyroidism   . Shingles   . Shortness of breath   . Sleep apnea    uses cpap    Past Surgical History:  Procedure Laterality Date  . ABDOMINAL HYSTERECTOMY    . CESAREAN SECTION    . cesarian     3x  . COLONOSCOPY WITH PROPOFOL N/A 11/27/2019   Procedure: COLONOSCOPY WITH PROPOFOL;  Surgeon: Jonathon Bellows, MD;  Location: [facility] ENDOSCOPY;  Service: Gastroenterology;  Laterality: N/A;  .  ESOPHAGOGASTRODUODENOSCOPY (EGD) WITH PROPOFOL N/A 07/20/2020   Procedure: ESOPHAGOGASTRODUODENOSCOPY (EGD) WITH PROPOFOL;  Surgeon: Jonathon Bellows, MD;  Location[facility] ENDOSCOPY;  Service: Gastroenterology;  Laterality: N/A;  . PARTIAL HYSTERECTOMY  2000   abnormal uterine bleeding  . THYROIDECTOMY  1998  . TOOTH EXTRACTION    . TUBAL LIGATION      FAMHx:  Family History  Problem Relation Age of Onset  . Heart attack Father 29        deceased  . Diabetes Brother   . Stroke Brother 25       twice  . Stroke Sister 54  . Diabetes Sister   . Heart attack Sister 28  . Heart attack Brother 52  . Breast cancer Maternal Aunt     SOCHx:   reports that she has been smoking cigarettes. She has a 10.00 pack-year smoking history. She has never used smokeless tobacco. She reports that she does not drink alcohol and does not use drugs.  ALLERGIES:  No Known Allergies  MEDS:  Current Meds  Medication Sig  . Accu-Chek FastClix Lancets MISC USE UP TO 4 TIMES DAILY AS DIRECTED  . ACCU-CHEK GUIDE test strip USE UP TO 4 TIMES DAILY AS DIRECTED  . albuterol (VENTOLIN HFA) 108 (90 Base) MCG/ACT inhaler INHALE 2 PUFFS INTO THE LUNGS EVERY 4 HOURS AS NEEDED FOR (COUGH, SHORTNESS OF BREATH OR WHEEZING.).  Marland Kitchen amLODipine (NORVASC) 10 MG tablet TAKE 1 TABLET BY MOUTH EVERY DAY  . azelastine (OPTIVAR) 0.05 % ophthalmic solution Place 1 drop into both eyes 2 (two) times daily as needed.  . blood glucose meter kit and supplies Dispense based on patient and insurance preference. Use up to four times daily as directed. (FOR ICD-10 E10.9, E11.9).  . CVS D3 125 MCG (5000 UT) capsule TAKE 1 CAPSULE BY MOUTH DAILY WITH BREAKFAST. TAKE ALONG WITH CALCIUM AND MAGNESIUM.  Marland Kitchen ezetimibe (ZETIA) 10 MG tablet TAKE 1 TABLET BY MOUTH EVERY DAY  . fluticasone (FLONASE) 50 MCG/ACT nasal spray SPRAY 2 SPRAYS INTO EACH NOSTRIL EVERY DAY  . gabapentin (NEURONTIN) 100 MG capsule Take 1 capsule (100 mg total) by mouth 2 (two) times daily AND 3 capsules (300 mg total) at bedtime.  . hydrochlorothiazide (HYDRODIURIL) 25 MG tablet TAKE 1 TABLET (25 MG TOTAL) BY MOUTH DAILY. AS DIRECTED  . levocetirizine (XYZAL) 5 MG tablet TAKE 1 TABLET BY MOUTH EVERY DAY IN THE EVENING  . levothyroxine (SYNTHROID) 125 MCG tablet Take 1 tablet (125 mcg total) by mouth daily.  . Magnesium 500 MG CAPS Take 1 capsule (500 mg total) by mouth 2 (two) times daily at 8 am and 10 pm.  .  meloxicam (MOBIC) 15 MG tablet Take 1 tablet (15 mg total) by mouth daily.  . metFORMIN (GLUCOPHAGE) 500 MG tablet TAKE 1 TABLET (500 MG TOTAL) BY MOUTH 2 (TWO) TIMES DAILY WITH A MEAL.  Marland Kitchen Olopatadine HCl 0.2 % SOLN INSTILL 1 DROP INTO BOTH EYES EVERY DAY AS NEEDED  . omeprazole (PRILOSEC) 40 MG capsule Take 1 capsule (40 mg total) by mouth daily.  Marland Kitchen oxyCODONE (OXY IR/ROXICODONE) 5 MG immediate release tablet Take 1 tablet (5 mg total) by mouth every 8 (eight) hours as needed for severe pain. Must last 30 days  . REFRESH CELLUVISC 1 % GEL APPLY 1 DROP TO EYE 3 (THREE) TIMES DAILY.  Marland Kitchen tiZANidine (ZANAFLEX) 4 MG tablet Take 1 tablet (4 mg total) by mouth every 8 (eight) hours as needed for muscle spasms.  ROS: Pertinent items noted in HPI and remainder of comprehensive ROS otherwise negative.  Labs/Other Tests and Data Reviewed:    Recent Labs: 04/16/2020: ALT 66; BUN 8; Creatinine, Ser 0.88; Hemoglobin 14.2; Platelets 182.0; Potassium 3.6; Sodium 141; TSH 1.59   Recent Lipid Panel Lab Results  Component Value Date/Time   CHOL 204 (H) 04/16/2020 12:35 PM   TRIG 117.0 04/16/2020 12:35 PM   HDL 46.50 04/16/2020 12:35 PM   CHOLHDL 4 04/16/2020 12:35 PM   LDLCALC 134 (H) 04/16/2020 12:35 PM   LDLCALC 159 (H) 11/16/2017 03:29 PM    Wt Readings from Last 3 Encounters:  08/23/20 243 lb (110.2 kg)  08/11/20 243 lb (110.2 kg)  07/20/20 246 lb (111.6 kg)     Exam:    Vital Signs:  Ht 5' 3" (1.6 m)   Wt 243 lb (110.2 kg)   LMP  (LMP Unknown)   BMI 43.05 kg/m    Exam not performed due to telephone visit  ASSESSMENT & PLAN:    1. Mixed dyslipidemia, goal LDL less than 70 2. Type 2 diabetes 3. Hypertension 4. Family history of premature coronary disease 5. PAD with calcified peripheral atherosclerosis 6. Statin intolerant-myalgias  Ms. Carrell has a mixed dyslipidemia and has been statin intolerant.  She had previously tried atorvastatin and simvastatin which caused significant  myalgias.  She is currently on ezetimibe but remains well above target.  We discussed options including possible PCSK9 inhibitor therapy versus oral therapy such as Nexletol.  She would prefer to try an oral therapy and I think this is a reasonable option.  We will try to get prior authorization for Nexletol 180 mg daily.  If this is approved and affordable then she will take that in addition to ezetimibe and will plan repeat lipids in about 3 to 4 months with follow-up at that time.   Thanks again for the kind referral.  COVID-19 Education: The signs and symptoms of COVID-19 were discussed with the patient and how to seek care for testing (follow up with PCP or arrange E-visit).  The importance of social distancing was discussed today.  Patient Risk:   After full review of this patients clinical status, I feel that they are at least moderate risk at this time.  Time:   Today, I have spent 25 minutes with the patient with telehealth technology discussing dyslipidemia, goal LDL cholesterol.     Medication Adjustments/Labs and Tests Ordered: Current medicines are reviewed at length with the patient today.  Concerns regarding medicines are outlined above.   Tests Ordered: Orders Placed This Encounter  Procedures  . Lipid panel    Medication Changes: No orders of the defined types were placed in this encounter.   Disposition:  in 3 month(s)  Pixie Casino, MD, [facility] Director of the Advanced Lipid Disorders &  Cardiovascular Risk Reduction Clinic Diplomate of the American Board of Clinical Lipidology Attending Cardiologist  Direct Dial: [phone]  Fax: [phone]  Website:  [email or web address]  Pixie Casino, MD  08/23/2020 1:06 PM

## 2020-08-23 NOTE — Addendum Note (Signed)
Addended by: Rexanne Mano B on: 08/23/2020 02:23 PM   Modules accepted: Orders

## 2020-08-23 NOTE — Patient Instructions (Signed)
Medication Instructions:  START: Nexletol 180 mg daily  *If you need a refill on your cardiac medications before your next appointment, please call your pharmacy*  Lab Work: REPEAT LIPIDS IN 3 MONTHS  If you have labs (blood work) drawn today and your tests are completely normal, you will receive your results only by: Marland Kitchen MyChart Message (if you have MyChart) OR . A paper copy in the mail If you have any lab test that is abnormal or we need to change your treatment, we will call you to review the results.  Follow-Up: At [facility], you and your health needs are our priority.  As part of our continuing mission to provide you with exceptional heart care, we have created designated Provider Care Teams.  These Care Teams include your primary Cardiologist (physician) and Advanced Practice Providers (APPs -  Physician Assistants and Nurse Practitioners) who all work together to provide you with the care you need, when you need it.  We recommend signing up for the patient portal called "MyChart".  Sign up information is provided on this After Visit Summary.  MyChart is used to connect with patients for Virtual Visits (Telemedicine).  Patients are able to view lab/test results, encounter notes, upcoming appointments, etc.  Non-urgent messages can be sent to your provider as well.   To learn more about what you can do with MyChart, go to [email or web address].    Your next appointment:   3 months   The format for your next appointment:   In Person  Provider:   K. Mali Hilty, MD

## 2020-08-24 ENCOUNTER — Telehealth: Payer: Self-pay | Admitting: Internal Medicine

## 2020-08-24 NOTE — Telephone Encounter (Signed)
PA for nexletol submitted via CMM (Key: [id number]) PA Case: [id number], Status: Approved, Coverage Starts on: 08/14/2020 12:00:00 AM, Coverage Ends on: 08/13/2021 12:00:00 AM

## 2020-08-24 NOTE — Telephone Encounter (Signed)
Left message for patient that medication is approved, how to apply for grant from [employer]

## 2020-08-24 NOTE — Telephone Encounter (Signed)
Patient is returning call to discuss the [employer].

## 2020-08-24 NOTE — Telephone Encounter (Signed)
Spoke with patient and discussed how to apply for [employer]

## 2020-09-09 ENCOUNTER — Telehealth: Payer: Self-pay | Admitting: Family Medicine

## 2020-09-09 NOTE — Telephone Encounter (Signed)
Called and left vm for the patient to call us. No DPR.Needs  To schedule  TOC appt per DPR. EM

## 2020-09-10 ENCOUNTER — Ambulatory Visit: Payer: Medicare HMO | Admitting: Pulmonary Disease

## 2020-09-16 ENCOUNTER — Ambulatory Visit: Payer: Medicare HMO | Admitting: Podiatry

## 2020-09-22 ENCOUNTER — Ambulatory Visit (INDEPENDENT_AMBULATORY_CARE_PROVIDER_SITE_OTHER): Payer: Medicare HMO | Admitting: Primary Care

## 2020-09-22 ENCOUNTER — Other Ambulatory Visit: Payer: Self-pay

## 2020-09-22 ENCOUNTER — Encounter: Payer: Self-pay | Admitting: Primary Care

## 2020-09-22 DIAGNOSIS — Z720 Tobacco use: Secondary | ICD-10-CM | POA: Diagnosis not present

## 2020-09-22 DIAGNOSIS — G4733 Obstructive sleep apnea (adult) (pediatric): Secondary | ICD-10-CM

## 2020-09-22 NOTE — Progress Notes (Signed)
'@Patient'  ID: Teresa Mcgee, female    DOB: 06-14-1966, 55 y.o.   MRN: [id number]  Chief Complaint  Patient presents with  . Follow-up    Wearing cpap avg 5hr nightly- feels pressure and mask is okay.     Referring provider: No ref. provider found  HPI: 55 year old female, current everyday smoker (10-pack-year history).  Past medical history significant for obstructive sleep apnea, hypertension, GERD, hypothyroidism, type 2 diabetes, chronic pain syndrome.  Former patient of Dr. Ashby Dawes, last seen in office by pulmonary nurse practitioner on 08/20/19. Maintained on auto CPAP 10-20cm h20.   Previous [facility] pulmonary encounter 08/20/2019 Patient contacted today for televisit 1 year OSA follow-up. Compliant with CPAP use. She reports no issues with pressure setting or mask fit. Feels well rested in the morning. She has not needed to use her ventolin rescue inhaler in 1 month. Actively trying to lose weight, she has lost >24lbs with the help of a nutritionist. She treated herself for christmas to a new outfit. She is still smoking 4-5 cigarettes daily.   Airview download 07/19/19- 08/16/18: 30/30 (100%); 83% >4 hours. Average use 6 hours 5 mins  Pressure 10-20 cm h20 (19- 95%). AHI 2.1   09/22/2020- Interim hx Patient presents today for 1 year follow-up OSA. She is doing well. No issues with mask or pressure setting. She states her mask at times is uncomfortable but this does not significantly bother. She wears full face mask. Gets new supplies every 3 months. She sleeps well at night. She feels rested the next morning. DME is [employer]  Airview download 08/21/20-09/19/20 Usage 30/30 days used; 97% > 4 hours Average usage 7 hours 49 mins Pressure 10-20cm h20 Airleak 14.3L/min AHI 2.5  No Known Allergies  Immunization History  Administered Date(s) Administered  . Influenza Split 05/23/2011, 04/24/2012  . Influenza,inj,Quad PF,6+ Mos 06/11/2019  . PFIZER(Purple Top)SARS-COV-2 Vaccination  11/12/2019, 12/03/2019  . Tdap 04/25/2011    Past Medical History:  Diagnosis Date  . Arthritis   . Bell's palsy may 2012  . Chest pain   . Diabetes mellitus without complication ([place])   . GERD (gastroesophageal reflux disease)   . H/O hiatal hernia   . Hyperlipidemia   . Hypertension   . Hypothyroidism   . Shingles   . Shortness of breath   . Sleep apnea    uses cpap    Tobacco History: Social History   Tobacco Use  Smoking Status Current Every Day Smoker  . Packs/day: 2.00  . Years: 20.00  . Pack years: 40.00  . Types: Cigarettes  Smokeless Tobacco Never Used  Tobacco Comment   2-3 cigs daily-09/22/2020   Ready to quit: Not Answered Counseling given: Not Answered Comment: 2-3 cigs daily-09/22/2020   Outpatient Medications Prior to Visit  Medication Sig Dispense Refill  . Accu-Chek FastClix Lancets MISC USE UP TO 4 TIMES DAILY AS DIRECTED 102 each 1  . ACCU-CHEK GUIDE test strip USE UP TO 4 TIMES DAILY AS DIRECTED 100 strip 5  . albuterol (VENTOLIN HFA) 108 (90 Base) MCG/ACT inhaler INHALE 2 PUFFS INTO THE LUNGS EVERY 4 HOURS AS NEEDED FOR (COUGH, SHORTNESS OF BREATH OR WHEEZING.). 18 g 2  . amLODipine (NORVASC) 10 MG tablet TAKE 1 TABLET BY MOUTH EVERY DAY 90 tablet 1  . azelastine (OPTIVAR) 0.05 % ophthalmic solution Place 1 drop into both eyes 2 (two) times daily as needed. 6 mL 12  . Bempedoic Acid (NEXLETOL) 180 MG TABS Take 180 mg by mouth daily.  30 tablet 3  . blood glucose meter kit and supplies Dispense based on patient and insurance preference. Use up to four times daily as directed. (FOR ICD-10 E10.9, E11.9). 1 each 0  . CVS D3 125 MCG (5000 UT) capsule TAKE 1 CAPSULE BY MOUTH DAILY WITH BREAKFAST. TAKE ALONG WITH CALCIUM AND MAGNESIUM.  5  . ezetimibe (ZETIA) 10 MG tablet TAKE 1 TABLET BY MOUTH EVERY DAY 90 tablet 1  . fluticasone (FLONASE) 50 MCG/ACT nasal spray SPRAY 2 SPRAYS INTO EACH NOSTRIL EVERY DAY 16 mL 6  . gabapentin (NEURONTIN) 100 MG capsule  Take 1 capsule (100 mg total) by mouth 2 (two) times daily AND 3 capsules (300 mg total) at bedtime. 150 capsule 2  . hydrochlorothiazide (HYDRODIURIL) 25 MG tablet TAKE 1 TABLET (25 MG TOTAL) BY MOUTH DAILY. AS DIRECTED 90 tablet 2  . levocetirizine (XYZAL) 5 MG tablet TAKE 1 TABLET BY MOUTH EVERY DAY IN THE EVENING 90 tablet 1  . levothyroxine (SYNTHROID) 125 MCG tablet Take 1 tablet (125 mcg total) by mouth daily. 90 tablet 3  . Magnesium 500 MG CAPS Take 1 capsule (500 mg total) by mouth 2 (two) times daily at 8 am and 10 pm. 180 capsule 3  . meloxicam (MOBIC) 15 MG tablet Take 1 tablet (15 mg total) by mouth daily. 30 tablet 2  . metFORMIN (GLUCOPHAGE) 500 MG tablet TAKE 1 TABLET (500 MG TOTAL) BY MOUTH 2 (TWO) TIMES DAILY WITH A MEAL. 180 tablet 3  . Olopatadine HCl 0.2 % SOLN INSTILL 1 DROP INTO BOTH EYES EVERY DAY AS NEEDED    . omeprazole (PRILOSEC) 40 MG capsule Take 1 capsule (40 mg total) by mouth daily. 90 capsule 1  . REFRESH CELLUVISC 1 % GEL APPLY 1 DROP TO EYE 3 (THREE) TIMES DAILY.  12  . tiZANidine (ZANAFLEX) 4 MG tablet Take 1 tablet (4 mg total) by mouth every 8 (eight) hours as needed for muscle spasms. 90 tablet 2  . oxyCODONE (OXY IR/ROXICODONE) 5 MG immediate release tablet Take 1 tablet (5 mg total) by mouth every 8 (eight) hours as needed for severe pain. Must last 30 days 90 tablet 0   No facility-administered medications prior to visit.    Review of Systems  Review of Systems  Constitutional: Negative.   Respiratory: Negative.  Negative for shortness of breath.   Cardiovascular: Negative.   Psychiatric/Behavioral: The patient is not nervous/anxious.    Physical Exam  BP 124/68 (BP Location: Left Arm, Cuff Size: Large)   Pulse 69   Temp (!) 97.1 F (36.2 C) (Temporal)   Ht '5\' 3"'  (1.6 m)   Wt 248 lb 3.2 oz (112.6 kg)   LMP  (LMP Unknown)   SpO2 99%   BMI 43.97 kg/m  Physical Exam Constitutional:      Appearance: Normal appearance.  Cardiovascular:      Rate and Rhythm: Normal rate and regular rhythm.  Pulmonary:     Effort: Pulmonary effort is normal.     Breath sounds: Normal breath sounds.     Comments: CTA Neurological:     Mental Status: She is alert.  Psychiatric:        Mood and Affect: Mood normal.        Behavior: Behavior normal.        Thought Content: Thought content normal.        Judgment: Judgment normal.      Lab Results:  CBC    Component Value Date/Time  WBC 6.0 04/16/2020 1235   RBC 4.71 04/16/2020 1235   HGB 14.2 04/16/2020 1235   HCT 42.3 04/16/2020 1235   PLT 182.0 04/16/2020 1235   MCV 89.9 04/16/2020 1235   MCH 30.1 05/25/2018 1143   MCHC 33.6 04/16/2020 1235   RDW 14.1 04/16/2020 1235   LYMPHSABS 3.1 04/16/2020 1235   MONOABS 0.4 04/16/2020 1235   EOSABS 0.1 04/16/2020 1235   BASOSABS 0.1 04/16/2020 1235    BMET    Component Value Date/Time   NA 141 04/16/2020 1235   NA 143 01/24/2018 1408   K 3.6 04/16/2020 1235   CL 100 04/16/2020 1235   CO2 33 (H) 04/16/2020 1235   GLUCOSE 95 04/16/2020 1235   BUN 8 04/16/2020 1235   BUN 7 01/24/2018 1408   CREATININE 0.88 04/16/2020 1235   CREATININE 0.70 11/16/2017 1529   CALCIUM 10.0 04/16/2020 1235   GFRNONAA >60 05/25/2018 1143   GFRAA >60 05/25/2018 1143    BNP No results found for: BNP  ProBNP    Component Value Date/Time   PROBNP 23.5 01/11/2012 0050    Imaging: No results found.   Assessment & Plan:   Sleep apnea - Patient is 97% compliant with CPAP > 4 hours and reports benefit from use - Pressure 10-20cm h20; residual AHI 2.5 - No changes, renew CPAP supplies with [employer] - FU in 1 year  Tobacco abuse - She has cut back significantly, currently smoking 4 cigarettes a day - Encourage she pick quit date and attempt to quit smoking    Martyn Ehrich, NP 09/22/2020

## 2020-09-22 NOTE — Patient Instructions (Signed)
Nice meeting you today  Continue to wear CPAP every night 4-6 hours or more Do not drive if experiencing excessive daytime fatigue or somnolence Renew CPAP supplies with [employer]  Follow-up 1 year with Dr. Mortimer Fries (new patient OSA)   CPAP and BPAP Information CPAP and BPAP are methods that use air pressure to keep your airways open and to help you breathe well. CPAP and BPAP use different amounts of pressure. Your health care provider will tell you whether CPAP or BPAP would be more helpful for you.  CPAP stands for "continuous positive airway pressure." With CPAP, the amount of pressure stays the same while you breathe in and out.  BPAP stands for "bi-level positive airway pressure." With BPAP, the amount of pressure will be higher when you breathe in (inhale) and lower when you breathe out(exhale). This allows you to take larger breaths. CPAP or BPAP may be used in the hospital, or your health care provider may want you to use it at home. You may need to have a sleep study before your health care provider can order a machine for you to use at home. Why are CPAP and BPAP treatments used? CPAP or BPAP can be helpful if you have:  Sleep apnea.  Chronic obstructive pulmonary disease (COPD).  Heart failure.  Medical conditions that cause muscle weakness, including muscular dystrophy or amyotrophic lateral sclerosis (ALS).  Other problems that cause breathing to be shallow, weak, abnormal, or difficult. CPAP and BPAP are most commonly used for obstructive sleep apnea (OSA) to keep the airways from collapsing when the muscles relax during sleep. How is CPAP or BPAP administered? Both CPAP and BPAP are provided by a small machine with a flexible plastic tube that attaches to a plastic mask that you wear. Air is blown through the mask into your nose or mouth. The amount of pressure that is used to blow the air can be adjusted on the machine. Your health care provider will set the pressure setting  and help you find the best mask for you. When should CPAP or BPAP be used? In most cases, the mask only needs to be worn during sleep. Generally, the mask needs to be worn throughout the night and during any daytime naps. People with certain medical conditions may also need to wear the mask at other times when they are awake. Follow instructions from your health care provider about when to use the machine. What are some tips for using the mask?  Because the mask needs to be snug, some people feel trapped or closed-in (claustrophobic) when first using the mask. If you feel this way, you may need to get used to the mask. One way to do this is to hold the mask loosely over your nose or mouth and then gradually apply the mask more snugly. You can also gradually increase the amount of time that you use the mask.  Masks are available in various types and sizes. If your mask does not fit well, talk with your health care provider about getting a different one. Some common types of masks include: ? Full face masks, which fit over the mouth and nose. ? Nasal masks, which fit over the nose. ? Nasal pillow or prong masks, which fit into the nostrils.  If you are using a mask that fits over your nose and you tend to breathe through your mouth, a chin strap may be applied to help keep your mouth closed.  Some CPAP and BPAP machines have alarms  that may sound if the mask comes off or develops a leak.  If you have trouble with the mask, it is very important that you talk with your health care provider about finding a way to make the mask easier to tolerate. Do not stop using the mask. There could be a negative impact to your health if you stop using the mask.   What are some tips for using the machine?  Place your CPAP or BPAP machine on a secure table or stand near an electrical outlet.  Know where the on/off switch is on the machine.  Follow instructions from your health care provider about how to set the  pressure on your machine and when you should use it.  Do not eat or drink while the CPAP or BPAP machine is on. Food or fluids could get pushed into your lungs by the pressure of the CPAP or BPAP.  For home use, CPAP and BPAP machines can be rented or purchased through home health care companies. Many different brands of machines are available. Renting a machine before purchasing may help you find out which particular machine works well for you. Your insurance may also decide which machine you may get.  Keep the CPAP or BPAP machine and attachments clean. Ask your health care provider for specific instructions. Follow these instructions at home:  Do not use any products that contain nicotine or tobacco, such as cigarettes, e-cigarettes, and chewing tobacco. If you need help quitting, ask your health care provider.  Keep all follow-up visits as told by your health care provider. This is important. Contact a health care provider if:  You have redness or pressure sores on your head, face, mouth, or nose from the mask or head gear.  You have trouble using the CPAP or BPAP machine.  You cannot tolerate wearing the CPAP or BPAP mask.  Someone tells you that you snore even when wearing your CPAP or BPAP. Get help right away if:  You have trouble breathing.  You feel confused. Summary  CPAP and BPAP are methods that use air pressure to keep your airways open and to help you breathe well.  You may need to have a sleep study before your health care provider can order a machine for home use.  If you have trouble with the mask, it is very important that you talk with your health care provider about finding a way to make the mask easier to tolerate. Do not stop using the mask. There could be a negative impact to your health if you stop using the mask.  Follow instructions from your health care provider about when to use the machine. This information is not intended to replace advice given to  you by your health care provider. Make sure you discuss any questions you have with your health care provider. Document Revised: 08/22/2019 Document Reviewed: 08/25/2019 Elsevier Patient Education  2021 [employer].

## 2020-09-22 NOTE — Assessment & Plan Note (Addendum)
-   Patient is 97% compliant with CPAP > 4 hours and reports benefit from use - Pressure 10-20cm h20; residual AHI 2.5 - No changes, renew CPAP supplies with [employer] - FU in 1 year

## 2020-09-22 NOTE — Assessment & Plan Note (Addendum)
-   She has cut back significantly, currently smoking 4 cigarettes a day - Encourage she pick quit date and attempt to quit smoking

## 2020-09-23 ENCOUNTER — Ambulatory Visit (INDEPENDENT_AMBULATORY_CARE_PROVIDER_SITE_OTHER): Payer: Medicare HMO | Admitting: Family Medicine

## 2020-09-23 ENCOUNTER — Encounter: Payer: Self-pay | Admitting: Family Medicine

## 2020-09-23 ENCOUNTER — Other Ambulatory Visit: Payer: Self-pay

## 2020-09-23 VITALS — BP 122/78 | HR 66 | Temp 95.7°F | Ht 63.0 in | Wt 246.3 lb

## 2020-09-23 DIAGNOSIS — L608 Other nail disorders: Secondary | ICD-10-CM | POA: Insufficient documentation

## 2020-09-23 DIAGNOSIS — E039 Hypothyroidism, unspecified: Secondary | ICD-10-CM

## 2020-09-23 DIAGNOSIS — E119 Type 2 diabetes mellitus without complications: Secondary | ICD-10-CM

## 2020-09-23 DIAGNOSIS — G8929 Other chronic pain: Secondary | ICD-10-CM

## 2020-09-23 DIAGNOSIS — M8949 Other hypertrophic osteoarthropathy, multiple sites: Secondary | ICD-10-CM | POA: Diagnosis not present

## 2020-09-23 DIAGNOSIS — M159 Polyosteoarthritis, unspecified: Secondary | ICD-10-CM

## 2020-09-23 DIAGNOSIS — M5137 Other intervertebral disc degeneration, lumbosacral region: Secondary | ICD-10-CM

## 2020-09-23 DIAGNOSIS — M5442 Lumbago with sciatica, left side: Secondary | ICD-10-CM

## 2020-09-23 DIAGNOSIS — E7800 Pure hypercholesterolemia, unspecified: Secondary | ICD-10-CM | POA: Diagnosis not present

## 2020-09-23 DIAGNOSIS — M5441 Lumbago with sciatica, right side: Secondary | ICD-10-CM

## 2020-09-23 MED ORDER — MELOXICAM 15 MG PO TABS: 15.0000 mg | ORAL_TABLET | Freq: Every day | ORAL | 2 refills | Status: DC | PRN

## 2020-09-23 NOTE — Progress Notes (Signed)
Subjective:    Patient ID: Teresa Mcgee, female    DOB: [birth year], 55 y.o.   MRN: [id number]  This visit occurred during the SARS-CoV-2 public health emergency.  Safety protocols were in place, including screening questions prior to the visit, additional usage of staff PPE, and extensive cleaning of exam room while observing appropriate contact time as indicated for disinfecting solutions.    HPI 55 yo pt of NP Carlean Purl presents with a hand problem  Wt Readings from Last 3 Encounters:  09/23/20 246 lb 5 oz (111.7 kg)  09/22/20 248 lb 3.2 oz (112.6 kg)  08/23/20 243 lb (110.2 kg)   43.63 kg/m    Noted cuticle areas are dry and cracked  Worse in the winter time  Does not bite nails  She clips her own nails  No hx of infection  She used moisturizer - gold bond , and jergins   She washes dishes a lot   Has hypothyroidism  Lab Results  Component Value Date   TSH 1.59 04/16/2020      Cholesterol Lab Results  Component Value Date   CHOL 204 (H) 04/16/2020   HDL 46.50 04/16/2020   LDLCALC 134 (H) 04/16/2020   TRIG 117.0 04/16/2020   CHOLHDL 4 04/16/2020  Debbie sent her to cholesterol doctor  Taking zetia and bempedoic acid 180 mg daily  She has cardiology appt with Dr Debara Pickett on 4/8   DM2 Highest sugar has been 120   Lab Results  Component Value Date   HGBA1C 5.8 (A) 04/16/2020   Very well controlled  Eats pretty healthy  Not an over eater  Taking metformin   covid immunized   Moving some px to [place]  Pain managemenet - oxycodone for chronic back pain /bulging disc Needs meloxicam  Not quite due for tizanidine and gabapentin yet  Rev note from the pain clinic Dr Lowella Dandy   Lab Results  Component Value Date   CREATININE 0.88 04/16/2020   BUN 8 04/16/2020   NA 141 04/16/2020   K 3.6 04/16/2020   CL 100 04/16/2020   CO2 33 (H) 04/16/2020   Patient Active Problem List   Diagnosis Date Noted  . Ragged cuticle 09/23/2020  . Type 2 diabetes mellitus  without complication, without long-term current use of insulin ([place]) 03/30/2019  . Pain due to onychomycosis of toenails of both feet 03/03/2019  . Morbid obesity with BMI of 45.0-49.9, adult ([place]) 09/16/2018  . Cervicalgia 04/22/2018  . Chronic musculoskeletal pain 04/22/2018  . DDD (degenerative disc disease), thoracic 04/22/2018  . DDD (degenerative disc disease), cervical 04/22/2018  . Lumbar facet arthropathy (Bilateral) 04/22/2018  . Lumbar facet syndrome (Bilateral) 04/22/2018  . Chronic low back pain (1ry area of Pain) (Bilateral) (L>R) w/o sciatica 04/22/2018  . Spondylosis without myelopathy or radiculopathy, cervical region 04/22/2018  . Spondylosis without myelopathy or radiculopathy, lumbosacral region 04/22/2018  . DDD (degenerative disc disease), lumbosacral 04/22/2018  . Strain of lumbar paraspinal muscle, sequela 04/22/2018  . Chronic hip pain (Bilateral) (R>L) 04/22/2018  . Chronic sacroiliac joint pain (Left) 04/22/2018  . Neurogenic pain 04/22/2018  . Osteoarthritis involving multiple joints 04/22/2018  . Chronic knee pain (Bilateral) (R>L) 04/22/2018  . Chronic upper extremity pain (4th area of Pain) (Bilateral) (L>R) 02/27/2018  . Vitamin D deficiency 02/27/2018  . Chronic low back pain (Bilateral) (L>R) w/ sciatica (Bilateral) 01/24/2018  . Chronic lower extremity pain (2ry area of Pain) (Bilateral) (L>R) 01/24/2018  . Chronic neck pain (3ry area of  Pain) (Bilateral) (L>R) 01/24/2018  . Chronic pain syndrome 01/24/2018  . Long term current use of opiate analgesic 01/24/2018  . Pharmacologic therapy 01/24/2018  . Disorder of skeletal system 01/24/2018  . Problems influencing health status 01/24/2018  . Osteopathic Exam Findings (Somatic Dysfunction) 04/25/2012  . Impaired glucose tolerance 02/14/2012  . Constipation 01/11/2012  . Chest pain 01/11/2012  . 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 Numbness and tingling in both hands 08/25/2011  . Nasal septal perforation  05/23/2011  . Muscle spasms of neck 05/23/2011  . Nasal mucositis (ulcerative) 05/23/2011  . Fatigue 03/21/2011  . Tobacco abuse 05/24/2010  . Hypothyroidism 01/25/2010  . HYPERTENSION, BENIGN ESSENTIAL 01/25/2010  . HYPERCHOLESTEROLEMIA 12/31/2009  . DEPRESSION 12/31/2009  . GERD 12/31/2009  . Sleep apnea 12/31/2009   Past Medical History:  Diagnosis Date  . Arthritis   . Bell's palsy may 2012  . Chest pain   . Diabetes mellitus without complication ([place])   . GERD (gastroesophageal reflux disease)   . H/O hiatal hernia   . Hyperlipidemia   . Hypertension   . Hypothyroidism   . Shingles   . Shortness of breath   . Sleep apnea    uses cpap   Past Surgical History:  Procedure Laterality Date  . ABDOMINAL HYSTERECTOMY    . CESAREAN SECTION    . cesarian     3x  . COLONOSCOPY WITH PROPOFOL N/A 11/27/2019   Procedure: COLONOSCOPY WITH PROPOFOL;  Surgeon: Jonathon Bellows, MD;  Location: [facility] ENDOSCOPY;  Service: Gastroenterology;  Laterality: N/A;  . ESOPHAGOGASTRODUODENOSCOPY (EGD) WITH PROPOFOL N/A 07/20/2020   Procedure: ESOPHAGOGASTRODUODENOSCOPY (EGD) WITH PROPOFOL;  Surgeon: Jonathon Bellows, MD;  Location: [facility] ENDOSCOPY;  Service: Gastroenterology;  Laterality: N/A;  . PARTIAL HYSTERECTOMY  2000   abnormal uterine bleeding  . THYROIDECTOMY  1998  . TOOTH EXTRACTION    . TUBAL LIGATION     Social History   Tobacco Use  . Smoking status: Current Every Day Smoker    Packs/day: 2.00    Years: 20.00    Pack years: 40.00    Types: Cigarettes  . Smokeless tobacco: Never Used  . Tobacco comment: 2-3 cigs daily-09/22/2020  Vaping Use  . Vaping Use: Never used  Substance Use Topics  . Alcohol use: No  . Drug use: No   Family History  Problem Relation Age of Onset  . Heart attack Father 34       deceased  . Diabetes Brother   . Stroke Brother 25       twice  . Stroke Sister 22  . Diabetes Sister   . Heart attack Sister 61  . Heart attack Brother 27  . Breast cancer  Maternal Aunt    No Known Allergies Current Outpatient Medications on File Prior to Visit  Medication Sig Dispense Refill  . Accu-Chek FastClix Lancets MISC USE UP TO 4 TIMES DAILY AS DIRECTED 102 each 1  . ACCU-CHEK GUIDE test strip USE UP TO 4 TIMES DAILY AS DIRECTED 100 strip 5  . albuterol (VENTOLIN HFA) 108 (90 Base) MCG/ACT inhaler INHALE 2 PUFFS INTO THE LUNGS EVERY 4 HOURS AS NEEDED FOR (COUGH, SHORTNESS OF BREATH OR WHEEZING.). 18 g 2  . amLODipine (NORVASC) 10 MG tablet TAKE 1 TABLET BY MOUTH EVERY DAY 90 tablet 1  . azelastine (OPTIVAR) 0.05 % ophthalmic solution Place 1 drop into both eyes 2 (two) times daily as needed. 6 mL 12  . Bempedoic Acid (NEXLETOL) 180 MG TABS Take 180 mg by  mouth daily. 30 tablet 3  . blood glucose meter kit and supplies Dispense based on patient and insurance preference. Use up to four times daily as directed. (FOR ICD-10 E10.9, E11.9). 1 each 0  . [employer] D3 125 MCG (5000 UT) capsule TAKE 1 CAPSULE BY MOUTH DAILY WITH BREAKFAST. TAKE ALONG WITH CALCIUM AND MAGNESIUM.  5  . ezetimibe (ZETIA) 10 MG tablet TAKE 1 TABLET BY MOUTH EVERY DAY 90 tablet 1  . fluticasone (FLONASE) 50 MCG/ACT nasal spray SPRAY 2 SPRAYS INTO EACH NOSTRIL EVERY DAY 16 mL 6  . gabapentin (NEURONTIN) 100 MG capsule Take 1 capsule (100 mg total) by mouth 2 (two) times daily AND 3 capsules (300 mg total) at bedtime. 150 capsule 2  . hydrochlorothiazide (HYDRODIURIL) 25 MG tablet TAKE 1 TABLET (25 MG TOTAL) BY MOUTH DAILY. AS DIRECTED 90 tablet 2  . levocetirizine (XYZAL) 5 MG tablet TAKE 1 TABLET BY MOUTH EVERY DAY IN THE EVENING 90 tablet 1  . levothyroxine (SYNTHROID) 125 MCG tablet Take 1 tablet (125 mcg total) by mouth daily. 90 tablet 3  . Magnesium 500 MG CAPS Take 1 capsule (500 mg total) by mouth 2 (two) times daily at 8 am and 10 pm. 180 capsule 3  . metFORMIN (GLUCOPHAGE) 500 MG tablet TAKE 1 TABLET (500 MG TOTAL) BY MOUTH 2 (TWO) TIMES DAILY WITH A MEAL. 180 tablet 3  . Olopatadine  HCl 0.2 % SOLN INSTILL 1 DROP INTO BOTH EYES EVERY DAY AS NEEDED    . omeprazole (PRILOSEC) 40 MG capsule Take 1 capsule (40 mg total) by mouth daily. 90 capsule 1  . REFRESH CELLUVISC 1 % GEL APPLY 1 DROP TO EYE 3 (THREE) TIMES DAILY.  12  . tiZANidine (ZANAFLEX) 4 MG tablet Take 1 tablet (4 mg total) by mouth every 8 (eight) hours as needed for muscle spasms. 90 tablet 2  . oxyCODONE (OXY IR/ROXICODONE) 5 MG immediate release tablet Take 1 tablet (5 mg total) by mouth every 8 (eight) hours as needed for severe pain. Must last 30 days 90 tablet 0   No current facility-administered medications on file prior to visit.    Review of Systems  Constitutional: Negative for activity change, appetite change, fatigue, fever and unexpected weight change.  HENT: Negative for congestion, ear pain, rhinorrhea, sinus pressure and sore throat.   Eyes: Negative for pain, redness and visual disturbance.  Respiratory: Negative for cough, shortness of breath and wheezing.   Cardiovascular: Negative for chest pain and palpitations.  Gastrointestinal: Negative for abdominal pain, blood in stool, constipation and diarrhea.  Endocrine: Negative for polydipsia and polyuria.  Genitourinary: Negative for dysuria, frequency and urgency.  Musculoskeletal: Negative for arthralgias, back pain and myalgias.  Skin: Negative for color change, pallor and rash.       Ragged cuticles-with pain  Allergic/Immunologic: Negative for environmental allergies.  Neurological: Negative for dizziness, syncope and headaches.  Hematological: Negative for adenopathy. Does not bruise/bleed easily.  Psychiatric/Behavioral: Negative for decreased concentration and dysphoric mood. The patient is not nervous/anxious.        Objective:   Physical Exam Constitutional:      General: She is not in acute distress.    Appearance: Normal appearance. She is well-developed and well-nourished. She is obese.  HENT:     Head: Normocephalic and  atraumatic.     Mouth/Throat:     Mouth: Oropharynx is clear and moist.  Eyes:     Extraocular Movements: EOM normal.     Conjunctiva/sclera:  Conjunctivae normal.     Pupils: Pupils are equal, round, and reactive to light.  Neck:     Thyroid: No thyromegaly.     Vascular: No carotid bruit or JVD.  Cardiovascular:     Rate and Rhythm: Normal rate and regular rhythm.     Pulses: Intact distal pulses.     Heart sounds: Normal heart sounds. No gallop.   Pulmonary:     Effort: Pulmonary effort is normal. No respiratory distress.     Breath sounds: Normal breath sounds. No wheezing or rales.     Comments: No crackles Abdominal:     General: Bowel sounds are normal. There is no distension or abdominal bruit.     Palpations: Abdomen is soft. There is no mass.     Tenderness: There is no abdominal tenderness.  Musculoskeletal:        General: No edema.     Cervical back: Normal range of motion and neck supple.     Right lower leg: No edema.     Left lower leg: No edema.  Lymphadenopathy:     Cervical: No cervical adenopathy.  Skin:    General: Skin is warm and dry.     Findings: No rash.     Comments: Cuticles are ragged on both hands  R 4th finger- some area of pink scab medial to nail   Nails appear normal  Neurological:     Mental Status: She is alert.     Cranial Nerves: No cranial nerve deficit.     Sensory: No sensory deficit.     Deep Tendon Reflexes: Reflexes are normal and symmetric.  Psychiatric:        Mood and Affect: Mood and affect and mood normal.           Assessment & Plan:   Problem List Items Addressed This Visit      Endocrine   Hypothyroidism    Lab Results  Component Value Date   TSH 1.59 04/16/2020   Some dry skin  Plans to continue levothyroxine 125 mcg daily       Type 2 diabetes mellitus without complication, without long-term current use of insulin ([place])    Per pt much improved with diet and metformin  Lab Results  Component Value  Date   HGBA1C 5.8 (A) 04/16/2020   F/u due in April         Nervous and Auditory   Chronic low back pain (Bilateral) (L>R) w/ sciatica (Bilateral) (Chronic)    Pt goes to pain management clinic Dr Liane Comber oxycodone for chronic back pain with deg changes  Plans to move her px for gabapentin and meloxicam and tizanidine to [place]  Pt states pain is stable       Relevant Medications   meloxicam (MOBIC) 15 MG tablet     Musculoskeletal and Integument   DDD (degenerative disc disease), lumbosacral (Chronic)    meloxicam filled today  Continues pain clinic for oxydocone      Relevant Medications   meloxicam (MOBIC) 15 MG tablet   Osteoarthritis involving multiple joints (Chronic)   Relevant Medications   meloxicam (MOBIC) 15 MG tablet   Ragged cuticle - Primary    Both hands- worst on R 4th finger No signs of infection  Adv not to pick or bite cuticles Plan to use moisturizers on skin and oil on nails/cutcle frequently  Use gloves for water work/dishes  Stop use of cuticle remover  Update if not starting  to improve in a week or if worsening          Other   HYPERCHOLESTEROLEMIA    Has been to lipid specialist who added bempedoic  She has f/u there in April  Continues zetia as well  Diet is improved Disc goals for lipids and reasons to control them Rev last labs with pt Rev low sat fat diet in detail Has cardiology appt in the spring

## 2020-09-23 NOTE — Assessment & Plan Note (Signed)
Per pt much improved with diet and metformin  Lab Results  Component Value Date   HGBA1C 5.8 (A) 04/16/2020   F/u due in April

## 2020-09-23 NOTE — Assessment & Plan Note (Signed)
meloxicam filled today  Continues pain clinic for oxydocone

## 2020-09-23 NOTE — Patient Instructions (Addendum)
For hands  Avoid harsh chemicals and hot water Use dish washing gloves Instead of cuticle remover , use drop of olive oil (or any oil) into your nails and cuticles several times daily  Also keep moisturizing frequently -especially in the winter  If you have infection let us know   Make sure you take meloxicam with food

## 2020-09-23 NOTE — Assessment & Plan Note (Signed)
Has been to lipid specialist who added bempedoic  She has f/u there in April  Continues zetia as well  Diet is improved Disc goals for lipids and reasons to control them Rev last labs with pt Rev low sat fat diet in detail Has cardiology appt in the spring

## 2020-09-23 NOTE — Assessment & Plan Note (Signed)
Lab Results  Component Value Date   TSH 1.59 04/16/2020   Some dry skin  Plans to continue levothyroxine 125 mcg daily

## 2020-09-23 NOTE — Assessment & Plan Note (Signed)
Both hands- worst on R 4th finger No signs of infection  Adv not to pick or bite cuticles Plan to use moisturizers on skin and oil on nails/cutcle frequently  Use gloves for water work/dishes  Stop use of cuticle remover  Update if not starting to improve in a week or if worsening

## 2020-09-23 NOTE — Assessment & Plan Note (Signed)
Pt goes to pain management clinic Dr Liane Comber oxycodone for chronic back pain with deg changes  Plans to move her px for gabapentin and meloxicam and tizanidine to [place]  Pt states pain is stable

## 2020-10-11 ENCOUNTER — Other Ambulatory Visit: Payer: Self-pay

## 2020-10-11 ENCOUNTER — Encounter: Payer: Self-pay | Admitting: Podiatry

## 2020-10-11 ENCOUNTER — Ambulatory Visit (INDEPENDENT_AMBULATORY_CARE_PROVIDER_SITE_OTHER): Payer: Medicare HMO | Admitting: Podiatry

## 2020-10-11 DIAGNOSIS — M79675 Pain in left toe(s): Secondary | ICD-10-CM | POA: Diagnosis not present

## 2020-10-11 DIAGNOSIS — M79674 Pain in right toe(s): Secondary | ICD-10-CM

## 2020-10-11 DIAGNOSIS — E119 Type 2 diabetes mellitus without complications: Secondary | ICD-10-CM

## 2020-10-11 DIAGNOSIS — B351 Tinea unguium: Secondary | ICD-10-CM

## 2020-10-11 NOTE — Progress Notes (Signed)
This patient returns to my office for at risk foot care.  This patient requires this care by a professional since this patient will be at risk due to having diabetes.  This patient is unable to cut nails herself since the patient cannot reach her nails.These nails are painful walking and wearing shoes.  This patient presents for at risk foot care today.  General Appearance  Alert, conversant and in no acute stress.  Vascular  Dorsalis pedis   pulses are palpable  bilaterally.   Posterior tibial pulses are absent  B/L. Capillary return is within normal limits  bilaterally. Cold feet   Bilaterally.  Absent hair  B/L.  Neurologic  Senn-Weinstein monofilament wire test within normal limits  bilaterally. Muscle power within normal limits bilaterally.  Nails Thick disfigured discolored nails with subungual debris  from hallux to fifth toes bilaterally. No evidence of bacterial infection or drainage bilaterally.  Orthopedic  No limitations of motion  feet .  No crepitus or effusions noted.  No bony pathology or digital deformities noted.  Skin  normotropic skin with no porokeratosis noted bilaterally.  No signs of infections or ulcers noted.     Onychomycosis  Pain in right toes  Pain in left toes  Consent was obtained for treatment procedures.   Mechanical debridement of nails 1-5  bilaterally performed with a nail nipper.  Filed with dremel without incident.    Return office visit   3 months                   Told patient to return for periodic foot care and evaluation due to potential at risk complications.   Athaliah Baumbach DPM  

## 2020-10-15 ENCOUNTER — Ambulatory Visit: Payer: Medicare HMO | Admitting: Family Medicine

## 2020-10-21 ENCOUNTER — Other Ambulatory Visit: Payer: Self-pay | Admitting: *Deleted

## 2020-10-21 DIAGNOSIS — E785 Hyperlipidemia, unspecified: Secondary | ICD-10-CM

## 2020-11-04 DIAGNOSIS — Z79891 Long term (current) use of opiate analgesic: Secondary | ICD-10-CM | POA: Insufficient documentation

## 2020-11-04 DIAGNOSIS — F1120 Opioid dependence, uncomplicated: Secondary | ICD-10-CM | POA: Insufficient documentation

## 2020-11-04 NOTE — Progress Notes (Signed)
PROVIDER NOTE: Information contained herein reflects review and annotations entered in association with encounter. Interpretation of such information and data should be left to medically-trained personnel. Information provided to patient can be located elsewhere in the medical record under "Patient Instructions". Document created using STT-dictation technology, any transcriptional errors that may result from process are unintentional.    Patient: Teresa Mcgee  Service Category: E/M  Provider: Gaspar Cola, MD  DOB: 1966/03/03  DOS: 11/08/2020  Specialty: Interventional Pain Management  MRN: [id number]  Setting: Ambulatory outpatient  PCP: Abner Greenspan, MD  Type: Established Patient    Referring Provider: Elby Beck, FNP  Location: Office  Delivery: Face-to-face     HPI  Teresa Mcgee, a 55 y.o. year old female, is here today because of her Chronic pain syndrome [G89.4]. Teresa Mcgee primary complain today is Back Pain Last encounter: My last encounter with her was on 08/11/2020. Pertinent problems: Teresa Mcgee has Muscle spasms of neck; Numbness and tingling in both hands; Chronic low back pain (Bilateral) (L>R) w/ sciatica (Bilateral); Chronic lower extremity pain (2ry area of Pain) (Bilateral) (L>R); Chronic neck pain (3ry area of Pain) (Bilateral) (L>R); Chronic pain syndrome; Chronic upper extremity pain (4th area of Pain) (Bilateral) (L>R); Cervicalgia; Chronic musculoskeletal pain; DDD (degenerative disc disease), thoracic; DDD (degenerative disc disease), cervical; Lumbar facet arthropathy (Bilateral); Lumbar facet syndrome (Bilateral); Chronic low back pain (1ry area of Pain) (Bilateral) (L>R) w/o sciatica; Spondylosis without myelopathy or radiculopathy, cervical region; Spondylosis without myelopathy or radiculopathy, lumbosacral region; DDD (degenerative disc disease), lumbosacral; Strain of lumbar paraspinal muscle, sequela; Chronic hip pain (Bilateral) (R>L); Chronic  sacroiliac joint pain (Left); Neurogenic pain; Osteoarthritis involving multiple joints; and Chronic knee pain (Bilateral) (R>L) on their pertinent problem list. Pain Assessment: Severity of Chronic pain is reported as a 4 /10. Location: Back (leg and neck) Mid,Left,Right/pain radiaties down leg at times, feet becomes numb. Onset: More than a month ago. Quality: Numbness,Constant,Burning,Aching,Pressure. Timing: Constant. Modifying factor(s): meds, sitting, heat patches, volteran gel, bio freeze. Vitals:  height is _0  (1.6 m) and weight is 245 lb (111.1 kg). Her temperature is 96.8 F (36 C) (abnormal). Her blood pressure is 137/80 and her pulse is 81. Her oxygen saturation is 100%.   Reason for encounter: medication management.   The patient indicates doing well with the current medication regimen. No adverse reactions or side effects reported to the medications.   RTCB: 02/09/2021 Nonopioids transfer 08/11/2020: Gabapentin, meloxicam, and Zanaflex  Pharmacotherapy Assessment   Analgesic: OxycodoneIR5 mg, 1 tab PO q 8 hrs (PRN) (15 mg/day of oxycodone) MME/day:22.52m/day.   Monitoring: [place] PMP: PDMP reviewed during this encounter.       Pharmacotherapy: No side-effects or adverse reactions reported. Compliance: No problems identified. Effectiveness: Clinically acceptable.  BChauncey Fischer RN  11/08/2020 11:56 AM  Sign when Signing Visit Nursing Pain Medication Assessment:  Safety precautions to be maintained throughout the outpatient stay will include: orient to surroundings, keep bed in low position, maintain call bell within reach at all times, provide assistance with transfer out of bed and ambulation.  Medication Inspection Compliance: Pill count conducted under aseptic conditions, in front of the patient. Neither the pills nor the bottle was removed from the patient's sight at any time. Once count was completed pills were immediately returned to the patient in their original  bottle.  Medication: Oxycodone IR Pill/Patch Count: 6 of 90 pills remain Pill/Patch Appearance: Markings consistent with prescribed medication Bottle Appearance: Standard pharmacy  container. Clearly labeled. Filled Date: 3 / 1 / 22 Last Medication intake:  YesterdaySafety precautions to be maintained throughout the outpatient stay will include: orient to surroundings, keep bed in low position, maintain call bell within reach at all times, provide assistance with transfer out of bed and ambulation.     UDS:  Summary  Date Value Ref Range Status  02/11/2020 Note  Final    Comment:    ==================================================================== [employer] (MW) ==================================================================== Test                             Result       Flag       Units  Drug Present and Declared for Prescription Verification   Oxycodone                      142          EXPECTED   ng/mg creat   Oxymorphone                    603          EXPECTED   ng/mg creat   Noroxycodone                   220          EXPECTED   ng/mg creat   Noroxymorphone                 84           EXPECTED   ng/mg creat    Sources of oxycodone are scheduled prescription medications.    Oxymorphone, noroxycodone, and noroxymorphone are expected    metabolites of oxycodone. Oxymorphone is also available as a    scheduled prescription medication.  ==================================================================== Test                      Result    Flag   Units      Ref Range   Creatinine              76               mg/dL      >=20 ==================================================================== Declared Medications:  The flagging and interpretation on this report are based on the  following declared medications.  Unexpected results may arise from  inaccuracies in the declared medications.   **Note: The testing scope of this panel includes these medications:    Oxycodone (Roxicodone)   **Note: The testing scope of this panel does not include the  following reported medications:   Albuterol (Ventolin HFA)  Amlodipine (Norvasc)  Azelastine  Eye Drop  Ezetimibe (Zetia)  Fluticasone (Flonase)  Gabapentin (Neurontin)  Hydrochlorothiazide (Hydrodiuril)  Levocetirizine (Xyzal)  Levothyroxine (Synthroid)  Magnesium  Meloxicam (Mobic)  Metformin (Glucophage)  Olopatadine  Tizanidine (Zanaflex)  Vitamin D3 ==================================================================== For clinical consultation, please call [phone]. ====================================================================      ROS  Constitutional: Denies any fever or chills Gastrointestinal: No reported hemesis, hematochezia, vomiting, or acute GI distress Musculoskeletal: Denies any acute onset joint swelling, redness, loss of ROM, or weakness Neurological: No reported episodes of acute onset apraxia, aphasia, dysarthria, agnosia, amnesia, paralysis, loss of coordination, or loss of consciousness  Medication Review  Accu-Chek FastClix Lancets, Bempedoic Acid, Carboxymethylcellulose Sod PF, Cholecalciferol, Magnesium, Olopatadine HCl, albuterol, amLODipine, azelastine, blood glucose meter kit and supplies, ezetimibe, fluticasone, gabapentin, glucose  blood, hydrochlorothiazide, levocetirizine, levothyroxine, meloxicam, metFORMIN, omeprazole, oxyCODONE, and tiZANidine  History Review  Allergy: Teresa Mcgee has No Known Allergies. Drug: Teresa Mcgee  reports no history of drug use. Alcohol:  reports no history of alcohol use. Tobacco:  reports that she has been smoking cigarettes. She has a 40.00 pack-year smoking history. She has never used smokeless tobacco. Social: Teresa Mcgee  reports that she has been smoking cigarettes. She has a 40.00 pack-year smoking history. She has never used smokeless tobacco. She reports that she does not drink alcohol and does not use  drugs. Medical:  has a past medical history of Arthritis, Bell's palsy (may 2012), Chest pain, Diabetes mellitus without complication ([place]), GERD (gastroesophageal reflux disease), H/O hiatal hernia, Hyperlipidemia, Hypertension, Hypothyroidism, Shingles, Shortness of breath, and Sleep apnea. Surgical: Teresa Mcgee  has a past surgical history that includes Thyroidectomy (1998); cesarian; Partial hysterectomy (2000); Cesarean section; Tubal ligation; Tooth extraction; Abdominal hysterectomy; Colonoscopy with propofol (N/A, 11/27/2019); and Esophagogastroduodenoscopy (egd) with propofol (N/A, 07/20/2020). Family: family history includes Breast cancer in her maternal aunt; Diabetes in her brother and sister; Heart attack (age of onset: 17) in her brother; Heart attack (age of onset: 78) in her sister; Heart attack (age of onset: 30) in her father; Stroke (age of onset: 1) in her brother; Stroke (age of onset: 43) in her sister.  Laboratory Chemistry Profile   Renal Lab Results  Component Value Date   BUN 8 04/16/2020   CREATININE 0.88 04/16/2020   BCR 8 (L) 01/24/2018   GFR 81.11 04/16/2020   GFRAA >60 05/25/2018   GFRNONAA >60 05/25/2018     Hepatic Lab Results  Component Value Date   AST 64 (H) 04/16/2020   ALT 66 (H) 04/16/2020   ALBUMIN 4.5 04/16/2020   ALKPHOS 64 04/16/2020   LIPASE 25 05/25/2018     Electrolytes Lab Results  Component Value Date   NA 141 04/16/2020   K 3.6 04/16/2020   CL 100 04/16/2020   CALCIUM 10.0 04/16/2020   MG 2.0 01/24/2018     Bone Lab Results  Component Value Date   VD25OH 41.99 04/16/2020   25OHVITD1 9.3 (L) 01/24/2018   25OHVITD2 <1.0 01/24/2018   25OHVITD3 9.3 01/24/2018     Inflammation (CRP: Acute Phase) (ESR: Chronic Phase) Lab Results  Component Value Date   CRP 5.5 (H) 01/24/2018   ESRSEDRATE 40 01/24/2018       Note: Above Lab results reviewed.  Recent Imaging Review  MM 3D SCREEN BREAST BILATERAL CLINICAL DATA:   Screening.  EXAM: DIGITAL SCREENING BILATERAL MAMMOGRAM WITH TOMO AND CAD  COMPARISON:  None.  ACR Breast Density Category a: The breast tissue is almost entirely fatty.  FINDINGS: There are no findings suspicious for malignancy. Images were processed with CAD.  IMPRESSION: No mammographic evidence of malignancy. A result letter of this screening mammogram will be mailed directly to the patient.  RECOMMENDATION: Screening mammogram in one year. (Code:SM-B-01Y)  BI-RADS CATEGORY  1: Negative.  Electronically Signed   By: Audie Pinto M.D.   On: 11/10/2019 10:40 Note: Reviewed        Physical Exam  General appearance: Well nourished, well developed, and well hydrated. In no apparent acute distress Mental status: Alert, oriented x 3 (person, place, & time)       Respiratory: No evidence of acute respiratory distress Eyes: PERLA Vitals: BP 137/80   Pulse 81   Temp (!) 96.8 F (36 C)   Ht _0  (1.6 m)  Wt 245 lb (111.1 kg)   LMP  (LMP Unknown)   SpO2 100%   BMI 43.40 kg/m  BMI: Estimated body mass index is 43.4 kg/m as calculated from the following:   Height as of this encounter: _0  (1.6 m).   Weight as of this encounter: 245 lb (111.1 kg). Ideal: Ideal body weight: 52.4 kg (115 lb 8.3 oz) Adjusted ideal body weight: 75.9 kg (167 lb 5 oz)  Assessment   Status Diagnosis  Controlled Controlled Controlled 1. Chronic pain syndrome   2. Chronic low back pain (1ry area of Pain) (Bilateral) (L>R) w/o sciatica   3. Chronic lower extremity pain (2ry area of Pain) (Bilateral) (L>R)   4. Chronic neck pain (3ry area of Pain) (Bilateral) (L>R)   5. Chronic upper extremity pain (4th area of Pain) (Bilateral) (L>R)   6. Pharmacologic therapy   7. Chronic use of opiate for therapeutic purpose   8. Uncomplicated opioid dependence ([place])      Updated Problems: No problems updated.  Plan of Care  Problem-specific:  No problem-specific Assessment & Plan notes  found for this encounter.  Teresa Mcgee has a current medication list which includes the following long-term medication(s): albuterol, amlodipine, ezetimibe, fluticasone, gabapentin, hydrochlorothiazide, levocetirizine, levothyroxine, magnesium, meloxicam, metformin, omeprazole, tizanidine, [START ON 11/11/2020] oxycodone, [START ON 12/11/2020] oxycodone, and [START ON 01/10/2021] oxycodone.  Pharmacotherapy (Medications Ordered): Meds ordered this encounter  Medications  . oxyCODONE (OXY IR/ROXICODONE) 5 MG immediate release tablet    Sig: Take 1 tablet (5 mg total) by mouth every 8 (eight) hours as needed for severe pain. Must last 30 days    Dispense:  90 tablet    Refill:  0    Not a duplicate. Do NOT delete! Chronic Pain: STOP Act NOT applicable. Fill 1 day early if closed on refill date. Avoid benzodiazepines within 8 hours of opioids. Do not send refill requests.  Marland Kitchen oxyCODONE (OXY IR/ROXICODONE) 5 MG immediate release tablet    Sig: Take 1 tablet (5 mg total) by mouth every 8 (eight) hours as needed for severe pain. Must last 30 days    Dispense:  90 tablet    Refill:  0    Not a duplicate. Do NOT delete! Chronic Pain: STOP Act NOT applicable. Fill 1 day early if closed on refill date. Avoid benzodiazepines within 8 hours of opioids. Do not send refill requests.  Marland Kitchen oxyCODONE (OXY IR/ROXICODONE) 5 MG immediate release tablet    Sig: Take 1 tablet (5 mg total) by mouth every 8 (eight) hours as needed for severe pain. Must last 30 days    Dispense:  90 tablet    Refill:  0    Not a duplicate. Do NOT delete! Chronic Pain: STOP Act NOT applicable. Fill 1 day early if closed on refill date. Avoid benzodiazepines within 8 hours of opioids. Do not send refill requests.   Orders:  No orders of the defined types were placed in this encounter.  Follow-up plan:   Return in about 3 months (around 02/09/2021) for (F2F), (MM).      Interventional management options:  Considering: NOTE:  NO RFA until BMI <35  Possible bilateral lumbar facet RFA Diagnosticleft-sided sacroiliac joint block Possible left-sided sacroiliac joint RFA Diagnostic bilateral IA hip joint injection Diagnostic bilateral femoral nerve + obturator NB Possible bilateral femoral nerve + obturator nerve RFA Diagnostic left-sided LESI Diagnostic bilateral transforaminal ESI Diagnostic left-sided CESI Diagnostic bilateral cervical facet block Possible bilateral cervical facet RFA  Diagnostic bilateralIAknee joint injectionswith local anesthetic and steroid Possible series of 5 bilateral intra-articular Hyalgan knee injections Diagnostic bilateral genicular nerve blocks Possible bilateral genicular nerve RFA Diagnostic trigger point injections   Palliative PRN treatment(s): Palliativebilateral lumbar facet block      Recent Visits Date Type Provider Dept  08/11/20 Office Visit Milinda Pointer, MD [facility]  Showing recent visits within past 90 days and meeting all other requirements Today's Visits Date Type Provider Dept  11/08/20 Office Visit Milinda Pointer, MD [facility]  Showing today's visits and meeting all other requirements Future Appointments No visits were found meeting these conditions. Showing future appointments within next 90 days and meeting all other requirements  I discussed the assessment and treatment plan with the patient. The patient was provided an opportunity to ask questions and all were answered. The patient agreed with the plan and demonstrated an understanding of the instructions.  Patient advised to call back or seek an in-person evaluation if the symptoms or condition worsens.  Duration of encounter: 30 minutes.  Note by: Gaspar Cola, MD Date: 11/08/2020; Time: 12:11 PM

## 2020-11-08 ENCOUNTER — Other Ambulatory Visit: Payer: Self-pay

## 2020-11-08 ENCOUNTER — Encounter: Payer: Self-pay | Admitting: Pain Medicine

## 2020-11-08 ENCOUNTER — Ambulatory Visit: Payer: Medicare HMO | Attending: Pain Medicine | Admitting: Pain Medicine

## 2020-11-08 VITALS — BP 137/80 | HR 81 | Temp 96.8°F | Ht 63.0 in | Wt 245.0 lb

## 2020-11-08 DIAGNOSIS — Z79891 Long term (current) use of opiate analgesic: Secondary | ICD-10-CM | POA: Diagnosis present

## 2020-11-08 DIAGNOSIS — M79601 Pain in right arm: Secondary | ICD-10-CM | POA: Diagnosis present

## 2020-11-08 DIAGNOSIS — M79602 Pain in left arm: Secondary | ICD-10-CM | POA: Diagnosis present

## 2020-11-08 DIAGNOSIS — G8929 Other chronic pain: Secondary | ICD-10-CM | POA: Diagnosis present

## 2020-11-08 DIAGNOSIS — M79604 Pain in right leg: Secondary | ICD-10-CM | POA: Diagnosis not present

## 2020-11-08 DIAGNOSIS — M545 Low back pain, unspecified: Secondary | ICD-10-CM | POA: Diagnosis not present

## 2020-11-08 DIAGNOSIS — M79605 Pain in left leg: Secondary | ICD-10-CM | POA: Diagnosis present

## 2020-11-08 DIAGNOSIS — F1120 Opioid dependence, uncomplicated: Secondary | ICD-10-CM

## 2020-11-08 DIAGNOSIS — Z79899 Other long term (current) drug therapy: Secondary | ICD-10-CM | POA: Insufficient documentation

## 2020-11-08 DIAGNOSIS — M542 Cervicalgia: Secondary | ICD-10-CM

## 2020-11-08 DIAGNOSIS — G894 Chronic pain syndrome: Secondary | ICD-10-CM | POA: Diagnosis not present

## 2020-11-08 MED ORDER — OXYCODONE HCL 5 MG PO TABS: 5.0000 mg | ORAL_TABLET | Freq: Three times a day (TID) | ORAL | 0 refills | Status: DC | PRN

## 2020-11-08 NOTE — Progress Notes (Signed)
Nursing Pain Medication Assessment:  Safety precautions to be maintained throughout the outpatient stay will include: orient to surroundings, keep bed in low position, maintain call bell within reach at all times, provide assistance with transfer out of bed and ambulation.  Medication Inspection Compliance: Pill count conducted under aseptic conditions, in front of the patient. Neither the pills nor the bottle was removed from the patient's sight at any time. Once count was completed pills were immediately returned to the patient in their original bottle.  Medication: Oxycodone IR Pill/Patch Count: 6 of 90 pills remain Pill/Patch Appearance: Markings consistent with prescribed medication Bottle Appearance: Standard pharmacy container. Clearly labeled. Filled Date: 3 / 1 / 22 Last Medication intake:  YesterdaySafety precautions to be maintained throughout the outpatient stay will include: orient to surroundings, keep bed in low position, maintain call bell within reach at all times, provide assistance with transfer out of bed and ambulation.

## 2020-11-08 NOTE — Patient Instructions (Signed)
____________________________________________________________________________________________  Medication Recommendations and Reminders  Applies to: All patients receiving prescriptions (written and/or electronic).  Medication Rules & Regulations: These rules and regulations exist for your safety and that of others. They are not flexible and neither are we. Dismissing or ignoring them will be considered "non-compliance" with medication therapy, resulting in complete and irreversible termination of such therapy. (See document titled "Medication Rules" for more details.) In all conscience, because of safety reasons, we cannot continue providing a therapy where the patient does not follow instructions.  Pharmacy of record:   Definition: This is the pharmacy where your electronic prescriptions will be sent.   We do not endorse any particular pharmacy, however, we have experienced problems with [facility] not securing enough medication supply for the community.  We do not restrict you in your choice of pharmacy. However, once we write for your prescriptions, we will NOT be re-sending more prescriptions to fix restricted supply problems created by your pharmacy, or your insurance.   The pharmacy listed in the electronic medical record should be the one where you want electronic prescriptions to be sent.  If you choose to change pharmacy, simply notify our nursing staff.  Recommendations:  Keep all of your pain medications in a safe place, under lock and key, even if you live alone. We will NOT replace lost, stolen, or damaged medication.  After you fill your prescription, take 1 week's worth of pills and put them away in a safe place. You should keep a separate, properly labeled bottle for this purpose. The remainder should be kept in the original bottle. Use this as your primary supply, until it runs out. Once it's gone, then you know that you have 1 week's worth of medicine, and it is time to come  in for a prescription refill. If you do this correctly, it is unlikely that you will ever run out of medicine.  To make sure that the above recommendation works, it is very important that you make sure your medication refill appointments are scheduled at least 1 week before you run out of medicine. To do this in an effective manner, make sure that you do not leave the office without scheduling your next medication management appointment. Always ask the nursing staff to show you in your prescription , when your medication will be running out. Then arrange for the receptionist to get you a return appointment, at least 7 days before you run out of medicine. Do not wait until you have 1 or 2 pills left, to come in. This is very poor planning and does not take into consideration that we may need to cancel appointments due to bad weather, sickness, or emergencies affecting our staff.  DO NOT ACCEPT A "Partial Fill": If for any reason your pharmacy does not have enough pills/tablets to completely fill or refill your prescription, do not allow for a "partial fill". The law allows the pharmacy to complete that prescription within 72 hours, without requiring a new prescription. If they do not fill the rest of your prescription within those 72 hours, you will need a separate prescription to fill the remaining amount, which we will NOT provide. If the reason for the partial fill is your insurance, you will need to talk to the pharmacist about payment alternatives for the remaining tablets, but again, DO NOT ACCEPT A PARTIAL FILL, unless you can trust your pharmacist to obtain the remainder of the pills within 72 hours.  Prescription refills and/or changes in medication(s):     Prescription refills, and/or changes in dose or medication, will be conducted only during scheduled medication management appointments. (Applies to both, written and electronic prescriptions.)  No refills on procedure days. No medication will be  changed or started on procedure days. No changes, adjustments, and/or refills will be conducted on a procedure day. Doing so will interfere with the diagnostic portion of the procedure.  No phone refills. No medications will be "called into the pharmacy".  No Fax refills.  No weekend refills.  No Holliday refills.  No after hours refills.  Remember:  Business hours are:  Monday to Thursday 8:00 AM to 4:00 PM Provider's Schedule: Damiya Sandefur, MD - Appointments are:  Medication management: Monday and Wednesday 8:00 AM to 4:00 PM Procedure day: Tuesday and Thursday 7:30 AM to 4:00 PM Bilal Lateef, MD - Appointments are:  Medication management: Tuesday and Thursday 8:00 AM to 4:00 PM Procedure day: Monday and Wednesday 7:30 AM to 4:00 PM (Last update: 03/03/2020) ____________________________________________________________________________________________   ____________________________________________________________________________________________  CBD (cannabidiol) WARNING  Applicable to: All individuals currently taking or considering taking CBD (cannabidiol) and, more important, all patients taking opioid analgesic controlled substances (pain medication). (Example: oxycodone; oxymorphone; hydrocodone; hydromorphone; morphine; methadone; tramadol; tapentadol; fentanyl; buprenorphine; butorphanol; dextromethorphan; meperidine; codeine; etc.)  Legal status: CBD remains a Schedule I drug prohibited for any use. CBD is illegal with one exception. In the [place], CBD has a limited Food and Drug Administration (FDA) approval for the treatment of two specific types of epilepsy disorders. Only one CBD product has been approved by the FDA for this purpose: "Epidiolex". FDA is aware that some companies are marketing products containing cannabis and cannabis-derived compounds in ways that violate the Federal Food, Drug and Cosmetic Act (FD&C Act) and that may put the health and safety  of consumers at risk. The FDA, a Federal agency, has not enforced the CBD status since 2018.   Legality: Some manufacturers ship CBD products nationally, which is illegal. Often such products are sold online and are therefore available throughout the country. CBD is openly sold in head shops and health food stores in some states where such sales have not been explicitly legalized. Selling unapproved products with unsubstantiated therapeutic claims is not only a violation of the law, but also can put patients at risk, as these products have not been proven to be safe or effective. Federal illegality makes it difficult to conduct research on CBD.  Reference: "FDA Regulation of Cannabis and Cannabis-Derived Products, Including Cannabidiol (CBD)" - https://www.fda.gov/news-events/public-health-focus/fda-regulation-cannabis-and-cannabis-derived-products-including-cannabidiol-cbd  Warning: CBD is not FDA approved and has not undergo the same manufacturing controls as prescription drugs.  This means that the purity and safety of available CBD may be questionable. Most of the time, despite manufacturer's claims, it is contaminated with THC (delta-9-tetrahydrocannabinol - the chemical in marijuana responsible for the "HIGH").  When this is the case, the THC contaminant will trigger a positive urine drug screen (UDS) test for Marijuana (carboxy-THC). Because a positive UDS for any illicit substance is a violation of our medication agreement, your opioid analgesics (pain medicine) may be permanently discontinued.  MORE ABOUT CBD  General Information: CBD  is a derivative of the Marijuana (cannabis sativa) plant discovered in 1940. It is one of the 113 identified substances found in Marijuana. It accounts for up to 40% of the plant's extract. As of 2018, preliminary clinical studies on CBD included research for the treatment of anxiety, movement disorders, and pain. CBD is available and consumed in multiple forms,  including   inhalation of smoke or vapor, as an aerosol spray, and by mouth. It may be supplied as an oil containing CBD, capsules, dried cannabis, or as a liquid solution. CBD is thought not to be as psychoactive as THC (delta-9-tetrahydrocannabinol - the chemical in marijuana responsible for the "HIGH"). Studies suggest that CBD may interact with different biological target receptors in the body, including cannabinoid and other neurotransmitter receptors. As of 2018 the mechanism of action for its biological effects has not been determined.  Side-effects  Adverse reactions: Dry mouth, diarrhea, decreased appetite, fatigue, drowsiness, malaise, weakness, sleep disturbances, and others.  Drug interactions: CBC may interact with other medications such as blood-thinners. (Last update: 03/20/2020) ____________________________________________________________________________________________   ____________________________________________________________________________________________  Medication Rules  Purpose: To inform patients, and their family members, of our rules and regulations.  Applies to: All patients receiving prescriptions (written or electronic).  Pharmacy of record: Pharmacy where electronic prescriptions will be sent. If written prescriptions are taken to a different pharmacy, please inform the nursing staff. The pharmacy listed in the electronic medical record should be the one where you would like electronic prescriptions to be sent.  Electronic prescriptions: In compliance with the [place] Strengthen Opioid Misuse Prevention (STOP) Act of 2017 (Session Law 2017-74/H243), effective August 14, 2018, all controlled substances must be electronically prescribed. Calling prescriptions to the pharmacy will cease to exist.  Prescription refills: Only during scheduled appointments. Applies to all prescriptions.  NOTE: The following applies primarily to controlled substances (Opioid*  Pain Medications).   Type of encounter (visit): For patients receiving controlled substances, face-to-face visits are required. (Not an option or up to the patient.)  Patient's responsibilities: 1. Pain Pills: Bring all pain pills to every appointment (except for procedure appointments). 2. Pill Bottles: Bring pills in original pharmacy bottle. Always bring the newest bottle. Bring bottle, even if empty. 3. Medication refills: You are responsible for knowing and keeping track of what medications you take and those you need refilled. The day before your appointment: write a list of all prescriptions that need to be refilled. The day of the appointment: give the list to the admitting nurse. Prescriptions will be written only during appointments. No prescriptions will be written on procedure days. If you forget a medication: it will not be "Called in", "Faxed", or "electronically sent". You will need to get another appointment to get these prescribed. No early refills. Do not call asking to have your prescription filled early. 4. Prescription Accuracy: You are responsible for carefully inspecting your prescriptions before leaving our office. Have the discharge nurse carefully go over each prescription with you, before taking them home. Make sure that your name is accurately spelled, that your address is correct. Check the name and dose of your medication to make sure it is accurate. Check the number of pills, and the written instructions to make sure they are clear and accurate. Make sure that you are given enough medication to last until your next medication refill appointment. 5. Taking Medication: Take medication as prescribed. When it comes to controlled substances, taking less pills or less frequently than prescribed is permitted and encouraged. Never take more pills than instructed. Never take medication more frequently than prescribed.  6. Inform other Doctors: Always inform, all of your  healthcare providers, of all the medications you take. 7. Pain Medication from other Providers: You are not allowed to accept any additional pain medication from any other Doctor or Healthcare provider. There are two exceptions to this rule. (see below) In   the event that you require additional pain medication, you are responsible for notifying us, as stated below. 8. Cough Medicine: Often these contain an opioid, such as codeine or hydrocodone. Never accept or take cough medicine containing these opioids if you are already taking an opioid* medication. The combination may cause respiratory failure and death. 9. Medication Agreement: You are responsible for carefully reading and following our Medication Agreement. This must be signed before receiving any prescriptions from our practice. Safely store a copy of your signed Agreement. Violations to the Agreement will result in no further prescriptions. (Additional copies of our Medication Agreement are available upon request.) 10. Laws, Rules, & Regulations: All patients are expected to follow all Federal and State Laws, Statutes, Rules, & Regulations. Ignorance of the Laws does not constitute a valid excuse.  11. Illegal drugs and Controlled Substances: The use of illegal substances (including, but not limited to marijuana and its derivatives) and/or the illegal use of any controlled substances is strictly prohibited. Violation of this rule may result in the immediate and permanent discontinuation of any and all prescriptions being written by our practice. The use of any illegal substances is prohibited. 12. Adopted CDC guidelines & recommendations: Target dosing levels will be at or below 60 MME/day. Use of benzodiazepines** is not recommended.  Exceptions: There are only two exceptions to the rule of not receiving pain medications from other Healthcare Providers. 1. Exception #1 (Emergencies): In the event of an emergency (i.e.: accident requiring emergency  care), you are allowed to receive additional pain medication. However, you are responsible for: As soon as you are able, call our office [phone], at any time of the day or night, and leave a message stating your name, the date and nature of the emergency, and the name and dose of the medication prescribed. In the event that your call is answered by a member of our staff, make sure to document and save the date, time, and the name of the person that took your information.  2. Exception #2 (Planned Surgery): In the event that you are scheduled by another doctor or dentist to have any type of surgery or procedure, you are allowed (for a period no longer than 30 days), to receive additional pain medication, for the acute post-op pain. However, in this case, you are responsible for picking up a copy of our "Post-op Pain Management for Surgeons" handout, and giving it to your surgeon or dentist. This document is available at our office, and does not require an appointment to obtain it. Simply go to our office during business hours (Monday-Thursday from 8:00 AM to 4:00 PM) (Friday 8:00 AM to 12:00 Noon) or if you have a scheduled appointment with us, prior to your surgery, and ask for it by name. In addition, you are responsible for: calling our office [phone], at any time of the day or night, and leaving a message stating your name, name of your surgeon, type of surgery, and date of procedure or surgery. Failure to comply with your responsibilities may result in termination of therapy involving the controlled substances.  *Opioid medications include: morphine, codeine, oxycodone, oxymorphone, hydrocodone, hydromorphone, meperidine, tramadol, tapentadol, buprenorphine, fentanyl, methadone. **Benzodiazepine medications include: diazepam (Valium), alprazolam (Xanax), clonazepam (Klonopine), lorazepam (Ativan), clorazepate (Tranxene), chlordiazepoxide (Librium), estazolam (Prosom), oxazepam (Serax),  temazepam (Restoril), triazolam (Halcion) (Last updated: 07/12/2020) ____________________________________________________________________________________________    

## 2020-11-10 ENCOUNTER — Telehealth: Payer: Self-pay

## 2020-11-10 DIAGNOSIS — I10 Essential (primary) hypertension: Secondary | ICD-10-CM

## 2020-11-10 MED ORDER — HYDROCHLOROTHIAZIDE 25 MG PO TABS: 25.0000 mg | ORAL_TABLET | Freq: Every day | ORAL | 0 refills | Status: DC

## 2020-11-10 NOTE — Telephone Encounter (Signed)
Pharmacy requests refill on: Hydrochlorothiazide 25 mg   LAST REFILL: 02/09/2020 (Q-90, R-2) LAST OV: 09/23/2020 NEXT OV: Not Scheduled  PHARMACY: [facility] [place], [place]

## 2020-11-18 ENCOUNTER — Other Ambulatory Visit (INDEPENDENT_AMBULATORY_CARE_PROVIDER_SITE_OTHER): Payer: Medicare HMO

## 2020-11-18 ENCOUNTER — Other Ambulatory Visit: Payer: Self-pay

## 2020-11-18 DIAGNOSIS — E785 Hyperlipidemia, unspecified: Secondary | ICD-10-CM | POA: Diagnosis not present

## 2020-11-19 ENCOUNTER — Ambulatory Visit: Payer: Medicare HMO | Admitting: Internal Medicine

## 2020-11-19 LAB — LIPID PANEL
Chol/HDL Ratio: 4.2 ratio (ref 0.0–4.4)
Cholesterol, Total: 143 mg/dL (ref 100–199)
HDL: 34 mg/dL — ABNORMAL LOW (ref >39)
LDL Chol Calc (NIH): 79 mg/dL (ref 0–99)
Triglycerides: 175 mg/dL — ABNORMAL HIGH (ref 0–149)
VLDL Cholesterol Cal: 30 mg/dL (ref 5–40)

## 2020-11-24 ENCOUNTER — Encounter: Payer: Self-pay | Admitting: Internal Medicine

## 2020-11-24 ENCOUNTER — Telehealth (INDEPENDENT_AMBULATORY_CARE_PROVIDER_SITE_OTHER): Payer: Medicare HMO | Admitting: Internal Medicine

## 2020-11-24 VITALS — Ht 63.0 in | Wt 246.0 lb

## 2020-11-24 DIAGNOSIS — E785 Hyperlipidemia, unspecified: Secondary | ICD-10-CM

## 2020-11-24 DIAGNOSIS — M791 Myalgia, unspecified site: Secondary | ICD-10-CM

## 2020-11-24 DIAGNOSIS — T466X5D Adverse effect of antihyperlipidemic and antiarteriosclerotic drugs, subsequent encounter: Secondary | ICD-10-CM

## 2020-11-24 DIAGNOSIS — I739 Peripheral vascular disease, unspecified: Secondary | ICD-10-CM

## 2020-11-24 DIAGNOSIS — T466X5A Adverse effect of antihyperlipidemic and antiarteriosclerotic drugs, initial encounter: Secondary | ICD-10-CM

## 2020-11-24 NOTE — Patient Instructions (Signed)
Medication Instructions:  Your physician recommends that you continue on your current medications as directed. Please refer to the Current Medication list given to you today.  *If you need a refill on your cardiac medications before your next appointment, please call your pharmacy*   Lab Work: FASTING lab work in 6 months  If you have labs (blood work) drawn today and your tests are completely normal, you will receive your results only by: Marland Kitchen MyChart Message (if you have MyChart) OR . A paper copy in the mail If you have any lab test that is abnormal or we need to change your treatment, we will call you to review the results.   Testing/Procedures: NONE   Follow-Up: At [facility], you and your health needs are our priority.  As part of our continuing mission to provide you with exceptional heart care, we have created designated Provider Care Teams.  These Care Teams include your primary Cardiologist (physician) and Advanced Practice Providers (APPs -  Physician Assistants and Nurse Practitioners) who all work together to provide you with the care you need, when you need it.  We recommend signing up for the patient portal called "MyChart".  Sign up information is provided on this After Visit Summary.  MyChart is used to connect with patients for Virtual Visits (Telemedicine).  Patients are able to view lab/test results, encounter notes, upcoming appointments, etc.  Non-urgent messages can be sent to your provider as well.   To learn more about what you can do with MyChart, go to [email or web address].    Your next appointment:   6 month(s) - lipid clinic  The format for your next appointment:   In Person or Virtual  Provider:   K. Mali Hilty, MD   Other Instructions

## 2020-11-24 NOTE — Progress Notes (Signed)
  Virtual Visit via Video Note   This visit type was conducted due to national recommendations for restrictions regarding the COVID-19 Pandemic (e.g. social distancing) in an effort to limit this patient's exposure and mitigate transmission in our community.  Due to her co-morbid illnesses, this patient is at least at moderate risk for complications without adequate follow up.  This format is felt to be most appropriate for this patient at this time.  The patient does have access to video technology  All issues noted in this document were discussed and addressed.  A limited physical exam could be performed with this format.  Please refer to the patient's chart for her  consent to telehealth for [facility].   Date:  11/24/2020   ID:  Leshay H Bonneville, DOB 01/06/1966, MRN [id number] The patient was identified using 2 identifiers.  Evaluation Performed:  Follow-Up Visit  Patient Location:  [address] [place] [address]  Provider location:   [address], [address] [place], [place] [address]  PCP:  Tower, Marne A, MD  Cardiologist:  No primary care provider on file. Electrophysiologist:  None   Chief Complaint:  Manage dyslipidemia  History of Present Illness:    Teresa Mcgee is a 55 y.o. female who presents via audio/video conferencing for a telehealth visit today.  This is a pleasant 55-year-old female with type 2 diabetes, dyslipidemia, hypertension and history of statin intolerance to both atorvastatin and simvastatin, who presents for evaluation and management of dyslipidemia.  She recently had a lipid profile which demonstrated a total cholesterol of 204, triglycerides 117, HDL 46 and LDL 134.  Hemoglobin A1c is 5.8.  She has no known history of heart disease although she had a brother and father both who had heart disease in their 50s.  A CT scan of the abdomen and pelvis was done along with a CT angio of the chest in October 2019 for shortness of breath and  upper abdominal pain, this did not suggest any coronary artery calcification however there is evidence for "peripheral calcified atherosclerotic plaque".  Based on this, she does need aggressive lipid-lowering and would target an LDL to less than 70.  Unfortunately she has not been able to tolerate statins having myalgias.  She is hesitant to try another statin at this point.  She was placed on ezetimibe however her cholesterol remains above target.  We discussed options today.  11/24/2020  Ms. Munshi seems to be doing well on Nexletol.  She is tolerating it without any significant side effects.  Repeat labs 6 days ago showed total cholesterol 143, triglycerides 175, HDL 34 and LDL at 79.  This is just slightly above goal although she has been more active recently and has made some dietary changes.  I suspect hopefully over the next several months that her cholesterol will become even lower.  The patient does not have symptoms concerning for COVID-19 infection (fever, chills, cough, or new SHORTNESS OF BREATH).    Prior CV studies:   The following studies were reviewed today:  Chart reviewed, lab work reviewed  PMHx:  Past Medical History:  Diagnosis Date  . Arthritis   . Bell's palsy may 2012  . Chest pain   . Diabetes mellitus without complication (HCC)   . GERD (gastroesophageal reflux disease)   . H/O hiatal hernia   . Hyperlipidemia   . Hypertension   . Hypothyroidism   . Shingles   . Shortness of breath   . Sleep   apnea    uses cpap    Past Surgical History:  Procedure Laterality Date  . ABDOMINAL HYSTERECTOMY    . CESAREAN SECTION    . cesarian     3x  . COLONOSCOPY WITH PROPOFOL N/A 11/27/2019   Procedure: COLONOSCOPY WITH PROPOFOL;  Surgeon: Anna, Kiran, MD;  Location: [facility];  Service: Gastroenterology;  Laterality: N/A;  . ESOPHAGOGASTRODUODENOSCOPY (EGD) WITH PROPOFOL N/A 07/20/2020   Procedure: ESOPHAGOGASTRODUODENOSCOPY (EGD) WITH PROPOFOL;  Surgeon: Anna,  Kiran, MD;  Location: [facility];  Service: Gastroenterology;  Laterality: N/A;  . PARTIAL HYSTERECTOMY  2000   abnormal uterine bleeding  . THYROIDECTOMY  1998  . TOOTH EXTRACTION    . TUBAL LIGATION      FAMHx:  Family History  Problem Relation Age of Onset  . Heart attack Father 56       deceased  . Diabetes Brother   . Stroke Brother 25       twice  . Stroke Sister 45  . Diabetes Sister   . Heart attack Sister 43  . Heart attack Brother 39  . Breast cancer Maternal Aunt     SOCHx:   reports that she has been smoking cigarettes. She has a 40.00 pack-year smoking history. She has never used smokeless tobacco. She reports that she does not drink alcohol and does not use drugs.  ALLERGIES:  No Known Allergies  MEDS:  Current Meds  Medication Sig  . Accu-Chek FastClix Lancets MISC USE UP TO 4 TIMES DAILY AS DIRECTED  . ACCU-CHEK GUIDE test strip USE UP TO 4 TIMES DAILY AS DIRECTED  . albuterol (VENTOLIN HFA) 108 (90 Base) MCG/ACT inhaler INHALE 2 PUFFS INTO THE LUNGS EVERY 4 HOURS AS NEEDED FOR (COUGH, SHORTNESS OF BREATH OR WHEEZING.).  . amLODipine (NORVASC) 10 MG tablet TAKE 1 TABLET BY MOUTH EVERY DAY  . azelastine (OPTIVAR) 0.05 % ophthalmic solution Place 1 drop into both eyes 2 (two) times daily as needed.  . Bempedoic Acid (NEXLETOL) 180 MG TABS Take 180 mg by mouth daily.  . blood glucose meter kit and supplies Dispense based on patient and insurance preference. Use up to four times daily as directed. (FOR ICD-10 E10.9, E11.9).  . [employer] D3 125 MCG (5000 UT) capsule TAKE 1 CAPSULE BY MOUTH DAILY WITH BREAKFAST. TAKE ALONG WITH CALCIUM AND MAGNESIUM.  . ezetimibe (ZETIA) 10 MG tablet TAKE 1 TABLET BY MOUTH EVERY DAY  . fluticasone (FLONASE) 50 MCG/ACT nasal spray SPRAY 2 SPRAYS INTO EACH NOSTRIL EVERY DAY  . hydrochlorothiazide (HYDRODIURIL) 25 MG tablet Take 1 tablet (25 mg total) by mouth daily. as directed  . levocetirizine (XYZAL) 5 MG tablet TAKE 1 TABLET BY  MOUTH EVERY DAY IN THE EVENING  . levothyroxine (SYNTHROID) 125 MCG tablet Take 1 tablet (125 mcg total) by mouth daily.  . Magnesium 500 MG CAPS Take 1 capsule (500 mg total) by mouth 2 (two) times daily at 8 am and 10 pm.  . meloxicam (MOBIC) 15 MG tablet Take 1 tablet (15 mg total) by mouth daily as needed for pain. With food  . metFORMIN (GLUCOPHAGE) 500 MG tablet TAKE 1 TABLET (500 MG TOTAL) BY MOUTH 2 (TWO) TIMES DAILY WITH A MEAL.  . Olopatadine HCl 0.2 % SOLN INSTILL 1 DROP INTO BOTH EYES EVERY DAY AS NEEDED  . omeprazole (PRILOSEC) 40 MG capsule Take 1 capsule (40 mg total) by mouth daily.  . [START ON 01/10/2021] oxyCODONE (OXY IR/ROXICODONE) 5 MG immediate release tablet Take 1   tablet (5 mg total) by mouth every 8 (eight) hours as needed for severe pain. Must last 30 days  . REFRESH CELLUVISC 1 % GEL APPLY 1 DROP TO EYE 3 (THREE) TIMES DAILY.     ROS: Pertinent items noted in HPI and remainder of comprehensive ROS otherwise negative.  Labs/Other Tests and Data Reviewed:    Recent Labs: 04/16/2020: ALT 66; BUN 8; Creatinine, Ser 0.88; Hemoglobin 14.2; Platelets 182.0; Potassium 3.6; Sodium 141; TSH 1.59   Recent Lipid Panel Lab Results  Component Value Date/Time   CHOL 143 11/18/2020 10:55 AM   TRIG 175 (H) 11/18/2020 10:55 AM   HDL 34 (L) 11/18/2020 10:55 AM   CHOLHDL 4.2 11/18/2020 10:55 AM   CHOLHDL 4 04/16/2020 12:35 PM   LDLCALC 79 11/18/2020 10:55 AM   LDLCALC 159 (H) 11/16/2017 03:29 PM    Wt Readings from Last 3 Encounters:  11/24/20 246 lb (111.6 kg)  11/08/20 245 lb (111.1 kg)  09/23/20 246 lb 5 oz (111.7 kg)     Exam:    Vital Signs:  Ht 5' 3" (1.6 m)   Wt 246 lb (111.6 kg)   LMP  (LMP Unknown)   BMI 43.58 kg/m    General appearance: alert, no distress and morbidly obese Lungs: No visual respiratory difficulty Abdomen: Obese Extremities: extremities normal, atraumatic, no cyanosis or edema Skin: Skin color, texture, turgor normal. No rashes or  lesions Neurologic: Grossly normal Psych: Pleasant  ASSESSMENT & PLAN:    1. Mixed dyslipidemia, goal LDL less than 70 2. Type 2 diabetes 3. Hypertension 4. Family history of premature coronary disease 5. PAD with calcified peripheral atherosclerosis 6. Statin intolerant-myalgias  Ms. Mathwig has had a very good response to her Nexletol which she is tolerating well.  Her LDL is now at 79, just slightly above target.  I hope with more weight loss and dietary changes over the next 6 months as well as increasing physical activity that she will reach target.  No changes to her medicine today.  Repeat labs in 6 months and follow-up with me at that time.  COVID-19 Education: The signs and symptoms of COVID-19 were discussed with the patient and how to seek care for testing (follow up with PCP or arrange E-visit).  The importance of social distancing was discussed today.  Patient Risk:   After full review of this patients clinical status, I feel that they are at least moderate risk at this time.  Time:   Today, I have spent 15 minutes with the patient with telehealth technology discussing dyslipidemia, goal LDL cholesterol.     Medication Adjustments/Labs and Tests Ordered: Current medicines are reviewed at length with the patient today.  Concerns regarding medicines are outlined above.   Tests Ordered: No orders of the defined types were placed in this encounter.   Medication Changes: No orders of the defined types were placed in this encounter.   Disposition:  in 6 month(s)  Kenneth C. Hilty, MD, FACC, FACP  [place]  [facility]  Medical Director of the [facility] Diplomate of the American Board of Clinical Lipidology Attending Cardiologist  Direct Dial: [phone]  Fax: [phone]  Website:  [email or web address]  Kenneth C Hilty, MD  11/24/2020 8:55 AM     

## 2020-11-29 ENCOUNTER — Ambulatory Visit: Payer: Medicare HMO

## 2020-11-29 ENCOUNTER — Telehealth: Payer: Self-pay | Admitting: *Deleted

## 2020-11-29 MED ORDER — CETIRIZINE HCL 10 MG PO TABS: 10.0000 mg | ORAL_TABLET | Freq: Every day | ORAL | 11 refills | Status: DC

## 2020-11-29 MED ORDER — FLUTICASONE PROPIONATE 50 MCG/ACT NA SUSP: NASAL | 3 refills | Status: DC

## 2020-11-29 NOTE — Telephone Encounter (Signed)
I sent zyrtec and flonase If insurance does not cover then she can get both otc

## 2020-11-29 NOTE — Telephone Encounter (Signed)
Pt left message would like flonase refilled but also wanted to see if PCP can prescribe a different allergy med. Pt is on xyzal but it's not helping. Pt still has watery/itchy eyes, and a runny nose and sneezing. Pt is asking if PCP can send in a different allergy med as well as a refill of her flonase  [facility]

## 2020-11-30 NOTE — Telephone Encounter (Signed)
Pt.notified

## 2020-12-08 ENCOUNTER — Ambulatory Visit (INDEPENDENT_AMBULATORY_CARE_PROVIDER_SITE_OTHER): Payer: Medicare HMO

## 2020-12-08 DIAGNOSIS — Z0000 Encounter for general adult medical examination without abnormal findings: Secondary | ICD-10-CM | POA: Diagnosis not present

## 2020-12-08 NOTE — Progress Notes (Signed)
Subjective:   Teresa Mcgee is a 55 y.o. female who presents for Medicare Annual (Subsequent) preventive examination.  Review of Systems: N/A      I connected with the patient today by telephone and verified that I am speaking with the correct person using two identifiers. Location patient: home Location nurse: work Persons participating in the telephone visit: patient, nurse.   I discussed the limitations, risks, security and privacy concerns of performing an evaluation and management service by telephone and the availability of in person appointments. I also discussed with the patient that there may be a patient responsible charge related to this service. The patient expressed understanding and verbally consented to this telephonic visit.        Cardiac Risk Factors include: diabetes mellitus;hypertension;smoking/ tobacco exposure;Other (see comment), Risk factor comments: hypercholesterolemia     Objective:    Today's Vitals   There is no height or weight on file to calculate BMI.  Advanced Directives 12/08/2020 08/11/2020 07/20/2020 11/28/2019 11/27/2019 11/12/2018 07/01/2018  Does Patient Have a Medical Advance Directive? _0  No No  Type of Advance Directive - - - - - - Living will;Healthcare Power of Attorney  Would patient like information on creating a medical advance directive? No - Patient declined No - Patient declined - No - Patient declined No - Patient declined Yes (MAU/Ambulatory/Procedural Areas - Information given) -  Pre-existing out of facility DNR order (yellow form or pink MOST form) - - - - - - -    Current Medications (verified) Outpatient Encounter Medications as of 12/08/2020  Medication Sig  . Accu-Chek FastClix Lancets MISC USE UP TO 4 TIMES DAILY AS DIRECTED  . ACCU-CHEK GUIDE test strip USE UP TO 4 TIMES DAILY AS DIRECTED  . albuterol (VENTOLIN HFA) 108 (90 Base) MCG/ACT inhaler INHALE 2 PUFFS INTO THE LUNGS EVERY 4 HOURS AS NEEDED FOR  (COUGH, SHORTNESS OF BREATH OR WHEEZING.).  Marland Kitchen amLODipine (NORVASC) 10 MG tablet TAKE 1 TABLET BY MOUTH EVERY DAY  . azelastine (OPTIVAR) 0.05 % ophthalmic solution Place 1 drop into both eyes 2 (two) times daily as needed.  . Bempedoic Acid (NEXLETOL) 180 MG TABS Take 180 mg by mouth daily.  . blood glucose meter kit and supplies Dispense based on patient and insurance preference. Use up to four times daily as directed. (FOR ICD-10 E10.9, E11.9).  . cetirizine (ZYRTEC) 10 MG tablet Take 1 tablet (10 mg total) by mouth daily.  . [employer] D3 125 MCG (5000 UT) capsule TAKE 1 CAPSULE BY MOUTH DAILY WITH BREAKFAST. TAKE ALONG WITH CALCIUM AND MAGNESIUM.  Marland Kitchen ezetimibe (ZETIA) 10 MG tablet TAKE 1 TABLET BY MOUTH EVERY DAY  . fluticasone (FLONASE) 50 MCG/ACT nasal spray SPRAY 2 SPRAYS INTO EACH NOSTRIL EVERY DAY  . hydrochlorothiazide (HYDRODIURIL) 25 MG tablet Take 1 tablet (25 mg total) by mouth daily. as directed  . levocetirizine (XYZAL) 5 MG tablet TAKE 1 TABLET BY MOUTH EVERY DAY IN THE EVENING  . levothyroxine (SYNTHROID) 125 MCG tablet Take 1 tablet (125 mcg total) by mouth daily.  . Magnesium 500 MG CAPS Take 1 capsule (500 mg total) by mouth 2 (two) times daily at 8 am and 10 pm.  . meloxicam (MOBIC) 15 MG tablet Take 1 tablet (15 mg total) by mouth daily as needed for pain. With food  . metFORMIN (GLUCOPHAGE) 500 MG tablet TAKE 1 TABLET (500 MG TOTAL) BY MOUTH 2 (TWO) TIMES DAILY WITH A MEAL.  Marland Kitchen Olopatadine HCl  0.2 % SOLN INSTILL 1 DROP INTO BOTH EYES EVERY DAY AS NEEDED  . omeprazole (PRILOSEC) 40 MG capsule Take 1 capsule (40 mg total) by mouth daily.  Marland Kitchen oxyCODONE (OXY IR/ROXICODONE) 5 MG immediate release tablet Take 1 tablet (5 mg total) by mouth every 8 (eight) hours as needed for severe pain. Must last 30 days  . [START ON 12/11/2020] oxyCODONE (OXY IR/ROXICODONE) 5 MG immediate release tablet Take 1 tablet (5 mg total) by mouth every 8 (eight) hours as needed for severe pain. Must last 30 days   . [START ON 01/10/2021] oxyCODONE (OXY IR/ROXICODONE) 5 MG immediate release tablet Take 1 tablet (5 mg total) by mouth every 8 (eight) hours as needed for severe pain. Must last 30 days  . REFRESH CELLUVISC 1 % GEL APPLY 1 DROP TO EYE 3 (THREE) TIMES DAILY.  Marland Kitchen gabapentin (NEURONTIN) 100 MG capsule Take 1 capsule (100 mg total) by mouth 2 (two) times daily AND 3 capsules (300 mg total) at bedtime.  Marland Kitchen tiZANidine (ZANAFLEX) 4 MG tablet Take 1 tablet (4 mg total) by mouth every 8 (eight) hours as needed for muscle spasms.   No facility-administered encounter medications on file as of 12/08/2020.    Allergies (verified) Patient has no known allergies.   History: Past Medical History:  Diagnosis Date  . Arthritis   . Bell's palsy may 2012  . Chest pain   . Diabetes mellitus without complication ([place])   . GERD (gastroesophageal reflux disease)   . H/O hiatal hernia   . Hyperlipidemia   . Hypertension   . Hypothyroidism   . Shingles   . Shortness of breath   . Sleep apnea    uses cpap   Past Surgical History:  Procedure Laterality Date  . ABDOMINAL HYSTERECTOMY    . CESAREAN SECTION    . cesarian     3x  . COLONOSCOPY WITH PROPOFOL N/A 11/27/2019   Procedure: COLONOSCOPY WITH PROPOFOL;  Surgeon: Jonathon Bellows, MD;  Location: [facility] ENDOSCOPY;  Service: Gastroenterology;  Laterality: N/A;  . ESOPHAGOGASTRODUODENOSCOPY (EGD) WITH PROPOFOL N/A 07/20/2020   Procedure: ESOPHAGOGASTRODUODENOSCOPY (EGD) WITH PROPOFOL;  Surgeon: Jonathon Bellows, MD;  Location[facility] END[facility]COPY;  Service: Gastroenterology;  Laterality: N/A;  . PARTIAL HYSTERECTOMY  2000   abnormal uterine bleeding  . THYROIDECTOMY  1998  . TOOTH EXTRACTION    . TUBAL LIGATION     Family History  Problem Relation Age of Onset  . Heart attack Father 66       deceased  . Diabetes Brother   . Stroke Brother 25       twice  . Stroke Sister 67  . Diabetes Sister   . Heart attack Sister 32  . Heart attack Brother 3  . Breast  cancer Maternal Aunt    Social History   Socioeconomic History  . Marital status: Married    Spouse name: Not on file  . Number of children: Not on file  . Years of education: Not on file  . Highest education level: Not on file  Occupational History  . Occupation: disabled    Employer: [employer]    Comment: back  Tobacco Use  . Smoking status: Current Every Day Smoker    Packs/day: 0.25    Years: 20.00    Pack years: 5.00    Types: Cigarettes  . Smokeless tobacco: Never Used  . Tobacco comment: 2-3 cigs daily-09/22/2020  Vaping Use  . Vaping Use: Never used  Substance and Sexual Activity  .  Alcohol use: No  . Drug use: No  . Sexual activity: Not Currently  Other Topics Concern  . Not on file  Social History Narrative   Married but she and husband separated, she helps take care of him (he is in poor health)   She lives with her grown son who is mentally disabled   She has been on disability since 2011   Social Determinants of Health   Financial Resource Strain: [place]   . Difficulty of Paying Living Expenses: Not hard at all  Food Insecurity: No Food Insecurity  . Worried About Charity fundraiser in the Last Year: Never true  . Ran Out of Food in the Last Year: Never true  Transportation Needs: No Transportation Needs  . Lack of Transportation (Medical): No  . Lack of Transportation (Non-Medical): No  Physical Activity: Inactive  . Days of Exercise per Week: 0 days  . Minutes of Exercise per Session: 0 min  Stress: No Stress Concern Present  . Feeling of Stress : Not at all  Social Connections: Not on file    Tobacco Counseling Ready to quit: Not Answered Counseling given: Not Answered Comment: 2-3 cigs daily-09/22/2020   Clinical Intake:  Pre-visit preparation completed: Yes  Pain : No/denies pain     Nutritional Risks: None Diabetes: Yes CBG done?: No Did pt. bring in CBG monitor from home?: No  How often do you need to have someone help  you when you read instructions, pamphlets, or other written materials from your doctor or pharmacy?: 1 - Never What is the last grade level you completed in school?: GED  Diabetic: Yes Nutrition Risk Assessment:  Has the patient had any N/V/D within the last 2 months?  No  Does the patient have any non-healing wounds?  No  Has the patient had any unintentional weight loss or weight gain?  No   Diabetes:  Is the patient diabetic?  Yes  If diabetic, was a CBG obtained today?  No  telephone visit  Did the patient bring in their glucometer from home?  No  telephone visit  How often do you monitor your CBG's? daily.   Financial Strains and Diabetes Management:  Are you having any financial strains with the device, your supplies or your medication? No .  Does the patient want to be seen by Chronic Care Management for management of their diabetes?  No  Would the patient like to be referred to a Nutritionist or for Diabetic Management?  No   Diabetic Exams:  Diabetic Eye Exam: Overdue for diabetic eye exam. Pt has been advised about the importance in completing this exam. Patient advised to call and schedule an eye exam. Diabetic Foot Exam: Completed 06/14/2020   Interpreter Needed?: No  Information entered by :: [place], LPN   Activities of Daily Living In your present state of health, do you have any difficulty performing the following activities: 12/08/2020  Hearing? N  Vision? N  Difficulty concentrating or making decisions? N  Walking or climbing stairs? N  Dressing or bathing? N  Doing errands, shopping? N  Preparing Food and eating ? N  Using the Toilet? N  In the past six months, have you accidently leaked urine? N  Do you have problems with loss of bowel control? N  Managing your Medications? N  Managing your Finances? N  Housekeeping or managing your Housekeeping? N  Some recent data might be hidden    Patient Care Team: [place], Wynelle Fanny,  MD as PCP - General  (Family Medicine)  Indicate any recent Medical Services you may have received from other than [facility] providers in the past year (date may be approximate).     Assessment:   This is a routine wellness examination for Masen.  Hearing/Vision screen  Hearing Screening   _0  _1  _2  _3  _4  _5  _6  _7  _8   Right ear:           Left ear:           Vision Screening Comments: Patient gets annual eye exams  Dietary issues and exercise activities discussed: Current Exercise Habits: The patient does not participate in regular exercise at present, Exercise limited by: None identified  Goals    . Patient Stated     11/28/2019, I will maintain and continue medications as prescribed.     . Patient Stated     12/08/2020, I will maintain and continue medications as prescribed.       Depression Screen PHQ 2/9 Scores 12/08/2020 08/11/2020 04/16/2020 11/28/2019 10/15/2019 08/19/2019 03/28/2019  PHQ - 2 Score 0 0 0 0 0 0 0  PHQ- 9 Score 0 - 1 0 - - -    Fall Risk Fall Risk  12/08/2020 11/08/2020 08/11/2020 02/11/2020 11/28/2019  Falls in the past year? 0 0 0 0 1  Comment - - - - slid in the mud  Number falls in past yr: 0 - - - 0  Injury with Fall? 0 - - - 0  Risk for fall due to : Medication side effect - - - Medication side effect  Follow up Falls evaluation completed;Falls prevention discussed - - - Falls evaluation completed;Falls prevention discussed    FALL RISK PREVENTION PERTAINING TO THE HOME:  Any stairs in or around the home? Yes  If so, are there any without handrails? No  Home free of loose throw rugs in walkways, pet beds, electrical cords, etc? Yes  Adequate lighting in your home to reduce risk of falls? Yes   ASSISTIVE DEVICES UTILIZED TO PREVENT FALLS:  Life alert? No  Use of a cane, walker or w/c? No  Grab bars in the bathroom? Yes  Shower chair or bench in shower? Yes  Elevated toilet seat or a handicapped toilet? No   TIMED UP AND GO:  Was the test  performed? N/A telephone visit .    Cognitive Function: MMSE - Mini Mental State Exam 12/08/2020 11/28/2019  Not completed: Refused -  Orientation to time - 5  Orientation to Place - 5  Registration - 3  Attention/ Calculation - 5  Recall - 3  Language- repeat - 1  Mini Cog  Mini-Cog screen was not completed. Patient refused. Maximum score is 22. A value of 0 denotes this part of the MMSE was not completed or the patient failed this part of the Mini-Cog screening.       Immunizations Immunization History  Administered Date(s) Administered  . Influenza Split 05/23/2011, 04/24/2012  . Influenza,inj,Quad PF,6+ Mos 06/11/2019  . PFIZER(Purple Top)SARS-COV-2 Vaccination 11/12/2019, 12/03/2019, 08/09/2020  . Tdap 04/25/2011    TDAP status: Up to date  Flu Vaccine status: due Fall 2022  Pneumococcal vaccine status: due at age 57   Covid-19 vaccine status: Completed vaccines  Qualifies for Shingles Vaccine? Yes   Zostavax completed No   Shingrix Completed?: No.    Education has been provided regarding the importance of this vaccine. Patient has been advised to call insurance company to determine out of pocket  expense if they have not yet received this vaccine. Advised may also receive vaccine at local pharmacy or Health Dept. Verbalized acceptance and understanding.  Screening Tests Health Maintenance  Topic Date Due  . PAP SMEAR-Modifier  06/07/2014  . OPHTHALMOLOGY EXAM  05/29/2020  . HEMOGLOBIN A1C  10/14/2020  . PNEUMOCOCCAL POLYSACCHARIDE VACCINE AGE 30-64 HIGH RISK  11/28/2023 (Originally 07/29/1968)  . INFLUENZA VACCINE  03/14/2021  . URINE MICROALBUMIN  04/16/2021  . TETANUS/TDAP  04/24/2021  . FOOT EXAM  06/14/2021  . MAMMOGRAM  11/02/2021  . COLONOSCOPY (Pts 45-61yr Insurance coverage will need to be confirmed)  11/26/2024  . COVID-19 Vaccine  Completed  . Hepatitis C Screening  Completed  . HIV Screening  Completed  . HPV VACCINES  Aged Out    Health  Maintenance  Health Maintenance Due  Topic Date Due  . PAP SMEAR-Modifier  06/07/2014  . OPHTHALMOLOGY EXAM  05/29/2020  . HEMOGLOBIN A1C  10/14/2020    Colorectal cancer screening: Type of screening: Colonoscopy. Completed 11/27/2019. Repeat every 5 years  Mammogram status: Completed 08/09/2020. Repeat every year  Bone Density status: due at age 55 Lung Cancer Screening: (Low Dose CT Chest recommended if Age 668-80years, 30 pack-year currently smoking OR have quit w/in 15years.) does not qualify.    Additional Screening:  Hepatitis C Screening: does qualify; Completed 04/16/2020  Vision Screening: Recommended annual ophthalmology exams for early detection of glaucoma and other disorders of the eye. Is the patient up to date with their annual eye exam?  Yes  Who is the provider or what is the name of the office in which the patient attends annual eye exams? [facility] If pt is not established with a provider, would they like to be referred to a provider to establish care? No .   Dental Screening: Recommended annual dental exams for proper oral hygiene  Community Resource Referral / Chronic Care Management: CRR required this visit?  No   CCM required this visit?  No      Plan:     I have personally reviewed and noted the following in the patient's chart:   . Medical and social history . Use of alcohol, tobacco or illicit drugs  . Current medications and supplements . Functional ability and status . Nutritional status . Physical activity . Advanced directives . List of other physicians . Hospitalizations, surgeries, and ER visits in previous 12 months . Vitals . Screenings to include cognitive, depression, and falls . Referrals and appointments  In addition, I have reviewed and discussed with patient certain preventive protocols, quality metrics, and best practice recommendations. A written personalized care plan for preventive services as well as general  preventive health recommendations were provided to patient.   Due to this being a telephonic visit, the after visit summary with patients personalized plan was offered to patient via office or my-chart.  Patient preferred to pick up at office at next visit or via mychart.   JAndrez Grime LPN   [id number]

## 2020-12-08 NOTE — Patient Instructions (Signed)
Ms. Secord , Thank you for taking time to come for your Medicare Wellness Visit. I appreciate your ongoing commitment to your health goals. Please review the following plan we discussed and let me know if I can assist you in the future.   Screening recommendations/referrals: Colonoscopy: Up to date, completed 11/27/2019, due 11/2024 Mammogram: Up to date, completed 08/09/2020, due 07/2021 Bone Density: at age 55  Recommended yearly ophthalmology/optometry visit for glaucoma screening and checkup Recommended yearly dental visit for hygiene and checkup  Vaccinations: Influenza vaccine: due Fall 2022 Pneumococcal vaccine: due at age 86  Tdap vaccine: Up to date, completed 04/25/2011, due 04/2021 Shingles vaccine: due, check with your insurance regarding coverage if interested   Covid-19: Completed series  Advanced directives: Advance directive discussed with you today. Even though you declined this today please call our office should you change your mind and we can give you the proper paperwork for you to fill out.   Conditions/risks identified: diabetes, hypertension, hypercholesterolemia   Next appointment: Follow up in one year for your annual wellness visit.   Preventive Care 40-64 Years, Female Preventive care refers to lifestyle choices and visits with your health care provider that can promote health and wellness. What does preventive care include?  A yearly physical exam. This is also called an annual well check.  Dental exams once or twice a year.  Routine eye exams. Ask your health care provider how often you should have your eyes checked.  Personal lifestyle choices, including:  Daily care of your teeth and gums.  Regular physical activity.  Eating a healthy diet.  Avoiding tobacco and drug use.  Limiting alcohol use.  Practicing safe sex.  Taking low-dose aspirin daily starting at age 43.  Taking vitamin and mineral supplements as recommended by your health care  provider. What happens during an annual well check? The services and screenings done by your health care provider during your annual well check will depend on your age, overall health, lifestyle risk factors, and family history of disease. Counseling  Your health care provider may ask you questions about your:  Alcohol use.  Tobacco use.  Drug use.  Emotional well-being.  Home and relationship well-being.  Sexual activity.  Eating habits.  Work and work Statistician.  Method of birth control.  Menstrual cycle.  Pregnancy history. Screening  You may have the following tests or measurements:  Height, weight, and BMI.  Blood pressure.  Lipid and cholesterol levels. These may be checked every 5 years, or more frequently if you are over 60 years old.  Skin check.  Lung cancer screening. You may have this screening every year starting at age 37 if you have a 30-pack-year history of smoking and currently smoke or have quit within the past 15 years.  Fecal occult blood test (FOBT) of the stool. You may have this test every year starting at age 2.  Flexible sigmoidoscopy or colonoscopy. You may have a sigmoidoscopy every 5 years or a colonoscopy every 10 years starting at age 50.  Hepatitis C blood test.  Hepatitis B blood test.  Sexually transmitted disease (STD) testing.  Diabetes screening. This is done by checking your blood sugar (glucose) after you have not eaten for a while (fasting). You may have this done every 1-3 years.  Mammogram. This may be done every 1-2 years. Talk to your health care provider about when you should start having regular mammograms. This may depend on whether you have a family history of breast cancer.  BRCA-related cancer screening. This may be done if you have a family history of breast, ovarian, tubal, or peritoneal cancers.  Pelvic exam and Pap test. This may be done every 3 years starting at age 45. Starting at age 58, this may be  done every 5 years if you have a Pap test in combination with an HPV test.  Bone density scan. This is done to screen for osteoporosis. You may have this scan if you are at high risk for osteoporosis. Discuss your test results, treatment options, and if necessary, the need for more tests with your health care provider. Vaccines  Your health care provider may recommend certain vaccines, such as:  Influenza vaccine. This is recommended every year.  Tetanus, diphtheria, and acellular pertussis (Tdap, Td) vaccine. You may need a Td booster every 10 years.  Zoster vaccine. You may need this after age 69.  Pneumococcal 13-valent conjugate (PCV13) vaccine. You may need this if you have certain conditions and were not previously vaccinated.  Pneumococcal polysaccharide (PPSV23) vaccine. You may need one or two doses if you smoke cigarettes or if you have certain conditions. Talk to your health care provider about which screenings and vaccines you need and how often you need them. This information is not intended to replace advice given to you by your health care provider. Make sure you discuss any questions you have with your health care provider. Document Released: 08/27/2015 Document Revised: 04/19/2016 Document Reviewed: 06/01/2015 Elsevier Interactive Patient Education  2017 [place] Prevention in the Home Falls can cause injuries. They can happen to people of all ages. There are many things you can do to make your home safe and to help prevent falls. What can I do on the outside of my home?  Regularly fix the edges of walkways and driveways and fix any cracks.  Remove anything that might make you trip as you walk through a door, such as a raised step or threshold.  Trim any bushes or trees on the path to your home.  Use bright outdoor lighting.  Clear any walking paths of anything that might make someone trip, such as rocks or tools.  Regularly check to see if  handrails are loose or broken. Make sure that both sides of any steps have handrails.  Any raised decks and porches should have guardrails on the edges.  Have any leaves, snow, or ice cleared regularly.  Use sand or salt on walking paths during winter.  Clean up any spills in your garage right away. This includes oil or grease spills. What can I do in the bathroom?  Use night lights.  Install grab bars by the toilet and in the tub and shower. Do not use towel bars as grab bars.  Use non-skid mats or decals in the tub or shower.  If you need to sit down in the shower, use a plastic, non-slip stool.  Keep the floor dry. Clean up any water that spills on the floor as soon as it happens.  Remove soap buildup in the tub or shower regularly.  Attach bath mats securely with double-sided non-slip rug tape.  Do not have throw rugs and other things on the floor that can make you trip. What can I do in the bedroom?  Use night lights.  Make sure that you have a light by your bed that is easy to reach.  Do not use any sheets or blankets that are too big for your bed. They  should not hang down onto the floor.  Have a firm chair that has side arms. You can use this for support while you get dressed.  Do not have throw rugs and other things on the floor that can make you trip. What can I do in the kitchen?  Clean up any spills right away.  Avoid walking on wet floors.  Keep items that you use a lot in easy-to-reach places.  If you need to reach something above you, use a strong step stool that has a grab bar.  Keep electrical cords out of the way.  Do not use floor polish or wax that makes floors slippery. If you must use wax, use non-skid floor wax.  Do not have throw rugs and other things on the floor that can make you trip. What can I do with my stairs?  Do not leave any items on the stairs.  Make sure that there are handrails on both sides of the stairs and use them. Fix  handrails that are broken or loose. Make sure that handrails are as long as the stairways.  Check any carpeting to make sure that it is firmly attached to the stairs. Fix any carpet that is loose or worn.  Avoid having throw rugs at the top or bottom of the stairs. If you do have throw rugs, attach them to the floor with carpet tape.  Make sure that you have a light switch at the top of the stairs and the bottom of the stairs. If you do not have them, ask someone to add them for you. What else can I do to help prevent falls?  Wear shoes that:  Do not have high heels.  Have rubber bottoms.  Are comfortable and fit you well.  Are closed at the toe. Do not wear sandals.  If you use a stepladder:  Make sure that it is fully opened. Do not climb a closed stepladder.  Make sure that both sides of the stepladder are locked into place.  Ask someone to hold it for you, if possible.  Clearly mark and make sure that you can see:  Any grab bars or handrails.  First and last steps.  Where the edge of each step is.  Use tools that help you move around (mobility aids) if they are needed. These include:  Canes.  Walkers.  Scooters.  Crutches.  Turn on the lights when you go into a dark area. Replace any light bulbs as soon as they burn out.  Set up your furniture so you have a clear path. Avoid moving your furniture around.  If any of your floors are uneven, fix them.  If there are any pets around you, be aware of where they are.  Review your medicines with your doctor. Some medicines can make you feel dizzy. This can increase your chance of falling. Ask your doctor what other things that you can do to help prevent falls. This information is not intended to replace advice given to you by your health care provider. Make sure you discuss any questions you have with your health care provider. Document Released: 05/27/2009 Document Revised: 01/06/2016 Document Reviewed:  09/04/2014 Elsevier Interactive Patient Education  2017 [employer].

## 2020-12-08 NOTE — Progress Notes (Signed)
PCP notes:  Health Maintenance: Eye exam- due    Abnormal Screenings: none   Patient concerns: none   Nurse concerns: none   Next PCP appt.: none

## 2020-12-24 ENCOUNTER — Other Ambulatory Visit: Payer: Self-pay | Admitting: Internal Medicine

## 2021-01-04 ENCOUNTER — Other Ambulatory Visit: Payer: Self-pay | Admitting: Gastroenterology

## 2021-01-05 ENCOUNTER — Telehealth: Payer: Self-pay | Admitting: Family Medicine

## 2021-01-05 DIAGNOSIS — J302 Other seasonal allergic rhinitis: Secondary | ICD-10-CM

## 2021-01-05 NOTE — Telephone Encounter (Signed)
  LAST APPOINTMENT DATE: 12/08/2020   NEXT APPOINTMENT DATE:@Visit  date not found  MEDICATION: levocertirizine  PHARMACY: [facility] [place]   Let patient know to contact pharmacy at the end of the day to make sure medication is ready.  Please notify patient to allow 48-72 hours to process  Encourage patient to contact the pharmacy for refills or they can request refills through [place]:   LAST REFILL:  QTY:  REFILL DATE:    OTHER COMMENTS:    Okay for refill?  Please advise

## 2021-01-06 MED ORDER — LEVOCETIRIZINE DIHYDROCHLORIDE 5 MG PO TABS: ORAL_TABLET | ORAL | 1 refills | Status: DC

## 2021-01-06 NOTE — Addendum Note (Signed)
Addended by: Tammi Sou on: 01/06/2021 12:21 PM   Modules accepted: Orders

## 2021-01-13 ENCOUNTER — Other Ambulatory Visit: Payer: Self-pay

## 2021-01-13 ENCOUNTER — Ambulatory Visit (INDEPENDENT_AMBULATORY_CARE_PROVIDER_SITE_OTHER): Payer: Medicare HMO | Admitting: Podiatry

## 2021-01-13 ENCOUNTER — Encounter: Payer: Self-pay | Admitting: Podiatry

## 2021-01-13 DIAGNOSIS — M79674 Pain in right toe(s): Secondary | ICD-10-CM | POA: Diagnosis not present

## 2021-01-13 DIAGNOSIS — M79675 Pain in left toe(s): Secondary | ICD-10-CM | POA: Diagnosis not present

## 2021-01-13 DIAGNOSIS — B351 Tinea unguium: Secondary | ICD-10-CM | POA: Diagnosis not present

## 2021-01-13 DIAGNOSIS — E119 Type 2 diabetes mellitus without complications: Secondary | ICD-10-CM | POA: Diagnosis not present

## 2021-01-13 NOTE — Progress Notes (Signed)
This patient returns to my office for at risk foot care.  This patient requires this care by a professional since this patient will be at risk due to having diabetes.  This patient is unable to cut nails herself since the patient cannot reach her nails.These nails are painful walking and wearing shoes.  This patient presents for at risk foot care today.  General Appearance  Alert, conversant and in no acute stress.  Vascular  Dorsalis pedis   pulses are palpable  bilaterally.   Posterior tibial pulses are absent  B/L. Capillary return is within normal limits  bilaterally. Cold feet   Bilaterally.  Absent hair  B/L.  Neurologic  Senn-Weinstein monofilament wire test within normal limits  bilaterally. Muscle power within normal limits bilaterally.  Nails Thick disfigured discolored nails with subungual debris  from hallux to fifth toes bilaterally. No evidence of bacterial infection or drainage bilaterally.  Orthopedic  No limitations of motion  feet .  No crepitus or effusions noted.  No bony pathology or digital deformities noted.  Skin  normotropic skin with no porokeratosis noted bilaterally.  No signs of infections or ulcers noted.     Onychomycosis  Pain in right toes  Pain in left toes  Consent was obtained for treatment procedures.   Mechanical debridement of nails 1-5  bilaterally performed with a nail nipper.  Filed with dremel without incident.    Return office visit   3 months                   Told patient to return for periodic foot care and evaluation due to potential at risk complications.   Aijalon Kirtz DPM  

## 2021-01-21 ENCOUNTER — Telehealth: Payer: Self-pay

## 2021-01-21 NOTE — Telephone Encounter (Signed)
Pt woke up this am with a stuffy nose and hoarseness. No sore throat. No Covid exposure. Used Flonase and it did not help. Last few days taking Mucinex  for a cough that started the 6th or 7th. Cough has gotten a little better. No SOB, no fever. No Covid exposure that she is aware of. Covid vaccinated and 1 booster. Asking what else can she do or take? Uses [facility]. Please advise at [phone]

## 2021-01-21 NOTE — Telephone Encounter (Signed)
Pt notified of Dr. Tower's comments and verbalized understanding  

## 2021-01-21 NOTE — Telephone Encounter (Signed)
Zyrtec is on her list, that is good for runny nose and drip. Continue flonase If mucinex is plain- can either switch to mucinex DM to suppress cough or add delsym  Lots of fluids and rest  I recommend doing a home covid test  F/u or UC if worse or no improvement

## 2021-01-27 ENCOUNTER — Encounter: Payer: Self-pay | Admitting: Pain Medicine

## 2021-01-28 NOTE — Progress Notes (Signed)
Patient: Teresa Mcgee  Service Category: E/M  Provider: Gaspar Cola, MD  DOB: November 08, 1965  DOS: 01/31/2021  Location: Office  MRN: [id number]  Setting: Ambulatory outpatient  Referring Provider: Abner Greenspan, MD  Type: Established Patient  Specialty: Interventional Pain Management  PCP: Abner Greenspan, MD  Location: Remote location  Delivery: TeleHealth     Virtual Encounter - Pain Management PROVIDER NOTE: Information contained herein reflects review and annotations entered in association with encounter. Interpretation of such information and data should be left to medically-trained personnel. Information provided to patient can be located elsewhere in the medical record under "Patient Instructions". Document created using STT-dictation technology, any transcriptional errors that may result from process are unintentional.    Contact & Pharmacy Preferred: [phone] Home: [phone] (home) Mobile: [phone] (mobile) E-mail: [email or web address]  [id number]: [phone]: [phone]  Pre-screening  Ms. HLieseroffered "in-person" vs "virtual" encounter. She indicated preferring virtual for this encounter.   Reason COVID-19*  Social distancing based on CDC and AMA recommendations.   I contacted Teresa Mcgee 01/31/2021 via telephone.      I clearly identified myself as F7/10/2020 MD. I verified that I was speaking with the correct person using two identifiers (Name: Teresa Mcgee and date of birth: 126-Oct-1967.  Consent I sought verbal advanced consent from M12/29/1967for virtual visit interactions. I informed Teresa Mcgee possible security and privacy concerns, risks, and limitations associated with providing "not-in-person" medical evaluation and management services. I also informed Ms. HAgentof the availability of "in-person" appointments.  Finally, I informed her that there would be a charge for the virtual visit and that she could be  personally, fully or partially, financially responsible for it. Teresa Mcgee understanding and agreed to proceed.   Historic Elements   Ms. MEDANA AGUADOis a 55y.o. year old, female patient evaluated today after our last contact on 11/08/2020. Ms. HDovidio has a past medical history of Arthritis, Bell's palsy (may 2012), Chest pain, Diabetes mellitus without complication ([place], GERD (gastroesophageal reflux disease), H/O hiatal hernia, Hyperlipidemia, Hypertension, Hypothyroidism, Shingles, Shortness of breath, and Sleep apnea. She also  has a past surgical history that includes Thyroidectomy (1998); cesarian; Partial hysterectomy (2000); Cesarean section; Tubal ligation; Tooth extraction; Abdominal hysterectomy; Colonoscopy with propofol (N/A, 11/27/2019); and Esophagogastroduodenoscopy (egd) with propofol (N/A, 07/20/2020). Teresa Mcgee a current medication list which includes the following prescription(s): accu-chek fastclix lancets, accu-chek guide, albuterol, amlodipine, azelastine, blood glucose meter kit and supplies, [facility] d[facility], ezetimibe, fluticasone, hydrochlorothiazide, levocetirizine, levothyroxine, magnesium, metformin, nexletol, olopatadine hcl, omeprazole, refresh celluvisc, gabapentin, meloxicam, [START ON 02/09/2021] oxycodone, and tizanidine. She  reports that she has been smoking cigarettes. She has a 5.00 pack-year smoking history. She has never used smokeless tobacco. She reports that she does not drink alcohol and does not use drugs. Ms. HMackerthas No Known Allergies.   HPI  Today, she is being contacted for medication management.  The patient indicates doing well with the current medication regimen. No adverse reactions or side effects reported to the medications.   RTCB: 03/11/2021 Nonopioids transfer 08/11/2020: Gabapentin, meloxicam, and Zanaflex  Pharmacotherapy Assessment   Analgesic: Oxycodone IR 5 mg, 1 tab PO q 8 hrs (PRN) (15 mg/day of oxycodone) MME/day: 22.5 mg/day.   Monitoring: [place] PMP: PDMP reviewed during this encounter.       Pharmacotherapy: No side-effects  or adverse reactions reported. Compliance: No problems identified. Effectiveness: Clinically acceptable. Plan: Refer to "POC".  UDS:  Summary  Date Value Ref Range Status  02/11/2020 Note  Final    Comment:    ==================================================================== [facility] Select 13 (MW) ==================================================================== Test                             Result       Flag       Units  Drug Present and Declared for Prescription Verification   Oxycodone                      142          EXPECTED   ng/mg creat   Oxymorphone                    603          EXPECTED   ng/mg creat   Noroxycodone                   220          EXPECTED   ng/mg creat   Noroxymorphone                 84           EXPECTED   ng/mg creat    Sources of oxycodone are scheduled prescription medications.    Oxymorphone, noroxycodone, and noroxymorphone are expected    metabolites of oxycodone. Oxymorphone is also available as a    scheduled prescription medication.  ==================================================================== Test                      Result    Flag   Units      Ref Range   Creatinine              76               mg/dL      >=20 ==================================================================== Declared Medications:  The flagging and interpretation on this report are based on the  following declared medications.  Unexpected results may arise from  inaccuracies in the declared medications.   **Note: The testing scope of this panel includes these medications:   Oxycodone (Roxicodone)   **Note: The testing scope of this panel does not include the  following reported medications:   Albuterol (Ventolin HFA)  Amlodipine (Norvasc)   Azelastine  Eye Drop  Ezetimibe (Zetia)  Fluticasone (Flonase)  Gabapentin (Neurontin)  Hydrochlorothiazide (Hydrodiuril)  Levocetirizine (Xyzal)  Levothyroxine (Synthroid)  Magnesium  Meloxicam (Mobic)  Metformin (Glucophage)  Olopatadine  Tizanidine (Zanaflex)  Vitamin D3 ==================================================================== For clinical consultation, please call [phone]. ====================================================================     Laboratory Chemistry Profile   Renal Lab Results  Component Value Date   BUN 8 04/16/2020   CREATININE 0.88 04/16/2020   BCR 8 (L) 01/24/2018   GFR 81.11 04/16/2020   GFRAA >60 05/25/2018   GFRNONAA >60 05/25/2018     Hepatic Lab Results  Component Value Date   AST 64 (H) 04/16/2020   ALT 66 (H) 04/16/2020   ALBUMIN 4.5 04/16/2020   ALKPHOS 64 04/16/2020   LIPASE 25 05/25/2018     Electrolytes Lab Results  Component Value Date   NA 141 04/16/2020   K 3.6 04/16/2020   CL 100 04/16/2020   CALCIUM 10.0 04/16/2020   MG 2.0 01/24/2018  Bone Lab Results  Component Value Date   VD25OH 41.99 04/16/2020   25OHVITD1 9.3 (L) 01/24/2018   25OHVITD2 <1.0 01/24/2018   25OHVITD3 9.3 01/24/2018     Inflammation (CRP: Acute Phase) (ESR: Chronic Phase) Lab Results  Component Value Date   CRP 5.5 (H) 01/24/2018   ESRSEDRATE 40 01/24/2018       Note: Above Lab results reviewed.  Imaging  MM 3D SCREEN BREAST BILATERAL CLINICAL DATA:  Screening.  EXAM: DIGITAL SCREENING BILATERAL MAMMOGRAM WITH TOMO AND CAD  COMPARISON:  None.  ACR Breast Density Category a: The breast tissue is almost entirely fatty.  FINDINGS: There are no findings suspicious for malignancy. Images were processed with CAD.  IMPRESSION: No mammographic evidence of malignancy. A result letter of this screening mammogram will be mailed directly to the patient.  RECOMMENDATION: Screening mammogram in one year.  (Code:SM-B-01Y)  BI-RADS CATEGORY  1: Negative.  Electronically Signed   By: Audie Pinto M.D.   On: 11/10/2019 10:40  Assessment  The primary encounter diagnosis was Chronic pain syndrome. Diagnoses of Chronic low back pain (1ry area of Pain) (Bilateral) (L>R) w/o sciatica, Chronic lower extremity pain (2ry area of Pain) (Bilateral) (L>R), Chronic neck pain (3ry area of Pain) (Bilateral) (L>R), Chronic upper extremity pain (4th area of Pain) (Bilateral) (L>R), Chronic hip pain (Bilateral) (R>L), Chronic knee pain (Bilateral) (R>L), DDD (degenerative disc disease), cervical, DDD (degenerative disc disease), thoracic, DDD (degenerative disc disease), lumbosacral, Pharmacologic therapy, Chronic use of opiate for therapeutic purpose, and Uncomplicated opioid dependence ([place]) were also pertinent to this visit.  Plan of Care  Problem-specific:  No problem-specific Assessment & Plan notes found for this encounter.  Ms. BLAYKE PINERA has a current medication list which includes the following long-term medication(s): albuterol, amlodipine, ezetimibe, fluticasone, hydrochlorothiazide, levocetirizine, levothyroxine, magnesium, metformin, omeprazole, gabapentin, meloxicam, [START ON 02/09/2021] oxycodone, and tizanidine.  Pharmacotherapy (Medications Ordered): Meds ordered this encounter  Medications   oxyCODONE (OXY IR/ROXICODONE) 5 MG immediate release tablet    Sig: Take 1 tablet (5 mg total) by mouth every 8 (eight) hours as needed for severe pain. Must last 30 days    Dispense:  90 tablet    Refill:  0    Not a duplicate. Do NOT delete! Dispense 1 day early if closed on refill date. Avoid benzodiazepines within 8 hours of opioids. Do not send refill requests.    Orders:  No orders of the defined types were placed in this encounter.  Follow-up plan:   Return in about 6 weeks (around 03/11/2021) for evaluation day (F2F) (MM).     Interventional management options:  Considering:    NOTE: NO RFA until BMI <35  Possible bilateral lumbar facet RFA  Diagnostic left-sided sacroiliac joint block  Possible left-sided sacroiliac joint RFA  Diagnostic bilateral IA hip joint injection  Diagnostic bilateral femoral nerve + obturator NB  Possible bilateral femoral nerve + obturator nerve RFA  Diagnostic left-sided LESI  Diagnostic bilateral transforaminal ESI  Diagnostic left-sided CESI  Diagnostic bilateral cervical facet block  Possible bilateral cervical facet RFA  Diagnostic bilateral IA knee joint injections with local anesthetic and steroid  Possible series of 5 bilateral intra-articular Hyalgan knee injections  Diagnostic bilateral genicular nerve blocks  Possible bilateral genicular nerve RFA  Diagnostic trigger point injections    Palliative PRN treatment(s):   Palliative bilateral lumbar facet block        Recent Visits Date Type Provider Dept  11/08/20 Office Visit Milinda Pointer, MD [facility]  Showing recent visits within past 90 days and meeting all other requirements Today's Visits Date Type Provider Dept  01/31/21 Telemedicine Milinda Pointer, MD [facility]  Showing today's visits and meeting all other requirements Future Appointments Date Type Provider Dept  03/09/21 Appointment Milinda Pointer, MD [facility]  Showing future appointments within next 90 days and meeting all other requirements I discussed the assessment and treatment plan with the patient. The patient was provided an opportunity to ask questions and all were answered. The patient agreed with the plan and demonstrated an understanding of the instructions.  Patient advised to call back or seek an in-person evaluation if the symptoms or condition worsens.  Duration of encounter: 12 minutes.  Note by: Gaspar Cola, MD Date: 01/31/2021; Time: 4:14 PM

## 2021-01-31 ENCOUNTER — Ambulatory Visit: Payer: Medicare HMO | Attending: Pain Medicine | Admitting: Pain Medicine

## 2021-01-31 ENCOUNTER — Other Ambulatory Visit: Payer: Self-pay

## 2021-01-31 DIAGNOSIS — M542 Cervicalgia: Secondary | ICD-10-CM

## 2021-01-31 DIAGNOSIS — G894 Chronic pain syndrome: Secondary | ICD-10-CM

## 2021-01-31 DIAGNOSIS — M25552 Pain in left hip: Secondary | ICD-10-CM

## 2021-01-31 DIAGNOSIS — M5030 Other cervical disc degeneration, unspecified cervical region: Secondary | ICD-10-CM

## 2021-01-31 DIAGNOSIS — G8929 Other chronic pain: Secondary | ICD-10-CM

## 2021-01-31 DIAGNOSIS — Z79891 Long term (current) use of opiate analgesic: Secondary | ICD-10-CM

## 2021-01-31 DIAGNOSIS — F1120 Opioid dependence, uncomplicated: Secondary | ICD-10-CM

## 2021-01-31 DIAGNOSIS — M79604 Pain in right leg: Secondary | ICD-10-CM

## 2021-01-31 DIAGNOSIS — M79602 Pain in left arm: Secondary | ICD-10-CM

## 2021-01-31 DIAGNOSIS — M5137 Other intervertebral disc degeneration, lumbosacral region: Secondary | ICD-10-CM

## 2021-01-31 DIAGNOSIS — M25561 Pain in right knee: Secondary | ICD-10-CM

## 2021-01-31 DIAGNOSIS — M5134 Other intervertebral disc degeneration, thoracic region: Secondary | ICD-10-CM

## 2021-01-31 DIAGNOSIS — M25551 Pain in right hip: Secondary | ICD-10-CM

## 2021-01-31 DIAGNOSIS — M545 Low back pain, unspecified: Secondary | ICD-10-CM | POA: Diagnosis not present

## 2021-01-31 DIAGNOSIS — M79605 Pain in left leg: Secondary | ICD-10-CM

## 2021-01-31 DIAGNOSIS — Z79899 Other long term (current) drug therapy: Secondary | ICD-10-CM

## 2021-01-31 DIAGNOSIS — M79601 Pain in right arm: Secondary | ICD-10-CM

## 2021-01-31 DIAGNOSIS — M25562 Pain in left knee: Secondary | ICD-10-CM

## 2021-01-31 MED ORDER — OXYCODONE HCL 5 MG PO TABS: 5.0000 mg | ORAL_TABLET | Freq: Three times a day (TID) | ORAL | 0 refills | Status: DC | PRN

## 2021-02-07 ENCOUNTER — Encounter: Payer: Medicare HMO | Admitting: Pain Medicine

## 2021-02-09 ENCOUNTER — Telehealth: Payer: Self-pay | Admitting: Family Medicine

## 2021-02-09 DIAGNOSIS — I10 Essential (primary) hypertension: Secondary | ICD-10-CM

## 2021-02-09 NOTE — Telephone Encounter (Signed)
  LAST APPOINTMENT DATE: 01/21/2021   NEXT APPOINTMENT DATE:@Visit  date not found  MEDICATION: amlodipine  [facility] [place]   Let patient know to contact pharmacy at the end of the day to make sure medication is ready.  Please notify patient to allow 48-72 hours to process  Encourage patient to contact the pharmacy for refills or they can request refills through [place]:   LAST REFILL:  QTY:  REFILL DATE:    OTHER COMMENTS:    Okay for refill?  Please advise

## 2021-02-10 MED ORDER — AMLODIPINE BESYLATE 10 MG PO TABS: 10.0000 mg | ORAL_TABLET | Freq: Every day | ORAL | 1 refills | Status: DC

## 2021-02-10 MED ORDER — EZETIMIBE 10 MG PO TABS: 10.0000 mg | ORAL_TABLET | Freq: Every day | ORAL | 1 refills | Status: DC

## 2021-02-10 NOTE — Telephone Encounter (Signed)
Refills sent as requested

## 2021-02-10 NOTE — Addendum Note (Signed)
Addended by: Carter Kitten on: 02/10/2021 02:07 PM   Modules accepted: Orders

## 2021-02-22 ENCOUNTER — Other Ambulatory Visit: Payer: Self-pay | Admitting: Family Medicine

## 2021-02-22 DIAGNOSIS — I10 Essential (primary) hypertension: Secondary | ICD-10-CM

## 2021-03-07 NOTE — Progress Notes (Signed)
PROVIDER NOTE: Information contained herein reflects review and annotations entered in association with encounter. Interpretation of such information and data should be left to medically-trained personnel. Information provided to patient can be located elsewhere in the medical record under "Patient Instructions". Document created using STT-dictation technology, any transcriptional errors that may result from process are unintentional.    Patient: Teresa Mcgee  Service Category: E/M  Provider: Gaspar Cola, MD  DOB: 1966/01/11  DOS: 03/09/2021  Specialty: Interventional Pain Management  MRN: [id number]  Setting: Ambulatory outpatient  PCP: Abner Greenspan, MD  Type: Established Patient    Referring Provider: Abner Greenspan, MD  Location: Office  Delivery: Face-to-face     HPI  [name], a 55 y.o. year old female, is here today because of her Chronic pain syndrome [G89.4]. Ms. Savoia primary complain today is Back Pain (Lumbar bilateral ) and Neck Pain (Bilateral ) Last encounter: My last encounter with her was on 11/08/2020. Pertinent problems: Ms. Calbert has Muscle spasms of neck; Numbness and tingling in both hands; Chronic low back pain (Bilateral) (L>R) w/ sciatica (Bilateral); Chronic lower extremity pain (2ry area of Pain) (Bilateral) (L>R); Chronic neck pain (3ry area of Pain) (Bilateral) (L>R); Chronic pain syndrome; Chronic upper extremity pain (4th area of Pain) (Bilateral) (L>R); Cervicalgia; Chronic musculoskeletal pain; DDD (degenerative disc disease), thoracic; DDD (degenerative disc disease), cervical; Lumbar facet arthropathy (Bilateral); Lumbar facet syndrome (Bilateral); Chronic low back pain (1ry area of Pain) (Bilateral) (L>R) w/o sciatica; Spondylosis without myelopathy or radiculopathy, cervical region; Spondylosis without myelopathy or radiculopathy, lumbosacral region; DDD (degenerative disc disease), lumbosacral; Strain of lumbar paraspinal muscle, sequela;  Chronic hip pain (Bilateral) (R>L); Chronic sacroiliac joint pain (Left); Neurogenic pain; Osteoarthritis involving multiple joints; and Chronic knee pain (Bilateral) (R>L) on their pertinent problem list. Pain Assessment: Severity of Chronic pain is reported as a  /10. Location: Back (neck bilateral) Lower, Left, Right/down both arms, back pain into hips and legs. Onset: More than a month ago. Quality: Numbness, Tingling, Discomfort, Constant. Timing: Constant. Modifying factor(s): rest, medications takes the edge off.. Vitals:  height is '5\' 3"'  (1.6 m) and weight is 246 lb (111.6 kg). Her temporal temperature is 97.1 F (36.2 C) (abnormal). Her blood pressure is 137/93 (abnormal) and her pulse is 73. Her respiration is 16 and oxygen saturation is 100%.   Reason for encounter: medication management.   The patient indicates doing well with the current medication regimen. No adverse reactions or side effects reported to the medications.   RTCB: 06/09/2021 Nonopioids transfer 08/11/2020: Gabapentin, meloxicam, and Zanaflex  Pharmacotherapy Assessment  Analgesic: Oxycodone IR 5 mg, 1 tab PO q 8 hrs (PRN) (15 mg/day of oxycodone) MME/day: 22.5 mg/day.   Monitoring: [place] PMP: PDMP reviewed during this encounter.       Pharmacotherapy: No side-effects or adverse reactions reported. Compliance: No problems identified. Effectiveness: Clinically acceptable.  Janett Billow, RN  03/09/2021  8:13 AM  Sign when Signing Visit Nursing Pain Medication Assessment:  Safety precautions to be maintained throughout the outpatient stay will include: orient to surroundings, keep bed in low position, maintain call bell within reach at all times, provide assistance with transfer out of bed and ambulation.  Medication Inspection Compliance: Pill count conducted under aseptic conditions, in front of the patient. Neither the pills nor the bottle was removed from the patient's sight at any time. Once count was  completed pills were immediately returned to the patient in their original bottle.  Medication:  Oxycodone IR Pill/Patch Count:  5 of 90 pills remain Pill/Patch Appearance: Markings consistent with prescribed medication Bottle Appearance: Standard pharmacy container. Clearly labeled. Filled Date: 06 / 29 / 2022 Last Medication intake:  Today    UDS:  Summary  Date Value Ref Range Status  02/11/2020 Note  Final    Comment:    ==================================================================== [employer] Select 13 (MW) ==================================================================== Test                             Result       Flag       Units  Drug Present and Declared for Prescription Verification   Oxycodone                      142          EXPECTED   ng/mg creat   Oxymorphone                    603          EXPECTED   ng/mg creat   Noroxycodone                   220          EXPECTED   ng/mg creat   Noroxymorphone                 84           EXPECTED   ng/mg creat    Sources of oxycodone are scheduled prescription medications.    Oxymorphone, noroxycodone, and noroxymorphone are expected    metabolites of oxycodone. Oxymorphone is also available as a    scheduled prescription medication.  ==================================================================== Test                      Result    Flag   Units      Ref Range   Creatinine              76               mg/dL      >=20 ==================================================================== Declared Medications:  The flagging and interpretation on this report are based on the  following declared medications.  Unexpected results may arise from  inaccuracies in the declared medications.   **Note: The testing scope of this panel includes these medications:   Oxycodone (Roxicodone)   **Note: The testing scope of this panel does not include the  following reported medications:   Albuterol (Ventolin HFA)  Amlodipine  (Norvasc)  Azelastine  Eye Drop  Ezetimibe (Zetia)  Fluticasone (Flonase)  Gabapentin (Neurontin)  Hydrochlorothiazide (Hydrodiuril)  Levocetirizine (Xyzal)  Levothyroxine (Synthroid)  Magnesium  Meloxicam (Mobic)  Metformin (Glucophage)  Olopatadine  Tizanidine (Zanaflex)  Vitamin D3 ==================================================================== For clinical consultation, please call [phone]. ====================================================================      ROS  Constitutional: Denies any fever or chills Gastrointestinal: No reported hemesis, hematochezia, vomiting, or acute GI distress Musculoskeletal: Denies any acute onset joint swelling, redness, loss of ROM, or weakness Neurological: No reported episodes of acute onset apraxia, aphasia, dysarthria, agnosia, amnesia, paralysis, loss of coordination, or loss of consciousness  Medication Review  Accu-Chek FastClix Lancets, Bempedoic Acid, Carboxymethylcellulose Sod PF, Cholecalciferol, Magnesium, Olopatadine HCl, albuterol, amLODipine, azelastine, blood glucose meter kit and supplies, ezetimibe, fluticasone, gabapentin, glucose blood, hydrochlorothiazide, levocetirizine, levothyroxine, meloxicam, metFORMIN, omeprazole, oxyCODONE, and tiZANidine  History Review  Allergy:  Ms. Pomerleau has No Known Allergies. Drug: Ms. Denson  reports no history of drug use. Alcohol:  reports no history of alcohol use. Tobacco:  reports that she has been smoking cigarettes. She has a 5.00 pack-year smoking history. She has never used smokeless tobacco. Social: Ms. Shark  reports that she has been smoking cigarettes. She has a 5.00 pack-year smoking history. She has never used smokeless tobacco. She reports that she does not drink alcohol and does not use drugs. Medical:  has a past medical history of Arthritis, Bell's palsy (may 2012), Chest pain, Diabetes mellitus without complication ([place]), GERD (gastroesophageal reflux  disease), H/O hiatal hernia, Hyperlipidemia, Hypertension, Hypothyroidism, Shingles, Shortness of breath, and Sleep apnea. Surgical: Ms. Simmering  has a past surgical history that includes Thyroidectomy (1998); cesarian; Partial hysterectomy (2000); Cesarean section; Tubal ligation; Tooth extraction; Abdominal hysterectomy; Colonoscopy with propofol (N/A, 11/27/2019); and Esophagogastroduodenoscopy (egd) with propofol (N/A, 07/20/2020). Family: family history includes Breast cancer in her maternal aunt; Diabetes in her brother and sister; Heart attack (age of onset: 52) in her brother; Heart attack (age of onset: 50) in her sister; Heart attack (age of onset: 37) in her father; Stroke (age of onset: 15) in her brother; Stroke (age of onset: 33) in her sister.  Laboratory Chemistry Profile   Renal Lab Results  Component Value Date   BUN 8 04/16/2020   CREATININE 0.88 04/16/2020   BCR 8 (L) 01/24/2018   GFR 81.11 04/16/2020   GFRAA >60 05/25/2018   GFRNONAA >60 05/25/2018    Hepatic Lab Results  Component Value Date   AST 64 (H) 04/16/2020   ALT 66 (H) 04/16/2020   ALBUMIN 4.5 04/16/2020   ALKPHOS 64 04/16/2020   LIPASE 25 05/25/2018    Electrolytes Lab Results  Component Value Date   NA 141 04/16/2020   K 3.6 04/16/2020   CL 100 04/16/2020   CALCIUM 10.0 04/16/2020   MG 2.0 01/24/2018    Bone Lab Results  Component Value Date   VD25OH 41.99 04/16/2020   25OHVITD1 9.3 (L) 01/24/2018   25OHVITD2 <1.0 01/24/2018   25OHVITD3 9.3 01/24/2018    Inflammation (CRP: Acute Phase) (ESR: Chronic Phase) Lab Results  Component Value Date   CRP 5.5 (H) 01/24/2018   ESRSEDRATE 40 01/24/2018         Note: Above Lab results reviewed.  Recent Imaging Review  MM 3D SCREEN BREAST BILATERAL CLINICAL DATA:  Screening.  EXAM: DIGITAL SCREENING BILATERAL MAMMOGRAM WITH TOMO AND CAD  COMPARISON:  None.  ACR Breast Density Category a: The breast tissue is almost  entirely fatty.  FINDINGS: There are no findings suspicious for malignancy. Images were processed with CAD.  IMPRESSION: No mammographic evidence of malignancy. A result letter of this screening mammogram will be mailed directly to the patient.  RECOMMENDATION: Screening mammogram in one year. (Code:SM-B-01Y)  BI-RADS CATEGORY  1: Negative.  Electronically Signed   By: Audie Pinto M.D.   On: 11/10/2019 10:40 Note: Reviewed        Physical Exam  General appearance: Well nourished, well developed, and well hydrated. In no apparent acute distress Mental status: Alert, oriented x 3 (person, place, & time)       Respiratory: No evidence of acute respiratory distress Eyes: PERLA Vitals: BP (!) 137/93 (BP Location: Right Arm, Patient Position: Sitting, Cuff Size: Large)   Pulse 73   Temp (!) 97.1 F (36.2 C) (Temporal)   Resp 16   Ht '5\' 3"'  (1.6 m)  Wt 246 lb (111.6 kg)   LMP  (LMP Unknown)   SpO2 100%   BMI 43.58 kg/m  BMI: Estimated body mass index is 43.58 kg/m as calculated from the following:   Height as of this encounter: '5\' 3"'  (1.6 m).   Weight as of this encounter: 246 lb (111.6 kg). Ideal: Ideal body weight: 52.4 kg (115 lb 8.3 oz) Adjusted ideal body weight: 76.1 kg (167 lb 11.4 oz)  Assessment   Status Diagnosis  Controlled Controlled Controlled 1. Chronic pain syndrome   2. Pharmacologic therapy   3. Chronic use of opiate for therapeutic purpose   4. Encounter for medication management   5. Chronic low back pain (1ry area of Pain) (Bilateral) (L>R) w/o sciatica   6. Chronic lower extremity pain (2ry area of Pain) (Bilateral) (L>R)   7. Chronic neck pain (3ry area of Pain) (Bilateral) (L>R)   8. Chronic upper extremity pain (4th area of Pain) (Bilateral) (L>R)   9. Chronic hip pain (Bilateral) (R>L)   10. Chronic knee pain (Bilateral) (R>L)   11. Uncomplicated opioid dependence ([place])      Updated Problems: No problems updated.  Plan of Care   Problem-specific:  No problem-specific Assessment & Plan notes found for this encounter.  Ms. KENLIE SEKI has a current medication list which includes the following long-term medication(s): albuterol, amlodipine, ezetimibe, fluticasone, gabapentin, hydrochlorothiazide, levocetirizine, levothyroxine, magnesium, meloxicam, metformin, omeprazole, [START ON 03/11/2021] oxycodone, [START ON 04/10/2021] oxycodone, [START ON 05/10/2021] oxycodone, and tizanidine.  Pharmacotherapy (Medications Ordered): Meds ordered this encounter  Medications   oxyCODONE (OXY IR/ROXICODONE) 5 MG immediate release tablet    Sig: Take 1 tablet (5 mg total) by mouth every 8 (eight) hours as needed for severe pain. Must last 30 days    Dispense:  90 tablet    Refill:  0    Not a duplicate. Do NOT delete! Dispense 1 day early if closed on refill date. Avoid benzodiazepines within 8 hours of opioids. Do not send refill requests.   oxyCODONE (OXY IR/ROXICODONE) 5 MG immediate release tablet    Sig: Take 1 tablet (5 mg total) by mouth every 8 (eight) hours as needed for severe pain. Must last 30 days    Dispense:  90 tablet    Refill:  0    Not a duplicate. Do NOT delete! Dispense 1 day early if closed on refill date. Avoid benzodiazepines within 8 hours of opioids. Do not send refill requests.   oxyCODONE (OXY IR/ROXICODONE) 5 MG immediate release tablet    Sig: Take 1 tablet (5 mg total) by mouth every 8 (eight) hours as needed for severe pain. Must last 30 days    Dispense:  90 tablet    Refill:  0    Not a duplicate. Do NOT delete! Dispense 1 day early if closed on refill date. Avoid benzodiazepines within 8 hours of opioids. Do not send refill requests.    Orders:  Orders Placed This Encounter  Procedures   Tox[employer]SU[employer] Select 13 (MW), Urine    Volume: 30 ml(s). Minimum 3 ml of urine is needed. Document temperature of fresh sample. Indications: Long term (current) use of opiate analgesic (C16.606)    Order  Specific Question:   Release to patient    Answer:   Immediate    Follow-up plan:   Return in about 3 months (around 06/09/2021) for evaluation day (F2F) (MM).     Interventional management options:  Considering:   NOTE: NO RFA until  BMI <35  Possible bilateral lumbar facet RFA  Diagnostic left-sided sacroiliac joint block  Possible left-sided sacroiliac joint RFA  Diagnostic bilateral IA hip joint injection  Diagnostic bilateral femoral nerve + obturator NB  Possible bilateral femoral nerve + obturator nerve RFA  Diagnostic left-sided LESI  Diagnostic bilateral transforaminal ESI  Diagnostic left-sided CESI  Diagnostic bilateral cervical facet block  Possible bilateral cervical facet RFA  Diagnostic bilateral IA knee joint injections with local anesthetic and steroid  Possible series of 5 bilateral intra-articular Hyalgan knee injections  Diagnostic bilateral genicular nerve blocks  Possible bilateral genicular nerve RFA  Diagnostic trigger point injections    Palliative PRN treatment(s):   Palliative bilateral lumbar facet block     Recent Visits Date Type Provider Dept  01/31/21 Telemedicine Milinda Pointer, MD [facility]  Showing recent visits within past 90 days and meeting all other requirements Today's Visits Date Type Provider Dept  03/09/21 Office Visit Milinda Pointer, MD [facility]  Showing today's visits and meeting all other requirements Future Appointments No visits were found meeting these conditions. Showing future appointments within next 90 days and meeting all other requirements I discussed the assessment and treatment plan with the patient. The patient was provided an opportunity to ask questions and all were answered. The patient agreed with the plan and demonstrated an understanding of the instructions.  Patient advised to call back or seek an in-person evaluation if the symptoms or condition worsens.  Duration of  encounter: 30 minutes.  Note by: Gaspar Cola, MD Date: 03/09/2021; Time: 8:39 AM

## 2021-03-09 ENCOUNTER — Other Ambulatory Visit: Payer: Self-pay

## 2021-03-09 ENCOUNTER — Ambulatory Visit: Payer: Medicare HMO | Attending: Pain Medicine | Admitting: Pain Medicine

## 2021-03-09 ENCOUNTER — Encounter: Payer: Self-pay | Admitting: Pain Medicine

## 2021-03-09 VITALS — BP 137/93 | HR 73 | Temp 97.1°F | Resp 16 | Ht 63.0 in | Wt 246.0 lb

## 2021-03-09 DIAGNOSIS — M25551 Pain in right hip: Secondary | ICD-10-CM | POA: Insufficient documentation

## 2021-03-09 DIAGNOSIS — M79605 Pain in left leg: Secondary | ICD-10-CM

## 2021-03-09 DIAGNOSIS — M25562 Pain in left knee: Secondary | ICD-10-CM | POA: Insufficient documentation

## 2021-03-09 DIAGNOSIS — Z79891 Long term (current) use of opiate analgesic: Secondary | ICD-10-CM | POA: Insufficient documentation

## 2021-03-09 DIAGNOSIS — F1120 Opioid dependence, uncomplicated: Secondary | ICD-10-CM | POA: Insufficient documentation

## 2021-03-09 DIAGNOSIS — M545 Low back pain, unspecified: Secondary | ICD-10-CM

## 2021-03-09 DIAGNOSIS — G894 Chronic pain syndrome: Secondary | ICD-10-CM

## 2021-03-09 DIAGNOSIS — M79604 Pain in right leg: Secondary | ICD-10-CM | POA: Diagnosis present

## 2021-03-09 DIAGNOSIS — M79601 Pain in right arm: Secondary | ICD-10-CM | POA: Diagnosis present

## 2021-03-09 DIAGNOSIS — G8929 Other chronic pain: Secondary | ICD-10-CM | POA: Insufficient documentation

## 2021-03-09 DIAGNOSIS — M25561 Pain in right knee: Secondary | ICD-10-CM | POA: Diagnosis present

## 2021-03-09 DIAGNOSIS — M25552 Pain in left hip: Secondary | ICD-10-CM | POA: Diagnosis present

## 2021-03-09 DIAGNOSIS — M79602 Pain in left arm: Secondary | ICD-10-CM | POA: Diagnosis present

## 2021-03-09 DIAGNOSIS — Z79899 Other long term (current) drug therapy: Secondary | ICD-10-CM | POA: Diagnosis present

## 2021-03-09 DIAGNOSIS — M542 Cervicalgia: Secondary | ICD-10-CM

## 2021-03-09 MED ORDER — OXYCODONE HCL 5 MG PO TABS: 5.0000 mg | ORAL_TABLET | Freq: Three times a day (TID) | ORAL | 0 refills | Status: DC | PRN

## 2021-03-09 NOTE — Progress Notes (Signed)
Nursing Pain Medication Assessment:  Safety precautions to be maintained throughout the outpatient stay will include: orient to surroundings, keep bed in low position, maintain call bell within reach at all times, provide assistance with transfer out of bed and ambulation.  Medication Inspection Compliance: Pill count conducted under aseptic conditions, in front of the patient. Neither the pills nor the bottle was removed from the patient's sight at any time. Once count was completed pills were immediately returned to the patient in their original bottle.  Medication: Oxycodone IR Pill/Patch Count:  5 of 90 pills remain Pill/Patch Appearance: Markings consistent with prescribed medication Bottle Appearance: Standard pharmacy container. Clearly labeled. Filled Date: 06 / 29 / 2022 Last Medication intake:  Today

## 2021-03-09 NOTE — Patient Instructions (Signed)
____________________________________________________________________________________________  Medication Rules  Purpose: To inform patients, and their family members, of our rules and regulations.  Applies to: All patients receiving prescriptions (written or electronic).  Pharmacy of record: Pharmacy where electronic prescriptions will be sent. If written prescriptions are taken to a different pharmacy, please inform the nursing staff. The pharmacy listed in the electronic medical record should be the one where you would like electronic prescriptions to be sent.  Electronic prescriptions: In compliance with the [place] Strengthen Opioid Misuse Prevention (STOP) Act of 2017 (Session Law 2017-74/H243), effective August 14, 2018, all controlled substances must be electronically prescribed. Calling prescriptions to the pharmacy will cease to exist.  Prescription refills: Only during scheduled appointments. Applies to all prescriptions.  NOTE: The following applies primarily to controlled substances (Opioid* Pain Medications).   Type of encounter (visit): For patients receiving controlled substances, face-to-face visits are required. (Not an option or up to the patient.)  Patient's responsibilities: Pain Pills: Bring all pain pills to every appointment (except for procedure appointments). Pill Bottles: Bring pills in original pharmacy bottle. Always bring the newest bottle. Bring bottle, even if empty. Medication refills: You are responsible for knowing and keeping track of what medications you take and those you need refilled. The day before your appointment: write a list of all prescriptions that need to be refilled. The day of the appointment: give the list to the admitting nurse. Prescriptions will be written only during appointments. No prescriptions will be written on procedure days. If you forget a medication: it will not be "Called in", "Faxed", or "electronically sent". You will  need to get another appointment to get these prescribed. No early refills. Do not call asking to have your prescription filled early. Prescription Accuracy: You are responsible for carefully inspecting your prescriptions before leaving our office. Have the discharge nurse carefully go over each prescription with you, before taking them home. Make sure that your name is accurately spelled, that your address is correct. Check the name and dose of your medication to make sure it is accurate. Check the number of pills, and the written instructions to make sure they are clear and accurate. Make sure that you are given enough medication to last until your next medication refill appointment. Taking Medication: Take medication as prescribed. When it comes to controlled substances, taking less pills or less frequently than prescribed is permitted and encouraged. Never take more pills than instructed. Never take medication more frequently than prescribed.  Inform other Doctors: Always inform, all of your healthcare providers, of all the medications you take. Pain Medication from other Providers: You are not allowed to accept any additional pain medication from any other Doctor or Healthcare provider. There are two exceptions to this rule. (see below) In the event that you require additional pain medication, you are responsible for notifying us, as stated below. Cough Medicine: Often these contain an opioid, such as codeine or hydrocodone. Never accept or take cough medicine containing these opioids if you are already taking an opioid* medication. The combination may cause respiratory failure and death. Medication Agreement: You are responsible for carefully reading and following our Medication Agreement. This must be signed before receiving any prescriptions from our practice. Safely store a copy of your signed Agreement. Violations to the Agreement will result in no further prescriptions. (Additional copies of our  Medication Agreement are available upon request.) Laws, Rules, & Regulations: All patients are expected to follow all Federal and State Laws, Statutes, Rules, & Regulations. Ignorance of   the Laws does not constitute a valid excuse.  Illegal drugs and Controlled Substances: The use of illegal substances (including, but not limited to marijuana and its derivatives) and/or the illegal use of any controlled substances is strictly prohibited. Violation of this rule may result in the immediate and permanent discontinuation of any and all prescriptions being written by our practice. The use of any illegal substances is prohibited. Adopted CDC guidelines & recommendations: Target dosing levels will be at or below 60 MME/day. Use of benzodiazepines** is not recommended.  Exceptions: There are only two exceptions to the rule of not receiving pain medications from other Healthcare Providers. Exception #1 (Emergencies): In the event of an emergency (i.e.: accident requiring emergency care), you are allowed to receive additional pain medication. However, you are responsible for: As soon as you are able, call our office [phone], at any time of the day or night, and leave a message stating your name, the date and nature of the emergency, and the name and dose of the medication prescribed. In the event that your call is answered by a member of our staff, make sure to document and save the date, time, and the name of the person that took your information.  Exception #2 (Planned Surgery): In the event that you are scheduled by another doctor or dentist to have any type of surgery or procedure, you are allowed (for a period no longer than 30 days), to receive additional pain medication, for the acute post-op pain. However, in this case, you are responsible for picking up a copy of our "Post-op Pain Management for Surgeons" handout, and giving it to your surgeon or dentist. This document is available at our office, and  does not require an appointment to obtain it. Simply go to our office during business hours (Monday-Thursday from 8:00 AM to 4:00 PM) (Friday 8:00 AM to 12:00 Noon) or if you have a scheduled appointment with us, prior to your surgery, and ask for it by name. In addition, you are responsible for: calling our office [phone], at any time of the day or night, and leaving a message stating your name, name of your surgeon, type of surgery, and date of procedure or surgery. Failure to comply with your responsibilities may result in termination of therapy involving the controlled substances.  *Opioid medications include: morphine, codeine, oxycodone, oxymorphone, hydrocodone, hydromorphone, meperidine, tramadol, tapentadol, buprenorphine, fentanyl, methadone. **Benzodiazepine medications include: diazepam (Valium), alprazolam (Xanax), clonazepam (Klonopine), lorazepam (Ativan), clorazepate (Tranxene), chlordiazepoxide (Librium), estazolam (Prosom), oxazepam (Serax), temazepam (Restoril), triazolam (Halcion) (Last updated: 07/12/2020) ____________________________________________________________________________________________  ____________________________________________________________________________________________  Medication Recommendations and Reminders  Applies to: All patients receiving prescriptions (written and/or electronic).  Medication Rules & Regulations: These rules and regulations exist for your safety and that of others. They are not flexible and neither are we. Dismissing or ignoring them will be considered "non-compliance" with medication therapy, resulting in complete and irreversible termination of such therapy. (See document titled "Medication Rules" for more details.) In all conscience, because of safety reasons, we cannot continue providing a therapy where the patient does not follow instructions.  Pharmacy of record:  Definition: This is the pharmacy where your electronic  prescriptions will be sent.  We do not endorse any particular pharmacy, however, we have experienced problems with [facility] not securing enough medication supply for the community. We do not restrict you in your choice of pharmacy. However, once we write for your prescriptions, we will NOT be re-sending more prescriptions to fix restricted supply problems   created by your pharmacy, or your insurance.  The pharmacy listed in the electronic medical record should be the one where you want electronic prescriptions to be sent. If you choose to change pharmacy, simply notify our nursing staff.  Recommendations: Keep all of your pain medications in a safe place, under lock and key, even if you live alone. We will NOT replace lost, stolen, or damaged medication. After you fill your prescription, take 1 week's worth of pills and put them away in a safe place. You should keep a separate, properly labeled bottle for this purpose. The remainder should be kept in the original bottle. Use this as your primary supply, until it runs out. Once it's gone, then you know that you have 1 week's worth of medicine, and it is time to come in for a prescription refill. If you do this correctly, it is unlikely that you will ever run out of medicine. To make sure that the above recommendation works, it is very important that you make sure your medication refill appointments are scheduled at least 1 week before you run out of medicine. To do this in an effective manner, make sure that you do not leave the office without scheduling your next medication management appointment. Always ask the nursing staff to show you in your prescription , when your medication will be running out. Then arrange for the receptionist to get you a return appointment, at least 7 days before you run out of medicine. Do not wait until you have 1 or 2 pills left, to come in. This is very poor planning and does not take into consideration that we may need to  cancel appointments due to bad weather, sickness, or emergencies affecting our staff. DO NOT ACCEPT A "Partial Fill": If for any reason your pharmacy does not have enough pills/tablets to completely fill or refill your prescription, do not allow for a "partial fill". The law allows the pharmacy to complete that prescription within 72 hours, without requiring a new prescription. If they do not fill the rest of your prescription within those 72 hours, you will need a separate prescription to fill the remaining amount, which we will NOT provide. If the reason for the partial fill is your insurance, you will need to talk to the pharmacist about payment alternatives for the remaining tablets, but again, DO NOT ACCEPT A PARTIAL FILL, unless you can trust your pharmacist to obtain the remainder of the pills within 72 hours.  Prescription refills and/or changes in medication(s):  Prescription refills, and/or changes in dose or medication, will be conducted only during scheduled medication management appointments. (Applies to both, written and electronic prescriptions.) No refills on procedure days. No medication will be changed or started on procedure days. No changes, adjustments, and/or refills will be conducted on a procedure day. Doing so will interfere with the diagnostic portion of the procedure. No phone refills. No medications will be "called into the pharmacy". No Fax refills. No weekend refills. No Holliday refills. No after hours refills.  Remember:  Business hours are:  Monday to Thursday 8:00 AM to 4:00 PM Provider's Schedule: Jessikah Dicker, MD - Appointments are:  Medication management: Monday and Wednesday 8:00 AM to 4:00 PM Procedure day: Tuesday and Thursday 7:30 AM to 4:00 PM Bilal Lateef, MD - Appointments are:  Medication management: Tuesday and Thursday 8:00 AM to 4:00 PM Procedure day: Monday and Wednesday 7:30 AM to 4:00 PM (Last update:  03/03/2020) ____________________________________________________________________________________________  ____________________________________________________________________________________________  CBD (cannabidiol) WARNING    Applicable to: All individuals currently taking or considering taking CBD (cannabidiol) and, more important, all patients taking opioid analgesic controlled substances (pain medication). (Example: oxycodone; oxymorphone; hydrocodone; hydromorphone; morphine; methadone; tramadol; tapentadol; fentanyl; buprenorphine; butorphanol; dextromethorphan; meperidine; codeine; etc.)  Legal status: CBD remains a Schedule I drug prohibited for any use. CBD is illegal with one exception. In the [place], CBD has a limited Food and Drug Administration (FDA) approval for the treatment of two specific types of epilepsy disorders. Only one CBD product has been approved by the FDA for this purpose: "Epidiolex". FDA is aware that some companies are marketing products containing cannabis and cannabis-derived compounds in ways that violate the Federal Food, Drug and Cosmetic Act (FD&C Act) and that may put the health and safety of consumers at risk. The FDA, a Federal agency, has not enforced the CBD status since 2018.   Legality: Some manufacturers ship CBD products nationally, which is illegal. Often such products are sold online and are therefore available throughout the country. CBD is openly sold in head shops and health food stores in some states where such sales have not been explicitly legalized. Selling unapproved products with unsubstantiated therapeutic claims is not only a violation of the law, but also can put patients at risk, as these products have not been proven to be safe or effective. Federal illegality makes it difficult to conduct research on CBD.  Reference: "FDA Regulation of Cannabis and Cannabis-Derived Products, Including Cannabidiol (CBD)" -  https://www.fda.gov/news-events/public-health-focus/fda-regulation-cannabis-and-cannabis-derived-products-including-cannabidiol-cbd  Warning: CBD is not FDA approved and has not undergo the same manufacturing controls as prescription drugs.  This means that the purity and safety of available CBD may be questionable. Most of the time, despite manufacturer's claims, it is contaminated with THC (delta-9-tetrahydrocannabinol - the chemical in marijuana responsible for the "HIGH").  When this is the case, the THC contaminant will trigger a positive urine drug screen (UDS) test for Marijuana (carboxy-THC). Because a positive UDS for any illicit substance is a violation of our medication agreement, your opioid analgesics (pain medicine) may be permanently discontinued.  MORE ABOUT CBD  General Information: CBD  is a derivative of the Marijuana (cannabis sativa) plant discovered in 1940. It is one of the 113 identified substances found in Marijuana. It accounts for up to 40% of the plant's extract. As of 2018, preliminary clinical studies on CBD included research for the treatment of anxiety, movement disorders, and pain. CBD is available and consumed in multiple forms, including inhalation of smoke or vapor, as an aerosol spray, and by mouth. It may be supplied as an oil containing CBD, capsules, dried cannabis, or as a liquid solution. CBD is thought not to be as psychoactive as THC (delta-9-tetrahydrocannabinol - the chemical in marijuana responsible for the "HIGH"). Studies suggest that CBD may interact with different biological target receptors in the body, including cannabinoid and other neurotransmitter receptors. As of 2018 the mechanism of action for its biological effects has not been determined.  Side-effects  Adverse reactions: Dry mouth, diarrhea, decreased appetite, fatigue, drowsiness, malaise, weakness, sleep disturbances, and others.  Drug interactions: CBC may interact with other medications  such as blood-thinners. (Last update: 03/20/2020) ____________________________________________________________________________________________  ____________________________________________________________________________________________  Drug Holidays (Slow)  What is a "Drug Holiday"? Drug Holiday: is the name given to the period of time during which a patient stops taking a medication(s) for the purpose of eliminating tolerance to the drug.  Benefits Improved effectiveness of opioids. Decreased opioid dose needed to achieve benefits. Improved pain with lesser dose.    What is tolerance? Tolerance: is the progressive decreased in effectiveness of a drug due to its repetitive use. With repetitive use, the body gets use to the medication and as a consequence, it loses its effectiveness. This is a common problem seen with opioid pain medications. As a result, a larger dose of the drug is needed to achieve the same effect that used to be obtained with a smaller dose.  How long should a "Drug Holiday" last? You should stay off of the pain medicine for at least 14 consecutive days. (2 weeks)  Should I stop the medicine "cold turkey"? No. You should always coordinate with your Pain Specialist so that he/she can provide you with the correct medication dose to make the transition as smoothly as possible.  How do I stop the medicine? Slowly. You will be instructed to decrease the daily amount of pills that you take by one (1) pill every seven (7) days. This is called a "slow downward taper" of your dose. For example: if you normally take four (4) pills per day, you will be asked to drop this dose to three (3) pills per day for seven (7) days, then to two (2) pills per day for seven (7) days, then to one (1) per day for seven (7) days, and at the end of those last seven (7) days, this is when the "Drug Holiday" would start.   Will I have withdrawals? By doing a "slow downward taper" like this one, it  is unlikely that you will experience any significant withdrawal symptoms. Typically, what triggers withdrawals is the sudden stop of a high dose opioid therapy. Withdrawals can usually be avoided by slowly decreasing the dose over a prolonged period of time. If you do not follow these instructions and decide to stop your medication abruptly, withdrawals may be possible.  What are withdrawals? Withdrawals: refers to the wide range of symptoms that occur after stopping or dramatically reducing opiate drugs after heavy and prolonged use. Withdrawal symptoms do not occur to patients that use low dose opioids, or those who take the medication sporadically. Contrary to benzodiazepine (example: Valium, Xanax, etc.) or alcohol withdrawals ("Delirium Tremens"), opioid withdrawals are not lethal. Withdrawals are the physical manifestation of the body getting rid of the excess receptors.  Expected Symptoms Early symptoms of withdrawal may include: Agitation Anxiety Muscle aches Increased tearing Insomnia Runny nose Sweating Yawning  Late symptoms of withdrawal may include: Abdominal cramping Diarrhea Dilated pupils Goose bumps Nausea Vomiting  Will I experience withdrawals? Due to the slow nature of the taper, it is very unlikely that you will experience any.  What is a slow taper? Taper: refers to the gradual decrease in dose.  (Last update: 03/03/2020) ____________________________________________________________________________________________    

## 2021-03-11 LAB — TOXASSURE SELECT 13 (MW), URINE

## 2021-03-18 ENCOUNTER — Other Ambulatory Visit: Payer: Self-pay | Admitting: *Deleted

## 2021-03-18 DIAGNOSIS — E1165 Type 2 diabetes mellitus with hyperglycemia: Secondary | ICD-10-CM

## 2021-03-18 MED ORDER — METFORMIN HCL 500 MG PO TABS: 500.0000 mg | ORAL_TABLET | Freq: Two times a day (BID) | ORAL | 0 refills | Status: DC

## 2021-03-18 NOTE — Telephone Encounter (Signed)
Med refilled once and will route to support to schedule appt

## 2021-03-18 NOTE — Telephone Encounter (Signed)
Fax refill request from [facility] for metformin. Last A1c on file was 10/15/19, TOC appt was on 10/03/20 and no future appts., please advise

## 2021-03-18 NOTE — Telephone Encounter (Signed)
Left voice message to call the office  

## 2021-03-18 NOTE — Telephone Encounter (Signed)
Please schedule annual exam in late sept and refill until then

## 2021-04-10 ENCOUNTER — Other Ambulatory Visit: Payer: Self-pay | Admitting: Pain Medicine

## 2021-04-10 ENCOUNTER — Other Ambulatory Visit: Payer: Self-pay | Admitting: Family Medicine

## 2021-04-10 DIAGNOSIS — M792 Neuralgia and neuritis, unspecified: Secondary | ICD-10-CM

## 2021-04-10 DIAGNOSIS — M8949 Other hypertrophic osteoarthropathy, multiple sites: Secondary | ICD-10-CM

## 2021-04-10 DIAGNOSIS — M159 Polyosteoarthritis, unspecified: Secondary | ICD-10-CM

## 2021-04-12 ENCOUNTER — Telehealth: Payer: Self-pay | Admitting: Family Medicine

## 2021-04-12 DIAGNOSIS — M792 Neuralgia and neuritis, unspecified: Secondary | ICD-10-CM

## 2021-04-12 MED ORDER — GABAPENTIN 100 MG PO CAPS: ORAL_CAPSULE | ORAL | 2 refills | Status: DC

## 2021-04-12 NOTE — Telephone Encounter (Signed)
Got it  Refilled  Thanks

## 2021-04-12 NOTE — Telephone Encounter (Signed)
Pt notified Rx sent 

## 2021-04-12 NOTE — Telephone Encounter (Signed)
  Encourage patient to contact the pharmacy for refills or they can request refills through [place]:  Please schedule appointment if longer than 1 year  NEXT APPOINTMENT DATE:  MEDICATION:gabapentin (NEURONTIN) 100 MG capsule (Expired  Is the patient out of medication?   [id number] [place], [place] [facility] [id number]  Let patient know to contact pharmacy at the end of the day to make sure medication is ready.  Please notify patient to allow 48-72 hours to process  CLINICAL FILLS OUT ALL BELOW:   LAST REFILL:  QTY:  REFILL DATE:    OTHER COMMENTS:    Okay for refill?  Please advise

## 2021-04-12 NOTE — Telephone Encounter (Signed)
See prev note. It looks like Dr. Dossie Arbour refills med (pain clinic), last filled by him on 08/11/20 #150 caps 2 refills

## 2021-04-12 NOTE — Telephone Encounter (Signed)
Pt said at her appt with PCP that they reviewed a letter from pain clinic doc saying that PCP needs to take over non-opoid meds. She said PCP agreed to fill meds going forward and told her to just let her know when she needs a refill. I found letter and printed it out for PCP to review. Please advise  [facility]

## 2021-04-12 NOTE — Telephone Encounter (Signed)
Thanks ,that needs to stay with the pain clinic then

## 2021-04-21 ENCOUNTER — Other Ambulatory Visit: Payer: Self-pay

## 2021-04-21 ENCOUNTER — Encounter: Payer: Self-pay | Admitting: Podiatry

## 2021-04-21 ENCOUNTER — Ambulatory Visit (INDEPENDENT_AMBULATORY_CARE_PROVIDER_SITE_OTHER): Payer: Medicare HMO | Admitting: Podiatry

## 2021-04-21 DIAGNOSIS — R200 Anesthesia of skin: Secondary | ICD-10-CM

## 2021-04-21 DIAGNOSIS — E119 Type 2 diabetes mellitus without complications: Secondary | ICD-10-CM

## 2021-04-21 DIAGNOSIS — B351 Tinea unguium: Secondary | ICD-10-CM

## 2021-04-21 DIAGNOSIS — M79674 Pain in right toe(s): Secondary | ICD-10-CM

## 2021-04-21 DIAGNOSIS — M79675 Pain in left toe(s): Secondary | ICD-10-CM | POA: Diagnosis not present

## 2021-04-21 DIAGNOSIS — R202 Paresthesia of skin: Secondary | ICD-10-CM

## 2021-04-21 NOTE — Progress Notes (Signed)
This patient returns to my office for at risk foot care.  This patient requires this care by a professional since this patient will be at risk due to having diabetes.  This patient is unable to cut nails herself since the patient cannot reach her nails.These nails are painful walking and wearing shoes.  This patient presents for at risk foot care today.  General Appearance  Alert, conversant and in no acute stress.  Vascular  Dorsalis pedis   pulses are palpable  bilaterally.   Posterior tibial pulses are absent  B/L. Capillary return is within normal limits  bilaterally. Cold feet   Bilaterally.  Absent hair  B/L.  Neurologic  Senn-Weinstein monofilament wire test within normal limits  bilaterally. Muscle power within normal limits bilaterally.  Nails Thick disfigured discolored nails with subungual debris  from hallux to fifth toes bilaterally. No evidence of bacterial infection or drainage bilaterally.  Orthopedic  No limitations of motion  feet .  No crepitus or effusions noted.  No bony pathology or digital deformities noted.  Skin  normotropic skin with no porokeratosis noted bilaterally.  No signs of infections or ulcers noted.     Onychomycosis  Pain in right toes  Pain in left toes  Consent was obtained for treatment procedures.   Mechanical debridement of nails 1-5  bilaterally performed with a nail nipper.  Filed with dremel without incident.    Return office visit   3 months                   Told patient to return for periodic foot care and evaluation due to potential at risk complications.   Chevez Sambrano DPM  

## 2021-04-22 ENCOUNTER — Telehealth: Payer: Self-pay | Admitting: Family Medicine

## 2021-04-22 DIAGNOSIS — G8929 Other chronic pain: Secondary | ICD-10-CM

## 2021-04-22 DIAGNOSIS — M62838 Other muscle spasm: Secondary | ICD-10-CM

## 2021-04-22 DIAGNOSIS — M7918 Myalgia, other site: Secondary | ICD-10-CM

## 2021-04-27 MED ORDER — MAGNESIUM 500 MG PO CAPS
500.0000 mg | ORAL_CAPSULE | Freq: Two times a day (BID) | ORAL | 0 refills | Status: DC
Start: 2021-04-27 — End: 2021-08-02

## 2021-05-05 ENCOUNTER — Other Ambulatory Visit: Payer: Self-pay | Admitting: Internal Medicine

## 2021-05-11 ENCOUNTER — Telehealth: Payer: Self-pay | Admitting: Family Medicine

## 2021-05-11 DIAGNOSIS — E119 Type 2 diabetes mellitus without complications: Secondary | ICD-10-CM

## 2021-05-11 DIAGNOSIS — I10 Essential (primary) hypertension: Secondary | ICD-10-CM

## 2021-05-11 DIAGNOSIS — E7800 Pure hypercholesterolemia, unspecified: Secondary | ICD-10-CM

## 2021-05-11 DIAGNOSIS — E559 Vitamin D deficiency, unspecified: Secondary | ICD-10-CM

## 2021-05-11 NOTE — Telephone Encounter (Signed)
-----   Message from Ellamae Sia sent at 04/28/2021  8:28 AM EDT ----- Regarding: Lab orders for Friday, 9.30.22 Patient is scheduled for CPX labs, please order future labs, Thanks , Karna Christmas

## 2021-05-13 ENCOUNTER — Other Ambulatory Visit: Payer: Medicare HMO

## 2021-05-20 ENCOUNTER — Encounter: Payer: Medicare HMO | Admitting: Family Medicine

## 2021-06-08 ENCOUNTER — Ambulatory Visit: Payer: Medicare HMO | Attending: Pain Medicine | Admitting: Pain Medicine

## 2021-06-08 ENCOUNTER — Other Ambulatory Visit: Payer: Self-pay

## 2021-06-08 DIAGNOSIS — G894 Chronic pain syndrome: Secondary | ICD-10-CM

## 2021-06-08 DIAGNOSIS — Z79891 Long term (current) use of opiate analgesic: Secondary | ICD-10-CM

## 2021-06-08 DIAGNOSIS — G8929 Other chronic pain: Secondary | ICD-10-CM

## 2021-06-08 DIAGNOSIS — M79601 Pain in right arm: Secondary | ICD-10-CM

## 2021-06-08 DIAGNOSIS — M5137 Other intervertebral disc degeneration, lumbosacral region: Secondary | ICD-10-CM | POA: Diagnosis not present

## 2021-06-08 DIAGNOSIS — M79605 Pain in left leg: Secondary | ICD-10-CM

## 2021-06-08 DIAGNOSIS — F1120 Opioid dependence, uncomplicated: Secondary | ICD-10-CM

## 2021-06-08 DIAGNOSIS — M545 Low back pain, unspecified: Secondary | ICD-10-CM | POA: Diagnosis not present

## 2021-06-08 DIAGNOSIS — M533 Sacrococcygeal disorders, not elsewhere classified: Secondary | ICD-10-CM | POA: Diagnosis not present

## 2021-06-08 DIAGNOSIS — M79604 Pain in right leg: Secondary | ICD-10-CM

## 2021-06-08 DIAGNOSIS — M542 Cervicalgia: Secondary | ICD-10-CM

## 2021-06-08 DIAGNOSIS — M79602 Pain in left arm: Secondary | ICD-10-CM

## 2021-06-08 DIAGNOSIS — M25552 Pain in left hip: Secondary | ICD-10-CM

## 2021-06-08 DIAGNOSIS — M25551 Pain in right hip: Secondary | ICD-10-CM

## 2021-06-08 DIAGNOSIS — M25561 Pain in right knee: Secondary | ICD-10-CM

## 2021-06-08 DIAGNOSIS — Z79899 Other long term (current) drug therapy: Secondary | ICD-10-CM

## 2021-06-08 DIAGNOSIS — M25562 Pain in left knee: Secondary | ICD-10-CM

## 2021-06-08 DIAGNOSIS — M47816 Spondylosis without myelopathy or radiculopathy, lumbar region: Secondary | ICD-10-CM

## 2021-06-08 MED ORDER — OXYCODONE HCL 5 MG PO TABS: 5.0000 mg | ORAL_TABLET | Freq: Three times a day (TID) | ORAL | 0 refills | Status: DC | PRN

## 2021-06-08 NOTE — Patient Instructions (Signed)

## 2021-06-08 NOTE — Progress Notes (Signed)
Patient: Teresa Mcgee  Service Category: E/Teresa  Provider: Gaspar Cola, MD  DOB: [birth year]  DOS: 06/08/2021  Location: Office  MRN: [id number]  Setting: Ambulatory outpatient  Referring Provider: Abner Greenspan, MD  Type: Established Patient  Specialty: Interventional Pain Management  PCP: Abner Greenspan, MD  Location: Remote location  Delivery: TeleHealth     Virtual Encounter - Pain Management PROVIDER NOTE: Information contained herein reflects review and annotations entered in association with encounter. Interpretation of such information and data should be left to medically-trained personnel. Information provided to patient can be located elsewhere in the medical record under "Patient Instructions". Document created using STT-dictation technology, any transcriptional errors that may result from process are unintentional.    Contact & Pharmacy Preferred: [place]: [phone] (home) Mobile: [phone] (mobile) E-mail: [email or web address]  [id number]: [phone]: [phone]  Pre-screening  Ms. HFerrufinooffered "in-person" vs "virtual" encounter. She indicated preferring virtual for this encounter.   Reason COVID-19*  Social distancing based on CDC and AMA recommendations.   I contacted Teresa Blalockon 06/08/2021 via telephone.      I clearly identified myself as FGaspar Cola MD. I verified that I was speaking with the correct person using two identifiers (Name: Teresa Mcgee and date of birth: 117-Jan-1967.  Consent I sought verbal advanced consent from Teresa Blalockfor virtual visit interactions. I informed [name] possible security and privacy concerns, risks, and limitations associated with providing "not-in-person" medical evaluation and management services. I also informed [name] the availability of "in-person" appointments.  Finally, I informed her that there would be a charge for the virtual visit and that she could be  personally, fully or partially, financially responsible for it. [name]   Historic Elements   Ms. MJARIAH TARKOWSKIis a 55y.o. year old, female patient evaluated today after our last contact on 04/10/2021. Ms. HChacko has a past medical history of Arthritis, Bell's palsy (may 2012), Chest pain, Diabetes mellitus without complication ([place], GERD (gastroesophageal reflux disease), H/O hiatal hernia, Hyperlipidemia, Hypertension, Hypothyroidism, Shingles, Shortness of breath, and Sleep apnea. She also  has a past surgical history that includes Thyroidectomy (1998); cesarian; Partial hysterectomy (2000); Cesarean section; Tubal ligation; Tooth extraction; Abdominal hysterectomy; Colonoscopy with propofol (N/A, 11/27/2019); and Esophagogastroduodenoscopy (egd) with propofol (N/A, 07/20/2020). [name] which includes the following prescription(s): accu-chek fastclix lancets, accu-chek guide, albuterol, amlodipine, azelastine, blood glucose meter kit and supplies, [facility] d3, ezetimibe, fluticasone, gabapentin, hydrochlorothiazide, levocetirizine, levothyroxine, magnesium, meloxicam, metformin, nexletol, olopatadine hcl, omeprazole, [START ON 06/09/2021] oxycodone, [START ON 07/09/2021] oxycodone, [START ON 08/08/2021] oxycodone, refresh celluvisc, and tizanidine. She  reports that she has been smoking cigarettes. She has a 5.00 pack-year smoking history. She has never used smokeless tobacco. She reports that she does not drink alcohol and does not use drugs. [name].   HPI  Today, she is being contacted for medication management.  The patient indicates doing well with the current medication regimen. No adverse reactions or side effects reported to the medications.   RTCB: 09/07/2021 Nonopioids transfer 08/11/2020:  Gabapentin, meloxicam, and Zanaflex  Pharmacotherapy Assessment   Analgesic: Oxycodone IR 5 mg, 1 tab PO q 8 hrs (PRN) (15 mg/day of oxycodone) MME/day: 22.5 mg/day.   Monitoring: Teresa Mcgee PMP: PDMP reviewed during this encounter.  Pharmacotherapy: No side-effects or adverse reactions reported. Compliance: No problems identified. Effectiveness: Clinically acceptable. Plan: Refer to "POC". UDS:  Summary  Date Value Ref Range Status  03/09/2021 Note  Final    Comment:    ==================================================================== [employer] Select 13 (MW) ==================================================================== Test                             Result       Flag       Units  Drug Present and Declared for Prescription Verification   Oxymorphone                    489          EXPECTED   ng/mg creat   Noroxycodone                   139          EXPECTED   ng/mg creat    Oxymorphone and noroxycodone are expected metabolites of oxycodone.    Sources of oxycodone are scheduled prescription medications.    Oxymorphone is also available as a scheduled prescription medication.  Drug Absent but Declared for Prescription Verification   Oxycodone                      Not Detected UNEXPECTED ng/mg creat    Oxycodone is almost always present in patients taking this drug    consistently.  Absence of oxycodone could be due to lapse of time    since the last dose or unusual pharmacokinetics (rapid metabolism).  ==================================================================== Test                      Result    Flag   Units      Ref Range   Creatinine              46               mg/dL      >=20 ==================================================================== Declared Medications:  The flagging and interpretation on this report are based on the  following declared medications.  Unexpected results may arise from  inaccuracies in the declared medications.   **Note:  The testing scope of this panel includes these medications:   Oxycodone (Roxicodone)   **Note: The testing scope of this panel does not include the  following reported medications:   Albuterol (Ventolin HFA)  Amlodipine (Norvasc)  Azelastine (Optivar)  Bempedoic Acid (Nexletol)  Eye Drop  Ezetimibe (Zetia)  Fluticasone (Flonase)  Gabapentin (Neurontin)  Hydrochlorothiazide (Hydrodiuril)  Levocetirizine (Xyzal)  Levothyroxine (Synthroid)  Magnesium  Meloxicam (Mobic)  Metformin (Glucophage)  Olopatadine  Omeprazole (Prilosec)  Tizanidine (Zanaflex)  Vitamin D3 ==================================================================== For clinical consultation, please call [phone]. ====================================================================      Laboratory Chemistry Profile   Renal Lab Results  Component Value Date   BUN 8 04/16/2020   CREATININE 0.88 04/16/2020   BCR 8 (L) 01/24/2018   GFR 81.11 04/16/2020   GFRAA >60 05/25/2018   GFRNONAA >60 05/25/2018    Hepatic Lab Results  Component Value Date   AST 64 (H) 04/16/2020   ALT 66 (H) 04/16/2020   ALBUMIN 4.5 04/16/2020   ALKPHOS 64 04/16/2020   LIPASE 25 05/25/2018    Electrolytes Lab Results  Component Value Date   NA 141 04/16/2020   K 3.6 04/16/2020   CL 100 04/16/2020   CALCIUM 10.0  04/16/2020   MG 2.0 01/24/2018    Bone Lab Results  Component Value Date   VD25OH 41.99 04/16/2020   25OHVITD1 9.3 (L) 01/24/2018   25OHVITD2 <1.0 01/24/2018   25OHVITD3 9.3 01/24/2018    Inflammation (CRP: Acute Phase) (ESR: Chronic Phase) Lab Results  Component Value Date   CRP 5.5 (H) 01/24/2018   ESRSEDRATE 40 01/24/2018         Note: Above Lab results reviewed.  Imaging  MM 3D SCREEN BREAST BILATERAL CLINICAL DATA:  Screening.  EXAM: DIGITAL SCREENING BILATERAL MAMMOGRAM WITH TOMO AND CAD  COMPARISON:  None.  ACR Breast Density Category a: The breast tissue is almost  entirely fatty.  FINDINGS: There are no findings suspicious for malignancy. Images were processed with CAD.  IMPRESSION: No mammographic evidence of malignancy. A result letter of this screening mammogram will be mailed directly to the patient.  RECOMMENDATION: Screening mammogram in one year. (Code:SM-B-01Y)  BI-RADS CATEGORY  1: Negative.  Electronically Signed   By: Audie Pinto Teresa.D.   On: 11/10/2019 10:40  Assessment  The primary encounter diagnosis was Chronic low back pain (1ry area of Pain) (Bilateral) (L>R) w/o sciatica. Diagnoses of DDD (degenerative disc disease), lumbosacral, Lumbar facet syndrome (Bilateral), Chronic sacroiliac joint pain (Left), Chronic lower extremity pain (2ry area of Pain) (Bilateral) (L>R), Chronic hip pain (Bilateral) (R>L), Chronic knee pain (Bilateral) (R>L), Chronic neck pain (3ry area of Pain) (Bilateral) (L>R), Chronic upper extremity pain (4th area of Pain) (Bilateral) (L>R), Chronic pain syndrome, Pharmacologic therapy, Chronic use of opiate for therapeutic purpose, Uncomplicated opioid dependence ([employer]), and Encounter for medication management were also pertinent to this visit.  Plan of Care  Problem-specific:  No problem-specific Assessment & Plan notes found for this encounter.  Ms. LAURALI GODDARD has a current medication list which includes the following long-term medication(s): albuterol, amlodipine, ezetimibe, fluticasone, gabapentin, hydrochlorothiazide, levocetirizine, levothyroxine, magnesium, meloxicam, metformin, omeprazole, [START ON 06/09/2021] oxycodone, [START ON 07/09/2021] oxycodone, [START ON 08/08/2021] oxycodone, and tizanidine.  Pharmacotherapy (Medications Ordered): Meds ordered this encounter  Medications   oxyCODONE (OXY IR/ROXICODONE) 5 MG immediate release tablet    Sig: Take 1 tablet (5 mg total) by mouth every 8 (eight) hours as needed for severe pain. Must last 30 days    Dispense:  90 tablet    Refill:  0     DO NOT: delete (not duplicate); no partial-fill (will deny script to complete), no refill request (F/U required). DISPENSE: 1 day early if closed on fill date. WARN: No CNS-depressants within 8 hrs of med.   oxyCODONE (OXY IR/ROXICODONE) 5 MG immediate release tablet    Sig: Take 1 tablet (5 mg total) by mouth every 8 (eight) hours as needed for severe pain. Must last 30 days    Dispense:  90 tablet    Refill:  0    DO NOT: delete (not duplicate); no partial-fill (will deny script to complete), no refill request (F/U required). DISPENSE: 1 day early if closed on fill date. WARN: No CNS-depressants within 8 hrs of med.   oxyCODONE (OXY IR/ROXICODONE) 5 MG immediate release tablet    Sig: Take 1 tablet (5 mg total) by mouth every 8 (eight) hours as needed for severe pain. Must last 30 days    Dispense:  90 tablet    Refill:  0    DO NOT: delete (not duplicate); no partial-fill (will deny script to complete), no refill request (F/U required). DISPENSE: 1 day early if closed on fill date.  WARN: No CNS-depressants within 8 hrs of med.    Orders:  No orders of the defined types were placed in this encounter.  Follow-up plan:   Return in about 13 weeks (around 09/07/2021) for Eval-day (Teresa,W), (F2F), (MM).     Interventional management options:  Considering:   NOTE: NO RFA until BMI <35  Possible bilateral lumbar facet RFA  Diagnostic left-sided sacroiliac joint block  Possible left-sided sacroiliac joint RFA  Diagnostic bilateral IA hip joint injection  Diagnostic bilateral femoral nerve + obturator NB  Possible bilateral femoral nerve + obturator nerve RFA  Diagnostic left-sided LESI  Diagnostic bilateral transforaminal ESI  Diagnostic left-sided CESI  Diagnostic bilateral cervical facet block  Possible bilateral cervical facet RFA  Diagnostic bilateral IA knee joint injections with local anesthetic and steroid  Possible series of 5 bilateral intra-articular Hyalgan knee injections   Diagnostic bilateral genicular nerve blocks  Possible bilateral genicular nerve RFA  Diagnostic trigger point injections    Palliative PRN treatment(s):   Palliative bilateral lumbar facet block      Recent Visits No visits were found meeting these conditions. Showing recent visits within past 90 days and meeting all other requirements Today's Visits Date Type Provider Dept  06/08/21 Office Visit Milinda Pointer, MD [facility]  Showing today's visits and meeting all other requirements Future Appointments Date Type Provider Dept  08/29/21 Appointment Milinda Pointer, MD [facility]  Showing future appointments within next 90 days and meeting all other requirements I discussed the assessment and treatment plan with the patient. The patient was provided an opportunity to ask questions and all were answered. The patient agreed with the plan and demonstrated an understanding of the instructions.  Patient advised to call back or seek an in-person evaluation if the symptoms or condition worsens.  Duration of encounter: 13 minutes.  Note by: Gaspar Cola, MD Date: 06/08/2021; Time: 12:06 PM

## 2021-06-10 ENCOUNTER — Other Ambulatory Visit: Payer: Self-pay | Admitting: Family Medicine

## 2021-06-10 DIAGNOSIS — E1165 Type 2 diabetes mellitus with hyperglycemia: Secondary | ICD-10-CM

## 2021-06-11 ENCOUNTER — Other Ambulatory Visit: Payer: Self-pay | Admitting: Family Medicine

## 2021-06-11 DIAGNOSIS — M150 Primary generalized (osteo)arthritis: Secondary | ICD-10-CM

## 2021-06-11 DIAGNOSIS — I10 Essential (primary) hypertension: Secondary | ICD-10-CM

## 2021-06-11 DIAGNOSIS — M159 Polyosteoarthritis, unspecified: Secondary | ICD-10-CM

## 2021-06-13 ENCOUNTER — Telehealth: Payer: Self-pay | Admitting: Family Medicine

## 2021-06-13 DIAGNOSIS — E039 Hypothyroidism, unspecified: Secondary | ICD-10-CM

## 2021-06-13 MED ORDER — LEVOTHYROXINE SODIUM 125 MCG PO TABS: 125.0000 ug | ORAL_TABLET | Freq: Every day | ORAL | 0 refills | Status: DC

## 2021-06-13 NOTE — Telephone Encounter (Signed)
CPE scheduled for 08/10/21, last filled on 09/23/20 #90 tabs with 2 refills

## 2021-06-13 NOTE — Telephone Encounter (Signed)
  Encourage patient to contact the pharmacy for refills or they can request refills through [place]:  Please schedule appointment if longer than 1 year  NEXT APPOINTMENT DATE: 08/03/21  MEDICATION:levothyroxine  Is the patient out of medication? no  [facility] [place]  Let patient know to contact pharmacy at the end of the day to make sure medication is ready.  Please notify patient to allow 48-72 hours to process  CLINICAL FILLS OUT ALL BELOW:   LAST REFILL:  QTY:  REFILL DATE: fff   OTHER COMMENTS:    Okay for refill?  Please advise

## 2021-06-17 ENCOUNTER — Telehealth: Payer: Self-pay | Admitting: Family Medicine

## 2021-06-17 MED ORDER — ALBUTEROL SULFATE HFA 108 (90 BASE) MCG/ACT IN AERS: INHALATION_SPRAY | RESPIRATORY_TRACT | 2 refills | Status: DC

## 2021-06-17 NOTE — Addendum Note (Signed)
Addended by: Tammi Sou on: 06/17/2021 04:00 PM   Modules accepted: Orders

## 2021-06-17 NOTE — Telephone Encounter (Signed)
  Encourage patient to contact the pharmacy for refills or they can request refills through [place]:  Please schedule appointment if longer than 1 year  NEXT APPOINTMENT DATE:08/03/21  MEDICATION:albuterol (VENTOLIN HFA) 108 (90 Base) MCG/ACT inhaler  Is the patient out of medication? yes  [id number] [place] [place] [address]  Let patient know to contact pharmacy at the end of the day to make sure medication is ready.  Please notify patient to allow 48-72 hours to process  CLINICAL FILLS OUT ALL BELOW:   LAST REFILL:  QTY:  REFILL DATE:    OTHER COMMENTS:    Okay for refill?  Please advise

## 2021-07-05 ENCOUNTER — Other Ambulatory Visit: Payer: Self-pay | Admitting: Family Medicine

## 2021-07-05 DIAGNOSIS — J302 Other seasonal allergic rhinitis: Secondary | ICD-10-CM

## 2021-07-21 ENCOUNTER — Other Ambulatory Visit: Payer: Self-pay

## 2021-07-21 ENCOUNTER — Ambulatory Visit (INDEPENDENT_AMBULATORY_CARE_PROVIDER_SITE_OTHER): Payer: Medicare HMO | Admitting: Podiatry

## 2021-07-21 ENCOUNTER — Encounter: Payer: Self-pay | Admitting: Podiatry

## 2021-07-21 DIAGNOSIS — M79675 Pain in left toe(s): Secondary | ICD-10-CM

## 2021-07-21 DIAGNOSIS — E119 Type 2 diabetes mellitus without complications: Secondary | ICD-10-CM

## 2021-07-21 DIAGNOSIS — M79674 Pain in right toe(s): Secondary | ICD-10-CM

## 2021-07-21 DIAGNOSIS — B351 Tinea unguium: Secondary | ICD-10-CM | POA: Diagnosis not present

## 2021-07-21 DIAGNOSIS — M792 Neuralgia and neuritis, unspecified: Secondary | ICD-10-CM

## 2021-07-21 NOTE — Progress Notes (Signed)
This patient returns to my office for at risk foot care.  This patient requires this care by a professional since this patient will be at risk due to having diabetes.  This patient is unable to cut nails herself since the patient cannot reach her nails.These nails are painful walking and wearing shoes.  This patient presents for at risk foot care today.  General Appearance  Alert, conversant and in no acute stress.  Vascular  Dorsalis pedis   pulses are palpable  bilaterally.   Posterior tibial pulses are absent  B/L. Capillary return is within normal limits  bilaterally. Cold feet   Bilaterally.  Absent hair  B/L.  Neurologic  Senn-Weinstein monofilament wire test within normal limits  bilaterally. Muscle power within normal limits bilaterally.  Nails Thick disfigured discolored nails with subungual debris  from hallux to fifth toes bilaterally. No evidence of bacterial infection or drainage bilaterally.  Orthopedic  No limitations of motion  feet .  No crepitus or effusions noted.  No bony pathology or digital deformities noted.  Skin  normotropic skin with no porokeratosis noted bilaterally.  No signs of infections or ulcers noted.     Onychomycosis  Pain in right toes  Pain in left toes  Consent was obtained for treatment procedures.   Mechanical debridement of nails 1-5  bilaterally performed with a nail nipper.  Filed with dremel without incident.    Return office visit   3 months                   Told patient to return for periodic foot care and evaluation due to potential at risk complications.   Allisyn Kunz DPM  

## 2021-07-31 ENCOUNTER — Other Ambulatory Visit: Payer: Self-pay | Admitting: Family Medicine

## 2021-07-31 DIAGNOSIS — M62838 Other muscle spasm: Secondary | ICD-10-CM

## 2021-07-31 DIAGNOSIS — G8929 Other chronic pain: Secondary | ICD-10-CM

## 2021-07-31 DIAGNOSIS — M7918 Myalgia, other site: Secondary | ICD-10-CM

## 2021-08-03 ENCOUNTER — Other Ambulatory Visit: Payer: Medicare HMO

## 2021-08-10 ENCOUNTER — Encounter: Payer: Medicare HMO | Admitting: Family Medicine

## 2021-08-17 ENCOUNTER — Encounter: Payer: Medicare HMO | Admitting: Family Medicine

## 2021-08-18 ENCOUNTER — Other Ambulatory Visit: Payer: Self-pay | Admitting: Family Medicine

## 2021-08-18 DIAGNOSIS — I10 Essential (primary) hypertension: Secondary | ICD-10-CM

## 2021-08-19 ENCOUNTER — Encounter: Payer: Self-pay | Admitting: Family Medicine

## 2021-08-19 ENCOUNTER — Other Ambulatory Visit: Payer: Self-pay

## 2021-08-19 ENCOUNTER — Ambulatory Visit (INDEPENDENT_AMBULATORY_CARE_PROVIDER_SITE_OTHER): Payer: Medicare HMO | Admitting: Family Medicine

## 2021-08-19 VITALS — BP 136/78 | HR 63 | Temp 97.2°F | Ht 62.5 in | Wt 236.0 lb

## 2021-08-19 DIAGNOSIS — E785 Hyperlipidemia, unspecified: Secondary | ICD-10-CM

## 2021-08-19 DIAGNOSIS — G4733 Obstructive sleep apnea (adult) (pediatric): Secondary | ICD-10-CM

## 2021-08-19 DIAGNOSIS — E559 Vitamin D deficiency, unspecified: Secondary | ICD-10-CM

## 2021-08-19 DIAGNOSIS — Z0000 Encounter for general adult medical examination without abnormal findings: Secondary | ICD-10-CM | POA: Diagnosis not present

## 2021-08-19 DIAGNOSIS — Z720 Tobacco use: Secondary | ICD-10-CM

## 2021-08-19 DIAGNOSIS — E039 Hypothyroidism, unspecified: Secondary | ICD-10-CM

## 2021-08-19 DIAGNOSIS — E119 Type 2 diabetes mellitus without complications: Secondary | ICD-10-CM | POA: Diagnosis not present

## 2021-08-19 DIAGNOSIS — I10 Essential (primary) hypertension: Secondary | ICD-10-CM

## 2021-08-19 DIAGNOSIS — Z6841 Body Mass Index (BMI) 40.0 and over, adult: Secondary | ICD-10-CM

## 2021-08-19 DIAGNOSIS — Z1231 Encounter for screening mammogram for malignant neoplasm of breast: Secondary | ICD-10-CM | POA: Insufficient documentation

## 2021-08-19 DIAGNOSIS — E1169 Type 2 diabetes mellitus with other specified complication: Secondary | ICD-10-CM

## 2021-08-19 LAB — LIPID PANEL
Cholesterol: 167 mg/dL (ref 0–200)
HDL: 34.4 mg/dL — ABNORMAL LOW (ref >39.00)
LDL Cholesterol: 100 mg/dL — ABNORMAL HIGH (ref 0–99)
NonHDL: 132.85
Total CHOL/HDL Ratio: 5
Triglycerides: 164 mg/dL — ABNORMAL HIGH (ref 0.0–149.0)
VLDL: 32.8 mg/dL (ref 0.0–40.0)

## 2021-08-19 LAB — MICROALBUMIN / CREATININE URINE RATIO
Creatinine,U: 53.1 mg/dL
Microalb Creat Ratio: 1.3 mg/g (ref 0.0–30.0)
Microalb, Ur: <0.7 mg/dL (ref 0.0–1.9)

## 2021-08-19 LAB — CBC WITH DIFFERENTIAL/PLATELET
Basophils Absolute: 0.1 10*3/uL (ref 0.0–0.1)
Basophils Relative: 1.4 % (ref 0.0–3.0)
Eosinophils Absolute: 0.2 10*3/uL (ref 0.0–0.7)
Eosinophils Relative: 3.3 % (ref 0.0–5.0)
HCT: 41.9 % (ref 36.0–46.0)
Hemoglobin: 13.8 g/dL (ref 12.0–15.0)
Lymphocytes Relative: 54.2 % — ABNORMAL HIGH (ref 12.0–46.0)
Lymphs Abs: 3.4 10*3/uL (ref 0.7–4.0)
MCHC: 32.9 g/dL (ref 30.0–36.0)
MCV: 88.7 fl (ref 78.0–100.0)
Monocytes Absolute: 0.4 10*3/uL (ref 0.1–1.0)
Monocytes Relative: 6.9 % (ref 3.0–12.0)
Neutro Abs: 2.1 10*3/uL (ref 1.4–7.7)
Neutrophils Relative %: 34.2 % — ABNORMAL LOW (ref 43.0–77.0)
Platelets: 188 10*3/uL (ref 150.0–400.0)
RBC: 4.72 Mil/uL (ref 3.87–5.11)
RDW: 14 % (ref 11.5–15.5)
WBC: 6.2 10*3/uL (ref 4.0–10.5)

## 2021-08-19 LAB — TSH: TSH: 0.27 u[IU]/mL — ABNORMAL LOW (ref 0.35–5.50)

## 2021-08-19 LAB — COMPREHENSIVE METABOLIC PANEL
ALT: 66 U/L — ABNORMAL HIGH (ref 0–35)
AST: 76 U/L — ABNORMAL HIGH (ref 0–37)
Albumin: 4.5 g/dL (ref 3.5–5.2)
Alkaline Phosphatase: 75 U/L (ref 39–117)
BUN: 12 mg/dL (ref 6–23)
CO2: 33 mEq/L — ABNORMAL HIGH (ref 19–32)
Calcium: 10.2 mg/dL (ref 8.4–10.5)
Chloride: 99 mEq/L (ref 96–112)
Creatinine, Ser: 1 mg/dL (ref 0.40–1.20)
GFR: 63.62 mL/min (ref >60.00)
Glucose, Bld: 88 mg/dL (ref 70–99)
Potassium: 3.7 mEq/L (ref 3.5–5.1)
Sodium: 139 mEq/L (ref 135–145)
Total Bilirubin: 0.7 mg/dL (ref 0.2–1.2)
Total Protein: 8 g/dL (ref 6.0–8.3)

## 2021-08-19 LAB — HEMOGLOBIN A1C: Hgb A1c MFr Bld: 6.2 % (ref 4.6–6.5)

## 2021-08-19 LAB — VITAMIN D 25 HYDROXY (VIT D DEFICIENCY, FRACTURES): VITD: 68.72 ng/mL (ref 30.00–100.00)

## 2021-08-19 NOTE — Assessment & Plan Note (Signed)
A1C ordered Taking metformin 500 mg bid and tolerates it  Better diet recently   utd foot care  Enc to schedule eye exam  microalb ordered  No statin, under care of cardiology

## 2021-08-19 NOTE — Assessment & Plan Note (Signed)
D level today  Oral supplementation

## 2021-08-19 NOTE — Progress Notes (Signed)
Subjective:    Patient ID: Teresa Mcgee, female    DOB: [birth year], 56 y.o.   MRN: [id number]  This visit occurred during the SARS-CoV-2 public health emergency.  Safety protocols were in place, including screening questions prior to the visit, additional usage of staff PPE, and extensive cleaning of exam room while observing appropriate contact time as indicated for disinfecting solutions.   HPI  Here for health maintenance exam and to review chronic medical problems   Wt Readings from Last 3 Encounters:  08/19/21 236 lb (107 kg)  03/09/21 246 lb (111.6 kg)  11/24/20 246 lb (111.6 kg)   42.48 kg/m  Doing ok  Working on weight loss   She does a Hydrologist with lemon/ginger and tumeric  Drinks it every night  Tolerates it well Reduces appetite Eating better   Activity around house - more than priot  No exercise program  Dealing with chronic pain     Had amw in 4/22  Reviewed   Zoster status- not immunized/not yet interested Covid immunized-pending covalent booster soon Tdap 2012 -will get after booster  Flu shot - declines , wants to wait until after covid booster   Mammogram 10/2019-wants to schedule  Self breast exam-no new lumps or changes  Maunt with breast cancer    Pap 2012 Had a partial hysterectomy   Colonoscopy 11/2019 with 5 y recall (after pos cologuard test)   Smoking status : down to 1/2 ppd max  Thinks about quitting but not ready   Strong fam h/o heart dz Has had stress testing in the past   Brother died in July 09, 2023 -found him dead on the side of the road  He had heart dz and worse 02  Waiting on coroner's report  Suspect sudden cardiac death    HTN bp is stable today  No cp or palpitations or headaches or edema  No side effects to medicines  BP Readings from Last 3 Encounters:  08/19/21 136/78  03/09/21 (!) 137/93  11/08/20 137/80   Amlodipine 10 mg daily  Hctz 25 mg daily   Lab Results  Component Value Date    CREATININE 0.88 04/16/2020   BUN 8 04/16/2020   NA 141 04/16/2020   K 3.6 04/16/2020   CL 100 04/16/2020   CO2 33 (H) 04/16/2020    DM2 Lab Results  Component Value Date   HGBA1C 5.8 (A) 04/16/2020   Saw podiatry on 12/8  Better diet /wt loss  She avoids sweets and bread now  Metformin 500 mg bid   Lab Results  Component Value Date   MICROALBUR <0.7 04/16/2020   MICROALBUR 1.8 03/06/2019    Hypothyroidism  Pt has no clinical changes No change in energy level/ hair or skin/ edema and no tremor Lab Results  Component Value Date   TSH 1.59 04/16/2020    Due for labs  Levothyroxine 125 mcg daily   Hyperlipidemia Lab Results  Component Value Date   CHOL 143 11/18/2020   HDL 34 (L) 11/18/2020   LDLCALC 79 11/18/2020   TRIG 175 (H) 11/18/2020   CHOLHDL 4.2 11/18/2020   Zetia Lipid specialist added bempedoic Sees cardiology   Vit d def- taking D3  Patient Active Problem List   Diagnosis Date Noted   Routine general medical examination at a health care facility 08/19/2021   Encounter for screening mammogram for breast cancer 08/19/2021   Chronic use of opiate for therapeutic purpose 67/54/4920   Uncomplicated opioid dependence ([place]) 11/04/2020  Ragged cuticle 09/23/2020   Type 2 diabetes mellitus without complication, without long-term current use of insulin ([place]) 03/30/2019   Pain due to onychomycosis of toenails of both feet 03/03/2019   Morbid obesity with BMI of 45.0-49.9, adult ([place]) 09/16/2018   Cervicalgia 04/22/2018   Chronic musculoskeletal pain 04/22/2018   DDD (degenerative disc disease), thoracic 04/22/2018   DDD (degenerative disc disease), cervical 04/22/2018   Lumbar facet arthropathy (Bilateral) 04/22/2018   Lumbar facet syndrome (Bilateral) 04/22/2018   Chronic low back pain (1ry area of Pain) (Bilateral) (L>R) w/o sciatica 04/22/2018   Spondylosis without myelopathy or radiculopathy, cervical region 04/22/2018   Spondylosis without  myelopathy or radiculopathy, lumbosacral region 04/22/2018   DDD (degenerative disc disease), lumbosacral 04/22/2018   Strain of lumbar paraspinal muscle, sequela 04/22/2018   Chronic hip pain (Bilateral) (R>L) 04/22/2018   Chronic sacroiliac joint pain (Left) 04/22/2018   Neurogenic pain 04/22/2018   Osteoarthritis involving multiple joints 04/22/2018   Chronic knee pain (Bilateral) (R>L) 04/22/2018   Chronic upper extremity pain (4th area of Pain) (Bilateral) (L>R) 02/27/2018   Vitamin D deficiency 02/27/2018   Chronic low back pain (Bilateral) (L>R) w/ sciatica (Bilateral) 01/24/2018   Chronic lower extremity pain (2ry area of Pain) (Bilateral) (L>R) 01/24/2018   Chronic neck pain (3ry area of Pain) (Bilateral) (L>R) 01/24/2018   Chronic pain syndrome 01/24/2018   Long term current use of opiate analgesic 01/24/2018   Pharmacologic therapy 01/24/2018   Disorder of skeletal system 01/24/2018   Problems influencing health status 01/24/2018   Osteopathic Exam Findings (Somatic Dysfunction) 04/25/2012   Constipation 01/11/2012   Chest pain 01/11/2012   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   Numbness and tingling in both hands 08/25/2011   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   Muscle spasms of neck 05/23/2011   Nasal mucositis (ulcerative) 05/23/2011   Fatigue 03/21/2011   Tobacco abuse 05/24/2010   Hypothyroidism 01/25/2010   HYPERTENSION, BENIGN ESSENTIAL 01/25/2010   Hyperlipidemia associated with type 2 diabetes mellitus ([place]) 12/31/2009   DEPRESSION 12/31/2009   GERD 12/31/2009   Sleep apnea 12/31/2009   Past Medical History:  Diagnosis Date   Arthritis    Bell's palsy may 2012   Chest pain    Diabetes mellitus without complication (HCC)    GERD (gastroesophageal reflux disease)    H/O hiatal hernia    Hyperlipidemia    Hypertension    Hypothyroidism    Shingles    Shortness of breath    Sleep apnea    uses cpap   Past Surgical History:  Procedure Laterality Date    ABDOMINAL HYSTERECTOMY     CESAREAN SECTION     cesarian     3x   COLONOSCOPY WITH PROPOFOL N/A 11/27/2019   Procedure: COLONOSCOPY WITH PROPOFOL;  Surgeon: Jonathon Bellows, MD;  [facility] ENDOSCOPY;  Service: Gastroenterology;  Laterality: N/A;   ESOPHAGOGASTRODUODENOSCOPY (EGD) WITH PROPOFOL N/A 07/20/2020   Procedure: ESOPHAGOGASTRODUODENOSCOPY (EGD) WITH PROPOFOL;  Surgeon: Jonathon Bellows, MD;  Location: [facility] ENDOSCOPY;  Service: Gastroenterology;  Laterality: N/A;   PARTIAL HYSTERECTOMY  2000   abnormal uterine bleeding   THYROIDECTOMY  1998   TOOTH EXTRACTION     TUBAL LIGATION     Social History   Tobacco Use   Smoking status: Every Day    Packs/day: 0.50    Years: 20.00    Pack years: 10.00    Types: Cigarettes   Smokeless tobacco: Never   Tobacco comments:    2-3 cigs daily-09/22/2020  Vaping Use  Vaping Use: Never used  Substance Use Topics   Alcohol use: No   Drug use: No   Family History  Problem Relation Age of Onset   Heart attack Father 58       deceased   Diabetes Brother    Stroke Brother 52       twice   Stroke Sister 45   Diabetes Sister    Heart attack Sister 34   Heart attack Brother 39   Breast cancer Maternal Aunt    No Known Allergies Current Outpatient Medications on File Prior to Visit  Medication Sig Dispense Refill   Accu-Chek FastClix Lancets MISC USE UP TO 4 TIMES DAILY AS DIRECTED 102 each 1   ACCU-CHEK GUIDE test strip USE UP TO 4 TIMES DAILY AS DIRECTED 100 strip 5   albuterol (VENTOLIN HFA) 108 (90 Base) MCG/ACT inhaler INHALE 2 PUFFS INTO THE LUNGS EVERY 4 HOURS AS NEEDED FOR (COUGH, SHORTNESS OF BREATH OR WHEEZING.). 18 g 2   amLODipine (NORVASC) 10 MG tablet Take 1 tablet (10 mg total) by mouth daily. 90 tablet 1   azelastine (OPTIVAR) 0.05 % ophthalmic solution Place 1 drop into both eyes 2 (two) times daily as needed. 6 mL 12   blood glucose meter kit and supplies Dispense based on patient and insurance preference. Use up to four  times daily as directed. (FOR ICD-10 E10.9, E11.9). 1 each 0   [employer] D3 125 MCG (5000 UT) capsule TAKE 1 CAPSULE BY MOUTH DAILY WITH BREAKFAST. TAKE ALONG WITH CALCIUM AND MAGNESIUM.  5   ezetimibe (ZETIA) 10 MG tablet Take 1 tablet (10 mg total) by mouth daily. 90 tablet 1   fluticasone (FLONASE) 50 MCG/ACT nasal spray SPRAY 2 SPRAYS INTO EACH NOSTRIL EVERY DAY 48 mL 3   gabapentin (NEURONTIN) 100 MG capsule Take 1 capsule (100 mg total) by mouth 2 (two) times daily AND 3 capsules (300 mg total) at bedtime. 150 capsule 2   hydrochlorothiazide (HYDRODIURIL) 25 MG tablet TAKE 1 TABLET (25 MG TOTAL) BY MOUTH DAILY. AS DIRECTED 90 tablet 0   levocetirizine (XYZAL) 5 MG tablet TAKE 1 TABLET BY MOUTH EVERY DAY IN THE EVENING 90 tablet 1   levothyroxine (SYNTHROID) 125 MCG tablet Take 1 tablet (125 mcg total) by mouth daily before breakfast. 90 tablet 0   Magnesium Oxide 500 MG CAPS TAKE 1 CAPSULE (500 MG TOTAL) BY MOUTH 2 (TWO) TIMES DAILY AT 8 AM AND 10 PM. 180 capsule 0   meloxicam (MOBIC) 15 MG tablet TAKE 1 TABLET BY MOUTH EVERY DAY WITH FOOD AS NEEDED FOR PAIN 90 tablet 0   metFORMIN (GLUCOPHAGE) 500 MG tablet TAKE 1 TABLET BY MOUTH 2 TIMES DAILY WITH A MEAL. 180 tablet 0   NEXLETOL 180 MG TABS TAKE 1 TABLET BY MOUTH EVERY DAY 30 tablet 3   Olopatadine HCl 0.2 % SOLN INSTILL 1 DROP INTO BOTH EYES EVERY DAY AS NEEDED     omeprazole (PRILOSEC) 40 MG capsule Take 1 capsule (40 mg total) by mouth daily. 90 capsule 1   oxyCODONE (OXY IR/ROXICODONE) 5 MG immediate release tablet Take 1 tablet (5 mg total) by mouth every 8 (eight) hours as needed for severe pain. Must last 30 days 90 tablet 0   REFRESH CELLUVISC 1 % GEL APPLY 1 DROP TO EYE 3 (THREE) TIMES DAILY.  12   oxyCODONE (OXY IR/ROXICODONE) 5 MG immediate release tablet Take 1 tablet (5 mg total) by mouth every 8 (eight) hours as needed  for severe pain. Must last 30 days 90 tablet 0   oxyCODONE (OXY IR/ROXICODONE) 5 MG immediate release tablet Take 1  tablet (5 mg total) by mouth every 8 (eight) hours as needed for severe pain. Must last 30 days 90 tablet 0   tiZANidine (ZANAFLEX) 4 MG tablet Take 1 tablet (4 mg total) by mouth every 8 (eight) hours as needed for muscle spasms. 90 tablet 2   No current facility-administered medications on file prior to visit.    Review of Systems  Constitutional:  Negative for activity change, appetite change, fatigue, fever and unexpected weight change.  HENT:  Negative for congestion, ear pain, rhinorrhea, sinus pressure and sore throat.   Eyes:  Negative for pain, redness and visual disturbance.  Respiratory:  Negative for cough, shortness of breath and wheezing.   Cardiovascular:  Negative for chest pain and palpitations.  Gastrointestinal:  Negative for abdominal pain, blood in stool, constipation and diarrhea.  Endocrine: Negative for polydipsia and polyuria.  Genitourinary:  Negative for dysuria, frequency and urgency.  Musculoskeletal:  Positive for arthralgias and back pain. Negative for myalgias.  Skin:  Negative for pallor and rash.  Allergic/Immunologic: Negative for environmental allergies.  Neurological:  Negative for dizziness, syncope and headaches.  Hematological:  Negative for adenopathy. Does not bruise/bleed easily.  Psychiatric/Behavioral:  Negative for decreased concentration and dysphoric mood. The patient is not nervous/anxious.       Objective:   Physical Exam Constitutional:      General: She is not in acute distress.    Appearance: Normal appearance. She is well-developed. She is obese. She is not ill-appearing or diaphoretic.  HENT:     Head: Normocephalic and atraumatic.     Right Ear: Tympanic membrane, ear canal and external ear normal.     Left Ear: Tympanic membrane, ear canal and external ear normal.     Nose: Nose normal. No congestion.     Mouth/Throat:     Mouth: Mucous membranes are moist.     Pharynx: Oropharynx is clear. No posterior oropharyngeal erythema.   Eyes:     General: No scleral icterus.    Extraocular Movements: Extraocular movements intact.     Conjunctiva/sclera: Conjunctivae normal.     Pupils: Pupils are equal, round, and reactive to light.  Neck:     Thyroid: No thyromegaly.     Vascular: No carotid bruit or JVD.  Cardiovascular:     Rate and Rhythm: Normal rate and regular rhythm.     Pulses: Normal pulses.     Heart sounds: Normal heart sounds.    No gallop.  Pulmonary:     Effort: Pulmonary effort is normal. No respiratory distress.     Breath sounds: Normal breath sounds. No wheezing.     Comments: Good air exch Chest:     Chest wall: No tenderness.  Abdominal:     General: Bowel sounds are normal. There is no distension or abdominal bruit.     Palpations: Abdomen is soft. There is no mass.     Tenderness: There is no abdominal tenderness.     Hernia: No hernia is present.  Genitourinary:    Comments: Breast exam: No mass, nodules, thickening, tenderness, bulging, retraction, inflamation, nipple discharge or skin changes noted.  No axillary or clavicular LA.     Musculoskeletal:        General: No tenderness. Normal range of motion.     Cervical back: Normal range of motion and neck supple. No rigidity.  No muscular tenderness.     Right lower leg: No edema.     Left lower leg: No edema.     Comments: No kyphosis  Limited rom of spine  Lymphadenopathy:     Cervical: No cervical adenopathy.  Skin:    General: Skin is warm and dry.     Coloration: Skin is not pale.     Findings: No erythema or rash.     Comments: Solar lentigines diffusely Some sks  Neurological:     Mental Status: She is alert. Mental status is at baseline.     Cranial Nerves: No cranial nerve deficit.     Motor: No abnormal muscle tone.     Coordination: Coordination normal.     Gait: Gait normal.     Deep Tendon Reflexes: Reflexes are normal and symmetric. Reflexes normal.  Psychiatric:        Mood and Affect: Mood normal.         Cognition and Memory: Cognition and memory normal.          Assessment & Plan:   Problem List Items Addressed This Visit       Cardiovascular and Mediastinum   HYPERTENSION, BENIGN ESSENTIAL - Primary    bp in fair control at this time  BP Readings from Last 1 Encounters:  08/19/21 136/78  No changes needed Most recent labs reviewed  Disc lifstyle change with low sodium diet and exercise  Plan to continue amlodipine 10 mg daily and hctz 25 mg daily  Labs ordered         Respiratory   Sleep apnea    Uses cpap        Endocrine   Hypothyroidism    TSH today  No clinical changes  Takes levothyroxine 125 mcg daily       Hyperlipidemia associated with type 2 diabetes mellitus ([place])    Lipid panel ordered Disc goals for lipids and reasons to control them Rev last labs with pt Rev low sat fat diet in detail  Taking zetia  Also bempedoic Sees cardiology      Type 2 diabetes mellitus without complication, without long-term current use of insulin (HCC)    A1C ordered Taking metformin 500 mg bid and tolerates it  Better diet recently   utd foot care  Enc to schedule eye exam  microalb ordered  No statin, under care of cardiology      Relevant Orders   Microalbumin / creatinine urine ratio     Other   Morbid obesity with BMI of 45.0-49.9, adult (HCC) (Chronic)    Discussed how this problem influences overall health and the risks it imposes  Reviewed plan for weight loss with lower calorie diet (via better food choices and also portion control or program like weight watchers) and exercise building up to or more than 30 minutes 5 days per week including some aerobic activity   Commended on wt loss so far      Tobacco abuse   Vitamin D deficiency    D level today  Oral supplementation       Routine general medical examination at a health care facility    Reviewed health habits including diet and exercise and skin cancer prevention Reviewed appropriate  screening tests for age  Also reviewed health mt list, fam hx and immunization status , as well as social and family history   See HPI Labs ordered  Plans to get covalent covid booster  After that flu  and tetanus vaccine  Not ready for shingrix vaccine  Mammogram ordered, pt will schedule colonosocpy is utd  Strongly enc to quit smoking       Encounter for screening mammogram for breast cancer    Nl exam Mammogram ordered Pt given # to schedule      Relevant Orders   MM 3D SCREEN BREAST BILATERAL   Other Visit Diagnoses     HYPERCHOLESTEROLEMIA

## 2021-08-19 NOTE — Patient Instructions (Addendum)
Get your covid bivalent booster- go to [facility]   I still recommend a flu shot also  Then you are due for a tetanus shot - you have to get that in a pharmacy also   Keep working on better diet and weight loss  Labs and urine protein test today   Follow up in 6 months    Call and schedule your mammogram at [facility]   Please call the location of your choice from the menu below to schedule your Mammogram and/or Bone Density appointment.    [facility]                      Phone:  [phone], [place] [address]                                                             Services: Traditional and 3D Mammogram, [facility]                 Phone: [phone] [address], [place] [address]    Service: Bone Density ONLY   *this site does NOT perform mammograms  [place]                        Phone:  [phone] [address], [place] [address]                                            Services:  3D Mammogram and [place] at [facility]   Phone:  [phone]   [place],  [address]  Services: 3D Mammogram and Bone Density  [place] at [facility])  Phone:  [phone]   [address] [place], [place] [address]                                              Services:  3D Mammogram and Bone Density

## 2021-08-19 NOTE — Assessment & Plan Note (Signed)
TSH today  No clinical changes  Takes levothyroxine 125 mcg daily

## 2021-08-19 NOTE — Assessment & Plan Note (Signed)
Uses cpap

## 2021-08-19 NOTE — Assessment & Plan Note (Signed)
bp in fair control at this time  BP Readings from Last 1 Encounters:  08/19/21 136/78   No changes needed Most recent labs reviewed  Disc lifstyle change with low sodium diet and exercise  Plan to continue amlodipine 10 mg daily and hctz 25 mg daily  Labs ordered

## 2021-08-19 NOTE — Assessment & Plan Note (Signed)
Lipid panel ordered Disc goals for lipids and reasons to control them Rev last labs with pt Rev low sat fat diet in detail  Taking zetia  Also bempedoic Sees cardiology

## 2021-08-19 NOTE — Assessment & Plan Note (Signed)
Discussed how this problem influences overall health and the risks it imposes  Reviewed plan for weight loss with lower calorie diet (via better food choices and also portion control or program like weight watchers) and exercise building up to or more than 30 minutes 5 days per week including some aerobic activity   Commended on wt loss so far  

## 2021-08-19 NOTE — Assessment & Plan Note (Signed)
Reviewed health habits including diet and exercise and skin cancer prevention Reviewed appropriate screening tests for age  Also reviewed health mt list, fam hx and immunization status , as well as social and family history   See HPI Labs ordered  Plans to get covalent covid booster  After that flu and tetanus vaccine  Not ready for shingrix vaccine  Mammogram ordered, pt will schedule colonosocpy is utd  Strongly enc to quit smoking

## 2021-08-19 NOTE — Assessment & Plan Note (Signed)
Nl exam Mammogram ordered Pt given # to schedule

## 2021-08-22 ENCOUNTER — Other Ambulatory Visit: Payer: Self-pay | Admitting: Family Medicine

## 2021-08-22 DIAGNOSIS — I10 Essential (primary) hypertension: Secondary | ICD-10-CM

## 2021-08-26 ENCOUNTER — Other Ambulatory Visit: Payer: Self-pay

## 2021-08-26 MED ORDER — LEVOTHYROXINE SODIUM 112 MCG PO TABS: 112.0000 ug | ORAL_TABLET | Freq: Every day | ORAL | 3 refills | Status: DC

## 2021-08-28 NOTE — Progress Notes (Signed)
PROVIDER NOTE: Information contained herein reflects review and annotations entered in association with encounter. Interpretation of such information and data should be left to medically-trained personnel. Information provided to patient can be located elsewhere in the medical record under "Patient Instructions". Document created using STT-dictation technology, any transcriptional errors that may result from process are unintentional.    Patient: Teresa Mcgee  Service Category: E/M  Provider: Gaspar Cola, MD  DOB: [birth year]  DOS: 08/29/2021  Specialty: Interventional Pain Management  MRN: [id number]  Setting: Ambulatory outpatient  PCP: Abner Greenspan, MD  Type: Established Patient    Referring Provider: Abner Greenspan, MD  Location: Office  Delivery: Face-to-face     HPI  [name], a 56 y.o. year old female, is here today because of her Chronic bilateral low back pain without sciatica [M54.50, G89.29]. Ms. Oxendine primary complain today is Back Pain Last encounter: My last encounter with her was on 06/08/2021. Pertinent problems: Ms. Justin has Muscle spasms of neck; Numbness and tingling in both hands; Chronic low back pain (Bilateral) (L>R) w/ sciatica (Bilateral); Chronic lower extremity pain (2ry area of Pain) (Bilateral) (L>R); Chronic neck pain (3ry area of Pain) (Bilateral) (L>R); Chronic pain syndrome; Chronic upper extremity pain (4th area of Pain) (Bilateral) (L>R); Cervicalgia; Chronic musculoskeletal pain; DDD (degenerative disc disease), thoracic; DDD (degenerative disc disease), cervical; Lumbar facet arthropathy (Bilateral); Lumbar facet syndrome (Bilateral); Chronic low back pain (1ry area of Pain) (Bilateral) (L>R) w/o sciatica; Spondylosis without myelopathy or radiculopathy, cervical region; Spondylosis without myelopathy or radiculopathy, lumbosacral region; DDD (degenerative disc disease), lumbosacral; Strain of lumbar paraspinal muscle, sequela; Chronic hip  pain (Bilateral) (R>L); Chronic sacroiliac joint pain (Left); Neurogenic pain; Osteoarthritis involving multiple joints; and Chronic knee pain (Bilateral) (R>L) on their pertinent problem list. Pain Assessment: Severity of Chronic pain is reported as a 3 /10. Location: Back Right, Left, Lower/pain radiaties down her leg at times. Onset: More than a month ago. Quality: Burning, Tingling, Aching, Constant, Throbbing. Timing: Constant. Modifying factor(s): Meds, heating pad. Vitals:  height is '5\' 3"'  (1.6 m) and weight is 234 lb (106.1 kg). Her temperature is 97.1 F (36.2 C) (abnormal). Her blood pressure is 151/90 (abnormal) and her pulse is 70. Her respiration is 15 and oxygen saturation is 98%.   Reason for encounter: medication management.   The patient indicates doing well with the current medication regimen. No adverse reactions or side effects reported to the medications.   RTCB: 12/06/2021 Nonopioids transfer 08/11/2020: Gabapentin, meloxicam, and Zanaflex  Pharmacotherapy Assessment  Analgesic: Oxycodone IR 5 mg, 1 tab PO q 8 hrs (PRN) (15 mg/day of oxycodone) MME/day: 22.5 mg/day.   Monitoring: [place] PMP: PDMP reviewed during this encounter.       Pharmacotherapy: No side-effects or adverse reactions reported. Compliance: No problems identified. Effectiveness: Clinically acceptable.  Chauncey Fischer, RN  08/29/2021  2:28 PM  Sign when Signing Visit Nursing Pain Medication Assessment:  Safety precautions to be maintained throughout the outpatient stay will include: orient to surroundings, keep bed in low position, maintain call bell within reach at all times, provide assistance with transfer out of bed and ambulation.  Medication Inspection Compliance: Pill count conducted under aseptic conditions, in front of the patient. Neither the pills nor the bottle was removed from the patient's sight at any time. Once count was completed pills were immediately returned to the patient in their original  bottle.  Medication: Oxycodone IR Pill/Patch Count:  21 of 90 pills remain  Pill/Patch Appearance: Markings consistent with prescribed medication Bottle Appearance: Standard pharmacy container. Clearly labeled. Filled Date: 39 / 26 / 2022 Last Medication intake:  TodaySafety precautions to be maintained throughout the outpatient stay will include: orient to surroundings, keep bed in low position, maintain call bell within reach at all times, provide assistance with transfer out of bed and ambulation.    Pt stated that she has 5 left in her pill pack at home.      UDS:  Summary  Date Value Ref Range Status  03/09/2021 Note  Final    Comment:    ==================================================================== [facility] 13 (MW) ==================================================================== Test                             Result       Flag       Units  Drug Present and Declared for Prescription Verification   Oxymorphone                    489          EXPECTED   ng/mg creat   Noroxycodone                   139          EXPECTED   ng/mg creat    Oxymorphone and noroxycodone are expected metabolites of oxycodone.    Sources of oxycodone are scheduled prescription medications.    Oxymorphone is also available as a scheduled prescription medication.  Drug Absent but Declared for Prescription Verification   Oxycodone                      Not Detected UNEXPECTED ng/mg creat    Oxycodone is almost always present in patients taking this drug    consistently.  Absence of oxycodone could be due to lapse of time    since the last dose or unusual pharmacokinetics (rapid metabolism).  ==================================================================== Test                      Result    Flag   Units      Ref Range   Creatinine              46               mg/dL      >=20 ==================================================================== Declared Medications:  The  flagging and interpretation on this report are based on the  following declared medications.  Unexpected results may arise from  inaccuracies in the declared medications.   **Note: The testing scope of this panel includes these medications:   Oxycodone (Roxicodone)   **Note: The testing scope of this panel does not include the  following reported medications:   Albuterol (Ventolin HFA)  Amlodipine (Norvasc)  Azelastine (Optivar)  Bempedoic Acid (Nexletol)  Eye Drop  Ezetimibe (Zetia)  Fluticasone (Flonase)  Gabapentin (Neurontin)  Hydrochlorothiazide (Hydrodiuril)  Levocetirizine (Xyzal)  Levothyroxine (Synthroid)  Magnesium  Meloxicam (Mobic)  Metformin (Glucophage)  Olopatadine  Omeprazole (Prilosec)  Tizanidine (Zanaflex)  Vitamin D3 ==================================================================== For clinical consultation, please call [phone]. ====================================================================      ROS  Constitutional: Denies any fever or chills Gastrointestinal: No reported hemesis, hematochezia, vomiting, or acute GI distress Musculoskeletal: Denies any acute onset joint swelling, redness, loss of ROM, or weakness Neurological: No reported episodes of acute onset apraxia, aphasia, dysarthria, agnosia, amnesia, paralysis, loss  of coordination, or loss of consciousness  Medication Review  Accu-Chek FastClix Lancets, Bempedoic Acid, Carboxymethylcellulose Sod PF, Cholecalciferol, Magnesium Oxide, Olopatadine HCl, albuterol, amLODipine, azelastine, blood glucose meter kit and supplies, ezetimibe, fluticasone, gabapentin, glucose blood, hydrochlorothiazide, levocetirizine, levothyroxine, meloxicam, metFORMIN, omeprazole, oxyCODONE, and tiZANidine  History Review  Allergy: Ms. Menger is allergic to statins. Drug: Ms. Robideau  reports no history of drug use. Alcohol:  reports no history of alcohol use. Tobacco:  reports that she has been  smoking cigarettes. She has a 10.00 pack-year smoking history. She has never used smokeless tobacco. Social: [name]  reports that she has been smoking cigarettes. She has a 10.00 pack-year smoking history. She has never used smokeless tobacco. She reports that she does not drink alcohol and does not use drugs. Medical:  has a past medical history of Arthritis, Bell's palsy (may 2012), Chest pain, Diabetes mellitus without complication ([place]), GERD (gastroesophageal reflux disease), H/O hiatal hernia, Hyperlipidemia, Hypertension, Hypothyroidism, Shingles, Shortness of breath, and Sleep apnea. Surgical: Ms. Termine  has a past surgical history that includes Thyroidectomy (1998); cesarian; Partial hysterectomy (2000); Cesarean section; Tubal ligation; Tooth extraction; Abdominal hysterectomy; Colonoscopy with propofol (N/A, 11/27/2019); and Esophagogastroduodenoscopy (egd) with propofol (N/A, 07/20/2020). Family: family history includes Breast cancer in her maternal aunt; Diabetes in her brother and sister; Heart attack (age of onset: 12) in her brother; Heart attack (age of onset: 19) in her sister; Heart attack (age of onset: 74) in her father; Stroke (age of onset: 36) in her brother; Stroke (age of onset: 37) in her sister.  Laboratory Chemistry Profile   Renal Lab Results  Component Value Date   BUN 12 08/19/2021   CREATININE 1.00 08/19/2021   BCR 8 (L) 01/24/2018   GFR 63.62 08/19/2021   GFRAA >60 05/25/2018   GFRNONAA >60 05/25/2018    Hepatic Lab Results  Component Value Date   AST 76 (H) 08/19/2021   ALT 66 (H) 08/19/2021   ALBUMIN 4.5 08/19/2021   ALKPHOS 75 08/19/2021   LIPASE 25 05/25/2018    Electrolytes Lab Results  Component Value Date   NA 139 08/19/2021   K 3.7 08/19/2021   CL 99 08/19/2021   CALCIUM 10.2 08/19/2021   MG 2.0 01/24/2018    Bone Lab Results  Component Value Date   VD25OH 68.72 08/19/2021   25OHVITD1 9.3 (L) 01/24/2018   25OHVITD2 <1.0 01/24/2018    25OHVITD3 9.3 01/24/2018    Inflammation (CRP: Acute Phase) (ESR: Chronic Phase) Lab Results  Component Value Date   CRP 5.5 (H) 01/24/2018   ESRSEDRATE 40 01/24/2018         Note: Above Lab results reviewed.  Recent Imaging Review  MM 3D SCREEN BREAST BILATERAL CLINICAL DATA:  Screening.  EXAM: DIGITAL SCREENING BILATERAL MAMMOGRAM WITH TOMO AND CAD  COMPARISON:  None.  ACR Breast Density Category a: The breast tissue is almost entirely fatty.  FINDINGS: There are no findings suspicious for malignancy. Images were processed with CAD.  IMPRESSION: No mammographic evidence of malignancy. A result letter of this screening mammogram will be mailed directly to the patient.  RECOMMENDATION: Screening mammogram in one year. (Code:SM-B-01Y)  BI-RADS CATEGORY  1: Negative.  Electronically Signed   By: Audie Pinto M.D.   On: 11/10/2019 10:40 Note: Reviewed        Physical Exam  General appearance: Well nourished, well developed, and well hydrated. In no apparent acute distress Mental status: Alert, oriented x 3 (person, place, & time)  Respiratory: No evidence of acute respiratory distress Eyes: PERLA Vitals: BP (!) 151/90    Pulse 70    Temp (!) 97.1 F (36.2 C)    Resp 15    Ht '5\' 3"'  (1.6 m)    Wt 234 lb (106.1 kg)    LMP  (LMP Unknown)    SpO2 98%    BMI 41.45 kg/m  BMI: Estimated body mass index is 41.45 kg/m as calculated from the following:   Height as of this encounter: '5\' 3"'  (1.6 m).   Weight as of this encounter: 234 lb (106.1 kg). Ideal: Ideal body weight: 52.4 kg (115 lb 8.3 oz) Adjusted ideal body weight: 73.9 kg (162 lb 14.6 oz)  Assessment   Status Diagnosis  Controlled Controlled Controlled 1. Chronic low back pain (1ry area of Pain) (Bilateral) (L>R) w/o sciatica   2. Chronic lower extremity pain (2ry area of Pain) (Bilateral) (L>R)   3. Chronic neck pain (3ry area of Pain) (Bilateral) (L>R)   4. Chronic upper extremity pain (4th area  of Pain) (Bilateral) (L>R)   5. Chronic hip pain (Bilateral) (R>L)   6. Chronic knee pain (Bilateral) (R>L)   7. Chronic sacroiliac joint pain (Left)   8. DDD (degenerative disc disease), lumbosacral   9. Lumbar facet syndrome (Bilateral)   10. Chronic pain syndrome   11. Pharmacologic therapy   12. Chronic use of opiate for therapeutic purpose   13. Uncomplicated opioid dependence ([place])   14. Encounter for medication management      Updated Problems: No problems updated.  Plan of Care  Problem-specific:  No problem-specific Assessment & Plan notes found for this encounter.  Ms. AIANNA FAHS has a current medication list which includes the following long-term medication(s): albuterol, amlodipine, ezetimibe, fluticasone, gabapentin, hydrochlorothiazide, levocetirizine, levothyroxine, magnesium oxide, meloxicam, metformin, omeprazole, [START ON 09/07/2021] oxycodone, [START ON 10/07/2021] oxycodone, [START ON 11/06/2021] oxycodone, and tizanidine.  Pharmacotherapy (Medications Ordered): Meds ordered this encounter  Medications   oxyCODONE (OXY IR/ROXICODONE) 5 MG immediate release tablet    Sig: Take 1 tablet (5 mg total) by mouth every 8 (eight) hours as needed for severe pain. Must last 30 days    Dispense:  90 tablet    Refill:  0    DO NOT: delete (not duplicate); no partial-fill (will deny script to complete), no refill request (F/U required). DISPENSE: 1 day early if closed on fill date. WARN: No CNS-depressants within 8 hrs of med.   oxyCODONE (OXY IR/ROXICODONE) 5 MG immediate release tablet    Sig: Take 1 tablet (5 mg total) by mouth every 8 (eight) hours as needed for severe pain. Must last 30 days    Dispense:  90 tablet    Refill:  0    DO NOT: delete (not duplicate); no partial-fill (will deny script to complete), no refill request (F/U required). DISPENSE: 1 day early if closed on fill date. WARN: No CNS-depressants within 8 hrs of med.   oxyCODONE (OXY IR/ROXICODONE)  5 MG immediate release tablet    Sig: Take 1 tablet (5 mg total) by mouth every 8 (eight) hours as needed for severe pain. Must last 30 days    Dispense:  90 tablet    Refill:  0    DO NOT: delete (not duplicate); no partial-fill (will deny script to complete), no refill request (F/U required). DISPENSE: 1 day early if closed on fill date. WARN: No CNS-depressants within 8 hrs of med.   Orders:  No orders  of the defined types were placed in this encounter.  Follow-up plan:   Return in about 3 months (around 12/06/2021) for Eval-day (M,W), (F2F), (MM).     Interventional management options:  Considering:   NOTE: NO RFA until BMI <35  Possible bilateral lumbar facet RFA  Diagnostic left-sided sacroiliac joint block  Possible left-sided sacroiliac joint RFA  Diagnostic bilateral IA hip joint injection  Diagnostic bilateral femoral nerve + obturator NB  Possible bilateral femoral nerve + obturator nerve RFA  Diagnostic left-sided LESI  Diagnostic bilateral transforaminal ESI  Diagnostic left-sided CESI  Diagnostic bilateral cervical facet block  Possible bilateral cervical facet RFA  Diagnostic bilateral IA knee joint injections with local anesthetic and steroid  Possible series of 5 bilateral intra-articular Hyalgan knee injections  Diagnostic bilateral genicular nerve blocks  Possible bilateral genicular nerve RFA  Diagnostic trigger point injections    Palliative PRN treatment(s):   Palliative bilateral lumbar facet block     Recent Visits Date Type Provider Dept  06/08/21 Office Visit Milinda Pointer, MD [facility]  Showing recent visits within past 90 days and meeting all other requirements Today's Visits Date Type Provider Dept  08/29/21 Office Visit Milinda Pointer, MD [facility]  Showing today's visits and meeting all other requirements Future Appointments No visits were found meeting these conditions. Showing future appointments within next  90 days and meeting all other requirements  I discussed the assessment and treatment plan with the patient. The patient was provided an opportunity to ask questions and all were answered. The patient agreed with the plan and demonstrated an understanding of the instructions.  Patient advised to call back or seek an in-person evaluation if the symptoms or condition worsens.  Duration of encounter: 30 minutes.  Note by: Gaspar Cola, MD Date: 08/29/2021; Time: 2:43 PM

## 2021-08-29 ENCOUNTER — Ambulatory Visit: Payer: Medicare HMO | Attending: Pain Medicine | Admitting: Pain Medicine

## 2021-08-29 ENCOUNTER — Encounter: Payer: Self-pay | Admitting: Pain Medicine

## 2021-08-29 ENCOUNTER — Other Ambulatory Visit: Payer: Self-pay

## 2021-08-29 VITALS — BP 151/90 | HR 70 | Temp 97.1°F | Resp 15 | Ht 63.0 in | Wt 234.0 lb

## 2021-08-29 DIAGNOSIS — M79605 Pain in left leg: Secondary | ICD-10-CM | POA: Insufficient documentation

## 2021-08-29 DIAGNOSIS — Z79891 Long term (current) use of opiate analgesic: Secondary | ICD-10-CM

## 2021-08-29 DIAGNOSIS — M79602 Pain in left arm: Secondary | ICD-10-CM | POA: Insufficient documentation

## 2021-08-29 DIAGNOSIS — M25552 Pain in left hip: Secondary | ICD-10-CM | POA: Diagnosis present

## 2021-08-29 DIAGNOSIS — F1120 Opioid dependence, uncomplicated: Secondary | ICD-10-CM | POA: Insufficient documentation

## 2021-08-29 DIAGNOSIS — M533 Sacrococcygeal disorders, not elsewhere classified: Secondary | ICD-10-CM | POA: Insufficient documentation

## 2021-08-29 DIAGNOSIS — M25561 Pain in right knee: Secondary | ICD-10-CM | POA: Diagnosis present

## 2021-08-29 DIAGNOSIS — M545 Low back pain, unspecified: Secondary | ICD-10-CM | POA: Diagnosis not present

## 2021-08-29 DIAGNOSIS — M25551 Pain in right hip: Secondary | ICD-10-CM | POA: Diagnosis present

## 2021-08-29 DIAGNOSIS — G8929 Other chronic pain: Secondary | ICD-10-CM | POA: Diagnosis present

## 2021-08-29 DIAGNOSIS — M5137 Other intervertebral disc degeneration, lumbosacral region: Secondary | ICD-10-CM | POA: Insufficient documentation

## 2021-08-29 DIAGNOSIS — M47816 Spondylosis without myelopathy or radiculopathy, lumbar region: Secondary | ICD-10-CM | POA: Diagnosis present

## 2021-08-29 DIAGNOSIS — M79604 Pain in right leg: Secondary | ICD-10-CM

## 2021-08-29 DIAGNOSIS — Z79899 Other long term (current) drug therapy: Secondary | ICD-10-CM | POA: Diagnosis present

## 2021-08-29 DIAGNOSIS — G894 Chronic pain syndrome: Secondary | ICD-10-CM | POA: Insufficient documentation

## 2021-08-29 DIAGNOSIS — M79601 Pain in right arm: Secondary | ICD-10-CM

## 2021-08-29 DIAGNOSIS — M542 Cervicalgia: Secondary | ICD-10-CM | POA: Diagnosis not present

## 2021-08-29 DIAGNOSIS — M25562 Pain in left knee: Secondary | ICD-10-CM | POA: Insufficient documentation

## 2021-08-29 MED ORDER — OXYCODONE HCL 5 MG PO TABS: 5.0000 mg | ORAL_TABLET | Freq: Three times a day (TID) | ORAL | 0 refills | Status: DC | PRN

## 2021-08-29 NOTE — Patient Instructions (Signed)

## 2021-08-29 NOTE — Progress Notes (Signed)
Nursing Pain Medication Assessment:  Safety precautions to be maintained throughout the outpatient stay will include: orient to surroundings, keep bed in low position, maintain call bell within reach at all times, provide assistance with transfer out of bed and ambulation.  Medication Inspection Compliance: Pill count conducted under aseptic conditions, in front of the patient. Neither the pills nor the bottle was removed from the patient's sight at any time. Once count was completed pills were immediately returned to the patient in their original bottle.  Medication: Oxycodone IR Pill/Patch Count:  21 of 90 pills remain Pill/Patch Appearance: Markings consistent with prescribed medication Bottle Appearance: Standard pharmacy container. Clearly labeled. Filled Date: 69 / 26 / 2022 Last Medication intake:  TodaySafety precautions to be maintained throughout the outpatient stay will include: orient to surroundings, keep bed in low position, maintain call bell within reach at all times, provide assistance with transfer out of bed and ambulation.    Pt stated that she has 5 left in her pill pack at home.

## 2021-09-08 ENCOUNTER — Other Ambulatory Visit: Payer: Self-pay | Admitting: Family Medicine

## 2021-09-08 ENCOUNTER — Other Ambulatory Visit: Payer: Self-pay | Admitting: Internal Medicine

## 2021-09-08 DIAGNOSIS — E039 Hypothyroidism, unspecified: Secondary | ICD-10-CM

## 2021-09-10 ENCOUNTER — Other Ambulatory Visit: Payer: Self-pay | Admitting: Family Medicine

## 2021-09-10 DIAGNOSIS — E1165 Type 2 diabetes mellitus with hyperglycemia: Secondary | ICD-10-CM

## 2021-09-19 ENCOUNTER — Telehealth (INDEPENDENT_AMBULATORY_CARE_PROVIDER_SITE_OTHER): Payer: Medicare HMO | Admitting: Internal Medicine

## 2021-09-19 ENCOUNTER — Encounter: Payer: Self-pay | Admitting: Internal Medicine

## 2021-09-19 VITALS — Wt 234.0 lb

## 2021-09-19 DIAGNOSIS — T466X5A Adverse effect of antihyperlipidemic and antiarteriosclerotic drugs, initial encounter: Secondary | ICD-10-CM

## 2021-09-19 DIAGNOSIS — E785 Hyperlipidemia, unspecified: Secondary | ICD-10-CM | POA: Diagnosis not present

## 2021-09-19 DIAGNOSIS — E119 Type 2 diabetes mellitus without complications: Secondary | ICD-10-CM | POA: Diagnosis not present

## 2021-09-19 DIAGNOSIS — M791 Myalgia, unspecified site: Secondary | ICD-10-CM | POA: Diagnosis not present

## 2021-09-19 DIAGNOSIS — I739 Peripheral vascular disease, unspecified: Secondary | ICD-10-CM | POA: Diagnosis not present

## 2021-09-19 DIAGNOSIS — T466X5D Adverse effect of antihyperlipidemic and antiarteriosclerotic drugs, subsequent encounter: Secondary | ICD-10-CM

## 2021-09-19 DIAGNOSIS — I10 Essential (primary) hypertension: Secondary | ICD-10-CM

## 2021-09-19 MED ORDER — ROSUVASTATIN CALCIUM 5 MG PO TABS: 5.0000 mg | ORAL_TABLET | ORAL | 3 refills | Status: DC

## 2021-09-19 NOTE — Patient Instructions (Signed)
Medication Instructions:  START rosuvastatin (crestor) 5mg  every other day   *If you need a refill on your cardiac medications before your next appointment, please call your pharmacy*   Lab Work: FASTING lipid panel in about 3 months -- complete 1 week before your next visit with Dr. Debara Pickett   If you have labs (blood work) drawn today and your tests are completely normal, you will receive your results only by: [place] (if you have MyChart) OR A paper copy in the mail If you have any lab test that is abnormal or we need to change your treatment, we will call you to review the results.   Testing/Procedures: NONE   Follow-Up: At [facility], you and your health needs are our priority.  As part of our continuing mission to provide you with exceptional heart care, we have created designated Provider Care Teams.  These Care Teams include your primary Cardiologist (physician) and Advanced Practice Providers (APPs -  Physician Assistants and Nurse Practitioners) who all work together to provide you with the care you need, when you need it.  We recommend signing up for the patient portal called "MyChart".  Sign up information is provided on this After Visit Summary.  MyChart is used to connect with patients for Virtual Visits (Telemedicine).  Patients are able to view lab/test results, encounter notes, upcoming appointments, etc.  Non-urgent messages can be sent to your provider as well.   To learn more about what you can do with MyChart, go to [email or web address].    Your next appointment:   3-4 months with Dr. Debara Pickett -- lipid clinic -- video visit

## 2021-09-19 NOTE — Progress Notes (Signed)
Virtual Visit via Video Note   This visit type was conducted due to national recommendations for restrictions regarding the COVID-19 Pandemic (e.g. social distancing) in an effort to limit this patient's exposure and mitigate transmission in our community.  Due to her co-morbid illnesses, this patient is at least at moderate risk for complications without adequate follow up.  This format is felt to be most appropriate for this patient at this time.  The patient does have access to video technology  All issues noted in this document were discussed and addressed.  A limited physical exam could be performed with this format.  Please refer to the patient's chart for her  consent to telehealth for [facility].   Date:  09/19/2021   ID:  Teresa Mcgee, DOB 1966-02-17, MRN [id number] The patient was identified using 2 identifiers.  Evaluation Performed:  Follow-Up Visit  Patient Location:  [address] [place] [address]  Provider location:   [address], [place], [place] [address]  PCP:  Abner Greenspan, MD  Cardiologist:  None Electrophysiologist:  None   Chief Complaint:  Manage dyslipidemia  History of Present Illness:    Teresa Mcgee is a 56 y.o. female who presents via audio/video conferencing for a telehealth visit today.  This is a pleasant 56 year old female with type 2 diabetes, dyslipidemia, hypertension and history of statin intolerance to both atorvastatin and simvastatin, who presents for evaluation and management of dyslipidemia.  She recently had a lipid profile which demonstrated a total cholesterol of 204, triglycerides 117, HDL 46 and LDL 134.  Hemoglobin A1c is 5.8.  She has no known history of heart disease although she had a brother and father both who had heart disease in their 79s.  A CT scan of the abdomen and pelvis was done along with a CT angio of the chest in October 2019 for shortness of breath and upper abdominal pain, this did  not suggest any coronary artery calcification however there is evidence for "peripheral calcified atherosclerotic plaque".  Based on this, she does need aggressive lipid-lowering and would target an LDL to less than 70.  Unfortunately she has not been able to tolerate statins having myalgias.  She is hesitant to try another statin at this point.  She was placed on ezetimibe however her cholesterol remains above target.  We discussed options today.  11/24/2020  [name] seems to be doing well on Nexletol.  She is tolerating it without any significant side effects.  Repeat labs 6 days ago showed total cholesterol 143, triglycerides 175, HDL 34 and LDL at 79.  This is just slightly above goal although she has been more active recently and has made some dietary changes.  I suspect hopefully over the next several months that her cholesterol will become even lower.  09/19/2021  Teresa Mcgee is seen today in follow-up.  Unfortunately her lipids have gone up in the past 6 months.  We are hopeful that with dietary changes and weight loss she would reach her target on Nexletol and ezetimibe, however her most recent lipids demonstrated total cholesterol 167, triglycerides 164, HDL 34 and LDL 100.  Her goal LDL is less than 70 given PAD.  Her TSH was also a little suppressed therefore her thyroid medication was reduced.  This would actually be usually associated with a lower than expected cholesterol.  Therefore I anticipated going higher.  She will need additional therapy.  The patient does not have symptoms  concerning for COVID-19 infection (fever, chills, cough, or new SHORTNESS OF BREATH).    Prior CV studies:   The following studies were reviewed today:  Chart reviewed, lab work reviewed  PMHx:  Past Medical History:  Diagnosis Date   Arthritis    Bell's palsy may 2012   Chest pain    Diabetes mellitus without complication (HCC)    GERD (gastroesophageal reflux disease)    H/O hiatal hernia     Hyperlipidemia    Hypertension    Hypothyroidism    Shingles    Shortness of breath    Sleep apnea    uses cpap    Past Surgical History:  Procedure Laterality Date   ABDOMINAL HYSTERECTOMY     CESAREAN SECTION     cesarian     3x   COLONOSCOPY WITH PROPOFOL N/A 11/27/2019   Procedure: COLONOSCOPY WITH PROPOFOL;  Surgeon: Jonathon Bellows, MD;  Location: [facility] ENDOSCOPY;  Service: Gastroenterology;  Laterality: N/A;   ESOPHAGOGASTRODUODENOSCOPY (EGD) WITH PROPOFOL N/A 07/20/2020   Procedure: ESOPHAGOGASTRODUODENOSCOPY (EGD) WITH PROPOFOL;  Surgeon: Jonathon Bellows, MD;  Location: [facility] ENDOSCOPY;  Service: Gastroenterology;  Laterality: N/A;   PARTIAL HYSTERECTOMY  2000   abnormal uterine bleeding   THYROIDECTOMY  1998   TOOTH EXTRACTION     TUBAL LIGATION      FAMHx:  Family History  Problem Relation Age of Onset   Heart attack Father 59       deceased   Diabetes Brother    Stroke Brother 106       twice   Stroke Sister 45   Diabetes Sister    Heart attack Sister 59   Heart attack Brother 39   Breast cancer Maternal Aunt     SOCHx:   reports that she has been smoking cigarettes. She has a 10.00 pack-year smoking history. She has never used smokeless tobacco. She reports that she does not drink alcohol and does not use drugs.  ALLERGIES:  Allergies  Allergen Reactions   Statins     myopathy    MEDS:  Current Meds  Medication Sig   Accu-Chek FastClix Lancets MISC USE UP TO 4 TIMES DAILY AS DIRECTED   ACCU-CHEK GUIDE test strip USE UP TO 4 TIMES DAILY AS DIRECTED   albuterol (VENTOLIN HFA) 108 (90 Base) MCG/ACT inhaler INHALE 2 PUFFS INTO THE LUNGS EVERY 4 HOURS AS NEEDED FOR (COUGH, SHORTNESS OF BREATH OR WHEEZING.).   amLODipine (NORVASC) 10 MG tablet TAKE 1 TABLET BY MOUTH EVERY DAY   azelastine (OPTIVAR) 0.05 % ophthalmic solution Place 1 drop into both eyes 2 (two) times daily as needed.   blood glucose meter kit and supplies Dispense based on patient and insurance  preference. Use up to four times daily as directed. (FOR ICD-10 E10.9, E11.9).   [employer] D3 125 MCG (5000 UT) capsule TAKE 1 CAPSULE BY MOUTH DAILY WITH BREAKFAST. TAKE ALONG WITH CALCIUM AND MAGNESIUM.   ezetimibe (ZETIA) 10 MG tablet TAKE 1 TABLET BY MOUTH EVERY DAY   fluticasone (FLONASE) 50 MCG/ACT nasal spray SPRAY 2 SPRAYS INTO EACH NOSTRIL EVERY DAY   gabapentin (NEURONTIN) 100 MG capsule Take 1 capsule (100 mg total) by mouth 2 (two) times daily AND 3 capsules (300 mg total) at bedtime.   hydrochlorothiazide (HYDRODIURIL) 25 MG tablet TAKE 1 TABLET (25 MG TOTAL) BY MOUTH DAILY. AS DIRECTED   levocetirizine (XYZAL) 5 MG tablet TAKE 1 TABLET BY MOUTH EVERY DAY IN THE EVENING   levothyroxine (SYNTHROID) 112 MCG  tablet Take 1 tablet (112 mcg total) by mouth daily.   Magnesium Oxide 500 MG CAPS TAKE 1 CAPSULE (500 MG TOTAL) BY MOUTH 2 (TWO) TIMES DAILY AT 8 AM AND 10 PM.   meloxicam (MOBIC) 15 MG tablet TAKE 1 TABLET BY MOUTH EVERY DAY WITH FOOD AS NEEDED FOR PAIN   metFORMIN (GLUCOPHAGE) 500 MG tablet TAKE 1 TABLET BY MOUTH 2 TIMES DAILY WITH A MEAL.   NEXLETOL 180 MG TABS TAKE 1 TABLET BY MOUTH EVERY DAY   Olopatadine HCl 0.2 % SOLN INSTILL 1 DROP INTO BOTH EYES EVERY DAY AS NEEDED   omeprazole (PRILOSEC) 40 MG capsule Take 1 capsule (40 mg total) by mouth daily.   oxyCODONE (OXY IR/ROXICODONE) 5 MG immediate release tablet Take 1 tablet (5 mg total) by mouth every 8 (eight) hours as needed for severe pain. Must last 30 days   [START ON 10/07/2021] oxyCODONE (OXY IR/ROXICODONE) 5 MG immediate release tablet Take 1 tablet (5 mg total) by mouth every 8 (eight) hours as needed for severe pain. Must last 30 days   [START ON 11/06/2021] oxyCODONE (OXY IR/ROXICODONE) 5 MG immediate release tablet Take 1 tablet (5 mg total) by mouth every 8 (eight) hours as needed for severe pain. Must last 30 days   REFRESH CELLUVISC 1 % GEL APPLY 1 DROP TO EYE 3 (THREE) TIMES DAILY.     ROS: Pertinent items noted in  HPI and remainder of comprehensive ROS otherwise negative.  Labs/Other Tests and Data Reviewed:    Recent Labs: 08/19/2021: ALT 66; BUN 12; Creatinine, Ser 1.00; Hemoglobin 13.8; Platelets 188.0; Potassium 3.7; Sodium 139; TSH 0.27   Recent Lipid Panel Lab Results  Component Value Date/Time   CHOL 167 08/19/2021 11:24 AM   CHOL 143 11/18/2020 10:55 AM   TRIG 164.0 (H) 08/19/2021 11:24 AM   HDL 34.40 (L) 08/19/2021 11:24 AM   HDL 34 (L) 11/18/2020 10:55 AM   CHOLHDL 5 08/19/2021 11:24 AM   LDLCALC 100 (H) 08/19/2021 11:24 AM   LDLCALC 79 11/18/2020 10:55 AM   LDLCALC 159 (H) 11/16/2017 03:29 PM    Wt Readings from Last 3 Encounters:  09/19/21 234 lb (106.1 kg)  08/29/21 234 lb (106.1 kg)  08/19/21 236 lb (107 kg)     Exam:    Vital Signs:  Wt 234 lb (106.1 kg)    LMP  (LMP Unknown)    BMI 41.45 kg/m    General appearance: alert, no distress and morbidly obese Lungs: No visual respiratory difficulty Abdomen: Obese Extremities: extremities normal, atraumatic, no cyanosis or edema Skin: Skin color, texture, turgor normal. No rashes or lesions Neurologic: Grossly normal Psych: Pleasant  ASSESSMENT & PLAN:    Mixed dyslipidemia, goal LDL less than 70 Type 2 diabetes Hypertension Family history of premature coronary disease PAD with calcified peripheral atherosclerosis Statin intolerant-myalgias  Ms. Gubler has had an increase in her cholesterol in the past 6 months without the dietary changes and weight loss that was expected.  She is compliant with her medications.  We discussed options including PCSK9 inhibitors or retrial of a statin.  She was fairly against the statin however I thought we could try a very low-dose of rosuvastatin 5 mg every other day to see if it is tolerated.  She had previously tried rosuvastatin 10 according to records and had issues however she may be able to tolerate this.  We will plan repeat lipids in 3 months.  She should continue the Nexletol and  ezetimibe  as well.  COVID-19 Education: The signs and symptoms of COVID-19 were discussed with the patient and how to seek care for testing (follow up with PCP or arrange E-visit).  The importance of social distancing was discussed today.  Patient Risk:   After full review of this patients clinical status, I feel that they are at least moderate risk at this time.  Time:   Today, I have spent 25 minutes with the patient with telehealth technology discussing dyslipidemia, goal LDL cholesterol.     Medication Adjustments/Labs and Tests Ordered: Current medicines are reviewed at length with the patient today.  Concerns regarding medicines are outlined above.   Tests Ordered: No orders of the defined types were placed in this encounter.   Medication Changes: No orders of the defined types were placed in this encounter.   Disposition:  in 3 month(s)  Pixie Casino, MD, [facility], [place] Director of the Advanced Lipid Disorders &  Cardiovascular Risk Reduction Clinic Diplomate of the American Board of Clinical Lipidology Attending Cardiologist  Direct Dial: [phone]   Fax: [phone]  Website:  [email or web address]  Pixie Casino, MD  09/19/2021 7:59 AM

## 2021-10-03 ENCOUNTER — Other Ambulatory Visit: Payer: Self-pay | Admitting: Family Medicine

## 2021-10-03 DIAGNOSIS — M159 Polyosteoarthritis, unspecified: Secondary | ICD-10-CM

## 2021-10-03 NOTE — Telephone Encounter (Signed)
F/u scheduled on 02/21/22 last filled on 06/13/21 #90 tabs w/ 0 refills

## 2021-10-05 ENCOUNTER — Telehealth: Payer: Self-pay | Admitting: Family Medicine

## 2021-10-05 DIAGNOSIS — E039 Hypothyroidism, unspecified: Secondary | ICD-10-CM

## 2021-10-05 NOTE — Telephone Encounter (Signed)
-----   Message from Ellamae Sia sent at 09/19/2021  2:47 PM EST ----- Regarding: Lab orders for Friday, 2.24.23 Lab orders, no f/u appt

## 2021-10-07 ENCOUNTER — Other Ambulatory Visit (INDEPENDENT_AMBULATORY_CARE_PROVIDER_SITE_OTHER): Payer: Medicare HMO

## 2021-10-07 ENCOUNTER — Other Ambulatory Visit: Payer: Self-pay

## 2021-10-07 DIAGNOSIS — E039 Hypothyroidism, unspecified: Secondary | ICD-10-CM | POA: Diagnosis not present

## 2021-10-07 LAB — TSH: TSH: 0.56 u[IU]/mL (ref 0.35–5.50)

## 2021-10-24 ENCOUNTER — Other Ambulatory Visit: Payer: Self-pay

## 2021-10-24 ENCOUNTER — Encounter: Payer: Self-pay | Admitting: Podiatry

## 2021-10-24 ENCOUNTER — Ambulatory Visit (INDEPENDENT_AMBULATORY_CARE_PROVIDER_SITE_OTHER): Payer: Medicare HMO | Admitting: Podiatry

## 2021-10-24 DIAGNOSIS — E119 Type 2 diabetes mellitus without complications: Secondary | ICD-10-CM

## 2021-10-24 DIAGNOSIS — M79674 Pain in right toe(s): Secondary | ICD-10-CM

## 2021-10-24 DIAGNOSIS — M792 Neuralgia and neuritis, unspecified: Secondary | ICD-10-CM

## 2021-10-24 DIAGNOSIS — B351 Tinea unguium: Secondary | ICD-10-CM

## 2021-10-24 DIAGNOSIS — M79675 Pain in left toe(s): Secondary | ICD-10-CM | POA: Diagnosis not present

## 2021-10-24 NOTE — Progress Notes (Signed)
This patient returns to my office for at risk foot care.  This patient requires this care by a professional since this patient will be at risk due to having diabetes.  This patient is unable to cut nails herself since the patient cannot reach her nails.These nails are painful walking and wearing shoes.  This patient presents for at risk foot care today.  General Appearance  Alert, conversant and in no acute stress.  Vascular  Dorsalis pedis   pulses are palpable  bilaterally.   Posterior tibial pulses are absent  B/L. Capillary return is within normal limits  bilaterally. Cold feet   Bilaterally.  Absent hair  B/L.  Neurologic  Senn-Weinstein monofilament wire test within normal limits  bilaterally. Muscle power within normal limits bilaterally.  Nails Thick disfigured discolored nails with subungual debris  from hallux to fifth toes bilaterally. No evidence of bacterial infection or drainage bilaterally.  Orthopedic  No limitations of motion  feet .  No crepitus or effusions noted.  No bony pathology or digital deformities noted.  Skin  normotropic skin with no porokeratosis noted bilaterally.  No signs of infections or ulcers noted.     Onychomycosis  Pain in right toes  Pain in left toes  Consent was obtained for treatment procedures.   Mechanical debridement of nails 1-5  bilaterally performed with a nail nipper.  Filed with dremel without incident.    Return office visit   3 months                   Told patient to return for periodic foot care and evaluation due to potential at risk complications.   Lemma Tetro DPM  

## 2021-10-26 ENCOUNTER — Other Ambulatory Visit: Payer: Self-pay | Admitting: Family Medicine

## 2021-10-26 DIAGNOSIS — M792 Neuralgia and neuritis, unspecified: Secondary | ICD-10-CM

## 2021-10-26 DIAGNOSIS — I10 Essential (primary) hypertension: Secondary | ICD-10-CM

## 2021-10-29 ENCOUNTER — Other Ambulatory Visit: Payer: Self-pay

## 2021-10-29 ENCOUNTER — Emergency Department: Payer: Medicare HMO

## 2021-10-29 ENCOUNTER — Emergency Department
Admission: EM | Admit: 2021-10-29 | Discharge: 2021-10-29 | Disposition: A | Discharge status: Home / Self Care | Payer: Medicare HMO | Attending: Emergency Medicine | Admitting: Emergency Medicine

## 2021-10-29 ENCOUNTER — Encounter: Payer: Self-pay | Admitting: Emergency Medicine

## 2021-10-29 DIAGNOSIS — R059 Cough, unspecified: Secondary | ICD-10-CM | POA: Diagnosis present

## 2021-10-29 DIAGNOSIS — E039 Hypothyroidism, unspecified: Secondary | ICD-10-CM | POA: Diagnosis not present

## 2021-10-29 DIAGNOSIS — R051 Acute cough: Secondary | ICD-10-CM | POA: Diagnosis not present

## 2021-10-29 DIAGNOSIS — E119 Type 2 diabetes mellitus without complications: Secondary | ICD-10-CM | POA: Diagnosis not present

## 2021-10-29 DIAGNOSIS — R1110 Vomiting, unspecified: Secondary | ICD-10-CM | POA: Insufficient documentation

## 2021-10-29 DIAGNOSIS — R5383 Other fatigue: Secondary | ICD-10-CM | POA: Diagnosis not present

## 2021-10-29 DIAGNOSIS — I10 Essential (primary) hypertension: Secondary | ICD-10-CM | POA: Insufficient documentation

## 2021-10-29 MED ORDER — PANTOPRAZOLE SODIUM 40 MG PO TBEC: 40.0000 mg | DELAYED_RELEASE_TABLET | Freq: Every day | ORAL | 0 refills | Status: DC

## 2021-10-29 NOTE — ED Triage Notes (Signed)
Pt via POV from home. Pt c/o dry cough since last night. Denies fever. Denies CP/SOB. Pt states her voice is sore. Pt is A&Ox4 and NAD.  ?

## 2021-10-29 NOTE — ED Notes (Signed)
Patient transported to X-ray 

## 2021-10-29 NOTE — ED Notes (Signed)
Dc instructions and scripts reviewed with pt no questions or concerns at this time.  

## 2021-10-29 NOTE — ED Triage Notes (Signed)
Pt started coughing last night and coughing so bad caused vomiting.  ?

## 2021-10-29 NOTE — Discharge Instructions (Addendum)
Your chest X-ray did not show pneumonia.  Your cough could be related to acid reflux or viral infection.  Please start taking the Protonix daily and follow-up with your primary care provider.  You can also take over-the-counter Robitussin as needed for cough. ?

## 2021-10-29 NOTE — ED Provider Notes (Signed)
? ?[facility] ?Provider Note ? ? ? Event Date/Time  ? First MD Initiated Contact with Patient 10/29/21 1604   ?  (approximate) ? ? ?History  ? ?Cough ? ? ?HPI ? ?Teresa Mcgee is a 56 y.o. female with past medical history of GERD, diabetes, sleep apnea on CPAP, hypertension hypothyroidism presents with cough.  Patient started having cough when she put her CPAP on last night.  Has had intermittent cough throughout the day it is dry cough.  He also just felt somewhat fatigued but denies fevers chills hemoptysis shortness of breath or chest pain.  She did have 1 episode of posttussive emesis.  Has not tried anything over-the-counter for it.  Thinks the cough is related to her GERD.  Previously was on omeprazole but this was not helping so she stopped.  Did take a Pepto-Bismol today. ?  ? ?Past Medical History:  ?Diagnosis Date  ? Arthritis   ? Bell's palsy may 2012  ? Chest pain   ? Diabetes mellitus without complication ([place])   ? GERD (gastroesophageal reflux disease)   ? H/O hiatal hernia   ? Hyperlipidemia   ? Hypertension   ? Hypothyroidism   ? Shingles   ? Shortness of breath   ? Sleep apnea   ? uses cpap  ? ? ?Patient Active Problem List  ? Diagnosis Date Noted  ? Routine general medical examination at a health care facility 08/19/2021  ? Encounter for screening mammogram for breast cancer 08/19/2021  ? Chronic use of opiate for therapeutic purpose 11/04/2020  ? Uncomplicated opioid dependence ([place]) 11/04/2020  ? Ragged cuticle 09/23/2020  ? Type 2 diabetes mellitus without complication, without long-term current use of insulin ([place]) 03/30/2019  ? Pain due to onychomycosis of toenails of both feet 03/03/2019  ? Morbid obesity with BMI of 45.0-49.9, adult ([place]) 09/16/2018  ? Cervicalgia 04/22/2018  ? Chronic musculoskeletal pain 04/22/2018  ? DDD (degenerative disc disease), thoracic 04/22/2018  ? DDD (degenerative disc disease), cervical 04/22/2018  ? Lumbar facet arthropathy (Bilateral)  04/22/2018  ? Lumbar facet syndrome (Bilateral) 04/22/2018  ? Chronic low back pain (1ry area of Pain) (Bilateral) (L>R) w/o sciatica 04/22/2018  ? Spondylosis without myelopathy or radiculopathy, cervical region 04/22/2018  ? Spondylosis without myelopathy or radiculopathy, lumbosacral region 04/22/2018  ? DDD (degenerative disc disease), lumbosacral 04/22/2018  ? Strain of lumbar paraspinal muscle, sequela 04/22/2018  ? Chronic hip pain (Bilateral) (R>L) 04/22/2018  ? Chronic sacroiliac joint pain (Left) 04/22/2018  ? Neurogenic pain 04/22/2018  ? Osteoarthritis involving multiple joints 04/22/2018  ? Chronic knee pain (Bilateral) (R>L) 04/22/2018  ? Chronic upper extremity pain (4th area of Pain) (Bilateral) (L>R) 02/27/2018  ? Vitamin D deficiency 02/27/2018  ? Chronic low back pain (Bilateral) (L>R) w/ sciatica (Bilateral) 01/24/2018  ? Chronic lower extremity pain (2ry area of Pain) (Bilateral) (L>R) 01/24/2018  ? Chronic neck pain (3ry area of Pain) (Bilateral) (L>R) 01/24/2018  ? Chronic pain syndrome 01/24/2018  ? Long term current use of opiate analgesic 01/24/2018  ? Pharmacologic therapy 01/24/2018  ? Disorder of skeletal system 01/24/2018  ? Problems influencing health status 01/24/2018  ? Osteopathic Exam Findings (Somatic Dysfunction) 04/25/2012  ? Constipation 01/11/2012  ? Chest pain 01/11/2012  ? 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 Numbness and tingling in both hands 08/25/2011  ? 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 Muscle spasms of neck 05/23/2011  ? Nasal mucositis (ulcerative) 05/23/2011  ? Fatigue 03/21/2011  ? Tobacco abuse 05/24/2010  ?  Hypothyroidism 01/25/2010  ? HYPERTENSION, BENIGN ESSENTIAL 01/25/2010  ? Hyperlipidemia associated with type 2 diabetes mellitus ([place]) 12/31/2009  ? DEPRESSION 12/31/2009  ? GERD 12/31/2009  ? Sleep apnea 12/31/2009  ? ? ? ?Physical Exam  ?Triage Vital Signs: ?ED Triage Vitals  ?Enc Vitals Group  ?   BP 10/29/21 1605 135/76  ?   Pulse Rate 10/29/21 1605 60  ?    Resp 10/29/21 1605 18  ?   Temp 10/29/21 1605 98.4 ?F (36.9 ?C)  ?   Temp src --   ?   SpO2 10/29/21 1605 94 %  ?   Weight 10/29/21 1607 242 lb 8.1 oz (110 kg)  ?   Height 10/29/21 1607 '5\' 3"'$  (1.6 m)  ?   Head Circumference --   ?   Peak Flow --   ?   Pain Score 10/29/21 1606 0  ?   Pain Loc --   ?   Pain Edu? --   ?   Excl. in [place]? --   ? ? ?Most recent vital signs: ?Vitals:  ? 10/29/21 1605  ?BP: 135/76  ?Pulse: 60  ?Resp: 18  ?Temp: 98.4 ?F (36.9 ?C)  ?SpO2: 94%  ? ? ? ?General: Awake, no distress.  ?CV:  Good peripheral perfusion.  ?Resp:  Normal effort.  No increased work of breathing lungs are clear ?Abd:  No distention.  ?Neuro:             Awake, Alert, Oriented x 3  ?Other:   ? ? ?ED Results / Procedures / Treatments  ?Labs ?(all labs ordered are listed, but only abnormal results are displayed) ?Labs Reviewed - No data to display ? ? ?EKG ? ? ? ? ?RADIOLOGY ?I reviewed the CXR which does not show any acute cardiopulmonary process; agree with radiology report  ? ? ? ?PROCEDURES: ? ?Critical Care performed: No ? ?Procedures ? ? ? ?MEDICATIONS ORDERED IN ED: ?Medications - No data to display ? ? ?IMPRESSION / MDM / ASSESSMENT AND PLAN / ED COURSE  ?I reviewed the triage vital signs and the nursing notes. ?             ?               ? ?Differential diagnosis includes, but is not limited to, bronchitis, viral upper respiratory infection, postnasal drip, cough variant asthma, reflux, less likely CHF ? ?Patient is a 56 year old female presenting with cough.  Cough is dry and she has no associated chest pain or shortness of breath.  Started when she was putting on her CPAP last night.  Patient thinks is related to her acid reflux which she is not currently taking a PPI anymore for is she not feel like omeprazole is helping anymore.  Is endorsing intermittent acid taste in her mouth but denies any chest pain or abdominal pain.  She looks well vital signs within normal limits lungs are clear.  Does not appear volume  overloaded.  CXR is  negative for acute process. This could be viral illness versus reflux.  Will start her on Protonix. ? ?  ? ? ?FINAL CLINICAL IMPRESSION(S) / ED DIAGNOSES  ? ?Final diagnoses:  ?Acute cough  ? ? ? ?Rx / DC Orders  ? ?ED Discharge Orders   ? ?      Ordered  ?  pantoprazole (PROTONIX) 40 MG tablet  Daily       ? 10/29/21 1640  ? ?  ?  ? ?  ? ? ? ?  Note:  This document was prepared using Dragon voice recognition software and may include unintentional dictation errors. ?  ?Rada Hay, MD ?10/29/21 1641 ? ?

## 2021-10-31 ENCOUNTER — Telehealth: Payer: Self-pay

## 2021-10-31 NOTE — Telephone Encounter (Signed)
Per chart review tab pt went to [facility] ED on 10/29/21; sending note to Dr Glori Bickers and [place] CMA. ? ? ? ? ?[facility] - Client ?TELEPHONE ADVICE RECORD ?AccessNurse? ?Patient ?Name: ?Teresa H ?Mcgee ?Gender: Unknown ?DOB: Sep 26, 1965 ?Age: 56 Y 3 M 2 D ?Return ?Phone ?Number: ?[id number] ?(Primary) ?Address: ?City/ ?State/ ?Zip: ?[place] ? [address] ?Client [facility] - Client ?Client Site [place] ?Provider [place], Roque Lias - MD ?Contact Type Call ?Who Is Calling Patient / Member / Family / Caregiver ?Call Type Triage / Clinical ?Relationship To Patient Self ?Return Phone Number [phone] (Primary) ?Chief Complaint Cough ?Reason for Call Symptomatic / Request for Health Information ?Initial Comment Caller states she had a bad night with no sleep ?because of [place], indigestion and coughing. Is ?there anything that would help the caller? ?Translation No ?Nurse Assessment ?Nurse: Doren Custard, RN, Caryl Pina Date/Time (Eastern Time): 10/29/2021 11:32:14 AM ?Confirm and document reason for call. If ?symptomatic, describe symptoms. ?---Caller states she has GERD and it kept her awake ?all night. She used to take Nexium, but she ran out and ?never got it refilled. She also has a cough and a sour ?taste in her mouth. Symptoms began 2 days ago. ?Does the patient have any new or worsening ?symptoms? ---Yes ?Will a triage be completed? ---Yes ?Related visit to physician within the last 2 weeks? ---No ?Does the PT have any chronic conditions? (i.e. ?diabetes, asthma, this includes High risk factors for ?pregnancy, etc.) ?---Yes ?List chronic conditions. ---chronic back pain, HTN, cpap, GERD ?Is this a behavioral health or substance abuse call? ---No ?Guidelines ?Guideline Title Affirmed Question Affirmed Notes Nurse Date/Time (Eastern ?Time) ?Abdominal Pain - ?Upper ?[1] MILDMODERATE pain ?AND [2] not relieved ?by antacid medicine ?Doren Custard, RN, Caryl Pina  10/29/2021 11:34:15 ?AM ?Disp. Time (Eastern ?Time) Disposition Final User ?PLEASE NOTE: All timestamps contained within this report are represented as [place] Standard Time. ?CONFIDENTIALTY NOTICE: This fax transmission is intended only for the addressee. It contains information that is legally privileged, confidential or ?otherwise protected from use or disclosure. If you are not the intended recipient, you are strictly prohibited from reviewing, disclosing, copying using ?or disseminating any of this information or taking any action in reliance on or regarding this information. If you have received this fax in error, please ?notify us immediately by telephone so that we can arrange for its return to [place]. Phone: [phone], Toll-Free: [phone], Fax: [phone] ?Page: 2 of 2 ?Call Id: [id number] ?10/29/2021 11:38:51 AM See HCP within 4 Hours (or ?PCP triage) ?Yes Doren Custard, RN, Caryl Pina ?Caller Disagree/Comply Disagree ?Caller Understands Yes ?PreDisposition Call Doctor ?Care Advice Given Per Guideline ?* IF OFFICE WILL BE OPEN: You need to be seen within the next 3 or 4 hours. Call your doctor (or NP/PA) now or as soon as the ?office opens. SEE HCP (OR PCP TRIAGE) WITHIN 4 HOURS: * You become worse CALL BACK IF: CARE ADVICE given per ?Abdominal Pain, Upper (Adult) guideline. ?Referrals ?GO TO FACILITY REFUSE ?

## 2021-10-31 NOTE — Telephone Encounter (Signed)
Called and spoke w/ pt she said that she is feeling better and that the medication has help,advised pt is if symptoms return please call us back to  make an appt. ?

## 2021-10-31 NOTE — Telephone Encounter (Signed)
I reviewed the ER notes ?If her symptoms do not improve back on protonix please f/u in the office  ?

## 2021-11-01 ENCOUNTER — Telehealth: Payer: Medicare HMO | Admitting: Family Medicine

## 2021-11-02 ENCOUNTER — Encounter: Payer: Self-pay | Admitting: Family Medicine

## 2021-11-02 ENCOUNTER — Ambulatory Visit: Payer: Medicare HMO | Admitting: Family Medicine

## 2021-11-02 ENCOUNTER — Other Ambulatory Visit: Payer: Self-pay

## 2021-11-02 ENCOUNTER — Ambulatory Visit (INDEPENDENT_AMBULATORY_CARE_PROVIDER_SITE_OTHER): Payer: Medicare HMO | Admitting: Family Medicine

## 2021-11-02 VITALS — BP 122/76 | HR 73 | Temp 97.2°F | Ht 63.0 in | Wt 235.4 lb

## 2021-11-02 DIAGNOSIS — J209 Acute bronchitis, unspecified: Secondary | ICD-10-CM | POA: Diagnosis not present

## 2021-11-02 DIAGNOSIS — B370 Candidal stomatitis: Secondary | ICD-10-CM | POA: Insufficient documentation

## 2021-11-02 DIAGNOSIS — Z720 Tobacco use: Secondary | ICD-10-CM

## 2021-11-02 DIAGNOSIS — K219 Gastro-esophageal reflux disease without esophagitis: Secondary | ICD-10-CM

## 2021-11-02 MED ORDER — PREDNISONE 10 MG PO TABS: ORAL_TABLET | ORAL | 0 refills | Status: DC

## 2021-11-02 MED ORDER — AMOXICILLIN-POT CLAVULANATE 875-125 MG PO TABS: 1.0000 | ORAL_TABLET | Freq: Two times a day (BID) | ORAL | 0 refills | Status: DC

## 2021-11-02 MED ORDER — NYSTATIN 100000 UNIT/ML MT SUSP: 5.0000 mL | Freq: Three times a day (TID) | OROMUCOSAL | 0 refills | Status: DC

## 2021-11-02 NOTE — Assessment & Plan Note (Signed)
None scrappable white coating on tongue with some mouth discomfort today ?Patient is diabetic ?Prescribe nystatin solution to use as directed ?Instructed to update if no improvement ?

## 2021-11-02 NOTE — Assessment & Plan Note (Signed)
Disc in detail risks of smoking and possible outcomes including copd, vascular/ heart disease, cancer , respiratory and sinus infections  ?Pt voices understanding ?She has cut back with illness ?

## 2021-11-02 NOTE — Assessment & Plan Note (Signed)
Unsure if this is adding to cough but patient does complain of upper abdominal burning at times ?Reviewed old records and she did have gastritis on EGD in 2021 ?Advised she continue the Protonix 40 mg daily and update if no improvement in symptoms ?

## 2021-11-02 NOTE — Patient Instructions (Addendum)
Take prednisone for bronchitis and wheeze and cough  ?It is a tapering dose as directed  ?It will make you feel hyper and hungry  ?Also blood sugar will go up temporarily  ?Take the augmentin in case of bacterial infection  ? ?You may have thrush in your mouth  ?Use the nystatin solution  ?If no improvement let me know  ? ?Keep cutting down the smoking and eventually quit  ? ?Update if not starting to improve in a week or if worsening   ?If you have trouble breathing please go to the ER ? ?

## 2021-11-02 NOTE — Assessment & Plan Note (Signed)
Suspect viral but now with green mucous in a smoker  ?Scant wheezing on exam ?Prednisone and augmentin px /side eff rev ?Fluids rest and symptomatic care reviewed ?ER parameters discussed ?Encouraged smoking cessation ?Was seen in ER early on , Reviewed hospital records, lab results and studies in detail  ,  Reassuring cxr  ? ?

## 2021-11-02 NOTE — Progress Notes (Signed)
Subjective:    Patient ID: Teresa Mcgee, female    DOB: 1966-04-13, 56 y.o.   MRN: [id number]  This visit occurred during the SARS-CoV-2 public health emergency.  Safety protocols were in place, including screening questions prior to the visit, additional usage of staff PPE, and extensive cleaning of exam room while observing appropriate contact time as indicated for disinfecting solutions.   HPI Pt presents for cough/possible GERD  Wt Readings from Last 3 Encounters:  11/02/21 235 lb 6 oz (106.8 kg)  10/29/21 242 lb 8.1 oz (110 kg)  09/19/21 234 lb (106.1 kg)   41.69 kg/m  She was seen in ER on 3/18 at [facility] Presented with cough worse when she put cpap on the night before  Intermittent during the day  Dry  Sometimes acid taste in mouth but no chest or abd pain   They started protonix 40 mg daily  Reassuring cxr   Some fatigue  Vomited once from coughing  Noted prev on omeprazole   EGD was 2021 Had schatzki ring and also gastritis    DG Chest 2 View  Result Date: 10/29/2021 CLINICAL DATA:  Shortness of breath.  Dry cough EXAM: CHEST - 2 VIEW COMPARISON:  05/25/2018 FINDINGS: Cardiac silhouette and mediastinal contours are within normal limits. The lungs are clear. No pleural effusion or pneumothorax. Mild multilevel degenerative disc changes of the thoracic spine. IMPRESSION: No active cardiopulmonary disease. Electronically Signed   By: Neita Garnet M.D.   On: 10/29/2021 16:34    Now getting a lot more hoarse  (? Virus) Started on Sunday and Monday   No ST  Cough is a little productive  Green mucous  A little nasal congestion   No fever at all  No wheezing  No sob at all   Still smokes but not as much while she is sick   Patient Active Problem List   Diagnosis Date Noted   Acute bronchitis 11/02/2021   Thrush 11/02/2021   Routine general medical examination at a health care facility 08/19/2021   Encounter for screening mammogram for breast cancer  08/19/2021   Chronic use of opiate for therapeutic purpose 11/04/2020   Uncomplicated opioid dependence (HCC) 11/04/2020   Ragged cuticle 09/23/2020   Type 2 diabetes mellitus without complication, without long-term current use of insulin (HCC) 03/30/2019   Pain due to onychomycosis of toenails of both feet 03/03/2019   Morbid obesity with BMI of 45.0-49.9, adult (HCC) 09/16/2018   Cervicalgia 04/22/2018   Chronic musculoskeletal pain 04/22/2018   DDD (degenerative disc disease), thoracic 04/22/2018   DDD (degenerative disc disease), cervical 04/22/2018   Lumbar facet arthropathy (Bilateral) 04/22/2018   Lumbar facet syndrome (Bilateral) 04/22/2018   Chronic low back pain (1ry area of Pain) (Bilateral) (L>R) w/o sciatica 04/22/2018   Spondylosis without myelopathy or radiculopathy, cervical region 04/22/2018   Spondylosis without myelopathy or radiculopathy, lumbosacral region 04/22/2018   DDD (degenerative disc disease), lumbosacral 04/22/2018   Strain of lumbar paraspinal muscle, sequela 04/22/2018   Chronic hip pain (Bilateral) (R>L) 04/22/2018   Chronic sacroiliac joint pain (Left) 04/22/2018   Neurogenic pain 04/22/2018   Osteoarthritis involving multiple joints 04/22/2018   Chronic knee pain (Bilateral) (R>L) 04/22/2018   Chronic upper extremity pain (4th area of Pain) (Bilateral) (L>R) 02/27/2018   Vitamin D deficiency 02/27/2018   Chronic low back pain (Bilateral) (L>R) w/ sciatica (Bilateral) 01/24/2018   Chronic lower extremity pain (2ry area of Pain) (Bilateral) (L>R) 01/24/2018   Chronic neck  pain (3ry area of Pain) (Bilateral) (L>R) 01/24/2018   Chronic pain syndrome 01/24/2018   Long term current use of opiate analgesic 01/24/2018   Pharmacologic therapy 01/24/2018   Disorder of skeletal system 01/24/2018   Problems influencing health status 01/24/2018   Osteopathic Exam Findings (Somatic Dysfunction) 04/25/2012   Constipation 01/11/2012   Chest pain 01/11/2012    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   Numbness and tingling in both hands 08/25/2011   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   Muscle spasms of neck 05/23/2011   Nasal mucositis (ulcerative) 05/23/2011   Fatigue 03/21/2011   Tobacco abuse 05/24/2010   Hypothyroidism 01/25/2010   HYPERTENSION, BENIGN ESSENTIAL 01/25/2010   Hyperlipidemia associated with type 2 diabetes mellitus (HCC) 12/31/2009   DEPRESSION 12/31/2009   GERD 12/31/2009   Sleep apnea 12/31/2009   Past Medical History:  Diagnosis Date   Arthritis    Bell's palsy may 2012   Chest pain    Diabetes mellitus without complication (HCC)    GERD (gastroesophageal reflux disease)    H/O hiatal hernia    Hyperlipidemia    Hypertension    Hypothyroidism    Shingles    Shortness of breath    Sleep apnea    uses cpap   Past Surgical History:  Procedure Laterality Date   ABDOMINAL HYSTERECTOMY     CESAREAN SECTION     cesarian     3x   COLONOSCOPY WITH PROPOFOL N/A 11/27/2019   Procedure: COLONOSCOPY WITH PROPOFOL;  Surgeon: Wyline Mood, MD;  Location: [facility] ENDOSCOPY;  Service: Gastroenterology;  Laterality: N/A;   ESOPHAGOGASTRODUODENOSCOPY (EGD) WITH PROPOFOL N/A 07/20/2020   Procedure: ESOPHAGOGASTRODUODENOSCOPY (EGD) WITH PROPOFOL;  Surgeon: Wyline Mood, MD;  Location: [facility] ENDOSCOPY;  Service: Gastroenterology;  Laterality: N/A;   PARTIAL HYSTERECTOMY  2000   abnormal uterine bleeding   THYROIDECTOMY  1998   TOOTH EXTRACTION     TUBAL LIGATION     Social History   Tobacco Use   Smoking status: Every Day    Packs/day: 0.50    Years: 20.00    Pack years: 10.00    Types: Cigarettes   Smokeless tobacco: Never   Tobacco comments:    2-3 cigs daily-09/22/2020  Vaping Use   Vaping Use: Never used  Substance Use Topics   Alcohol use: No   Drug use: No   Family History  Problem Relation Age of Onset   Heart attack Father 77       deceased   Diabetes Brother    Stroke Brother 25       twice   Stroke Sister 45    Diabetes Sister    Heart attack Sister 96   Heart attack Brother 39   Breast cancer Maternal Aunt    Allergies  Allergen Reactions   Statins     myopathy   Current Outpatient Medications on File Prior to Visit  Medication Sig Dispense Refill   Accu-Chek FastClix Lancets MISC USE UP TO 4 TIMES DAILY AS DIRECTED 102 each 1   ACCU-CHEK GUIDE test strip USE UP TO 4 TIMES DAILY AS DIRECTED 100 strip 5   albuterol (VENTOLIN HFA) 108 (90 Base) MCG/ACT inhaler INHALE 2 PUFFS INTO THE LUNGS EVERY 4 HOURS AS NEEDED FOR (COUGH, SHORTNESS OF BREATH OR WHEEZING.). 18 g 2   amLODipine (NORVASC) 10 MG tablet TAKE 1 TABLET BY MOUTH EVERY DAY 90 tablet 1   azelastine (OPTIVAR) 0.05 % ophthalmic solution Place 1 drop into both eyes 2 (two) times daily as  needed. 6 mL 12   blood glucose meter kit and supplies Dispense based on patient and insurance preference. Use up to four times daily as directed. (FOR ICD-10 E10.9, E11.9). 1 each 0   [employer] D3 125 MCG (5000 UT) capsule TAKE 1 CAPSULE BY MOUTH DAILY WITH BREAKFAST. TAKE ALONG WITH CALCIUM AND MAGNESIUM.  5   ezetimibe (ZETIA) 10 MG tablet TAKE 1 TABLET BY MOUTH EVERY DAY 90 tablet 1   fluticasone (FLONASE) 50 MCG/ACT nasal spray SPRAY 2 SPRAYS INTO EACH NOSTRIL EVERY DAY 48 mL 3   gabapentin (NEURONTIN) 100 MG capsule TAKE 1 CAPSULE (100 MG TOTAL) BY MOUTH 2 (TWO) TIMES DAILY AND 3 CAPSULES (300 MG TOTAL) AT BEDTIME. 150 capsule 2   hydrochlorothiazide (HYDRODIURIL) 25 MG tablet TAKE 1 TABLET (25 MG TOTAL) BY MOUTH DAILY. AS DIRECTED 90 tablet 0   levocetirizine (XYZAL) 5 MG tablet TAKE 1 TABLET BY MOUTH EVERY DAY IN THE EVENING 90 tablet 1   levothyroxine (SYNTHROID) 112 MCG tablet TAKE 1 TABLET BY MOUTH EVERY DAY 90 tablet 1   Magnesium Oxide 500 MG CAPS TAKE 1 CAPSULE (500 MG TOTAL) BY MOUTH 2 (TWO) TIMES DAILY AT 8 AM AND 10 PM. 180 capsule 0   meloxicam (MOBIC) 15 MG tablet TAKE 1 TABLET BY MOUTH EVERY DAY WITH FOOD AS NEEDED FOR PAIN 90 tablet 1    metFORMIN (GLUCOPHAGE) 500 MG tablet TAKE 1 TABLET BY MOUTH 2 TIMES DAILY WITH A MEAL. 180 tablet 1   NEXLETOL 180 MG TABS TAKE 1 TABLET BY MOUTH EVERY DAY 30 tablet 1   Olopatadine HCl 0.2 % SOLN INSTILL 1 DROP INTO BOTH EYES EVERY DAY AS NEEDED     omeprazole (PRILOSEC) 40 MG capsule Take 1 capsule (40 mg total) by mouth daily. 90 capsule 1   oxyCODONE (OXY IR/ROXICODONE) 5 MG immediate release tablet Take 1 tablet (5 mg total) by mouth every 8 (eight) hours as needed for severe pain. Must last 30 days 90 tablet 0   [START ON 11/06/2021] oxyCODONE (OXY IR/ROXICODONE) 5 MG immediate release tablet Take 1 tablet (5 mg total) by mouth every 8 (eight) hours as needed for severe pain. Must last 30 days 90 tablet 0   pantoprazole (PROTONIX) 40 MG tablet Take 1 tablet (40 mg total) by mouth daily. 30 tablet 0   REFRESH CELLUVISC 1 % GEL APPLY 1 DROP TO EYE 3 (THREE) TIMES DAILY.  12   rosuvastatin (CRESTOR) 5 MG tablet Take 1 tablet (5 mg total) by mouth every other day. 45 tablet 3   oxyCODONE (OXY IR/ROXICODONE) 5 MG immediate release tablet Take 1 tablet (5 mg total) by mouth every 8 (eight) hours as needed for severe pain. Must last 30 days 90 tablet 0   tiZANidine (ZANAFLEX) 4 MG tablet Take 1 tablet (4 mg total) by mouth every 8 (eight) hours as needed for muscle spasms. 90 tablet 2   No current facility-administered medications on file prior to visit.    Review of Systems  Constitutional:  Positive for fatigue. Negative for activity change, appetite change, fever and unexpected weight change.  HENT:  Positive for congestion, postnasal drip, rhinorrhea and voice change. Negative for ear pain, sinus pressure and sore throat.   Eyes:  Negative for pain, redness and visual disturbance.  Respiratory:  Positive for cough. Negative for shortness of breath, wheezing and stridor.   Cardiovascular:  Negative for chest pain and palpitations.  Gastrointestinal:  Negative for abdominal pain, blood in stool,  constipation and diarrhea.       Epigastric burning    Endocrine: Negative for polydipsia and polyuria.  Genitourinary:  Negative for dysuria, frequency and urgency.  Musculoskeletal:  Positive for back pain. Negative for arthralgias and myalgias.  Skin:  Negative for pallor and rash.  Allergic/Immunologic: Negative for environmental allergies.  Neurological:  Negative for dizziness, syncope and headaches.  Hematological:  Negative for adenopathy. Does not bruise/bleed easily.  Psychiatric/Behavioral:  Negative for decreased concentration and dysphoric mood. The patient is not nervous/anxious.       Objective:   Physical Exam Constitutional:      General: She is not in acute distress.    Appearance: Normal appearance. She is well-developed. She is obese. She is not ill-appearing or diaphoretic.  HENT:     Head: Normocephalic and atraumatic.     Nose: Congestion present.     Mouth/Throat:     Mouth: Mucous membranes are moist.     Pharynx: Oropharynx is clear. No posterior oropharyngeal erythema.     Comments: Patchy white coating on tongue  Not able to scrape with tongue blade Eyes:     General: No scleral icterus.       Right eye: No discharge.        Left eye: No discharge.     Conjunctiva/sclera: Conjunctivae normal.     Pupils: Pupils are equal, round, and reactive to light.  Neck:     Thyroid: No thyromegaly.     Vascular: No carotid bruit or JVD.  Cardiovascular:     Rate and Rhythm: Normal rate and regular rhythm.     Heart sounds: Normal heart sounds.    No gallop.  Pulmonary:     Effort: Pulmonary effort is normal. No respiratory distress.     Breath sounds: No stridor. Wheezing and rhonchi present. No rales.     Comments: Scattered rhonchi and wheezing   No rales  Nl exp phase  Chest:     Chest wall: No tenderness.  Abdominal:     General: There is no distension or abdominal bruit.     Palpations: Abdomen is soft. There is no mass.     Tenderness: There is  no abdominal tenderness. There is no guarding or rebound.     Hernia: No hernia is present.  Musculoskeletal:     Cervical back: Normal range of motion and neck supple.     Right lower leg: No edema.     Left lower leg: No edema.  Lymphadenopathy:     Cervical: No cervical adenopathy.  Skin:    General: Skin is warm and dry.     Coloration: Skin is not pale.     Findings: No rash.  Neurological:     Mental Status: She is alert.     Coordination: Coordination normal.     Deep Tendon Reflexes: Reflexes are normal and symmetric. Reflexes normal.  Psychiatric:        Mood and Affect: Mood normal.          Assessment & Plan:   Problem List Items Addressed This Visit       Respiratory   Acute bronchitis - Primary    Suspect viral but now with green mucous in a smoker  Scant wheezing on exam Prednisone and augmentin px /side eff rev Fluids rest and symptomatic care reviewed ER parameters discussed Encouraged smoking cessation Was seen in ER early on , Reviewed hospital records, lab results and studies in detail  ,  Reassuring cxr          Digestive   GERD    Unsure if this is adding to cough but patient does complain of upper abdominal burning at times Reviewed old records and she did have gastritis on EGD in 2021 Advised she continue the Protonix 40 mg daily and update if no improvement in symptoms      Thrush    None scrappable white coating on tongue with some mouth discomfort today Patient is diabetic Prescribe nystatin solution to use as directed Instructed to update if no improvement      Relevant Medications   nystatin (MYCOSTATIN) 100000 UNIT/ML suspension     Other   Tobacco abuse    Disc in detail risks of smoking and possible outcomes including copd, vascular/ heart disease, cancer , respiratory and sinus infections  Pt voices understanding She has cut back with illness

## 2021-11-04 ENCOUNTER — Telehealth: Payer: Self-pay

## 2021-11-04 NOTE — Telephone Encounter (Signed)
As we discussed, the prednisone is going to raise her blood sugar significantly while she is on it  ?Watch diet/keep up fluids  ?She takes metformin 500 mg bid    ?Can increase it to 1000 mg bid while she is on prednisone then go back to the lower dose when done  ?Glucose readings will still be high but that may help ?Please keep [place] posted  ?

## 2021-11-04 NOTE — Telephone Encounter (Signed)
[facility] - Client ?TELEPHONE ADVICE RECORD ?[facility]? ?Patient ?Name: ?Teresa ?E Mcgee ?Gender: Female ?DOB: 1965-10-06 ?Age: 56 Y 3 M 7 D ?Return ?Phone ?Number: ?[id number] ?(Primary) ?Address: ?City/ ?State/ ?Zip: ?[place] ?[address] ?Client [facility] ?Client Site [place] [facility] ?Provider Tower, Roque Lias - MD ?Contact Type Call ?Who Is Calling Patient / Member / Family / Caregiver ?Call Type Triage / Clinical ?Relationship To Patient Self ?Return Phone Number [phone] (Primary) ?Chief Complaint Blood Sugar High ?Reason for Call Symptomatic / Request for Health Information ?Initial Comment Caller states she was prescribed a RX yesterday ?and now her blood sugar is 207 ?Translation No ?Nurse Assessment ?Nurse: Nicole Cella, RN, [place] Date/Time (Eastern Time): 11/03/2021 5:05:47 PM ?Confirm and document reason for call. If ?symptomatic, describe symptoms. ?---Caller states she was prescribed prednisone ?yesterday and now her blood sugar is 207 about an ?hour ago, does not take insulin, only takes metformin ?BID. ?Does the patient have any new or worsening ?symptoms? ---Yes ?Will a triage be completed? ---Yes ?Related visit to physician within the last 2 weeks? ---Yes ?Does the PT have any chronic conditions? (i.e. ?diabetes, asthma, this includes High risk factors for ?pregnancy, etc.) ?---Yes ?List chronic conditions. ---DM II HLD ?Is this a behavioral health or substance abuse call? ---No ?Guidelines ?Guideline Title Affirmed Question Affirmed Notes Nurse Date/Time (Eastern ?Time) ?Diabetes - High ?Blood Sugar ?Blood glucose 70-240 ?mg/dL (3.9 -13.3 ?mmol/L) ?[place], RN, ?[place] ?11/03/2021 5:08:54 ?PM ?Disp. Time (Eastern ?Time) Disposition Final User ?11/03/2021 5:15:18 PM Home Care Yes [place], RN, [place] ?PLEASE NOTE: All timestamps contained within this report are represented as [place] Standard  Time. ?CONFIDENTIALTY NOTICE: This fax transmission is intended only for the addressee. It contains information that is legally privileged, confidential or ?otherwise protected from use or disclosure. If you are not the intended recipient, you are strictly prohibited from reviewing, disclosing, copying using ?or disseminating any of this information or taking any action in reliance on or regarding this information. If you have received this fax in error, please ?notify us immediately by telephone so that we can arrange for its return to [place]. Phone: [phone], Toll-Free: [phone], Fax: [phone] ?Page: 2 of 2 ?Call Id: [id number] ?Caller Disagree/Comply Comply ?Caller Understands Yes ?PreDisposition Home Care ?Care Advice Given Per Guideline ?HOME CARE: * You should be able to treat this at home. HIGH BLOOD SUGAR (HYPERGLYCEMIA): * Symptoms of mildly ?high blood sugar: Frequent urination (peeing), increased thirst, fatigue, blurred vision. * Symptoms of severely high blood sugar: ?Confusion and coma. * Contributing factors: Not taking medicines as prescribed, eating a high calorie or high sugar diet, taking ?steroid medicines, and infection. GENERAL DIABETES ADVICE: * Testing: Test your blood glucose. Follow your doctor's advice ?regarding how often. * Record-keeping: Keep a daily log of how you are feeling and the results of your tests. * Medicines: Take ?your diabetes medicines as prescribed. * Eat healthy: Work with your doctor or a dietician to develop healthy meal plan. * Exercise: ?Staying physically active is important. 30 minutes of daily activity is best. DAILY BLOOD GLUCOSE GOALS: * You and your ?doctor (or NP/PA) should decide upon your blood glucose goals. Typical goals for most non-pregnant adults who perform daily ?finger-stick blood glucose testing at home are as follows. * Pre-prandial (before meal): 80-130 mg/dL (4.4-7.2 mmol/L) * Postprandial (1-2 hours after a meal): Less than 180 mg/dL (10  mmol/L) MEASURE AND RECORD YOUR BLOOD GLUCOSE: * ?  Measure your blood glucose before breakfast and before going to bed. * Keep a log and show it to your doctor (or NP/PA) at your ?next office visit. EXPECTED COURSE: * You should call back in 3 to 5 days if any of the following happen. * Your blood sugar ?continues to get above 240 mg/dL (13.3 mmol/L). * Your blood sugar continues to be higher than your daily glucose goals set by you ?and your doctor (or NP/PA). * It has been longer than 6 months since you had a Hemoglobin A1C test. CALL BACK IF: * Blood ?glucose over 300 mg/dL (16.7 mmol/L) two or more times in a row * You become worse CARE ADVICE given per Diabetes - High ?Blood Sugar (Adult) guideline ?

## 2021-11-04 NOTE — Telephone Encounter (Signed)
Pt notified of Dr. Tower's instructions and verbalized understanding  

## 2021-11-06 ENCOUNTER — Other Ambulatory Visit: Payer: Self-pay | Admitting: Family Medicine

## 2021-11-06 DIAGNOSIS — M7918 Myalgia, other site: Secondary | ICD-10-CM

## 2021-11-06 DIAGNOSIS — M62838 Other muscle spasm: Secondary | ICD-10-CM

## 2021-11-06 DIAGNOSIS — G8929 Other chronic pain: Secondary | ICD-10-CM

## 2021-11-08 NOTE — Telephone Encounter (Signed)
F/u scheduled on 02/21/22, last filled on 08/02/21 #180 caps with 0 refill ?

## 2021-11-14 ENCOUNTER — Other Ambulatory Visit: Payer: Self-pay | Admitting: Internal Medicine

## 2021-11-22 ENCOUNTER — Ambulatory Visit (INDEPENDENT_AMBULATORY_CARE_PROVIDER_SITE_OTHER): Payer: Medicare HMO | Admitting: Family

## 2021-11-22 ENCOUNTER — Encounter: Payer: Self-pay | Admitting: Family

## 2021-11-22 ENCOUNTER — Other Ambulatory Visit: Payer: Self-pay | Admitting: Family Medicine

## 2021-11-22 VITALS — BP 116/68 | HR 73 | Temp 99.1°F | Resp 16 | Ht 63.0 in | Wt 235.2 lb

## 2021-11-22 DIAGNOSIS — R21 Rash and other nonspecific skin eruption: Secondary | ICD-10-CM | POA: Insufficient documentation

## 2021-11-22 DIAGNOSIS — Z5181 Encounter for therapeutic drug level monitoring: Secondary | ICD-10-CM | POA: Diagnosis not present

## 2021-11-22 DIAGNOSIS — H1031 Unspecified acute conjunctivitis, right eye: Secondary | ICD-10-CM

## 2021-11-22 DIAGNOSIS — I10 Essential (primary) hypertension: Secondary | ICD-10-CM

## 2021-11-22 DIAGNOSIS — B369 Superficial mycosis, unspecified: Secondary | ICD-10-CM

## 2021-11-22 DIAGNOSIS — R7989 Other specified abnormal findings of blood chemistry: Secondary | ICD-10-CM

## 2021-11-22 DIAGNOSIS — J302 Other seasonal allergic rhinitis: Secondary | ICD-10-CM

## 2021-11-22 DIAGNOSIS — E039 Hypothyroidism, unspecified: Secondary | ICD-10-CM

## 2021-11-22 MED ORDER — OFLOXACIN 0.3 % OP SOLN: 1.0000 [drp] | Freq: Four times a day (QID) | OPHTHALMIC | 0 refills | Status: DC

## 2021-11-22 MED ORDER — FLUCONAZOLE 100 MG PO TABS: 100.0000 mg | ORAL_TABLET | Freq: Every day | ORAL | 0 refills | Status: AC

## 2021-11-22 NOTE — Progress Notes (Signed)
PROVIDER NOTE: Information contained herein reflects review and annotations entered in association with encounter. Interpretation of such information and data should be left to medically-trained personnel. Information provided to patient can be located elsewhere in the medical record under "Patient Instructions". Document created using STT-dictation technology, any transcriptional errors that may result from process are unintentional.  ?  ?Patient: Teresa Mcgee  Service Category: E/M  Provider: Gaspar Cola, MD  ?DOB: 1965/10/22  DOS: 11/23/2021  Specialty: Interventional Pain Management  ?MRN: [id number]  Setting: Ambulatory outpatient  PCP: Abner Greenspan, MD  ?Type: Established Patient    Referring Provider: Abner Greenspan, MD  ?Location: Office  Delivery: Face-to-face    ? ?HPI  ?Teresa Mcgee, a 56 y.o. year old female, is here today because of her Chronic pain syndrome [G89.4]. Teresa Mcgee's primary complain today is Back Pain (lower) ?Last encounter: My last encounter with her was on 08/29/2021. ?Pertinent problems: Teresa Mcgee has Muscle spasms of neck; Numbness and tingling in both hands; Chronic low back pain (Bilateral) (L>R) w/ sciatica (Bilateral); Chronic lower extremity pain (2ry area of Pain) (Bilateral) (L>R); Chronic neck pain (3ry area of Pain) (Bilateral) (L>R); Chronic pain syndrome; Chronic upper extremity pain (4th area of Pain) (Bilateral) (L>R); Cervicalgia; Chronic musculoskeletal pain; DDD (degenerative disc disease), thoracic; DDD (degenerative disc disease), cervical; Lumbar facet arthropathy (Bilateral); Lumbar facet syndrome (Bilateral); Chronic low back pain (1ry area of Pain) (Bilateral) (L>R) w/o sciatica; Spondylosis without myelopathy or radiculopathy, cervical region; Spondylosis without myelopathy or radiculopathy, lumbosacral region; DDD (degenerative disc disease), lumbosacral; Strain of lumbar paraspinal muscle, sequela; Chronic hip pain (Bilateral) (R>L);  Chronic sacroiliac joint pain (Left); Neurogenic pain; Osteoarthritis involving multiple joints; and Chronic knee pain (Bilateral) (R>L) on their pertinent problem list. ?Pain Assessment: Severity of Chronic pain is reported as a 6 /10. Location: Back Right, Left, Lower/pain radiaties down leg at times. Onset: More than a month ago. Quality: Tingling, Numbness, Aching. Timing: Constant. Modifying factor(s): Meds and laying down. ?Vitals:  height is '5\' 3"'  (1.6 m) and weight is 235 lb (106.6 kg). Her temperature is 97.1 ?F (36.2 ?C) (abnormal). Her blood pressure is 143/79 (abnormal) and her pulse is 90. Her oxygen saturation is 94%.  ? ?Reason for encounter: medication management.   The patient indicates doing well with the current medication regimen. No adverse reactions or side effects reported to the medications.  ? ?RTCB: 03/06/2022 ?Nonopioids transfer 08/11/2020: Gabapentin, meloxicam, and Zanaflex ? ?Pharmacotherapy Assessment  ?Analgesic: Oxycodone IR 5 mg, 1 tab PO q 8 hrs (PRN) (15 mg/day of oxycodone) ?MME/day: 22.5 mg/day.  ? ?Monitoring: ?[place] PMP: PDMP reviewed during this encounter.       ?Pharmacotherapy: No side-effects or adverse reactions reported. ?Compliance: No problems identified. ?Effectiveness: Clinically acceptable. ? ?Chauncey Fischer, RN  11/23/2021  2:10 PM  Sign when Signing Visit ?Nursing Pain Medication Assessment:  ?Safety precautions to be maintained throughout the outpatient stay will include: orient to surroundings, keep bed in low position, maintain call bell within reach at all times, provide assistance with transfer out of bed and ambulation.  ?Medication Inspection Compliance: Pill count conducted under aseptic conditions, in front of the patient. Neither the pills nor the bottle was removed from the patient's sight at any time. Once count was completed pills were immediately returned to the patient in their original bottle. ? ?Medication: Oxycodone IR ?Pill/Patch Count:  35 of 90  pills remain ?Pill/Patch Appearance: Markings consistent with prescribed medication ?Bottle Appearance: Standard  pharmacy container. Clearly labeled. ?Filled Date: 3 / 31 / 2023 ?Last Medication intake:  TodaySafety precautions to be maintained throughout the outpatient stay will include: orient to surroundings, keep bed in low position, maintain call bell within reach at all times, provide assistance with transfer out of bed and ambulation.  ?   UDS:  ?Summary  ?Date Value Ref Range Status  ?03/09/2021 Note  Final  ?  Comment:  ?  ==================================================================== ?[employer] Select 13 (MW) ?==================================================================== ?Test                             Result       Flag       Units ? ?Drug Present and Declared for Prescription Verification ?  Oxymorphone                    489          EXPECTED   ng/mg creat ?  Noroxycodone                   139          EXPECTED   ng/mg creat ?   Oxymorphone and noroxycodone are expected metabolites of oxycodone. ?   Sources of oxycodone are scheduled prescription medications. ?   Oxymorphone is also available as a scheduled prescription medication. ? ?Drug Absent but Declared for Prescription Verification ?  Oxycodone                      Not Detected UNEXPECTED ng/mg creat ?   Oxycodone is almost always present in patients taking this drug ?   consistently.  Absence of oxycodone could be due to lapse of time ?   since the last dose or unusual pharmacokinetics (rapid metabolism). ? ?==================================================================== ?Test                      Result    Flag   Units      Ref Range ?  Creatinine              46               mg/dL      >=20 ?==================================================================== ?Declared Medications: ? The flagging and interpretation on this report are based on the ? following declared medications.  Unexpected results may arise from ?  inaccuracies in the declared medications. ? ? **Note: The testing scope of this panel includes these medications: ? ? Oxycodone (Roxicodone) ? ? **Note: The testing scope of this panel does not include the ? following reported medications: ? ? Albuterol (Ventolin HFA) ? Amlodipine (Norvasc) ? Azelastine (Optivar) ? Bempedoic Acid (Nexletol) ? Eye Drop ? Ezetimibe (Zetia) ? Fluticasone (Flonase) ? Gabapentin (Neurontin) ? Hydrochlorothiazide (Hydrodiuril) ? Levocetirizine (Xyzal) ? Levothyroxine (Synthroid) ? Magnesium ? Meloxicam (Mobic) ? Metformin (Glucophage) ? Olopatadine ? Omeprazole (Prilosec) ? Tizanidine (Zanaflex) ? Vitamin D3 ?==================================================================== ?For clinical consultation, please call [phone]. ?==================================================================== ?  ?  ? ?ROS  ?Constitutional: Denies any fever or chills ?Gastrointestinal: No reported hemesis, hematochezia, vomiting, or acute GI distress ?Musculoskeletal: Denies any acute onset joint swelling, redness, loss of ROM, or weakness ?Neurological: No reported episodes of acute onset apraxia, aphasia, dysarthria, agnosia, amnesia, paralysis, loss of coordination, or loss of consciousness ? ?Medication Review  ?Accu-Chek FastClix Lancets, Bempedoic Acid, Carboxymethylcellulose Sod PF, Cholecalciferol, Magnesium Oxide, Olopatadine HCl, albuterol, amLODipine, azelastine, blood  glucose meter kit and supplies, ezetimibe, fluconazole, fluticasone, gabapentin, glucose blood, hydrochlorothiazide, levocetirizine, levothyroxine, meloxicam, metFORMIN, nystatin, ofloxacin, oxyCODONE, pantoprazole, rosuvastatin, and tiZANidine ? ?History Review  ?Allergy: Ms. Sonn is allergic to statins. ?Drug: Ms. Anfinson  reports no history of drug use. ?Alcohol:  reports no history of alcohol use. ?Tobacco:  reports that she has been smoking cigarettes. She has a 10.00 pack-year smoking history. She has never used  smokeless tobacco. ?Social: Ms. Pazos  reports that she has been smoking cigarettes. She has a 10.00 pack-year smoking history. She has never used smokeless tobacco. She reports that she does not drink alcohol and do

## 2021-11-22 NOTE — Assessment & Plan Note (Signed)
Ordered lft to be repeated in one week ?

## 2021-11-22 NOTE — Assessment & Plan Note (Signed)
Treating with diflucan ?If no improvement will need to see dermatology ?

## 2021-11-22 NOTE — Progress Notes (Signed)
? ?Established Patient Office Visit ? ?Subjective:  ?Patient ID: Teresa Mcgee, female    DOB: 04/20/1966  Age: 56 y.o. MRN: [id number] ? ?CC:  ?Chief Complaint  ?Patient presents with  ? Rash  ?  On face X 3 days  ? ? ?HPI ?Teresa Mcgee is here today with concerns.  ? ?Itchy scaly rash on face, chin, side of left face, and bil under her eyes and slight on upper eyelid. Itchy, seems to be spreading. Started three days ago  ? ?No new soaps, shampoos, face lotion. No new foods. No new medications.  ? ?She did get over bronchitis two weeks ago, no more with cough. Does have sinus pressure today but slight, slight congestion. Right eye with some goopy discharge, eye not matted. No change in vision. No pain in eyes.  ?No longer staying at her home, staying with her neighbor bc she had knee surgery and she is helping to care for her.  ? ?Was given nystatin for thrush two weeks ago per pt however stopped using after a day because she didn't like the taste. Still with white on tongue and weird metallic sour taste. ? ?Past Medical History:  ?Diagnosis Date  ? Arthritis   ? Bell's palsy may 2012  ? Chest pain   ? Diabetes mellitus without complication ([place])   ? GERD (gastroesophageal reflux disease)   ? H/O hiatal hernia   ? Hyperlipidemia   ? Hypertension   ? Hypothyroidism   ? Shingles   ? Shortness of breath   ? Sleep apnea   ? uses cpap  ? ? ?Past Surgical History:  ?Procedure Laterality Date  ? ABDOMINAL HYSTERECTOMY    ? CESAREAN SECTION    ? cesarian    ? 3x  ? COLONOSCOPY WITH PROPOFOL N/A 11/27/2019  ? Procedure: COLONOSCOPY WITH PROPOFOL;  Surgeon: Jonathon Bellows, MD;  Location: [facility] ENDOSCOPY;  Service: Gastroenterology;  Laterality: N/A;  ? ESOPHAGOGASTRODUODENOSCOPY (EGD) WITH PROPOFOL N/A 07/20/2020  ? Procedure: ESOPHAGOGASTRODUODENOSCOPY (EGD) WITH PROPOFOL;  Surgeon: Jonathon Bellows, MD;  Location: [facility] ENDOSCOPY;  Service: Gastroenterology;  Laterality: N/A;  ? PARTIAL HYSTERECTOMY  2000  ? abnormal uterine  bleeding  ? THYROIDECTOMY  1998  ? TOOTH EXTRACTION    ? TUBAL LIGATION    ? ? ?Family History  ?Problem Relation Age of Onset  ? Heart attack Father 27  ?     deceased  ? Diabetes Brother   ? Stroke Brother 25  ?     twice  ? Stroke Sister 27  ? Diabetes Sister   ? Heart attack Sister 59  ? Heart attack Brother 64  ? Breast cancer Maternal Aunt   ? ? ?Social History  ? ?Socioeconomic History  ? Marital status: Married  ?  Spouse name: Not on file  ? Number of children: Not on file  ? Years of education: Not on file  ? Highest education level: Not on file  ?Occupational History  ? Occupation: disabled  ?  Employer: [employer]  ?  Comment: back  ?Tobacco Use  ? Smoking status: Every Day  ?  Packs/day: 0.50  ?  Years: 20.00  ?  Pack years: 10.00  ?  Types: Cigarettes  ? Smokeless tobacco: Never  ? Tobacco comments:  ?  2-3 cigs daily-09/22/2020  ?Vaping Use  ? Vaping Use: Never used  ?Substance and Sexual Activity  ? Alcohol use: No  ? Drug use: No  ? Sexual activity: Not Currently  ?  Other Topics Concern  ? Not on file  ?Social History Narrative  ? Married but she and husband separated, she helps take care of him (he is in poor health)  ? She lives with her grown son who is mentally disabled  ? She has been on disability since 2011  ? ?Social Determinants of Health  ? ?Financial Resource Strain: Low Risk   ? Difficulty of Paying Living Expenses: Not hard at all  ?Food Insecurity: No Food Insecurity  ? Worried About Charity fundraiser in the Last Year: Never true  ? Ran Out of Food in the Last Year: Never true  ?Transportation Needs: No Transportation Needs  ? Lack of Transportation (Medical): No  ? Lack of Transportation (Non-Medical): No  ?Physical Activity: Inactive  ? Days of Exercise per Week: 0 days  ? Minutes of Exercise per Session: 0 min  ?Stress: No Stress Concern Present  ? Feeling of Stress : Not at all  ?Social Connections: Not on file  ?Intimate Partner Violence: Not At Risk  ? Fear of Current or  Ex-Partner: No  ? Emotionally Abused: No  ? Physically Abused: No  ? Sexually Abused: No  ? ? ?Outpatient Medications Prior to Visit  ?Medication Sig Dispense Refill  ? Accu-Chek FastClix Lancets MISC USE UP TO 4 TIMES DAILY AS DIRECTED 102 each 1  ? ACCU-CHEK GUIDE test strip USE UP TO 4 TIMES DAILY AS DIRECTED 100 strip 5  ? albuterol (VENTOLIN HFA) 108 (90 Base) MCG/ACT inhaler INHALE 2 PUFFS INTO THE LUNGS EVERY 4 HOURS AS NEEDED FOR (COUGH, SHORTNESS OF BREATH OR WHEEZING.). 18 g 2  ? amLODipine (NORVASC) 10 MG tablet TAKE 1 TABLET BY MOUTH EVERY DAY 90 tablet 1  ? amoxicillin-clavulanate (AUGMENTIN) 875-125 MG tablet Take 1 tablet by mouth 2 (two) times daily. 14 tablet 0  ? azelastine (OPTIVAR) 0.05 % ophthalmic solution Place 1 drop into both eyes 2 (two) times daily as needed. 6 mL 12  ? Bempedoic Acid (NEXLETOL) 180 MG TABS TAKE 1 TABLET BY MOUTH EVERY DAY 90 tablet 3  ? blood glucose meter kit and supplies Dispense based on patient and insurance preference. Use up to four times daily as directed. (FOR ICD-10 E10.9, E11.9). 1 each 0  ? [employer] D3 125 MCG (5000 UT) capsule TAKE 1 CAPSULE BY MOUTH DAILY WITH BREAKFAST. TAKE ALONG WITH CALCIUM AND MAGNESIUM.  5  ? ezetimibe (ZETIA) 10 MG tablet TAKE 1 TABLET BY MOUTH EVERY DAY 90 tablet 1  ? fluticasone (FLONASE) 50 MCG/ACT nasal spray SPRAY 2 SPRAYS INTO EACH NOSTRIL EVERY DAY 48 mL 3  ? gabapentin (NEURONTIN) 100 MG capsule TAKE 1 CAPSULE (100 MG TOTAL) BY MOUTH 2 (TWO) TIMES DAILY AND 3 CAPSULES (300 MG TOTAL) AT BEDTIME. 150 capsule 2  ? hydrochlorothiazide (HYDRODIURIL) 25 MG tablet TAKE 1 TABLET (25 MG TOTAL) BY MOUTH DAILY. AS DIRECTED 90 tablet 0  ? levocetirizine (XYZAL) 5 MG tablet TAKE 1 TABLET BY MOUTH EVERY DAY IN THE EVENING 90 tablet 1  ? levothyroxine (SYNTHROID) 112 MCG tablet TAKE 1 TABLET BY MOUTH EVERY DAY 90 tablet 1  ? Magnesium Oxide 500 MG CAPS TAKE 1 CAPSULE (500 MG TOTAL) BY MOUTH 2 (TWO) TIMES DAILY AT 8 AM AND 10 PM. 180 capsule 1  ?  meloxicam (MOBIC) 15 MG tablet TAKE 1 TABLET BY MOUTH EVERY DAY WITH FOOD AS NEEDED FOR PAIN 90 tablet 1  ? metFORMIN (GLUCOPHAGE) 500 MG tablet TAKE 1 TABLET BY  MOUTH 2 TIMES DAILY WITH A MEAL. 180 tablet 1  ? nystatin (MYCOSTATIN) 100000 UNIT/ML suspension Take 5 mLs (500,000 Units total) by mouth in the morning, at noon, and at bedtime. 120 mL 0  ? Olopatadine HCl 0.2 % SOLN INSTILL 1 DROP INTO BOTH EYES EVERY DAY AS NEEDED    ? oxyCODONE (OXY IR/ROXICODONE) 5 MG immediate release tablet Take 1 tablet (5 mg total) by mouth every 8 (eight) hours as needed for severe pain. Must last 30 days 90 tablet 0  ? pantoprazole (PROTONIX) 40 MG tablet Take 1 tablet (40 mg total) by mouth daily. 30 tablet 0  ? predniSONE (DELTASONE) 10 MG tablet Take 3 pills once daily by mouth for 3 days, then 2 pills once daily for 3 days, then 1 pill once daily for 3 days and then stop 18 tablet 0  ? REFRESH CELLUVISC 1 % GEL APPLY 1 DROP TO EYE 3 (THREE) TIMES DAILY.  12  ? rosuvastatin (CRESTOR) 5 MG tablet Take 1 tablet (5 mg total) by mouth every other day. 45 tablet 3  ? omeprazole (PRILOSEC) 40 MG capsule Take 1 capsule (40 mg total) by mouth daily. 90 capsule 1  ? tiZANidine (ZANAFLEX) 4 MG tablet Take 1 tablet (4 mg total) by mouth every 8 (eight) hours as needed for muscle spasms. 90 tablet 2  ? oxyCODONE (OXY IR/ROXICODONE) 5 MG immediate release tablet Take 1 tablet (5 mg total) by mouth every 8 (eight) hours as needed for severe pain. Must last 30 days 90 tablet 0  ? oxyCODONE (OXY IR/ROXICODONE) 5 MG immediate release tablet Take 1 tablet (5 mg total) by mouth every 8 (eight) hours as needed for severe pain. Must last 30 days 90 tablet 0  ? ?No facility-administered medications prior to visit.  ? ? ?Allergies  ?Allergen Reactions  ? Statins   ?  myopathy  ? ? ?ROS ?Review of Systems  ?Constitutional:  Negative for chills, fatigue and fever.  ?HENT:  Positive for congestion and postnasal drip. Negative for ear pain, sinus  pressure and sore throat.   ?     Tongue lesions metallic sour taste in mouth  ?Eyes:  Positive for discharge (right eye yellow thick) and itching (bil). Negative for pain, redness and visual disturbance.  ?Respirat

## 2021-11-22 NOTE — Patient Instructions (Addendum)
Come back one week for lab only for repeat liver function tests.  ? ?It was a pleasure seeing you today! Please do not hesitate to reach out with any questions and or concerns. ? ?Regards,  ? ?Roiza Wiedel ?FNP-C ? ?

## 2021-11-22 NOTE — Assessment & Plan Note (Addendum)
rx for ofloxacin sent to pt pharmacy, take as prescribed.  ?Discussed how to wash eye appropriately with warm warm cloth from inner to outer canthus. Wash bed sheets and pillow cases to prevent re-infection. Frequent handwashing. If no improvement in 24-48 hours with eye drops as prescribed, please call office.  ? ?Total time spent in office with pt going over acute concerns, reviewing prior lab results, consulting with pt's pcp, and thorough physical exam took 32 minutes in office.  ?

## 2021-11-22 NOTE — Assessment & Plan Note (Signed)
Involving 50 % of the face ?And also involving tongue ?Nystatin failure.  ?Reviewed lfts, stable. ? rx 100 mg diflucan po qd for seven days, f/u in seven days for repeat lft ?

## 2021-11-23 ENCOUNTER — Encounter: Payer: Self-pay | Admitting: Pain Medicine

## 2021-11-23 ENCOUNTER — Ambulatory Visit: Payer: Medicare HMO | Attending: Pain Medicine | Admitting: Pain Medicine

## 2021-11-23 VITALS — BP 143/79 | HR 90 | Temp 97.1°F | Ht 63.0 in | Wt 235.0 lb

## 2021-11-23 DIAGNOSIS — M79601 Pain in right arm: Secondary | ICD-10-CM

## 2021-11-23 DIAGNOSIS — M79602 Pain in left arm: Secondary | ICD-10-CM

## 2021-11-23 DIAGNOSIS — M542 Cervicalgia: Secondary | ICD-10-CM | POA: Insufficient documentation

## 2021-11-23 DIAGNOSIS — M25552 Pain in left hip: Secondary | ICD-10-CM

## 2021-11-23 DIAGNOSIS — M79604 Pain in right leg: Secondary | ICD-10-CM

## 2021-11-23 DIAGNOSIS — Z79899 Other long term (current) drug therapy: Secondary | ICD-10-CM | POA: Diagnosis not present

## 2021-11-23 DIAGNOSIS — G894 Chronic pain syndrome: Secondary | ICD-10-CM | POA: Diagnosis not present

## 2021-11-23 DIAGNOSIS — M545 Low back pain, unspecified: Secondary | ICD-10-CM | POA: Diagnosis not present

## 2021-11-23 DIAGNOSIS — M25551 Pain in right hip: Secondary | ICD-10-CM | POA: Diagnosis not present

## 2021-11-23 DIAGNOSIS — M47816 Spondylosis without myelopathy or radiculopathy, lumbar region: Secondary | ICD-10-CM

## 2021-11-23 DIAGNOSIS — M5137 Other intervertebral disc degeneration, lumbosacral region: Secondary | ICD-10-CM | POA: Diagnosis not present

## 2021-11-23 DIAGNOSIS — Z79891 Long term (current) use of opiate analgesic: Secondary | ICD-10-CM | POA: Diagnosis not present

## 2021-11-23 DIAGNOSIS — M25562 Pain in left knee: Secondary | ICD-10-CM | POA: Diagnosis present

## 2021-11-23 DIAGNOSIS — G8929 Other chronic pain: Secondary | ICD-10-CM | POA: Diagnosis not present

## 2021-11-23 DIAGNOSIS — F1120 Opioid dependence, uncomplicated: Secondary | ICD-10-CM | POA: Diagnosis not present

## 2021-11-23 DIAGNOSIS — M533 Sacrococcygeal disorders, not elsewhere classified: Secondary | ICD-10-CM

## 2021-11-23 DIAGNOSIS — M25561 Pain in right knee: Secondary | ICD-10-CM | POA: Diagnosis not present

## 2021-11-23 DIAGNOSIS — M51379 Other intervertebral disc degeneration, lumbosacral region without mention of lumbar back pain or lower extremity pain: Secondary | ICD-10-CM

## 2021-11-23 DIAGNOSIS — M79605 Pain in left leg: Secondary | ICD-10-CM

## 2021-11-23 MED ORDER — OXYCODONE HCL 5 MG PO TABS: 5.0000 mg | ORAL_TABLET | Freq: Three times a day (TID) | ORAL | 0 refills | Status: DC | PRN

## 2021-11-23 NOTE — Progress Notes (Signed)
Nursing Pain Medication Assessment:  ?Safety precautions to be maintained throughout the outpatient stay will include: orient to surroundings, keep bed in low position, maintain call bell within reach at all times, provide assistance with transfer out of bed and ambulation.  ?Medication Inspection Compliance: Pill count conducted under aseptic conditions, in front of the patient. Neither the pills nor the bottle was removed from the patient's sight at any time. Once count was completed pills were immediately returned to the patient in their original bottle. ? ?Medication: Oxycodone IR ?Pill/Patch Count:  35 of 90 pills remain ?Pill/Patch Appearance: Markings consistent with prescribed medication ?Bottle Appearance: Standard pharmacy container. Clearly labeled. ?Filled Date: 3 / 44 / 2023 ?Last Medication intake:  TodaySafety precautions to be maintained throughout the outpatient stay will include: orient to surroundings, keep bed in low position, maintain call bell within reach at all times, provide assistance with transfer out of bed and ambulation.  ?

## 2021-11-23 NOTE — Patient Instructions (Signed)
____________________________________________________________________________________________ ? ?Medication Rules ? ?Purpose: To inform patients, and their family members, of our rules and regulations. ? ?Applies to: All patients receiving prescriptions (written or electronic). ? ?Pharmacy of record: Pharmacy where electronic prescriptions will be sent. If written prescriptions are taken to a different pharmacy, please inform the nursing staff. The pharmacy listed in the electronic medical record should be the one where you would like electronic prescriptions to be sent. ? ?Electronic prescriptions: In compliance with the [place] Strengthen Opioid Misuse Prevention (STOP) Act of 2017 (Session Law 2017-74/H243), effective August 14, 2018, all controlled substances must be electronically prescribed. Calling prescriptions to the pharmacy will cease to exist. ? ?Prescription refills: Only during scheduled appointments. Applies to all prescriptions. ? ?NOTE: The following applies primarily to controlled substances (Opioid* Pain Medications).  ? ?Type of encounter (visit): For patients receiving controlled substances, face-to-face visits are required. (Not an option or up to the patient.) ? ?Patient's responsibilities: ?Pain Pills: Bring all pain pills to every appointment (except for procedure appointments). ?Pill Bottles: Bring pills in original pharmacy bottle. Always bring the newest bottle. Bring bottle, even if empty. ?Medication refills: You are responsible for knowing and keeping track of what medications you take and those you need refilled. ?The day before your appointment: write a list of all prescriptions that need to be refilled. ?The day of the appointment: give the list to the admitting nurse. Prescriptions will be written only during appointments. No prescriptions will be written on procedure days. ?If you forget a medication: it will not be "Called in", "Faxed", or "electronically sent". You will  need to get another appointment to get these prescribed. ?No early refills. Do not call asking to have your prescription filled early. ?Prescription Accuracy: You are responsible for carefully inspecting your prescriptions before leaving our office. Have the discharge nurse carefully go over each prescription with you, before taking them home. Make sure that your name is accurately spelled, that your address is correct. Check the name and dose of your medication to make sure it is accurate. Check the number of pills, and the written instructions to make sure they are clear and accurate. Make sure that you are given enough medication to last until your next medication refill appointment. ?Taking Medication: Take medication as prescribed. When it comes to controlled substances, taking less pills or less frequently than prescribed is permitted and encouraged. ?Never take more pills than instructed. ?Never take medication more frequently than prescribed.  ?Inform other Doctors: Always inform, all of your healthcare providers, of all the medications you take. ?Pain Medication from other Providers: You are not allowed to accept any additional pain medication from any other Doctor or Healthcare provider. There are two exceptions to this rule. (see below) In the event that you require additional pain medication, you are responsible for notifying us, as stated below. ?Cough Medicine: Often these contain an opioid, such as codeine or hydrocodone. Never accept or take cough medicine containing these opioids if you are already taking an opioid* medication. The combination may cause respiratory failure and death. ?Medication Agreement: You are responsible for carefully reading and following our Medication Agreement. This must be signed before receiving any prescriptions from our practice. Safely store a copy of your signed Agreement. Violations to the Agreement will result in no further prescriptions. (Additional copies of our  Medication Agreement are available upon request.) ?Laws, Rules, & Regulations: All patients are expected to follow all Federal and State Laws, Statutes, Rules, & Regulations. Ignorance of   the Laws does not constitute a valid excuse.  ?Illegal drugs and Controlled Substances: The use of illegal substances (including, but not limited to marijuana and its derivatives) and/or the illegal use of any controlled substances is strictly prohibited. Violation of this rule may result in the immediate and permanent discontinuation of any and all prescriptions being written by our practice. The use of any illegal substances is prohibited. ?Adopted CDC guidelines & recommendations: Target dosing levels will be at or below 60 MME/day. Use of benzodiazepines** is not recommended. ? ?Exceptions: There are only two exceptions to the rule of not receiving pain medications from other Healthcare Providers. ?Exception #1 (Emergencies): In the event of an emergency (i.e.: accident requiring emergency care), you are allowed to receive additional pain medication. However, you are responsible for: As soon as you are able, call our office [phone], at any time of the day or night, and leave a message stating your name, the date and nature of the emergency, and the name and dose of the medication prescribed. In the event that your call is answered by a member of our staff, make sure to document and save the date, time, and the name of the person that took your information.  ?Exception #2 (Planned Surgery): In the event that you are scheduled by another doctor or dentist to have any type of surgery or procedure, you are allowed (for a period no longer than 30 days), to receive additional pain medication, for the acute post-op pain. However, in this case, you are responsible for picking up a copy of our "Post-op Pain Management for Surgeons" handout, and giving it to your surgeon or dentist. This document is available at our office, and  does not require an appointment to obtain it. Simply go to our office during business hours (Monday-Thursday from 8:00 AM to 4:00 PM) (Friday 8:00 AM to 12:00 Noon) or if you have a scheduled appointment with us, prior to your surgery, and ask for it by name. In addition, you are responsible for: calling our office [phone], at any time of the day or night, and leaving a message stating your name, name of your surgeon, type of surgery, and date of procedure or surgery. Failure to comply with your responsibilities may result in termination of therapy involving the controlled substances. ?Medication Agreement Violation. Following the above rules, including your responsibilities will help you in avoiding a Medication Agreement Violation (?Breaking your Pain Medication Contract?). ? ?*Opioid medications include: morphine, codeine, oxycodone, oxymorphone, hydrocodone, hydromorphone, meperidine, tramadol, tapentadol, buprenorphine, fentanyl, methadone. ?**Benzodiazepine medications include: diazepam (Valium), alprazolam (Xanax), clonazepam (Klonopine), lorazepam (Ativan), clorazepate (Tranxene), chlordiazepoxide (Librium), estazolam (Prosom), oxazepam (Serax), temazepam (Restoril), triazolam (Halcion) ?(Last updated: 05/11/2021) ?____________________________________________________________________________________________ ? ____________________________________________________________________________________________ ? ?Medication Recommendations and Reminders ? ?Applies to: All patients receiving prescriptions (written and/or electronic). ? ?Medication Rules & Regulations: These rules and regulations exist for your safety and that of others. They are not flexible and neither are we. Dismissing or ignoring them will be considered "non-compliance" with medication therapy, resulting in complete and irreversible termination of such therapy. (See document titled "Medication Rules" for more details.) In all conscience,  because of safety reasons, we cannot continue providing a therapy where the patient does not follow instructions. ? ?Pharmacy of record:  ?Definition: This is the pharmacy where your electronic prescriptions w

## 2021-11-29 ENCOUNTER — Ambulatory Visit: Payer: Medicare HMO | Admitting: Family Medicine

## 2021-11-30 ENCOUNTER — Ambulatory Visit
Admission: RE | Admit: 2021-11-30 | Discharge: 2021-11-30 | Disposition: A | Discharge status: Home / Self Care | Payer: Medicare HMO | Source: Ambulatory Visit | Attending: Family Medicine | Admitting: Family Medicine

## 2021-11-30 DIAGNOSIS — Z1231 Encounter for screening mammogram for malignant neoplasm of breast: Secondary | ICD-10-CM

## 2021-12-01 ENCOUNTER — Other Ambulatory Visit: Payer: Self-pay | Admitting: Family Medicine

## 2021-12-01 ENCOUNTER — Telehealth: Payer: Self-pay

## 2021-12-01 ENCOUNTER — Ambulatory Visit (INDEPENDENT_AMBULATORY_CARE_PROVIDER_SITE_OTHER): Payer: Medicare HMO | Admitting: Family Medicine

## 2021-12-01 ENCOUNTER — Encounter: Payer: Self-pay | Admitting: Family Medicine

## 2021-12-01 VITALS — BP 116/62 | HR 79 | Temp 97.3°F | Ht 63.0 in | Wt 233.2 lb

## 2021-12-01 DIAGNOSIS — H1013 Acute atopic conjunctivitis, bilateral: Secondary | ICD-10-CM | POA: Diagnosis not present

## 2021-12-01 DIAGNOSIS — J302 Other seasonal allergic rhinitis: Secondary | ICD-10-CM

## 2021-12-01 DIAGNOSIS — R21 Rash and other nonspecific skin eruption: Secondary | ICD-10-CM | POA: Diagnosis not present

## 2021-12-01 DIAGNOSIS — H1010 Acute atopic conjunctivitis, unspecified eye: Secondary | ICD-10-CM | POA: Insufficient documentation

## 2021-12-01 DIAGNOSIS — Z720 Tobacco use: Secondary | ICD-10-CM | POA: Diagnosis not present

## 2021-12-01 MED ORDER — AZELASTINE HCL 0.05 % OP SOLN: 1.0000 [drp] | Freq: Two times a day (BID) | OPHTHALMIC | 3 refills | Status: AC | PRN

## 2021-12-01 MED ORDER — METRONIDAZOLE 1 % EX CREA: TOPICAL_CREAM | Freq: Every day | CUTANEOUS | 1 refills | Status: DC

## 2021-12-01 NOTE — Progress Notes (Signed)
? ?Subjective:  ? ? Patient ID: Teresa Mcgee, female    DOB: 1966/01/23, 56 y.o.   MRN: [id number] ? ?HPI ?Pt presents with eye problems  ? ?Wt Readings from Last 3 Encounters:  ?12/01/21 233 lb 3.2 oz (105.8 kg)  ?11/23/21 235 lb (106.6 kg)  ?11/22/21 235 lb 4 oz (106.7 kg)  ? ?41.31 kg/m? ? ?She was seen on 4/11 by NP Dugal for skin and eye problems  ? ?Noted itchy scaley rash on face under eyes and upper eyelids  ? ?2 weeks prior was tx for bronchitis and still had mild congestion  ?Noted R eye with goopy drainage (no change in vision or pain)  ?Also had been treated for thrush at that time but did not tolerate nystatin solution due to the taste  ? ?Was treated for fungal dermatitis and thrush  ?Px diflucan 100 mg daily for 7 d  ?Lab Results  ?Component Value Date  ? ALT 66 (H) 08/19/2021  ? AST 76 (H) 08/19/2021  ? ALKPHOS 75 08/19/2021  ? BILITOT 0.7 08/19/2021  ? ?Also ofloxacin oph  ? ?Face is clearing up  ?Eyes still bother her= could be the pollen  ? ?Itchy/irritated ?Corners  of her eyes are sore  ?Tearing a lot  ?Discharge is almost tan in color  ?Eyes do appear red  ? ?Runny/stuffy nose  ?Coughs a bit  ?All clear discharge  ?Occ sneezing  ? ?Still has some white on tongue (no change-years and years) ?? Geographic tongue  ? ?Smokes but trying to smoke less (is taking care of someone at night)  ? ?Past xerotic eczema  ? ?Patient Active Problem List  ? Diagnosis Date Noted  ? Allergic conjunctivitis 12/01/2021  ? Elevated LFTs 11/22/2021  ? Medication monitoring encounter 11/22/2021  ? Fungal dermatitis 11/22/2021  ? Acute eruption of skin 11/22/2021  ? Routine general medical examination at a health care facility 08/19/2021  ? Encounter for screening mammogram for breast cancer 08/19/2021  ? Chronic use of opiate for therapeutic purpose 11/04/2020  ? Uncomplicated opioid dependence ([place]) 11/04/2020  ? Ragged cuticle 09/23/2020  ? Type 2 diabetes mellitus without complication, without long-term current  use of insulin ([place]) 03/30/2019  ? Pain due to onychomycosis of toenails of both feet 03/03/2019  ? Morbid obesity with BMI of 45.0-49.9, adult ([place]) 09/16/2018  ? Cervicalgia 04/22/2018  ? Chronic musculoskeletal pain 04/22/2018  ? DDD (degenerative disc disease), thoracic 04/22/2018  ? DDD (degenerative disc disease), cervical 04/22/2018  ? Lumbar facet arthropathy (Bilateral) 04/22/2018  ? Lumbar facet syndrome (Bilateral) 04/22/2018  ? Chronic low back pain (1ry area of Pain) (Bilateral) (L>R) w/o sciatica 04/22/2018  ? Spondylosis without myelopathy or radiculopathy, cervical region 04/22/2018  ? Spondylosis without myelopathy or radiculopathy, lumbosacral region 04/22/2018  ? DDD (degenerative disc disease), lumbosacral 04/22/2018  ? Strain of lumbar paraspinal muscle, sequela 04/22/2018  ? Chronic hip pain (Bilateral) (R>L) 04/22/2018  ? Chronic sacroiliac joint pain (Left) 04/22/2018  ? Neurogenic pain 04/22/2018  ? Osteoarthritis involving multiple joints 04/22/2018  ? Chronic knee pain (Bilateral) (R>L) 04/22/2018  ? Chronic upper extremity pain (4th area of Pain) (Bilateral) (L>R) 02/27/2018  ? Vitamin D deficiency 02/27/2018  ? Chronic low back pain (Bilateral) (L>R) w/ sciatica (Bilateral) 01/24/2018  ? Chronic lower extremity pain (2ry area of Pain) (Bilateral) (L>R) 01/24/2018  ? Chronic neck pain (3ry area of Pain) (Bilateral) (L>R) 01/24/2018  ? Chronic pain syndrome 01/24/2018  ? Long term current use of  opiate analgesic 01/24/2018  ? Pharmacologic therapy 01/24/2018  ? Disorder of skeletal system 01/24/2018  ? Problems influencing health status 01/24/2018  ? Osteopathic Exam Findings (Somatic Dysfunction) 04/25/2012  ? Constipation 01/11/2012  ? Chest pain 01/11/2012  ? 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 Numbness and tingling in both hands 08/25/2011  ? 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 Muscle spasms of neck 05/23/2011  ? Nasal mucositis (ulcerative) 05/23/2011  ? Fatigue 03/21/2011  ? Tobacco  abuse 05/24/2010  ? Hypothyroidism 01/25/2010  ? HYPERTENSION, BENIGN ESSENTIAL 01/25/2010  ? Hyperlipidemia associated with type 2 diabetes mellitus ([place]) 12/31/2009  ? DEPRESSION 12/31/2009  ? GERD 12/31/2009  ? Sleep apnea 12/31/2009  ? ?Past Medical History:  ?Diagnosis Date  ? Arthritis   ? Bell's palsy may 2012  ? Chest pain   ? Diabetes mellitus without complication ([place])   ? GERD (gastroesophageal reflux disease)   ? H/O hiatal hernia   ? Hyperlipidemia   ? Hypertension   ? Hypothyroidism   ? Shingles   ? Shortness of breath   ? Sleep apnea   ? uses cpap  ? ?Past Surgical History:  ?Procedure Laterality Date  ? ABDOMINAL HYSTERECTOMY    ? CESAREAN SECTION    ? cesarian    ? 3x  ? COLONOSCOPY WITH PROPOFOL N/A 11/27/2019  ? Procedure: COLONOSCOPY WITH PROPOFOL;  Surgeon: Jonathon Bellows, MD;  Location: [facility] ENDOSCOPY;  Service: Gastroenterology;  Laterality: N/A;  ? ESOPHAGOGASTRODUODENOSCOPY (EGD) WITH PROPOFOL N/A 07/20/2020  ? Procedure: ESOPHAGOGASTRODUODENOSCOPY (EGD) WITH PROPOFOL;  Surgeon: Jonathon Bellows, MD;  Location: [facility] ENDOSCOPY;  Service: Gastroenterology;  Laterality: N/A;  ? PARTIAL HYSTERECTOMY  2000  ? abnormal uterine bleeding  ? THYROIDECTOMY  1998  ? TOOTH EXTRACTION    ? TUBAL LIGATION    ? ?Social History  ? ?Tobacco Use  ? Smoking status: Every Day  ?  Packs/day: 0.50  ?  Years: 20.00  ?  Pack years: 10.00  ?  Types: Cigarettes  ? Smokeless tobacco: Never  ? Tobacco comments:  ?  2-3 cigs daily-09/22/2020  ?Vaping Use  ? Vaping Use: Never used  ?Substance Use Topics  ? Alcohol use: No  ? Drug use: No  ? ?Family History  ?Problem Relation Age of Onset  ? Heart attack Father 11  ?     deceased  ? Diabetes Brother   ? Stroke Brother 25  ?     twice  ? Stroke Sister 60  ? Diabetes Sister   ? Heart attack Sister 47  ? Heart attack Brother 46  ? Breast cancer Maternal Aunt   ? ?Allergies  ?Allergen Reactions  ? Statins   ?  myopathy  ? ?Current Outpatient Medications on File Prior to Visit   ?Medication Sig Dispense Refill  ? Accu-Chek FastClix Lancets MISC USE UP TO 4 TIMES DAILY AS DIRECTED 102 each 1  ? ACCU-CHEK GUIDE test strip USE UP TO 4 TIMES DAILY AS DIRECTED 100 strip 5  ? albuterol (VENTOLIN HFA) 108 (90 Base) MCG/ACT inhaler INHALE 2 PUFFS INTO THE LUNGS EVERY 4 HOURS AS NEEDED FOR (COUGH, SHORTNESS OF BREATH OR WHEEZING.). 18 g 2  ? amLODipine (NORVASC) 10 MG tablet TAKE 1 TABLET BY MOUTH EVERY DAY 90 tablet 1  ? Bempedoic Acid (NEXLETOL) 180 MG TABS TAKE 1 TABLET BY MOUTH EVERY DAY 90 tablet 3  ? blood glucose meter kit and supplies Dispense based on patient and insurance preference. Use up to four times daily as  directed. (FOR ICD-10 E10.9, E11.9). 1 each 0  ? [employer] D3 125 MCG (5000 UT) capsule TAKE 1 CAPSULE BY MOUTH DAILY WITH BREAKFAST. TAKE ALONG WITH CALCIUM AND MAGNESIUM.  5  ? ezetimibe (ZETIA) 10 MG tablet TAKE 1 TABLET BY MOUTH EVERY DAY 90 tablet 1  ? fluticasone (FLONASE) 50 MCG/ACT nasal spray SPRAY 2 SPRAYS INTO EACH NOSTRIL EVERY DAY 48 mL 3  ? gabapentin (NEURONTIN) 100 MG capsule TAKE 1 CAPSULE (100 MG TOTAL) BY MOUTH 2 (TWO) TIMES DAILY AND 3 CAPSULES (300 MG TOTAL) AT BEDTIME. 150 capsule 2  ? hydrochlorothiazide (HYDRODIURIL) 25 MG tablet TAKE 1 TABLET (25 MG TOTAL) BY MOUTH DAILY. AS DIRECTED 90 tablet 0  ? levocetirizine (XYZAL) 5 MG tablet TAKE 1 TABLET BY MOUTH EVERY DAY IN THE EVENING 90 tablet 1  ? levothyroxine (SYNTHROID) 112 MCG tablet TAKE 1 TABLET BY MOUTH EVERY DAY 90 tablet 1  ? Magnesium Oxide 500 MG CAPS TAKE 1 CAPSULE (500 MG TOTAL) BY MOUTH 2 (TWO) TIMES DAILY AT 8 AM AND 10 PM. 180 capsule 1  ? meloxicam (MOBIC) 15 MG tablet TAKE 1 TABLET BY MOUTH EVERY DAY WITH FOOD AS NEEDED FOR PAIN 90 tablet 1  ? metFORMIN (GLUCOPHAGE) 500 MG tablet TAKE 1 TABLET BY MOUTH 2 TIMES DAILY WITH A MEAL. 180 tablet 1  ? nystatin (MYCOSTATIN) 100000 UNIT/ML suspension Take 5 mLs (500,000 Units total) by mouth in the morning, at noon, and at bedtime. 120 mL 0  ? Olopatadine  HCl 0.2 % SOLN INSTILL 1 DROP INTO BOTH EYES EVERY DAY AS NEEDED    ? [START ON 12/06/2021] oxyCODONE (OXY IR/ROXICODONE) 5 MG immediate release tablet Take 1 tablet (5 mg total) by mouth every 8 (eight) hours as

## 2021-12-01 NOTE — Assessment & Plan Note (Signed)
Reviewed last eval and pictures ?Some improvement with diflucan ?Given appearance and area (around mouth and under eyes) consider poss of oral dermatitis  ?Will tx with metronidazole cream and update  ?Enc to avoid allergens and sun if possible  ? ?Update if not starting to improve in a week or if worsening   ?

## 2021-12-01 NOTE — Assessment & Plan Note (Signed)
Conjunctivitis is now bilateral and consistent with allergic type (no imp with ofloxacin) ?Rev last note ?Px optivar which she has worked before  ?Disc allergen avoidance ?Update if not starting to improve in a week or if worsening   ?

## 2021-12-01 NOTE — Assessment & Plan Note (Signed)
Disc in detail risks of smoking and possible outcomes including copd, vascular/ heart disease, cancer , respiratory and sinus infections  ?Pt voices understanding ?Not ready to quit but feels she can cut down  ?

## 2021-12-01 NOTE — Patient Instructions (Signed)
Try the new eye drops for allergies (generic for optivar)  ? ?Also try the topical metronidazole for face daily /affected areas  ? ? ?Stay out of the pollen when you can  ? ? ?Update if not starting to improve in a week or if worsening    ?

## 2021-12-01 NOTE — Telephone Encounter (Signed)
See note on Rx from pharmacy it says: ? ?Alternative Requested:THIS MEDICATION IS OVER $2000 ON INSURANCE. PLEASE PROVIDE ALTERNATIVE ?

## 2021-12-01 NOTE — Telephone Encounter (Signed)
Pt called for 2 reasons, one being that the cream metronidazole is not covered by insurance and is very expensive. Pt wondering if there is an alternative.  ?Also, pt thought Dr. Glori Bickers was going to send in hydroxyzine for the itching that pt is having.  ? ?Please advise. ?

## 2021-12-02 MED ORDER — HYDROXYZINE HCL 10 MG PO TABS: 10.0000 mg | ORAL_TABLET | Freq: Three times a day (TID) | ORAL | 1 refills | Status: DC | PRN

## 2021-12-02 NOTE — Telephone Encounter (Signed)
Pt notified Rx's sent to pharmacy. 

## 2021-12-02 NOTE — Telephone Encounter (Signed)
Spoke with pharmacy and alt is the med they sent over, they said for some reason the 1% is still name brand the the 0.75 % is not name brand and it's covered, they request you send that Rx in if appropriate.  ? ? ?Also see pt's other phone message regarding hydroxyzine  ?

## 2021-12-02 NOTE — Telephone Encounter (Signed)
See refill request regarding the metronidazole  ?

## 2021-12-02 NOTE — Telephone Encounter (Signed)
I sent the new cream and the hydroxyzine to the pharmacy ?Hope they help! ?

## 2021-12-12 ENCOUNTER — Ambulatory Visit (INDEPENDENT_AMBULATORY_CARE_PROVIDER_SITE_OTHER): Payer: Medicare HMO | Admitting: *Deleted

## 2021-12-12 ENCOUNTER — Other Ambulatory Visit: Payer: Self-pay | Admitting: Family Medicine

## 2021-12-12 DIAGNOSIS — Z0000 Encounter for general adult medical examination without abnormal findings: Secondary | ICD-10-CM | POA: Diagnosis not present

## 2021-12-12 NOTE — Progress Notes (Signed)
? ?Subjective:  ? Teresa Mcgee is a 56 y.o. female who presents for Medicare Annual (Subsequent) preventive examination. ? ?I connected with  [name] on 12/12/21 by a telephone enabled telemedicine application and verified that I am speaking with the correct person using two identifiers. ?  ?I discussed the limitations of evaluation and management by telemedicine. The patient expressed understanding and agreed to proceed. ? ?Patient location: home ? ?Provider location: Tele-Health not in office ? ? ? ?Review of Systems    ? ?Cardiac Risk Factors include: advanced age (>9mn, >>75women);diabetes mellitus;obesity (BMI >30kg/m2);hypertension;smoking/ tobacco exposure ? ?   ?Objective:  ?  ?Today's Vitals  ? 12/12/21 1133  ?PainSc: 4   ? ?There is no height or weight on file to calculate BMI. ? ? ?  12/12/2021  ? 11:36 AM 10/29/2021  ?  4:08 PM 12/08/2020  ? 11:27 AM 08/11/2020  ?  2:04 PM 07/20/2020  ?  8:46 AM 11/28/2019  ?  2:48 PM 11/27/2019  ?  9:08 AM  ?Advanced Directives  ?Does Patient Have a Medical Advance Directive? No No No No No No No  ?Would patient like information on creating a medical advance directive? No - Patient declined  No - Patient declined No - Patient declined  No - Patient declined No - Patient declined  ? ? ?Current Medications (verified) ?Outpatient Encounter Medications as of 12/12/2021  ?Medication Sig  ? Accu-Chek FastClix Lancets MISC USE UP TO 4 TIMES DAILY AS DIRECTED  ? ACCU-CHEK GUIDE test strip USE UP TO 4 TIMES DAILY AS DIRECTED  ? albuterol (VENTOLIN HFA) 108 (90 Base) MCG/ACT inhaler INHALE 2 PUFFS INTO THE LUNGS EVERY 4 HOURS AS NEEDED FOR (COUGH, SHORTNESS OF BREATH OR WHEEZING.).  ? amLODipine (NORVASC) 10 MG tablet TAKE 1 TABLET BY MOUTH EVERY DAY  ? azelastine (OPTIVAR) 0.05 % ophthalmic solution Place 1 drop into both eyes 2 (two) times daily as needed.  ? Bempedoic Acid (NEXLETOL) 180 MG TABS TAKE 1 TABLET BY MOUTH EVERY DAY  ? blood glucose meter kit and supplies  Dispense based on patient and insurance preference. Use up to four times daily as directed. (FOR ICD-10 E10.9, E11.9).  ? [facility] D3 125 MCG (5000 UT) capsule TAKE 1 CAPSULE BY MOUTH DAILY WITH BREAKFAST. TAKE ALONG WITH CALCIUM AND MAGNESIUM.  ? ezetimibe (ZETIA) 10 MG tablet TAKE 1 TABLET BY MOUTH EVERY DAY  ? fluticasone (FLONASE) 50 MCG/ACT nasal spray SPRAY 2 SPRAYS INTO EACH NOSTRIL EVERY DAY  ? gabapentin (NEURONTIN) 100 MG capsule TAKE 1 CAPSULE (100 MG TOTAL) BY MOUTH 2 (TWO) TIMES DAILY AND 3 CAPSULES (300 MG TOTAL) AT BEDTIME.  ? hydrochlorothiazide (HYDRODIURIL) 25 MG tablet TAKE 1 TABLET (25 MG TOTAL) BY MOUTH DAILY. AS DIRECTED  ? hydrOXYzine (ATARAX) 10 MG tablet Take 1 tablet (10 mg total) by mouth 3 (three) times daily as needed for itching. Caution of sedation  ? levocetirizine (XYZAL) 5 MG tablet TAKE 1 TABLET BY MOUTH EVERY DAY IN THE EVENING  ? levothyroxine (SYNTHROID) 112 MCG tablet TAKE 1 TABLET BY MOUTH EVERY DAY  ? Magnesium Oxide 500 MG CAPS TAKE 1 CAPSULE (500 MG TOTAL) BY MOUTH 2 (TWO) TIMES DAILY AT 8 AM AND 10 PM.  ? meloxicam (MOBIC) 15 MG tablet TAKE 1 TABLET BY MOUTH EVERY DAY WITH FOOD AS NEEDED FOR PAIN  ? metFORMIN (GLUCOPHAGE) 500 MG tablet TAKE 1 TABLET BY MOUTH 2 TIMES DAILY WITH A MEAL.  ? metroNIDAZOLE (METROCREAM)  0.75 % cream Apply topically daily. To affected areas  ? nystatin (MYCOSTATIN) 100000 UNIT/ML suspension Take 5 mLs (500,000 Units total) by mouth in the morning, at noon, and at bedtime.  ? Olopatadine HCl 0.2 % SOLN INSTILL 1 DROP INTO BOTH EYES EVERY DAY AS NEEDED  ? oxyCODONE (OXY IR/ROXICODONE) 5 MG immediate release tablet Take 1 tablet (5 mg total) by mouth every 8 (eight) hours as needed for severe pain. Must last 30 days  ? [START ON 01/05/2022] oxyCODONE (OXY IR/ROXICODONE) 5 MG immediate release tablet Take 1 tablet (5 mg total) by mouth every 8 (eight) hours as needed for severe pain. Must last 30 days  ? [START ON 02/04/2022] oxyCODONE (OXY IR/ROXICODONE) 5  MG immediate release tablet Take 1 tablet (5 mg total) by mouth every 8 (eight) hours as needed for severe pain. Must last 30 days  ? REFRESH CELLUVISC 1 % GEL APPLY 1 DROP TO EYE 3 (THREE) TIMES DAILY.  ? rosuvastatin (CRESTOR) 5 MG tablet Take 1 tablet (5 mg total) by mouth every other day.  ? pantoprazole (PROTONIX) 40 MG tablet Take 1 tablet (40 mg total) by mouth daily.  ? tiZANidine (ZANAFLEX) 4 MG tablet Take 1 tablet (4 mg total) by mouth every 8 (eight) hours as needed for muscle spasms.  ? [DISCONTINUED] albuterol (VENTOLIN HFA) 108 (90 Base) MCG/ACT inhaler INHALE 2 PUFFS INTO THE LUNGS EVERY 4 HOURS AS NEEDED FOR (COUGH, SHORTNESS OF BREATH OR WHEEZING.).  ? ?No facility-administered encounter medications on file as of 12/12/2021.  ? ? ?Allergies (verified) ?Statins  ? ?History: ?Past Medical History:  ?Diagnosis Date  ? Arthritis   ? Bell's palsy may 2012  ? Chest pain   ? Diabetes mellitus without complication ([place])   ? GERD (gastroesophageal reflux disease)   ? H/O hiatal hernia   ? Hyperlipidemia   ? Hypertension   ? Hypothyroidism   ? Shingles   ? Shortness of breath   ? Sleep apnea   ? uses cpap  ? ?Past Surgical History:  ?Procedure Laterality Date  ? ABDOMINAL HYSTERECTOMY    ? CESAREAN SECTION    ? cesarian    ? 3x  ? COLONOSCOPY WITH PROPOFOL N/A 11/27/2019  ? Procedure: COLONOSCOPY WITH PROPOFOL;  Surgeon: Jonathon Bellows, MD;  Location: [facility] ENDOSCOPY;  Service: Gastroenterology;  Laterality: N/A;  ? ESOPHAGOGASTRODUODENOSCOPY (EGD) WITH PROPOFOL N/A 07/20/2020  ? Procedure: ESOPHAGOGASTRODUODENOSCOPY (EGD) WITH PROPOFOL;  Surgeon: Jonathon Bellows, MD;  Location[facility] ENDOSCOPY;  Service: Gastroenterology;  Laterality: N/A;  ? PARTIAL HYSTERECTOMY  2000  ? abnormal uterine bleeding  ? THYROIDECTOMY  1998  ? TOOTH EXTRACTION    ? TUBAL LIGATION    ? ?Family History  ?Problem Relation Age of Onset  ? Heart attack Father 41  ?     deceased  ? Diabetes Brother   ? Stroke Brother 25  ?     twice  ? Stroke  Sister 68  ? Diabetes Sister   ? Heart attack Sister 13  ? Heart attack Brother 68  ? Breast cancer Maternal Aunt   ? ?Social History  ? ?Socioeconomic History  ? Marital status: Married  ?  Spouse name: Not on file  ? Number of children: Not on file  ? Years of education: Not on file  ? Highest education level: Not on file  ?Occupational History  ? Occupation: disabled  ?  Employer: [employer]  ?  Comment: back  ?Tobacco Use  ? Smoking status: Every  Day  ?  Packs/day: 0.50  ?  Years: 20.00  ?  Pack years: 10.00  ?  Types: Cigarettes  ? Smokeless tobacco: Never  ? Tobacco comments:  ?  2-3 cigs daily-09/22/2020  ?Vaping Use  ? Vaping Use: Never used  ?Substance and Sexual Activity  ? Alcohol use: No  ? Drug use: No  ? Sexual activity: Not Currently  ?Other Topics Concern  ? Not on file  ?Social History Narrative  ? Married but she and husband separated, she helps take care of him (he is in poor health)  ? She lives with her grown son who is mentally disabled  ? She has been on disability since 2011  ? ?Social Determinants of Health  ? ?Financial Resource Strain: Not on file  ?Food Insecurity: No Food Insecurity  ? Worried About Charity fundraiser in the Last Year: Never true  ? Ran Out of Food in the Last Year: Never true  ?Transportation Needs: No Transportation Needs  ? Lack of Transportation (Medical): No  ? Lack of Transportation (Non-Medical): No  ?Physical Activity: Insufficiently Active  ? Days of Exercise per Week: 3 days  ? Minutes of Exercise per Session: 30 min  ?Stress: No Stress Concern Present  ? Feeling of Stress : Only a little  ?Social Connections: Moderately Isolated  ? Frequency of Communication with Friends and Family: More than three times a week  ? Frequency of Social Gatherings with Friends and Family: Twice a week  ? Attends Religious Services: More than 4 times per year  ? Active Member of Clubs or Organizations: No  ? Attends Archivist Meetings: Never  ? Marital Status:  Separated  ? ? ?Tobacco Counseling ?Ready to quit: Not Answered ?Counseling given: Not Answered ?Tobacco comments: 2-3 cigs daily-09/22/2020 ? ? ?Clinical Intake: ? ?Pre-visit preparation completed: Yes ? ?Pain S

## 2021-12-12 NOTE — Patient Instructions (Signed)
[name] , ?Thank you for taking time to come for your Medicare Wellness Visit. I appreciate your ongoing commitment to your health goals. Please review the following plan we discussed and let me know if I can assist you in the future.  ? ?Screening recommendations/referrals: ?Colonoscopy: up to date ?Mammogram: up to date ? ?Recommended yearly ophthalmology/optometry visit for glaucoma screening and checkup ?Recommended yearly dental visit for hygiene and checkup ? ?Vaccinations: ?Influenza vaccine: Education provided ? ?Tdap vaccine: up to date ?Shingles vaccine: Education provided   ? ?Advanced directives: Education provided ? ?Conditions/risks identified:  ? ?Next appointment: 02-21-2022 @ 11:00 [place] ? ? ?Preventive Care 80 Years and Older, Female ?Preventive care refers to lifestyle choices and visits with your health care provider that can promote health and wellness. ?What does preventive care include? ?A yearly physical exam. This is also called an annual well check. ?Dental exams once or twice a year. ?Routine eye exams. Ask your health care provider how often you should have your eyes checked. ?Personal lifestyle choices, including: ?Daily care of your teeth and gums. ?Regular physical activity. ?Eating a healthy diet. ?Avoiding tobacco and drug use. ?Limiting alcohol use. ?Practicing safe sex. ?Taking low-dose aspirin every day. ?Taking vitamin and mineral supplements as recommended by your health care provider. ?What happens during an annual well check? ?The services and screenings done by your health care provider during your annual well check will depend on your age, overall health, lifestyle risk factors, and family history of disease. ?Counseling  ?Your health care provider may ask you questions about your: ?Alcohol use. ?Tobacco use. ?Drug use. ?Emotional well-being. ?Home and relationship well-being. ?Sexual activity. ?Eating habits. ?History of falls. ?Memory and ability to understand  (cognition). ?Work and work Statistician. ?Reproductive health. ?Screening  ?You may have the following tests or measurements: ?Height, weight, and BMI. ?Blood pressure. ?Lipid and cholesterol levels. These may be checked every 5 years, or more frequently if you are over 13 years old. ?Skin check. ?Lung cancer screening. You may have this screening every year starting at age 3 if you have a 30-pack-year history of smoking and currently smoke or have quit within the past 15 years. ?Fecal occult blood test (FOBT) of the stool. You may have this test every year starting at age 65. ?Flexible sigmoidoscopy or colonoscopy. You may have a sigmoidoscopy every 5 years or a colonoscopy every 10 years starting at age 76. ?Hepatitis C blood test. ?Hepatitis B blood test. ?Sexually transmitted disease (STD) testing. ?Diabetes screening. This is done by checking your blood sugar (glucose) after you have not eaten for a while (fasting). You may have this done every 1-3 years. ?Bone density scan. This is done to screen for osteoporosis. You may have this done starting at age 9. ?Mammogram. This may be done every 1-2 years. Talk to your health care provider about how often you should have regular mammograms. ?Talk with your health care provider about your test results, treatment options, and if necessary, the need for more tests. ?Vaccines  ?Your health care provider may recommend certain vaccines, such as: ?Influenza vaccine. This is recommended every year. ?Tetanus, diphtheria, and acellular pertussis (Tdap, Td) vaccine. You may need a Td booster every 10 years. ?Zoster vaccine. You may need this after age 23. ?Pneumococcal 13-valent conjugate (PCV13) vaccine. One dose is recommended after age 20. ?Pneumococcal polysaccharide (PPSV23) vaccine. One dose is recommended after age 89. ?Talk to your health care provider about which screenings and vaccines you need and how  often you need them. ?This information is not intended to  replace advice given to you by your health care provider. Make sure you discuss any questions you have with your health care provider. ?Document Released: 08/27/2015 Document Revised: 04/19/2016 Document Reviewed: 06/01/2015 ?Elsevier Interactive Patient Education ? 2017 [employer]. ? ?Fall Prevention in the Home ?Falls can cause injuries. They can happen to people of all ages. There are many things you can do to make your home safe and to help prevent falls. ?What can I do on the outside of my home? ?Regularly fix the edges of walkways and driveways and fix any cracks. ?Remove anything that might make you trip as you walk through a door, such as a raised step or threshold. ?Trim any bushes or trees on the path to your home. ?Use bright outdoor lighting. ?Clear any walking paths of anything that might make someone trip, such as rocks or tools. ?Regularly check to see if handrails are loose or broken. Make sure that both sides of any steps have handrails. ?Any raised decks and porches should have guardrails on the edges. ?Have any leaves, snow, or ice cleared regularly. ?Use sand or salt on walking paths during winter. ?Clean up any spills in your garage right away. This includes oil or grease spills. ?What can I do in the bathroom? ?Use night lights. ?Install grab bars by the toilet and in the tub and shower. Do not use towel bars as grab bars. ?Use non-skid mats or decals in the tub or shower. ?If you need to sit down in the shower, use a plastic, non-slip stool. ?Keep the floor dry. Clean up any water that spills on the floor as soon as it happens. ?Remove soap buildup in the tub or shower regularly. ?Attach bath mats securely with double-sided non-slip rug tape. ?Do not have throw rugs and other things on the floor that can make you trip. ?What can I do in the bedroom? ?Use night lights. ?Make sure that you have a light by your bed that is easy to reach. ?Do not use any sheets or blankets that are too big for  your bed. They should not hang down onto the floor. ?Have a firm chair that has side arms. You can use this for support while you get dressed. ?Do not have throw rugs and other things on the floor that can make you trip. ?What can I do in the kitchen? ?Clean up any spills right away. ?Avoid walking on wet floors. ?Keep items that you use a lot in easy-to-reach places. ?If you need to reach something above you, use a strong step stool that has a grab bar. ?Keep electrical cords out of the way. ?Do not use floor polish or wax that makes floors slippery. If you must use wax, use non-skid floor wax. ?Do not have throw rugs and other things on the floor that can make you trip. ?What can I do with my stairs? ?Do not leave any items on the stairs. ?Make sure that there are handrails on both sides of the stairs and use them. Fix handrails that are broken or loose. Make sure that handrails are as long as the stairways. ?Check any carpeting to make sure that it is firmly attached to the stairs. Fix any carpet that is loose or worn. ?Avoid having throw rugs at the top or bottom of the stairs. If you do have throw rugs, attach them to the floor with carpet tape. ?Make sure that you have a  light switch at the top of the stairs and the bottom of the stairs. If you do not have them, ask someone to add them for you. ?What else can I do to help prevent falls? ?Wear shoes that: ?Do not have high heels. ?Have rubber bottoms. ?Are comfortable and fit you well. ?Are closed at the toe. Do not wear sandals. ?If you use a stepladder: ?Make sure that it is fully opened. Do not climb a closed stepladder. ?Make sure that both sides of the stepladder are locked into place. ?Ask someone to hold it for you, if possible. ?Clearly mark and make sure that you can see: ?Any grab bars or handrails. ?First and last steps. ?Where the edge of each step is. ?Use tools that help you move around (mobility aids) if they are needed. These  include: ?Canes. ?Walkers. ?Scooters. ?Crutches. ?Turn on the lights when you go into a dark area. Replace any light bulbs as soon as they burn out. ?Set up your furniture so you have a clear path. Avoid moving your furniture around. ?

## 2022-01-21 ENCOUNTER — Other Ambulatory Visit: Payer: Self-pay | Admitting: Family Medicine

## 2022-01-24 ENCOUNTER — Telehealth (INDEPENDENT_AMBULATORY_CARE_PROVIDER_SITE_OTHER): Payer: Medicare HMO | Admitting: Internal Medicine

## 2022-01-24 ENCOUNTER — Other Ambulatory Visit
Admission: RE | Admit: 2022-01-24 | Discharge: 2022-01-24 | Disposition: A | Discharge status: Home / Self Care | Payer: Medicare HMO | Attending: Internal Medicine | Admitting: Internal Medicine

## 2022-01-24 ENCOUNTER — Encounter: Payer: Self-pay | Admitting: Internal Medicine

## 2022-01-24 VITALS — Wt 234.0 lb

## 2022-01-24 DIAGNOSIS — I739 Peripheral vascular disease, unspecified: Secondary | ICD-10-CM

## 2022-01-24 DIAGNOSIS — M791 Myalgia, unspecified site: Secondary | ICD-10-CM

## 2022-01-24 DIAGNOSIS — T466X5D Adverse effect of antihyperlipidemic and antiarteriosclerotic drugs, subsequent encounter: Secondary | ICD-10-CM

## 2022-01-24 DIAGNOSIS — E119 Type 2 diabetes mellitus without complications: Secondary | ICD-10-CM

## 2022-01-24 DIAGNOSIS — Z5181 Encounter for therapeutic drug level monitoring: Secondary | ICD-10-CM | POA: Diagnosis not present

## 2022-01-24 DIAGNOSIS — E785 Hyperlipidemia, unspecified: Secondary | ICD-10-CM | POA: Diagnosis not present

## 2022-01-24 DIAGNOSIS — I10 Essential (primary) hypertension: Secondary | ICD-10-CM

## 2022-01-24 DIAGNOSIS — R7989 Other specified abnormal findings of blood chemistry: Secondary | ICD-10-CM | POA: Insufficient documentation

## 2022-01-24 LAB — HEPATIC FUNCTION PANEL
ALT: 67 U/L — ABNORMAL HIGH (ref 0–44)
AST: 106 U/L — ABNORMAL HIGH (ref 15–41)
Albumin: 4.3 g/dL (ref 3.5–5.0)
Alkaline Phosphatase: 69 U/L (ref 38–126)
Bilirubin, Direct: 0.2 mg/dL (ref 0.0–0.2)
Indirect Bilirubin: 0.7 mg/dL (ref 0.3–0.9)
Total Bilirubin: 0.9 mg/dL (ref 0.3–1.2)
Total Protein: 8.6 g/dL — ABNORMAL HIGH (ref 6.5–8.1)

## 2022-01-24 NOTE — Patient Instructions (Signed)
Medication Instructions:  Your physician recommends that you continue on your current medications as directed. Please refer to the Current Medication list given to you today.  *If you need a refill on your cardiac medications before your next appointment, please call your pharmacy*   Lab Work: FASTING lipid panel NOW  FASTING lipid panel in 6 months - before next visit  If you have labs (blood work) drawn today and your tests are completely normal, you will receive your results only by: [place] (if you have MyChart) OR A paper copy in the mail If you have any lab test that is abnormal or we need to change your treatment, we will call you to review the results.   Testing/Procedures: NONE   Follow-Up: At [facility], you and your health needs are our priority.  As part of our continuing mission to provide you with exceptional heart care, we have created designated Provider Care Teams.  These Care Teams include your primary Cardiologist (physician) and Advanced Practice Providers (APPs -  Physician Assistants and Nurse Practitioners) who all work together to provide you with the care you need, when you need it.  We recommend signing up for the patient portal called "MyChart".  Sign up information is provided on this After Visit Summary.  MyChart is used to connect with patients for Virtual Visits (Telemedicine).  Patients are able to view lab/test results, encounter notes, upcoming appointments, etc.  Non-urgent messages can be sent to your provider as well.   To learn more about what you can do with MyChart, go to [email or web address].    Your next appointment:   6 month(s)  The format for your next appointment:   In Person or Virtual  Provider:   Lyman Bishop MD

## 2022-01-24 NOTE — Progress Notes (Signed)
Virtual Visit via Video Note   This visit type was conducted due to national recommendations for restrictions regarding the COVID-19 Pandemic (e.g. social distancing) in an effort to limit this patient's exposure and mitigate transmission in our community.  Due to her co-morbid illnesses, this patient is at least at moderate risk for complications without adequate follow up.  This format is felt to be most appropriate for this patient at this time.  The patient does have access to video technology  All issues noted in this document were discussed and addressed.  A limited physical exam could be performed with this format.  Please refer to the patient's chart for her  consent to telehealth for [facility].   Date:  01/24/2022   ID:  Teresa Mcgee, DOB Apr 12, 1966, MRN [id number] The patient was identified using 2 identifiers.  Evaluation Performed:  Follow-Up Visit  Patient Location:  [address] [place] [address]  Provider location:   [address], [place], [place] [address]  PCP:  Abner Greenspan, MD  Cardiologist:  None Electrophysiologist:  None   Chief Complaint:  Manage dyslipidemia  History of Present Illness:    Teresa Mcgee is a 56 y.o. female who presents via audio/video conferencing for a telehealth visit today.  This is a pleasant 56 year old female with type 2 diabetes, dyslipidemia, hypertension and history of statin intolerance to both atorvastatin and simvastatin, who presents for evaluation and management of dyslipidemia.  She recently had a lipid profile which demonstrated a total cholesterol of 204, triglycerides 117, HDL 46 and LDL 134.  Hemoglobin A1c is 5.8.  She has no known history of heart disease although she had a brother and father both who had heart disease in their 40s.  A CT scan of the abdomen and pelvis was done along with a CT angio of the chest in October 2019 for shortness of breath and upper abdominal pain, this did  not suggest any coronary artery calcification however there is evidence for "peripheral calcified atherosclerotic plaque".  Based on this, she does need aggressive lipid-lowering and would target an LDL to less than 70.  Unfortunately she has not been able to tolerate statins having myalgias.  She is hesitant to try another statin at this point.  She was placed on ezetimibe however her cholesterol remains above target.  We discussed options today.  11/24/2020  Ms. Mcgee seems to be doing well on Nexletol.  She is tolerating it without any significant side effects.  Repeat labs 6 days ago showed total cholesterol 143, triglycerides 175, HDL 34 and LDL at 79.  This is just slightly above goal although she has been more active recently and has made some dietary changes.  I suspect hopefully over the next several months that her cholesterol will become even lower.  09/19/2021  Teresa Mcgee is seen today in follow-up.  Unfortunately her lipids have gone up in the past 6 months.  We are hopeful that with dietary changes and weight loss she would reach her target on Nexletol and ezetimibe, however her most recent lipids demonstrated total cholesterol 167, triglycerides 164, HDL 34 and LDL 100.  Her goal LDL is less than 70 given PAD.  Her TSH was also a little suppressed therefore her thyroid medication was reduced.  This would actually be usually associated with a lower than expected cholesterol.  Therefore I anticipated going higher.  She will need additional therapy.  01/24/2022  Teresa Mcgee is seen  today in follow-up.  She reports she is tolerating rosuvastatin 5 mg every other day in addition to ezetimibe and Nexletol.  She did not have her lipids reassessed.  She will plan to get them done in [place] in the next few days.  She is wondering if she needs to continue all of them.  Overall I think if she is tolerating it well we could consider combining the Nexletol and Zetia use her pill burden.  The  patient does not have symptoms concerning for COVID-19 infection (fever, chills, cough, or new SHORTNESS OF BREATH).    Prior CV studies:   The following studies were reviewed today:  Chart reviewed, lab work reviewed  PMHx:  Past Medical History:  Diagnosis Date   Arthritis    Bell's palsy may 2012   Chest pain    Diabetes mellitus without complication (HCC)    GERD (gastroesophageal reflux disease)    H/O hiatal hernia    Hyperlipidemia    Hypertension    Hypothyroidism    Shingles    Shortness of breath    Sleep apnea    uses cpap    Past Surgical History:  Procedure Laterality Date   ABDOMINAL HYSTERECTOMY     CESAREAN SECTION     cesarian     3x   COLONOSCOPY WITH PROPOFOL N/A 11/27/2019   Procedure: COLONOSCOPY WITH PROPOFOL;  Surgeon: Jonathon Bellows, MD;  Location: [facility] ENDOSCOPY;  Service: Gastroenterology;  Laterality: N/A;   ESOPHAGOGASTRODUODENOSCOPY (EGD) WITH PROPOFOL N/A 07/20/2020   Procedure: ESOPHAGOGASTRODUODENOSCOPY (EGD) WITH PROPOFOL;  Surgeon: Jonathon Bellows, MD;  Location: [facility] ENDOSCOPY;  Service: Gastroenterology;  Laterality: N/A;   PARTIAL HYSTERECTOMY  2000   abnormal uterine bleeding   THYROIDECTOMY  1998   TOOTH EXTRACTION     TUBAL LIGATION      FAMHx:  Family History  Problem Relation Age of Onset   Heart attack Father 54       deceased   Diabetes Brother    Stroke Brother 16       twice   Stroke Sister 45   Diabetes Sister    Heart attack Sister 22   Heart attack Brother 39   Breast cancer Maternal Aunt     SOCHx:   reports that she has been smoking cigarettes. She has a 10.00 pack-year smoking history. She has never used smokeless tobacco. She reports that she does not drink alcohol and does not use drugs.  ALLERGIES:  Allergies  Allergen Reactions   Statins     myopathy    MEDS:  Current Meds  Medication Sig   Accu-Chek FastClix Lancets MISC USE UP TO 4 TIMES DAILY AS DIRECTED   ACCU-CHEK GUIDE test strip USE UP TO 4  TIMES DAILY AS DIRECTED   albuterol (VENTOLIN HFA) 108 (90 Base) MCG/ACT inhaler INHALE 2 PUFFS INTO THE LUNGS EVERY 4 HOURS AS NEEDED FOR (COUGH, SHORTNESS OF BREATH OR WHEEZING.).   amLODipine (NORVASC) 10 MG tablet TAKE 1 TABLET BY MOUTH EVERY DAY   azelastine (OPTIVAR) 0.05 % ophthalmic solution Place 1 drop into both eyes 2 (two) times daily as needed.   Bempedoic Acid (NEXLETOL) 180 MG TABS TAKE 1 TABLET BY MOUTH EVERY DAY   blood glucose meter kit and supplies Dispense based on patient and insurance preference. Use up to four times daily as directed. (FOR ICD-10 E10.9, E11.9).   [employer] D3 125 MCG (5000 UT) capsule TAKE 1 CAPSULE BY MOUTH DAILY WITH BREAKFAST. TAKE ALONG WITH CALCIUM  AND MAGNESIUM.   ezetimibe (ZETIA) 10 MG tablet TAKE 1 TABLET BY MOUTH EVERY DAY   fluticasone (FLONASE) 50 MCG/ACT nasal spray SPRAY 2 SPRAYS INTO EACH NOSTRIL EVERY DAY   gabapentin (NEURONTIN) 100 MG capsule TAKE 1 CAPSULE (100 MG TOTAL) BY MOUTH 2 (TWO) TIMES DAILY AND 3 CAPSULES (300 MG TOTAL) AT BEDTIME.   hydrochlorothiazide (HYDRODIURIL) 25 MG tablet TAKE 1 TABLET (25 MG TOTAL) BY MOUTH DAILY. AS DIRECTED   hydrOXYzine (ATARAX) 10 MG tablet Take 1 tablet (10 mg total) by mouth 3 (three) times daily as needed for itching. Caution of sedation   levocetirizine (XYZAL) 5 MG tablet TAKE 1 TABLET BY MOUTH EVERY DAY IN THE EVENING   levothyroxine (SYNTHROID) 112 MCG tablet TAKE 1 TABLET BY MOUTH EVERY DAY   Magnesium Oxide 500 MG CAPS TAKE 1 CAPSULE (500 MG TOTAL) BY MOUTH 2 (TWO) TIMES DAILY AT 8 AM AND 10 PM.   meloxicam (MOBIC) 15 MG tablet TAKE 1 TABLET BY MOUTH EVERY DAY WITH FOOD AS NEEDED FOR PAIN   metFORMIN (GLUCOPHAGE) 500 MG tablet TAKE 1 TABLET BY MOUTH 2 TIMES DAILY WITH A MEAL.   metroNIDAZOLE (METROCREAM) 0.75 % cream Apply topically daily. To affected areas   Olopatadine HCl 0.2 % SOLN INSTILL 1 DROP INTO BOTH EYES EVERY DAY AS NEEDED   [START ON 02/04/2022] oxyCODONE (OXY IR/ROXICODONE) 5 MG  immediate release tablet Take 1 tablet (5 mg total) by mouth every 8 (eight) hours as needed for severe pain. Must last 30 days   REFRESH CELLUVISC 1 % GEL APPLY 1 DROP TO EYE 3 (THREE) TIMES DAILY.     ROS: Pertinent items noted in HPI and remainder of comprehensive ROS otherwise negative.  Labs/Other Tests and Data Reviewed:    Recent Labs: 08/19/2021: ALT 66; BUN 12; Creatinine, Ser 1.00; Hemoglobin 13.8; Platelets 188.0; Potassium 3.7; Sodium 139 10/07/2021: TSH 0.56   Recent Lipid Panel Lab Results  Component Value Date/Time   CHOL 167 08/19/2021 11:24 AM   CHOL 143 11/18/2020 10:55 AM   TRIG 164.0 (H) 08/19/2021 11:24 AM   HDL 34.40 (L) 08/19/2021 11:24 AM   HDL 34 (L) 11/18/2020 10:55 AM   CHOLHDL 5 08/19/2021 11:24 AM   LDLCALC 100 (H) 08/19/2021 11:24 AM   LDLCALC 79 11/18/2020 10:55 AM   LDLCALC 159 (H) 11/16/2017 03:29 PM    Wt Readings from Last 3 Encounters:  01/24/22 234 lb (106.1 kg)  12/01/21 233 lb 3.2 oz (105.8 kg)  11/23/21 235 lb (106.6 kg)     Exam:    Vital Signs:  Wt 234 lb (106.1 kg)   LMP  (LMP Unknown)   BMI 41.45 kg/m    General appearance: alert, no distress and morbidly obese Lungs: No visual respiratory difficulty Abdomen: Obese Extremities: extremities normal, atraumatic, no cyanosis or edema Skin: Skin color, texture, turgor normal. No rashes or lesions Neurologic: Grossly normal Psych: Pleasant  ASSESSMENT & PLAN:    Mixed dyslipidemia, goal LDL less than 70 Type 2 diabetes Hypertension Family history of premature coronary disease PAD with calcified peripheral atherosclerosis Statin intolerant-myalgias  Ms. Mcgee seems to be tolerating low-dose rosuvastatin 5 mg every other day in addition to her Zetia and Nexletol.  We may consider combining those 2 medicines to reduce pill burden but likely she will need to remain on all 3 medications to reach her target LDL less than 70.  She will have labs done in the next few days and I will  contact  her with those results.  Follow-up with me virtually in 6 months or sooner as necessary.  COVID-19 Education: The signs and symptoms of COVID-19 were discussed with the patient and how to seek care for testing (follow up with PCP or arrange E-visit).  The importance of social distancing was discussed today.  Patient Risk:   After full review of this patients clinical status, I feel that they are at least moderate risk at this time.  Time:   Today, I have spent 25 minutes with the patient with telehealth technology discussing dyslipidemia, goal LDL cholesterol.     Medication Adjustments/Labs and Tests Ordered: Current medicines are reviewed at length with the patient today.  Concerns regarding medicines are outlined above.   Tests Ordered: No orders of the defined types were placed in this encounter.   Medication Changes: No orders of the defined types were placed in this encounter.   Disposition:  in 6 month(s)  Pixie Casino, MD, [facility], [place] Director of the Advanced Lipid Disorders &  Cardiovascular Risk Reduction Clinic Diplomate of the American Board of Clinical Lipidology Attending Cardiologist  Direct Dial: [phone]  Fax: [phone]  Website:  [email or web address]  Pixie Casino, MD  01/24/2022 9:07 AM

## 2022-01-25 ENCOUNTER — Telehealth: Payer: Self-pay | Admitting: Family Medicine

## 2022-01-25 DIAGNOSIS — R7989 Other specified abnormal findings of blood chemistry: Secondary | ICD-10-CM

## 2022-01-25 NOTE — Telephone Encounter (Signed)
-----   Message from Barkley Bruns, [place] sent at 01/25/2022  2:48 PM EDT ----- Called patient reviewed all information and repeated back to me. Will call if any questions. Pt does not drink any alcohol, and she said a US of the liver would be fine with her. She wants it to be done in [place].

## 2022-01-25 NOTE — Telephone Encounter (Signed)
I placed referral for the ultrasound  If you don't hear in 1-2 wk let us know

## 2022-01-26 NOTE — Telephone Encounter (Signed)
Called patient let know information. Will give [place] a call if not received appointment date in 2 weeks.

## 2022-01-30 ENCOUNTER — Encounter: Payer: Self-pay | Admitting: Podiatry

## 2022-01-30 ENCOUNTER — Ambulatory Visit (INDEPENDENT_AMBULATORY_CARE_PROVIDER_SITE_OTHER): Payer: Medicare HMO | Admitting: Podiatry

## 2022-01-30 DIAGNOSIS — M79675 Pain in left toe(s): Secondary | ICD-10-CM

## 2022-01-30 DIAGNOSIS — B351 Tinea unguium: Secondary | ICD-10-CM

## 2022-01-30 DIAGNOSIS — M79674 Pain in right toe(s): Secondary | ICD-10-CM | POA: Diagnosis not present

## 2022-01-30 DIAGNOSIS — E119 Type 2 diabetes mellitus without complications: Secondary | ICD-10-CM

## 2022-01-30 NOTE — Progress Notes (Signed)
This patient returns to my office for at risk foot care.  This patient requires this care by a professional since this patient will be at risk due to having diabetes.  This patient is unable to cut nails herself since the patient cannot reach her nails.These nails are painful walking and wearing shoes.  This patient presents for at risk foot care today.  General Appearance  Alert, conversant and in no acute stress.  Vascular  Dorsalis pedis   pulses are palpable  bilaterally.   Posterior tibial pulses are absent  B/L. Capillary return is within normal limits  bilaterally. Cold feet   Bilaterally.  Absent hair  B/L.  Neurologic  Senn-Weinstein monofilament wire test within normal limits  bilaterally. Muscle power within normal limits bilaterally.  Nails Thick disfigured discolored nails with subungual debris  from hallux to fifth toes bilaterally. No evidence of bacterial infection or drainage bilaterally.  Orthopedic  No limitations of motion  feet .  No crepitus or effusions noted.  No bony pathology or digital deformities noted.  Skin  normotropic skin with no porokeratosis noted bilaterally.  No signs of infections or ulcers noted.     Onychomycosis  Pain in right toes  Pain in left toes  Consent was obtained for treatment procedures.   Mechanical debridement of nails 1-5  bilaterally performed with a nail nipper.  Filed with dremel without incident.    Return office visit   3 months                   Told patient to return for periodic foot care and evaluation due to potential at risk complications.   Jeston Junkins DPM  

## 2022-02-02 ENCOUNTER — Ambulatory Visit
Admission: RE | Admit: 2022-02-02 | Discharge: 2022-02-02 | Disposition: A | Discharge status: Home / Self Care | Payer: Medicare HMO | Source: Ambulatory Visit | Attending: Family Medicine | Admitting: Family Medicine

## 2022-02-02 DIAGNOSIS — R7989 Other specified abnormal findings of blood chemistry: Secondary | ICD-10-CM

## 2022-02-06 ENCOUNTER — Telehealth: Payer: Self-pay | Admitting: Family Medicine

## 2022-02-06 DIAGNOSIS — K760 Fatty (change of) liver, not elsewhere classified: Secondary | ICD-10-CM | POA: Insufficient documentation

## 2022-02-06 DIAGNOSIS — R7989 Other specified abnormal findings of blood chemistry: Secondary | ICD-10-CM

## 2022-02-14 ENCOUNTER — Other Ambulatory Visit: Payer: Self-pay | Admitting: Family Medicine

## 2022-02-16 ENCOUNTER — Other Ambulatory Visit: Payer: Self-pay | Admitting: Family Medicine

## 2022-02-16 DIAGNOSIS — I10 Essential (primary) hypertension: Secondary | ICD-10-CM

## 2022-02-17 ENCOUNTER — Other Ambulatory Visit: Payer: Self-pay | Admitting: Family Medicine

## 2022-02-17 DIAGNOSIS — J302 Other seasonal allergic rhinitis: Secondary | ICD-10-CM

## 2022-02-17 DIAGNOSIS — I10 Essential (primary) hypertension: Secondary | ICD-10-CM

## 2022-02-21 ENCOUNTER — Encounter: Payer: Self-pay | Admitting: Family Medicine

## 2022-02-21 ENCOUNTER — Ambulatory Visit (INDEPENDENT_AMBULATORY_CARE_PROVIDER_SITE_OTHER): Payer: Medicare HMO | Admitting: Family Medicine

## 2022-02-21 VITALS — BP 128/82 | HR 71 | Ht 63.0 in | Wt 232.6 lb

## 2022-02-21 DIAGNOSIS — E1169 Type 2 diabetes mellitus with other specified complication: Secondary | ICD-10-CM | POA: Diagnosis not present

## 2022-02-21 DIAGNOSIS — Z6841 Body Mass Index (BMI) 40.0 and over, adult: Secondary | ICD-10-CM | POA: Diagnosis not present

## 2022-02-21 DIAGNOSIS — E039 Hypothyroidism, unspecified: Secondary | ICD-10-CM | POA: Diagnosis not present

## 2022-02-21 DIAGNOSIS — E1165 Type 2 diabetes mellitus with hyperglycemia: Secondary | ICD-10-CM

## 2022-02-21 DIAGNOSIS — E785 Hyperlipidemia, unspecified: Secondary | ICD-10-CM

## 2022-02-21 DIAGNOSIS — E876 Hypokalemia: Secondary | ICD-10-CM | POA: Insufficient documentation

## 2022-02-21 DIAGNOSIS — I10 Essential (primary) hypertension: Secondary | ICD-10-CM

## 2022-02-21 DIAGNOSIS — E119 Type 2 diabetes mellitus without complications: Secondary | ICD-10-CM

## 2022-02-21 LAB — COMPREHENSIVE METABOLIC PANEL WITH GFR
ALT: 73 U/L — ABNORMAL HIGH (ref 0–35)
AST: 112 U/L — ABNORMAL HIGH (ref 0–37)
Albumin: 4.7 g/dL (ref 3.5–5.2)
Alkaline Phosphatase: 76 U/L (ref 39–117)
BUN: 11 mg/dL (ref 6–23)
CO2: 31 meq/L (ref 19–32)
Calcium: 10.5 mg/dL (ref 8.4–10.5)
Chloride: 99 meq/L (ref 96–112)
Creatinine, Ser: 0.93 mg/dL (ref 0.40–1.20)
GFR: 69.16 mL/min
Glucose, Bld: 99 mg/dL (ref 70–99)
Potassium: 3.4 meq/L — ABNORMAL LOW (ref 3.5–5.1)
Sodium: 141 meq/L (ref 135–145)
Total Bilirubin: 0.4 mg/dL (ref 0.2–1.2)
Total Protein: 7.9 g/dL (ref 6.0–8.3)

## 2022-02-21 LAB — LIPID PANEL
Cholesterol: 111 mg/dL (ref 0–200)
HDL: 36.5 mg/dL — ABNORMAL LOW
LDL Cholesterol: 47 mg/dL (ref 0–99)
NonHDL: 74.06
Total CHOL/HDL Ratio: 3
Triglycerides: 137 mg/dL (ref 0.0–149.0)
VLDL: 27.4 mg/dL (ref 0.0–40.0)

## 2022-02-21 LAB — HEMOGLOBIN A1C: Hgb A1c MFr Bld: 6.3 % (ref 4.6–6.5)

## 2022-02-21 LAB — MAGNESIUM: Magnesium: 1.7 mg/dL (ref 1.5–2.5)

## 2022-02-21 MED ORDER — METFORMIN HCL 500 MG PO TABS: 500.0000 mg | ORAL_TABLET | Freq: Two times a day (BID) | ORAL | 3 refills | Status: DC

## 2022-02-21 NOTE — Patient Instructions (Addendum)
Make sure to let Dr Debara Pickett know about the pain you have from the generic crestor  Slows her down   Keep thinking about quitting smoking   It the pharmacy cannot get your magnesium, you may need to get it over the counter until the prescription comes back in stock    Take care of yourself  Keep working on a low glycemic diet  Try to get most of your carbohydrates from produce (with the exception of white potatoes)  Eat less bread/pasta/rice/snack foods/cereals/sweets and other items from the middle of the grocery store (processed carbs)  Labs today

## 2022-02-21 NOTE — Assessment & Plan Note (Signed)
Hypothyroidism  Pt has no clinical changes No change in energy level/ hair or skin/ edema and no tremor Lab Results  Component Value Date   TSH 0.56 10/07/2021    Better on current dose of 112 mcg levothy daily

## 2022-02-21 NOTE — Assessment & Plan Note (Signed)
a1c today  Diet is stable Nl foot exam  microalb utd  Discussed eye exam Continues metformin 500 mg bid

## 2022-02-21 NOTE — Assessment & Plan Note (Signed)
Ran out of px mag and pharmacy cannot get it  Level today May have to get otc temporarily

## 2022-02-21 NOTE — Assessment & Plan Note (Signed)
Now on crestor 5 mg every other day-not tolerating well at all Disc goals for lipids and reasons to control them Rev last labs with pt Rev low sat fat diet in detail Lab ordered  Also zetia 10 mg daily and Bempedoic Sees Dr Debara Pickett  Plans to call him about the statin  ? If candidate for pcyk9 Still smoking and high 10 y reisk 27.7% with fam history

## 2022-02-21 NOTE — Progress Notes (Signed)
Subjective:    Patient ID: Teresa Mcgee, female    DOB: 01-17-1966, 56 y.o.   MRN: [id number]  HPI Pt presents for fu of HTN and hyperlipidemia and chronic health problems  Wt Readings from Last 3 Encounters:  02/21/22 232 lb 9.6 oz (105.5 kg)  01/24/22 234 lb (106.1 kg)  12/01/21 233 lb 3.2 oz (105.8 kg)   41.20 kg/m  Hyperlipidemia Lab Results  Component Value Date   CHOL 167 08/19/2021   HDL 34.40 (L) 08/19/2021   LDLCALC 100 (H) 08/19/2021   TRIG 164.0 (H) 08/19/2021   CHOLHDL 5 08/19/2021   Statin is causing joint pain  Taking it at night -tried taking it every other day  Diet   Strong family h/o CAD Some aortic atherosclerosis on CT The 10-year ASCVD risk score (Arnett DK, et al., 2019) is: 27.7%   Values used to calculate the score:     Age: 28 years     Sex: Female     Is Non-Hispanic African American: Yes     Diabetic: Yes     Tobacco smoker: Yes     Systolic Blood Pressure: 694 mmHg     Is BP treated: Yes     HDL Cholesterol: 34.4 mg/dL     Total Cholesterol: 167 mg/dL   Crestor 5 mg  Zetia 10 mg  Bempedoic  (nexletol)  Sees cardiology  Dr Debara Pickett   Smoking status : still smokes 1/2 ppd  Has cut back from 2-3 packs per day  Eventually wants to quit  Has some patches and nicorette gum  Smoking since age 2  Per pt had lung cancer screening /CT   Ran out of magnesium- waiting for pharmacy to get / low stock Helps muscles  She may have to buy otc until the pharmacy gets it back    Takes vit D also    HTN bp is stable today  No cp or palpitations or headaches or edema  No side effects to medicines  BP Readings from Last 3 Encounters:  02/21/22 128/82  12/01/21 116/62  11/23/21 (!) 143/79    Amlodipine 10 mg daily  Hctz 25 mg daily   Lab Results  Component Value Date   CREATININE 1.00 08/19/2021   BUN 12 08/19/2021   NA 139 08/19/2021   K 3.7 08/19/2021   CL 99 08/19/2021   CO2 33 (H) 08/19/2021    DM2 Lab Results   Component Value Date   HGBA1C 6.2 08/19/2021   Metformin  Diet : does fairly well/ did cheat yesterday  Exercise is hard due to chronic pain / walks in her yard   Eats fruit instead of sweets   Lab Results  Component Value Date   MICROALBUR <0.7 08/19/2021   MICROALBUR <0.7 04/16/2020       Hypothyroidism  Pt has no clinical changes No change in energy level/ hair or skin/ edema and no tremor Lab Results  Component Value Date   TSH 0.56 10/07/2021    Levothyroxine 112 mcg daily    Patient Active Problem List   Diagnosis Date Noted   Hypomagnesemia 02/21/2022   Fatty liver 02/06/2022   Allergic conjunctivitis 12/01/2021   Elevated LFTs 11/22/2021   Medication monitoring encounter 11/22/2021   Fungal dermatitis 11/22/2021   Acute eruption of skin 11/22/2021   Routine general medical examination at a health care facility 08/19/2021   Encounter for screening mammogram for breast cancer 08/19/2021   Chronic use of opiate for  therapeutic purpose 82/95/6213   Uncomplicated opioid dependence ([place]) 11/04/2020   Ragged cuticle 09/23/2020   Type 2 diabetes mellitus without complication, without long-term current use of insulin ([place]) 03/30/2019   Pain due to onychomycosis of toenails of both feet 03/03/2019   Morbid obesity with BMI of 45.0-49.9, adult ([place]) 09/16/2018   Cervicalgia 04/22/2018   Chronic musculoskeletal pain 04/22/2018   DDD (degenerative disc disease), thoracic 04/22/2018   DDD (degenerative disc disease), cervical 04/22/2018   Lumbar facet arthropathy (Bilateral) 04/22/2018   Lumbar facet syndrome (Bilateral) 04/22/2018   Chronic low back pain (1ry area of Pain) (Bilateral) (L>R) w/o sciatica 04/22/2018   Spondylosis without myelopathy or radiculopathy, cervical region 04/22/2018   Spondylosis without myelopathy or radiculopathy, lumbosacral region 04/22/2018   DDD (degenerative disc disease), lumbosacral 04/22/2018   Strain of lumbar paraspinal muscle,  sequela 04/22/2018   Chronic hip pain (Bilateral) (R>L) 04/22/2018   Chronic sacroiliac joint pain (Left) 04/22/2018   Neurogenic pain 04/22/2018   Osteoarthritis involving multiple joints 04/22/2018   Chronic knee pain (Bilateral) (R>L) 04/22/2018   Chronic upper extremity pain (4th area of Pain) (Bilateral) (L>R) 02/27/2018   Vitamin D deficiency 02/27/2018   Chronic low back pain (Bilateral) (L>R) w/ sciatica (Bilateral) 01/24/2018   Chronic lower extremity pain (2ry area of Pain) (Bilateral) (L>R) 01/24/2018   Chronic neck pain (3ry area of Pain) (Bilateral) (L>R) 01/24/2018   Chronic pain syndrome 01/24/2018   Long term current use of opiate analgesic 01/24/2018   Pharmacologic therapy 01/24/2018   Disorder of skeletal system 01/24/2018   Problems influencing health status 01/24/2018   Osteopathic Exam Findings (Somatic Dysfunction) 04/25/2012   Constipation 01/11/2012   Chest pain 01/11/2012   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   Numbness and tingling in both hands 08/25/2011   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   Muscle spasms of neck 05/23/2011   Nasal mucositis (ulcerative) 05/23/2011   Fatigue 03/21/2011   Tobacco abuse 05/24/2010   Hypothyroidism 01/25/2010   HYPERTENSION, BENIGN ESSENTIAL 01/25/2010   Hyperlipidemia associated with type 2 diabetes mellitus ([place]) 12/31/2009   DEPRESSION 12/31/2009   GERD 12/31/2009   Sleep apnea 12/31/2009   Past Medical History:  Diagnosis Date   Arthritis    Bell's palsy may 2012   Chest pain    Diabetes mellitus without complication (HCC)    GERD (gastroesophageal reflux disease)    H/O hiatal hernia    Hyperlipidemia    Hypertension    Hypothyroidism    Shingles    Shortness of breath    Sleep apnea    uses cpap   Past Surgical History:  Procedure Laterality Date   ABDOMINAL HYSTERECTOMY     CESAREAN SECTION     cesarian     3x   COLONOSCOPY WITH PROPOFOL N/A 11/27/2019   Procedure: COLONOSCOPY WITH PROPOFOL;  Surgeon:  Jonathon Bellows, MD;  Location: [facility] ENDOSCOPY;  Service: Gastroenterology;  Laterality: N/A;   ESOPHAGOGASTRODUODENOSCOPY (EGD) WITH PROPOFOL N/A 07/20/2020   Procedure: ESOPHAGOGASTRODUODENOSCOPY (EGD) WITH PROPOFOL;  Surgeon: Jonathon Bellows, MD;  Location: [facility] ENDOSCOPY;  Service: Gastroenterology;  Laterality: N/A;   PARTIAL HYSTERECTOMY  2000   abnormal uterine bleeding   THYROIDECTOMY  1998   TOOTH EXTRACTION     TUBAL LIGATION     Social History   Tobacco Use   Smoking status: Every Day    Packs/day: 0.50    Years: 20.00    Total pack years: 10.00    Types: Cigarettes   Smokeless tobacco: Never  Tobacco comments:    2-3 cigs daily-09/22/2020  Vaping Use   Vaping Use: Never used  Substance Use Topics   Alcohol use: No   Drug use: No   Family History  Problem Relation Age of Onset   Heart attack Father 71       deceased   Diabetes Brother    Stroke Brother 71       twice   Stroke Sister 45   Diabetes Sister    Heart attack Sister 63   Heart attack Brother 39   Breast cancer Maternal Aunt    Allergies  Allergen Reactions   Statins     myopathy   Current Outpatient Medications on File Prior to Visit  Medication Sig Dispense Refill   Accu-Chek FastClix Lancets MISC USE UP TO 4 TIMES DAILY AS DIRECTED 102 each 1   ACCU-CHEK GUIDE test strip USE UP TO 4 TIMES DAILY AS DIRECTED 100 strip 5   albuterol (VENTOLIN HFA) 108 (90 Base) MCG/ACT inhaler INHALE 2 PUFFS INTO THE LUNGS EVERY 4 HOURS AS NEEDED FOR (COUGH, SHORTNESS OF BREATH OR WHEEZING.). 18 each 2   amLODipine (NORVASC) 10 MG tablet TAKE 1 TABLET BY MOUTH EVERY DAY 90 tablet 1   azelastine (OPTIVAR) 0.05 % ophthalmic solution Place 1 drop into both eyes 2 (two) times daily as needed. 6 mL 3   blood glucose meter kit and supplies Dispense based on patient and insurance preference. Use up to four times daily as directed. (FOR ICD-10 E10.9, E11.9). 1 each 0   [employer] D3 125 MCG (5000 UT) capsule TAKE 1 CAPSULE BY MOUTH  DAILY WITH BREAKFAST. TAKE ALONG WITH CALCIUM AND MAGNESIUM.  5   ezetimibe (ZETIA) 10 MG tablet TAKE 1 TABLET BY MOUTH EVERY DAY 90 tablet 1   fluticasone (FLONASE) 50 MCG/ACT nasal spray SPRAY 2 SPRAYS INTO EACH NOSTRIL EVERY DAY 48 mL 3   hydrochlorothiazide (HYDRODIURIL) 25 MG tablet TAKE 1 TABLET (25 MG TOTAL) BY MOUTH DAILY. AS DIRECTED 90 tablet 0   levocetirizine (XYZAL) 5 MG tablet TAKE 1 TABLET BY MOUTH EVERY DAY IN THE EVENING 90 tablet 1   levothyroxine (SYNTHROID) 112 MCG tablet TAKE 1 TABLET BY MOUTH EVERY DAY 90 tablet 1   Magnesium Oxide 500 MG CAPS TAKE 1 CAPSULE (500 MG TOTAL) BY MOUTH 2 (TWO) TIMES DAILY AT 8 AM AND 10 PM. 180 capsule 1   meloxicam (MOBIC) 15 MG tablet TAKE 1 TABLET BY MOUTH EVERY DAY WITH FOOD AS NEEDED FOR PAIN 90 tablet 1   Olopatadine HCl 0.2 % SOLN INSTILL 1 DROP INTO BOTH EYES EVERY DAY AS NEEDED     oxyCODONE (OXY IR/ROXICODONE) 5 MG immediate release tablet Take 1 tablet (5 mg total) by mouth every 8 (eight) hours as needed for severe pain. Must last 30 days 90 tablet 0   REFRESH CELLUVISC 1 % GEL APPLY 1 DROP TO EYE 3 (THREE) TIMES DAILY.  12   rosuvastatin (CRESTOR) 5 MG tablet Take 1 tablet (5 mg total) by mouth every other day. 45 tablet 3   Bempedoic Acid (NEXLETOL) 180 MG TABS TAKE 1 TABLET BY MOUTH EVERY DAY 90 tablet 3   gabapentin (NEURONTIN) 100 MG capsule TAKE 1 CAPSULE (100 MG TOTAL) BY MOUTH 2 (TWO) TIMES DAILY AND 3 CAPSULES (300 MG TOTAL) AT BEDTIME. 150 capsule 2   hydrOXYzine (ATARAX) 10 MG tablet Take 1 tablet (10 mg total) by mouth 3 (three) times daily as needed for itching. Caution of  sedation (Patient not taking: Reported on 02/21/2022) 30 tablet 1   nystatin (MYCOSTATIN) 100000 UNIT/ML suspension Take 5 mLs (500,000 Units total) by mouth in the morning, at noon, and at bedtime. (Patient not taking: Reported on 01/24/2022) 120 mL 0   tiZANidine (ZANAFLEX) 4 MG tablet Take 1 tablet (4 mg total) by mouth every 8 (eight) hours as needed for  muscle spasms. 90 tablet 2   No current facility-administered medications on file prior to visit.    Review of Systems  Constitutional:  Positive for fatigue. Negative for activity change, appetite change, fever and unexpected weight change.  HENT:  Negative for congestion, ear pain, rhinorrhea, sinus pressure and sore throat.   Eyes:  Negative for pain, redness and visual disturbance.  Respiratory:  Negative for cough, shortness of breath and wheezing.   Cardiovascular:  Negative for chest pain and palpitations.  Gastrointestinal:  Negative for abdominal pain, blood in stool, constipation and diarrhea.  Endocrine: Negative for polydipsia and polyuria.  Genitourinary:  Negative for dysuria, frequency and urgency.  Musculoskeletal:  Positive for arthralgias and myalgias. Negative for back pain.  Skin:  Negative for pallor and rash.  Allergic/Immunologic: Negative for environmental allergies.  Neurological:  Negative for dizziness, syncope and headaches.  Hematological:  Negative for adenopathy. Does not bruise/bleed easily.  Psychiatric/Behavioral:  Negative for decreased concentration and dysphoric mood. The patient is not nervous/anxious.        Objective:   Physical Exam Constitutional:      General: She is not in acute distress.    Appearance: Normal appearance. She is well-developed. She is obese. She is not ill-appearing or diaphoretic.  HENT:     Head: Normocephalic and atraumatic.  Eyes:     General: No scleral icterus.    Conjunctiva/sclera: Conjunctivae normal.     Pupils: Pupils are equal, round, and reactive to light.  Neck:     Thyroid: No thyromegaly.     Vascular: No carotid bruit or JVD.  Cardiovascular:     Rate and Rhythm: Normal rate and regular rhythm.     Heart sounds: Normal heart sounds.     No gallop.  Pulmonary:     Effort: Pulmonary effort is normal. No respiratory distress.     Breath sounds: Normal breath sounds. No wheezing or rales.  Abdominal:      General: There is no distension or abdominal bruit.     Palpations: Abdomen is soft.  Musculoskeletal:     Cervical back: Normal range of motion and neck supple.     Right lower leg: No edema.     Left lower leg: No edema.  Lymphadenopathy:     Cervical: No cervical adenopathy.  Skin:    General: Skin is warm and dry.     Coloration: Skin is not pale.     Findings: No rash.  Neurological:     Mental Status: She is alert.     Motor: No weakness.     Coordination: Coordination normal.     Deep Tendon Reflexes: Reflexes are normal and symmetric. Reflexes normal.  Psychiatric:        Mood and Affect: Mood normal.           Assessment & Plan:   Problem List Items Addressed This Visit       Cardiovascular and Mediastinum   HYPERTENSION, BENIGN ESSENTIAL    bp in fair control at this time  BP Readings from Last 1 Encounters:  02/21/22 128/82  No changes  needed Most recent labs reviewed  Disc lifstyle change with low sodium diet and exercise  Plan to continue  amlodiine 10 mg daily  hctz 25 mg daily  Lab ordered      Relevant Orders   Comprehensive metabolic panel     Endocrine   Hyperlipidemia associated with type 2 diabetes mellitus ([place])    Now on crestor 5 mg every other day-not tolerating well at all Disc goals for lipids and reasons to control them Rev last labs with pt Rev low sat fat diet in detail Lab ordered  Also zetia 10 mg daily and Bempedoic Sees Dr Debara Pickett  Plans to call him about the statin  ? If candidate for pcyk9 Still smoking and high 10 y reisk 27.7% with fam history       Relevant Medications   metFORMIN (GLUCOPHAGE) 500 MG tablet   Other Relevant Orders   Lipid panel   Hypothyroidism    Hypothyroidism  Pt has no clinical changes No change in energy level/ hair or skin/ edema and no tremor Lab Results  Component Value Date   TSH 0.56 10/07/2021    Better on current dose of 112 mcg levothy daily      Type 2 diabetes mellitus  without complication, without long-term current use of insulin (HCC) - Primary    a1c today  Diet is stable Nl foot exam  microalb utd  Discussed eye exam Continues metformin 500 mg bid       Relevant Medications   metFORMIN (GLUCOPHAGE) 500 MG tablet   Other Relevant Orders   Hemoglobin A1c     Other   Morbid obesity with BMI of 45.0-49.9, adult (HCC) (Chronic)    Discussed how this problem influences overall health and the risks it imposes  Reviewed plan for weight loss with lower calorie diet (via better food choices and also portion control or program like weight watchers) and exercise building up to or more than 30 minutes 5 days per week including some aerobic activity         Relevant Medications   metFORMIN (GLUCOPHAGE) 500 MG tablet   Hypomagnesemia    Ran out of px mag and pharmacy cannot get it  Level today May have to get otc temporarily      Relevant Orders   Magnesium   Other Visit Diagnoses     Type 2 diabetes mellitus with hyperglycemia, without long-term current use of insulin (HCC)       Relevant Medications   metFORMIN (GLUCOPHAGE) 500 MG tablet

## 2022-02-21 NOTE — Assessment & Plan Note (Signed)
Discussed how this problem influences overall health and the risks it imposes  Reviewed plan for weight loss with lower calorie diet (via better food choices and also portion control or program like weight watchers) and exercise building up to or more than 30 minutes 5 days per week including some aerobic activity    

## 2022-02-21 NOTE — Assessment & Plan Note (Signed)
bp in fair control at this time  BP Readings from Last 1 Encounters:  02/21/22 128/82   No changes needed Most recent labs reviewed  Disc lifstyle change with low sodium diet and exercise  Plan to continue  amlodiine 10 mg daily  hctz 25 mg daily  Lab ordered

## 2022-02-22 ENCOUNTER — Other Ambulatory Visit: Payer: Self-pay

## 2022-02-22 MED ORDER — POTASSIUM CHLORIDE ER 10 MEQ PO TBCR: 10.0000 meq | EXTENDED_RELEASE_TABLET | Freq: Every day | ORAL | 11 refills | Status: DC

## 2022-02-23 ENCOUNTER — Other Ambulatory Visit: Payer: Self-pay | Admitting: *Deleted

## 2022-02-23 DIAGNOSIS — E785 Hyperlipidemia, unspecified: Secondary | ICD-10-CM

## 2022-02-26 NOTE — Progress Notes (Unsigned)
PROVIDER NOTE: Information contained herein reflects review and annotations entered in association with encounter. Interpretation of such information and data should be left to medically-trained personnel. Information provided to patient can be located elsewhere in the medical record under "Patient Instructions". Document created using STT-dictation technology, any transcriptional errors that may result from process are unintentional.    Patient: Teresa Mcgee  Service Category: E/M  Provider: Gaspar Cola, MD  DOB: November 14, 1965  DOS: 02/27/2022  Specialty: Interventional Pain Management  MRN: [id number]  Setting: Ambulatory outpatient  PCP: Teresa Greenspan, MD  Type: Established Patient    Referring Provider: Abner Greenspan, MD  Location: Office  Delivery: Face-to-face     HPI  [name], a 56 y.o. year old female, is here today because of her No primary diagnosis found.. Teresa Mcgee's primary complain today is No chief complaint on file. Last encounter: My last encounter with her was on 11/23/2021. Pertinent problems: Teresa Mcgee has Muscle spasms of neck; Numbness and tingling in both hands; Chronic low back pain (Bilateral) (L>R) w/ sciatica (Bilateral); Chronic lower extremity pain (2ry area of Pain) (Bilateral) (L>R); Chronic neck pain (3ry area of Pain) (Bilateral) (L>R); Chronic pain syndrome; Chronic upper extremity pain (4th area of Pain) (Bilateral) (L>R); Cervicalgia; Chronic musculoskeletal pain; DDD (degenerative disc disease), thoracic; DDD (degenerative disc disease), cervical; Lumbar facet arthropathy (Bilateral); Lumbar facet syndrome (Bilateral); Chronic low back pain (1ry area of Pain) (Bilateral) (L>R) w/o sciatica; Spondylosis without myelopathy or radiculopathy, cervical region; Spondylosis without myelopathy or radiculopathy, lumbosacral region; DDD (degenerative disc disease), lumbosacral; Strain of lumbar paraspinal muscle, sequela; Chronic hip pain (Bilateral)  (R>L); Chronic sacroiliac joint pain (Left); Neurogenic pain; Osteoarthritis involving multiple joints; and Chronic knee pain (Bilateral) (R>L) on their pertinent problem list. Pain Assessment: Severity of   is reported as a  /10. Location:    / . Onset:  . Quality:  . Timing:  . Modifying factor(s):  Marland Kitchen Vitals:  vitals were not taken for this visit.   Reason for encounter: medication management. ***  Pharmacotherapy Assessment  Analgesic: Oxycodone IR 5 mg, 1 tab PO q 8 hrs (PRN) (15 mg/day of oxycodone) MME/day: 22.5 mg/day.   Monitoring: [place] PMP: PDMP reviewed during this encounter.       Pharmacotherapy: No side-effects or adverse reactions reported. Compliance: No problems identified. Effectiveness: Clinically acceptable.  No notes on file  UDS:  Summary  Date Value Ref Range Status  03/09/2021 Note  Final    Comment:    ==================================================================== [employer] Select 13 (MW) ==================================================================== Test                             Result       Flag       Units  Drug Present and Declared for Prescription Verification   Oxymorphone                    489          EXPECTED   ng/mg creat   Noroxycodone                   139          EXPECTED   ng/mg creat    Oxymorphone and noroxycodone are expected metabolites of oxycodone.    Sources of oxycodone are scheduled prescription medications.    Oxymorphone is also available as a scheduled prescription medication.  Drug  Absent but Declared for Prescription Verification   Oxycodone                      Not Detected UNEXPECTED ng/mg creat    Oxycodone is almost always present in patients taking this drug    consistently.  Absence of oxycodone could be due to lapse of time    since the last dose or unusual pharmacokinetics (rapid metabolism).  ==================================================================== Test                      Result    Flag    Units      Ref Range   Creatinine              46               mg/dL      >=20 ==================================================================== Declared Medications:  The flagging and interpretation on this report are based on the  following declared medications.  Unexpected results may arise from  inaccuracies in the declared medications.   **Note: The testing scope of this panel includes these medications:   Oxycodone (Roxicodone)   **Note: The testing scope of this panel does not include the  following reported medications:   Albuterol (Ventolin HFA)  Amlodipine (Norvasc)  Azelastine (Optivar)  Bempedoic Acid (Nexletol)  Eye Drop  Ezetimibe (Zetia)  Fluticasone (Flonase)  Gabapentin (Neurontin)  Hydrochlorothiazide (Hydrodiuril)  Levocetirizine (Xyzal)  Levothyroxine (Synthroid)  Magnesium  Meloxicam (Mobic)  Metformin (Glucophage)  Olopatadine  Omeprazole (Prilosec)  Tizanidine (Zanaflex)  Vitamin D3 ==================================================================== For clinical consultation, please call [phone]. ====================================================================      ROS  Constitutional: Denies any fever or chills Gastrointestinal: No reported hemesis, hematochezia, vomiting, or acute GI distress Musculoskeletal: Denies any acute onset joint swelling, redness, loss of ROM, or weakness Neurological: No reported episodes of acute onset apraxia, aphasia, dysarthria, agnosia, amnesia, paralysis, loss of coordination, or loss of consciousness  Medication Review  Accu-Chek FastClix Lancets, Bempedoic Acid, Carboxymethylcellulose Sod PF, Cholecalciferol, Magnesium Oxide -Mg Supplement, Olopatadine HCl, albuterol, amLODipine, azelastine, blood glucose meter kit and supplies, ezetimibe, fluticasone, gabapentin, glucose blood, hydrOXYzine, hydrochlorothiazide, levocetirizine, levothyroxine, meloxicam, metFORMIN, nystatin, oxyCODONE, potassium  chloride, rosuvastatin, and tiZANidine  History Review  Allergy: Teresa Mcgee is allergic to statins. Drug: Teresa Mcgee  reports no history of drug use. Alcohol:  reports no history of alcohol use. Tobacco:  reports that she has been smoking cigarettes. She has a 10.00 pack-year smoking history. She has never used smokeless tobacco. Social: Ms. Payson  reports that she has been smoking cigarettes. She has a 10.00 pack-year smoking history. She has never used smokeless tobacco. She reports that she does not drink alcohol and does not use drugs. Medical:  has a past medical history of Arthritis, Bell's palsy (may 2012), Chest pain, Diabetes mellitus without complication ([place]), GERD (gastroesophageal reflux disease), H/O hiatal hernia, Hyperlipidemia, Hypertension, Hypothyroidism, Shingles, Shortness of breath, and Sleep apnea. Surgical: Ms. Ratliff  has a past surgical history that includes Thyroidectomy (1998); cesarian; Partial hysterectomy (2000); Cesarean section; Tubal ligation; Tooth extraction; Abdominal hysterectomy; Colonoscopy with propofol (N/A, 11/27/2019); and Esophagogastroduodenoscopy (egd) with propofol (N/A, 07/20/2020). Family: family history includes Breast cancer in her maternal aunt; Diabetes in her brother and sister; Heart attack (age of onset: 25) in her brother; Heart attack (age of onset: 40) in her sister; Heart attack (age of onset: 72) in her father; Stroke (age of onset: 32) in her brother; Stroke (age of  onset: 69) in her sister.  Laboratory Chemistry Profile   Renal Lab Results  Component Value Date   BUN 11 02/21/2022   CREATININE 0.93 02/21/2022   BCR 8 (L) 01/24/2018   GFR 69.16 02/21/2022   GFRAA >60 05/25/2018   GFRNONAA >60 05/25/2018    Hepatic Lab Results  Component Value Date   AST 112 (H) 02/21/2022   ALT 73 (H) 02/21/2022   ALBUMIN 4.7 02/21/2022   ALKPHOS 76 02/21/2022   LIPASE 25 05/25/2018    Electrolytes Lab Results  Component Value Date   NA  141 02/21/2022   K 3.4 (L) 02/21/2022   CL 99 02/21/2022   CALCIUM 10.5 02/21/2022   MG 1.7 02/21/2022    Bone Lab Results  Component Value Date   VD25OH 68.72 08/19/2021   25OHVITD1 9.3 (L) 01/24/2018   25OHVITD2 <1.0 01/24/2018   25OHVITD3 9.3 01/24/2018    Inflammation (CRP: Acute Phase) (ESR: Chronic Phase) Lab Results  Component Value Date   CRP 5.5 (H) 01/24/2018   ESRSEDRATE 40 01/24/2018         Note: Above Lab results reviewed.  Recent Imaging Review  US Abdomen Complete CLINICAL DATA:  Elevated LFTs  EXAM: ABDOMEN ULTRASOUND COMPLETE  COMPARISON:  Abdominal ultrasound 06/12/2007  FINDINGS: Gallbladder: No gallstones or wall thickening visualized. No sonographic Murphy sign noted by sonographer.  Common bile duct: Diameter: 3 mm  Liver: Coarse, increased echogenicity of the parenchyma with no focal mass identified. Portal vein is patent on color Doppler imaging with normal direction of blood flow towards the liver.  IVC: No abnormality visualized.  Pancreas: Not well visualized.  Spleen: Size and appearance within normal limits.  Right Kidney: Length: 10.9 cm. Echogenicity within normal limits. No mass or hydronephrosis visualized.  Left Kidney: Length: 11.2 cm. Echogenicity within normal limits. No mass or hydronephrosis visualized.  Abdominal aorta: No aneurysm visualized.  Other findings: None.  IMPRESSION: Abnormal appearance of the liver parenchyma suggesting hepatic steatosis and/or other hepatocellular disease.  Electronically Signed   By: Ofilia Neas M.D.   On: 02/02/2022 11:13 Note: Reviewed        Physical Exam  General appearance: Well nourished, well developed, and well hydrated. In no apparent acute distress Mental status: Alert, oriented x 3 (person, place, & time)       Respiratory: No evidence of acute respiratory distress Eyes: PERLA Vitals: LMP  (LMP Unknown)  BMI: Estimated body mass index is 41.2 kg/m as  calculated from the following:   Height as of 02/21/22: '5\' 3"'  (1.6 m).   Weight as of 02/21/22: 232 lb 9.6 oz (105.5 kg). Ideal: Ideal body weight: 52.4 kg (115 lb 8.3 oz) Adjusted ideal body weight: 73.6 kg (162 lb 5.6 oz)  Assessment   Diagnosis Status  1. Chronic pain syndrome   2. Pharmacologic therapy   3. Chronic use of opiate for therapeutic purpose   4. Uncomplicated opioid dependence (Ramirez-Perez)   5. Chronic low back pain (1ry area of Pain) (Bilateral) (L>R) w/o sciatica   6. Chronic lower extremity pain (2ry area of Pain) (Bilateral) (L>R)   7. Chronic neck pain (3ry area of Pain) (Bilateral) (L>R)   8. Chronic upper extremity pain (4th area of Pain) (Bilateral) (L>R)   9. Chronic hip pain (Bilateral) (R>L)   10. Chronic knee pain (Bilateral) (R>L)   11. Chronic sacroiliac joint pain (Left)   12. DDD (degenerative disc disease), lumbosacral   13. Lumbar facet syndrome (Bilateral)   14.  Encounter for medication management   15. Encounter for chronic pain management    Controlled Controlled Controlled   Updated Problems: No problems updated.  Plan of Care  Problem-specific:  No problem-specific Assessment & Plan notes found for this encounter.  Ms. BLEU MOISAN has a current medication list which includes the following long-term medication(s): albuterol, amlodipine, ezetimibe, fluticasone, gabapentin, hydrochlorothiazide, levocetirizine, levothyroxine, magnesium oxide -mg supplement, meloxicam, metformin, oxycodone, potassium chloride, rosuvastatin, and tizanidine.  Pharmacotherapy (Medications Ordered): No orders of the defined types were placed in this encounter.  Orders:  No orders of the defined types were placed in this encounter.  Follow-up plan:   No follow-ups on file.     Interventional management options:  Considering:   NOTE: NO RFA until BMI <35  Possible bilateral lumbar facet RFA  Diagnostic left-sided sacroiliac joint block  Possible left-sided  sacroiliac joint RFA  Diagnostic bilateral IA hip joint injection  Diagnostic bilateral femoral nerve + obturator NB  Possible bilateral femoral nerve + obturator nerve RFA  Diagnostic left-sided LESI  Diagnostic bilateral transforaminal ESI  Diagnostic left-sided CESI  Diagnostic bilateral cervical facet block  Possible bilateral cervical facet RFA  Diagnostic bilateral IA knee joint injections with local anesthetic and steroid  Possible series of 5 bilateral intra-articular Hyalgan knee injections  Diagnostic bilateral genicular nerve blocks  Possible bilateral genicular nerve RFA  Diagnostic trigger point injections    Palliative PRN treatment(s):   Palliative bilateral lumbar facet block      Recent Visits No visits were found meeting these conditions. Showing recent visits within past 90 days and meeting all other requirements Future Appointments Date Type Provider Dept  02/27/22 Appointment Milinda Pointer, MD [facility]  Showing future appointments within next 90 days and meeting all other requirements  I discussed the assessment and treatment plan with the patient. The patient was provided an opportunity to ask questions and all were answered. The patient agreed with the plan and demonstrated an understanding of the instructions.  Patient advised to call back or seek an in-person evaluation if the symptoms or condition worsens.  Duration of encounter: *** minutes.  Total time on encounter, as per AMA guidelines included both the face-to-face and non-face-to-face time personally spent by the physician and/or other qualified health care professional(s) on the day of the encounter (includes time in activities that require the physician or other qualified health care professional and does not include time in activities normally performed by clinical staff). Physician's time may include the following activities when performed: preparing to see the patient (eg, review  of tests, pre-charting review of records) obtaining and/or reviewing separately obtained history performing a medically appropriate examination and/or evaluation counseling and educating the patient/family/caregiver ordering medications, tests, or procedures referring and communicating with other health care professionals (when not separately reported) documenting clinical information in the electronic or other health record independently interpreting results (not separately reported) and communicating results to the patient/ family/caregiver care coordination (not separately reported)  Note by: Teresa Cola, MD Date: 02/27/2022; Time: 3:30 PM

## 2022-02-27 ENCOUNTER — Ambulatory Visit: Payer: Medicare HMO | Attending: Pain Medicine | Admitting: Pain Medicine

## 2022-02-27 ENCOUNTER — Encounter: Payer: Self-pay | Admitting: Pain Medicine

## 2022-02-27 VITALS — BP 143/80 | HR 75 | Temp 97.1°F | Resp 16 | Ht 63.0 in | Wt 232.0 lb

## 2022-02-27 DIAGNOSIS — Z79891 Long term (current) use of opiate analgesic: Secondary | ICD-10-CM | POA: Insufficient documentation

## 2022-02-27 DIAGNOSIS — M25551 Pain in right hip: Secondary | ICD-10-CM | POA: Diagnosis not present

## 2022-02-27 DIAGNOSIS — G8929 Other chronic pain: Secondary | ICD-10-CM | POA: Insufficient documentation

## 2022-02-27 DIAGNOSIS — G894 Chronic pain syndrome: Secondary | ICD-10-CM | POA: Insufficient documentation

## 2022-02-27 DIAGNOSIS — M545 Low back pain, unspecified: Secondary | ICD-10-CM | POA: Insufficient documentation

## 2022-02-27 DIAGNOSIS — M5137 Other intervertebral disc degeneration, lumbosacral region: Secondary | ICD-10-CM | POA: Diagnosis not present

## 2022-02-27 DIAGNOSIS — M542 Cervicalgia: Secondary | ICD-10-CM | POA: Insufficient documentation

## 2022-02-27 DIAGNOSIS — M79602 Pain in left arm: Secondary | ICD-10-CM | POA: Diagnosis present

## 2022-02-27 DIAGNOSIS — F1120 Opioid dependence, uncomplicated: Secondary | ICD-10-CM | POA: Insufficient documentation

## 2022-02-27 DIAGNOSIS — M79604 Pain in right leg: Secondary | ICD-10-CM | POA: Diagnosis not present

## 2022-02-27 DIAGNOSIS — M25562 Pain in left knee: Secondary | ICD-10-CM | POA: Diagnosis present

## 2022-02-27 DIAGNOSIS — M533 Sacrococcygeal disorders, not elsewhere classified: Secondary | ICD-10-CM | POA: Insufficient documentation

## 2022-02-27 DIAGNOSIS — Z79899 Other long term (current) drug therapy: Secondary | ICD-10-CM | POA: Insufficient documentation

## 2022-02-27 DIAGNOSIS — M79601 Pain in right arm: Secondary | ICD-10-CM | POA: Insufficient documentation

## 2022-02-27 DIAGNOSIS — M25552 Pain in left hip: Secondary | ICD-10-CM | POA: Insufficient documentation

## 2022-02-27 DIAGNOSIS — M47816 Spondylosis without myelopathy or radiculopathy, lumbar region: Secondary | ICD-10-CM | POA: Diagnosis present

## 2022-02-27 DIAGNOSIS — M79605 Pain in left leg: Secondary | ICD-10-CM | POA: Insufficient documentation

## 2022-02-27 DIAGNOSIS — M25561 Pain in right knee: Secondary | ICD-10-CM | POA: Insufficient documentation

## 2022-02-27 MED ORDER — OXYCODONE HCL 5 MG PO TABS: 5.0000 mg | ORAL_TABLET | Freq: Three times a day (TID) | ORAL | 0 refills | Status: DC | PRN

## 2022-02-27 NOTE — Progress Notes (Signed)
Nursing Pain Medication Assessment:  Safety precautions to be maintained throughout the outpatient stay will include: orient to surroundings, keep bed in low position, maintain call bell within reach at all times, provide assistance with transfer out of bed and ambulation.  Medication Inspection Compliance: Pill count conducted under aseptic conditions, in front of the patient. Neither the pills nor the bottle was removed from the patient's sight at any time. Once count was completed pills were immediately returned to the patient in their original bottle.  Medication: Oxycodone IR Pill/Patch Count:  17 of 90 pills remain Pill/Patch Appearance: Markings consistent with prescribed medication Bottle Appearance: Standard pharmacy container. Clearly labeled. Filled Date: 06 / 24 / 2023 Last Medication intake:  Today

## 2022-02-27 NOTE — Patient Instructions (Signed)

## 2022-03-02 LAB — TOXASSURE SELECT 13 (MW), URINE

## 2022-03-08 ENCOUNTER — Telehealth: Payer: Self-pay | Admitting: Family Medicine

## 2022-03-08 DIAGNOSIS — E876 Hypokalemia: Secondary | ICD-10-CM

## 2022-03-08 NOTE — Telephone Encounter (Signed)
-----   Message from Ellamae Sia sent at 02/27/2022 11:22 AM EDT ----- Regarding: Lab orders for Thursday, 7.27.23 Lab orders, no f/u appt

## 2022-03-09 ENCOUNTER — Other Ambulatory Visit (INDEPENDENT_AMBULATORY_CARE_PROVIDER_SITE_OTHER): Payer: Medicare HMO

## 2022-03-09 DIAGNOSIS — E876 Hypokalemia: Secondary | ICD-10-CM

## 2022-03-09 LAB — BASIC METABOLIC PANEL
BUN: 9 mg/dL (ref 6–23)
CO2: 31 mEq/L (ref 19–32)
Calcium: 9.9 mg/dL (ref 8.4–10.5)
Chloride: 100 mEq/L (ref 96–112)
Creatinine, Ser: 0.94 mg/dL (ref 0.40–1.20)
GFR: 68.26 mL/min (ref >60.00)
Glucose, Bld: 105 mg/dL — ABNORMAL HIGH (ref 70–99)
Potassium: 3.7 mEq/L (ref 3.5–5.1)
Sodium: 140 mEq/L (ref 135–145)

## 2022-03-10 ENCOUNTER — Other Ambulatory Visit: Payer: Self-pay

## 2022-03-13 ENCOUNTER — Ambulatory Visit (INDEPENDENT_AMBULATORY_CARE_PROVIDER_SITE_OTHER): Payer: Medicare HMO | Admitting: Gastroenterology

## 2022-03-13 ENCOUNTER — Encounter: Payer: Self-pay | Admitting: Gastroenterology

## 2022-03-13 VITALS — BP 128/84 | HR 72 | Temp 98.4°F | Ht 63.0 in | Wt 234.0 lb

## 2022-03-13 DIAGNOSIS — R7989 Other specified abnormal findings of blood chemistry: Secondary | ICD-10-CM

## 2022-03-13 DIAGNOSIS — R1310 Dysphagia, unspecified: Secondary | ICD-10-CM

## 2022-03-13 NOTE — Progress Notes (Signed)
Jonathon Bellows MD, MRCP(U.K) [address]  [place]  [place], [place] [address]  Main: [phone]  Fax: [phone]   Primary Care Physician: Tower, Wynelle Fanny, MD  Primary Gastroenterologist:  Dr. Jonathon Bellows   Chief Complaint  Patient presents with   New Patient (Initial Visit)    HPI: Teresa Mcgee is a 56 y.o. female   Summary of history : Initially referred and seen back in November 2021 for dysphagia.  History of hiatal hernia, acid reflux for many years at that time she felt like the food got stuck in the center of her chest meats were harder to swallow was on Prilosec 20 mg after her dinner.  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  Biopsies of the gastric antrum showed reactive gastritis.  No follow-up subsequently.  Presently referred for elevated LFTs 02/02/2022 right upper quadrant ultrasound showed hepatic steatosis 02/21/2022: AST 112, ALT 73, HbA1c 6.3, LFTs have been elevated for over 3 years but gradually increasing.   She states that since her dilation in 2021 still has difficulty swallowing on the pills not really food.  She knows she does not have any teeth and needs to get it fixed.  Denies any new medications.  She was on Crestor but had stopped it due to muscle pains.  No over-the-counter medications.  No recent blood transfusions or in the past.  No illegal drug use.  No alcohol consumption.     Current Outpatient Medications  Medication Sig Dispense Refill   Accu-Chek FastClix Lancets MISC USE UP TO 4 TIMES DAILY AS DIRECTED 102 each 1   ACCU-CHEK GUIDE test strip USE UP TO 4 TIMES DAILY AS DIRECTED 100 strip 5   albuterol (VENTOLIN HFA) 108 (90 Base) MCG/ACT inhaler INHALE 2 PUFFS INTO THE LUNGS EVERY 4 HOURS AS NEEDED FOR (COUGH, SHORTNESS OF BREATH OR WHEEZING.). 18 each  2   amLODipine (NORVASC) 10 MG tablet TAKE 1 TABLET BY MOUTH EVERY DAY 90 tablet 1   azelastine (OPTIVAR) 0.05 % ophthalmic solution Place 1 drop into both eyes 2 (two) times daily as needed. 6 mL 3   Bempedoic Acid (NEXLETOL) 180 MG TABS TAKE 1 TABLET BY MOUTH EVERY DAY 90 tablet 3   blood glucose meter kit and supplies Dispense based on patient and insurance preference. Use up to four times daily as directed. (FOR ICD-10 E10.9, E11.9). 1 each 0   [employer] D3 125 MCG (5000 UT) capsule TAKE 1 CAPSULE BY MOUTH DAILY WITH BREAKFAST. TAKE ALONG WITH CALCIUM AND MAGNESIUM.  5   ezetimibe (ZETIA) 10 MG tablet TAKE 1 TABLET BY MOUTH EVERY DAY 90 tablet 1   fluticasone (FLONASE) 50 MCG/ACT nasal spray SPRAY 2 SPRAYS INTO EACH NOSTRIL EVERY DAY 48 mL 3   hydrochlorothiazide (HYDRODIURIL) 25 MG tablet TAKE 1 TABLET (25 MG TOTAL) BY MOUTH DAILY. AS DIRECTED 90 tablet 0   levocetirizine (XYZAL) 5 MG tablet TAKE 1 TABLET BY MOUTH EVERY DAY IN THE EVENING 90 tablet 1   levothyroxine (SYNTHROID) 112 MCG tablet TAKE 1 TABLET BY MOUTH EVERY DAY 90 tablet 1   Magnesium Oxide 500 MG CAPS TAKE 1 CAPSULE (500 MG TOTAL) BY MOUTH 2 (TWO) TIMES DAILY AT 8 AM AND 10 PM. 180 capsule 1   meloxicam (MOBIC) 15 MG tablet TAKE 1 TABLET BY MOUTH EVERY DAY WITH FOOD AS NEEDED  FOR PAIN 90 tablet 1   metFORMIN (GLUCOPHAGE) 500 MG tablet Take 1 tablet (500 mg total) by mouth 2 (two) times daily with a meal. 180 tablet 3   Olopatadine HCl 0.2 % SOLN INSTILL 1 DROP INTO BOTH EYES EVERY DAY AS NEEDED     oxyCODONE (OXY IR/ROXICODONE) 5 MG immediate release tablet Take 1 tablet (5 mg total) by mouth every 8 (eight) hours as needed for severe pain. Must last 30 days 90 tablet 0   [START ON 04/05/2022] oxyCODONE (OXY IR/ROXICODONE) 5 MG immediate release tablet Take 1 tablet (5 mg total) by mouth every 8 (eight) hours as needed for severe pain. Must last 30 days 90 tablet 0   [START ON 05/05/2022] oxyCODONE (OXY IR/ROXICODONE) 5 MG immediate  release tablet Take 1 tablet (5 mg total) by mouth every 8 (eight) hours as needed for severe pain. Must last 30 days 90 tablet 0   potassium chloride (KLOR-CON 10) 10 MEQ tablet Take 1 tablet (10 mEq total) by mouth daily. 30 tablet 11   REFRESH CELLUVISC 1 % GEL APPLY 1 DROP TO EYE 3 (THREE) TIMES DAILY.  12   gabapentin (NEURONTIN) 100 MG capsule TAKE 1 CAPSULE (100 MG TOTAL) BY MOUTH 2 (TWO) TIMES DAILY AND 3 CAPSULES (300 MG TOTAL) AT BEDTIME. 150 capsule 2   No current facility-administered medications for this visit.    Allergies as of 03/13/2022 - Review Complete 03/13/2022  Allergen Reaction Noted   Statins  08/19/2021    ROS:  General: Negative for anorexia, weight loss, fever, chills, fatigue, weakness. ENT: Negative for hoarseness, difficulty swallowing , nasal congestion. CV: Negative for chest pain, angina, palpitations, dyspnea on exertion, peripheral edema.  Respiratory: Negative for dyspnea at rest, dyspnea on exertion, cough, sputum, wheezing.  GI: See history of present illness. GU:  Negative for dysuria, hematuria, urinary incontinence, urinary frequency, nocturnal urination.  Endo: Negative for unusual weight change.    Physical Examination:   BP 128/84   Pulse 72   Temp 98.4 F (36.9 C) (Oral)   Ht _0  (1.6 m)   Wt 234 lb (106.1 kg)   LMP  (LMP Unknown)   BMI 41.45 kg/m   General: Well-nourished, well-developed in no acute distress.  Eyes: No icterus. Conjunctivae pink. Neuro: Alert and oriented x 3.  Grossly intact. Skin: Warm and dry, no jaundice.   Psych: Alert and cooperative, normal mood and affect.  Body mass index is 41.45 kg/m.  Imaging Studies: No results found.  Assessment and Plan:   Teresa Mcgee is a 56 y.o. y/o female Previously seen for dysphagia Schatzki's ring dilated in 2021 here today referred for abnormal LFTs ultrasound showed hepatic steatosis.  Gradual rise in transaminases.  HbA1c 6.3.  She weighs 232 pounds.  Likely  has metabolic syndrome and nonalcoholic fatty liver disease.   Plan 1.  Full autoimmune and viral hepatitis work-up 2.  Counseled about weight loss, healthy eating such as a Mediterranean diet calorie restriction would recommend evaluation by nutrition.  There are some new drugs in phase 2 phase 3 trials for NASH specifically which would probably be available in the next 6 months to 1 years time.  We will reassess her at that point of time to determine if she would be a candidate for these  3.  She has no teeth hence probably has a degree of difficulty swallowing food suggest her to get a set of false teeth.  If the issue persists we could  repeat endoscopy and stretcher yet again 4.  Obesity discussed about weight loss suggested a Mediterranean diet.  She has got anew air Rolly Salter will cut down use of saturated fat.  She has also seen a dietitian to lose weight.    Dr Jonathon Bellows  MD,MRCP [facility]) Follow up in 4 to 5 months

## 2022-03-16 LAB — CK: Total CK: 202 U/L — ABNORMAL HIGH (ref 32–182)

## 2022-03-16 LAB — IMMUNOGLOBULINS A/E/G/M, SERUM
IgA/Immunoglobulin A, Serum: 200 mg/dL (ref 87–352)
IgE (Immunoglobulin E), Serum: 960 IU/mL — ABNORMAL HIGH (ref 6–495)
IgG (Immunoglobin G), Serum: 1742 mg/dL — ABNORMAL HIGH (ref 586–1602)
IgM (Immunoglobulin M), Srm: 105 mg/dL (ref 26–217)

## 2022-03-16 LAB — HEPATITIS C ANTIBODY: Hep C Virus Ab: NONREACTIVE

## 2022-03-16 LAB — HEPATITIS A ANTIBODY, TOTAL: hep A Total Ab: POSITIVE — AB

## 2022-03-16 LAB — HEPATITIS B SURFACE ANTIBODY,QUALITATIVE: Hep B Surface Ab, Qual: NONREACTIVE

## 2022-03-16 LAB — ANA: Anti Nuclear Antibody (ANA): NEGATIVE

## 2022-03-16 LAB — IRON,TIBC AND FERRITIN PANEL
Ferritin: 287 ng/mL — ABNORMAL HIGH (ref 15–150)
Iron Saturation: 28 % (ref 15–55)
Iron: 108 ug/dL (ref 27–159)
Total Iron Binding Capacity: 392 ug/dL (ref 250–450)
UIBC: 284 ug/dL (ref 131–425)

## 2022-03-16 LAB — MITOCHONDRIAL/SMOOTH MUSCLE AB PNL
Mitochondrial Ab: <20 Units (ref 0.0–20.0)
Smooth Muscle Ab: 34 Units — ABNORMAL HIGH (ref 0–19)

## 2022-03-16 LAB — HEPATITIS B E ANTIGEN: Hep B E Ag: NEGATIVE

## 2022-03-16 LAB — ANTI-MICROSOMAL ANTIBODY LIVER / KIDNEY: LKM1 Ab: 2.3 Units (ref 0.0–20.0)

## 2022-03-16 LAB — CELIAC DISEASE AB SCREEN W/RFX
Antigliadin Abs, IgA: 6 units (ref 0–19)
Transglutaminase IgA: <2 U/mL (ref 0–3)

## 2022-03-16 LAB — ALPHA-1-ANTITRYPSIN: A-1 Antitrypsin: 154 mg/dL (ref 101–187)

## 2022-03-16 LAB — HEPATITIS B SURFACE ANTIGEN: Hepatitis B Surface Ag: NEGATIVE

## 2022-03-16 LAB — CERULOPLASMIN: Ceruloplasmin: 20.9 mg/dL (ref 19.0–39.0)

## 2022-03-16 LAB — HEPATITIS B E ANTIBODY: Hep B E Ab: NEGATIVE

## 2022-03-16 LAB — HEPATITIS B CORE ANTIBODY, TOTAL: Hep B Core Total Ab: NEGATIVE

## 2022-03-16 LAB — HIV ANTIBODY (ROUTINE TESTING W REFLEX): HIV Screen 4th Generation wRfx: NONREACTIVE

## 2022-03-20 ENCOUNTER — Other Ambulatory Visit: Payer: Self-pay | Admitting: Family Medicine

## 2022-04-01 ENCOUNTER — Other Ambulatory Visit: Payer: Self-pay | Admitting: Family Medicine

## 2022-04-01 DIAGNOSIS — M159 Polyosteoarthritis, unspecified: Secondary | ICD-10-CM

## 2022-04-03 NOTE — Telephone Encounter (Signed)
Last filled 01/02/22 Last ov 02/21/22

## 2022-04-19 ENCOUNTER — Other Ambulatory Visit: Payer: Self-pay | Admitting: *Deleted

## 2022-04-19 DIAGNOSIS — E785 Hyperlipidemia, unspecified: Secondary | ICD-10-CM

## 2022-04-24 ENCOUNTER — Other Ambulatory Visit
Admission: RE | Admit: 2022-04-24 | Discharge: 2022-04-24 | Disposition: A | Discharge status: Home / Self Care | Payer: Medicare HMO | Attending: Internal Medicine | Admitting: Internal Medicine

## 2022-04-24 DIAGNOSIS — E785 Hyperlipidemia, unspecified: Secondary | ICD-10-CM | POA: Insufficient documentation

## 2022-04-24 LAB — HEPATIC FUNCTION PANEL
ALT: 86 U/L — ABNORMAL HIGH (ref 0–44)
AST: 133 U/L — ABNORMAL HIGH (ref 15–41)
Albumin: 4.4 g/dL (ref 3.5–5.0)
Alkaline Phosphatase: 72 U/L (ref 38–126)
Bilirubin, Direct: 0.1 mg/dL (ref 0.0–0.2)
Indirect Bilirubin: 0.5 mg/dL (ref 0.3–0.9)
Total Bilirubin: 0.6 mg/dL (ref 0.3–1.2)
Total Protein: 8.6 g/dL — ABNORMAL HIGH (ref 6.5–8.1)

## 2022-05-01 NOTE — Progress Notes (Signed)
Thank you sounds like a plan.  Regards  Hollie Wojahn

## 2022-05-04 ENCOUNTER — Ambulatory Visit (INDEPENDENT_AMBULATORY_CARE_PROVIDER_SITE_OTHER): Payer: Medicare HMO | Admitting: Podiatry

## 2022-05-04 ENCOUNTER — Encounter: Payer: Self-pay | Admitting: Podiatry

## 2022-05-04 DIAGNOSIS — M79675 Pain in left toe(s): Secondary | ICD-10-CM

## 2022-05-04 DIAGNOSIS — M79674 Pain in right toe(s): Secondary | ICD-10-CM

## 2022-05-04 DIAGNOSIS — E119 Type 2 diabetes mellitus without complications: Secondary | ICD-10-CM

## 2022-05-04 DIAGNOSIS — M792 Neuralgia and neuritis, unspecified: Secondary | ICD-10-CM | POA: Diagnosis not present

## 2022-05-04 DIAGNOSIS — B351 Tinea unguium: Secondary | ICD-10-CM

## 2022-05-04 NOTE — Progress Notes (Signed)
This patient returns to my office for at risk foot care.  This patient requires this care by a professional since this patient will be at risk due to having diabetes.  This patient is unable to cut nails herself since the patient cannot reach her nails.These nails are painful walking and wearing shoes.  This patient presents for at risk foot care today.  General Appearance  Alert, conversant and in no acute stress.  Vascular  Dorsalis pedis   pulses are palpable  bilaterally.   Posterior tibial pulses are absent  B/L. Capillary return is within normal limits  bilaterally. Cold feet   Bilaterally.  Absent hair  B/L.  Neurologic  Senn-Weinstein monofilament wire test within normal limits  bilaterally. Muscle power within normal limits bilaterally.  Nails Thick disfigured discolored nails with subungual debris  from hallux to fifth toes bilaterally. No evidence of bacterial infection or drainage bilaterally.  Orthopedic  No limitations of motion  feet .  No crepitus or effusions noted.  No bony pathology or digital deformities noted.  Skin  normotropic skin with no porokeratosis noted bilaterally.  No signs of infections or ulcers noted.     Onychomycosis  Pain in right toes  Pain in left toes  Consent was obtained for treatment procedures.   Mechanical debridement of nails 1-5  bilaterally performed with a nail nipper.  Filed with dremel without incident.    Return office visit   3 months                   Told patient to return for periodic foot care and evaluation due to potential at risk complications.   Adelaine Roppolo DPM  

## 2022-05-05 ENCOUNTER — Other Ambulatory Visit: Payer: Self-pay | Admitting: Family Medicine

## 2022-05-05 DIAGNOSIS — M62838 Other muscle spasm: Secondary | ICD-10-CM

## 2022-05-05 DIAGNOSIS — M7918 Myalgia, other site: Secondary | ICD-10-CM

## 2022-05-05 DIAGNOSIS — I10 Essential (primary) hypertension: Secondary | ICD-10-CM

## 2022-05-27 NOTE — Progress Notes (Unsigned)
PROVIDER NOTE: Information contained herein reflects review and annotations entered in association with encounter. Interpretation of such information and data should be left to medically-trained personnel. Information provided to patient can be located elsewhere in the medical record under "Patient Instructions". Document created using STT-dictation technology, any transcriptional errors that may result from process are unintentional.    Patient: Teresa Mcgee  Service Category: E/M  Provider: Gaspar Cola, MD  DOB: June 18, 1966  DOS: 05/29/2022  Referring Provider: Abner Greenspan, MD  MRN: [id number]  Specialty: Interventional Pain Management  PCP: Abner Greenspan, MD  Type: Established Patient  Setting: Ambulatory outpatient    Location: Office  Delivery: Face-to-face     HPI  [name], a 56 y.o. year old female, is here today because of her No primary diagnosis found.. Ms. Vetter's primary complain today is No chief complaint on file. Last encounter: My last encounter with her was on 02/27/2022. Pertinent problems: Ms. Lhommedieu has Muscle spasms of neck; Numbness and tingling in both hands; Chronic low back pain (Bilateral) (L>R) w/ sciatica (Bilateral); Chronic lower extremity pain (2ry area of Pain) (Bilateral) (L>R); Chronic neck pain (3ry area of Pain) (Bilateral) (L>R); Chronic pain syndrome; Chronic upper extremity pain (4th area of Pain) (Bilateral) (L>R); Cervicalgia; Chronic musculoskeletal pain; DDD (degenerative disc disease), thoracic; DDD (degenerative disc disease), cervical; Lumbar facet arthropathy (Bilateral); Lumbar facet syndrome (Bilateral); Chronic low back pain (1ry area of Pain) (Bilateral) (L>R) w/o sciatica; Spondylosis without myelopathy or radiculopathy, cervical region; Spondylosis without myelopathy or radiculopathy, lumbosacral region; DDD (degenerative disc disease), lumbosacral; Strain of lumbar paraspinal muscle, sequela; Chronic hip pain (Bilateral)  (R>L); Chronic sacroiliac joint pain (Left); Neurogenic pain; Osteoarthritis involving multiple joints; and Chronic knee pain (Bilateral) (R>L) on their pertinent problem list. Pain Assessment: Severity of   is reported as a  /10. Location:    / . Onset:  . Quality:  . Timing:  . Modifying factor(s):  Marland Kitchen Vitals:  vitals were not taken for this visit.   Reason for encounter:  *** . ***  Pharmacotherapy Assessment  Analgesic: Oxycodone IR 5 mg, 1 tab PO q 8 hrs (PRN) (15 mg/day of oxycodone) MME/day: 22.5 mg/day.   Monitoring: [place] PMP: PDMP reviewed during this encounter.       Pharmacotherapy: No side-effects or adverse reactions reported. Compliance: No problems identified. Effectiveness: Clinically acceptable.  No notes on file  No results found for: "CBDTHCR" No results found for: "D8THCCBX" No results found for: "D9THCCBX"  UDS:  Summary  Date Value Ref Range Status  02/27/2022 Note  Final    Comment:    ==================================================================== [employer] [facility] (MW) ==================================================================== Test                             Result       Flag       Units  Drug Present and Declared for Prescription Verification   Oxymorphone                    446          EXPECTED   ng/mg creat    Sources of oxymorphone include scheduled prescription medications;    it is also an expected metabolite of oxycodone.  Drug Absent but Declared for Prescription Verification   Oxycodone                      Not  Detected UNEXPECTED ng/mg creat    Oxycodone is almost always present in patients taking this drug    consistently.  Absence of oxycodone could be due to lapse of time    since the last dose or unusual pharmacokinetics (rapid metabolism).  ==================================================================== Test                      Result    Flag   Units      Ref Range   Creatinine              28               mg/dL       >=20 ==================================================================== Declared Medications:  The flagging and interpretation on this report are based on the  following declared medications.  Unexpected results may arise from  inaccuracies in the declared medications.   **Note: The testing scope of this panel includes these medications:   Oxycodone (Roxicodone)   **Note: The testing scope of this panel does not include the  following reported medications:   Albuterol (Ventolin HFA)  Amlodipine (Norvasc)  Azelastine (Optivar)  Bempedoic Acid (Nexletol)  Eye Drop  Ezetimibe (Zetia)  Fluticasone (Flonase)  Gabapentin (Neurontin)  Hydrochlorothiazide (Hydrodiuril)  Levocetirizine (Xyzal)  Levothyroxine (Synthroid)  Magnesium (Mag-Ox)  Meloxicam (Mobic)  Metformin (Glucophage)  Olopatadine  Potassium (Klor-Con)  Rosuvastatin (Crestor)  Tizanidine (Zanaflex)  Vitamin D3 ==================================================================== For clinical consultation, please call [phone]. ====================================================================       ROS  Constitutional: Denies any fever or chills Gastrointestinal: No reported hemesis, hematochezia, vomiting, or acute GI distress Musculoskeletal: Denies any acute onset joint swelling, redness, loss of ROM, or weakness Neurological: No reported episodes of acute onset apraxia, aphasia, dysarthria, agnosia, amnesia, paralysis, loss of coordination, or loss of consciousness  Medication Review  Accu-Chek FastClix Lancets, Bempedoic Acid, Carboxymethylcellulose Sod PF, Cholecalciferol, Magnesium Oxide -Mg Supplement, Olopatadine HCl, albuterol, amLODipine, azelastine, blood glucose meter kit and supplies, ezetimibe, fluticasone, gabapentin, glucose blood, hydrochlorothiazide, levocetirizine, levothyroxine, meloxicam, metFORMIN, oxyCODONE, and potassium chloride  History Review  Allergy: Ms. Remmel is  allergic to statins. Drug: Ms. Kellett  reports no history of drug use. Alcohol:  reports no history of alcohol use. Tobacco:  reports that she has been smoking cigarettes. She has a 10.00 pack-year smoking history. She has never used smokeless tobacco. Social: Ms. Jakubek  reports that she has been smoking cigarettes. She has a 10.00 pack-year smoking history. She has never used smokeless tobacco. She reports that she does not drink alcohol and does not use drugs. Medical:  has a past medical history of Arthritis, Bell's palsy (may 2012), Chest pain, Diabetes mellitus without complication ([place]), GERD (gastroesophageal reflux disease), H/O hiatal hernia, Hyperlipidemia, Hypertension, Hypothyroidism, Shingles, Shortness of breath, and Sleep apnea. Surgical: Ms. Kuch  has a past surgical history that includes Thyroidectomy (1998); cesarian; Partial hysterectomy (2000); Cesarean section; Tubal ligation; Tooth extraction; Abdominal hysterectomy; Colonoscopy with propofol (N/A, 11/27/2019); and Esophagogastroduodenoscopy (egd) with propofol (N/A, 07/20/2020). Family: family history includes Breast cancer in her maternal aunt; Diabetes in her brother and sister; Heart attack (age of onset: 67) in her brother; Heart attack (age of onset: 9) in her sister; Heart attack (age of onset: 71) in her father; Stroke (age of onset: 22) in her brother; Stroke (age of onset: 29) in her sister.  Laboratory Chemistry Profile   Renal Lab Results  Component Value Date   BUN 9 03/09/2022   CREATININE 0.94 03/09/2022  BCR 8 (L) 01/24/2018   GFR 68.26 03/09/2022   GFRAA >60 05/25/2018   GFRNONAA >60 05/25/2018    Hepatic Lab Results  Component Value Date   AST 133 (H) 04/24/2022   ALT 86 (H) 04/24/2022   ALBUMIN 4.4 04/24/2022   ALKPHOS 72 04/24/2022   LIPASE 25 05/25/2018    Electrolytes Lab Results  Component Value Date   NA 140 03/09/2022   K 3.7 03/09/2022   CL 100 03/09/2022   CALCIUM 9.9 03/09/2022    MG 1.7 02/21/2022    Bone Lab Results  Component Value Date   VD25OH 68.72 08/19/2021   25OHVITD1 9.3 (L) 01/24/2018   25OHVITD2 <1.0 01/24/2018   25OHVITD3 9.3 01/24/2018    Inflammation (CRP: Acute Phase) (ESR: Chronic Phase) Lab Results  Component Value Date   CRP 5.5 (H) 01/24/2018   ESRSEDRATE 40 01/24/2018         Note: Above Lab results reviewed.  Recent Imaging Review  US Abdomen Complete CLINICAL DATA:  Elevated LFTs  EXAM: ABDOMEN ULTRASOUND COMPLETE  COMPARISON:  Abdominal ultrasound 06/12/2007  FINDINGS: Gallbladder: No gallstones or wall thickening visualized. No sonographic Murphy sign noted by sonographer.  Common bile duct: Diameter: 3 mm  Liver: Coarse, increased echogenicity of the parenchyma with no focal mass identified. Portal vein is patent on color Doppler imaging with normal direction of blood flow towards the liver.  IVC: No abnormality visualized.  Pancreas: Not well visualized.  Spleen: Size and appearance within normal limits.  Right Kidney: Length: 10.9 cm. Echogenicity within normal limits. No mass or hydronephrosis visualized.  Left Kidney: Length: 11.2 cm. Echogenicity within normal limits. No mass or hydronephrosis visualized.  Abdominal aorta: No aneurysm visualized.  Other findings: None.  IMPRESSION: Abnormal appearance of the liver parenchyma suggesting hepatic steatosis and/or other hepatocellular disease.  Electronically Signed   By: Ofilia Neas M.D.   On: 02/02/2022 11:13 Note: Reviewed        Physical Exam  General appearance: Well nourished, well developed, and well hydrated. In no apparent acute distress Mental status: Alert, oriented x 3 (person, place, & time)       Respiratory: No evidence of acute respiratory distress Eyes: PERLA Vitals: LMP  (LMP Unknown)  BMI: Estimated body mass index is 41.45 kg/m as calculated from the following:   Height as of 03/13/22: _0  (1.6 m).   Weight as of  03/13/22: 234 lb (106.1 kg). Ideal: Patient weight not recorded  Assessment   Diagnosis Status  No diagnosis found. Controlled Controlled Controlled   Updated Problems: No problems updated.   Plan of Care  Problem-specific:  No problem-specific Assessment & Plan notes found for this encounter.  Ms. JAMIRAH ZELAYA has a current medication list which includes the following long-term medication(s): albuterol, amlodipine, ezetimibe, fluticasone, gabapentin, hydrochlorothiazide, levocetirizine, levothyroxine, magnesium oxide -mg supplement, meloxicam, metformin, oxycodone, oxycodone, oxycodone, and potassium chloride.  Pharmacotherapy (Medications Ordered): No orders of the defined types were placed in this encounter.  Orders:  No orders of the defined types were placed in this encounter.  Follow-up plan:   No follow-ups on file.     Interventional management options:  Considering:   NOTE: NO RFA until BMI <35  Possible bilateral lumbar facet RFA  Diagnostic left-sided sacroiliac joint block  Possible left-sided sacroiliac joint RFA  Diagnostic bilateral IA hip joint injection  Diagnostic bilateral femoral nerve + obturator NB  Possible bilateral femoral nerve + obturator nerve RFA  Diagnostic left-sided LESI  Diagnostic bilateral transforaminal ESI  Diagnostic left-sided CESI  Diagnostic bilateral cervical facet block  Possible bilateral cervical facet RFA  Diagnostic bilateral IA knee joint injections with local anesthetic and steroid  Possible series of 5 bilateral intra-articular Hyalgan knee injections  Diagnostic bilateral genicular nerve blocks  Possible bilateral genicular nerve RFA  Diagnostic trigger point injections    Palliative PRN treatment(s):   Palliative bilateral lumbar facet block       Recent Visits Date Type Provider Dept  02/27/22 Office Visit Milinda Pointer, MD [facility]  Showing recent visits within past 90 days and meeting  all other requirements Future Appointments Date Type Provider Dept  05/29/22 Appointment Milinda Pointer, MD [facility]  Showing future appointments within next 90 days and meeting all other requirements  I discussed the assessment and treatment plan with the patient. The patient was provided an opportunity to ask questions and all were answered. The patient agreed with the plan and demonstrated an understanding of the instructions.  Patient advised to call back or seek an in-person evaluation if the symptoms or condition worsens.  Duration of encounter: *** minutes.  Total time on encounter, as per AMA guidelines included both the face-to-face and non-face-to-face time personally spent by the physician and/or other qualified health care professional(s) on the day of the encounter (includes time in activities that require the physician or other qualified health care professional and does not include time in activities normally performed by clinical staff). Physician's time may include the following activities when performed: preparing to see the patient (eg, review of tests, pre-charting review of records) obtaining and/or reviewing separately obtained history performing a medically appropriate examination and/or evaluation counseling and educating the patient/family/caregiver ordering medications, tests, or procedures referring and communicating with other health care professionals (when not separately reported) documenting clinical information in the electronic or other health record independently interpreting results (not separately reported) and communicating results to the patient/ family/caregiver care coordination (not separately reported)  Note by: Gaspar Cola, MD Date: 05/29/2022; Time: 7:40 AM

## 2022-05-28 NOTE — Patient Instructions (Signed)
Naloxone Nasal Spray What is this medication? NALOXONE (nal OX one) treats opioid overdose, which causes slow or shallow breathing, severe drowsiness, or trouble staying awake. Call emergency services after using this medication. You may need additional treatment. Naloxone works by reversing the effects of opioids. It belongs to a group of medications called opioid blockers. This medicine may be used for other purposes; ask your health care provider or pharmacist if you have questions. COMMON BRAND NAME(S): Kloxxado, Narcan What should I tell my care team before I take this medication? They need to know if you have any of these conditions: Heart disease Substance use disorder An unusual or allergic reaction to naloxone, other medications, foods, dyes, or preservatives Pregnant or trying to get pregnant Breast-feeding How should I use this medication? This medication is for use in the nose. Lay the person on their back. Support their neck with your hand and allow the head to tilt back before giving the medication. The nasal spray should be given into 1 nostril. After giving the medication, move the person onto their side. Do not remove or test the nasal spray until ready to use. Get emergency medical help right away after giving the first dose of this medication, even if the person wakes up. You should be familiar with how to recognize the signs and symptoms of a narcotic overdose. If more doses are needed, give the additional dose in the other nostril. Talk to your care team about the use of this medication in children. While this medication may be prescribed for children as young as newborns for selected conditions, precautions do apply. Overdosage: If you think you have taken too much of this medicine contact a poison control center or emergency room at once. NOTE: This medicine is only for you. Do not share this medicine with others. What if I miss a dose? This does not apply. What may  interact with this medication? This is only used during an emergency. No interactions are expected during emergency use. This list may not describe all possible interactions. Give your health care provider a list of all the medicines, herbs, non-prescription drugs, or dietary supplements you use. Also tell them if you smoke, drink alcohol, or use illegal drugs. Some items may interact with your medicine. What should I watch for while using this medication? Keep this medication ready for use in the case of an opioid overdose. Make sure that you have the phone number of your care team and local hospital ready. You may need to have additional doses of this medication. Each nasal spray contains a single dose. Some emergencies may require additional doses. After use, bring the treated person to the nearest hospital or call 911. Make sure the treating care team knows that the person has received a dose of this medication. You will receive additional instructions on what to do during and after use of this medication before an emergency occurs. What side effects may I notice from receiving this medication? Side effects that you should report to your care team as soon as possible: Allergic reactions--skin rash, itching, hives, swelling of the face, lips, tongue, or throat Side effects that usually do not require medical attention (report these to your care team if they continue or are bothersome): Constipation Dryness or irritation inside the nose Headache Increase in blood pressure Muscle spasms Stuffy nose Toothache This list may not describe all possible side effects. Call your doctor for medical advice about side effects. You may report side effects to FDA  at 1-800-FDA-1088. Where should I keep my medication? Keep out of the reach of children and pets. Store between 20 and 25 degrees C (68 and 77 degrees F). Do not freeze. Throw away any unused medication after the expiration date. Keep in original  box until ready to use. NOTE: This sheet is a summary. It may not cover all possible information. If you have questions about this medicine, talk to your doctor, pharmacist, or health care provider.  2023 Elsevier/Gold Standard (2021-06-27 00:00:00) ____________________________________________________________________________________________  Medication Rules  Purpose: To inform patients, and their family members, of our rules and regulations.  Applies to: All patients receiving prescriptions (written or electronic).  Pharmacy of record: Pharmacy where electronic prescriptions will be sent. If written prescriptions are taken to a different pharmacy, please inform the nursing staff. The pharmacy listed in the electronic medical record should be the one where you would like electronic prescriptions to be sent.  Electronic prescriptions: In compliance with the [place] (STOP) Act of 2017 (Session Lanny Cramp [phone]), effective August 14, 2018, all controlled substances must be electronically prescribed. Calling prescriptions to the pharmacy will cease to exist.  Prescription refills: Only during scheduled appointments. Applies to all prescriptions.  NOTE: The following applies primarily to controlled substances (Opioid* Pain Medications).   Type of encounter (visit): For patients receiving controlled substances, face-to-face visits are required. (Not an option or up to the patient.)  Patient's responsibilities: Pain Pills: Bring all pain pills to every appointment (except for procedure appointments). Pill Bottles: Bring pills in original pharmacy bottle. Always bring the newest bottle. Bring bottle, even if empty. Medication refills: You are responsible for knowing and keeping track of what medications you take and those you need refilled. The day before your appointment: write a list of all prescriptions that need to be refilled. The day of the  appointment: give the list to the admitting nurse. Prescriptions will be written only during appointments. No prescriptions will be written on procedure days. If you forget a medication: it will not be "Called in", "Faxed", or "electronically sent". You will need to get another appointment to get these prescribed. No early refills. Do not call asking to have your prescription filled early. Prescription Accuracy: You are responsible for carefully inspecting your prescriptions before leaving our office. Have the discharge nurse carefully go over each prescription with you, before taking them home. Make sure that your name is accurately spelled, that your address is correct. Check the name and dose of your medication to make sure it is accurate. Check the number of pills, and the written instructions to make sure they are clear and accurate. Make sure that you are given enough medication to last until your next medication refill appointment. Taking Medication: Take medication as prescribed. When it comes to controlled substances, taking less pills or less frequently than prescribed is permitted and encouraged. Never take more pills than instructed. Never take medication more frequently than prescribed.  Inform other Doctors: Always inform, all of your healthcare providers, of all the medications you take. Pain Medication from other Providers: You are not allowed to accept any additional pain medication from any other Doctor or Healthcare provider. There are two exceptions to this rule. (see below) In the event that you require additional pain medication, you are responsible for notifying us, as stated below. Cough Medicine: Often these contain an opioid, such as codeine or hydrocodone. Never accept or take cough medicine containing these opioids if you are already taking  an opioid* medication. The combination may cause respiratory failure and death. Medication Agreement: You are responsible for carefully  reading and following our Medication Agreement. This must be signed before receiving any prescriptions from our practice. Safely store a copy of your signed Agreement. Violations to the Agreement will result in no further prescriptions. (Additional copies of our Medication Agreement are available upon request.) Laws, Rules, & Regulations: All patients are expected to follow all Federal and [employer], Rules, [employer] [facility]. Ignorance of the Laws does not constitute a valid excuse.  Illegal drugs and Controlled Substances: The use of illegal substances (including, but not limited to marijuana and its derivatives) and/or the illegal use of any controlled substances is strictly prohibited. Violation of this rule may result in the immediate and permanent discontinuation of any and all prescriptions being written by our practice. The use of any illegal substances is prohibited. Adopted CDC guidelines & recommendations: Target dosing levels will be at or below 60 MME/day. Use of benzodiazepines** is not recommended.  Exceptions: There are only two exceptions to the rule of not receiving pain medications from other Healthcare Providers. Exception #1 (Emergencies): In the event of an emergency (i.e.: accident requiring emergency care), you are allowed to receive additional pain medication. However, you are responsible for: As soon as you are able, call our office [phone], at any time of the day or night, and leave a message stating your name, the date and nature of the emergency, and the name and dose of the medication prescribed. In the event that your call is answered by a member of our staff, make sure to document and save the date, time, and the name of the person that took your information.  Exception #2 (Planned Surgery): In the event that you are scheduled by another doctor or dentist to have any type of surgery or procedure, you are allowed (for a period no longer than 30 days), to receive  additional pain medication, for the acute post-op pain. However, in this case, you are responsible for picking up a copy of our "Post-op Pain Management for Surgeons" handout, and giving it to your surgeon or dentist. This document is available at our office, and does not require an appointment to obtain it. Simply go to our office during business hours (Monday-Thursday from 8:00 AM to 4:00 PM) (Friday 8:00 AM to 12:00 Noon) or if you have a scheduled appointment with [place], prior to your surgery, and ask for it by name. In addition, you are responsible for: calling our office [phone], at any time of the day or night, and leaving a message stating your name, name of your surgeon, type of surgery, and date of procedure or surgery. Failure to comply with your responsibilities may result in termination of therapy involving the controlled substances. Medication Agreement Violation. Following the above rules, including your responsibilities will help you in avoiding a Medication Agreement Violation ("Breaking your Pain Medication Contract").  *Opioid medications include: morphine, codeine, oxycodone, oxymorphone, hydrocodone, hydromorphone, meperidine, tramadol, tapentadol, buprenorphine, fentanyl, methadone. **Benzodiazepine medications include: diazepam (Valium), alprazolam (Xanax), clonazepam (Klonopine), lorazepam (Ativan), clorazepate (Tranxene), chlordiazepoxide (Librium), estazolam (Prosom), oxazepam (Serax), temazepam (Restoril), triazolam (Halcion) (Last updated: 05/11/2021) ____________________________________________________________________________________________  ____________________________________________________________________________________________  Medication Recommendations and Reminders  Applies to: All patients receiving prescriptions (written and/or electronic).  Medication Rules & Regulations: These rules and regulations exist for your safety and that of others. They are not  flexible and neither are we. Dismissing or ignoring them will be considered "non-compliance"  with medication therapy, resulting in complete and irreversible termination of such therapy. (See document titled "Medication Rules" for more details.) In all conscience, because of safety reasons, we cannot continue providing a therapy where the patient does not follow instructions.  Pharmacy of record:  Definition: This is the pharmacy where your electronic prescriptions will be sent.  We do not endorse any particular pharmacy, however, we have experienced problems with [facility] not securing enough medication supply for the community. We do not restrict you in your choice of pharmacy. However, once we write for your prescriptions, we will NOT be re-sending more prescriptions to fix restricted supply problems created by your pharmacy, or your insurance.  The pharmacy listed in the electronic medical record should be the one where you want electronic prescriptions to be sent. If you choose to change pharmacy, simply notify our nursing staff.  Recommendations: Keep all of your pain medications in a safe place, under lock and key, even if you live alone. We will NOT replace lost, stolen, or damaged medication. After you fill your prescription, take 1 week's worth of pills and put them away in a safe place. You should keep a separate, properly labeled bottle for this purpose. The remainder should be kept in the original bottle. Use this as your primary supply, until it runs out. Once it's gone, then you know that you have 1 week's worth of medicine, and it is time to come in for a prescription refill. If you do this correctly, it is unlikely that you will ever run out of medicine. To make sure that the above recommendation works, it is very important that you make sure your medication refill appointments are scheduled at least 1 week before you run out of medicine. To do this in an effective manner, make sure that  you do not leave the office without scheduling your next medication management appointment. Always ask the nursing staff to show you in your prescription , when your medication will be running out. Then arrange for the receptionist to get you a return appointment, at least 7 days before you run out of medicine. Do not wait until you have 1 or 2 pills left, to come in. This is very poor planning and does not take into consideration that we may need to cancel appointments due to bad weather, sickness, or emergencies affecting our staff. DO NOT ACCEPT A "Partial Fill": If for any reason your pharmacy does not have enough pills/tablets to completely fill or refill your prescription, do not allow for a "partial fill". The law allows the pharmacy to complete that prescription within 72 hours, without requiring a new prescription. If they do not fill the rest of your prescription within those 72 hours, you will need a separate prescription to fill the remaining amount, which we will NOT provide. If the reason for the partial fill is your insurance, you will need to talk to the pharmacist about payment alternatives for the remaining tablets, but again, DO NOT ACCEPT A PARTIAL FILL, unless you can trust your pharmacist to obtain the remainder of the pills within 72 hours.  Prescription refills and/or changes in medication(s):  Prescription refills, and/or changes in dose or medication, will be conducted only during scheduled medication management appointments. (Applies to both, written and electronic prescriptions.) No refills on procedure days. No medication will be changed or started on procedure days. No changes, adjustments, and/or refills will be conducted on a procedure day. Doing so will interfere with the diagnostic portion  of the procedure. No phone refills. No medications will be "called into the pharmacy". No Fax refills. No weekend refills. No Holliday refills. No after hours refills.  Remember:   Business hours are:  Monday to Thursday 8:00 AM to 4:00 PM Provider's Schedule: Milinda Pointer, MD - Appointments are:  Medication management: Monday and Wednesday 8:00 AM to 4:00 PM Procedure day: Tuesday and Thursday 7:30 AM to 4:00 PM Gillis Santa, MD - Appointments are:  Medication management: Tuesday and Thursday 8:00 AM to 4:00 PM Procedure day: Monday and Wednesday 7:30 AM to 4:00 PM (Last update: 03/03/2020) ____________________________________________________________________________________________  ____________________________________________________________________________________________  Pharmacy Shortages of Pain Medication   Introduction Shockingly as it may seem, .  "No U.[place] Supreme Court decision has ever interpreted the Constitution as guaranteeing a right to health care for all Americans." - https://huff.com/  "With respect to human rights, the [place] has no formally codified right to health, nor does it participate in a human rights treaty that specifies a right to health." - Scott J. Schweikart, JD, MBE  Situation By now, most of our patients have had the experience of being told by their pharmacist that they do not have enough medication to cover their prescription. If you have not had this experience, just know that you soon will.  Problem There appears to be a shortage of these medications, either at the national level or locally. This is happening with all pharmacies. When there is not enough medication, patients are offered a partial fill and they are told that they will try to get the rest of the medicine for them at a later time. If they do not have enough for even a partial fill, the pharmacists are telling the patients to call us (the prescribing physicians) to request that we send another prescription to another pharmacy to get the medicine.   This reordering of a controlled  substance creates documentation problems where additional paperwork needs to be created to explain why two prescriptions for the same period of time and the same medicine are being prescribed to the same patient. It also creates situations where the last appointment note does not accurately reflect when and what prescriptions were given to a patient. This leads to prescribing errors down the line, in subsequent follow-up visits.   [employer] of Pharmacy ([employer]) Research revealed that Surveyor, quantity .1806 (21 NCAC 46.1806) authorizes pharmacists to the transfer of prescriptions among pharmacies, and it sets forth procedural and recordkeeping requirements for doing so. However, this requires the pharmacist to complete the previously mentioned procedural paperwork to accomplish the transfer. As it turns out, it is much easier for them to have the prescribing physicians do the work.   Possible solutions 1. You can ask your physician to assist you in weaning yourself off these medications. 2. Ask your pharmacy if the medication is in stock, 3 days prior to your refill. 3. If you need a pharmacy change, let us know at your medication management visit. Prescriptions that have already been electronically sent to a pharmacy will not be re-sent to a different pharmacy if your pharmacy of record does not have it in stock. Proper stocking of medication is a pharmacy problem, not a prescriber problem. Work with your pharmacist to solve the problem. 4. Have the [facility] Assembly add a provision to the "STOP ACT" (the law that mandates how controlled substances are prescribed) where there is an exception to the electronic prescribing rule that states that in  the event there are shortages of medications the physicians are allowed to use written prescriptions as opposed to electronic ones. This would allow patients to take their prescriptions to a different pharmacy that may have enough  medication available to fill the prescription. The problem is that currently there is a law that does not allow for written prescriptions, with the exception of instances where the electronic medical record is down due to technical issues.  5. Have [place] Congress ease the pressure on pharmaceutical companies, allowing them to produce enough quantities of the medication to adequately supply the population. 6. Have pharmacies keep enough stocks of these medications to cover their client base.  7. Have the [facility] Assembly add a provision to the "STOP ACT" where they ease the regulations surrounding the transfer of controlled substances between pharmacies, so as to simplify the transfer of supplies. As an alternative, develop a system to allow patients to obtain the remainder of their prescription at another one of their pharmacies or at an associate pharmacy.   How this shortage will affect you.  Understand that this is a pharmacy supply problem, not a prescriber problem. Work with your pharmacy to solve it. The job of the prescriber is to evaluate and monitor the patient for the appropriate indications and use of these medicines. It is not the job of the prescriber to supply the medication or to solve problems with that supply. The responsibility and the choice to obtain the medication resides on the patient. By law, supplying the medication is the job of the pharmacy. It is certainly not the job of the prescriber to solve supply problems.   Due to the above problems we are no longer taking patients to write for their pain medication. Future discussions with your physician may include potentially weaning medications or transitioning to alternatives.  We will be focusing primarily on interventional based pain management. We will continue to evaluate for appropriate indications and we may provide recommendations regarding medication, dose, and schedule, as well as monitoring recommendations, however,  we will not be taking over the actual prescribing of these substances. On those patients where we are treating their chronic pain with interventional therapies, exceptions will be considered on a case by case basis. At this time, we will try to continue providing this supplemental service to those patients we have been managing in the past. However, as of August 1st, 2023, we no longer will be sending additional prescriptions to other pharmacies for the purpose of solving their supply problems. Once we send a prescription to a pharmacy, we will not be resending it again to another pharmacy to cover for their shortages.   What to do. Write as many letters as you can. Recruit the help of family members in writing these letters. Below are some of the places where you can write to make your voice heard. Let them know what the problem is and push them to look for solutions.   Search internet for: "[employer] find your legislators" NoseSwap.is  Search internet for: "[employer] commissioner complaints" Starlas.fi  Search internet for: "[place] complaints" https://www.hernandez-brewer.com/.htm  Search internet for: "[facility] pharmacy complaints" Email [facility] Customer Relations woondaal.com.jsp?callType=store  Search internet for: Programme researcher, broadcasting/film/video customer service complaints" https://www.[facility].com/topic/marketing/contactus/contactus_customerservice.jsp  ____________________________________________________________________________________________  ____________________________________________________________________________________________  Drug Holidays (Slow)  What is a "Drug Holiday"? Drug Holiday: is the name given to the period of time during which a patient stops taking a medication(s) for the purpose of eliminating  tolerance  to the drug.  Benefits Improved effectiveness of opioids. Decreased opioid dose needed to achieve benefits. Improved pain with lesser dose.  What is tolerance? Tolerance: is the progressive decreased in effectiveness of a drug due to its repetitive use. With repetitive use, the body gets use to the medication and as a consequence, it loses its effectiveness. This is a common problem seen with opioid pain medications. As a result, a larger dose of the drug is needed to achieve the same effect that used to be obtained with a smaller dose.  How long should a "Drug Holiday" last? You should stay off of the pain medicine for at least 14 consecutive days. (2 weeks)  Should I stop the medicine "cold [place]"? No. You should always coordinate with your Pain Specialist so that he/she can provide you with the correct medication dose to make the transition as smoothly as possible.  How do I stop the medicine? Slowly. You will be instructed to decrease the daily amount of pills that you take by one (1) pill every seven (7) days. This is called a "slow downward taper" of your dose. For example: if you normally take four (4) pills per day, you will be asked to drop this dose to three (3) pills per day for seven (7) days, then to two (2) pills per day for seven (7) days, then to one (1) per day for seven (7) days, and at the end of those last seven (7) days, this is when the "Drug Holiday" would start.   Will I have withdrawals? By doing a "slow downward taper" like this one, it is unlikely that you will experience any significant withdrawal symptoms. Typically, what triggers withdrawals is the sudden stop of a high dose opioid therapy. Withdrawals can usually be avoided by slowly decreasing the dose over a prolonged period of time. If you do not follow these instructions and decide to stop your medication abruptly, withdrawals may be possible.  What are withdrawals? Withdrawals: refers to the wide range of  symptoms that occur after stopping or dramatically reducing opiate drugs after heavy and prolonged use. Withdrawal symptoms do not occur to patients that use low dose opioids, or those who take the medication sporadically. Contrary to benzodiazepine (example: Valium, Xanax, etc.) or alcohol withdrawals ("Delirium Tremens"), opioid withdrawals are not lethal. Withdrawals are the physical manifestation of the body getting rid of the excess receptors.  Expected Symptoms Early symptoms of withdrawal may include: Agitation Anxiety Muscle aches Increased tearing Insomnia Runny nose Sweating Yawning  Late symptoms of withdrawal may include: Abdominal cramping Diarrhea Dilated pupils Goose bumps Nausea Vomiting  Will I experience withdrawals? Due to the slow nature of the taper, it is very unlikely that you will experience any.  What is a slow taper? Taper: refers to the gradual decrease in dose.  (Last update: 03/03/2020) ____________________________________________________________________________________________

## 2022-05-29 ENCOUNTER — Ambulatory Visit: Payer: Medicare HMO | Attending: Pain Medicine | Admitting: Pain Medicine

## 2022-05-29 VITALS — BP 149/75 | HR 86 | Temp 97.3°F | Resp 16 | Ht 63.0 in | Wt 232.0 lb

## 2022-05-29 DIAGNOSIS — Z79891 Long term (current) use of opiate analgesic: Secondary | ICD-10-CM

## 2022-05-29 DIAGNOSIS — Z79899 Other long term (current) drug therapy: Secondary | ICD-10-CM | POA: Diagnosis not present

## 2022-05-29 DIAGNOSIS — M5137 Other intervertebral disc degeneration, lumbosacral region: Secondary | ICD-10-CM | POA: Diagnosis not present

## 2022-05-29 DIAGNOSIS — M533 Sacrococcygeal disorders, not elsewhere classified: Secondary | ICD-10-CM | POA: Diagnosis not present

## 2022-05-29 DIAGNOSIS — M545 Low back pain, unspecified: Secondary | ICD-10-CM | POA: Diagnosis not present

## 2022-05-29 DIAGNOSIS — M25552 Pain in left hip: Secondary | ICD-10-CM

## 2022-05-29 DIAGNOSIS — M25562 Pain in left knee: Secondary | ICD-10-CM

## 2022-05-29 DIAGNOSIS — M79604 Pain in right leg: Secondary | ICD-10-CM | POA: Diagnosis not present

## 2022-05-29 DIAGNOSIS — M79602 Pain in left arm: Secondary | ICD-10-CM

## 2022-05-29 DIAGNOSIS — M47816 Spondylosis without myelopathy or radiculopathy, lumbar region: Secondary | ICD-10-CM | POA: Diagnosis present

## 2022-05-29 DIAGNOSIS — M25561 Pain in right knee: Secondary | ICD-10-CM

## 2022-05-29 DIAGNOSIS — M25551 Pain in right hip: Secondary | ICD-10-CM | POA: Diagnosis not present

## 2022-05-29 DIAGNOSIS — M79605 Pain in left leg: Secondary | ICD-10-CM | POA: Diagnosis not present

## 2022-05-29 DIAGNOSIS — G8929 Other chronic pain: Secondary | ICD-10-CM

## 2022-05-29 DIAGNOSIS — M79601 Pain in right arm: Secondary | ICD-10-CM

## 2022-05-29 DIAGNOSIS — G894 Chronic pain syndrome: Secondary | ICD-10-CM

## 2022-05-29 DIAGNOSIS — F1120 Opioid dependence, uncomplicated: Secondary | ICD-10-CM | POA: Diagnosis not present

## 2022-05-29 DIAGNOSIS — M542 Cervicalgia: Secondary | ICD-10-CM

## 2022-05-29 MED ORDER — OXYCODONE HCL 5 MG PO TABS: 5.0000 mg | ORAL_TABLET | Freq: Three times a day (TID) | ORAL | 0 refills | Status: DC | PRN

## 2022-05-29 MED ORDER — NALOXONE HCL 4 MG/0.1ML NA LIQD: 1.0000 | NASAL | 0 refills | Status: DC | PRN

## 2022-05-29 NOTE — Progress Notes (Signed)
Nursing Pain Medication Assessment:  Safety precautions to be maintained throughout the outpatient stay will include: orient to surroundings, keep bed in low position, maintain call bell within reach at all times, provide assistance with transfer out of bed and ambulation.  Medication Inspection Compliance: Pill count conducted under aseptic conditions, in front of the patient. Neither the pills nor the bottle was removed from the patient's sight at any time. Once count was completed pills were immediately returned to the patient in their original bottle.  Medication: Oxycodone IR Pill/Patch Count:  15 of 90 pills remain Pill/Patch Appearance: Markings consistent with prescribed medication Bottle Appearance: Standard pharmacy container. Clearly labeled. Filled Date: 97 / 22 / 2023 Last Medication intake:  Yesterday

## 2022-07-24 ENCOUNTER — Encounter: Payer: Self-pay | Admitting: Gastroenterology

## 2022-07-24 ENCOUNTER — Other Ambulatory Visit: Payer: Self-pay

## 2022-07-24 ENCOUNTER — Ambulatory Visit (INDEPENDENT_AMBULATORY_CARE_PROVIDER_SITE_OTHER): Payer: Medicare HMO | Admitting: Gastroenterology

## 2022-07-24 VITALS — BP 133/83 | HR 74 | Temp 97.8°F | Wt 238.2 lb

## 2022-07-24 DIAGNOSIS — Z23 Encounter for immunization: Secondary | ICD-10-CM

## 2022-07-24 DIAGNOSIS — R7989 Other specified abnormal findings of blood chemistry: Secondary | ICD-10-CM

## 2022-07-24 NOTE — Progress Notes (Signed)
Jonathon Bellows MD, MRCP(U.K) [address]  [place]  [place], [place] [address]  Main: [phone]  Fax: [phone]   Primary Care Physician: Tower, Wynelle Fanny, MD  Primary Gastroenterologist:  Dr. Jonathon Bellows   Chief Complaint  Patient presents with   Abnormal LFTs    HPI: Teresa Mcgee is a 56 y.o. female Summary of history : Initially referred and seen back in November 2021 for dysphagia.  History of hiatal hernia, acid reflux for many years at that time she felt like the food got stuck in the center of her chest meats were harder to swallow was on Prilosec 20 mg after her dinner.  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  Biopsies of the gastric antrum showed reactive gastritis. 02/02/2022 right upper quadrant ultrasound showed hepatic steatosis 02/21/2022: AST 112, ALT 73, HbA1c 6.3, LFTs have been elevated for over 3 years but gradually increasing.   Interval history 03/13/2022-12/11/023  At her last visit at we ordered labs  03/13/2022: CK elevated at 202 mild hepatitis A total antibody positive hepatitis B core total antibody negative.  Hepatitis B surface antibody negative surface antigen negative.  Hepatitis C virus antibody negative HIV testing negative.  Smooth muscle antibody positive at 34 mitochondrial antibody negative celiac serology negative No follow-up subsequently.  04/24/2022: AST 133 ALT 86 Gained 4 pounds since last visit  She says her husband has been diagnosed with prostate cancer and is undergoing therapy.     Latest Ref Rng & Units 04/24/2022    2:43 PM 02/21/2022   12:03 PM 01/24/2022   11:36 AM  Hepatic Function  Total Protein 6.5 - 8.1 g/dL 8.6  7.9  8.6   Albumin 3.5 - 5.0 g/dL 4.4  4.7  4.3   AST 15 - 41 U/L 133  112  106   ALT 0 - 44 U/L 86  73  67   Alk Phosphatase 38 - 126 U/L 72  76  69   Total  Bilirubin 0.3 - 1.2 mg/dL 0.6  0.4  0.9   Bilirubin, Direct 0.0 - 0.2 mg/dL 0.1   0.2     Current Outpatient Medications  Medication Sig Dispense Refill   Accu-Chek FastClix Lancets MISC USE UP TO 4 TIMES DAILY AS DIRECTED 102 each 1   ACCU-CHEK GUIDE test strip USE UP TO 4 TIMES DAILY AS DIRECTED 100 strip 5   albuterol (VENTOLIN HFA) 108 (90 Base) MCG/ACT inhaler INHALE 2 PUFFS INTO THE LUNGS EVERY 4 HOURS AS NEEDED FOR (COUGH, SHORTNESS OF BREATH OR WHEEZING) 8.5 each 2   amLODipine (NORVASC) 10 MG tablet TAKE 1 TABLET BY MOUTH EVERY DAY 90 tablet 1   azelastine (OPTIVAR) 0.05 % ophthalmic solution Place 1 drop into both eyes 2 (two) times daily as needed. 6 mL 3   Bempedoic Acid (NEXLETOL) 180 MG TABS TAKE 1 TABLET BY MOUTH EVERY DAY 90 tablet 3   blood glucose meter kit and supplies Dispense based on patient and insurance preference. Use up to four times daily as directed. (FOR ICD-10 E10.9, E11.9). 1 each 0   [employer]S D3 125 MCG (5000 UT) capsule TAKE 1 CAPSULE BY MOUTH DAILY WITH BREAKFAST. TAKE ALONG WITH CALCIUM AND MAGNESIUM.  5   ezetimibe (ZETIA) 10 MG tablet TAKE 1 TABLET BY MOUTH EVERY DAY 90 tablet 1   fluticasone (FLONASE) 50 MCG/ACT nasal spray  SPRAY 2 SPRAYS INTO EACH NOSTRIL EVERY DAY 48 mL 3   gabapentin (NEURONTIN) 100 MG capsule TAKE 1 CAPSULE (100 MG TOTAL) BY MOUTH 2 (TWO) TIMES DAILY AND 3 CAPSULES (300 MG TOTAL) AT BEDTIME. 150 capsule 2   hydrochlorothiazide (HYDRODIURIL) 25 MG tablet TAKE 1 TABLET (25 MG TOTAL) BY MOUTH DAILY. AS DIRECTED 90 tablet 1   levocetirizine (XYZAL) 5 MG tablet TAKE 1 TABLET BY MOUTH EVERY DAY IN THE EVENING 90 tablet 1   levothyroxine (SYNTHROID) 112 MCG tablet TAKE 1 TABLET BY MOUTH EVERY DAY 90 tablet 1   Magnesium Oxide -Mg Supplement 500 MG CAPS TAKE 1 CAPSULE (500 MG TOTAL) BY MOUTH 2 (TWO) TIMES DAILY AT 8 AM AND 10 PM. 180 capsule 1   meloxicam (MOBIC) 15 MG tablet TAKE 1 TABLET BY MOUTH EVERY DAY WITH FOOD AS NEEDED FOR PAIN 90 tablet 1    metFORMIN (GLUCOPHAGE) 500 MG tablet Take 1 tablet (500 mg total) by mouth 2 (two) times daily with a meal. 180 tablet 3   naloxone (NARCAN) nasal spray 4 mg/0.1 mL Place 1 spray into the nose as needed for up to 365 doses (for opioid-induced respiratory depresssion). In case of emergency (overdose), spray once into each nostril. If no response within 3 minutes, repeat application and call [phone]. 1 each 0   Olopatadine HCl 0.2 % SOLN INSTILL 1 DROP INTO BOTH EYES EVERY DAY AS NEEDED     oxyCODONE (OXY IR/ROXICODONE) 5 MG immediate release tablet Take 1 tablet (5 mg total) by mouth every 8 (eight) hours as needed for severe pain. Must last 30 days 90 tablet 0   [START ON 08/03/2022] oxyCODONE (OXY IR/ROXICODONE) 5 MG immediate release tablet Take 1 tablet (5 mg total) by mouth every 8 (eight) hours as needed for severe pain. Must last 30 days 90 tablet 0   potassium chloride (KLOR-CON 10) 10 MEQ tablet Take 1 tablet (10 mEq total) by mouth daily. 30 tablet 11   REFRESH CELLUVISC 1 % GEL APPLY 1 DROP TO EYE 3 (THREE) TIMES DAILY.  12   rosuvastatin (CRESTOR) 5 MG tablet Take 5 mg by mouth every other day.     oxyCODONE (OXY IR/ROXICODONE) 5 MG immediate release tablet Take 1 tablet (5 mg total) by mouth every 8 (eight) hours as needed for severe pain. Must last 30 days 90 tablet 0   No current facility-administered medications for this visit.    Allergies as of 07/24/2022 - Review Complete 07/24/2022  Allergen Reaction Noted   Statins  08/19/2021    ROS:  General: Negative for anorexia, weight loss, fever, chills, fatigue, weakness. ENT: Negative for hoarseness, difficulty swallowing , nasal congestion. CV: Negative for chest pain, angina, palpitations, dyspnea on exertion, peripheral edema.  Respiratory: Negative for dyspnea at rest, dyspnea on exertion, cough, sputum, wheezing.  GI: See history of present illness. GU:  Negative for dysuria, hematuria, urinary incontinence, urinary  frequency, nocturnal urination.  Endo: Negative for unusual weight change.    Physical Examination:   BP 133/83   Pulse 74   Temp 97.8 F (36.6 C) (Oral)   Wt 238 lb 3.2 oz (108 kg)   LMP  (LMP Unknown)   BMI 42.20 kg/m   General: Well-nourished, well-developed in no acute distress.  Eyes: No icterus. Conjunctivae pink. Skin: Warm and dry, no jaundice.   Psych: Alert and cooperative, normal mood and affect.   Imaging Studies: No results found.  Assessment and Plan:   Florena Kozma  Kreiger is a 56 y.o. y/o female  for dysphagia Schatzki's ring dilated in 2021 here today referred for abnormal LFTs ultrasound showed hepatic steatosis.  Gradual rise in transaminases.  HbA1c 6.3.  She weighs 232 pounds.  Likely has metabolic syndrome and nonalcoholic fatty liver disease. Immune to hepatitis A not immune to hepatitis B   Plan 1.  Requires hepatitis B vaccine 2.  Smooth muscle antibody is positive.  She could have a false positive smooth muscle antibody or coexisting hepatic steatosis as well as autoimmune hepatitis . A liver biopsy would help, this would potentially help with guiding therapy.  She is willing to have it scheduled in February 3.  Advised to continue to lose weight eat healthy and exercise.   Dr Jonathon Bellows  MD,MRCP [facility]) Follow up in 3 to 4 months

## 2022-07-24 NOTE — Addendum Note (Signed)
Addended by: Wayna Chalet on: 07/24/2022 03:45 PM   Modules accepted: Orders

## 2022-07-31 ENCOUNTER — Encounter: Payer: Self-pay | Admitting: Internal Medicine

## 2022-07-31 ENCOUNTER — Ambulatory Visit: Payer: Medicare HMO | Attending: Internal Medicine | Admitting: Internal Medicine

## 2022-07-31 VITALS — BP 133/83 | Wt 238.0 lb

## 2022-07-31 DIAGNOSIS — E785 Hyperlipidemia, unspecified: Secondary | ICD-10-CM

## 2022-07-31 DIAGNOSIS — M791 Myalgia, unspecified site: Secondary | ICD-10-CM

## 2022-07-31 DIAGNOSIS — R748 Abnormal levels of other serum enzymes: Secondary | ICD-10-CM | POA: Diagnosis not present

## 2022-07-31 DIAGNOSIS — I739 Peripheral vascular disease, unspecified: Secondary | ICD-10-CM

## 2022-07-31 DIAGNOSIS — T466X5D Adverse effect of antihyperlipidemic and antiarteriosclerotic drugs, subsequent encounter: Secondary | ICD-10-CM

## 2022-07-31 NOTE — Patient Instructions (Signed)
Medication Instructions:  NO CHANGES -- will advise after lab results  *If you need a refill on your cardiac medications before your next appointment, please call your pharmacy*   Lab Work: FASTING lab work to check cholesterol and liver enzymes   If you have labs (blood work) drawn today and your tests are completely normal, you will receive your results only by: [place] (if you have MyChart) OR A paper copy in the mail If you have any lab test that is abnormal or we need to change your treatment, we will call you to review the results.   Testing/Procedures: NONE   Follow-Up: At [facility], you and your health needs are our priority.  As part of our continuing mission to provide you with exceptional heart care, we have created designated Provider Care Teams.  These Care Teams include your primary Cardiologist (physician) and Advanced Practice Providers (APPs -  Physician Assistants and Nurse Practitioners) who all work together to provide you with the care you need, when you need it.  We recommend signing up for the patient portal called "MyChart".  Sign up information is provided on this After Visit Summary.  MyChart is used to connect with patients for Virtual Visits (Telemedicine).  Patients are able to view lab/test results, encounter notes, upcoming appointments, etc.  Non-urgent messages can be sent to your provider as well.   To learn more about what you can do with MyChart, go to [email or web address].    Your next appointment:   6 month(s)  The format for your next appointment:   In Person or Virtual/Video   Provider:   Lyman Bishop MD - lipid clinic

## 2022-07-31 NOTE — Progress Notes (Signed)
Virtual Visit via Video Note   This visit type was conducted due to national recommendations for restrictions regarding the COVID-19 Pandemic (e.g. social distancing) in an effort to limit this patient's exposure and mitigate transmission in our community.  Due to her co-morbid illnesses, this patient is at least at moderate risk for complications without adequate follow up.  This format is felt to be most appropriate for this patient at this time.  The patient does have access to video technology  All issues noted in this document were discussed and addressed.  A limited physical exam could be performed with this format.  Please refer to the patient's chart for her  consent to telehealth for [facility].   Date:  07/31/2022   ID:  Teresa Mcgee, DOB 04/14/1966, MRN [id number] The patient was identified using 2 identifiers.  Evaluation Performed:  Follow-Up Visit  Patient Location:  [address] [place] [address]  Provider location:   [address], [place], [place] [address]  PCP:  Abner Greenspan, MD  Cardiologist:  None Electrophysiologist:  None   Chief Complaint:  Manage dyslipidemia  History of Present Illness:    Teresa Mcgee is a 56 y.o. female who presents via audio/video conferencing for a telehealth visit today.  This is a pleasant 56 year old female with type 2 diabetes, dyslipidemia, hypertension and history of statin intolerance to both atorvastatin and simvastatin, who presents for evaluation and management of dyslipidemia.  She recently had a lipid profile which demonstrated a total cholesterol of 204, triglycerides 117, HDL 46 and LDL 134.  Hemoglobin A1c is 5.8.  She has no known history of heart disease although she had a brother and father both who had heart disease in their 81s.  A CT scan of the abdomen and pelvis was done along with a CT angio of the chest in October 2019 for shortness of breath and upper abdominal pain,  this did not suggest any coronary artery calcification however there is evidence for "peripheral calcified atherosclerotic plaque".  Based on this, she does need aggressive lipid-lowering and would target an LDL to less than 70.  Unfortunately she has not been able to tolerate statins having myalgias.  She is hesitant to try another statin at this point.  She was placed on ezetimibe however her cholesterol remains above target.  We discussed options today.  11/24/2020  Teresa Mcgee seems to be doing well on Nexletol.  She is tolerating it without any significant side effects.  Repeat labs 6 days ago showed total cholesterol 143, triglycerides 175, HDL 34 and LDL at 79.  This is just slightly above goal although she has been more active recently and has made some dietary changes.  I suspect hopefully over the next several months that her cholesterol will become even lower.  09/19/2021  Teresa Mcgee is seen today in follow-up.  Unfortunately her lipids have gone up in the past 6 months.  We are hopeful that with dietary changes and weight loss she would reach her target on Nexletol and ezetimibe, however her most recent lipids demonstrated total cholesterol 167, triglycerides 164, HDL 34 and LDL 100.  Her goal LDL is less than 70 given PAD.  Her TSH was also a little suppressed therefore her thyroid medication was reduced.  This would actually be usually associated with a lower than expected cholesterol.  Therefore I anticipated going higher.  She will need additional therapy.  01/24/2022  Teresa Mcgee is  seen today in follow-up.  She reports she is tolerating rosuvastatin 5 mg every other day in addition to ezetimibe and Nexletol.  She did not have her lipids reassessed.  She will plan to get them done in [place] in the next few days.  She is wondering if she needs to continue all of them.  Overall I think if she is tolerating it well we could consider combining the Nexletol and Zetia use her pill  burden.  07/31/2022  Teresa Mcgee returns today for follow-up.  Since I last saw her she has had an increase in her liver enzymes.  The slow rise now shows AST 133 and ALT 86.  Based on this and the fact that her LDL was well-controlled in July at 46, advised her to stop her every other day rosuvastatin but to continue ezetimibe and Nexletol.  She did see a gastroenterologist and had an autoimmune workup.  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  She may undergo liver biopsy in February.  Since she has now been off of her statin.  I advised repeating her lipid profile and her liver enzymes.   Prior CV studies:   The following studies were reviewed today:  Chart reviewed, lab work reviewed  PMHx:  Past Medical History:  Diagnosis Date   Arthritis    Bell's palsy may 2012   Chest pain    Diabetes mellitus without complication (HCC)    GERD (gastroesophageal reflux disease)    H/O hiatal hernia    Hyperlipidemia    Hypertension    Hypothyroidism    Shingles    Shortness of breath    Sleep apnea    uses cpap    Past Surgical History:  Procedure Laterality Date   ABDOMINAL HYSTERECTOMY     CESAREAN SECTION     cesarian     3x   COLONOSCOPY WITH PROPOFOL N/A 11/27/2019   Procedure: COLONOSCOPY WITH PROPOFOL;  Surgeon: Jonathon Bellows, MD;  Location: [facility] ENDOSCOPY;  Service: Gastroenterology;  Laterality: N/A;   ESOPHAGOGASTRODUODENOSCOPY (EGD) WITH PROPOFOL N/A 07/20/2020   Procedure: ESOPHAGOGASTRODUODENOSCOPY (EGD) WITH PROPOFOL;  Surgeon: Jonathon Bellows, MD;  Location[facility] ENDOSCOPY;  Service: Gastroenterology;  Laterality: N/A;   PARTIAL HYSTERECTOMY  2000   abnormal uterine bleeding   THYROIDECTOMY  1998   TOOTH EXTRACTION     TUBAL LIGATION      FAMHx:  Family History  Problem Relation Age of Onset   Heart attack Father 35       deceased   Diabetes Brother    Stroke Brother 88       twice   Stroke Sister 45   Diabetes Sister    Heart attack Sister 13    Heart attack Brother 39   Breast cancer Maternal Aunt     SOCHx:   reports that she has been smoking cigarettes. She has a 10.00 pack-year smoking history. She has never used smokeless tobacco. She reports that she does not drink alcohol and does not use drugs.  ALLERGIES:  Allergies  Allergen Reactions   Statins     myopathy    MEDS:  Current Meds  Medication Sig   Accu-Chek FastClix Lancets MISC USE UP TO 4 TIMES DAILY AS DIRECTED   ACCU-CHEK GUIDE test strip USE UP TO 4 TIMES DAILY AS DIRECTED   albuterol (VENTOLIN HFA) 108 (90 Base) MCG/ACT inhaler INHALE 2 PUFFS INTO THE LUNGS EVERY 4 HOURS AS NEEDED FOR (COUGH, SHORTNESS OF BREATH OR WHEEZING)  amLODipine (NORVASC) 10 MG tablet TAKE 1 TABLET BY MOUTH EVERY DAY   azelastine (OPTIVAR) 0.05 % ophthalmic solution Place 1 drop into both eyes 2 (two) times daily as needed.   Bempedoic Acid (NEXLETOL) 180 MG TABS TAKE 1 TABLET BY MOUTH EVERY DAY   blood glucose meter kit and supplies Dispense based on patient and insurance preference. Use up to four times daily as directed. (FOR ICD-10 E10.9, E11.9).   CVS D3 125 MCG (5000 UT) capsule TAKE 1 CAPSULE BY MOUTH DAILY WITH BREAKFAST. TAKE ALONG WITH CALCIUM AND MAGNESIUM.   ezetimibe (ZETIA) 10 MG tablet TAKE 1 TABLET BY MOUTH EVERY DAY   fluticasone (FLONASE) 50 MCG/ACT nasal spray SPRAY 2 SPRAYS INTO EACH NOSTRIL EVERY DAY   hydrochlorothiazide (HYDRODIURIL) 25 MG tablet TAKE 1 TABLET (25 MG TOTAL) BY MOUTH DAILY. AS DIRECTED   levocetirizine (XYZAL) 5 MG tablet TAKE 1 TABLET BY MOUTH EVERY DAY IN THE EVENING   levothyroxine (SYNTHROID) 112 MCG tablet TAKE 1 TABLET BY MOUTH EVERY DAY   Magnesium Oxide -Mg Supplement 500 MG CAPS TAKE 1 CAPSULE (500 MG TOTAL) BY MOUTH 2 (TWO) TIMES DAILY AT 8 AM AND 10 PM.   meloxicam (MOBIC) 15 MG tablet TAKE 1 TABLET BY MOUTH EVERY DAY WITH FOOD AS NEEDED FOR PAIN   metFORMIN (GLUCOPHAGE) 500 MG tablet Take 1 tablet (500 mg total) by mouth 2 (two)  times daily with a meal.   naloxone (NARCAN) nasal spray 4 mg/0.1 mL Place 1 spray into the nose as needed for up to 365 doses (for opioid-induced respiratory depresssion). In case of emergency (overdose), spray once into each nostril. If no response within 3 minutes, repeat application and call [phone].   Olopatadine HCl 0.2 % SOLN INSTILL 1 DROP INTO BOTH EYES EVERY DAY AS NEEDED   oxyCODONE (OXY IR/ROXICODONE) 5 MG immediate release tablet Take 1 tablet (5 mg total) by mouth every 8 (eight) hours as needed for severe pain. Must last 30 days   [START ON 08/03/2022] oxyCODONE (OXY IR/ROXICODONE) 5 MG immediate release tablet Take 1 tablet (5 mg total) by mouth every 8 (eight) hours as needed for severe pain. Must last 30 days   potassium chloride (KLOR-CON 10) 10 MEQ tablet Take 1 tablet (10 mEq total) by mouth daily.   REFRESH CELLUVISC 1 % GEL APPLY 1 DROP TO EYE 3 (THREE) TIMES DAILY.     ROS: Pertinent items noted in HPI and remainder of comprehensive ROS otherwise negative.  Labs/Other Tests and Data Reviewed:    Recent Labs: 08/19/2021: Hemoglobin 13.8; Platelets 188.0 10/07/2021: TSH 0.56 02/21/2022: Magnesium 1.7 03/09/2022: BUN 9; Creatinine, Ser 0.94; Potassium 3.7; Sodium 140 04/24/2022: ALT 86   Recent Lipid Panel Lab Results  Component Value Date/Time   CHOL 111 02/21/2022 12:03 PM   CHOL 143 11/18/2020 10:55 AM   TRIG 137.0 02/21/2022 12:03 PM   HDL 36.50 (L) 02/21/2022 12:03 PM   HDL 34 (L) 11/18/2020 10:55 AM   CHOLHDL 3 02/21/2022 12:03 PM   LDLCALC 47 02/21/2022 12:03 PM   LDLCALC 79 11/18/2020 10:55 AM   LDLCALC 159 (H) 11/16/2017 03:29 PM    Wt Readings from Last 3 Encounters:  07/31/22 238 lb (108 kg)  07/24/22 238 lb 3.2 oz (108 kg)  05/29/22 232 lb (105.2 kg)     Exam:    Vital Signs:  BP 133/83   Wt 238 lb (108 kg)   LMP  (LMP Unknown)   BMI 42.16 kg/m  General appearance: alert, no distress and morbidly obese Lungs: No visual respiratory  difficulty Abdomen: Obese Extremities: extremities normal, atraumatic, no cyanosis or edema Skin: Skin color, texture, turgor normal. No rashes or lesions Neurologic: Grossly normal Psych: Pleasant  ASSESSMENT & PLAN:    Mixed dyslipidemia, goal LDL less than 70 Type 2 diabetes Hypertension Family history of premature coronary disease PAD with calcified peripheral atherosclerosis Statin intolerant-myalgias Elevated liver enzymes  Teresa Mcgee has recently had elevated liver enzymes.  I would agree it is more likely related to metabolic syndrome/fatty liver disease.  She was however found to have an elevated smooth muscle antibody of unknown significance.  She says she has previously had her thyroid removed.  I advise stopping her statin about a month ago.  Will repeat her liver enzymes and her lipid profile since it was last assessed in July.  LDL at that time was low at 46.  Follow-up with me in 6 months or sooner as necessary.  COVID-19 Education: The signs and symptoms of COVID-19 were discussed with the patient and how to seek care for testing (follow up with PCP or arrange E-visit).  The importance of social distancing was discussed today.  Patient Risk:   After full review of this patients clinical status, I feel that they are at least moderate risk at this time.  Time:   Today, I have spent 25 minutes with the patient with telehealth technology discussing dyslipidemia, goal LDL cholesterol.     Medication Adjustments/Labs and Tests Ordered: Current medicines are reviewed at length with the patient today.  Concerns regarding medicines are outlined above.   Tests Ordered: No orders of the defined types were placed in this encounter.   Medication Changes: No orders of the defined types were placed in this encounter.   Disposition:  in 6 month(s)  Pixie Casino, MD, [facility], [place] Director of the Advanced Lipid Disorders &   Cardiovascular Risk Reduction Clinic Diplomate of the American Board of Clinical Lipidology Attending Cardiologist  Direct Dial: [phone]  Fax: [phone]  Website:  [email or web address]  Pixie Casino, MD  07/31/2022 9:14 AM

## 2022-08-01 ENCOUNTER — Other Ambulatory Visit
Admission: RE | Admit: 2022-08-01 | Discharge: 2022-08-01 | Disposition: A | Discharge status: Home / Self Care | Payer: Medicare HMO | Attending: Internal Medicine | Admitting: Internal Medicine

## 2022-08-01 DIAGNOSIS — E785 Hyperlipidemia, unspecified: Secondary | ICD-10-CM | POA: Insufficient documentation

## 2022-08-01 LAB — HEPATIC FUNCTION PANEL
ALT: 85 U/L — ABNORMAL HIGH (ref 0–44)
AST: 128 U/L — ABNORMAL HIGH (ref 15–41)
Albumin: 4.3 g/dL (ref 3.5–5.0)
Alkaline Phosphatase: 67 U/L (ref 38–126)
Bilirubin, Direct: 0.1 mg/dL (ref 0.0–0.2)
Indirect Bilirubin: 0.6 mg/dL (ref 0.3–0.9)
Total Bilirubin: 0.7 mg/dL (ref 0.3–1.2)
Total Protein: 8.6 g/dL — ABNORMAL HIGH (ref 6.5–8.1)

## 2022-08-01 LAB — LIPID PANEL
Cholesterol: 146 mg/dL (ref 0–200)
HDL: 31 mg/dL — ABNORMAL LOW (ref >40)
LDL Cholesterol: 81 mg/dL (ref 0–99)
Total CHOL/HDL Ratio: 4.7 RATIO
Triglycerides: 169 mg/dL — ABNORMAL HIGH (ref <150)
VLDL: 34 mg/dL (ref 0–40)

## 2022-08-03 ENCOUNTER — Encounter: Payer: Self-pay | Admitting: Podiatry

## 2022-08-03 ENCOUNTER — Ambulatory Visit (INDEPENDENT_AMBULATORY_CARE_PROVIDER_SITE_OTHER): Payer: Medicare HMO | Admitting: Podiatry

## 2022-08-03 DIAGNOSIS — B351 Tinea unguium: Secondary | ICD-10-CM | POA: Diagnosis not present

## 2022-08-03 DIAGNOSIS — R200 Anesthesia of skin: Secondary | ICD-10-CM

## 2022-08-03 DIAGNOSIS — M79674 Pain in right toe(s): Secondary | ICD-10-CM

## 2022-08-03 DIAGNOSIS — R202 Paresthesia of skin: Secondary | ICD-10-CM | POA: Diagnosis not present

## 2022-08-03 DIAGNOSIS — E119 Type 2 diabetes mellitus without complications: Secondary | ICD-10-CM

## 2022-08-03 DIAGNOSIS — M79675 Pain in left toe(s): Secondary | ICD-10-CM

## 2022-08-03 NOTE — Progress Notes (Signed)
This patient returns to my office for at risk foot care.  This patient requires this care by a professional since this patient will be at risk due to having diabetes.  This patient is unable to cut nails herself since the patient cannot reach her nails.These nails are painful walking and wearing shoes.  This patient presents for at risk foot care today.  General Appearance  Alert, conversant and in no acute stress.  Vascular  Dorsalis pedis   pulses are palpable  bilaterally.   Posterior tibial pulses are absent  B/L. Capillary return is within normal limits  bilaterally. Cold feet   Bilaterally.  Absent hair  B/L.  Neurologic  Senn-Weinstein monofilament wire test within normal limits  bilaterally. Muscle power within normal limits bilaterally.  Nails Thick disfigured discolored nails with subungual debris  from hallux to fifth toes bilaterally. No evidence of bacterial infection or drainage bilaterally.  Orthopedic  No limitations of motion  feet .  No crepitus or effusions noted.  No bony pathology or digital deformities noted.  Skin  normotropic skin with no porokeratosis noted bilaterally.  No signs of infections or ulcers noted.     Onychomycosis  Pain in right toes  Pain in left toes  Consent was obtained for treatment procedures.   Mechanical debridement of nails 1-5  bilaterally performed with a nail nipper.  Filed with dremel without incident.    Return office visit   3 months                   Told patient to return for periodic foot care and evaluation due to potential at risk complications.   Gardiner Barefoot DPM

## 2022-08-13 ENCOUNTER — Other Ambulatory Visit: Payer: Self-pay | Admitting: Family Medicine

## 2022-08-13 DIAGNOSIS — I10 Essential (primary) hypertension: Secondary | ICD-10-CM

## 2022-08-15 ENCOUNTER — Other Ambulatory Visit: Payer: Self-pay | Admitting: Family Medicine

## 2022-08-15 DIAGNOSIS — J302 Other seasonal allergic rhinitis: Secondary | ICD-10-CM

## 2022-08-20 NOTE — Progress Notes (Signed)
PROVIDER NOTE: Information contained herein reflects review and annotations entered in association with encounter. Interpretation of such information and data should be left to medically-trained personnel. Information provided to patient can be located elsewhere in the medical record under "Patient Instructions". Document created using STT-dictation technology, any transcriptional errors that may result from process are unintentional.    Patient: Teresa Mcgee  Service Category: E/M  Provider: Oswaldo Done, MD  DOB: Sep 20, 1965  DOS: 08/23/2022  Referring Provider: Judy Pimple, MD  MRN: [id number]  Specialty: Interventional Pain Management  PCP: Judy Pimple, MD  Type: Established Patient  Setting: Ambulatory outpatient    Location: Office  Delivery: Face-to-face     HPI  [name], a 57 y.o. year old female, is here today because of her Chronic pain syndrome [G89.4]. Teresa Mcgee primary complain today is Back Pain (lower) Last encounter: My last encounter with her was on 05/29/2022. Pertinent problems: Teresa Mcgee has Muscle spasms of neck; Numbness and tingling in both hands; Chronic low back pain (Bilateral) (L>R) w/ sciatica (Bilateral); Chronic lower extremity pain (2ry area of Pain) (Bilateral) (L>R); Chronic neck pain (3ry area of Pain) (Bilateral) (L>R); Chronic pain syndrome; Chronic upper extremity pain (4th area of Pain) (Bilateral) (L>R); Cervicalgia; Chronic musculoskeletal pain; DDD (degenerative disc disease), thoracic; DDD (degenerative disc disease), cervical; Lumbar facet arthropathy (Bilateral); Lumbar facet syndrome (Bilateral); Chronic low back pain (1ry area of Pain) (Bilateral) (L>R) w/o sciatica; Spondylosis without myelopathy or radiculopathy, cervical region; Spondylosis without myelopathy or radiculopathy, lumbosacral region; DDD (degenerative disc disease), lumbosacral; Strain of lumbar paraspinal muscle, sequela; Chronic hip pain (Bilateral) (R>L);  Chronic sacroiliac joint pain (Left); Neurogenic pain; Osteoarthritis involving multiple joints; and Chronic knee pain (Bilateral) (R>L) on their pertinent problem list. Pain Assessment: Severity of Chronic pain is reported as a 4 /10. Location: Back (neck arm and fingers) Right, Left, Lower, Mid, Upper/pain radiaities everywhere. Onset: More than a month ago. Quality: Aching, Burning, Constant, Discomfort, Nagging, Throbbing, Stabbing, Sharp, Tingling. Timing: Constant. Modifying factor(s): laying down and meds. Vitals:  height is 5\' 3"  (1.6 m) and weight is 236 lb (107 kg). Her temperature is 97 F (36.1 C) (abnormal). Her blood pressure is 139/65 and her pulse is 79. Her oxygen saturation is 100%.  BMI: Estimated body mass index is 41.81 kg/m as calculated from the following:   Height as of this encounter: 5\' 3"  (1.6 m).   Weight as of this encounter: 236 lb (107 kg).  Reason for encounter: medication management.  The patient indicates doing well with the current medication regimen. No adverse reactions or side effects reported to the medications.   RTCB: 12/01/2022  Nonopioids transfer 08/11/2020: Gabapentin, meloxicam, and Zanaflex  Pharmacotherapy Assessment  Analgesic: Oxycodone IR 5 mg, 1 tab PO q 8 hrs (PRN) (15 mg/day of oxycodone) MME/day: 22.5 mg/day.   Monitoring: [place] PMP: PDMP reviewed during this encounter.       Pharmacotherapy: No side-effects or adverse reactions reported. Compliance: No problems identified. Effectiveness: Clinically acceptable.  Brigitte Pulse, RN  08/23/2022 12:44 PM  Sign when Signing Visit Nursing Pain Medication Assessment:  Safety precautions to be maintained throughout the outpatient stay will include: orient to surroundings, keep bed in low position, maintain call bell within reach at all times, provide assistance with transfer out of bed and ambulation.  Medication Inspection Compliance: Pill count conducted under aseptic conditions, in front of  the patient. Neither the pills nor the bottle was removed from the  patient's sight at any time. Once count was completed pills were immediately returned to the patient in their original bottle.  Medication: Oxycodone IR Pill/Patch Count:  27 of 90 pills remain Pill/Patch Appearance: Markings consistent with prescribed medication Bottle Appearance: Standard pharmacy container. Clearly labeled. Filled Date: 4 / 21 / 2023 Last Medication intake:  YesterdaySafety precautions to be maintained throughout the outpatient stay will include: orient to surroundings, keep bed in low position, maintain call bell within reach at all times, provide assistance with transfer out of bed and ambulation.     No results found for: "CBDTHCR" No results found for: "D8THCCBX" No results found for: "D9THCCBX"  UDS:  Summary  Date Value Ref Range Status  02/27/2022 Note  Final    Comment:    ==================================================================== [employer] Select 13 (MW) ==================================================================== Test                             Result       Flag       Units  Drug Present and Declared for Prescription Verification   Oxymorphone                    446          EXPECTED   ng/mg creat    Sources of oxymorphone include scheduled prescription medications;    it is also an expected metabolite of oxycodone.  Drug Absent but Declared for Prescription Verification   Oxycodone                      Not Detected UNEXPECTED ng/mg creat    Oxycodone is almost always present in patients taking this drug    consistently.  Absence of oxycodone could be due to lapse of time    since the last dose or unusual pharmacokinetics (rapid metabolism).  ==================================================================== Test                      Result    Flag   Units      Ref Range   Creatinine              28               mg/dL       >=95 ==================================================================== Declared Medications:  The flagging and interpretation on this report are based on the  following declared medications.  Unexpected results may arise from  inaccuracies in the declared medications.   **Note: The testing scope of this panel includes these medications:   Oxycodone (Roxicodone)   **Note: The testing scope of this panel does not include the  following reported medications:   Albuterol (Ventolin HFA)  Amlodipine (Norvasc)  Azelastine (Optivar)  Bempedoic Acid (Nexletol)  Eye Drop  Ezetimibe (Zetia)  Fluticasone (Flonase)  Gabapentin (Neurontin)  Hydrochlorothiazide (Hydrodiuril)  Levocetirizine (Xyzal)  Levothyroxine (Synthroid)  Magnesium (Mag-Ox)  Meloxicam (Mobic)  Metformin (Glucophage)  Olopatadine  Potassium (Klor-Con)  Rosuvastatin (Crestor)  Tizanidine (Zanaflex)  Vitamin D3 ==================================================================== For clinical consultation, please call [phone]. ====================================================================       ROS  Constitutional: Denies any fever or chills Gastrointestinal: No reported hemesis, hematochezia, vomiting, or acute GI distress Musculoskeletal: Denies any acute onset joint swelling, redness, loss of ROM, or weakness Neurological: No reported episodes of acute onset apraxia, aphasia, dysarthria, agnosia, amnesia, paralysis, loss of coordination, or loss of consciousness  Medication Review  Accu-Chek FastClix  Lancets, Bempedoic Acid, Carboxymethylcellulose Sod PF, Cholecalciferol, Magnesium Oxide -Mg Supplement, Olopatadine HCl, albuterol, amLODipine, azelastine, blood glucose meter kit and supplies, ezetimibe, fluticasone, gabapentin, glucose blood, hydrochlorothiazide, levocetirizine, levothyroxine, meloxicam, metFORMIN, naloxone, oxyCODONE, and potassium chloride  History Review  Allergy: Teresa Mcgee is  allergic to statins. Drug: Teresa Mcgee  reports no history of drug use. Alcohol:  reports no history of alcohol use. Tobacco:  reports that she has been smoking cigarettes. She has a 10.00 pack-year smoking history. She has never used smokeless tobacco. Social: Teresa Mcgee  reports that she has been smoking cigarettes. She has a 10.00 pack-year smoking history. She has never used smokeless tobacco. She reports that she does not drink alcohol and does not use drugs. Medical:  has a past medical history of Arthritis, Bell's palsy (may 2012), Chest pain, Diabetes mellitus without complication (HCC), GERD (gastroesophageal reflux disease), H/O hiatal hernia, Hyperlipidemia, Hypertension, Hypothyroidism, Shingles, Shortness of breath, and Sleep apnea. Surgical: Teresa Mcgee  has a past surgical history that includes Thyroidectomy (1998); cesarian; Partial hysterectomy (2000); Cesarean section; Tubal ligation; Tooth extraction; Abdominal hysterectomy; Colonoscopy with propofol (N/A, 11/27/2019); and Esophagogastroduodenoscopy (egd) with propofol (N/A, 07/20/2020). Family: family history includes Breast cancer in her maternal aunt; Diabetes in her brother and sister; Heart attack (age of onset: 8) in her brother; Heart attack (age of onset: 73) in her sister; Heart attack (age of onset: 80) in her father; Stroke (age of onset: 53) in her brother; Stroke (age of onset: 55) in her sister.  Laboratory Chemistry Profile   Renal Lab Results  Component Value Date   BUN 9 03/09/2022   CREATININE 0.94 03/09/2022   BCR 8 (L) 01/24/2018   GFR 68.26 03/09/2022   GFRAA >60 05/25/2018   GFRNONAA >60 05/25/2018    Hepatic Lab Results  Component Value Date   AST 128 (H) 08/01/2022   ALT 85 (H) 08/01/2022   ALBUMIN 4.3 08/01/2022   ALKPHOS 67 08/01/2022   LIPASE 25 05/25/2018    Electrolytes Lab Results  Component Value Date   NA 140 03/09/2022   K 3.7 03/09/2022   CL 100 03/09/2022   CALCIUM 9.9 03/09/2022    MG 1.7 02/21/2022    Bone Lab Results  Component Value Date   VD25OH 68.72 08/19/2021   25OHVITD1 9.3 (L) 01/24/2018   25OHVITD2 <1.0 01/24/2018   25OHVITD3 9.3 01/24/2018    Inflammation (CRP: Acute Phase) (ESR: Chronic Phase) Lab Results  Component Value Date   CRP 5.5 (H) 01/24/2018   ESRSEDRATE 40 01/24/2018         Note: Above Lab results reviewed.  Recent Imaging Review  US Abdomen Complete CLINICAL DATA:  Elevated LFTs  EXAM: ABDOMEN ULTRASOUND COMPLETE  COMPARISON:  Abdominal ultrasound 06/12/2007  FINDINGS: Gallbladder: No gallstones or wall thickening visualized. No sonographic Murphy sign noted by sonographer.  Common bile duct: Diameter: 3 mm  Liver: Coarse, increased echogenicity of the parenchyma with no focal mass identified. Portal vein is patent on color Doppler imaging with normal direction of blood flow towards the liver.  IVC: No abnormality visualized.  Pancreas: Not well visualized.  Spleen: Size and appearance within normal limits.  Right Kidney: Length: 10.9 cm. Echogenicity within normal limits. No mass or hydronephrosis visualized.  Left Kidney: Length: 11.2 cm. Echogenicity within normal limits. No mass or hydronephrosis visualized.  Abdominal aorta: No aneurysm visualized.  Other findings: None.  IMPRESSION: Abnormal appearance of the liver parenchyma suggesting hepatic steatosis and/or other hepatocellular disease.  Electronically Signed  By: Jannifer Hick M.D.   On: 02/02/2022 11:13 Note: Reviewed        Physical Exam  General appearance: Well nourished, well developed, and well hydrated. In no apparent acute distress Mental status: Alert, oriented x 3 (person, place, & time)       Respiratory: No evidence of acute respiratory distress Eyes: PERLA Vitals: BP 139/65   Pulse 79   Temp (!) 97 F (36.1 C)   Ht 5\' 3"  (1.6 m)   Wt 236 lb (107 kg)   LMP  (LMP Unknown)   SpO2 100%   BMI 41.81 kg/m  BMI:  Estimated body mass index is 41.81 kg/m as calculated from the following:   Height as of this encounter: 5\' 3"  (1.6 m).   Weight as of this encounter: 236 lb (107 kg). Ideal: Ideal body weight: 52.4 kg (115 lb 8.3 oz) Adjusted ideal body weight: 74.3 kg (163 lb 11.4 oz)  Assessment   Diagnosis Status  1. Chronic pain syndrome   2. Chronic low back pain (1ry area of Pain) (Bilateral) (L>R) w/o sciatica   3. Chronic lower extremity pain (2ry area of Pain) (Bilateral) (L>R)   4. Chronic neck pain (3ry area of Pain) (Bilateral) (L>R)   5. Chronic upper extremity pain (4th area of Pain) (Bilateral) (L>R)   6. DDD (degenerative disc disease), lumbosacral   7. Chronic sacroiliac joint pain (Left)   8. Lumbar facet syndrome (Bilateral)   9. Chronic knee pain (Bilateral) (R>L)   10. Chronic hip pain (Bilateral) (R>L)   11. Pharmacologic therapy   12. Chronic use of opiate for therapeutic purpose   13. Uncomplicated opioid dependence (HCC)   14. Encounter for medication management   15. Encounter for chronic pain management    Controlled Controlled Controlled   Updated Problems: No problems updated.  Plan of Care  Problem-specific:  No problem-specific Assessment & Plan notes found for this encounter.  Teresa Mcgee has a current medication list which includes the following long-term medication(s): albuterol, amlodipine, ezetimibe, fluticasone, hydrochlorothiazide, levocetirizine, levothyroxine, magnesium oxide -mg supplement, meloxicam, metformin, potassium chloride, gabapentin, [START ON 09/02/2022] oxycodone, [START ON 10/02/2022] oxycodone, and [START ON 11/01/2022] oxycodone.  Pharmacotherapy (Medications Ordered): Meds ordered this encounter  Medications   oxyCODONE (OXY IR/ROXICODONE) 5 MG immediate release tablet    Sig: Take 1 tablet (5 mg total) by mouth every 8 (eight) hours as needed for severe pain. Must last 30 days    Dispense:  90 tablet    Refill:  0    DO NOT:  delete (not duplicate); no partial-fill (will deny script to complete), no refill request (F/U required). DISPENSE: 1 day early if closed on fill date. WARN: No CNS-depressants within 8 hrs of med.   oxyCODONE (OXY IR/ROXICODONE) 5 MG immediate release tablet    Sig: Take 1 tablet (5 mg total) by mouth every 8 (eight) hours as needed for severe pain. Must last 30 days    Dispense:  90 tablet    Refill:  0    DO NOT: delete (not duplicate); no partial-fill (will deny script to complete), no refill request (F/U required). DISPENSE: 1 day early if closed on fill date. WARN: No CNS-depressants within 8 hrs of med.   oxyCODONE (OXY IR/ROXICODONE) 5 MG immediate release tablet    Sig: Take 1 tablet (5 mg total) by mouth every 8 (eight) hours as needed for severe pain. Must last 30 days    Dispense:  90 tablet  Refill:  0    DO NOT: delete (not duplicate); no partial-fill (will deny script to complete), no refill request (F/U required). DISPENSE: 1 day early if closed on fill date. WARN: No CNS-depressants within 8 hrs of med.   Orders:  No orders of the defined types were placed in this encounter.  Follow-up plan:   Return in about 3 months (around 12/01/2022) for Eval-day (M,W), (F2F), (MM).     Interventional Therapies  Risk Factors  Considerations:  NOTE: NO RFA until BMI <35    Planned  Pending:      Under consideration:   Therapeutic/palliative bilateral lumbar facet MBB #3  Possible bilateral lumbar facet RFA #1    Completed:   Diagnostic bilateral lumbar facet MBB x2 (07/23/2018) (100/100/100/0)    Completed by other providers:   None at this time   Therapeutic  Palliative (PRN) options:   Palliative bilateral lumbar facet block    Pharmacotherapy  Nonopioids transfer 08/11/2020: Gabapentin, meloxicam, and Zanaflex      Recent Visits Date Type Provider Dept  05/29/22 Office Visit Delano Metz, MD [facility]  Showing recent visits within past 90 days  and meeting all other requirements Today's Visits Date Type Provider Dept  08/23/22 Office Visit Delano Metz, MD [facility]  Showing today's visits and meeting all other requirements Future Appointments No visits were found meeting these conditions. Showing future appointments within next 90 days and meeting all other requirements  I discussed the assessment and treatment plan with the patient. The patient was provided an opportunity to ask questions and all were answered. The patient agreed with the plan and demonstrated an understanding of the instructions.  Patient advised to call back or seek an in-person evaluation if the symptoms or condition worsens.  Duration of encounter: 30 minutes.  Total time on encounter, as per AMA guidelines included both the face-to-face and non-face-to-face time personally spent by the physician and/or other qualified health care professional(s) on the day of the encounter (includes time in activities that require the physician or other qualified health care professional and does not include time in activities normally performed by clinical staff). Physician's time may include the following activities when performed: Preparing to see the patient (e.g., pre-charting review of records, searching for previously ordered imaging, lab work, and nerve conduction tests) Review of prior analgesic pharmacotherapies. Reviewing PMP Interpreting ordered tests (e.g., lab work, imaging, nerve conduction tests) Performing post-procedure evaluations, including interpretation of diagnostic procedures Obtaining and/or reviewing separately obtained history Performing a medically appropriate examination and/or evaluation Counseling and educating the patient/family/caregiver Ordering medications, tests, or procedures Referring and communicating with other health care professionals (when not separately reported) Documenting clinical information in the electronic  or other health record Independently interpreting results (not separately reported) and communicating results to the patient/ family/caregiver Care coordination (not separately reported)  Note by: Oswaldo Done, MD Date: 08/23/2022; Time: 12:53 PM

## 2022-08-23 ENCOUNTER — Encounter: Payer: Self-pay | Admitting: Pain Medicine

## 2022-08-23 ENCOUNTER — Ambulatory Visit: Payer: Medicare HMO | Attending: Pain Medicine | Admitting: Pain Medicine

## 2022-08-23 VITALS — BP 139/65 | HR 79 | Temp 97.0°F | Ht 63.0 in | Wt 236.0 lb

## 2022-08-23 DIAGNOSIS — Z79899 Other long term (current) drug therapy: Secondary | ICD-10-CM | POA: Insufficient documentation

## 2022-08-23 DIAGNOSIS — M47816 Spondylosis without myelopathy or radiculopathy, lumbar region: Secondary | ICD-10-CM | POA: Insufficient documentation

## 2022-08-23 DIAGNOSIS — M79605 Pain in left leg: Secondary | ICD-10-CM | POA: Insufficient documentation

## 2022-08-23 DIAGNOSIS — M25561 Pain in right knee: Secondary | ICD-10-CM | POA: Diagnosis not present

## 2022-08-23 DIAGNOSIS — Z79891 Long term (current) use of opiate analgesic: Secondary | ICD-10-CM | POA: Diagnosis present

## 2022-08-23 DIAGNOSIS — M545 Low back pain, unspecified: Secondary | ICD-10-CM | POA: Diagnosis not present

## 2022-08-23 DIAGNOSIS — M5137 Other intervertebral disc degeneration, lumbosacral region: Secondary | ICD-10-CM | POA: Insufficient documentation

## 2022-08-23 DIAGNOSIS — F1120 Opioid dependence, uncomplicated: Secondary | ICD-10-CM | POA: Insufficient documentation

## 2022-08-23 DIAGNOSIS — M25562 Pain in left knee: Secondary | ICD-10-CM | POA: Insufficient documentation

## 2022-08-23 DIAGNOSIS — M25552 Pain in left hip: Secondary | ICD-10-CM | POA: Diagnosis present

## 2022-08-23 DIAGNOSIS — G894 Chronic pain syndrome: Secondary | ICD-10-CM | POA: Insufficient documentation

## 2022-08-23 DIAGNOSIS — G8929 Other chronic pain: Secondary | ICD-10-CM | POA: Insufficient documentation

## 2022-08-23 DIAGNOSIS — M79601 Pain in right arm: Secondary | ICD-10-CM | POA: Diagnosis not present

## 2022-08-23 DIAGNOSIS — M542 Cervicalgia: Secondary | ICD-10-CM | POA: Insufficient documentation

## 2022-08-23 DIAGNOSIS — M25551 Pain in right hip: Secondary | ICD-10-CM | POA: Diagnosis present

## 2022-08-23 DIAGNOSIS — M79604 Pain in right leg: Secondary | ICD-10-CM | POA: Diagnosis not present

## 2022-08-23 DIAGNOSIS — M79602 Pain in left arm: Secondary | ICD-10-CM | POA: Insufficient documentation

## 2022-08-23 DIAGNOSIS — M533 Sacrococcygeal disorders, not elsewhere classified: Secondary | ICD-10-CM | POA: Diagnosis not present

## 2022-08-23 MED ORDER — OXYCODONE HCL 5 MG PO TABS: 5.0000 mg | ORAL_TABLET | Freq: Three times a day (TID) | ORAL | 0 refills | Status: DC | PRN

## 2022-08-23 NOTE — Progress Notes (Signed)
Nursing Pain Medication Assessment:  Safety precautions to be maintained throughout the outpatient stay will include: orient to surroundings, keep bed in low position, maintain call bell within reach at all times, provide assistance with transfer out of bed and ambulation.  Medication Inspection Compliance: Pill count conducted under aseptic conditions, in front of the patient. Neither the pills nor the bottle was removed from the patient's sight at any time. Once count was completed pills were immediately returned to the patient in their original bottle.  Medication: Oxycodone IR Pill/Patch Count:  27 of 90 pills remain Pill/Patch Appearance: Markings consistent with prescribed medication Bottle Appearance: Standard pharmacy container. Clearly labeled. Filled Date: 54 / 21 / 2023 Last Medication intake:  YesterdaySafety precautions to be maintained throughout the outpatient stay will include: orient to surroundings, keep bed in low position, maintain call bell within reach at all times, provide assistance with transfer out of bed and ambulation.

## 2022-08-23 NOTE — Patient Instructions (Signed)
____________________________________________________________________________________________  Patient Information update  To: All of our patients.  Re: Name change.  It has been made official that our current name, "[facility]"   will soon be changed to "[facility]".   The purpose of this change is to eliminate any confusion created by the concept of our practice being a "Medication Management Pain Clinic". In the past this has led to the misconception that we treat pain primarily by the use of prescription medications.  Nothing can be farther from the truth.   Understanding PAIN MANAGEMENT: To further understand what our practice does, you first have to understand that "Pain Management" is a subspecialty that requires additional training once a physician has completed their specialty training, which can be in either Anesthesia, Neurology, Psychiatry, or Physical Medicine and Rehabilitation (PMR). Each one of these contributes to the final approach taken by each physician to the management of their patient's pain. To be a "Pain Management Specialist" you must have first completed one of the specialty trainings below.  Anesthesiologists - trained in clinical pharmacology and interventional techniques such as nerve blockade and regional as well as central neuroanatomy. They are trained to block pain before, during, and after surgical interventions.  Neurologists - trained in the diagnosis and pharmacological treatment of complex neurological conditions, such as Multiple Sclerosis, Parkinson's, spinal cord injuries, and other systemic conditions that may be associated with symptoms that may include but are not limited to pain. They tend to rely primarily on the treatment of chronic pain using prescription medications.  Psychiatrist - trained in conditions affecting the psychosocial  wellbeing of patients including but not limited to depression, anxiety, schizophrenia, personality disorders, addiction, and other substance use disorders that may be associated with chronic pain. They tend to rely primarily on the treatment of chronic pain using prescription medications.   Physical Medicine and Rehabilitation (PMR) physicians, also known as physiatrists - trained to treat a wide variety of medical conditions affecting the brain, spinal cord, nerves, bones, joints, ligaments, muscles, and tendons. Their training is primarily aimed at treating patients that have suffered injuries that have caused severe physical impairment. Their training is primarily aimed at the physical therapy and rehabilitation of those patients. They may also work alongside orthopedic surgeons or neurosurgeons using their expertise in assisting surgical patients to recover after their surgeries.  INTERVENTIONAL PAIN MANAGEMENT is sub-subspecialty of Pain Management.  Our physicians are Board-certified in Anesthesia, Pain Management, and Interventional Pain Management.  This meaning that not only have they been trained and Board-certified in their specialty of Anesthesia, and subspecialty of Pain Management, but they have also received further training in the sub-subspecialty of Interventional Pain Management, in order to become Board-certified as INTERVENTIONAL PAIN MANAGEMENT SPECIALIST.    Mission: Our goal is to use our skills in  INTERVENTIONAL PAIN MANAGEMENT as alternatives to the chronic use of prescription opioid medications for the treatment of pain. To make this more clear, we have changed our name to reflect what we do and offer. We will continue to offer medication management assessment and recommendations, but we will not be taking over any patient's medication management.  ____________________________________________________________________________________________      ____________________________________________________________________________________________  National Pain Medication Shortage  The [place] is experiencing worsening drug shortages. These have had a negative widespread effect on patient care and treatment. Not expected to improve any time soon. Predicted to last past 2029.   Drug shortage list (generic   names) Oxycodone IR Oxycodone/APAP Oxymorphone IR Hydromorphone Hydrocodone/APAP Morphine  Where is the problem?  Manufacturing and supply level.  Will this shortage affect you?  Only if you take any of the above pain medications.  How? You may be unable to fill your prescription.  Your pharmacist may offer a "partial fill" of your prescription. (Warning: Do not accept partial fills.) Prescriptions partially filled cannot be transferred to another pharmacy. Read our Medication Rules and Regulation. Depending on how much medicine you are dependent on, you may experience withdrawals when unable to get the medication.  Recommendations: Consider ending your dependence on opioid pain medications. Ask your pain specialist to assist you with the process. Consider switching to a medication currently not in shortage, such as Buprenorphine. Talk to your pain specialist about this option. Consider decreasing your pain medication requirements by managing tolerance thru "Drug Holidays". This may help minimize withdrawals, should you run out of medicine. Control your pain thru the use of non-pharmacological interventional therapies.   Your prescriber: Prescribers cannot be blamed for shortages. Medication manufacturing and supply issues cannot be fixed by the prescriber.   NOTE: The prescriber is not responsible for supplying the medication, or solving supply issues. Work with your pharmacist to solve it. The patient is responsible for the decision to take or continue taking the medication and for identifying and securing a legal supply source. By  law, supplying the medication is the job and responsibility of the pharmacy. The prescriber is responsible for the evaluation, monitoring, and prescribing of these medications.   Prescribers will NOT: Re-issue prescriptions that have been partially filled. Re-issue prescriptions already sent to a pharmacy.  Re-send prescriptions to a different pharmacy because yours did not have your medication. Ask pharmacist to order more medicine or transfer the prescription to another pharmacy. (Read below.)  New 2023 regulation: "April 14, 2022 Revised Regulation Allows DEA-Registered Pharmacies to Transfer Electronic Prescriptions at a Patient's Request DEA Headquarters Division - Public Information Office Patients now have the ability to request their electronic prescription be transferred to another pharmacy without having to go back to their practitioner to initiate the request. This revised regulation went into effect on Monday, April 10, 2022.     At a patient's request, a DEA-registered retail pharmacy can now transfer an electronic prescription for a controlled substance (schedules II-V) to another DEA-registered retail pharmacy. Prior to this change, patients would have to go through their practitioner to cancel their prescription and have it re-issued to a different pharmacy. The process was taxing and time consuming for both patients and practitioners.    The Drug Enforcement Administration (DEA) published its intent to revise the process for transferring electronic prescriptions on July 02, 2020.  The final rule was published in the federal register on March 09, 2022 and went into effect 30 days later.  Under the final rule, a prescription can only be transferred once between pharmacies, and only if allowed under existing state or other applicable law. The prescription must remain in its electronic form; may not be altered in any way; and the transfer must be communicated directly between  two licensed pharmacists. It's important to note, any authorized refills transfer with the original prescription, which means the entire prescription will be filled at the same pharmacy".  Reference: https://www.dea.gov/stories/2023/2023-04/2022-09-01/revised-regulation-allows-dea-registered-pharmacies-transfer (DEA website announcement)  https://www.govinfo.gov/content/pkg/FR-2022-03-09/pdf/2023-15847.pdf (Federal Register  Department of Justice)   Federal Register / Vol. 88, No. 143 / Thursday, March 09, 2022 / Rules and Regulations DEPARTMENT OF JUSTICE  Drug Enforcement   Administration  21 CFR Part 1306  [Docket No. [id number]]  RIN 1117-AB64 Transfer of Electronic Prescriptions for Schedules II-V Controlled Substances Between Pharmacies for Initial Filling  ____________________________________________________________________________________________     _______________________________________________________________________  Medication Rules  Purpose: To inform patients, and their family members, of our medication rules and regulations.  Applies to: All patients receiving prescriptions from our practice (written or electronic).  Pharmacy of record: This is the pharmacy where your electronic prescriptions will be sent. Make sure we have the correct one.  Electronic prescriptions: In compliance with the [place] Strengthen Opioid Misuse Prevention (STOP) Act of 2017 (Session Law 2017-74/H243), effective August 14, 2018, all controlled substances must be electronically prescribed. Written prescriptions, faxing, or calling prescriptions to a pharmacy will no longer be done.  Prescription refills: These will be provided only during in-person appointments. No medications will be renewed without a "face-to-face" evaluation with your provider. Applies to all prescriptions.  NOTE: The following applies primarily to controlled substances (Opioid* Pain Medications).   Type of encounter  (visit): For patients receiving controlled substances, face-to-face visits are required. (Not an option and not up to the patient.)  Patient's responsibilities: Pain Pills: Bring all pain pills to every appointment (except for procedure appointments). Pill Bottles: Bring pills in original pharmacy bottle. Bring bottle, even if empty. Always bring the bottle of the most recent fill.  Medication refills: You are responsible for knowing and keeping track of what medications you are taking and when is it that you will need a refill. The day before your appointment: write a list of all prescriptions that need to be refilled. The day of the appointment: give the list to the admitting nurse. Prescriptions will be written only during appointments. No prescriptions will be written on procedure days. If you forget a medication: it will not be "Called in", "Faxed", or "electronically sent". You will need to get another appointment to get these prescribed. No early refills. Do not call asking to have your prescription filled early. Partial  or short prescriptions: Occasionally your pharmacy may not have enough pills to fill your prescription.  NEVER ACCEPT a partial fill or a prescription that is short of the total amount of pills that you were prescribed.  With controlled substances the law allows 72 hours for the pharmacy to complete the prescription.  If the prescription is not completed within 72 hours, the pharmacist will require a new prescription to be written. This means that you will be short on your medicine and we WILL NOT send another prescription to complete your original prescription.  Instead, request the pharmacy to send a carrier to a nearby branch to get enough medication to provide you with your full prescription. Prescription Accuracy: You are responsible for carefully inspecting your prescriptions before leaving our office. Have the discharge nurse carefully go over each prescription with you,  before taking them home. Make sure that your name is accurately spelled, that your address is correct. Check the name and dose of your medication to make sure it is accurate. Check the number of pills, and the written instructions to make sure they are clear and accurate. Make sure that you are given enough medication to last until your next medication refill appointment. Taking Medication: Take medication as prescribed. When it comes to controlled substances, taking less pills or less frequently than prescribed is permitted and encouraged. Never take more pills than instructed. Never take the medication more frequently than prescribed.  Inform other Doctors: Always inform, all of   your healthcare providers, of all the medications you take. Pain Medication from other Providers: You are not allowed to accept any additional pain medication from any other Doctor or Healthcare provider. There are two exceptions to this rule. (see below) In the event that you require additional pain medication, you are responsible for notifying us, as stated below. Cough Medicine: Often these contain an opioid, such as codeine or hydrocodone. Never accept or take cough medicine containing these opioids if you are already taking an opioid* medication. The combination may cause respiratory failure and death. Medication Agreement: You are responsible for carefully reading and following our Medication Agreement. This must be signed before receiving any prescriptions from our practice. Safely store a copy of your signed Agreement. Violations to the Agreement will result in no further prescriptions. (Additional copies of our Medication Agreement are available upon request.) Laws, Rules, & Regulations: All patients are expected to follow all Federal and State Laws, Statutes, Rules, & Regulations. Ignorance of the Laws does not constitute a valid excuse.  Illegal drugs and Controlled Substances: The use of illegal substances (including,  but not limited to marijuana and its derivatives) and/or the illegal use of any controlled substances is strictly prohibited. Violation of this rule may result in the immediate and permanent discontinuation of any and all prescriptions being written by our practice. The use of any illegal substances is prohibited. Adopted CDC guidelines & recommendations: Target dosing levels will be at or below 60 MME/day. Use of benzodiazepines** is not recommended.  Exceptions: There are only two exceptions to the rule of not receiving pain medications from other Healthcare Providers. Exception #1 (Emergencies): In the event of an emergency (i.e.: accident requiring emergency care), you are allowed to receive additional pain medication. However, you are responsible for: As soon as you are able, call our office [phone], at any time of the day or night, and leave a message stating your name, the date and nature of the emergency, and the name and dose of the medication prescribed. In the event that your call is answered by a member of our staff, make sure to document and save the date, time, and the name of the person that took your information.  Exception #2 (Planned Surgery): In the event that you are scheduled by another doctor or dentist to have any type of surgery or procedure, you are allowed (for a period no longer than 30 days), to receive additional pain medication, for the acute post-op pain. However, in this case, you are responsible for picking up a copy of our "Post-op Pain Management for Surgeons" handout, and giving it to your surgeon or dentist. This document is available at our office, and does not require an appointment to obtain it. Simply go to our office during business hours (Monday-Thursday from 8:00 AM to 4:00 PM) (Friday 8:00 AM to 12:00 Noon) or if you have a scheduled appointment with us, prior to your surgery, and ask for it by name. In addition, you are responsible for: calling our office  [phone], at any time of the day or night, and leaving a message stating your name, name of your surgeon, type of surgery, and date of procedure or surgery. Failure to comply with your responsibilities may result in termination of therapy involving the controlled substances. Medication Agreement Violation. Following the above rules, including your responsibilities will help you in avoiding a Medication Agreement Violation ("Breaking your Pain Medication Contract").  Consequences:  Not following the above rules may result   in permanent discontinuation of medication prescription therapy.  *Opioid medications include: morphine, codeine, oxycodone, oxymorphone, hydrocodone, hydromorphone, meperidine, tramadol, tapentadol, buprenorphine, fentanyl, methadone. **Benzodiazepine medications include: diazepam (Valium), alprazolam (Xanax), clonazepam (Klonopine), lorazepam (Ativan), clorazepate (Tranxene), chlordiazepoxide (Librium), estazolam (Prosom), oxazepam (Serax), temazepam (Restoril), triazolam (Halcion) (Last updated: 06/06/2022) ______________________________________________________________________    ______________________________________________________________________  Medication Recommendations and Reminders  Applies to: All patients receiving prescriptions (written and/or electronic).  Medication Rules & Regulations: You are responsible for reading, knowing, and following our "Medication Rules" document. These exist for your safety and that of others. They are not flexible and neither are we. Dismissing or ignoring them is an act of "non-compliance" that may result in complete and irreversible termination of such medication therapy. For safety reasons, "non-compliance" will not be tolerated. As with the [place] fundamental legal principle of "ignorance of the law is no defense", we will accept no excuses for not having read and knowing the content of documents provided to you by our  practice.  Pharmacy of record:  Definition: This is the pharmacy where your electronic prescriptions will be sent.  We do not endorse any particular pharmacy. It is up to you and your insurance to decide what pharmacy to use.  We do not restrict you in your choice of pharmacy. However, once we write for your prescriptions, we will NOT be re-sending more prescriptions to fix restricted supply problems created by your pharmacy, or your insurance.  The pharmacy listed in the electronic medical record should be the one where you want electronic prescriptions to be sent. If you choose to change pharmacy, simply notify our nursing staff. Changes will be made only during your regular appointments and not over the phone.  Recommendations: Keep all of your pain medications in a safe place, under lock and key, even if you live alone. We will NOT replace lost, stolen, or damaged medication. We do not accept "Police Reports" as proof of medications having been stolen. After you fill your prescription, take 1 week's worth of pills and put them away in a safe place. You should keep a separate, properly labeled bottle for this purpose. The remainder should be kept in the original bottle. Use this as your primary supply, until it runs out. Once it's gone, then you know that you have 1 week's worth of medicine, and it is time to come in for a prescription refill. If you do this correctly, it is unlikely that you will ever run out of medicine. To make sure that the above recommendation works, it is very important that you make sure your medication refill appointments are scheduled at least 1 week before you run out of medicine. To do this in an effective manner, make sure that you do not leave the office without scheduling your next medication management appointment. Always ask the nursing staff to show you in your prescription , when your medication will be running out. Then arrange for the receptionist to get you a  return appointment, at least 7 days before you run out of medicine. Do not wait until you have 1 or 2 pills left, to come in. This is very poor planning and does not take into consideration that we may need to cancel appointments due to bad weather, sickness, or emergencies affecting our staff. DO NOT ACCEPT A "Partial Fill": If for any reason your pharmacy does not have enough pills/tablets to completely fill or refill your prescription, do not allow for a "partial fill". The law allows the pharmacy to complete   that prescription within 72 hours, without requiring a new prescription. If they do not fill the rest of your prescription within those 72 hours, you will need a separate prescription to fill the remaining amount, which we will NOT provide. If the reason for the partial fill is your insurance, you will need to talk to the pharmacist about payment alternatives for the remaining tablets, but again, DO NOT ACCEPT A PARTIAL FILL, unless you can trust your pharmacist to obtain the remainder of the pills within 72 hours.  Prescription refills and/or changes in medication(s):  Prescription refills, and/or changes in dose or medication, will be conducted only during scheduled medication management appointments. (Applies to both, written and electronic prescriptions.) No refills on procedure days. No medication will be changed or started on procedure days. No changes, adjustments, and/or refills will be conducted on a procedure day. Doing so will interfere with the diagnostic portion of the procedure. No phone refills. No medications will be "called into the pharmacy". No Fax refills. No weekend refills. No Holliday refills. No after hours refills.  Remember:  Business hours are:  Monday to Thursday 8:00 AM to 4:00 PM Provider's Schedule: Swan Fairfax, MD - Appointments are:  Medication management: Monday and Wednesday 8:00 AM to 4:00 PM Procedure day: Tuesday and Thursday 7:30 AM to 4:00  PM Bilal Lateef, MD - Appointments are:  Medication management: Tuesday and Thursday 8:00 AM to 4:00 PM Procedure day: Monday and Wednesday 7:30 AM to 4:00 PM (Last update: 06/06/2022) ______________________________________________________________________    ____________________________________________________________________________________________  Drug Holidays  What is a "Drug Holiday"? Drug Holiday: is the name given to the process of slowly tapering down and temporarily stopping the pain medication for the purpose of decreasing or eliminating tolerance to the drug.  Benefits Improved effectiveness Decreased required effective dose Improved pain control End dependence on high dose therapy Decrease cost of therapy Uncovering "opioid-induced hyperalgesia". (OIH)  What is "opioid hyperalgesia"? It is a paradoxical increase in pain caused by exposure to opioids. Stopping the opioid pain medication, contrary to the expected, it actually decreases or completely eliminates the pain. Ref.: "A comprehensive review of opioid-induced hyperalgesia". Marion Lee, et.al. Pain Physician. 2011 Mar-Apr;14(2):145-61.  What is tolerance? Tolerance: the progressive loss of effectiveness of a pain medicine due to repetitive use. A common problem of opioid pain medications.  How long should a "Drug Holiday" last? Effectiveness depends on the patient staying off all opioid pain medicines for a minimum of 14 consecutive days. (2 weeks)  How about just taking less of the medicine? Does not work. Will not accomplish goal of eliminating the excess receptors.  How about switching to a different pain medicine? (AKA. "Opioid rotation") Does not work. Creates the illusion of effectiveness by taking advantage of inaccurate equivalent dose calculations between different opioids. -This "technique" was promoted by studies funded by pharmaceutical companies, such as [employer], creators of  "OxyContin".  Can I stop the medicine "cold turkey"? Depends. You should always coordinate with your Pain Specialist to make the transition as smoothly as possible. Avoid stopping the medicine abruptly without consulting. We recommend a "slow taper".  What is a slow taper? Taper: refers to the gradual decrease in dose.   How do I stop/taper the dose? Slowly. Decrease the daily amount of pills that you take by one (1) pill every seven (7) days. This is called a "slow downward taper". Example: if you normally take four (4) pills per day, drop it to three (3) pills per day   for seven (7) days, then to two (2) pills per day for seven (7) days, then to one (1) per day for seven (7) days, and then stop the medicine. The 14 day "Drug Holiday" starts on the first day without medicine.   Will I experience withdrawals? Unlikely with a slow taper.  What triggers withdrawals? Withdrawals are triggered by the sudden/abrupt stop of high dose opioids. Withdrawals can be avoided by slowly decreasing the dose over a prolonged period of time.  What are withdrawals? Symptoms associated with sudden/abrupt reduction/stopping of high-dose, long-term use of pain medication. Withdrawal are seldom seen on low dose therapy, or patients rarely taking opioid medication.  Early Withdrawal Symptoms may include: Agitation Anxiety Muscle aches Increased tearing Insomnia Runny nose Sweating Yawning  Late symptoms may include: Abdominal cramping Diarrhea Dilated pupils Goose bumps Nausea Vomiting  (Last update: 07/23/2022) ____________________________________________________________________________________________    ____________________________________________________________________________________________  WARNING: CBD (cannabidiol) & Delta (Delta-8 tetrahydrocannabinol) products.   Applicable to:  All individuals currently taking or considering taking CBD (cannabidiol) and, more important, all  patients taking opioid analgesic controlled substances (pain medication). (Example: oxycodone; oxymorphone; hydrocodone; hydromorphone; morphine; methadone; tramadol; tapentadol; fentanyl; buprenorphine; butorphanol; dextromethorphan; meperidine; codeine; etc.)  Introduction:  Recently there has been a drive towards the use of "natural" products for the treatment of different conditions, including pain anxiety and sleep disorders. Marijuana and hemp are two varieties of the cannabis genus plants. Marijuana and its derivatives are illegal, while hemp and its derivatives are not. Cannabidiol (CBD) and tetrahydrocannabinol (THC), are two natural compounds found in plants of the Cannabis genus. They can both be extracted from hemp or marijuana. Both compounds interact with your body's endocannabinoid system in very different ways. CBD is associated with pain relief (analgesia) while THC is associated with the psychoactive effects ("the high") obtained from the use of marijuana products. There are two main types of THC: Delta-9, which comes from the marijuana plant and it is illegal, and Delta-8, which comes from the hemp plant, and it is legal. (Both, Delta-9-THC and Delta-8-THC are psychoactive and give you "the high".)   Legality:  Marijuana and its derivatives: illegal Hemp and its derivatives: Legal (State dependent) UPDATE: (09/30/2021) The Drug Enforcement Agency (DEA) issued a letter stating that "delta" cannabinoids, including Delta-8-THCO and Delta-9-THCO, synthetically derived from hemp do not qualify as hemp and will be viewed as Schedule I drugs. (Schedule I drugs, substances, or chemicals are defined as drugs with no currently accepted medical use and a high potential for abuse. Some examples of Schedule I drugs are: heroin, lysergic acid diethylamide (LSD), marijuana (cannabis), 3,4-methylenedioxymethamphetamine (ecstasy), methaqualone, and peyote.) (https://www.dea.gov)  Legal status of CBD in  [place]:  "Conditionally Legal"  Reference: "FDA Regulation of Cannabis and Cannabis-Derived Products, Including Cannabidiol (CBD)" - https://www.fda.gov/news-events/public-health-focus/fda-regulation-cannabis-and-cannabis-derived-products-including-cannabidiol-cbd  Warning:  CBD is not FDA approved and has not undergo the same manufacturing controls as prescription drugs.  This means that the purity and safety of available CBD may be questionable. Most of the time, despite manufacturer's claims, it is contaminated with THC (delta-9-tetrahydrocannabinol - the chemical in marijuana responsible for the "HIGH").  When this is the case, the THC contaminant will trigger a positive urine drug screen (UDS) test for Marijuana (carboxy-THC).   The FDA recently put out a warning about 5 things that everyone should be aware of regarding Delta-8 THC: Delta-8 THC products have not been evaluated or approved by the FDA for safe use and may be marketed in ways that put the public health at   risk. The FDA has received adverse event reports involving delta-8 THC-containing products. Delta-8 THC has psychoactive and intoxicating effects. Delta-8 THC manufacturing often involve use of potentially harmful chemicals to create the concentrations of delta-8 THC claimed in the marketplace. The final delta-8 THC product may have potentially harmful by-products (contaminants) due to the chemicals used in the process. Manufacturing of delta-8 THC products may occur in uncontrolled or unsanitary settings, which may lead to the presence of unsafe contaminants or other potentially harmful substances. Delta-8 THC products should be kept out of the reach of children and pets.  NOTE: Because a positive UDS for any illicit substance is a violation of our medication agreement, your opioid analgesics (pain medicine) may be permanently discontinued.  MORE ABOUT CBD  General Information: CBD was discovered in 1940 and it is a derivative of  the cannabis sativa genus plants (Marijuana and Hemp). It is one of the 113 identified substances found in Marijuana. It accounts for up to 40% of the plant's extract. As of 2018, preliminary clinical studies on CBD included research for the treatment of anxiety, movement disorders, and pain. CBD is available and consumed in multiple forms, including inhalation of smoke or vapor, as an aerosol spray, and by mouth. It may be supplied as an oil containing CBD, capsules, dried cannabis, or as a liquid solution. CBD is thought not to be as psychoactive as THC (delta-9-tetrahydrocannabinol - the chemical in marijuana responsible for the "HIGH"). Studies suggest that CBD may interact with different biological target receptors in the body, including cannabinoid and other neurotransmitter receptors. As of 2018 the mechanism of action for its biological effects has not been determined.  Side-effects  Adverse reactions: Dry mouth, diarrhea, decreased appetite, fatigue, drowsiness, malaise, weakness, sleep disturbances, and others.  Drug interactions:  CBD may interact with medications such as blood-thinners. CBD causes drowsiness on its own and it will increase drowsiness caused by other medications, including antihistamines (such as Benadryl), benzodiazepines (Xanax, Ativan, Valium), antipsychotics, antidepressants, opioids, alcohol and supplements such as kava, melatonin and St. John's Wort.  Other drug interactions: Brivaracetam (Briviact); Caffeine; Carbamazepine (Tegretol); Citalopram (Celexa); Clobazam (Onfi); Eslicarbazepine (Aptiom); Everolimus (Zostress); Lithium; Methadone (Dolophine); Rufinamide (Banzel); Sedative medications (CNS depressants); Sirolimus (Rapamune); Stiripentol (Diacomit); Tacrolimus (Prograf); Tamoxifen ; Soltamox); Topiramate (Topamax); Valproate; Warfarin (Coumadin); Zonisamide. (Last update:  07/24/2022) ____________________________________________________________________________________________   ____________________________________________________________________________________________  Naloxone Nasal Spray  Why am I receiving this medication? [place] STOP ACT requires that all patients taking high dose opioids or at risk of opioids respiratory depression, be prescribed an opioid reversal agent, such as Naloxone (AKA: Narcan).  What is this medication? NALOXONE (nal OX one) treats opioid overdose, which causes slow or shallow breathing, severe drowsiness, or trouble staying awake. Call emergency services after using this medication. You may need additional treatment. Naloxone works by reversing the effects of opioids. It belongs to a group of medications called opioid blockers.  COMMON BRAND NAME(S): Kloxxado, Narcan  What should I tell my care team before I take this medication? They need to know if you have any of these conditions: Heart disease Substance use disorder An unusual or allergic reaction to naloxone, other medications, foods, dyes, or preservatives Pregnant or trying to get pregnant Breast-feeding  When to use this medication? This medication is to be used for the treatment of respiratory depression (less than 8 breaths per minute) secondary to opioid overdose.   How to use this medication? This medication is for use in the nose. Lay the person on their   back. Support their neck with your hand and allow the head to tilt back before giving the medication. The nasal spray should be given into 1 nostril. After giving the medication, move the person onto their side. Do not remove or test the nasal spray until ready to use. Get emergency medical help right away after giving the first dose of this medication, even if the person wakes up. You should be familiar with how to recognize the signs and symptoms of a narcotic overdose. If more doses are needed, give  the additional dose in the other nostril. Talk to your care team about the use of this medication in children. While this medication may be prescribed for children as young as newborns for selected conditions, precautions do apply.  Naloxone Overdosage: If you think you have taken too much of this medicine contact a poison control center or emergency room at once.  NOTE: This medicine is only for you. Do not share this medicine with others.  What if I miss a dose? This does not apply.  What may interact with this medication? This is only used during an emergency. No interactions are expected during emergency use. This list may not describe all possible interactions. Give your health care provider a list of all the medicines, herbs, non-prescription drugs, or dietary supplements you use. Also tell them if you smoke, drink alcohol, or use illegal drugs. Some items may interact with your medicine.  What should I watch for while using this medication? Keep this medication ready for use in the case of an opioid overdose. Make sure that you have the phone number of your care team and local hospital ready. You may need to have additional doses of this medication. Each nasal spray contains a single dose. Some emergencies may require additional doses. After use, bring the treated person to the nearest hospital or call 911. Make sure the treating care team knows that the person has received a dose of this medication. You will receive additional instructions on what to do during and after use of this medication before an emergency occurs.  What side effects may I notice from receiving this medication? Side effects that you should report to your care team as soon as possible: Allergic reactions--skin rash, itching, hives, swelling of the face, lips, tongue, or throat Side effects that usually do not require medical attention (report these to your care team if they continue or are  bothersome): Constipation Dryness or irritation inside the nose Headache Increase in blood pressure Muscle spasms Stuffy nose Toothache This list may not describe all possible side effects. Call your doctor for medical advice about side effects. You may report side effects to FDA at 1-800-FDA-1088.  Where should I keep my medication? Because this is an emergency medication, you should keep it with you at all times.  Keep out of the reach of children and pets. Store between 20 and 25 degrees C (68 and 77 degrees F). Do not freeze. Throw away any unused medication after the expiration date. Keep in original box until ready to use.  NOTE: This sheet is a summary. It may not cover all possible information. If you have questions about this medicine, talk to your doctor, pharmacist, or health care provider.   2023 Elsevier/Gold Standard (2021-04-08 00:00:00)  ____________________________________________________________________________________________   

## 2022-08-24 ENCOUNTER — Ambulatory Visit (INDEPENDENT_AMBULATORY_CARE_PROVIDER_SITE_OTHER): Payer: Medicare HMO | Admitting: Gastroenterology

## 2022-08-24 DIAGNOSIS — B1910 Unspecified viral hepatitis B without hepatic coma: Secondary | ICD-10-CM | POA: Diagnosis not present

## 2022-08-24 DIAGNOSIS — K760 Fatty (change of) liver, not elsewhere classified: Secondary | ICD-10-CM | POA: Diagnosis not present

## 2022-08-24 DIAGNOSIS — Z91199 Patient's noncompliance with other medical treatment and regimen due to unspecified reason: Secondary | ICD-10-CM

## 2022-08-24 DIAGNOSIS — Z23 Encounter for immunization: Secondary | ICD-10-CM

## 2022-08-24 NOTE — Progress Notes (Signed)
No show

## 2022-08-31 ENCOUNTER — Telehealth: Payer: Self-pay | Admitting: Family Medicine

## 2022-08-31 DIAGNOSIS — G8929 Other chronic pain: Secondary | ICD-10-CM

## 2022-08-31 DIAGNOSIS — M62838 Other muscle spasm: Secondary | ICD-10-CM

## 2022-08-31 MED ORDER — TIZANIDINE HCL 4 MG PO TABS: 4.0000 mg | ORAL_TABLET | Freq: Three times a day (TID) | ORAL | 2 refills | Status: DC | PRN

## 2022-08-31 NOTE — Telephone Encounter (Signed)
See prev message that says:  Pt states [facility] wasn't the original provider prescribing meds, Dr. Milinda Pointer was. Pt stated Dr. Dossie Arbour stated he sent [facility] a letter asking her to start prescribing some of the pt's meds.     Med not on med list anymore it looks like Dr. Adalberto Cole office d/c med on 03/13/22

## 2022-08-31 NOTE — Addendum Note (Signed)
Addended by: Loura Pardon A on: 08/31/2022 02:54 PM   Modules accepted: Orders

## 2022-08-31 NOTE — Telephone Encounter (Signed)
Prescription Request  08/31/2022  Is this a "Controlled Substance" medicine? No  LOV: Visit date not found  What is the name of the medication or equipment? tizanidine HCL '4mg'$   Have you contacted your pharmacy to request a refill? No   Which pharmacy would you like this sent to?  [id number] [address] [facility] [id number]  [id number]: [phone]: [phone]   Patient notified that their request is being sent to the clinical staff for review and that they should receive a response within 2 business days.   Please advise at Mobile [phone](mobile)   Pt states [facility] wasn't the original provider prescribing meds, Dr. FMilinda Pointerwas. Pt stated Dr. NDossie Arbourstated he sent [facility] a letter asking her to start prescribing some of the pt's meds.

## 2022-09-06 ENCOUNTER — Other Ambulatory Visit: Payer: Self-pay | Admitting: Family Medicine

## 2022-09-06 DIAGNOSIS — M792 Neuralgia and neuritis, unspecified: Secondary | ICD-10-CM

## 2022-09-07 NOTE — Telephone Encounter (Signed)
Last filled on 10/28/21 #150 caps with 2 refills, last OV was 02/21/22

## 2022-09-27 ENCOUNTER — Telehealth: Payer: Self-pay

## 2022-09-27 NOTE — Telephone Encounter (Signed)
-----   Message from Jonathon Bellows, MD sent at 09/25/2022 12:16 PM EST ----- Regarding: RE: order liver biopsy Yes if she is willing     ----- Message ----- From: Wayna Chalet, CMA Sent: 09/22/2022   9:31 AM EST To: Jonathon Bellows, MD Subject: FW: order liver biopsy                         Dr. Vicente Males, I had sent myself this message. Do you want her to have this liver biopsy scheduled? Please advise.  ----- Message ----- From: Wayna Chalet, CMA Sent: 09/17/2022  12:00 AM EST To: Wayna Chalet, CMA Subject: FW: order liver biopsy                          ----- Message ----- From: Wayna Chalet, CMA Sent: 07/24/2022   3:50 PM EST To: Wayna Chalet, CMA Subject: order liver biopsy                             Per DR. Vicente Males, patient is dur for a liver biopsy in February 2024. Read last OV from 07/24/2022.

## 2022-09-27 NOTE — Telephone Encounter (Signed)
Called patient to ask if she was interested in scheduling a liver biopsy as discussed on her visit with DR. Vicente Males on 12/112023. Patient stated that she was not interested at this time. But that if she changed her mind, then she would call us and let us know.

## 2022-09-28 ENCOUNTER — Other Ambulatory Visit: Payer: Self-pay | Admitting: Internal Medicine

## 2022-09-28 ENCOUNTER — Other Ambulatory Visit: Payer: Self-pay | Admitting: Family Medicine

## 2022-09-28 DIAGNOSIS — M159 Polyosteoarthritis, unspecified: Secondary | ICD-10-CM

## 2022-09-28 NOTE — Telephone Encounter (Signed)
Last filled on 04/03/22 #90 tabs with 1 refill, last f/u was on 02/21/22

## 2022-10-28 ENCOUNTER — Other Ambulatory Visit: Payer: Self-pay | Admitting: Family Medicine

## 2022-10-28 DIAGNOSIS — I10 Essential (primary) hypertension: Secondary | ICD-10-CM

## 2022-10-30 ENCOUNTER — Other Ambulatory Visit: Payer: Self-pay | Admitting: Internal Medicine

## 2022-10-30 ENCOUNTER — Other Ambulatory Visit: Payer: Self-pay | Admitting: Family Medicine

## 2022-10-30 DIAGNOSIS — M7918 Myalgia, other site: Secondary | ICD-10-CM

## 2022-10-30 DIAGNOSIS — M62838 Other muscle spasm: Secondary | ICD-10-CM

## 2022-10-31 ENCOUNTER — Encounter: Payer: Self-pay | Admitting: Podiatry

## 2022-11-06 ENCOUNTER — Ambulatory Visit: Payer: Medicare HMO | Admitting: Podiatry

## 2022-11-20 ENCOUNTER — Ambulatory Visit: Payer: Medicare HMO | Admitting: Podiatry

## 2022-11-25 ENCOUNTER — Other Ambulatory Visit: Payer: Self-pay | Admitting: Family Medicine

## 2022-11-25 DIAGNOSIS — M62838 Other muscle spasm: Secondary | ICD-10-CM

## 2022-11-25 DIAGNOSIS — G8929 Other chronic pain: Secondary | ICD-10-CM

## 2022-11-27 NOTE — Telephone Encounter (Signed)
Last filled on 08/21/22 #90 tabs/ 2 refills, f/u scheduled 12/01/22 (this is asking for 90 day Rx)

## 2022-11-28 NOTE — Patient Instructions (Signed)
____________________________________________________________________________________________  Opioid Pain Medication Update  To: All patients taking opioid pain medications. (I.e.: hydrocodone, hydromorphone, oxycodone, oxymorphone, morphine, codeine, methadone, tapentadol, tramadol, buprenorphine, fentanyl, etc.)  Re: Updated review of side effects and adverse reactions of opioid analgesics, as well as new information about long term effects of this class of medications.  Direct risks of long-term opioid therapy are not limited to opioid addiction and overdose. Potential medical risks include serious fractures, breathing problems during sleep, hyperalgesia, immunosuppression, chronic constipation, bowel obstruction, myocardial infarction, and tooth decay secondary to xerostomia.  Unpredictable adverse effects that can occur even if you take your medication correctly: Cognitive impairment, respiratory depression, and death. Most people think that if they take their medication "correctly", and "as instructed", that they will be safe. Nothing could be farther from the truth. In reality, a significant amount of recorded deaths associated with the use of opioids has occurred in individuals that had taken the medication for a long time, and were taking their medication correctly. The following are examples of how this can happen: Patient taking his/her medication for a long time, as instructed, without any side effects, is given a certain antibiotic or another unrelated medication, which in turn triggers a "Drug-to-drug interaction" leading to disorientation, cognitive impairment, impaired reflexes, respiratory depression or an untoward event leading to serious bodily harm or injury, including death.  Patient taking his/her medication for a long time, as instructed, without any side effects, develops an acute impairment of liver and/or kidney function. This will lead to a rapid inability of the body to  breakdown and eliminate their pain medication, which will result in effects similar to an "overdose", but with the same medicine and dose that they had always taken. This again may lead to disorientation, cognitive impairment, impaired reflexes, respiratory depression or an untoward event leading to serious bodily harm or injury, including death.  A similar problem will occur with patients as they grow older and their liver and kidney function begins to decrease as part of the aging process.  Background information: Historically, the original case for using long-term opioid therapy to treat chronic noncancer pain was based on safety assumptions that subsequent experience has called into question. In 1996, the American Pain Society and the American Academy of Pain Medicine issued a consensus statement supporting long-term opioid therapy. This statement acknowledged the dangers of opioid prescribing but concluded that the risk for addiction was low; respiratory depression induced by opioids was short-lived, occurred mainly in opioid-naive patients, and was antagonized by pain; tolerance was not a common problem; and efforts to control diversion should not constrain opioid prescribing. This has now proven to be wrong. Experience regarding the risks for opioid addiction, misuse, and overdose in community practice has failed to support these assumptions.  According to the Centers for Disease Control and Prevention, fatal overdoses involving opioid analgesics have increased sharply over the past decade. Currently, more than 96,700 people die from drug overdoses every year. Opioids are a factor in 7 out of every 10 overdose deaths. Deaths from drug overdose have surpassed motor vehicle accidents as the leading cause of death for individuals between the ages of 35 and 54.  Clinical data suggest that neuroendocrine dysfunction may be very common in both men and women, potentially causing hypogonadism, erectile  dysfunction, infertility, decreased libido, osteoporosis, and depression. Recent studies linked higher opioid dose to increased opioid-related mortality. Controlled observational studies reported that long-term opioid therapy may be associated with increased risk for cardiovascular events. Subsequent meta-analysis concluded   that the safety of long-term opioid therapy in elderly patients has not been proven.   Side Effects and adverse reactions: Common side effects: Drowsiness (sedation). Dizziness. Nausea and vomiting. Constipation. Physical dependence -- Dependence often manifests with withdrawal symptoms when opioids are discontinued or decreased. Tolerance -- As you take repeated doses of opioids, you require increased medication to experience the same effect of pain relief. Respiratory depression -- This can occur in healthy people, especially with higher doses. However, people with COPD, asthma or other lung conditions may be even more susceptible to fatal respiratory impairment.  Uncommon side effects: An increased sensitivity to feeling pain and extreme response to pain (hyperalgesia). Chronic use of opioids can lead to this. Delayed gastric emptying (the process by which the contents of your stomach are moved into your small intestine). Muscle rigidity. Immune system and hormonal dysfunction. Quick, involuntary muscle jerks (myoclonus). Arrhythmia. Itchy skin (pruritus). Dry mouth (xerostomia).  Long-term side effects: Chronic constipation. Sleep-disordered breathing (SDB). Increased risk of bone fractures. Hypothalamic-pituitary-adrenal dysregulation. Increased risk of overdose.  RISKS: Fractures and Falls:  Opioids increase the risk and incidence of falls. This is of particular importance in elderly patients.  Endocrine System:  Long-term administration is associated with endocrine abnormalities (endocrinopathies). (Also known as Opioid-induced Endocrinopathy) Influences  on both the hypothalamic-pituitary-adrenal axis?and the hypothalamic-pituitary-gonadal axis have been demonstrated with consequent hypogonadism and adrenal insufficiency in both sexes. Hypogonadism and decreased levels of dehydroepiandrosterone sulfate have been reported in men and women. Endocrine effects include: Amenorrhoea in women (abnormal absence of menstruation) Reduced libido in both sexes Decreased sexual function Erectile dysfunction in men Hypogonadisms (decreased testicular function with shrinkage of testicles) Infertility Depression and fatigue Loss of muscle mass Anxiety Depression Immune suppression Hyperalgesia Weight gain Anemia Osteoporosis Patients (particularly women of childbearing age) should avoid opioids. There is insufficient evidence to recommend routine monitoring of asymptomatic patients taking opioids in the long-term for hormonal deficiencies.  Immune System: Human studies have demonstrated that opioids have an immunomodulating effect. These effects are mediated via opioid receptors both on immune effector cells and in the central nervous system. Opioids have been demonstrated to have adverse effects on antimicrobial response and anti-tumour surveillance. Buprenorphine has been demonstrated to have no impact on immune function.  Opioid Induced Hyperalgesia: Human studies have demonstrated that prolonged use of opioids can lead to a state of abnormal pain sensitivity, sometimes called opioid induced hyperalgesia (OIH). Opioid induced hyperalgesia is not usually seen in the absence of tolerance to opioid analgesia. Clinically, hyperalgesia may be diagnosed if the patient on long-term opioid therapy presents with increased pain. This might be qualitatively and anatomically distinct from pain related to disease progression or to breakthrough pain resulting from development of opioid tolerance. Pain associated with hyperalgesia tends to be more diffuse than the  pre-existing pain and less defined in quality. Management of opioid induced hyperalgesia requires opioid dose reduction.  Cancer: Chronic opioid therapy has been associated with an increased risk of cancer among noncancer patients with chronic pain. This association was more evident in chronic strong opioid users. Chronic opioid consumption causes significant pathological changes in the small intestine and colon. Epidemiological studies have found that there is a link between opium dependence and initiation of gastrointestinal cancers. Cancer is the second leading cause of death after cardiovascular disease. Chronic use of opioids can cause multiple conditions such as GERD, immunosuppression and renal damage as well as carcinogenic effects, which are associated with the incidence of cancers.   Mortality: Long-term opioid use   has been associated with increased mortality among patients with chronic non-cancer pain (CNCP).  Prescription of long-acting opioids for chronic noncancer pain was associated with a significantly increased risk of all-cause mortality, including deaths from causes other than overdose.  Reference: Von Korff M, Kolodny A, Deyo RA, Chou R. Long-term opioid therapy reconsidered. Ann Intern Med. 2011 Sep 6;155(5):325-8. doi: 10.7326/[id number]. PMID: 21893626; PMCID: PMC3280085. Bedson J, Chen Y, Ashworth J, Hayward RA, Dunn KM, Jordan KP. Risk of adverse events in patients prescribed long-term opioids: A cohort study in the [place] Clinical Practice Research Datalink. Eur J Pain. 2019 May;23(5):908-922. doi: 10.1002/ejp.1357. Epub 2019 Jan 31. PMID: 30620116. Colameco S, Coren JS, Ciervo CA. Continuous opioid treatment for chronic noncancer pain: a time for moderation in prescribing. Postgrad Med. 2009 Jul;121(4):61-6. doi: 10.3810/pgm.2009.07.2032. PMID: 19641271. Chou R, Turner JA, Devine EB, Hansen RN, Sullivan SD, Blazina I, Dana T, Bougatsos C, Deyo RA. The  effectiveness and risks of long-term opioid therapy for chronic pain: a systematic review for a National Institutes of Health Pathways to Prevention Workshop. Ann Intern Med. 2015 Feb 17;162(4):276-86. doi: 10.7326/M14-2559. PMID: 25581257. Warner M, Chen LH, Makuc DM. NCHS Data Brief No. 22. [place]: Centers for Disease Control and Prevention; 2009. Sep, Increase in Fatal Poisonings Involving Opioid Analgesics in the [place], 1999-2006. Song IA, Choi HR, Oh TK. Long-term opioid use and mortality in patients with chronic non-cancer pain: Ten-year follow-up study in [place] from 2010 through 2019. EClinicalMedicine. 2022 Jul 18;51:101558. doi: 10.1016/j.eclinm.2022.101558. PMID: 35875817; PMCID: PMC9304910. Huser, W., Schubert, T., Vogelmann, T. et al. All-cause mortality in patients with long-term opioid therapy compared with non-opioid analgesics for chronic non-cancer pain: a database study. BMC Med 18, 162 (2020). https://doi.org/10.1186/[email or web address] Rashidian H, Zendehdel K, Kamangar F, Malekzadeh R, Haghdoost AA. An Ecological Study of the Association between Opiate Use and Incidence of Cancers. Addict Health. 2016 Fall;8(4):252-260. PMID: 28819556; PMCID: PMC5554805.  Our Goal: Our goal is to control your pain with means other than the use of opioid pain medications.  Our Recommendation: Talk to your physician about coming off of these medications. We can assist you with the tapering down and stopping these medicines. Based on the new information, even if you cannot completely stop the medication, a decrease in the dose may be associated with a lesser risk. Ask for other means of controlling the pain. Decrease or eliminate those factors that significantly contribute to your pain such as smoking, obesity, and a diet heavily tilted towards "inflammatory" nutrients.  Last Updated: 10/11/2022    ____________________________________________________________________________________________     ____________________________________________________________________________________________  Patient Information update  To: All of our patients.  Re: Name change.  It has been made official that our current name, "[facility]"   will soon be changed to " [facility]".   The purpose of this change is to eliminate any confusion created by the concept of our practice being a "Medication Management Pain Clinic". In the past this has led to the misconception that we treat pain primarily by the use of prescription medications.  Nothing can be farther from the truth.   Understanding PAIN MANAGEMENT: To further understand what our practice does, you first have to understand that "Pain Management" is a subspecialty that requires additional training once a physician has completed their specialty training, which can be in either Anesthesia, Neurology, Psychiatry, or Physical Medicine and Rehabilitation (PMR). Each one of these contributes to the final approach taken by each physician to   the management of their patient's pain. To be a "Pain Management Specialist" you must have first completed one of the specialty trainings below.  Anesthesiologists - trained in clinical pharmacology and interventional techniques such as nerve blockade and regional as well as central neuroanatomy. They are trained to block pain before, during, and after surgical interventions.  Neurologists - trained in the diagnosis and pharmacological treatment of complex neurological conditions, such as Multiple Sclerosis, Parkinson's, spinal cord injuries, and other systemic conditions that may be associated with symptoms that may include but are not limited to pain. They tend to rely primarily on the treatment of chronic pain  using prescription medications.  Psychiatrist - trained in conditions affecting the psychosocial wellbeing of patients including but not limited to depression, anxiety, schizophrenia, personality disorders, addiction, and other substance use disorders that may be associated with chronic pain. They tend to rely primarily on the treatment of chronic pain using prescription medications.   Physical Medicine and Rehabilitation (PMR) physicians, also known as physiatrists - trained to treat a wide variety of medical conditions affecting the brain, spinal cord, nerves, bones, joints, ligaments, muscles, and tendons. Their training is primarily aimed at treating patients that have suffered injuries that have caused severe physical impairment. Their training is primarily aimed at the physical therapy and rehabilitation of those patients. They may also work alongside orthopedic surgeons or neurosurgeons using their expertise in assisting surgical patients to recover after their surgeries.  INTERVENTIONAL PAIN MANAGEMENT is sub-subspecialty of Pain Management.  Our physicians are Board-certified in Anesthesia, Pain Management, and Interventional Pain Management.  This meaning that not only have they been trained and Board-certified in their specialty of Anesthesia, and subspecialty of Pain Management, but they have also received further training in the sub-subspecialty of Interventional Pain Management, in order to become Board-certified as INTERVENTIONAL PAIN MANAGEMENT SPECIALIST.    Mission: Our goal is to use our skills in  INTERVENTIONAL PAIN MANAGEMENT as alternatives to the chronic use of prescription opioid medications for the treatment of pain. To make this more clear, we have changed our name to reflect what we do and offer. We will continue to offer medication management assessment and recommendations, but we will not be taking over any patient's medication  management.  ____________________________________________________________________________________________     ____________________________________________________________________________________________  National Pain Medication Shortage  The [place] is experiencing worsening drug shortages. These have had a negative widespread effect on patient care and treatment. Not expected to improve any time soon. Predicted to last past 2029.   Drug shortage list (generic names) Oxycodone IR Oxycodone/APAP Oxymorphone IR Hydromorphone Hydrocodone/APAP Morphine  Where is the problem?  Manufacturing and supply level.  Will this shortage affect you?  Only if you take any of the above pain medications.  How? You may be unable to fill your prescription.  Your pharmacist may offer a "partial fill" of your prescription. (Warning: Do not accept partial fills.) Prescriptions partially filled cannot be transferred to another pharmacy. Read our Medication Rules and Regulation. Depending on how much medicine you are dependent on, you may experience withdrawals when unable to get the medication.  Recommendations: Consider ending your dependence on opioid pain medications. Ask your pain specialist to assist you with the process. Consider switching to a medication currently not in shortage, such as Buprenorphine. Talk to your pain specialist about this option. Consider decreasing your pain medication requirements by managing tolerance thru "Drug Holidays". This may help minimize withdrawals, should you run out of medicine. Control your pain thru   the use of non-pharmacological interventional therapies.   Your prescriber: Prescribers cannot be blamed for shortages. Medication manufacturing and supply issues cannot be fixed by the prescriber.   NOTE: The prescriber is not responsible for supplying the medication, or solving supply issues. Work with your pharmacist to solve it. The patient is responsible for  the decision to take or continue taking the medication and for identifying and securing a legal supply source. By law, supplying the medication is the job and responsibility of the pharmacy. The prescriber is responsible for the evaluation, monitoring, and prescribing of these medications.   Prescribers will NOT: Re-issue prescriptions that have been partially filled. Re-issue prescriptions already sent to a pharmacy.  Re-send prescriptions to a different pharmacy because yours did not have your medication. Ask pharmacist to order more medicine or transfer the prescription to another pharmacy. (Read below.)  New 2023 regulation: "April 14, 2022 Revised Regulation Allows DEA-Registered Pharmacies to Transfer Electronic Prescriptions at a Patient's Request DEA Headquarters Division - Public Information Office Patients now have the ability to request their electronic prescription be transferred to another pharmacy without having to go back to their practitioner to initiate the request. This revised regulation went into effect on Monday, April 10, 2022.     At a patient's request, a DEA-registered retail pharmacy can now transfer an electronic prescription for a controlled substance (schedules II-V) to another DEA-registered retail pharmacy. Prior to this change, patients would have to go through their practitioner to cancel their prescription and have it re-issued to a different pharmacy. The process was taxing and time consuming for both patients and practitioners.    The Drug Enforcement Administration (DEA) published its intent to revise the process for transferring electronic prescriptions on July 02, 2020.  The final rule was published in the federal register on March 09, 2022 and went into effect 30 days later.  Under the final rule, a prescription can only be transferred once between pharmacies, and only if allowed under existing state or other applicable law. The prescription must  remain in its electronic form; may not be altered in any way; and the transfer must be communicated directly between two licensed pharmacists. It's important to note, any authorized refills transfer with the original prescription, which means the entire prescription will be filled at the same pharmacy".  Reference: https://www.dea.gov/stories/2023/2023-04/2022-09-01/revised-regulation-allows-dea-registered-pharmacies-transfer (DEA website announcement)  https://www.govinfo.gov/content/pkg/FR-2022-03-09/pdf/2023-15847.pdf (Federal Register  Department of Justice)   Federal Register / Vol. 88, No. 143 / Thursday, March 09, 2022 / Rules and Regulations DEPARTMENT OF JUSTICE  Drug Enforcement Administration  21 CFR Part 1306  [Docket No. [id number]]  RIN [id number]-AB64 Transfer of Electronic Prescriptions for Schedules II-V Controlled Substances Between Pharmacies for Initial Filling  ____________________________________________________________________________________________     ____________________________________________________________________________________________  Transfer of Pain Medication between Pharmacies  Re: 2023 DEA Clarification on existing regulation  Published on DEA Website: April 14, 2022  Title: Revised Regulation Allows DEA-Registered Pharmacies to Transfer Electronic Prescriptions at a Patient's Request DEA Headquarters Division - Public Information Office  "Patients now have the ability to request their electronic prescription be transferred to another pharmacy without having to go back to their practitioner to initiate the request. This revised regulation went into effect on Monday, April 10, 2022.     At a patient's request, a DEA-registered retail pharmacy can now transfer an electronic prescription for a controlled substance (schedules II-V) to another DEA-registered retail pharmacy. Prior to this change, patients would have to go through their practitioner to  cancel their prescription   and have it re-issued to a different pharmacy. The process was taxing and time consuming for both patients and practitioners.    The Drug Enforcement Administration (DEA) published its intent to revise the process for transferring electronic prescriptions on July 02, 2020.  The final rule was published in the federal register on March 09, 2022 and went into effect 30 days later.  Under the final rule, a prescription can only be transferred once between pharmacies, and only if allowed under existing state or other applicable law. The prescription must remain in its electronic form; may not be altered in any way; and the transfer must be communicated directly between two licensed pharmacists. It's important to note, any authorized refills transfer with the original prescription, which means the entire prescription will be filled at the same pharmacy."    REFERENCES: 1. DEA website announcement https://www.dea.gov/stories/2023/2023-04/2022-09-01/revised-regulation-allows-dea-registered-pharmacies-transfer  2. Department of Justice website  https://www.govinfo.gov/content/pkg/FR-2022-03-09/pdf/2023-15847.pdf  3. DEPARTMENT OF JUSTICE Drug Enforcement Administration 21 CFR Part 1306 [Docket No. [id number]] RIN [id number] "Transfer of Electronic Prescriptions for Schedules II-V Controlled Substances Between Pharmacies for Initial Filling"  ____________________________________________________________________________________________     _______________________________________________________________________  Medication Rules  Purpose: To inform patients, and their family members, of our medication rules and regulations.  Applies to: All patients receiving prescriptions from our practice (written or electronic).  Pharmacy of record: This is the pharmacy where your electronic prescriptions will be sent. Make sure we have the correct one.  Electronic prescriptions: In  compliance with the [place] Strengthen Opioid Misuse Prevention (STOP) Act of 2017 (Session Law 2017-74/H243), effective August 14, 2018, all controlled substances must be electronically prescribed. Written prescriptions, faxing, or calling prescriptions to a pharmacy will no longer be done.  Prescription refills: These will be provided only during in-person appointments. No medications will be renewed without a "face-to-face" evaluation with your provider. Applies to all prescriptions.  NOTE: The following applies primarily to controlled substances (Opioid* Pain Medications).   Type of encounter (visit): For patients receiving controlled substances, face-to-face visits are required. (Not an option and not up to the patient.)  Patient's responsibilities: Pain Pills: Bring all pain pills to every appointment (except for procedure appointments). Pill Bottles: Bring pills in original pharmacy bottle. Bring bottle, even if empty. Always bring the bottle of the most recent fill.  Medication refills: You are responsible for knowing and keeping track of what medications you are taking and when is it that you will need a refill. The day before your appointment: write a list of all prescriptions that need to be refilled. The day of the appointment: give the list to the admitting nurse. Prescriptions will be written only during appointments. No prescriptions will be written on procedure days. If you forget a medication: it will not be "Called in", "Faxed", or "electronically sent". You will need to get another appointment to get these prescribed. No early refills. Do not call asking to have your prescription filled early. Partial  or short prescriptions: Occasionally your pharmacy may not have enough pills to fill your prescription.  NEVER ACCEPT a partial fill or a prescription that is short of the total amount of pills that you were prescribed.  With controlled substances the law allows 72 hours for  the pharmacy to complete the prescription.  If the prescription is not completed within 72 hours, the pharmacist will require a new prescription to be written. This means that you will be short on your medicine and we WILL NOT send another prescription to complete your original   prescription.  Instead, request the pharmacy to send a carrier to a nearby branch to get enough medication to provide you with your full prescription. Prescription Accuracy: You are responsible for carefully inspecting your prescriptions before leaving our office. Have the discharge nurse carefully go over each prescription with you, before taking them home. Make sure that your name is accurately spelled, that your address is correct. Check the name and dose of your medication to make sure it is accurate. Check the number of pills, and the written instructions to make sure they are clear and accurate. Make sure that you are given enough medication to last until your next medication refill appointment. Taking Medication: Take medication as prescribed. When it comes to controlled substances, taking less pills or less frequently than prescribed is permitted and encouraged. Never take more pills than instructed. Never take the medication more frequently than prescribed.  Inform other Doctors: Always inform, all of your healthcare providers, of all the medications you take. Pain Medication from other Providers: You are not allowed to accept any additional pain medication from any other Doctor or Healthcare provider. There are two exceptions to this rule. (see below) In the event that you require additional pain medication, you are responsible for notifying us, as stated below. Cough Medicine: Often these contain an opioid, such as codeine or hydrocodone. Never accept or take cough medicine containing these opioids if you are already taking an opioid* medication. The combination may cause respiratory failure and death. Medication Agreement:  You are responsible for carefully reading and following our Medication Agreement. This must be signed before receiving any prescriptions from our practice. Safely store a copy of your signed Agreement. Violations to the Agreement will result in no further prescriptions. (Additional copies of our Medication Agreement are available upon request.) Laws, Rules, & Regulations: All patients are expected to follow all Federal and State Laws, Statutes, Rules, & Regulations. Ignorance of the Laws does not constitute a valid excuse.  Illegal drugs and Controlled Substances: The use of illegal substances (including, but not limited to marijuana and its derivatives) and/or the illegal use of any controlled substances is strictly prohibited. Violation of this rule may result in the immediate and permanent discontinuation of any and all prescriptions being written by our practice. The use of any illegal substances is prohibited. Adopted CDC guidelines & recommendations: Target dosing levels will be at or below 60 MME/day. Use of benzodiazepines** is not recommended.  Exceptions: There are only two exceptions to the rule of not receiving pain medications from other Healthcare Providers. Exception #1 (Emergencies): In the event of an emergency (i.e.: accident requiring emergency care), you are allowed to receive additional pain medication. However, you are responsible for: As soon as you are able, call our office [phone], at any time of the day or night, and leave a message stating your name, the date and nature of the emergency, and the name and dose of the medication prescribed. In the event that your call is answered by a member of our staff, make sure to document and save the date, time, and the name of the person that took your information.  Exception #2 (Planned Surgery): In the event that you are scheduled by another doctor or dentist to have any type of surgery or procedure, you are allowed (for a period no  longer than 30 days), to receive additional pain medication, for the acute post-op pain. However, in this case, you are responsible for picking up a copy of   our "Post-op Pain Management for Surgeons" handout, and giving it to your surgeon or dentist. This document is available at our office, and does not require an appointment to obtain it. Simply go to our office during business hours (Monday-Thursday from 8:00 AM to 4:00 PM) (Friday 8:00 AM to 12:00 Noon) or if you have a scheduled appointment with us, prior to your surgery, and ask for it by name. In addition, you are responsible for: calling our office [phone], at any time of the day or night, and leaving a message stating your name, name of your surgeon, type of surgery, and date of procedure or surgery. Failure to comply with your responsibilities may result in termination of therapy involving the controlled substances. Medication Agreement Violation. Following the above rules, including your responsibilities will help you in avoiding a Medication Agreement Violation ("Breaking your Pain Medication Contract").  Consequences:  Not following the above rules may result in permanent discontinuation of medication prescription therapy.  *Opioid medications include: morphine, codeine, oxycodone, oxymorphone, hydrocodone, hydromorphone, meperidine, tramadol, tapentadol, buprenorphine, fentanyl, methadone. **Benzodiazepine medications include: diazepam (Valium), alprazolam (Xanax), clonazepam (Klonopine), lorazepam (Ativan), clorazepate (Tranxene), chlordiazepoxide (Librium), estazolam (Prosom), oxazepam (Serax), temazepam (Restoril), triazolam (Halcion) (Last updated: 06/06/2022) ______________________________________________________________________    ______________________________________________________________________  Medication Recommendations and Reminders  Applies to: All patients receiving prescriptions (written and/or  electronic).  Medication Rules & Regulations: You are responsible for reading, knowing, and following our "Medication Rules" document. These exist for your safety and that of others. They are not flexible and neither are we. Dismissing or ignoring them is an act of "non-compliance" that may result in complete and irreversible termination of such medication therapy. For safety reasons, "non-compliance" will not be tolerated. As with the [place] fundamental legal principle of "ignorance of the law is no defense", we will accept no excuses for not having read and knowing the content of documents provided to you by our practice.  Pharmacy of record:  Definition: This is the pharmacy where your electronic prescriptions will be sent.  We do not endorse any particular pharmacy. It is up to you and your insurance to decide what pharmacy to use.  We do not restrict you in your choice of pharmacy. However, once we write for your prescriptions, we will NOT be re-sending more prescriptions to fix restricted supply problems created by your pharmacy, or your insurance.  The pharmacy listed in the electronic medical record should be the one where you want electronic prescriptions to be sent. If you choose to change pharmacy, simply notify our nursing staff. Changes will be made only during your regular appointments and not over the phone.  Recommendations: Keep all of your pain medications in a safe place, under lock and key, even if you live alone. We will NOT replace lost, stolen, or damaged medication. We do not accept "Police Reports" as proof of medications having been stolen. After you fill your prescription, take 1 week's worth of pills and put them away in a safe place. You should keep a separate, properly labeled bottle for this purpose. The remainder should be kept in the original bottle. Use this as your primary supply, until it runs out. Once it's gone, then you know that you have 1 week's worth of medicine,  and it is time to come in for a prescription refill. If you do this correctly, it is unlikely that you will ever run out of medicine. To make sure that the above recommendation works, it is very important that you make   sure your medication refill appointments are scheduled at least 1 week before you run out of medicine. To do this in an effective manner, make sure that you do not leave the office without scheduling your next medication management appointment. Always ask the nursing staff to show you in your prescription , when your medication will be running out. Then arrange for the receptionist to get you a return appointment, at least 7 days before you run out of medicine. Do not wait until you have 1 or 2 pills left, to come in. This is very poor planning and does not take into consideration that we may need to cancel appointments due to bad weather, sickness, or emergencies affecting our staff. DO NOT ACCEPT A "Partial Fill": If for any reason your pharmacy does not have enough pills/tablets to completely fill or refill your prescription, do not allow for a "partial fill". The law allows the pharmacy to complete that prescription within 72 hours, without requiring a new prescription. If they do not fill the rest of your prescription within those 72 hours, you will need a separate prescription to fill the remaining amount, which we will NOT provide. If the reason for the partial fill is your insurance, you will need to talk to the pharmacist about payment alternatives for the remaining tablets, but again, DO NOT ACCEPT A PARTIAL FILL, unless you can trust your pharmacist to obtain the remainder of the pills within 72 hours.  Prescription refills and/or changes in medication(s):  Prescription refills, and/or changes in dose or medication, will be conducted only during scheduled medication management appointments. (Applies to both, written and electronic prescriptions.) No refills on procedure days. No  medication will be changed or started on procedure days. No changes, adjustments, and/or refills will be conducted on a procedure day. Doing so will interfere with the diagnostic portion of the procedure. No phone refills. No medications will be "called into the pharmacy". No Fax refills. No weekend refills. No Holliday refills. No after hours refills.  Remember:  Business hours are:  Monday to Thursday 8:00 AM to 4:00 PM Provider's Schedule: Marquese Burkland, MD - Appointments are:  Medication management: Monday and Wednesday 8:00 AM to 4:00 PM Procedure day: Tuesday and Thursday 7:30 AM to 4:00 PM Bilal Lateef, MD - Appointments are:  Medication management: Tuesday and Thursday 8:00 AM to 4:00 PM Procedure day: Monday and Wednesday 7:30 AM to 4:00 PM (Last update: 06/06/2022) ______________________________________________________________________    ____________________________________________________________________________________________  Drug Holidays  What is a "Drug Holiday"? Drug Holiday: is the name given to the process of slowly tapering down and temporarily stopping the pain medication for the purpose of decreasing or eliminating tolerance to the drug.  Benefits Improved effectiveness Decreased required effective dose Improved pain control End dependence on high dose therapy Decrease cost of therapy Uncovering "opioid-induced hyperalgesia". (OIH)  What is "opioid hyperalgesia"? It is a paradoxical increase in pain caused by exposure to opioids. Stopping the opioid pain medication, contrary to the expected, it actually decreases or completely eliminates the pain. Ref.: "A comprehensive review of opioid-induced hyperalgesia". Marion Lee, et.al. Pain Physician. 2011 Mar-Apr;14(2):145-61.  What is tolerance? Tolerance: the progressive loss of effectiveness of a pain medicine due to repetitive use. A common problem of opioid pain medications.  How long should a "Drug  Holiday" last? Effectiveness depends on the patient staying off all opioid pain medicines for a minimum of 14 consecutive days. (2 weeks)  How about just taking less of the medicine? Does not   work. Will not accomplish goal of eliminating the excess receptors.  How about switching to a different pain medicine? (AKA. "Opioid rotation") Does not work. Creates the illusion of effectiveness by taking advantage of inaccurate equivalent dose calculations between different opioids. -This "technique" was promoted by studies funded by pharmaceutical companies, such as [employer], creators of "OxyContin".  Can I stop the medicine "cold turkey"? We do not recommend it. You should always coordinate with your prescribing physician to make the transition as smoothly as possible. Avoid stopping the medicine abruptly without consulting. We recommend a "slow taper".  What is a slow taper? Taper: refers to the gradual decrease in dose.   How do I stop/taper the dose? Slowly. Decrease the daily amount of pills that you take by one (1) pill every seven (7) days. This is called a "slow downward taper". Example: if you normally take four (4) pills per day, drop it to three (3) pills per day for seven (7) days, then to two (2) pills per day for seven (7) days, then to one (1) per day for seven (7) days, and then stop the medicine. The 14 day "Drug Holiday" starts on the first day without medicine.   Will I experience withdrawals? Unlikely with a slow taper.  What triggers withdrawals? Withdrawals are triggered by the sudden/abrupt stop of high dose opioids. Withdrawals can be avoided by slowly decreasing the dose over a prolonged period of time.  What are withdrawals? Symptoms associated with sudden/abrupt reduction/stopping of high-dose, long-term use of pain medication. Withdrawal are seldom seen on low dose therapy, or patients rarely taking opioid medication.  Early Withdrawal Symptoms may  include: Agitation Anxiety Muscle aches Increased tearing Insomnia Runny nose Sweating Yawning  Late symptoms may include: Abdominal cramping Diarrhea Dilated pupils Goose bumps Nausea Vomiting  When could I see withdrawals? Onset: 8-24 hours after last use for most opioids. 12-48 hours for long-acting opioids (i.e.: methadone)  How long could they last? Duration: 4-10 days for most opioids. 14-21 days for long-acting opioids (i.e.: methadone)  What will happen after I complete my "Drug Holiday"? The need and indications for the opioid analgesic will be reviewed before restarting the medication. Dose requirements will likely decrease and the dose will need to be adjusted accordingly.   (Last update: 11/01/2022) ____________________________________________________________________________________________    ____________________________________________________________________________________________  WARNING: CBD (cannabidiol) & Delta (Delta-8 tetrahydrocannabinol) products.   Applicable to:  All individuals currently taking or considering taking CBD (cannabidiol) and, more important, all patients taking opioid analgesic controlled substances (pain medication). (Example: oxycodone; oxymorphone; hydrocodone; hydromorphone; morphine; methadone; tramadol; tapentadol; fentanyl; buprenorphine; butorphanol; dextromethorphan; meperidine; codeine; etc.)  Introduction:  Recently there has been a drive towards the use of "natural" products for the treatment of different conditions, including pain anxiety and sleep disorders. Marijuana and hemp are two varieties of the cannabis genus plants. Marijuana and its derivatives are illegal, while hemp and its derivatives are not. Cannabidiol (CBD) and tetrahydrocannabinol (THC), are two natural compounds found in plants of the Cannabis genus. They can both be extracted from hemp or marijuana. Both compounds interact with your body's endocannabinoid  system in very different ways. CBD is associated with pain relief (analgesia) while THC is associated with the psychoactive effects ("the high") obtained from the use of marijuana products. There are two main types of THC: Delta-9, which comes from the marijuana plant and it is illegal, and Delta-8, which comes from the hemp plant, and it is legal. (Both, Delta-9-THC and Delta-8-THC are psychoactive and   give you "the high".)   Legality:  Marijuana and its derivatives: illegal Hemp and its derivatives: Legal (State dependent) UPDATE: (09/30/2021) The Drug Enforcement Agency (DEA) issued a letter stating that "delta" cannabinoids, including Delta-8-THCO and Delta-9-THCO, synthetically derived from hemp do not qualify as hemp and will be viewed as Schedule I drugs. (Schedule I drugs, substances, or chemicals are defined as drugs with no currently accepted medical use and a high potential for abuse. Some examples of Schedule I drugs are: heroin, lysergic acid diethylamide (LSD), marijuana (cannabis), 3,4-methylenedioxymethamphetamine (ecstasy), methaqualone, and peyote.) (https://www.dea.gov)  Legal status of CBD in [place]:  "Conditionally Legal"  Reference: "FDA Regulation of Cannabis and Cannabis-Derived Products, Including Cannabidiol (CBD)" - https://www.fda.gov/news-events/public-health-focus/fda-regulation-cannabis-and-cannabis-derived-products-including-cannabidiol-cbd  Warning:  CBD is not FDA approved and has not undergo the same manufacturing controls as prescription drugs.  This means that the purity and safety of available CBD may be questionable. Most of the time, despite manufacturer's claims, it is contaminated with THC (delta-9-tetrahydrocannabinol - the chemical in marijuana responsible for the "HIGH").  When this is the case, the THC contaminant will trigger a positive urine drug screen (UDS) test for Marijuana (carboxy-THC).   The FDA recently put out a warning about 5 things that everyone  should be aware of regarding Delta-8 THC: Delta-8 THC products have not been evaluated or approved by the FDA for safe use and may be marketed in ways that put the public health at risk. The FDA has received adverse event reports involving delta-8 THC-containing products. Delta-8 THC has psychoactive and intoxicating effects. Delta-8 THC manufacturing often involve use of potentially harmful chemicals to create the concentrations of delta-8 THC claimed in the marketplace. The final delta-8 THC product may have potentially harmful by-products (contaminants) due to the chemicals used in the process. Manufacturing of delta-8 THC products may occur in uncontrolled or unsanitary settings, which may lead to the presence of unsafe contaminants or other potentially harmful substances. Delta-8 THC products should be kept out of the reach of children and pets.  NOTE: Because a positive UDS for any illicit substance is a violation of our medication agreement, your opioid analgesics (pain medicine) may be permanently discontinued.  MORE ABOUT CBD  General Information: CBD was discovered in 1940 and it is a derivative of the cannabis sativa genus plants (Marijuana and Hemp). It is one of the 113 identified substances found in Marijuana. It accounts for up to 40% of the plant's extract. As of 2018, preliminary clinical studies on CBD included research for the treatment of anxiety, movement disorders, and pain. CBD is available and consumed in multiple forms, including inhalation of smoke or vapor, as an aerosol spray, and by mouth. It may be supplied as an oil containing CBD, capsules, dried cannabis, or as a liquid solution. CBD is thought not to be as psychoactive as THC (delta-9-tetrahydrocannabinol - the chemical in marijuana responsible for the "HIGH"). Studies suggest that CBD may interact with different biological target receptors in the body, including cannabinoid and other neurotransmitter receptors. As of  2018 the mechanism of action for its biological effects has not been determined.  Side-effects  Adverse reactions: Dry mouth, diarrhea, decreased appetite, fatigue, drowsiness, malaise, weakness, sleep disturbances, and others.  Drug interactions:  CBD may interact with medications such as blood-thinners. CBD causes drowsiness on its own and it will increase drowsiness caused by other medications, including antihistamines (such as Benadryl), benzodiazepines (Xanax, Ativan, Valium), antipsychotics, antidepressants, opioids, alcohol and supplements such as kava, melatonin and St. John's Wort.    Other drug interactions: Brivaracetam (Briviact); Caffeine; Carbamazepine (Tegretol); Citalopram (Celexa); Clobazam (Onfi); Eslicarbazepine (Aptiom); Everolimus (Zostress); Lithium; Methadone (Dolophine); Rufinamide (Banzel); Sedative medications (CNS depressants); Sirolimus (Rapamune); Stiripentol (Diacomit); Tacrolimus (Prograf); Tamoxifen ; Soltamox); Topiramate (Topamax); Valproate; Warfarin (Coumadin); Zonisamide. (Last update: 07/24/2022) ____________________________________________________________________________________________   ____________________________________________________________________________________________  Naloxone Nasal Spray  Why am I receiving this medication? [place] STOP ACT requires that all patients taking high dose opioids or at risk of opioids respiratory depression, be prescribed an opioid reversal agent, such as Naloxone (AKA: Narcan).  What is this medication? NALOXONE (nal OX one) treats opioid overdose, which causes slow or shallow breathing, severe drowsiness, or trouble staying awake. Call emergency services after using this medication. You may need additional treatment. Naloxone works by reversing the effects of opioids. It belongs to a group of medications called opioid blockers.  COMMON BRAND NAME(S): Kloxxado, Narcan  What should I tell my care team before  I take this medication? They need to know if you have any of these conditions: Heart disease Substance use disorder An unusual or allergic reaction to naloxone, other medications, foods, dyes, or preservatives Pregnant or trying to get pregnant Breast-feeding  When to use this medication? This medication is to be used for the treatment of respiratory depression (less than 8 breaths per minute) secondary to opioid overdose.   How to use this medication? This medication is for use in the nose. Lay the person on their back. Support their neck with your hand and allow the head to tilt back before giving the medication. The nasal spray should be given into 1 nostril. After giving the medication, move the person onto their side. Do not remove or test the nasal spray until ready to use. Get emergency medical help right away after giving the first dose of this medication, even if the person wakes up. You should be familiar with how to recognize the signs and symptoms of a narcotic overdose. If more doses are needed, give the additional dose in the other nostril. Talk to your care team about the use of this medication in children. While this medication may be prescribed for children as young as newborns for selected conditions, precautions do apply.  Naloxone Overdosage: If you think you have taken too much of this medicine contact a poison control center or emergency room at once.  NOTE: This medicine is only for you. Do not share this medicine with others.  What if I miss a dose? This does not apply.  What may interact with this medication? This is only used during an emergency. No interactions are expected during emergency use. This list may not describe all possible interactions. Give your health care provider a list of all the medicines, herbs, non-prescription drugs, or dietary supplements you use. Also tell them if you smoke, drink alcohol, or use illegal drugs. Some items may interact with  your medicine.  What should I watch for while using this medication? Keep this medication ready for use in the case of an opioid overdose. Make sure that you have the phone number of your care team and local hospital ready. You may need to have additional doses of this medication. Each nasal spray contains a single dose. Some emergencies may require additional doses. After use, bring the treated person to the nearest hospital or call 911. Make sure the treating care team knows that the person has received a dose of this medication. You will receive additional instructions on what to do during and after use of this   medication before an emergency occurs.  What side effects may I notice from receiving this medication? Side effects that you should report to your care team as soon as possible: Allergic reactions--skin rash, itching, hives, swelling of the face, lips, tongue, or throat Side effects that usually do not require medical attention (report these to your care team if they continue or are bothersome): Constipation Dryness or irritation inside the nose Headache Increase in blood pressure Muscle spasms Stuffy nose Toothache This list may not describe all possible side effects. Call your doctor for medical advice about side effects. You may report side effects to FDA at 1-800-FDA-1088.  Where should I keep my medication? Because this is an emergency medication, you should keep it with you at all times.  Keep out of the reach of children and pets. Store between 20 and 25 degrees C (68 and 77 degrees F). Do not freeze. Throw away any unused medication after the expiration date. Keep in original box until ready to use.  NOTE: This sheet is a summary. It may not cover all possible information. If you have questions about this medicine, talk to your doctor, pharmacist, or health care provider.   2023 Elsevier/Gold Standard (2021-04-08  00:00:00)  ____________________________________________________________________________________________   

## 2022-11-28 NOTE — Progress Notes (Unsigned)
PROVIDER NOTE: Information contained herein reflects review and annotations entered in association with encounter. Interpretation of such information and data should be left to medically-trained personnel. Information provided to patient can be located elsewhere in the medical record under "Patient Instructions". Document created using STT-dictation technology, any transcriptional errors that may result from process are unintentional.    Patient: Teresa Mcgee  Service Category: E/M  Provider: Oswaldo Done, MD  DOB: 08-08-1966  DOS: 11/29/2022  Referring Provider: Judy Pimple, MD  MRN: [id number]  Specialty: Interventional Pain Management  PCP: Judy Pimple, MD  Type: Established Patient  Setting: Ambulatory outpatient    Location: Office  Delivery: Face-to-face     HPI  Teresa Mcgee, a 57 y.o. year old female, is here today because of her No primary diagnosis found.. Teresa Mcgee's primary complain today is No chief complaint on file.  Pertinent problems: Teresa Mcgee has Muscle spasms of neck; Numbness and tingling in both hands; Chronic low back pain (Bilateral) (L>R) w/ sciatica (Bilateral); Chronic lower extremity pain (2ry area of Pain) (Bilateral) (L>R); Chronic neck pain (3ry area of Pain) (Bilateral) (L>R); Chronic pain syndrome; Chronic upper extremity pain (4th area of Pain) (Bilateral) (L>R); Cervicalgia; Chronic musculoskeletal pain; DDD (degenerative disc disease), thoracic; DDD (degenerative disc disease), cervical; Lumbar facet arthropathy (Bilateral); Lumbar facet syndrome (Bilateral); Chronic low back pain (1ry area of Pain) (Bilateral) (L>R) w/o sciatica; Spondylosis without myelopathy or radiculopathy, cervical region; Spondylosis without myelopathy or radiculopathy, lumbosacral region; DDD (degenerative disc disease), lumbosacral; Strain of lumbar paraspinal muscle, sequela; Chronic hip pain (Bilateral) (R>L); Chronic sacroiliac joint pain (Left); Neurogenic pain;  Osteoarthritis involving multiple joints; and Chronic knee pain (Bilateral) (R>L) on their pertinent problem list. Pain Assessment: Severity of   is reported as a  /10. Location:    / . Onset:  . Quality:  . Timing:  . Modifying factor(s):  Marland Kitchen Vitals:  vitals were not taken for this visit.  BMI: Estimated body mass index is 41.81 kg/m as calculated from the following:   Height as of 08/23/22:  (1.6 m).   Weight as of 08/23/22: 236 lb (107 kg). Last encounter: 08/23/2022. Last procedure: Visit date not found.  Reason for encounter: medication management. ***  RTCB: 03/01/2023   Pharmacotherapy Assessment  Analgesic: Oxycodone IR 5 mg, 1 tab PO q 8 hrs (PRN) (15 mg/day of oxycodone) MME/day: 22.5 mg/day.   Monitoring: [place] PMP: PDMP reviewed during this encounter.       Pharmacotherapy: No side-effects or adverse reactions reported. Compliance: No problems identified. Effectiveness: Clinically acceptable.  No notes on file  No results found for: "CBDTHCR" No results found for: "D8THCCBX" No results found for: "D9THCCBX"  UDS:  Summary  Date Value Ref Range Status  02/27/2022 Note  Final    Comment:    ==================================================================== [employer] Select 13 (MW) ==================================================================== Test                             Result       Flag       Units  Drug Present and Declared for Prescription Verification   Oxymorphone                    446          EXPECTED   ng/mg creat    Sources of oxymorphone include scheduled prescription medications;    it is also an expected  metabolite of oxycodone.  Drug Absent but Declared for Prescription Verification   Oxycodone                      Not Detected UNEXPECTED ng/mg creat    Oxycodone is almost always present in patients taking this drug    consistently.  Absence of oxycodone could be due to lapse of time    since the last dose or unusual pharmacokinetics  (rapid metabolism).  ==================================================================== Test                      Result    Flag   Units      Ref Range   Creatinine              28               mg/dL      >=16 ==================================================================== Declared Medications:  The flagging and interpretation on this report are based on the  following declared medications.  Unexpected results may arise from  inaccuracies in the declared medications.   **Note: The testing scope of this panel includes these medications:   Oxycodone (Roxicodone)   **Note: The testing scope of this panel does not include the  following reported medications:   Albuterol (Ventolin HFA)  Amlodipine (Norvasc)  Azelastine (Optivar)  Bempedoic Acid (Nexletol)  Eye Drop  Ezetimibe (Zetia)  Fluticasone (Flonase)  Gabapentin (Neurontin)  Hydrochlorothiazide (Hydrodiuril)  Levocetirizine (Xyzal)  Levothyroxine (Synthroid)  Magnesium (Mag-Ox)  Meloxicam (Mobic)  Metformin (Glucophage)  Olopatadine  Potassium (Klor-Con)  Rosuvastatin (Crestor)  Tizanidine (Zanaflex)  Vitamin D3 ==================================================================== For clinical consultation, please call [phone]. ====================================================================       ROS  Constitutional: Denies any fever or chills Gastrointestinal: No reported hemesis, hematochezia, vomiting, or acute GI distress Musculoskeletal: Denies any acute onset joint swelling, redness, loss of ROM, or weakness Neurological: No reported episodes of acute onset apraxia, aphasia, dysarthria, agnosia, amnesia, paralysis, loss of coordination, or loss of consciousness  Medication Review  Accu-Chek FastClix Lancets, Bempedoic Acid, Carboxymethylcellulose Sod PF, Cholecalciferol, Magnesium Oxide -Mg Supplement, Olopatadine HCl, albuterol, amLODipine, azelastine, blood glucose meter kit and supplies,  ezetimibe, fluticasone, gabapentin, glucose blood, hydrochlorothiazide, levocetirizine, levothyroxine, meloxicam, metFORMIN, naloxone, oxyCODONE, potassium chloride, and tiZANidine  History Review  Allergy: Teresa Mcgee is allergic to statins. Drug: Teresa Mcgee  reports no history of drug use. Alcohol:  reports no history of alcohol use. Tobacco:  reports that she has been smoking cigarettes. She has a 10.00 pack-year smoking history. She has never used smokeless tobacco. Social: Teresa Mcgee  reports that she has been smoking cigarettes. She has a 10.00 pack-year smoking history. She has never used smokeless tobacco. She reports that she does not drink alcohol and does not use drugs. Medical:  has a past medical history of Arthritis, Bell's palsy (may 2012), Chest pain, Diabetes mellitus without complication (HCC), GERD (gastroesophageal reflux disease), H/O hiatal hernia, Hyperlipidemia, Hypertension, Hypothyroidism, Shingles, Shortness of breath, and Sleep apnea. Surgical: Teresa Mcgee  has a past surgical history that includes Thyroidectomy (1998); cesarian; Partial hysterectomy (2000); Cesarean section; Tubal ligation; Tooth extraction; Abdominal hysterectomy; Colonoscopy with propofol (N/A, 11/27/2019); and Esophagogastroduodenoscopy (egd) with propofol (N/A, 07/20/2020). Family: family history includes Breast cancer in her maternal aunt; Diabetes in her brother and sister; Heart attack (age of onset: 75) in her brother; Heart attack (age of onset: 19) in her sister; Heart attack (age of onset: 58) in her father; Stroke (age of  onset: 87) in her brother; Stroke (age of onset: 41) in her sister.  Laboratory Chemistry Profile   Renal Lab Results  Component Value Date   BUN 9 03/09/2022   CREATININE 0.94 03/09/2022   BCR 8 (L) 01/24/2018   GFR 68.26 03/09/2022   GFRAA >60 05/25/2018   GFRNONAA >60 05/25/2018    Hepatic Lab Results  Component Value Date   AST 128 (H) 08/01/2022   ALT 85 (H)  08/01/2022   ALBUMIN 4.3 08/01/2022   ALKPHOS 67 08/01/2022   LIPASE 25 05/25/2018    Electrolytes Lab Results  Component Value Date   NA 140 03/09/2022   K 3.7 03/09/2022   CL 100 03/09/2022   CALCIUM 9.9 03/09/2022   MG 1.7 02/21/2022    Bone Lab Results  Component Value Date   VD25OH 68.72 08/19/2021   25OHVITD1 9.3 (L) 01/24/2018   25OHVITD2 <1.0 01/24/2018   25OHVITD3 9.3 01/24/2018    Inflammation (CRP: Acute Phase) (ESR: Chronic Phase) Lab Results  Component Value Date   CRP 5.5 (H) 01/24/2018   ESRSEDRATE 40 01/24/2018         Note: Above Lab results reviewed.  Recent Imaging Review  US Abdomen Complete CLINICAL DATA:  Elevated LFTs  EXAM: ABDOMEN ULTRASOUND COMPLETE  COMPARISON:  Abdominal ultrasound 06/12/2007  FINDINGS: Gallbladder: No gallstones or wall thickening visualized. No sonographic Murphy sign noted by sonographer.  Common bile duct: Diameter: 3 mm  Liver: Coarse, increased echogenicity of the parenchyma with no focal mass identified. Portal vein is patent on color Doppler imaging with normal direction of blood flow towards the liver.  IVC: No abnormality visualized.  Pancreas: Not well visualized.  Spleen: Size and appearance within normal limits.  Right Kidney: Length: 10.9 cm. Echogenicity within normal limits. No mass or hydronephrosis visualized.  Left Kidney: Length: 11.2 cm. Echogenicity within normal limits. No mass or hydronephrosis visualized.  Abdominal aorta: No aneurysm visualized.  Other findings: None.  IMPRESSION: Abnormal appearance of the liver parenchyma suggesting hepatic steatosis and/or other hepatocellular disease.  Electronically Signed   By: Jannifer Hick M.D.   On: 02/02/2022 11:13 Note: Reviewed        Physical Exam  General appearance: Well nourished, well developed, and well hydrated. In no apparent acute distress Mental status: Alert, oriented x 3 (person, place, & time)        Respiratory: No evidence of acute respiratory distress Eyes: PERLA Vitals: LMP  (LMP Unknown)  BMI: Estimated body mass index is 41.81 kg/m as calculated from the following:   Height as of 08/23/22: 5\' 3"  (1.6 m).   Weight as of 08/23/22: 236 lb (107 kg). Ideal: Patient weight not recorded  Assessment   Diagnosis Status  1. Chronic pain syndrome   2. Pharmacologic therapy   3. DDD (degenerative disc disease), lumbosacral   4. Chronic neck pain (3ry area of Pain) (Bilateral) (L>R)   5. Chronic sacroiliac joint pain (Left)   6. Uncomplicated opioid dependence   7. Encounter for medication management   8. Encounter for chronic pain management   9. Chronic upper extremity pain (4th area of Pain) (Bilateral) (L>R)   10. Chronic use of opiate for therapeutic purpose   11. Lumbar facet syndrome (Bilateral)   12. Chronic knee pain (Bilateral) (R>L)   13. Chronic lower extremity pain (2ry area of Pain) (Bilateral) (L>R)   14. Chronic low back pain (1ry area of Pain) (Bilateral) (L>R) w/o sciatica   15. Chronic hip pain (Bilateral) (  R>L)    Controlled Controlled Controlled   Updated Problems: No problems updated.  Plan of Care  Problem-specific:  No problem-specific Assessment & Plan notes found for this encounter.  Ms. DAMANI Mcgee has a current medication list which includes the following long-term medication(s): albuterol, amlodipine, ezetimibe, fluticasone, gabapentin, hydrochlorothiazide, levocetirizine, levothyroxine, magnesium oxide -mg supplement, meloxicam, metformin, oxycodone, oxycodone, and potassium chloride.  Pharmacotherapy (Medications Ordered): No orders of the defined types were placed in this encounter.  Orders:  No orders of the defined types were placed in this encounter.  Follow-up plan:   No follow-ups on file.      Interventional Therapies  Risk Factors  Considerations:  NOTE: NO RFA until BMI <35    Planned  Pending:      Under  consideration:   Therapeutic/palliative bilateral lumbar facet MBB #3  Possible bilateral lumbar facet RFA #1    Completed:   Diagnostic bilateral lumbar facet MBB x2 (07/23/2018) (100/100/100/0)    Completed by other providers:   None at this time   Therapeutic  Palliative (PRN) options:   Palliative bilateral lumbar facet block    Pharmacotherapy  Nonopioids transfer 08/11/2020: Gabapentin, meloxicam, and Zanaflex       Recent Visits No visits were found meeting these conditions. Showing recent visits within past 90 days and meeting all other requirements Future Appointments Date Type Provider Dept  11/29/22 Appointment Delano Metz, MD [facility]  Showing future appointments within next 90 days and meeting all other requirements  I discussed the assessment and treatment plan with the patient. The patient was provided an opportunity to ask questions and all were answered. The patient agreed with the plan and demonstrated an understanding of the instructions.  Patient advised to call back or seek an in-person evaluation if the symptoms or condition worsens.  Duration of encounter: *** minutes.  Total time on encounter, as per AMA guidelines included both the face-to-face and non-face-to-face time personally spent by the physician and/or other qualified health care professional(s) on the day of the encounter (includes time in activities that require the physician or other qualified health care professional and does not include time in activities normally performed by clinical staff). Physician's time may include the following activities when performed: Preparing to see the patient (e.g., pre-charting review of records, searching for previously ordered imaging, lab work, and nerve conduction tests) Review of prior analgesic pharmacotherapies. Reviewing PMP Interpreting ordered tests (e.g., lab work, imaging, nerve conduction tests) Performing post-procedure  evaluations, including interpretation of diagnostic procedures Obtaining and/or reviewing separately obtained history Performing a medically appropriate examination and/or evaluation Counseling and educating the patient/family/caregiver Ordering medications, tests, or procedures Referring and communicating with other health care professionals (when not separately reported) Documenting clinical information in the electronic or other health record Independently interpreting results (not separately reported) and communicating results to the patient/ family/caregiver Care coordination (not separately reported)  Note by: Oswaldo Done, MD Date: 11/29/2022; Time: 7:45 AM

## 2022-11-29 ENCOUNTER — Encounter: Payer: Self-pay | Admitting: Pain Medicine

## 2022-11-29 ENCOUNTER — Ambulatory Visit: Payer: Medicare HMO | Attending: Pain Medicine | Admitting: Pain Medicine

## 2022-11-29 DIAGNOSIS — M545 Low back pain, unspecified: Secondary | ICD-10-CM | POA: Insufficient documentation

## 2022-11-29 DIAGNOSIS — F1120 Opioid dependence, uncomplicated: Secondary | ICD-10-CM | POA: Diagnosis not present

## 2022-11-29 DIAGNOSIS — M79605 Pain in left leg: Secondary | ICD-10-CM | POA: Insufficient documentation

## 2022-11-29 DIAGNOSIS — Z79891 Long term (current) use of opiate analgesic: Secondary | ICD-10-CM | POA: Diagnosis present

## 2022-11-29 DIAGNOSIS — M79602 Pain in left arm: Secondary | ICD-10-CM | POA: Diagnosis not present

## 2022-11-29 DIAGNOSIS — M79601 Pain in right arm: Secondary | ICD-10-CM | POA: Diagnosis not present

## 2022-11-29 DIAGNOSIS — M25551 Pain in right hip: Secondary | ICD-10-CM | POA: Insufficient documentation

## 2022-11-29 DIAGNOSIS — M533 Sacrococcygeal disorders, not elsewhere classified: Secondary | ICD-10-CM | POA: Insufficient documentation

## 2022-11-29 DIAGNOSIS — G8929 Other chronic pain: Secondary | ICD-10-CM | POA: Insufficient documentation

## 2022-11-29 DIAGNOSIS — G894 Chronic pain syndrome: Secondary | ICD-10-CM | POA: Insufficient documentation

## 2022-11-29 DIAGNOSIS — M25562 Pain in left knee: Secondary | ICD-10-CM | POA: Insufficient documentation

## 2022-11-29 DIAGNOSIS — M25552 Pain in left hip: Secondary | ICD-10-CM | POA: Diagnosis present

## 2022-11-29 DIAGNOSIS — M542 Cervicalgia: Secondary | ICD-10-CM | POA: Diagnosis not present

## 2022-11-29 DIAGNOSIS — M47816 Spondylosis without myelopathy or radiculopathy, lumbar region: Secondary | ICD-10-CM | POA: Diagnosis not present

## 2022-11-29 DIAGNOSIS — M25561 Pain in right knee: Secondary | ICD-10-CM | POA: Insufficient documentation

## 2022-11-29 DIAGNOSIS — Z79899 Other long term (current) drug therapy: Secondary | ICD-10-CM | POA: Insufficient documentation

## 2022-11-29 DIAGNOSIS — M79604 Pain in right leg: Secondary | ICD-10-CM | POA: Diagnosis not present

## 2022-11-29 DIAGNOSIS — M5137 Other intervertebral disc degeneration, lumbosacral region: Secondary | ICD-10-CM | POA: Diagnosis not present

## 2022-11-29 MED ORDER — OXYCODONE HCL 5 MG PO TABS: 5.0000 mg | ORAL_TABLET | Freq: Three times a day (TID) | ORAL | 0 refills | Status: DC | PRN

## 2022-11-29 NOTE — Progress Notes (Signed)
Safety precautions to be maintained throughout the outpatient stay will include: orient to surroundings, keep bed in low position, maintain call bell within reach at all times, provide assistance with transfer out of bed and ambulation.   Nursing Pain Medication Assessment:  Safety precautions to be maintained throughout the outpatient stay will include: orient to surroundings, keep bed in low position, maintain call bell within reach at all times, provide assistance with transfer out of bed and ambulation.  Medication Inspection Compliance: Pill count conducted under aseptic conditions, in front of the patient. Neither the pills nor the bottle was removed from the patient's sight at any time. Once count was completed pills were immediately returned to the patient in their original bottle.  Medication: Oxycodone IR Pill/Patch Count:  6 of 90 pills remain Pill/Patch Appearance: Markings consistent with prescribed medication Bottle Appearance: Standard pharmacy container. Clearly labeled. Filled Date: 03 / 20 / 2024 Last Medication intake:  Towanda Malkin, RN

## 2022-12-01 ENCOUNTER — Encounter: Payer: Self-pay | Admitting: Family Medicine

## 2022-12-01 ENCOUNTER — Ambulatory Visit (INDEPENDENT_AMBULATORY_CARE_PROVIDER_SITE_OTHER): Payer: Medicare HMO | Admitting: Family Medicine

## 2022-12-01 VITALS — BP 124/78 | HR 70 | Temp 97.3°F | Ht 63.0 in | Wt 237.4 lb

## 2022-12-01 DIAGNOSIS — Z720 Tobacco use: Secondary | ICD-10-CM | POA: Diagnosis not present

## 2022-12-01 DIAGNOSIS — G4733 Obstructive sleep apnea (adult) (pediatric): Secondary | ICD-10-CM

## 2022-12-01 DIAGNOSIS — E039 Hypothyroidism, unspecified: Secondary | ICD-10-CM

## 2022-12-01 DIAGNOSIS — I10 Essential (primary) hypertension: Secondary | ICD-10-CM | POA: Diagnosis not present

## 2022-12-01 DIAGNOSIS — K760 Fatty (change of) liver, not elsewhere classified: Secondary | ICD-10-CM | POA: Diagnosis not present

## 2022-12-01 DIAGNOSIS — E785 Hyperlipidemia, unspecified: Secondary | ICD-10-CM | POA: Diagnosis not present

## 2022-12-01 DIAGNOSIS — E119 Type 2 diabetes mellitus without complications: Secondary | ICD-10-CM

## 2022-12-01 DIAGNOSIS — E1169 Type 2 diabetes mellitus with other specified complication: Secondary | ICD-10-CM

## 2022-12-01 LAB — CBC WITH DIFFERENTIAL/PLATELET
Basophils Absolute: 0.1 10*3/uL (ref 0.0–0.1)
Basophils Relative: 1.4 % (ref 0.0–3.0)
Eosinophils Absolute: 0.2 10*3/uL (ref 0.0–0.7)
Eosinophils Relative: 3 % (ref 0.0–5.0)
HCT: 40.9 % (ref 36.0–46.0)
Hemoglobin: 13.9 g/dL (ref 12.0–15.0)
Lymphocytes Relative: 45.5 % (ref 12.0–46.0)
Lymphs Abs: 2.9 10*3/uL (ref 0.7–4.0)
MCHC: 34 g/dL (ref 30.0–36.0)
MCV: 88.9 fl (ref 78.0–100.0)
Monocytes Absolute: 0.4 10*3/uL (ref 0.1–1.0)
Monocytes Relative: 6.6 % (ref 3.0–12.0)
Neutro Abs: 2.8 10*3/uL (ref 1.4–7.7)
Neutrophils Relative %: 43.5 % (ref 43.0–77.0)
Platelets: 179 10*3/uL (ref 150.0–400.0)
RBC: 4.6 Mil/uL (ref 3.87–5.11)
RDW: 13.7 % (ref 11.5–15.5)
WBC: 6.4 10*3/uL (ref 4.0–10.5)

## 2022-12-01 LAB — COMPREHENSIVE METABOLIC PANEL
ALT: 116 U/L — ABNORMAL HIGH (ref 0–35)
AST: 186 U/L — ABNORMAL HIGH (ref 0–37)
Albumin: 4.6 g/dL (ref 3.5–5.2)
Alkaline Phosphatase: 82 U/L (ref 39–117)
BUN: 11 mg/dL (ref 6–23)
CO2: 31 mEq/L (ref 19–32)
Calcium: 10.2 mg/dL (ref 8.4–10.5)
Chloride: 98 mEq/L (ref 96–112)
Creatinine, Ser: 0.91 mg/dL (ref 0.40–1.20)
GFR: 70.61 mL/min (ref >60.00)
Glucose, Bld: 101 mg/dL — ABNORMAL HIGH (ref 70–99)
Potassium: 3.5 mEq/L (ref 3.5–5.1)
Sodium: 140 mEq/L (ref 135–145)
Total Bilirubin: 0.5 mg/dL (ref 0.2–1.2)
Total Protein: 8 g/dL (ref 6.0–8.3)

## 2022-12-01 LAB — LIPID PANEL
Cholesterol: 158 mg/dL (ref 0–200)
HDL: 33 mg/dL — ABNORMAL LOW (ref >39.00)
NonHDL: 125.03
Total CHOL/HDL Ratio: 5
Triglycerides: 263 mg/dL — ABNORMAL HIGH (ref 0.0–149.0)
VLDL: 52.6 mg/dL — ABNORMAL HIGH (ref 0.0–40.0)

## 2022-12-01 LAB — TSH: TSH: 0.78 u[IU]/mL (ref 0.35–5.50)

## 2022-12-01 LAB — MICROALBUMIN / CREATININE URINE RATIO
Creatinine,U: 80.1 mg/dL
Microalb Creat Ratio: 1.8 mg/g (ref 0.0–30.0)
Microalb, Ur: 1.4 mg/dL (ref 0.0–1.9)

## 2022-12-01 LAB — HEMOGLOBIN A1C: Hgb A1c MFr Bld: 6.5 % (ref 4.6–6.5)

## 2022-12-01 LAB — LDL CHOLESTEROL, DIRECT: Direct LDL: 103 mg/dL

## 2022-12-01 NOTE — Assessment & Plan Note (Signed)
bp in fair control at this time  BP Readings from Last 1 Encounters:  12/01/22 124/78   No changes needed Most recent labs reviewed  Disc lifstyle change with low sodium diet and exercise  Plan to continue  amlodiine 10 mg daily  hctz 25 mg daily  Lab ordered

## 2022-12-01 NOTE — Assessment & Plan Note (Signed)
Disc goals for lipids and reasons to control them Rev last labs with pt Rev low sat fat diet in detail  No longer on statin due to elevated transaminases   On zetia and bempedoic   Lab today Followed by cardiology

## 2022-12-01 NOTE — Assessment & Plan Note (Signed)
A1c today  Metformin 500 mg bid   May want to change to XR due to some GI side eff Pend result She will schedule eye exam Microalb pending   Disc low glycemic diet and exercise goals

## 2022-12-01 NOTE — Assessment & Plan Note (Signed)
Watched by GI Enc pt to take meloxicam less- ? Only when needed Cmet ordered today

## 2022-12-01 NOTE — Assessment & Plan Note (Signed)
TSH today  No clinical changes On levothyr 112 mcg daily

## 2022-12-01 NOTE — Progress Notes (Signed)
Subjective:    Patient ID: Teresa Mcgee, female    DOB: 06/15/1966, 57 y.o.   MRN: [id number]  HPI Pt presents for f/u of chronic health problems including HTN and DM2  Wt Readings from Last 3 Encounters:  12/01/22 237 lb 6 oz (107.7 kg)  11/29/22 236 lb (107 kg)  08/23/22 236 lb (107 kg)   42.05 kg/m  Vitals:   12/01/22 1111  BP: 124/78  Pulse: 70  Temp: (!) 97.3 F (36.3 C)  SpO2: 96%   Feels ok overall  Still goes to the pain clinic   Smokes 2-3 cig per day  Not ready to quit yet - prays about it   Osa-uses cpap   HTN bp is stable today  No cp or palpitations or headaches or edema  No side effects to medicines  BP Readings from Last 3 Encounters:  12/01/22 124/78  11/29/22 138/74  08/23/22 139/65     Amlodipine 10 mg daily  Hctz 25 mg daily   Eating well  Very busy- her sister had CABG , helping her a lot   Exercise   Last metabolic panel Lab Results  Component Value Date   GLUCOSE 105 (H) 03/09/2022   NA 140 03/09/2022   K 3.7 03/09/2022   CL 100 03/09/2022   CO2 31 03/09/2022   BUN 9 03/09/2022   CREATININE 0.94 03/09/2022   GFRNONAA >60 05/25/2018   CALCIUM 9.9 03/09/2022   PROT 8.6 (H) 08/01/2022   ALBUMIN 4.3 08/01/2022   LABGLOB 3.6 01/24/2018   AGRATIO 1.2 01/24/2018   BILITOT 0.7 08/01/2022   ALKPHOS 67 08/01/2022   AST 128 (H) 08/01/2022   ALT 85 (H) 08/01/2022   ANIONGAP 9 05/25/2018    DM2 Lab Results  Component Value Date   HGBA1C 6.3 02/21/2022   Metformin 500 mg bid   Eye exam- needs to call and make an appt   Urine micro  Hyperlipidemia Lab Results  Component Value Date   CHOL 146 08/01/2022   HDL 31 (L) 08/01/2022   LDLCALC 81 08/01/2022   TRIG 169 (H) 08/01/2022   CHOLHDL 4.7 08/01/2022   Zetia 10 mg daily  Bempedoic  Sees Dr Rennis Golden  Was on qod statin but stopped for liver enzyme inc   Liver enzymes Lab Results  Component Value Date   ALT 85 (H) 08/01/2022   AST 128 (H) 08/01/2022   ALKPHOS  67 08/01/2022   BILITOT 0.7 08/01/2022   Sees GI  ? Auto immune   Takes meloxicam for years  For inflammation in neck and joints  Has disc dz    Hypothyroidism  Pt has no clinical changes No change in energy level/ hair or skin/ edema and no tremor Lab Results  Component Value Date   TSH 0.56 10/07/2021    Levothy 112 mcg daily   Last vitamin D Lab Results  Component Value Date   25OHVITD2 <1.0 01/24/2018   25OHVITD3 9.3 01/24/2018   VD25OH 68.72 08/19/2021   Patient Active Problem List   Diagnosis Date Noted   Hypomagnesemia 02/21/2022   Hypokalemia 02/21/2022   Fatty liver 02/06/2022   Allergic conjunctivitis 12/01/2021   Elevated LFTs 11/22/2021   Medication monitoring encounter 11/22/2021   Fungal dermatitis 11/22/2021   Routine general medical examination at a health care facility 08/19/2021   Encounter for screening mammogram for breast cancer 08/19/2021   Chronic use of opiate for therapeutic purpose 11/04/2020   Uncomplicated opioid dependence 11/04/2020  Ragged cuticle 09/23/2020   Type 2 diabetes mellitus without complication, without long-term current use of insulin 03/30/2019   Pain due to onychomycosis of toenails of both feet 03/03/2019   Morbid obesity with BMI of 45.0-49.9, adult 09/16/2018   Cervicalgia 04/22/2018   Chronic musculoskeletal pain 04/22/2018   DDD (degenerative disc disease), thoracic 04/22/2018   DDD (degenerative disc disease), cervical 04/22/2018   Lumbar facet arthropathy (Bilateral) 04/22/2018   Lumbar facet syndrome (Bilateral) 04/22/2018   Chronic low back pain (1ry area of Pain) (Bilateral) (L>R) w/o sciatica 04/22/2018   Spondylosis without myelopathy or radiculopathy, cervical region 04/22/2018   Spondylosis without myelopathy or radiculopathy, lumbosacral region 04/22/2018   DDD (degenerative disc disease), lumbosacral 04/22/2018   Strain of lumbar paraspinal muscle, sequela 04/22/2018   Chronic hip pain (Bilateral)  (R>L) 04/22/2018   Chronic sacroiliac joint pain (Left) 04/22/2018   Neurogenic pain 04/22/2018   Osteoarthritis involving multiple joints 04/22/2018   Chronic knee pain (Bilateral) (R>L) 04/22/2018   Chronic upper extremity pain (4th area of Pain) (Bilateral) (L>R) 02/27/2018   Vitamin D deficiency 02/27/2018   Chronic low back pain (Bilateral) (L>R) w/ sciatica (Bilateral) 01/24/2018   Chronic lower extremity pain (2ry area of Pain) (Bilateral) (L>R) 01/24/2018   Chronic neck pain (3ry area of Pain) (Bilateral) (L>R) 01/24/2018   Chronic pain syndrome 01/24/2018   Long term current use of opiate analgesic 01/24/2018   Disorder of skeletal system 01/24/2018   Osteopathic Exam Findings (Somatic Dysfunction) 04/25/2012   Constipation 01/11/2012   Chest pain 01/11/2012   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   Numbness and tingling in both hands 08/25/2011   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   Muscle spasms of neck 05/23/2011   Nasal mucositis (ulcerative) 05/23/2011   Fatigue 03/21/2011   Tobacco abuse 05/24/2010   Hypothyroidism 01/25/2010   HYPERTENSION, BENIGN ESSENTIAL 01/25/2010   Hyperlipidemia associated with type 2 diabetes mellitus 12/31/2009   DEPRESSION 12/31/2009   GERD 12/31/2009   OSA (obstructive sleep apnea) 12/31/2009   Past Medical History:  Diagnosis Date   Arthritis    Bell's palsy may 2012   Chest pain    Diabetes mellitus without complication    GERD (gastroesophageal reflux disease)    H/O hiatal hernia    Hyperlipidemia    Hypertension    Hypothyroidism    Shingles    Shortness of breath    Sleep apnea    uses cpap   Past Surgical History:  Procedure Laterality Date   ABDOMINAL HYSTERECTOMY     CESAREAN SECTION     cesarian     3x   COLONOSCOPY WITH PROPOFOL N/A 11/27/2019   Procedure: COLONOSCOPY WITH PROPOFOL;  Surgeon: Wyline Mood, MD;  Location: [facility] ENDOSCOPY;  Service: Gastroenterology;  Laterality: N/A;   ESOPHAGOGASTRODUODENOSCOPY (EGD) WITH  PROPOFOL N/A 07/20/2020   Procedure: ESOPHAGOGASTRODUODENOSCOPY (EGD) WITH PROPOFOL;  Surgeon: Wyline Mood, MD;  Location: [facility] ENDOSCOPY;  Service: Gastroenterology;  Laterality: N/A;   PARTIAL HYSTERECTOMY  2000   abnormal uterine bleeding   THYROIDECTOMY  1998   TOOTH EXTRACTION     TUBAL LIGATION     Social History   Tobacco Use   Smoking status: Every Day    Packs/day: 0.50    Years: 20.00    Additional pack years: 0.00    Total pack years: 10.00    Types: Cigarettes   Smokeless tobacco: Never   Tobacco comments:    2-3 cigs daily-09/22/2020  Vaping Use   Vaping Use: Never used  Substance Use Topics   Alcohol use: No   Drug use: No   Family History  Problem Relation Age of Onset   Heart attack Father 66       deceased   Diabetes Brother    Stroke Brother 25       twice   Stroke Sister 45   Diabetes Sister    Heart attack Sister 23   Heart attack Brother 39   Breast cancer Maternal Aunt    Allergies  Allergen Reactions   Statins     myopathy   Current Outpatient Medications on File Prior to Visit  Medication Sig Dispense Refill   Accu-Chek FastClix Lancets MISC USE UP TO 4 TIMES DAILY AS DIRECTED 102 each 1   ACCU-CHEK GUIDE test strip USE UP TO 4 TIMES DAILY AS DIRECTED 100 strip 5   albuterol (VENTOLIN HFA) 108 (90 Base) MCG/ACT inhaler INHALE 2 PUFFS INTO THE LUNGS EVERY 4 HOURS AS NEEDED FOR (COUGH, SHORTNESS OF BREATH OR WHEEZING) 8.5 each 2   amLODipine (NORVASC) 10 MG tablet TAKE 1 TABLET BY MOUTH EVERY DAY 90 tablet 1   azelastine (OPTIVAR) 0.05 % ophthalmic solution Place 1 drop into both eyes 2 (two) times daily as needed. 6 mL 3   blood glucose meter kit and supplies Dispense based on patient and insurance preference. Use up to four times daily as directed. (FOR ICD-10 E10.9, E11.9). 1 each 0   [employer] D3 125 MCG (5000 UT) capsule TAKE 1 CAPSULE BY MOUTH DAILY WITH BREAKFAST. TAKE ALONG WITH CALCIUM AND MAGNESIUM.  5   ezetimibe (ZETIA) 10 MG tablet TAKE  1 TABLET BY MOUTH EVERY DAY 90 tablet 1   fluticasone (FLONASE) 50 MCG/ACT nasal spray SPRAY 2 SPRAYS INTO EACH NOSTRIL EVERY DAY 48 mL 3   gabapentin (NEURONTIN) 100 MG capsule TAKE 1 CAPSULE (100 MG TOTAL) BY MOUTH 2 (TWO) TIMES DAILY AND 3 CAPSULES (300 MG TOTAL) AT BEDTIME. 150 capsule 1   hydrochlorothiazide (HYDRODIURIL) 25 MG tablet TAKE 1 TABLET (25 MG TOTAL) BY MOUTH DAILY. AS DIRECTED 90 tablet 1   levocetirizine (XYZAL) 5 MG tablet TAKE 1 TABLET BY MOUTH EVERY DAY IN THE EVENING 90 tablet 1   levothyroxine (SYNTHROID) 112 MCG tablet TAKE 1 TABLET BY MOUTH EVERY DAY 90 tablet 0   Magnesium Oxide -Mg Supplement 500 MG CAPS TAKE 1 CAPSULE (500 MG TOTAL) BY MOUTH 2 (TWO) TIMES DAILY AT 8 AM AND 10 PM. 180 capsule 0   meloxicam (MOBIC) 15 MG tablet TAKE 1 TABLET BY MOUTH EVERY DAY WITH FOOD AS NEEDED FOR PAIN 90 tablet 1   metFORMIN (GLUCOPHAGE) 500 MG tablet Take 1 tablet (500 mg total) by mouth 2 (two) times daily with a meal. 180 tablet 3   naloxone (NARCAN) nasal spray 4 mg/0.1 mL Place 1 spray into the nose as needed for up to 365 doses (for opioid-induced respiratory depresssion). In case of emergency (overdose), spray once into each nostril. If no response within 3 minutes, repeat application and call 911. 1 each 0   NEXLETOL 180 MG TABS TAKE 1 TABLET BY MOUTH EVERY DAY 90 tablet 3   oxyCODONE (OXY IR/ROXICODONE) 5 MG immediate release tablet Take 1 tablet (5 mg total) by mouth every 8 (eight) hours as needed for severe pain. Must last 30 days 90 tablet 0   [START ON 12/31/2022] oxyCODONE (OXY IR/ROXICODONE) 5 MG immediate release tablet Take 1 tablet (5 mg total) by mouth every 8 (eight) hours as  needed for severe pain. Must last 30 days 90 tablet 0   [START ON 01/30/2023] oxyCODONE (OXY IR/ROXICODONE) 5 MG immediate release tablet Take 1 tablet (5 mg total) by mouth every 8 (eight) hours as needed for severe pain. Must last 30 days 90 tablet 0   potassium chloride (KLOR-CON 10) 10 MEQ  tablet Take 1 tablet (10 mEq total) by mouth daily. 30 tablet 11   REFRESH CELLUVISC 1 % GEL APPLY 1 DROP TO EYE 3 (THREE) TIMES DAILY.  12   tiZANidine (ZANAFLEX) 4 MG tablet TAKE 1 TABLET (4 MG TOTAL) BY MOUTH EVERY 8 (EIGHT) HOURS AS NEEDED FOR MUSCLE SPASMS 270 tablet 1   Olopatadine HCl 0.2 % SOLN INSTILL 1 DROP INTO BOTH EYES EVERY DAY AS NEEDED     oxyCODONE (OXY IR/ROXICODONE) 5 MG immediate release tablet Take 1 tablet (5 mg total) by mouth every 8 (eight) hours as needed for severe pain. Must last 30 days 90 tablet 0   No current facility-administered medications on file prior to visit.    Review of Systems  Constitutional:  Positive for fatigue. Negative for activity change, appetite change, fever and unexpected weight change.       Imporved fatigue with cpap  HENT:  Negative for congestion, ear pain, rhinorrhea, sinus pressure and sore throat.   Eyes:  Negative for pain, redness and visual disturbance.  Respiratory:  Negative for cough, shortness of breath and wheezing.   Cardiovascular:  Negative for chest pain and palpitations.  Gastrointestinal:  Negative for abdominal pain, blood in stool, constipation and diarrhea.  Endocrine: Negative for polydipsia and polyuria.  Genitourinary:  Negative for dysuria, frequency and urgency.  Musculoskeletal:  Negative for arthralgias, back pain and myalgias.  Skin:  Negative for pallor and rash.  Allergic/Immunologic: Negative for environmental allergies.  Neurological:  Negative for dizziness, syncope and headaches.  Hematological:  Negative for adenopathy. Does not bruise/bleed easily.  Psychiatric/Behavioral:  Negative for decreased concentration and dysphoric mood. The patient is not nervous/anxious.        Objective:   Physical Exam Constitutional:      General: She is not in acute distress.    Appearance: Normal appearance. She is well-developed. She is obese. She is not ill-appearing or diaphoretic.  HENT:     Head:  Normocephalic and atraumatic.  Eyes:     Conjunctiva/sclera: Conjunctivae normal.     Pupils: Pupils are equal, round, and reactive to light.  Neck:     Thyroid: No thyromegaly.     Vascular: No carotid bruit or JVD.  Cardiovascular:     Rate and Rhythm: Normal rate and regular rhythm.     Heart sounds: Normal heart sounds.     No gallop.  Pulmonary:     Effort: Pulmonary effort is normal. No respiratory distress.     Breath sounds: Normal breath sounds. No stridor. No wheezing, rhonchi or rales.     Comments: Bs are slightly distant  Abdominal:     General: There is no distension or abdominal bruit.     Palpations: Abdomen is soft.  Musculoskeletal:     Cervical back: Normal range of motion and neck supple.     Right lower leg: No edema.     Left lower leg: No edema.  Lymphadenopathy:     Cervical: No cervical adenopathy.  Skin:    General: Skin is warm and dry.     Coloration: Skin is not pale.     Findings: No rash.  Neurological:     Mental Status: She is alert.     Sensory: No sensory deficit.     Coordination: Coordination normal.     Deep Tendon Reflexes: Reflexes are normal and symmetric. Reflexes normal.  Psychiatric:        Mood and Affect: Mood normal.           Assessment & Plan:   Problem List Items Addressed This Visit       Cardiovascular and Mediastinum   HYPERTENSION, BENIGN ESSENTIAL    bp in fair control at this time  BP Readings from Last 1 Encounters:  12/01/22 124/78  No changes needed Most recent labs reviewed  Disc lifstyle change with low sodium diet and exercise  Plan to continue  amlodiine 10 mg daily  hctz 25 mg daily  Lab ordered      Relevant Orders   TSH   Lipid panel   Comprehensive metabolic panel   CBC with Differential/Platelet     Respiratory   OSA (obstructive sleep apnea)    Per pt doing well with cpap        Digestive   Fatty liver    Watched by GI Enc pt to take meloxicam less- ? Only when needed Cmet  ordered today      Relevant Orders   Comprehensive metabolic panel     Endocrine   Hyperlipidemia associated with type 2 diabetes mellitus    Disc goals for lipids and reasons to control them Rev last labs with pt Rev low sat fat diet in detail  No longer on statin due to elevated transaminases   On zetia and bempedoic   Lab today Followed by cardiology      Relevant Orders   Lipid panel   Comprehensive metabolic panel   Hypothyroidism    TSH today  No clinical changes On levothyr 112 mcg daily       Relevant Orders   TSH   Type 2 diabetes mellitus without complication, without long-term current use of insulin - Primary    A1c today  Metformin 500 mg bid   May want to change to XR due to some GI side eff Pend result She will schedule eye exam Microalb pending   Disc low glycemic diet and exercise goals       Relevant Orders   Hemoglobin A1c   Microalbumin / creatinine urine ratio     Other   Tobacco abuse    Disc in detail risks of smoking and possible outcomes including copd, vascular/ heart disease, cancer , respiratory and sinus infections  Pt voices understanding Pt smokes 2-3 cig per day  States not ready to quit

## 2022-12-01 NOTE — Assessment & Plan Note (Signed)
Disc in detail risks of smoking and possible outcomes including copd, vascular/ heart disease, cancer , respiratory and sinus infections  Pt voices understanding Pt smokes 2-3 cig per day  States not ready to quit

## 2022-12-01 NOTE — Assessment & Plan Note (Signed)
Per pt doing well with cpap

## 2022-12-01 NOTE — Patient Instructions (Addendum)
If you can cut back the meloxicam or eventually stop it that would be good   An alternative is topical voltaren gel 1% over the counter   Once labs return- I may change your metformin to the XR version - it is less likely to cause diarrhea    Call and schedule your eye exam Have them send [place] a copy   Labs today   Try and eat a low glycemic diet  Try to get most of your carbohydrates from produce (with the exception of white potatoes)  Eat less bread/pasta/rice/snack foods/cereals/sweets and other items from the middle of the grocery store (processed carbs)  Add exercise when you can

## 2022-12-03 ENCOUNTER — Telehealth: Payer: Self-pay | Admitting: Family Medicine

## 2022-12-03 MED ORDER — METFORMIN HCL ER 500 MG PO TB24: 500.0000 mg | ORAL_TABLET | Freq: Two times a day (BID) | ORAL | 2 refills | Status: DC

## 2022-12-03 NOTE — Telephone Encounter (Signed)
I will go ahead and change her metformin to the XR formula to see if it gives her less GI side effects   Let me know if this helps

## 2022-12-04 NOTE — Progress Notes (Signed)
We had suggested a liver biopsy for her and she was not interested , in fact she no showed for her last appointment , if she is willing to proceed or discuss about it will be happy to see her .  Kind Regards  Charina Fons

## 2022-12-04 NOTE — Telephone Encounter (Signed)
Pt.notified

## 2022-12-05 ENCOUNTER — Telehealth: Payer: Self-pay

## 2022-12-05 DIAGNOSIS — R7989 Other specified abnormal findings of blood chemistry: Secondary | ICD-10-CM

## 2022-12-05 NOTE — Telephone Encounter (Signed)
Patient agreed on doing the liver biopsy. I will go ahead and schedule it and then I will call her back with instructions.

## 2022-12-05 NOTE — Telephone Encounter (Signed)
Patient called stating that she had her ALT checked and it was:  ALT 0 - 35 U/L 116 High   Therefore patient stated that she had a follow up with her GI provider.  Patient wanted to know when she could be seen. I told her that I would be calling her once Dr. Tobi Bastos tells me when and where Io place her.Please advise on how soon you would like to see her. Thank you.

## 2022-12-05 NOTE — Telephone Encounter (Signed)
Called Marylu Lund (Specialty scheduling) to let her know that I was faxing their form for liver biopsy and the order thru epic. She will contact the patient to schedule.

## 2022-12-13 ENCOUNTER — Telehealth: Payer: Self-pay

## 2022-12-13 NOTE — Telephone Encounter (Signed)
Pt calling because she hadn't heard about her liver biopsy appointment. I called [facility] and had to leave a message.

## 2022-12-18 ENCOUNTER — Ambulatory Visit: Payer: Medicare HMO | Admitting: Podiatry

## 2022-12-20 NOTE — Telephone Encounter (Signed)
Called Mrs. Teresa Mcgee from specialty procedures and had to leave her a detailed message letting her know that patient had not heard from them to schedule her liver biopsy since her paperwork was faxed 2 weeks ago. Hope to hear from Mrs. Teresa Mcgee soon.

## 2022-12-21 NOTE — Telephone Encounter (Addendum)
Mrs. Marylu Lund from IR called me back to let me know that she was finally able to schedule patient's liver biopsy for 12/28/2022 and that the patient was aware.

## 2022-12-28 ENCOUNTER — Ambulatory Visit: Payer: Medicare HMO

## 2022-12-28 ENCOUNTER — Other Ambulatory Visit: Payer: Self-pay | Admitting: Student

## 2022-12-28 DIAGNOSIS — R7989 Other specified abnormal findings of blood chemistry: Secondary | ICD-10-CM

## 2022-12-28 DIAGNOSIS — E119 Type 2 diabetes mellitus without complications: Secondary | ICD-10-CM

## 2022-12-28 NOTE — H&P (Signed)
Chief Complaint: Patient was seen in consultation today for abnormal LFTs; non-focal liver biopsy   Referring Physician(s): Anna,Kiran  Supervising Physician: Gilmer Mor  Patient Status: [facility] - Out-pt  History of Present Illness: Teresa Mcgee is a 57 y.o. female with a medical history significant for Bell's palsy, HTN and sleep apnea. She was initially referred to GI in 2021 for dysphagia and gastritis. She underwent EGD and RUQ ultrasound which gastritis and hepatic steatosis, respectively. Her LFTs have been elevated for the past several years and have been gradually increasing. Additional lab work was positive for smooth muscle antibody and elevated IgG/IgE.   Interventional Radiology has been asked to evaluate this patient for an image-guided non-focal liver biopsy for further work up.   Past Medical History:  Diagnosis Date   Arthritis    Bell's palsy may 2012   Chest pain    Diabetes mellitus without complication (HCC)    GERD (gastroesophageal reflux disease)    H/O hiatal hernia    Hyperlipidemia    Hypertension    Hypothyroidism    Shingles    Shortness of breath    Sleep apnea    uses cpap    Past Surgical History:  Procedure Laterality Date   ABDOMINAL HYSTERECTOMY     CESAREAN SECTION     cesarian     3x   COLONOSCOPY WITH PROPOFOL N/A 11/27/2019   Procedure: COLONOSCOPY WITH PROPOFOL;  Surgeon: Wyline Mood, MD;  Location: [facility] ENDOSCOPY;  Service: Gastroenterology;  Laterality: N/A;   ESOPHAGOGASTRODUODENOSCOPY (EGD) WITH PROPOFOL N/A 07/20/2020   Procedure: ESOPHAGOGASTRODUODENOSCOPY (EGD) WITH PROPOFOL;  Surgeon: Wyline Mood, MD;  Location: [facility] ENDOSCOPY;  Service: Gastroenterology;  Laterality: N/A;   PARTIAL HYSTERECTOMY  2000   abnormal uterine bleeding   THYROIDECTOMY  1998   TOOTH EXTRACTION     TUBAL LIGATION      Allergies: Statins  Medications: Prior to Admission medications   Medication Sig Start Date End Date Taking?  Authorizing Provider  Accu-Chek FastClix Lancets MISC USE UP TO 4 TIMES DAILY AS DIRECTED 04/08/19   Emi Belfast, FNP  ACCU-CHEK GUIDE test strip USE UP TO 4 TIMES DAILY AS DIRECTED 05/01/19   Emi Belfast, FNP  albuterol (VENTOLIN HFA) 108 (90 Base) MCG/ACT inhaler INHALE 2 PUFFS INTO THE LUNGS EVERY 4 HOURS AS NEEDED FOR (COUGH, SHORTNESS OF BREATH OR WHEEZING) 03/20/22   Tower, Audrie Gallus, MD  amLODipine (NORVASC) 10 MG tablet TAKE 1 TABLET BY MOUTH EVERY DAY 08/15/22   Tower, Audrie Gallus, MD  azelastine (OPTIVAR) 0.05 % ophthalmic solution Place 1 drop into both eyes 2 (two) times daily as needed. 12/01/21   Tower, Audrie Gallus, MD  blood glucose meter kit and supplies Dispense based on patient and insurance preference. Use up to four times daily as directed. (FOR ICD-10 E10.9, E11.9). 03/07/19   Emi Belfast, FNP  [employer] D3 125 MCG (5000 UT) capsule TAKE 1 CAPSULE BY MOUTH DAILY WITH BREAKFAST. TAKE ALONG WITH CALCIUM AND MAGNESIUM. 05/25/18   [provider]  ezetimibe (ZETIA) 10 MG tablet TAKE 1 TABLET BY MOUTH EVERY DAY 08/15/22   Tower, Audrie Gallus, MD  fluticasone (FLONASE) 50 MCG/ACT nasal spray SPRAY 2 SPRAYS INTO EACH NOSTRIL EVERY DAY 01/23/22   Tower, Audrie Gallus, MD  gabapentin (NEURONTIN) 100 MG capsule TAKE 1 CAPSULE (100 MG TOTAL) BY MOUTH 2 (TWO) TIMES DAILY AND 3 CAPSULES (300 MG TOTAL) AT BEDTIME. 09/07/22   Tower, Audrie Gallus, MD  hydrochlorothiazide (  HYDRODIURIL) 25 MG tablet TAKE 1 TABLET (25 MG TOTAL) BY MOUTH DAILY. AS DIRECTED 10/30/22   Tower, Audrie Gallus, MD  levocetirizine (XYZAL) 5 MG tablet TAKE 1 TABLET BY MOUTH EVERY DAY IN THE EVENING 08/15/22   Tower, Audrie Gallus, MD  levothyroxine (SYNTHROID) 112 MCG tablet TAKE 1 TABLET BY MOUTH EVERY DAY 10/31/22   Tower, Audrie Gallus, MD  Magnesium Oxide -Mg Supplement 500 MG CAPS TAKE 1 CAPSULE (500 MG TOTAL) BY MOUTH 2 (TWO) TIMES DAILY AT 8 AM AND 10 PM. 10/31/22   Tower, Audrie Gallus, MD  meloxicam (MOBIC) 15 MG tablet TAKE 1 TABLET BY MOUTH EVERY DAY WITH  FOOD AS NEEDED FOR PAIN 09/28/22   Tower, Audrie Gallus, MD  metFORMIN (GLUCOPHAGE-XR) 500 MG 24 hr tablet Take 1 tablet (500 mg total) by mouth 2 (two) times daily with a meal. 12/03/22   Tower, Audrie Gallus, MD  naloxone [facility]) nasal spray 4 mg/0.1 mL Place 1 spray into the nose as needed for up to 365 doses (for opioid-induced respiratory depresssion). In case of emergency (overdose), spray once into each nostril. If no response within 3 minutes, repeat application and call 911. 05/29/22 05/29/23  Delano Metz, MD  NEXLETOL 180 MG TABS TAKE 1 TABLET BY MOUTH EVERY DAY 10/31/22   Hilty, Lisette Abu, MD  Olopatadine HCl 0.2 % SOLN INSTILL 1 DROP INTO BOTH EYES EVERY DAY AS NEEDED 03/31/19   [provider]  oxyCODONE (OXY IR/ROXICODONE) 5 MG immediate release tablet Take 1 tablet (5 mg total) by mouth every 8 (eight) hours as needed for severe pain. Must last 30 days 11/01/22 12/01/22  Delano Metz, MD  oxyCODONE (OXY IR/ROXICODONE) 5 MG immediate release tablet Take 1 tablet (5 mg total) by mouth every 8 (eight) hours as needed for severe pain. Must last 30 days 12/01/22 12/31/22  Delano Metz, MD  oxyCODONE (OXY IR/ROXICODONE) 5 MG immediate release tablet Take 1 tablet (5 mg total) by mouth every 8 (eight) hours as needed for severe pain. Must last 30 days 12/31/22 01/30/23  Delano Metz, MD  oxyCODONE (OXY IR/ROXICODONE) 5 MG immediate release tablet Take 1 tablet (5 mg total) by mouth every 8 (eight) hours as needed for severe pain. Must last 30 days 01/30/23 03/01/23  Delano Metz, MD  potassium chloride (KLOR-CON 10) 10 MEQ tablet Take 1 tablet (10 mEq total) by mouth daily. 02/22/22   Tower, Audrie Gallus, MD  REFRESH CELLUVISC 1 % GEL APPLY 1 DROP TO EYE 3 (THREE) TIMES DAILY. 11/16/17   [provider]  tiZANidine (ZANAFLEX) 4 MG tablet TAKE 1 TABLET (4 MG TOTAL) BY MOUTH EVERY 8 (EIGHT) HOURS AS NEEDED FOR MUSCLE SPASMS 11/27/22 05/26/23  Tower, Audrie Gallus, MD     Family History   Problem Relation Age of Onset   Heart attack Father 45       deceased   Diabetes Brother    Stroke Brother 25       twice   Stroke Sister 45   Diabetes Sister    Heart attack Sister 2   Heart attack Brother 39   Breast cancer Maternal Aunt     Social History   Socioeconomic History   Marital status: Married    Spouse name: Not on file   Number of children: Not on file   Years of education: Not on file   Highest education level: Not on file  Occupational History   Occupation: disabled    Employer: [employer]  Comment: back  Tobacco Use   Smoking status: Every Day    Packs/day: 0.50    Years: 20.00    Additional pack years: 0.00    Total pack years: 10.00    Types: Cigarettes   Smokeless tobacco: Never   Tobacco comments:    2-3 cigs daily-09/22/2020  Vaping Use   Vaping Use: Never used  Substance and Sexual Activity   Alcohol use: No   Drug use: No   Sexual activity: Not Currently  Other Topics Concern   Not on file  Social History Narrative   Married but she and husband separated, she helps take care of him (he is in poor health)   She lives with her grown son who is mentally disabled   She has been on disability since 2011   Social Determinants of Health   Financial Resource Strain: Low Risk  (12/08/2020)   Overall Financial Resource Strain (CARDIA)    Difficulty of Paying Living Expenses: Not hard at all  Food Insecurity: No Food Insecurity (12/12/2021)   Hunger Vital Sign    Worried About Running Out of Food in the Last Year: Never true    Ran Out of Food in the Last Year: Never true  Transportation Needs: No Transportation Needs (12/12/2021)   PRAPARE - Administrator, Civil Service (Medical): No    Lack of Transportation (Non-Medical): No  Physical Activity: Insufficiently Active (12/12/2021)   Exercise Vital Sign    Days of Exercise per Week: 3 days    Minutes of Exercise per Session: 30 min  Stress: No Stress Concern Present (12/12/2021)    [employer] of Occupational Health - Occupational Stress Questionnaire    Feeling of Stress : Only a little  Social Connections: Moderately Isolated (12/12/2021)   Social Connection and Isolation Panel [NHANES]    Frequency of Communication with Friends and Family: More than three times a week    Frequency of Social Gatherings with Friends and Family: Twice a week    Attends Religious Services: More than 4 times per year    Active Member of [employer] or Organizations: No    Attends Banker Meetings: Never    Marital Status: Separated    Review of Systems: A 12 point ROS discussed and pertinent positives are indicated in the HPI above.  All other systems are negative.  Review of Systems  Constitutional:  Negative for appetite change and fatigue.  Respiratory:  Negative for cough and shortness of breath.   Gastrointestinal:  Negative for abdominal pain, diarrhea, nausea and vomiting.  Genitourinary:  Negative for flank pain.  Musculoskeletal:  Negative for back pain.  Neurological:  Negative for dizziness and headaches.    Vital Signs: BP 134/85   Pulse 63   Temp 97.7 F (36.5 C) (Oral)   Resp 15   Ht 5\' 3"  (1.6 m)   Wt 236 lb (107 kg)   LMP  (LMP Unknown)   SpO2 98%   BMI 41.81 kg/m   Physical Exam Constitutional:      General: She is not in acute distress.    Appearance: She is not ill-appearing.  HENT:     Mouth/Throat:     Mouth: Mucous membranes are moist.     Pharynx: Oropharynx is clear.  Cardiovascular:     Rate and Rhythm: Normal rate and regular rhythm.     Pulses: Normal pulses.     Heart sounds: Normal heart sounds.  Pulmonary:  Effort: Pulmonary effort is normal.     Breath sounds: Normal breath sounds.  Abdominal:     General: Bowel sounds are normal.     Palpations: Abdomen is soft.     Tenderness: There is no abdominal tenderness.  Musculoskeletal:     Right lower leg: No edema.     Left lower leg: No edema.  Skin:     General: Skin is warm and dry.  Neurological:     Mental Status: She is alert and oriented to person, place, and time.  Psychiatric:        Mood and Affect: Mood normal.        Behavior: Behavior normal.        Thought Content: Thought content normal.        Judgment: Judgment normal.     Imaging: No results found.  Labs:  CBC: Recent Labs    12/01/22 1150 12/29/22 0845  WBC 6.4 6.4  HGB 13.9 13.9  HCT 40.9 42.1  PLT 179.0 187    COAGS: Recent Labs    12/29/22 0845  INR 1.0    BMP: Recent Labs    02/21/22 1203 03/09/22 1032 12/01/22 1150  NA 141 140 140  K 3.4* 3.7 3.5  CL 99 100 98  CO2 31 31 31   GLUCOSE 99 105* 101*  BUN 11 9 11   CALCIUM 10.5 9.9 10.2  CREATININE 0.93 0.94 0.91    LIVER FUNCTION TESTS: Recent Labs    02/21/22 1203 04/24/22 1443 08/01/22 1234 12/01/22 1150  BILITOT 0.4 0.6 0.7 0.5  AST 112* 133* 128* 186*  ALT 73* 86* 85* 116*  ALKPHOS 76 72 67 82  PROT 7.9 8.6* 8.6* 8.0  ALBUMIN 4.7 4.4 4.3 4.6    TUMOR MARKERS: No results for input(s): "AFPTM", "CEA", "CA199", "CHROMGRNA" in the last 8760 hours.  Assessment and Plan:  Elevated LFTs with positive smooth muscle antibody and elevated IgG/IgE: Teresa Mcgee, 57 year old female, presents today to the [facility] Interventional Radiology department for an image-guided non-focal liver biopsy.   Risks and benefits of this procedure were discussed with the patient and/or patient's family including, but not limited to bleeding, infection, damage to adjacent structures or low yield requiring additional tests.  All of the questions were answered and there is agreement to proceed. She has been NPO. She does not take any blood-thinning medications. She is a full code.   Consent signed and in chart.  Thank you for this interesting consult.  I greatly enjoyed meeting Teresa Mcgee and look forward to participating in their care.  A copy of this report was  sent to the requesting provider on this date.  Electronically Signed: Alwyn Ren, AGACNP-BC [phone] 12/29/2022, 9:09 AM   I spent a total of  30 Minutes   in face to face in clinical consultation, greater than 50% of which was counseling/coordinating care for non-focal liver biopsy.

## 2022-12-28 NOTE — Progress Notes (Signed)
Patient for US guided Liver Biopsy on Friday 12/29/2022, I called and spoke with the patient on the phone and gave pre-procedure instructions. Pt was made aware to be here at 8:30a, NPO after MN prior to procedure as well as driver post procedure/recovery/discharge. Pt stated understanding.  Called 12/25/2022

## 2022-12-29 ENCOUNTER — Ambulatory Visit
Admission: RE | Admit: 2022-12-29 | Discharge: 2022-12-29 | Disposition: A | Discharge status: Home / Self Care | Payer: Medicare HMO | Source: Ambulatory Visit | Attending: Gastroenterology | Admitting: Gastroenterology

## 2022-12-29 ENCOUNTER — Other Ambulatory Visit: Payer: Self-pay

## 2022-12-29 DIAGNOSIS — K769 Liver disease, unspecified: Secondary | ICD-10-CM | POA: Diagnosis not present

## 2022-12-29 DIAGNOSIS — R7989 Other specified abnormal findings of blood chemistry: Secondary | ICD-10-CM

## 2022-12-29 DIAGNOSIS — I10 Essential (primary) hypertension: Secondary | ICD-10-CM | POA: Diagnosis not present

## 2022-12-29 DIAGNOSIS — G4730 Sleep apnea, unspecified: Secondary | ICD-10-CM | POA: Insufficient documentation

## 2022-12-29 DIAGNOSIS — K7581 Nonalcoholic steatohepatitis (NASH): Secondary | ICD-10-CM | POA: Insufficient documentation

## 2022-12-29 DIAGNOSIS — R945 Abnormal results of liver function studies: Secondary | ICD-10-CM | POA: Insufficient documentation

## 2022-12-29 DIAGNOSIS — E119 Type 2 diabetes mellitus without complications: Secondary | ICD-10-CM

## 2022-12-29 LAB — COMPREHENSIVE METABOLIC PANEL
ALT: 112 U/L — ABNORMAL HIGH (ref 0–44)
AST: 169 U/L — ABNORMAL HIGH (ref 15–41)
Albumin: 4.3 g/dL (ref 3.5–5.0)
Alkaline Phosphatase: 77 U/L (ref 38–126)
Anion gap: 9 (ref 5–15)
BUN: 12 mg/dL (ref 6–20)
CO2: 29 mmol/L (ref 22–32)
Calcium: 9.7 mg/dL (ref 8.9–10.3)
Chloride: 102 mmol/L (ref 98–111)
Creatinine, Ser: 0.89 mg/dL (ref 0.44–1.00)
GFR, Estimated: >60 mL/min (ref >60)
Glucose, Bld: 128 mg/dL — ABNORMAL HIGH (ref 70–99)
Potassium: 3.4 mmol/L — ABNORMAL LOW (ref 3.5–5.1)
Sodium: 140 mmol/L (ref 135–145)
Total Bilirubin: 0.7 mg/dL (ref 0.3–1.2)
Total Protein: 8.3 g/dL — ABNORMAL HIGH (ref 6.5–8.1)

## 2022-12-29 LAB — GLUCOSE, CAPILLARY: Glucose-Capillary: 121 mg/dL — ABNORMAL HIGH (ref 70–99)

## 2022-12-29 LAB — CBC
HCT: 42.1 % (ref 36.0–46.0)
Hemoglobin: 13.9 g/dL (ref 12.0–15.0)
MCH: 29.5 pg (ref 26.0–34.0)
MCHC: 33 g/dL (ref 30.0–36.0)
MCV: 89.4 fL (ref 80.0–100.0)
Platelets: 187 10*3/uL (ref 150–400)
RBC: 4.71 MIL/uL (ref 3.87–5.11)
RDW: 13 % (ref 11.5–15.5)
WBC: 6.4 10*3/uL (ref 4.0–10.5)
nRBC: 0 % (ref 0.0–0.2)

## 2022-12-29 LAB — PROTIME-INR
INR: 1 (ref 0.8–1.2)
Prothrombin Time: 13.9 seconds (ref 11.4–15.2)

## 2022-12-29 MED ORDER — SODIUM CHLORIDE 0.9 % IV SOLN: INTRAVENOUS | Status: DC

## 2022-12-29 MED ORDER — LIDOCAINE HCL (PF) 1 % IJ SOLN
10.0000 mL | Freq: Once | INTRAMUSCULAR | Status: AC
  Administered 2022-12-29: 10 mL via INTRADERMAL
  Filled 2022-12-29: qty 10

## 2022-12-29 MED ORDER — FENTANYL CITRATE (PF) 100 MCG/2ML IJ SOLN
INTRAMUSCULAR | Status: AC
  Filled 2022-12-29: qty 2

## 2022-12-29 MED ORDER — FENTANYL CITRATE (PF) 100 MCG/2ML IJ SOLN
INTRAMUSCULAR | Status: AC | PRN
  Administered 2022-12-29: 50 ug via INTRAVENOUS

## 2022-12-29 MED ORDER — MIDAZOLAM HCL 2 MG/2ML IJ SOLN
INTRAMUSCULAR | Status: AC | PRN
  Administered 2022-12-29: 1 mg via INTRAVENOUS

## 2022-12-29 MED ORDER — MIDAZOLAM HCL 2 MG/2ML IJ SOLN
INTRAMUSCULAR | Status: AC
  Filled 2022-12-29: qty 2

## 2023-01-03 ENCOUNTER — Other Ambulatory Visit: Payer: Self-pay | Admitting: Family Medicine

## 2023-01-03 DIAGNOSIS — M792 Neuralgia and neuritis, unspecified: Secondary | ICD-10-CM

## 2023-01-03 LAB — SURGICAL PATHOLOGY

## 2023-01-03 NOTE — Telephone Encounter (Signed)
Last filled on 09/07/22 #150 caps with 1 refill, last OV 12/01/22

## 2023-01-05 ENCOUNTER — Telehealth: Payer: Self-pay | Admitting: Gastroenterology

## 2023-01-05 DIAGNOSIS — K760 Fatty (change of) liver, not elsewhere classified: Secondary | ICD-10-CM

## 2023-01-05 NOTE — Telephone Encounter (Signed)
Pt left message to get a call back to get results

## 2023-01-06 IMAGING — MG MM DIGITAL SCREENING BILAT W/ TOMO AND CAD
6 of 12 series · 6 of 36 positions shown · non-contrast
Comparison: Previous exam(s).

ACR Breast Density Category a: The breast tissue is almost entirely
fatty.

CLINICAL DATA: Screening.

EXAM:
DIGITAL SCREENING BILATERAL MAMMOGRAM WITH TOMOSYNTHESIS AND CAD
TECHNIQUE: Bilateral screening digital craniocaudal and mediolateral oblique
mammograms were obtained. Bilateral screening digital breast
tomosynthesis was performed. The images were evaluated with
computer-aided detection.

[R CC synth-2D]
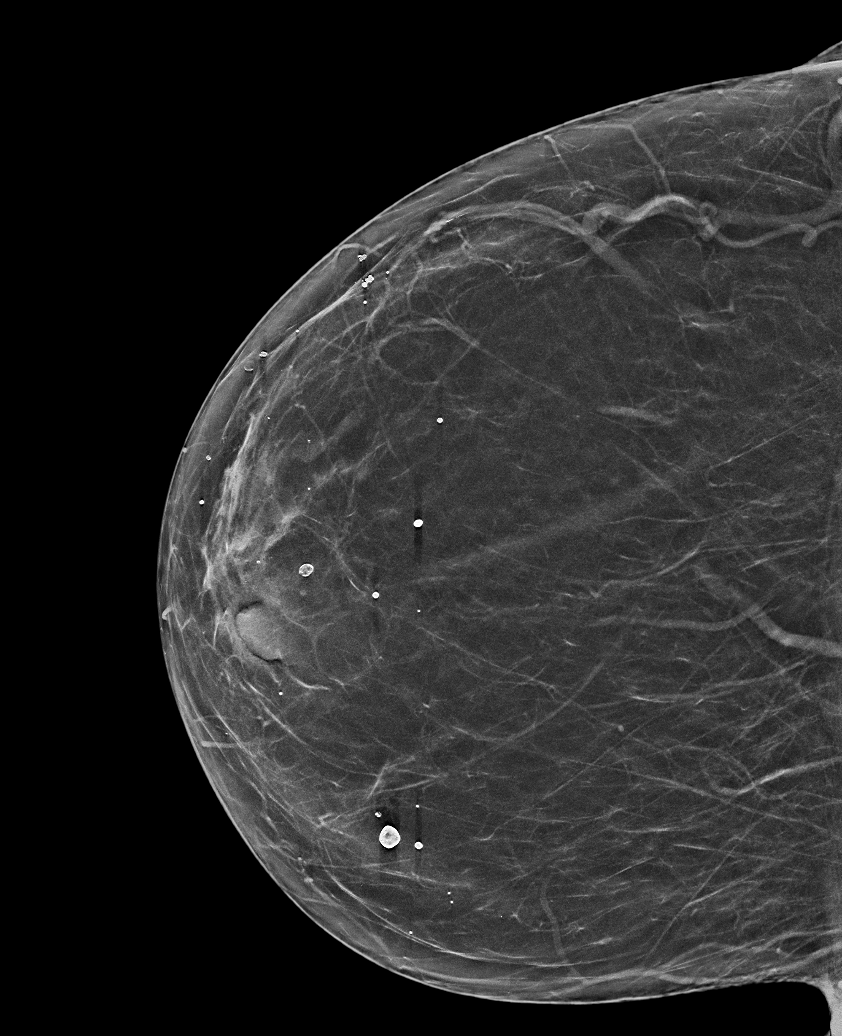

[R MLO synth-2D (1 of 2)]
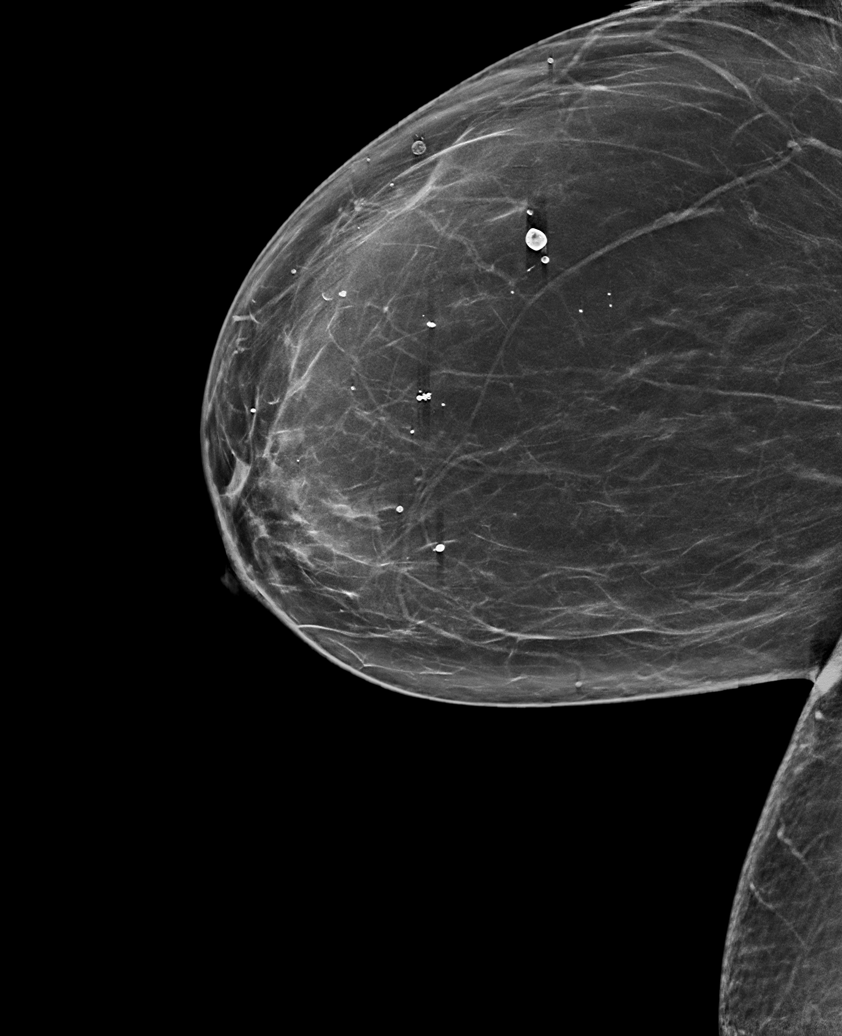

[L MLO synth-2D (1 of 2)]
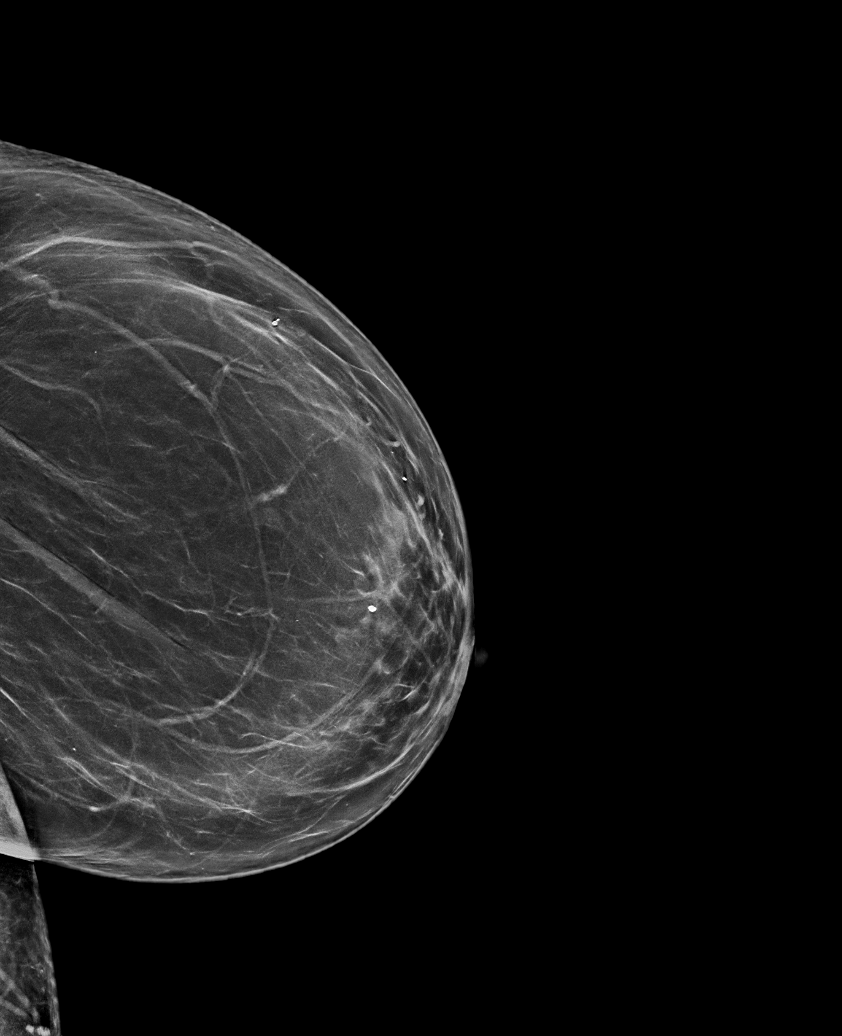

[L MLO synth-2D (2 of 2)]
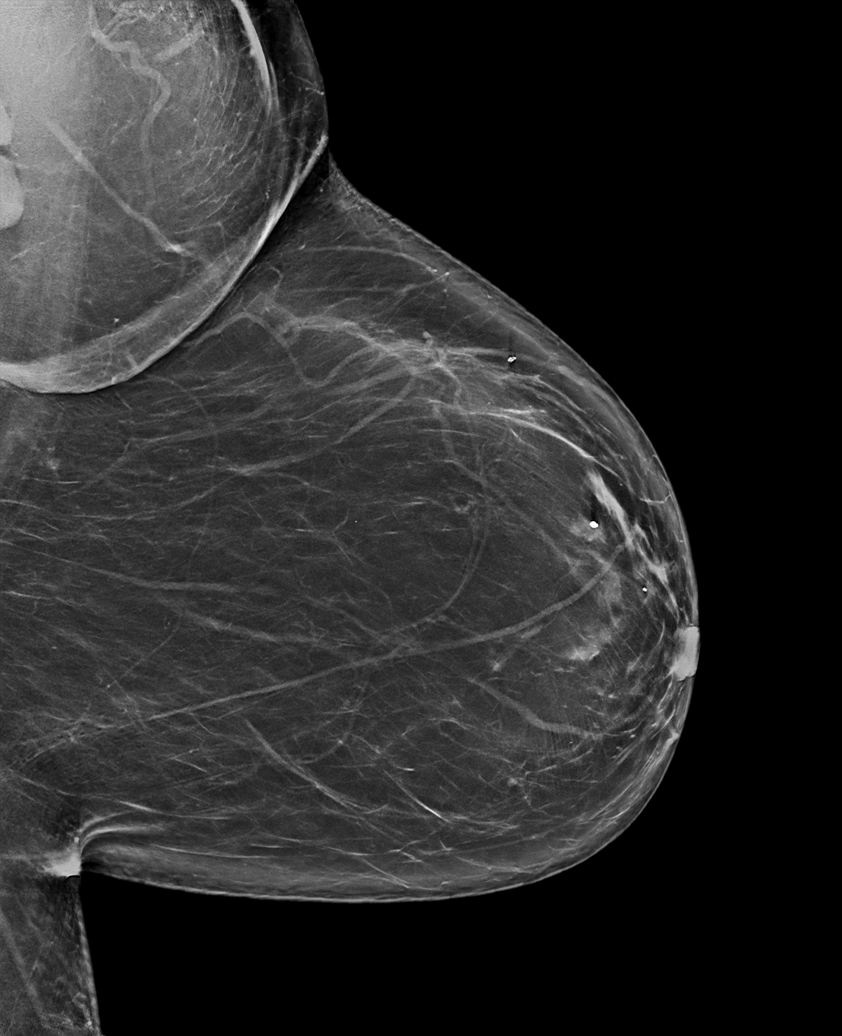

[R MLO synth-2D (2 of 2)]
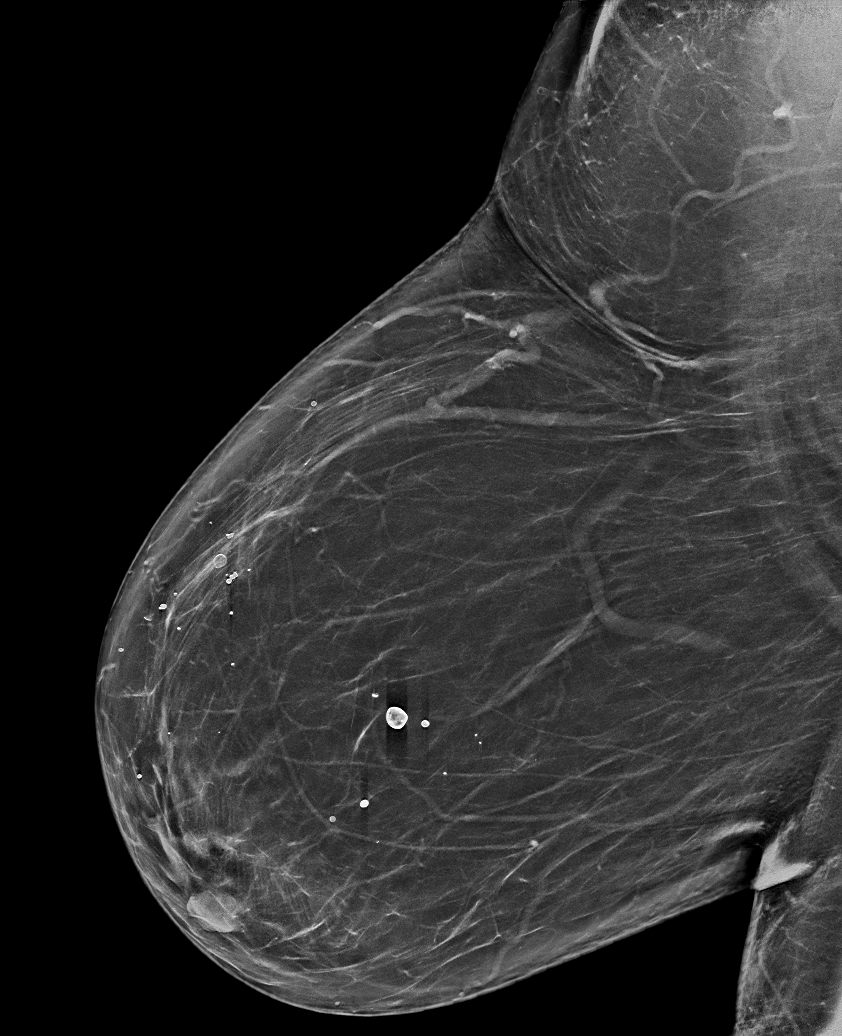

[L CC synth-2D]
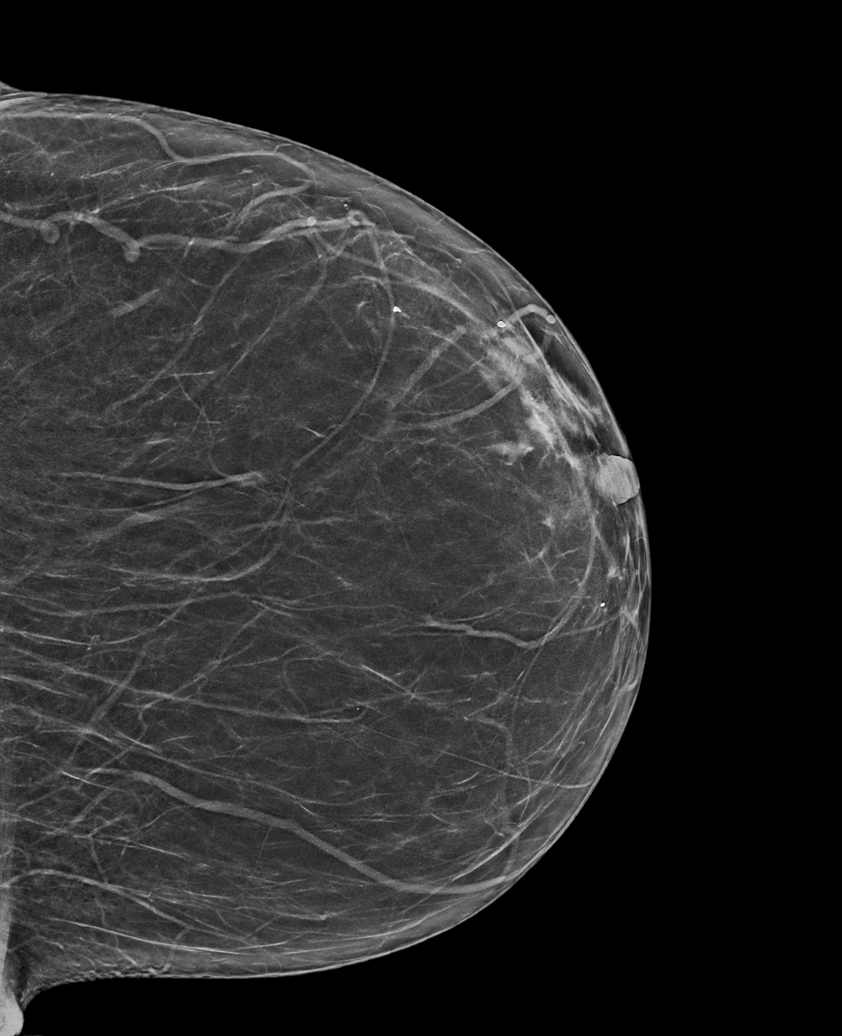

[6 of 36 positions shown; findings below may reference images not displayed]

FINDINGS: There are no findings suspicious for malignancy.
IMPRESSION: No mammographic evidence of malignancy. A result letter of this
screening mammogram will be mailed directly to the patient.

RECOMMENDATION:
Screening mammogram in one year. (Code:0E-3-N98)

BI-RADS CATEGORY  1: Negative.

## 2023-01-09 NOTE — Telephone Encounter (Signed)
Please review pathology report and let me know if I could give her results or schedule a follow up appointment? Please advise.

## 2023-01-10 NOTE — Addendum Note (Signed)
Addended by: Adela Ports on: 01/10/2023 11:22 AM   Modules accepted: Orders

## 2023-01-10 NOTE — Telephone Encounter (Signed)
Inform biopsies show she may have fatty liver disease as well as autoimmune hepatitis. I would like a second opinioon with [facility] hepatology Dr Sherryll Burger. Can we refer her please

## 2023-01-10 NOTE — Telephone Encounter (Signed)
Called patient to let her know the below information. Patient agreed and the referral will be faxed. I notified patient that [facility] will be reaching out to schedule her an appointment.

## 2023-01-10 NOTE — Telephone Encounter (Signed)
[facility] Hepatology Dr. Pervis Hocking Phone: [phone] Fax: [phone]

## 2023-01-17 ENCOUNTER — Telehealth: Payer: Self-pay | Admitting: Internal Medicine

## 2023-01-17 ENCOUNTER — Encounter: Payer: Self-pay | Admitting: Internal Medicine

## 2023-01-17 ENCOUNTER — Ambulatory Visit: Payer: Medicare HMO | Attending: Internal Medicine | Admitting: Internal Medicine

## 2023-01-17 DIAGNOSIS — R748 Abnormal levels of other serum enzymes: Secondary | ICD-10-CM | POA: Diagnosis not present

## 2023-01-17 DIAGNOSIS — I739 Peripheral vascular disease, unspecified: Secondary | ICD-10-CM | POA: Diagnosis not present

## 2023-01-17 DIAGNOSIS — E785 Hyperlipidemia, unspecified: Secondary | ICD-10-CM

## 2023-01-17 DIAGNOSIS — K719 Toxic liver disease, unspecified: Secondary | ICD-10-CM | POA: Diagnosis not present

## 2023-01-17 NOTE — Telephone Encounter (Signed)
Patient called to review e-visit instructions. Patient aware that the following changes have been made: STOP nexletol, START [id number] Patient aware that they will need the following labs: fasting lipid panel before next visit Patient aware that they will need the following test(s): N/A  Recall for N/A entered.  Scheduled for 3-4 month follow up -- 05/08/23  No further assistance needed at this time.

## 2023-01-17 NOTE — Progress Notes (Signed)
Virtual Visit via Video Note   This visit type was conducted due to national recommendations for restrictions regarding the COVID-19 Pandemic (e.g. social distancing) in an effort to limit this patient's exposure and mitigate transmission in our community.  Due to her co-morbid illnesses, this patient is at least at moderate risk for complications without adequate follow up.  This format is felt to be most appropriate for this patient at this time.  The patient does have access to video technology  All issues noted in this document were discussed and addressed.  A limited physical exam could be performed with this format.  Please refer to the patient's chart for her  consent to telehealth for [facility].   Date:  01/17/2023   ID:  Teresa Mcgee, DOB Jan 08, 1966, MRN [id number] The patient was identified using 2 identifiers.  Evaluation Performed:  Follow-Up Visit  Patient Location:  [address] [place] [address]  Provider location:   [address], [address] [place], [place] [address]  PCP:  Judy Pimple, MD  Cardiologist:  None Electrophysiologist:  None   Chief Complaint: Follow-up dyslipidemia  History of Present Illness:    Teresa Mcgee is a 57 y.o. female who presents via audio/video conferencing for a telehealth visit today.  This is a pleasant 57 year old female with type 2 diabetes, dyslipidemia, hypertension and history of statin intolerance to both atorvastatin and simvastatin, who presents for evaluation and management of dyslipidemia.  She recently had a lipid profile which demonstrated a total cholesterol of 204, triglycerides 117, HDL 46 and LDL 134.  Hemoglobin A1c is 5.8.  She has no known history of heart disease although she had a brother and father both who had heart disease in their 62s.  A CT scan of the abdomen and pelvis was done along with a CT angio of the chest in October 2019 for shortness of breath and upper abdominal pain,  this did not suggest any coronary artery calcification however there is evidence for "peripheral calcified atherosclerotic plaque".  Based on this, she does need aggressive lipid-lowering and would target an LDL to less than 70.  Unfortunately she has not been able to tolerate statins having myalgias.  She is hesitant to try another statin at this point.  She was placed on ezetimibe however her cholesterol remains above target.  We discussed options today.  11/24/2020  [name] seems to be doing well on Nexletol.  She is tolerating it without any significant side effects.  Repeat labs 6 days ago showed total cholesterol 143, triglycerides 175, HDL 34 and LDL at 79.  This is just slightly above goal although she has been more active recently and has made some dietary changes.  I suspect hopefully over the next several months that her cholesterol will become even lower.  09/19/2021  [name] is seen today in follow-up.  Unfortunately her lipids have gone up in the past 6 months.  We are hopeful that with dietary changes and weight loss she would reach her target on Nexletol and ezetimibe, however her most recent lipids demonstrated total cholesterol 167, triglycerides 164, HDL 34 and LDL 100.  Her goal LDL is less than 70 given PAD.  Her TSH was also a little suppressed therefore her thyroid medication was reduced.  This would actually be usually associated with a lower than expected cholesterol.  Therefore I anticipated going higher.  She will need additional therapy.  01/24/2022  [name] is seen  today in follow-up.  She reports she is tolerating rosuvastatin 5 mg every other day in addition to ezetimibe and Nexletol.  She did not have her lipids reassessed.  She will plan to get them done in [place] in the next few days.  She is wondering if she needs to continue all of them.  Overall I think if she is tolerating it well we could consider combining the Nexletol and Zetia use her pill  burden.  07/31/2022  [name] returns today for follow-up.  Since I last saw her she has had an increase in her liver enzymes.  The slow rise now shows AST 133 and ALT 86.  Based on this and the fact that her LDL was well-controlled in July at 46, advised her to stop her every other day rosuvastatin but to continue ezetimibe and Nexletol.  She did see a gastroenterologist and had an autoimmune workup.  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  She may undergo liver biopsy in February.  Since she has now been off of her statin.  I advised repeating her lipid profile and her liver enzymes.  01/17/2023  [name] is seen today in follow-up.  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  She has been referred to [facility] for management options.  She has had minimal improvement in liver enzymes after stopping her statin.  This makes sense if there is an underlying autoimmune mechanism.  Statin however could worsen her hepatitis as could the Nexletol and to a much lesser extent ezetimibe.  Based on that however stopping those medications would lead to higher cholesterol which is already well above her target LDL less than 70.  She will therefore need additional therapy and would do much better in my opinion on a PCSK9 inhibitor, medication less likely to cause any worsening hepatitis.  Prior CV studies:   The following studies were reviewed today:  Chart reviewed, lab work reviewed  PMHx:  Past Medical History:  Diagnosis Date   Arthritis    Bell's palsy may 2012   Chest pain    Diabetes mellitus without complication (HCC)    GERD (gastroesophageal reflux disease)    H/O hiatal hernia    Hyperlipidemia    Hypertension    Hypothyroidism    Shingles    Shortness of breath    Sleep apnea    uses cpap    Past Surgical History:  Procedure Laterality Date   ABDOMINAL HYSTERECTOMY      CESAREAN SECTION     cesarian     3x   COLONOSCOPY WITH PROPOFOL N/A 11/27/2019   Procedure: COLONOSCOPY WITH PROPOFOL;  Surgeon: Wyline Mood, MD;  Location: [facility] ENDOSCOPY;  Service: Gastroenterology;  Laterality: N/A;   ESOPHAGOGASTRODUODENOSCOPY (EGD) WITH PROPOFOL N/A 07/20/2020   Procedure: ESOPHAGOGASTRODUODENOSCOPY (EGD) WITH PROPOFOL;  Surgeon: Wyline Mood, MD;  Location[facility] ENDOSCOPY;  Service: Gastroenterology;  Laterality: N/A;   PARTIAL HYSTERECTOMY  2000   abnormal uterine bleeding   THYROIDECTOMY  1998   TOOTH EXTRACTION     TUBAL LIGATION      FAMHx:  Family History  Problem Relation Age of Onset   Heart attack Father 84       deceased   Diabetes Brother    Stroke Brother 25       twice   Stroke Sister 45   Diabetes Sister    Heart attack Sister 37  Heart attack Brother 22   Breast cancer Maternal Aunt     SOCHx:   reports that she has been smoking cigarettes. She has a 10.00 pack-year smoking history. She has never used smokeless tobacco. She reports that she does not drink alcohol and does not use drugs.  ALLERGIES:  Allergies  Allergen Reactions   Statins     myopathy    MEDS:  Current Meds  Medication Sig   Accu-Chek FastClix Lancets MISC USE UP TO 4 TIMES DAILY AS DIRECTED   ACCU-CHEK GUIDE test strip USE UP TO 4 TIMES DAILY AS DIRECTED   albuterol (VENTOLIN HFA) 108 (90 Base) MCG/ACT inhaler INHALE 2 PUFFS INTO THE LUNGS EVERY 4 HOURS AS NEEDED FOR (COUGH, SHORTNESS OF BREATH OR WHEEZING)   amLODipine (NORVASC) 10 MG tablet TAKE 1 TABLET BY MOUTH EVERY DAY   azelastine (OPTIVAR) 0.05 % ophthalmic solution Place 1 drop into both eyes 2 (two) times daily as needed.   blood glucose meter kit and supplies Dispense based on patient and insurance preference. Use up to four times daily as directed. (FOR ICD-10 E10.9, E11.9).   [employer] D3 125 MCG (5000 UT) capsule TAKE 1 CAPSULE BY MOUTH DAILY WITH BREAKFAST. TAKE ALONG WITH CALCIUM AND MAGNESIUM.    ezetimibe (ZETIA) 10 MG tablet TAKE 1 TABLET BY MOUTH EVERY DAY   fluticasone (FLONASE) 50 MCG/ACT nasal spray SPRAY 2 SPRAYS INTO EACH NOSTRIL EVERY DAY   gabapentin (NEURONTIN) 100 MG capsule TAKE 1 CAPSULE (100 MG TOTAL) BY MOUTH 2 (TWO) TIMES DAILY AND 3 CAPSULES (300 MG TOTAL) AT BEDTIME.   hydrochlorothiazide (HYDRODIURIL) 25 MG tablet TAKE 1 TABLET (25 MG TOTAL) BY MOUTH DAILY. AS DIRECTED   levocetirizine (XYZAL) 5 MG tablet TAKE 1 TABLET BY MOUTH EVERY DAY IN THE EVENING   levothyroxine (SYNTHROID) 112 MCG tablet TAKE 1 TABLET BY MOUTH EVERY DAY   Magnesium Oxide -Mg Supplement 500 MG CAPS TAKE 1 CAPSULE (500 MG TOTAL) BY MOUTH 2 (TWO) TIMES DAILY AT 8 AM AND 10 PM.   meloxicam (MOBIC) 15 MG tablet TAKE 1 TABLET BY MOUTH EVERY DAY WITH FOOD AS NEEDED FOR PAIN   metFORMIN (GLUCOPHAGE-XR) 500 MG 24 hr tablet Take 1 tablet (500 mg total) by mouth 2 (two) times daily with a meal.   naloxone (NARCAN) nasal spray 4 mg/0.1 mL Place 1 spray into the nose as needed for up to 365 doses (for opioid-induced respiratory depresssion). In case of emergency (overdose), spray once into each nostril. If no response within 3 minutes, repeat application and call 911.   NEXLETOL 180 MG TABS TAKE 1 TABLET BY MOUTH EVERY DAY   Olopatadine HCl 0.2 % SOLN INSTILL 1 DROP INTO BOTH EYES EVERY DAY AS NEEDED   oxyCODONE (OXY IR/ROXICODONE) 5 MG immediate release tablet Take 1 tablet (5 mg total) by mouth every 8 (eight) hours as needed for severe pain. Must last 30 days   [START ON 01/30/2023] oxyCODONE (OXY IR/ROXICODONE) 5 MG immediate release tablet Take 1 tablet (5 mg total) by mouth every 8 (eight) hours as needed for severe pain. Must last 30 days   potassium chloride (KLOR-CON 10) 10 MEQ tablet Take 1 tablet (10 mEq total) by mouth daily.   REFRESH CELLUVISC 1 % GEL APPLY 1 DROP TO EYE 3 (THREE) TIMES DAILY.   tiZANidine (ZANAFLEX) 4 MG tablet TAKE 1 TABLET (4 MG TOTAL) BY MOUTH EVERY 8 (EIGHT) HOURS AS NEEDED FOR  MUSCLE SPASMS     ROS: Pertinent  items noted in HPI and remainder of comprehensive ROS otherwise negative.  Labs/Other Tests and Data Reviewed:    Recent Labs: 02/21/2022: Magnesium 1.7 12/01/2022: TSH 0.78 12/29/2022: ALT 112; BUN 12; Creatinine, Ser 0.89; Hemoglobin 13.9; Platelets 187; Potassium 3.4; Sodium 140   Recent Lipid Panel Lab Results  Component Value Date/Time   CHOL 158 12/01/2022 11:50 AM   CHOL 143 11/18/2020 10:55 AM   TRIG 263.0 (H) 12/01/2022 11:50 AM   HDL 33.00 (L) 12/01/2022 11:50 AM   HDL 34 (L) 11/18/2020 10:55 AM   CHOLHDL 5 12/01/2022 11:50 AM   LDLCALC 81 08/01/2022 12:34 PM   LDLCALC 79 11/18/2020 10:55 AM   LDLCALC 159 (H) 11/16/2017 03:29 PM   LDLDIRECT 103.0 12/01/2022 11:50 AM    Wt Readings from Last 3 Encounters:  12/29/22 236 lb (107 kg)  12/01/22 237 lb 6 oz (107.7 kg)  11/29/22 236 lb (107 kg)     Exam:    Vital Signs:  LMP  (LMP Unknown)    General appearance: alert, no distress and morbidly obese Lungs: No visual respiratory difficulty Abdomen: Obese Extremities: extremities normal, atraumatic, no cyanosis or edema Skin: Skin color, texture, turgor normal. No rashes or lesions Neurologic: Grossly normal Psych: Pleasant  ASSESSMENT & PLAN:    Mixed dyslipidemia, goal LDL less than 70 Type 2 diabetes Hypertension Family history of premature coronary disease PAD with calcified peripheral atherosclerosis Statin intolerant-myalgias Elevated liver enzymes  Ms. Mazor was diagnosed with a combination of hepatic steatosis and autoimmune hepatitis.  After stopping her statin, she had some minimal improvement in liver enzymes although they remain significantly elevated.  She should also likely stop her Nexletol which possibly worsen her liver enzymes.  I would like to pursue a PCSK9 inhibitor as this is much better tolerated and less likely to cause a hepatitis.  In addition she is being referred to [facility] for management  options of autoimmune hepatitis.  COVID-19 Education: The signs and symptoms of COVID-19 were discussed with the patient and how to seek care for testing (follow up with PCP or arrange E-visit).  The importance of social distancing was discussed today.  Patient Risk:   After full review of this patients clinical status, I feel that they are at least moderate risk at this time.  Time:   Today, I have spent 25 minutes with the patient with telehealth technology discussing dyslipidemia, goal LDL cholesterol.     Medication Adjustments/Labs and Tests Ordered: Current medicines are reviewed at length with the patient today.  Concerns regarding medicines are outlined above.   Tests Ordered: No orders of the defined types were placed in this encounter.   Medication Changes: No orders of the defined types were placed in this encounter.   Disposition:  in 4 month(s)  Chrystie Nose, MD, [facility], FACP  [employer]  [facility]  Medical Director of the Advanced Lipid Disorders &  Cardiovascular Risk Reduction Clinic Diplomate of the American Board of Clinical Lipidology Attending Cardiologist  Direct Dial: [phone]  Fax: [phone]  Website:  [email or web address]  Chrystie Nose, MD  01/17/2023 9:15 AM

## 2023-01-17 NOTE — Patient Instructions (Signed)
Medication Instructions:  STOP Nexletol   Dr. Rennis Golden recommends Repatha Sureclick 140mg /ml (PCSK9). This is an injectable cholesterol medication self-administered once every 14 days. This medication will likely need prior approval with your insurance company, which we will work on. If the medication is not approved initially, we may need to do an appeal with your insurance.   Administer medication in area of fatty tissue such as abdomen, outer thigh, back of upper arm - and rotate site with each injection Store medication in refrigerator until ready to administer - allow to sit at room temp for 30 mins - 1 hour prior to injection Dispose of medication in a SHARPS container - your pharmacy should be able to direct you on this and proper disposal   If you need a co-pay card for Repatha: Lawsponsor.fr If you need a co-pay card for Praluent: [email or web address]  Patient Assistance:    These foundations have funds at various times.   The [employer] Foundation: https://www.panfoundation.org/disease-funds/hypercholesterolemia/ -- can sign up for wait list  The [facility] offers assistance to help pay for medication copays.  They will cover copays for all cholesterol lowering meds, including statins, fibrates, omega-3 fish oils like Vascepa, ezetimibe, Repatha, Praluent, Nexletol, Nexlizet.  The cards are usually good for $2,500 or 12 months, whichever comes first. Our fax # is [phone] (you will need this to apply) Go to healthwellfoundation.org Click on "Apply Now" Answer questions as to whom is applying (patient or representative) Your disease fund will be "hypercholesterolemia - Medicare access" They will ask questions about finances and which medications you are taking for cholesterol When you submit, the approval is usually within minutes.  You will need to print the card information from the site You will need to show this information to  your pharmacy, they will bill your Medicare Part D plan first -then bill Health Well --for the copay.   You can also call them at [phone], although the hold times can be quite long.     *If you need a refill on your cardiac medications before your next appointment, please call your pharmacy*   Lab Work: FASTING lipid panel in 3-4 months  If you have labs (blood work) drawn today and your tests are completely normal, you will receive your results only by: MyChart Message (if you have MyChart) OR A paper copy in the mail If you have any lab test that is abnormal or we need to change your treatment, we will call you to review the results.   Follow-Up: At [facility], you and your health needs are our priority.  As part of our continuing mission to provide you with exceptional heart care, we have created designated Provider Care Teams.  These Care Teams include your primary Cardiologist (physician) and Advanced Practice Providers (APPs -  Physician Assistants and Nurse Practitioners) who all work together to provide you with the care you need, when you need it.  We recommend signing up for the patient portal called "MyChart".  Sign up information is provided on this After Visit Summary.  MyChart is used to connect with patients for Virtual Visits (Telemedicine).  Patients are able to view lab/test results, encounter notes, upcoming appointments, etc.  Non-urgent messages can be sent to your provider as well.   To learn more about what you can do with MyChart, go to [email or web address].    Your next appointment:    3-4 months with Dr. Rennis Golden

## 2023-01-18 ENCOUNTER — Other Ambulatory Visit (HOSPITAL_COMMUNITY): Payer: Self-pay

## 2023-01-18 ENCOUNTER — Telehealth: Payer: Self-pay

## 2023-01-18 MED ORDER — REPATHA SURECLICK 140 MG/ML ~~LOC~~ SOAJ: 1.0000 mL | SUBCUTANEOUS | 3 refills | Status: DC

## 2023-01-18 NOTE — Telephone Encounter (Signed)
Patient aware med is approved. Rx(s) sent to pharmacy electronically.  

## 2023-01-18 NOTE — Telephone Encounter (Signed)
Pharmacy Patient Advocate Encounter  Prior Authorization for REPATHA has been APPROVED by [facility] from 1.1.24 to 12.31.24.

## 2023-01-18 NOTE — Telephone Encounter (Signed)
Pharmacy Patient Advocate Encounter   Received notification from RN that prior authorization for REPATHA 140MG /ML is required/requested.   PA submitted to [facility] via CoverMyMeds Key or (Medicaid) confirmation # [id number]   Status is pending

## 2023-01-19 ENCOUNTER — Ambulatory Visit (INDEPENDENT_AMBULATORY_CARE_PROVIDER_SITE_OTHER): Payer: Medicare HMO | Admitting: Podiatry

## 2023-01-19 VITALS — BP 151/82

## 2023-01-19 DIAGNOSIS — E119 Type 2 diabetes mellitus without complications: Secondary | ICD-10-CM

## 2023-01-19 DIAGNOSIS — M79674 Pain in right toe(s): Secondary | ICD-10-CM

## 2023-01-19 DIAGNOSIS — B351 Tinea unguium: Secondary | ICD-10-CM | POA: Diagnosis not present

## 2023-01-19 DIAGNOSIS — M79675 Pain in left toe(s): Secondary | ICD-10-CM

## 2023-01-19 NOTE — Progress Notes (Signed)
  Subjective:  Patient ID: Teresa Mcgee, female    DOB: 07-18-1966,  MRN: [id number]  Teresa Mcgee presents to clinic today for at risk foot care. Pt has h/o NIDDM with PAD and painful elongated mycotic toenails 1-5 bilaterally which are tender when wearing enclosed shoe gear. Pain is relieved with periodic professional debridement.  Chief Complaint  Patient presents with   Nail Problem    DFC,A1C:6.5,BS: not taken today ,Referring Provider Tower, Audrie Gallus, MD,LOV:04/24      New problem(s): None.   PCP is Tower, Audrie Gallus, MD.  Allergies  Allergen Reactions   Statins     myopathy    Review of Systems: Negative except as noted in the HPI.  Objective: No changes noted in today's physical examination. Vitals:   01/19/23 0818  BP: (!) 151/82   Teresa Mcgee is a pleasant 57 y.o. female obese in NAD. AAO x 3.  Vascular Examination: Capillary refill time immediate b/l. Vascular status intact b/l with palpable DP pulses; nonpalpable PT pulses. Pedal hair absent b/l. No edema. No pain with calf compression b/l. Skin temperature gradient WNL b/l. No cyanosis or clubbing noted b/l. No ischemia or gangrene b/l.   Neurological Examination: Sensation grossly intact b/l with 10 gram monofilament. Vibratory sensation intact b/l.   Dermatological Examination: Pedal skin with normal turgor, texture and tone b/l.  No open wounds. No interdigital macerations.   Toenails 1-5 b/l thick, discolored, elongated with subungual debris and pain on dorsal palpation.   No hyperkeratotic nor porokeratotic lesions present on today's visit.  Musculoskeletal Examination: Normal muscle strength 5/5 to all lower extremity muscle groups bilaterally. No pain, crepitus or joint limitation noted with ROM b/l LE. No gross bony pedal deformities b/l. Patient ambulates independently without assistive aids.  Radiographs: None  Last A1c:      Latest Ref Rng & Units 12/01/2022   11:50 AM 02/21/2022    12:03 PM  Hemoglobin A1C  Hemoglobin-A1c 4.6 - 6.5 % 6.5  6.3    Assessment/Plan: 1. Pain due to onychomycosis of toenails of both feet   2. Type 2 diabetes mellitus without complication, without long-term current use of insulin (HCC)     Patient was evaluated and treated. All patient's and/or POA's questions/concerns addressed on today's visit. Toenails 1-5 debrided in length and girth without incident. Continue soft, supportive shoe gear daily. Report any pedal injuries to medical professional. Call office if there are any questions/concerns. -Patient/POA to call should there be question/concern in the interim.   Return in about 4 months (around 05/21/2023).  Freddie Breech, DPM

## 2023-01-22 ENCOUNTER — Other Ambulatory Visit: Payer: Self-pay | Admitting: Family Medicine

## 2023-01-22 DIAGNOSIS — M7918 Myalgia, other site: Secondary | ICD-10-CM

## 2023-01-22 DIAGNOSIS — M62838 Other muscle spasm: Secondary | ICD-10-CM

## 2023-01-26 ENCOUNTER — Other Ambulatory Visit: Payer: Self-pay | Admitting: Family Medicine

## 2023-01-26 ENCOUNTER — Encounter: Payer: Self-pay | Admitting: Podiatry

## 2023-02-05 ENCOUNTER — Other Ambulatory Visit: Payer: Self-pay | Admitting: Family Medicine

## 2023-02-05 DIAGNOSIS — J302 Other seasonal allergic rhinitis: Secondary | ICD-10-CM

## 2023-02-07 ENCOUNTER — Other Ambulatory Visit: Payer: Self-pay | Admitting: Family Medicine

## 2023-02-07 DIAGNOSIS — I10 Essential (primary) hypertension: Secondary | ICD-10-CM

## 2023-02-22 ENCOUNTER — Ambulatory Visit: Payer: Medicare HMO

## 2023-02-25 ENCOUNTER — Other Ambulatory Visit: Payer: Self-pay | Admitting: Family Medicine

## 2023-02-25 DIAGNOSIS — I10 Essential (primary) hypertension: Secondary | ICD-10-CM

## 2023-02-25 DIAGNOSIS — E1165 Type 2 diabetes mellitus with hyperglycemia: Secondary | ICD-10-CM

## 2023-02-26 ENCOUNTER — Other Ambulatory Visit: Payer: Self-pay | Admitting: Family Medicine

## 2023-02-28 ENCOUNTER — Encounter: Payer: Self-pay | Admitting: Pain Medicine

## 2023-02-28 ENCOUNTER — Ambulatory Visit: Payer: Medicare HMO | Attending: Pain Medicine | Admitting: Pain Medicine

## 2023-02-28 ENCOUNTER — Other Ambulatory Visit: Payer: Self-pay | Admitting: Pain Medicine

## 2023-02-28 VITALS — BP 139/72 | HR 70 | Temp 97.5°F | Ht 63.0 in | Wt 234.0 lb

## 2023-02-28 DIAGNOSIS — M79604 Pain in right leg: Secondary | ICD-10-CM | POA: Diagnosis not present

## 2023-02-28 DIAGNOSIS — F1120 Opioid dependence, uncomplicated: Secondary | ICD-10-CM | POA: Diagnosis not present

## 2023-02-28 DIAGNOSIS — Z79891 Long term (current) use of opiate analgesic: Secondary | ICD-10-CM | POA: Insufficient documentation

## 2023-02-28 DIAGNOSIS — M545 Low back pain, unspecified: Secondary | ICD-10-CM | POA: Insufficient documentation

## 2023-02-28 DIAGNOSIS — M542 Cervicalgia: Secondary | ICD-10-CM | POA: Diagnosis not present

## 2023-02-28 DIAGNOSIS — M25552 Pain in left hip: Secondary | ICD-10-CM | POA: Insufficient documentation

## 2023-02-28 DIAGNOSIS — M47816 Spondylosis without myelopathy or radiculopathy, lumbar region: Secondary | ICD-10-CM | POA: Diagnosis not present

## 2023-02-28 DIAGNOSIS — M79602 Pain in left arm: Secondary | ICD-10-CM | POA: Insufficient documentation

## 2023-02-28 DIAGNOSIS — M25562 Pain in left knee: Secondary | ICD-10-CM | POA: Insufficient documentation

## 2023-02-28 DIAGNOSIS — M25561 Pain in right knee: Secondary | ICD-10-CM

## 2023-02-28 DIAGNOSIS — M5137 Other intervertebral disc degeneration, lumbosacral region: Secondary | ICD-10-CM | POA: Insufficient documentation

## 2023-02-28 DIAGNOSIS — M79601 Pain in right arm: Secondary | ICD-10-CM | POA: Diagnosis not present

## 2023-02-28 DIAGNOSIS — M25551 Pain in right hip: Secondary | ICD-10-CM

## 2023-02-28 DIAGNOSIS — M79605 Pain in left leg: Secondary | ICD-10-CM | POA: Insufficient documentation

## 2023-02-28 DIAGNOSIS — M533 Sacrococcygeal disorders, not elsewhere classified: Secondary | ICD-10-CM | POA: Diagnosis not present

## 2023-02-28 DIAGNOSIS — G8929 Other chronic pain: Secondary | ICD-10-CM | POA: Diagnosis not present

## 2023-02-28 DIAGNOSIS — Z79899 Other long term (current) drug therapy: Secondary | ICD-10-CM | POA: Insufficient documentation

## 2023-02-28 DIAGNOSIS — G894 Chronic pain syndrome: Secondary | ICD-10-CM

## 2023-02-28 MED ORDER — NALOXONE HCL 4 MG/0.1ML NA LIQD
1.0000 | NASAL | 0 refills | Status: AC | PRN
Start: 2023-02-28 — End: 2024-02-28

## 2023-02-28 MED ORDER — OXYCODONE HCL 5 MG PO TABS
5.0000 mg | ORAL_TABLET | Freq: Three times a day (TID) | ORAL | 0 refills | Status: DC | PRN
Start: 2023-03-01 — End: 2023-05-27

## 2023-02-28 MED ORDER — OXYCODONE HCL 5 MG PO TABS
5.0000 mg | ORAL_TABLET | Freq: Three times a day (TID) | ORAL | 0 refills | Status: DC | PRN
Start: 2023-04-30 — End: 2023-05-27

## 2023-02-28 MED ORDER — OXYCODONE HCL 5 MG PO TABS
5.0000 mg | ORAL_TABLET | Freq: Three times a day (TID) | ORAL | 0 refills | Status: DC | PRN
Start: 2023-03-31 — End: 2023-05-27

## 2023-02-28 NOTE — Progress Notes (Signed)
Safety precautions to be maintained throughout the outpatient stay will include: orient to surroundings, keep bed in low position, maintain call bell within reach at all times, provide assistance with transfer out of bed and ambulation.   Nursing Pain Medication Assessment:  Safety precautions to be maintained throughout the outpatient stay will include: orient to surroundings, keep bed in low position, maintain call bell within reach at all times, provide assistance with transfer out of bed and ambulation.  Medication Inspection Compliance: Pill count conducted under aseptic conditions, in front of the patient. Neither the pills nor the bottle was removed from the patient's sight at any time. Once count was completed pills were immediately returned to the patient in their original bottle.  Medication: Oxycodone IR Pill/Patch Count:  3 of 90 pills remain Pill/Patch Appearance: Markings consistent with prescribed medication Bottle Appearance: Standard pharmacy container. Clearly labeled. Filled Date: 6 / 30 / 2024 Last Medication intake:  Yesterday

## 2023-02-28 NOTE — Progress Notes (Signed)
PROVIDER NOTE: Information contained herein reflects review and annotations entered in association with encounter. Interpretation of such information and data should be left to medically-trained personnel. Information provided to patient can be located elsewhere in the medical record under "Patient Instructions". Document created using STT-dictation technology, any transcriptional errors that may result from process are unintentional.    Patient: Teresa Mcgee  Service Category: E/M  Provider: Oswaldo Done, MD  DOB: March 25, 1966  DOS: 02/28/2023  Referring Provider: Judy Pimple, MD  MRN: [id number]  Specialty: Interventional Pain Management  PCP: Judy Pimple, MD  Type: Established Patient  Setting: Ambulatory outpatient    Location: Office  Delivery: Face-to-face     HPI  Teresa Mcgee, a 57 y.o. year old female, is here today because of her Chronic pain syndrome [G89.4]. Teresa Mcgee primary complain today is Back Pain (Lower back and hands)  Pertinent problems: Teresa Mcgee has Muscle spasms of neck; Numbness and tingling in both hands; Chronic low back pain (Bilateral) (L>R) w/ sciatica (Bilateral); Chronic lower extremity pain (2ry area of Pain) (Bilateral) (L>R); Chronic neck pain (3ry area of Pain) (Bilateral) (L>R); Chronic pain syndrome; Chronic upper extremity pain (4th area of Pain) (Bilateral) (L>R); Cervicalgia; Chronic musculoskeletal pain; DDD (degenerative disc disease), thoracic; DDD (degenerative disc disease), cervical; Lumbar facet arthropathy (Bilateral); Lumbar facet syndrome (Bilateral); Chronic low back pain (1ry area of Pain) (Bilateral) (L>R) w/o sciatica; Spondylosis without myelopathy or radiculopathy, cervical region; Spondylosis without myelopathy or radiculopathy, lumbosacral region; DDD (degenerative disc disease), lumbosacral; Strain of lumbar paraspinal muscle, sequela; Chronic hip pain (Bilateral) (R>L); Chronic sacroiliac joint pain (Left); Neurogenic  pain; Osteoarthritis involving multiple joints; and Chronic knee pain (Bilateral) (R>L) on their pertinent problem list. Pain Assessment: Severity of Chronic pain is reported as a 5 /10. Location: Back Lower/denies. Onset: More than a month ago. Quality: Constant, Aching. Timing: Constant. Modifying factor(s): meds, sitting down. Vitals:  height is 5\' 3"  (1.6 m) and weight is 234 lb (106.1 kg). Her temporal temperature is 97.5 F (36.4 C) (abnormal). Her blood pressure is 139/72 and her pulse is 70. Her oxygen saturation is 100%.  BMI: Estimated body mass index is 41.45 kg/m as calculated from the following:   Height as of this encounter: 5\' 3"  (1.6 m).   Weight as of this encounter: 234 lb (106.1 kg). Last encounter: 11/29/2022. Last procedure: Visit date not found.  Reason for encounter: medication management.  The patient indicates doing well with the current medication regimen. No adverse reactions or side effects reported to the medications.   Routine UDS ordered today.   RTCB: 05/30/2023   Pharmacotherapy Assessment  Analgesic: Oxycodone IR 5 mg, 1 tab PO q 8 hrs (PRN) (15 mg/day of oxycodone) MME/day: 22.5 mg/day.   Monitoring: [place] PMP: PDMP reviewed during this encounter.       Pharmacotherapy: No side-effects or adverse reactions reported. Compliance: No problems identified. Effectiveness: Clinically acceptable.  Florina Ou, RN  02/28/2023 11:40 AM  Sign when Signing Visit Safety precautions to be maintained throughout the outpatient stay will include: orient to surroundings, keep bed in low position, maintain call bell within reach at all times, provide assistance with transfer out of bed and ambulation.   Nursing Pain Medication Assessment:  Safety precautions to be maintained throughout the outpatient stay will include: orient to surroundings, keep bed in low position, maintain call bell within reach at all times, provide assistance with transfer out of bed and  ambulation.  Medication  Inspection Compliance: Pill count conducted under aseptic conditions, in front of the patient. Neither the pills nor the bottle was removed from the patient's sight at any time. Once count was completed pills were immediately returned to the patient in their original bottle.  Medication: Oxycodone IR Pill/Patch Count:  3 of 90 pills remain Pill/Patch Appearance: Markings consistent with prescribed medication Bottle Appearance: Standard pharmacy container. Clearly labeled. Filled Date: 51 / 93 / 2024 Last Medication intake:  Yesterday    No results found for: "CBDTHCR" No results found for: "D8THCCBX" No results found for: "D9THCCBX"  UDS:  Summary  Date Value Ref Range Status  02/27/2022 Note  Final    Comment:    ==================================================================== ToxASSURE Select 13 (MW) ==================================================================== Test                             Result       Flag       Units  Drug Present and Declared for Prescription Verification   Oxymorphone                    446          EXPECTED   ng/mg creat    Sources of oxymorphone include scheduled prescription medications;    it is also an expected metabolite of oxycodone.  Drug Absent but Declared for Prescription Verification   Oxycodone                      Not Detected UNEXPECTED ng/mg creat    Oxycodone is almost always present in patients taking this drug    consistently.  Absence of oxycodone could be due to lapse of time    since the last dose or unusual pharmacokinetics (rapid metabolism).  ==================================================================== Test                      Result    Flag   Units      Ref Range   Creatinine              28               mg/dL      >=81 ==================================================================== Declared Medications:  The flagging and interpretation on this report are based on the   following declared medications.  Unexpected results may arise from  inaccuracies in the declared medications.   **Note: The testing scope of this panel includes these medications:   Oxycodone (Roxicodone)   **Note: The testing scope of this panel does not include the  following reported medications:   Albuterol (Ventolin HFA)  Amlodipine (Norvasc)  Azelastine (Optivar)  Bempedoic Acid (Nexletol)  Eye Drop  Ezetimibe (Zetia)  Fluticasone (Flonase)  Gabapentin (Neurontin)  Hydrochlorothiazide (Hydrodiuril)  Levocetirizine (Xyzal)  Levothyroxine (Synthroid)  Magnesium (Mag-Ox)  Meloxicam (Mobic)  Metformin (Glucophage)  Olopatadine  Potassium (Klor-Con)  Rosuvastatin (Crestor)  Tizanidine (Zanaflex)  Vitamin D3 ==================================================================== For clinical consultation, please call [phone]. ====================================================================       ROS  Constitutional: Denies any fever or chills Gastrointestinal: No reported hemesis, hematochezia, vomiting, or acute GI distress Musculoskeletal: Denies any acute onset joint swelling, redness, loss of ROM, or weakness Neurological: No reported episodes of acute onset apraxia, aphasia, dysarthria, agnosia, amnesia, paralysis, loss of coordination, or loss of consciousness  Medication Review  Accu-Chek FastClix Lancets, Carboxymethylcellulose Sod PF, Cholecalciferol, Evolocumab, Magnesium Oxide -Mg Supplement, Olopatadine HCl, albuterol,  amLODipine, azelastine, blood glucose meter kit and supplies, ezetimibe, fluticasone, gabapentin, glucose blood, hydrochlorothiazide, levocetirizine, levothyroxine, meloxicam, metFORMIN, naloxone, oxyCODONE, potassium chloride, and tiZANidine  History Review  Allergy: Teresa Mcgee is allergic to statins. Drug: Teresa Mcgee  reports no history of drug use. Alcohol:  reports no history of alcohol use. Tobacco:  reports that she has been  smoking cigarettes. She has a 10 pack-year smoking history. She has never used smokeless tobacco. Social: Teresa Mcgee  reports that she has been smoking cigarettes. She has a 10 pack-year smoking history. She has never used smokeless tobacco. She reports that she does not drink alcohol and does not use drugs. Medical:  has a past medical history of Arthritis, Bell's palsy (may 2012), Chest pain, Diabetes mellitus without complication (HCC), GERD (gastroesophageal reflux disease), H/O hiatal hernia, Hyperlipidemia, Hypertension, Hypothyroidism, Shingles, Shortness of breath, and Sleep apnea. Surgical: Teresa Mcgee  has a past surgical history that includes Thyroidectomy (1998); cesarian; Partial hysterectomy (2000); Cesarean section; Tubal ligation; Tooth extraction; Abdominal hysterectomy; Colonoscopy with propofol (N/A, 11/27/2019); and Esophagogastroduodenoscopy (egd) with propofol (N/A, 07/20/2020). Family: family history includes Breast cancer in her maternal aunt; Diabetes in her brother and sister; Heart attack (age of onset: 2) in her brother; Heart attack (age of onset: 65) in her sister; Heart attack (age of onset: 27) in her father; Stroke (age of onset: 67) in her brother; Stroke (age of onset: 75) in her sister.  Laboratory Chemistry Profile   Renal Lab Results  Component Value Date   BUN 12 12/29/2022   CREATININE 0.89 12/29/2022   BCR 8 (L) 01/24/2018   GFR 70.61 12/01/2022   GFRAA >60 05/25/2018   GFRNONAA >60 12/29/2022    Hepatic Lab Results  Component Value Date   AST 169 (H) 12/29/2022   ALT 112 (H) 12/29/2022   ALBUMIN 4.3 12/29/2022   ALKPHOS 77 12/29/2022   LIPASE 25 05/25/2018    Electrolytes Lab Results  Component Value Date   NA 140 12/29/2022   K 3.4 (L) 12/29/2022   CL 102 12/29/2022   CALCIUM 9.7 12/29/2022   MG 1.7 02/21/2022    Bone Lab Results  Component Value Date   VD25OH 68.72 08/19/2021   25OHVITD1 9.3 (L) 01/24/2018   25OHVITD2 <1.0 01/24/2018    25OHVITD3 9.3 01/24/2018    Inflammation (CRP: Acute Phase) (ESR: Chronic Phase) Lab Results  Component Value Date   CRP 5.5 (H) 01/24/2018   ESRSEDRATE 40 01/24/2018         Note: Above Lab results reviewed.  Recent Imaging Review  US BIOPSY (LIVER) INDICATION: 57 year old female with a history of medical liver disease referred for biopsy  EXAM: IMAGE GUIDED MEDICAL LIVER BIOPSY  MEDICATIONS: None.  ANESTHESIA/SEDATION: Moderate (conscious) sedation was employed during this procedure. A total of Versed 1.0 mg and Fentanyl 50 mcg was administered intravenously by the radiology nurse.  Total intra-service moderate Sedation Time: 10 minutes. The patient's level of consciousness and vital signs were monitored continuously by radiology nursing throughout the procedure under my direct supervision.  COMPLICATIONS: None  PROCEDURE: Informed written consent was obtained from the patient after a thorough discussion of the procedural risks, benefits and alternatives. All questions were addressed. Maximal Sterile Barrier Technique was utilized including caps, mask, sterile gowns, sterile gloves, sterile drape, hand hygiene and skin antiseptic. A timeout was performed prior to the initiation of the procedure.  Ultrasound survey of the right liver lobe performed with images stored and sent to PACs.  The right  lower thorax/right upper abdomen was prepped with chlorhexidine in a sterile fashion, and a sterile drape was applied covering the operative field. A sterile gown and sterile gloves were used for the procedure. Local anesthesia was provided with 1% Lidocaine.  The patient was prepped and draped sterilely and the skin and subcutaneous tissues were generously infiltrated with 1% lidocaine.  A 17 gauge introducer needle was then advanced under ultrasound guidance in a subcostal location into the right liver lobe. The stylet was removed, and multiple separate 18 gauge  core biopsy were retrieved. Samples were placed into formalin for transportation to the lab.  Gel-Foam pledgets were then infused with a small amount of saline for assistance with hemostasis.  The needle was removed, and a final ultrasound image was performed.  The patient tolerated the procedure well and remained hemodynamically stable throughout.  No complications were encountered and no significant blood loss was encounter.  IMPRESSION: Status post ultrasound-guided medical liver biopsy  Signed,  Yvone Neu. Miachel Roux, RPVI  [facility]  [facility]  Electronically Signed   By: Gilmer Mor D.O.   On: 12/29/2022 11:23 Note: Reviewed        Physical Exam  General appearance: Well nourished, well developed, and well hydrated. In no apparent acute distress Mental status: Alert, oriented x 3 (person, place, & time)       Respiratory: No evidence of acute respiratory distress Eyes: PERLA Vitals: BP 139/72 (BP Location: Right Arm, Patient Position: Sitting, Cuff Size: Large)   Pulse 70   Temp (!) 97.5 F (36.4 C) (Temporal)   Ht 5\' 3"  (1.6 m)   Wt 234 lb (106.1 kg)   LMP  (LMP Unknown)   SpO2 100%   BMI 41.45 kg/m  BMI: Estimated body mass index is 41.45 kg/m as calculated from the following:   Height as of this encounter: 5\' 3"  (1.6 m).   Weight as of this encounter: 234 lb (106.1 kg). Ideal: Ideal body weight: 52.4 kg (115 lb 8.3 oz) Adjusted ideal body weight: 73.9 kg (162 lb 14.6 oz)  Assessment   Diagnosis Status  1. Chronic pain syndrome   2. Chronic low back pain (1ry area of Pain) (Bilateral) (L>R) w/o sciatica   3. Chronic lower extremity pain (2ry area of Pain) (Bilateral) (L>R)   4. Chronic neck pain (3ry area of Pain) (Bilateral) (L>R)   5. Chronic upper extremity pain (4th area of Pain) (Bilateral) (L>R)   6. DDD (degenerative disc disease), lumbosacral   7. Chronic sacroiliac joint pain (Left)    8. Lumbar facet syndrome (Bilateral)   9. Chronic knee pain (Bilateral) (R>L)   10. Chronic hip pain (Bilateral) (R>L)   11. Pharmacologic therapy   12. Chronic use of opiate for therapeutic purpose   13. Uncomplicated opioid dependence (HCC)   14. Encounter for medication management   15. Encounter for chronic pain management    Controlled Controlled Controlled   Updated Problems: No problems updated.  Plan of Care  Problem-specific:  No problem-specific Assessment & Plan notes found for this encounter.  Teresa Mcgee has a current medication list which includes the following long-term medication(s): albuterol, amlodipine, ezetimibe, fluticasone, gabapentin, hydrochlorothiazide, levocetirizine, levothyroxine, magnesium oxide -mg supplement, meloxicam, metformin, [START ON 03/01/2023] oxycodone, [START ON 03/31/2023] oxycodone, [START ON 04/30/2023] oxycodone, and potassium chloride.  Pharmacotherapy (Medications Ordered): Meds ordered this encounter  Medications   oxyCODONE (OXY IR/ROXICODONE) 5 MG immediate release tablet    Sig:  Take 1 tablet (5 mg total) by mouth every 8 (eight) hours as needed for severe pain. Must last 30 days    Dispense:  90 tablet    Refill:  0    DO NOT: delete (not duplicate); no partial-fill (will deny script to complete), no refill request (F/U required). DISPENSE: 1 day early if closed on fill date. WARN: No CNS-depressants within 8 hrs of med.   oxyCODONE (OXY IR/ROXICODONE) 5 MG immediate release tablet    Sig: Take 1 tablet (5 mg total) by mouth every 8 (eight) hours as needed for severe pain. Must last 30 days    Dispense:  90 tablet    Refill:  0    DO NOT: delete (not duplicate); no partial-fill (will deny script to complete), no refill request (F/U required). DISPENSE: 1 day early if closed on fill date. WARN: No CNS-depressants within 8 hrs of med.   oxyCODONE (OXY IR/ROXICODONE) 5 MG immediate release tablet    Sig: Take 1 tablet (5 mg  total) by mouth every 8 (eight) hours as needed for severe pain. Must last 30 days    Dispense:  90 tablet    Refill:  0    DO NOT: delete (not duplicate); no partial-fill (will deny script to complete), no refill request (F/U required). DISPENSE: 1 day early if closed on fill date. WARN: No CNS-depressants within 8 hrs of med.   naloxone (NARCAN) nasal spray 4 mg/0.1 mL    Sig: Place 1 spray into the nose as needed for up to 365 doses (for opioid-induced respiratory depresssion). In case of emergency (overdose), spray once into each nostril. If no response within 3 minutes, repeat application and call 911.    Dispense:  1 each    Refill:  0    Instruct patient in proper use of device.   Orders:  Orders Placed This Encounter  Procedures   ToxASSURE Select 13 (MW), Urine    Volume: 30 ml(s). Minimum 3 ml of urine is needed. Document temperature of fresh sample. Indications: Long term (current) use of opiate analgesic (Q03.474)    Order Specific Question:   Release to patient    Answer:   Immediate   Nursing Instructions:    1). STAT: UDS required today. 2). Make sure to document all opioids and benzodiazepines taken, including time of last intake. 3). If order is entered on a procedure day, make sure sample is obtained before any medications are administered.   Follow-up plan:   Return in about 13 weeks (around 05/30/2023) for Eval-day (M,W), (F2F), (MM).      Interventional Therapies  Risk Factors  Considerations:  NOTE: NO RFA until BMI <35    Planned  Pending:      Under consideration:   Therapeutic/palliative bilateral lumbar facet MBB #3  Possible bilateral lumbar facet RFA #1    Completed:   Diagnostic bilateral lumbar facet MBB x2 (07/23/2018) (100/100/100/0)    Completed by other providers:   None at this time   Therapeutic  Palliative (PRN) options:   Palliative bilateral lumbar facet block    Pharmacotherapy  Nonopioids transfer 08/11/2020: Gabapentin,  meloxicam, and Zanaflex       Recent Visits No visits were found meeting these conditions. Showing recent visits within past 90 days and meeting all other requirements Today's Visits Date Type Provider Dept  02/28/23 Office Visit Delano Metz, MD [facility]  Showing today's visits and meeting all other requirements Future Appointments No visits were  found meeting these conditions. Showing future appointments within next 90 days and meeting all other requirements  I discussed the assessment and treatment plan with the patient. The patient was provided an opportunity to ask questions and all were answered. The patient agreed with the plan and demonstrated an understanding of the instructions.  Patient advised to call back or seek an in-person evaluation if the symptoms or condition worsens.  Duration of encounter: 30 minutes.  Total time on encounter, as per AMA guidelines included both the face-to-face and non-face-to-face time personally spent by the physician and/or other qualified health care professional(s) on the day of the encounter (includes time in activities that require the physician or other qualified health care professional and does not include time in activities normally performed by clinical staff). Physician's time may include the following activities when performed: Preparing to see the patient (e.g., pre-charting review of records, searching for previously ordered imaging, lab work, and nerve conduction tests) Review of prior analgesic pharmacotherapies. Reviewing PMP Interpreting ordered tests (e.g., lab work, imaging, nerve conduction tests) Performing post-procedure evaluations, including interpretation of diagnostic procedures Obtaining and/or reviewing separately obtained history Performing a medically appropriate examination and/or evaluation Counseling and educating the patient/family/caregiver Ordering medications, tests, or  procedures Referring and communicating with other health care professionals (when not separately reported) Documenting clinical information in the electronic or other health record Independently interpreting results (not separately reported) and communicating results to the patient/ family/caregiver Care coordination (not separately reported)  Note by: Oswaldo Done, MD Date: 02/28/2023; Time: 12:28 PM

## 2023-02-28 NOTE — Patient Instructions (Signed)
____________________________________________________________________________________________  Opioid Pain Medication Update  To: All patients taking opioid pain medications. (I.e.: hydrocodone, hydromorphone, oxycodone, oxymorphone, morphine, codeine, methadone, tapentadol, tramadol, buprenorphine, fentanyl, etc.)  Re: Updated review of side effects and adverse reactions of opioid analgesics, as well as new information about long term effects of this class of medications.  Direct risks of long-term opioid therapy are not limited to opioid addiction and overdose. Potential medical risks include serious fractures, breathing problems during sleep, hyperalgesia, immunosuppression, chronic constipation, bowel obstruction, myocardial infarction, and tooth decay secondary to xerostomia.  Unpredictable adverse effects that can occur even if you take your medication correctly: Cognitive impairment, respiratory depression, and death. Most people think that if they take their medication "correctly", and "as instructed", that they will be safe. Nothing could be farther from the truth. In reality, a significant amount of recorded deaths associated with the use of opioids has occurred in individuals that had taken the medication for a long time, and were taking their medication correctly. The following are examples of how this can happen: Patient taking his/her medication for a long time, as instructed, without any side effects, is given a certain antibiotic or another unrelated medication, which in turn triggers a "Drug-to-drug interaction" leading to disorientation, cognitive impairment, impaired reflexes, respiratory depression or an untoward event leading to serious bodily harm or injury, including death.  Patient taking his/her medication for a long time, as instructed, without any side effects, develops an acute impairment of liver and/or kidney function. This will lead to a rapid inability of the body to  breakdown and eliminate their pain medication, which will result in effects similar to an "overdose", but with the same medicine and dose that they had always taken. This again may lead to disorientation, cognitive impairment, impaired reflexes, respiratory depression or an untoward event leading to serious bodily harm or injury, including death.  A similar problem will occur with patients as they grow older and their liver and kidney function begins to decrease as part of the aging process.  Background information: Historically, the original case for using long-term opioid therapy to treat chronic noncancer pain was based on safety assumptions that subsequent experience has called into question. In 1996, the American Pain Society and the American Academy of Pain Medicine issued a consensus statement supporting long-term opioid therapy. This statement acknowledged the dangers of opioid prescribing but concluded that the risk for addiction was low; respiratory depression induced by opioids was short-lived, occurred mainly in opioid-naive patients, and was antagonized by pain; tolerance was not a common problem; and efforts to control diversion should not constrain opioid prescribing. This has now proven to be wrong. Experience regarding the risks for opioid addiction, misuse, and overdose in community practice has failed to support these assumptions.  According to the Centers for Disease Control and Prevention, fatal overdoses involving opioid analgesics have increased sharply over the past decade. Currently, more than 96,700 people die from drug overdoses every year. Opioids are a factor in 7 out of every 10 overdose deaths. Deaths from drug overdose have surpassed motor vehicle accidents as the leading cause of death for individuals between the ages of 35 and 54.  Clinical data suggest that neuroendocrine dysfunction may be very common in both men and women, potentially causing hypogonadism, erectile  dysfunction, infertility, decreased libido, osteoporosis, and depression. Recent studies linked higher opioid dose to increased opioid-related mortality. Controlled observational studies reported that long-term opioid therapy may be associated with increased risk for cardiovascular events. Subsequent meta-analysis concluded   that the safety of long-term opioid therapy in elderly patients has not been proven.   Side Effects and adverse reactions: Common side effects: Drowsiness (sedation). Dizziness. Nausea and vomiting. Constipation. Physical dependence -- Dependence often manifests with withdrawal symptoms when opioids are discontinued or decreased. Tolerance -- As you take repeated doses of opioids, you require increased medication to experience the same effect of pain relief. Respiratory depression -- This can occur in healthy people, especially with higher doses. However, people with COPD, asthma or other lung conditions may be even more susceptible to fatal respiratory impairment.  Uncommon side effects: An increased sensitivity to feeling pain and extreme response to pain (hyperalgesia). Chronic use of opioids can lead to this. Delayed gastric emptying (the process by which the contents of your stomach are moved into your small intestine). Muscle rigidity. Immune system and hormonal dysfunction. Quick, involuntary muscle jerks (myoclonus). Arrhythmia. Itchy skin (pruritus). Dry mouth (xerostomia).  Long-term side effects: Chronic constipation. Sleep-disordered breathing (SDB). Increased risk of bone fractures. Hypothalamic-pituitary-adrenal dysregulation. Increased risk of overdose.  RISKS: Respiratory depression and death: Opioids increase the risk of respiratory depression and death.  Drug-to-drug interactions: Opioids are relatively contraindicated in combination with benzodiazepines, sleep inducers, and other central nervous system depressants. Other classes of medications  (i.e.: certain antibiotics and even over-the-counter medications) may also trigger or induce respiratory depression in some patients.  Medical conditions: Patients with pre-existing respiratory problems are at higher risk of respiratory failure and/or depression when in combination with opioid analgesics. Opioids are relatively contraindicated in some medical conditions such as central sleep apnea.   Fractures and Falls:  Opioids increase the risk and incidence of falls. This is of particular importance in elderly patients.  Endocrine System:  Long-term administration is associated with endocrine abnormalities (endocrinopathies). (Also known as Opioid-induced Endocrinopathy) Influences on both the hypothalamic-pituitary-adrenal axis?and the hypothalamic-pituitary-gonadal axis have been demonstrated with consequent hypogonadism and adrenal insufficiency in both sexes. Hypogonadism and decreased levels of dehydroepiandrosterone sulfate have been reported in men and women. Endocrine effects include: Amenorrhoea in women (abnormal absence of menstruation) Reduced libido in both sexes Decreased sexual function Erectile dysfunction in men Hypogonadisms (decreased testicular function with shrinkage of testicles) Infertility Depression and fatigue Loss of muscle mass Anxiety Depression Immune suppression Hyperalgesia Weight gain Anemia Osteoporosis Patients (particularly women of childbearing age) should avoid opioids. There is insufficient evidence to recommend routine monitoring of asymptomatic patients taking opioids in the long-term for hormonal deficiencies.  Immune System: Human studies have demonstrated that opioids have an immunomodulating effect. These effects are mediated via opioid receptors both on immune effector cells and in the central nervous system. Opioids have been demonstrated to have adverse effects on antimicrobial response and anti-tumour surveillance. Buprenorphine has  been demonstrated to have no impact on immune function.  Opioid Induced Hyperalgesia: Human studies have demonstrated that prolonged use of opioids can lead to a state of abnormal pain sensitivity, sometimes called opioid induced hyperalgesia (OIH). Opioid induced hyperalgesia is not usually seen in the absence of tolerance to opioid analgesia. Clinically, hyperalgesia may be diagnosed if the patient on long-term opioid therapy presents with increased pain. This might be qualitatively and anatomically distinct from pain related to disease progression or to breakthrough pain resulting from development of opioid tolerance. Pain associated with hyperalgesia tends to be more diffuse than the pre-existing pain and less defined in quality. Management of opioid induced hyperalgesia requires opioid dose reduction.  Cancer: Chronic opioid therapy has been associated with an increased risk of cancer   among noncancer patients with chronic pain. This association was more evident in chronic strong opioid users. Chronic opioid consumption causes significant pathological changes in the small intestine and colon. Epidemiological studies have found that there is a link between opium dependence and initiation of gastrointestinal cancers. Cancer is the second leading cause of death after cardiovascular disease. Chronic use of opioids can cause multiple conditions such as GERD, immunosuppression and renal damage as well as carcinogenic effects, which are associated with the incidence of cancers.   Mortality: Long-term opioid use has been associated with increased mortality among patients with chronic non-cancer pain (CNCP).  Prescription of long-acting opioids for chronic noncancer pain was associated with a significantly increased risk of all-cause mortality, including deaths from causes other than overdose.  Reference: Von Korff M, Kolodny A, Deyo RA, Chou R. Long-term opioid therapy reconsidered. Ann Intern Med. 2011  Sep 6;155(5):325-8. doi: 10.7326/[id number]. PMID: 21893626; PMCID: PMC3280085. Bedson J, Chen Y, Ashworth J, Hayward RA, Dunn KM, Jordan KP. Risk of adverse events in patients prescribed long-term opioids: A cohort study in the [place] Clinical Practice Research Datalink. Eur J Pain. 2019 May;23(5):908-922. doi: 10.1002/ejp.1357. Epub 2019 Jan 31. PMID: 30620116. Colameco S, Coren JS, Ciervo CA. Continuous opioid treatment for chronic noncancer pain: a time for moderation in prescribing. Postgrad Med. 2009 Jul;121(4):61-6. doi: 10.3810/pgm.2009.07.2032. PMID: 19641271. Chou R, Turner JA, Devine EB, Hansen RN, Sullivan SD, Blazina I, Dana T, Bougatsos C, Deyo RA. The effectiveness and risks of long-term opioid therapy for chronic pain: a systematic review for a National Institutes of Health Pathways to Prevention Workshop. Ann Intern Med. 2015 Feb 17;162(4):276-86. doi: 10.7326/M14-2559. PMID: 25581257. Warner M, Chen LH, Makuc DM. NCHS Data Brief No. 22. [place]: Centers for Disease Control and Prevention; 2009. Sep, Increase in Fatal Poisonings Involving Opioid Analgesics in the [place], 1999-2006. Song IA, Choi HR, Oh TK. Long-term opioid use and mortality in patients with chronic non-cancer pain: Ten-year follow-up study in [place] from 2010 through 2019. EClinicalMedicine. 2022 Jul 18;51:101558. doi: 10.1016/j.eclinm.2022.101558. PMID: 35875817; PMCID: PMC9304910. Huser, W., Schubert, T., Vogelmann, T. et al. All-cause mortality in patients with long-term opioid therapy compared with non-opioid analgesics for chronic non-cancer pain: a database study. BMC Med 18, 162 (2020). https://doi.org/10.1186/[email or web address] Rashidian H, Zendehdel K, Kamangar F, Malekzadeh R, Haghdoost AA. An Ecological Study of the Association between Opiate Use and Incidence of Cancers. Addict Health. 2016 Fall;8(4):252-260. PMID: 28819556; PMCID: PMC5554805.  Our Goal: Our goal is to control your  pain with means other than the use of opioid pain medications.  Our Recommendation: Talk to your physician about coming off of these medications. We can assist you with the tapering down and stopping these medicines. Based on the new information, even if you cannot completely stop the medication, a decrease in the dose may be associated with a lesser risk. Ask for other means of controlling the pain. Decrease or eliminate those factors that significantly contribute to your pain such as smoking, obesity, and a diet heavily tilted towards "inflammatory" nutrients.  Last Updated: 02/19/2023   ____________________________________________________________________________________________     ____________________________________________________________________________________________  Transfer of Pain Medication between Pharmacies  Re: 2023 DEA Clarification on existing regulation  Published on DEA Website: April 14, 2022  Title: Revised Regulation Allows DEA-Registered Pharmacies to Transfer Electronic Prescriptions at a Patient's Request DEA Headquarters Division - Public Information Office  "Patients now have the ability to request their electronic prescription be transferred to another pharmacy without having to go back to their practitioner to initiate the   request. This revised regulation went into effect on Monday, April 10, 2022.     At a patient's request, a DEA-registered retail pharmacy can now transfer an electronic prescription for a controlled substance (schedules II-V) to another DEA-registered retail pharmacy. Prior to this change, patients would have to go through their practitioner to cancel their prescription and have it re-issued to a different pharmacy. The process was taxing and time consuming for both patients and practitioners.    The Drug Enforcement Administration (DEA) published its intent to revise the process for transferring electronic prescriptions on July 02, 2020.  The final rule was published in the federal register on March 09, 2022 and went into effect 30 days later.  Under the final rule, a prescription can only be transferred once between pharmacies, and only if allowed under existing state or other applicable law. The prescription must remain in its electronic form; may not be altered in any way; and the transfer must be communicated directly between two licensed pharmacists. It's important to note, any authorized refills transfer with the original prescription, which means the entire prescription will be filled at the same pharmacy."    REFERENCES: 1. DEA website announcement https://www.dea.gov/stories/2023/2023-04/2022-09-01/revised-regulation-allows-dea-registered-pharmacies-transfer  2. Department of Justice website  https://www.govinfo.gov/content/pkg/FR-2022-03-09/pdf/2023-15847.pdf  3. DEPARTMENT OF JUSTICE Drug Enforcement Administration 21 CFR Part 1306 [Docket No. DEA-637] RIN [id number] "Transfer of Electronic Prescriptions for Schedules II-V Controlled Substances Between Pharmacies for Initial Filling"  ____________________________________________________________________________________________     _______________________________________________________________________  Medication Rules  Purpose: To inform patients, and their family members, of our medication rules and regulations.  Applies to: All patients receiving prescriptions from our practice (written or electronic).  Pharmacy of record: This is the pharmacy where your electronic prescriptions will be sent. Make sure we have the correct one.  Electronic prescriptions: In compliance with the [place] Strengthen Opioid Misuse Prevention (STOP) Act of 2017 (Session Law 2017-74/H243), effective August 14, 2018, all controlled substances must be electronically prescribed. Written prescriptions, faxing, or calling prescriptions to a pharmacy will no longer be  done.  Prescription refills: These will be provided only during in-person appointments. No medications will be renewed without a "face-to-face" evaluation with your provider. Applies to all prescriptions.  NOTE: The following applies primarily to controlled substances (Opioid* Pain Medications).   Type of encounter (visit): For patients receiving controlled substances, face-to-face visits are required. (Not an option and not up to the patient.)  Patient's responsibilities: Pain Pills: Bring all pain pills to every appointment (except for procedure appointments). Pill Bottles: Bring pills in original pharmacy bottle. Bring bottle, even if empty. Always bring the bottle of the most recent fill.  Medication refills: You are responsible for knowing and keeping track of what medications you are taking and when is it that you will need a refill. The day before your appointment: write a list of all prescriptions that need to be refilled. The day of the appointment: give the list to the admitting nurse. Prescriptions will be written only during appointments. No prescriptions will be written on procedure days. If you forget a medication: it will not be "Called in", "Faxed", or "electronically sent". You will need to get another appointment to get these prescribed. No early refills. Do not call asking to have your prescription filled early. Partial  or short prescriptions: Occasionally your pharmacy may not have enough pills to fill your prescription.  NEVER ACCEPT a partial fill or a prescription that is short of the total amount of pills that you were prescribed.    With controlled substances the law allows 72 hours for the pharmacy to complete the prescription.  If the prescription is not completed within 72 hours, the pharmacist will require a new prescription to be written. This means that you will be short on your medicine and we WILL NOT send another prescription to complete your original prescription.   Instead, request the pharmacy to send a carrier to a nearby branch to get enough medication to provide you with your full prescription. Prescription Accuracy: You are responsible for carefully inspecting your prescriptions before leaving our office. Have the discharge nurse carefully go over each prescription with you, before taking them home. Make sure that your name is accurately spelled, that your address is correct. Check the name and dose of your medication to make sure it is accurate. Check the number of pills, and the written instructions to make sure they are clear and accurate. Make sure that you are given enough medication to last until your next medication refill appointment. Taking Medication: Take medication as prescribed. When it comes to controlled substances, taking less pills or less frequently than prescribed is permitted and encouraged. Never take more pills than instructed. Never take the medication more frequently than prescribed.  Inform other Doctors: Always inform, all of your healthcare providers, of all the medications you take. Pain Medication from other Providers: You are not allowed to accept any additional pain medication from any other Doctor or Healthcare provider. There are two exceptions to this rule. (see below) In the event that you require additional pain medication, you are responsible for notifying us, as stated below. Cough Medicine: Often these contain an opioid, such as codeine or hydrocodone. Never accept or take cough medicine containing these opioids if you are already taking an opioid* medication. The combination may cause respiratory failure and death. Medication Agreement: You are responsible for carefully reading and following our Medication Agreement. This must be signed before receiving any prescriptions from our practice. Safely store a copy of your signed Agreement. Violations to the Agreement will result in no further prescriptions. (Additional copies of  our Medication Agreement are available upon request.) Laws, Rules, & Regulations: All patients are expected to follow all Federal and State Laws, Statutes, Rules, & Regulations. Ignorance of the Laws does not constitute a valid excuse.  Illegal drugs and Controlled Substances: The use of illegal substances (including, but not limited to marijuana and its derivatives) and/or the illegal use of any controlled substances is strictly prohibited. Violation of this rule may result in the immediate and permanent discontinuation of any and all prescriptions being written by our practice. The use of any illegal substances is prohibited. Adopted CDC guidelines & recommendations: Target dosing levels will be at or below 60 MME/day. Use of benzodiazepines** is not recommended.  Exceptions: There are only two exceptions to the rule of not receiving pain medications from other Healthcare Providers. Exception #1 (Emergencies): In the event of an emergency (i.e.: accident requiring emergency care), you are allowed to receive additional pain medication. However, you are responsible for: As soon as you are able, call our office [phone], at any time of the day or night, and leave a message stating your name, the date and nature of the emergency, and the name and dose of the medication prescribed. In the event that your call is answered by a member of our staff, make sure to document and save the date, time, and the name of the person that took your information.  Exception #2 (  Planned Surgery): In the event that you are scheduled by another doctor or dentist to have any type of surgery or procedure, you are allowed (for a period no longer than 30 days), to receive additional pain medication, for the acute post-op pain. However, in this case, you are responsible for picking up a copy of our "Post-op Pain Management for Surgeons" handout, and giving it to your surgeon or dentist. This document is available at our office,  and does not require an appointment to obtain it. Simply go to our office during business hours (Monday-Thursday from 8:00 AM to 4:00 PM) (Friday 8:00 AM to 12:00 Noon) or if you have a scheduled appointment with us, prior to your surgery, and ask for it by name. In addition, you are responsible for: calling our office [phone], at any time of the day or night, and leaving a message stating your name, name of your surgeon, type of surgery, and date of procedure or surgery. Failure to comply with your responsibilities may result in termination of therapy involving the controlled substances. Medication Agreement Violation. Following the above rules, including your responsibilities will help you in avoiding a Medication Agreement Violation ("Breaking your Pain Medication Contract").  Consequences:  Not following the above rules may result in permanent discontinuation of medication prescription therapy.  *Opioid medications include: morphine, codeine, oxycodone, oxymorphone, hydrocodone, hydromorphone, meperidine, tramadol, tapentadol, buprenorphine, fentanyl, methadone. **Benzodiazepine medications include: diazepam (Valium), alprazolam (Xanax), clonazepam (Klonopine), lorazepam (Ativan), clorazepate (Tranxene), chlordiazepoxide (Librium), estazolam (Prosom), oxazepam (Serax), temazepam (Restoril), triazolam (Halcion) (Last updated: 06/06/2022) ______________________________________________________________________    ______________________________________________________________________  Medication Recommendations and Reminders  Applies to: All patients receiving prescriptions (written and/or electronic).  Medication Rules & Regulations: You are responsible for reading, knowing, and following our "Medication Rules" document. These exist for your safety and that of others. They are not flexible and neither are we. Dismissing or ignoring them is an act of "non-compliance" that may result in  complete and irreversible termination of such medication therapy. For safety reasons, "non-compliance" will not be tolerated. As with the [place] fundamental legal principle of "ignorance of the law is no defense", we will accept no excuses for not having read and knowing the content of documents provided to you by our practice.  Pharmacy of record:  Definition: This is the pharmacy where your electronic prescriptions will be sent.  We do not endorse any particular pharmacy. It is up to you and your insurance to decide what pharmacy to use.  We do not restrict you in your choice of pharmacy. However, once we write for your prescriptions, we will NOT be re-sending more prescriptions to fix restricted supply problems created by your pharmacy, or your insurance.  The pharmacy listed in the electronic medical record should be the one where you want electronic prescriptions to be sent. If you choose to change pharmacy, simply notify our nursing staff. Changes will be made only during your regular appointments and not over the phone.  Recommendations: Keep all of your pain medications in a safe place, under lock and key, even if you live alone. We will NOT replace lost, stolen, or damaged medication. We do not accept "Police Reports" as proof of medications having been stolen. After you fill your prescription, take 1 week's worth of pills and put them away in a safe place. You should keep a separate, properly labeled bottle for this purpose. The remainder should be kept in the original bottle. Use this as your primary supply, until it runs out.   Once it's gone, then you know that you have 1 week's worth of medicine, and it is time to come in for a prescription refill. If you do this correctly, it is unlikely that you will ever run out of medicine. To make sure that the above recommendation works, it is very important that you make sure your medication refill appointments are scheduled at least 1 week before you  run out of medicine. To do this in an effective manner, make sure that you do not leave the office without scheduling your next medication management appointment. Always ask the nursing staff to show you in your prescription , when your medication will be running out. Then arrange for the receptionist to get you a return appointment, at least 7 days before you run out of medicine. Do not wait until you have 1 or 2 pills left, to come in. This is very poor planning and does not take into consideration that we may need to cancel appointments due to bad weather, sickness, or emergencies affecting our staff. DO NOT ACCEPT A "Partial Fill": If for any reason your pharmacy does not have enough pills/tablets to completely fill or refill your prescription, do not allow for a "partial fill". The law allows the pharmacy to complete that prescription within 72 hours, without requiring a new prescription. If they do not fill the rest of your prescription within those 72 hours, you will need a separate prescription to fill the remaining amount, which we will NOT provide. If the reason for the partial fill is your insurance, you will need to talk to the pharmacist about payment alternatives for the remaining tablets, but again, DO NOT ACCEPT A PARTIAL FILL, unless you can trust your pharmacist to obtain the remainder of the pills within 72 hours.  Prescription refills and/or changes in medication(s):  Prescription refills, and/or changes in dose or medication, will be conducted only during scheduled medication management appointments. (Applies to both, written and electronic prescriptions.) No refills on procedure days. No medication will be changed or started on procedure days. No changes, adjustments, and/or refills will be conducted on a procedure day. Doing so will interfere with the diagnostic portion of the procedure. No phone refills. No medications will be "called into the pharmacy". No Fax refills. No weekend  refills. No Holliday refills. No after hours refills.  Remember:  Business hours are:  Monday to Thursday 8:00 AM to 4:00 PM Provider's Schedule: Francisco Naveira, MD - Appointments are:  Medication management: Monday and Wednesday 8:00 AM to 4:00 PM Procedure day: Tuesday and Thursday 7:30 AM to 4:00 PM Bilal Lateef, MD - Appointments are:  Medication management: Tuesday and Thursday 8:00 AM to 4:00 PM Procedure day: Monday and Wednesday 7:30 AM to 4:00 PM (Last update: 06/06/2022) ______________________________________________________________________   ____________________________________________________________________________________________  Naloxone Nasal Spray  Why am I receiving this medication? [place] STOP ACT requires that all patients taking high dose opioids or at risk of opioids respiratory depression, be prescribed an opioid reversal agent, such as Naloxone (AKA: Narcan).  What is this medication? NALOXONE (nal OX one) treats opioid overdose, which causes slow or shallow breathing, severe drowsiness, or trouble staying awake. Call emergency services after using this medication. You may need additional treatment. Naloxone works by reversing the effects of opioids. It belongs to a group of medications called opioid blockers.  COMMON BRAND NAME(S): Kloxxado, Narcan  What should I tell my care team before I take this medication? They need to know if you have   any of these conditions: Heart disease Substance use disorder An unusual or allergic reaction to naloxone, other medications, foods, dyes, or preservatives Pregnant or trying to get pregnant Breast-feeding  When to use this medication? This medication is to be used for the treatment of respiratory depression (less than 8 breaths per minute) secondary to opioid overdose.   How to use this medication? This medication is for use in the nose. Lay the person on their back. Support their neck with your hand  and allow the head to tilt back before giving the medication. The nasal spray should be given into 1 nostril. After giving the medication, move the person onto their side. Do not remove or test the nasal spray until ready to use. Get emergency medical help right away after giving the first dose of this medication, even if the person wakes up. You should be familiar with how to recognize the signs and symptoms of a narcotic overdose. If more doses are needed, give the additional dose in the other nostril. Talk to your care team about the use of this medication in children. While this medication may be prescribed for children as young as newborns for selected conditions, precautions do apply.  Naloxone Overdosage: If you think you have taken too much of this medicine contact a poison control center or emergency room at once.  NOTE: This medicine is only for you. Do not share this medicine with others.  What if I miss a dose? This does not apply.  What may interact with this medication? This is only used during an emergency. No interactions are expected during emergency use. This list may not describe all possible interactions. Give your health care provider a list of all the medicines, herbs, non-prescription drugs, or dietary supplements you use. Also tell them if you smoke, drink alcohol, or use illegal drugs. Some items may interact with your medicine.  What should I watch for while using this medication? Keep this medication ready for use in the case of an opioid overdose. Make sure that you have the phone number of your care team and local hospital ready. You may need to have additional doses of this medication. Each nasal spray contains a single dose. Some emergencies may require additional doses. After use, bring the treated person to the nearest hospital or call 911. Make sure the treating care team knows that the person has received a dose of this medication. You will receive additional  instructions on what to do during and after use of this medication before an emergency occurs.  What side effects may I notice from receiving this medication? Side effects that you should report to your care team as soon as possible: Allergic reactions--skin rash, itching, hives, swelling of the face, lips, tongue, or throat Side effects that usually do not require medical attention (report these to your care team if they continue or are bothersome): Constipation Dryness or irritation inside the nose Headache Increase in blood pressure Muscle spasms Stuffy nose Toothache This list may not describe all possible side effects. Call your doctor for medical advice about side effects. You may report side effects to FDA at 1-800-FDA-1088.  Where should I keep my medication? Because this is an emergency medication, you should keep it with you at all times.  Keep out of the reach of children and pets. Store between 20 and 25 degrees C (68 and 77 degrees F). Do not freeze. Throw away any unused medication after the expiration date. Keep in original box   until ready to use.  NOTE: This sheet is a summary. It may not cover all possible information. If you have questions about this medicine, talk to your doctor, pharmacist, or health care provider.   2023 Elsevier/Gold Standard (2021-04-08 00:00:00)  ____________________________________________________________________________________________   

## 2023-03-02 LAB — TOXASSURE SELECT 13 (MW), URINE

## 2023-03-12 ENCOUNTER — Other Ambulatory Visit: Payer: Self-pay

## 2023-03-12 ENCOUNTER — Emergency Department
Admission: EM | Admit: 2023-03-12 | Discharge: 2023-03-12 | Disposition: A | Discharge status: Home / Self Care | Payer: Medicare HMO | Attending: Emergency Medicine | Admitting: Emergency Medicine

## 2023-03-12 ENCOUNTER — Emergency Department: Payer: Medicare HMO

## 2023-03-12 DIAGNOSIS — S161XXA Strain of muscle, fascia and tendon at neck level, initial encounter: Secondary | ICD-10-CM | POA: Insufficient documentation

## 2023-03-12 DIAGNOSIS — I10 Essential (primary) hypertension: Secondary | ICD-10-CM | POA: Diagnosis not present

## 2023-03-12 DIAGNOSIS — Y92410 Unspecified street and highway as the place of occurrence of the external cause: Secondary | ICD-10-CM | POA: Insufficient documentation

## 2023-03-12 DIAGNOSIS — G44309 Post-traumatic headache, unspecified, not intractable: Secondary | ICD-10-CM | POA: Insufficient documentation

## 2023-03-12 DIAGNOSIS — E039 Hypothyroidism, unspecified: Secondary | ICD-10-CM | POA: Diagnosis not present

## 2023-03-12 DIAGNOSIS — M542 Cervicalgia: Secondary | ICD-10-CM | POA: Diagnosis present

## 2023-03-12 DIAGNOSIS — Z041 Encounter for examination and observation following transport accident: Secondary | ICD-10-CM | POA: Diagnosis not present

## 2023-03-12 NOTE — Discharge Instructions (Signed)
Take your medications as prescribed.  See Primary Care if not improving over the week.  Return to the ER for symptoms that change or worsen if unable to schedule an appointment.
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
## 2023-03-14 ENCOUNTER — Telehealth: Payer: Self-pay

## 2023-03-14 NOTE — Telephone Encounter (Signed)
Okay I will check her then 
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
## 2023-03-15 ENCOUNTER — Ambulatory Visit (INDEPENDENT_AMBULATORY_CARE_PROVIDER_SITE_OTHER): Payer: Medicare HMO | Admitting: Internal Medicine

## 2023-03-15 ENCOUNTER — Encounter: Payer: Self-pay | Admitting: Internal Medicine

## 2023-03-15 VITALS — BP 116/80 | HR 71 | Temp 97.1°F | Ht 63.0 in | Wt 237.0 lb

## 2023-03-15 DIAGNOSIS — M25511 Pain in right shoulder: Secondary | ICD-10-CM | POA: Diagnosis not present
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
## 2023-03-16 ENCOUNTER — Telehealth: Payer: Self-pay

## 2023-03-16 NOTE — Telephone Encounter (Signed)
Transition Care Management Follow-up Telephone Call Date of discharge and from where: 03/12/2023 [facility] How have you been since you were released from the hospital? Patient stated that her shoulder is still swollen and sore but getting better. Any questions or concerns? No  Items Reviewed: Did the pt receive and understand the discharge instructions provided? Yes  Medications obtained and verified?  No medication prescribed. Other? No  Any new allergies since your discharge? No  Dietary orders reviewed? Yes Do you have support at home? Yes   Follow up appointments reviewed:  PCP Hospital f/u appt confirmed? Yes  Scheduled to see Tillman Abide, MD on 03/15/2023 @ [facility] [place]. Specialist Hospital f/u appt confirmed? No  Scheduled to see  on  @ . Are transportation arrangements needed? No  If their condition worsens, is the pt aware to call PCP or go to the Emergency Dept.? Yes Was the patient provided with contact information for the PCP's office or ED? Yes Was to pt encouraged to call back with questions or concerns? Yes  Jami Ohlin [facility]  [facility] Population Health Community Resource Care Guide   ??[email or web address]  ?? [phone]   Website: [email or web address]  [email or web address]

## 2023-03-26 ENCOUNTER — Other Ambulatory Visit: Payer: Self-pay | Admitting: Family Medicine

## 2023-03-26 DIAGNOSIS — M159 Polyosteoarthritis, unspecified: Secondary | ICD-10-CM

## 2023-03-26 NOTE — Telephone Encounter (Signed)
Last filled on 09/28/22 #90 tabs/ 1 refill, last OV was an ER f/u with Dr. Alphonsus Sias on 03/15/23

## 2023-03-26 NOTE — Telephone Encounter (Signed)
Please schedule follow up with me in October for DM and other chronic health problems

## 2023-03-27 NOTE — Telephone Encounter (Signed)
Patient has been scheduled

## 2023-04-12 DIAGNOSIS — R748 Abnormal levels of other serum enzymes: Secondary | ICD-10-CM | POA: Diagnosis not present

## 2023-04-12 DIAGNOSIS — K760 Fatty (change of) liver, not elsewhere classified: Secondary | ICD-10-CM | POA: Diagnosis not present

## 2023-04-12 DIAGNOSIS — K754 Autoimmune hepatitis: Secondary | ICD-10-CM | POA: Diagnosis not present

## 2023-04-12 DIAGNOSIS — Z1159 Encounter for screening for other viral diseases: Secondary | ICD-10-CM | POA: Diagnosis not present

## 2023-04-27 DIAGNOSIS — K7581 Nonalcoholic steatohepatitis (NASH): Secondary | ICD-10-CM | POA: Diagnosis not present

## 2023-05-08 ENCOUNTER — Ambulatory Visit: Payer: Medicare HMO | Admitting: Internal Medicine

## 2023-05-12 ENCOUNTER — Other Ambulatory Visit: Payer: Self-pay | Admitting: Family Medicine

## 2023-05-12 DIAGNOSIS — M62838 Other muscle spasm: Secondary | ICD-10-CM

## 2023-05-12 DIAGNOSIS — G8929 Other chronic pain: Secondary | ICD-10-CM

## 2023-05-13 ENCOUNTER — Other Ambulatory Visit: Payer: Self-pay | Admitting: Family Medicine

## 2023-05-13 DIAGNOSIS — M62838 Other muscle spasm: Secondary | ICD-10-CM

## 2023-05-13 DIAGNOSIS — G8929 Other chronic pain: Secondary | ICD-10-CM

## 2023-05-14 NOTE — Telephone Encounter (Signed)
Last filled on 11/27/22 #270 tab/ 1 refill, DM f/u is scheduled on 05/28/23

## 2023-05-21 ENCOUNTER — Encounter: Payer: Self-pay | Admitting: Podiatry

## 2023-05-21 ENCOUNTER — Ambulatory Visit (INDEPENDENT_AMBULATORY_CARE_PROVIDER_SITE_OTHER): Payer: Medicare HMO | Admitting: Podiatry

## 2023-05-21 DIAGNOSIS — B351 Tinea unguium: Secondary | ICD-10-CM

## 2023-05-21 DIAGNOSIS — M79674 Pain in right toe(s): Secondary | ICD-10-CM

## 2023-05-21 DIAGNOSIS — E119 Type 2 diabetes mellitus without complications: Secondary | ICD-10-CM | POA: Diagnosis not present

## 2023-05-21 DIAGNOSIS — M79675 Pain in left toe(s): Secondary | ICD-10-CM | POA: Diagnosis not present

## 2023-05-21 DIAGNOSIS — M2141 Flat foot [pes planus] (acquired), right foot: Secondary | ICD-10-CM

## 2023-05-21 DIAGNOSIS — M2142 Flat foot [pes planus] (acquired), left foot: Secondary | ICD-10-CM

## 2023-05-21 NOTE — Progress Notes (Signed)
ANNUAL DIABETIC FOOT EXAM  Subjective: Teresa Mcgee presents today annual diabetic foot exam and painful elongated mycotic toenails 1-5 bilaterally which are tender when wearing enclosed shoe gear. Pain is relieved with periodic professional debridement.  Patient confirms h/o diabetes.  Patient denies any h/o foot wounds.  Patient has been diagnosed with neuropathy.  Tower, Teresa Gallus, MD is patient's PCP. LOV 12/01/2022.  Past Medical History:  Diagnosis Date   Arthritis    Bell's palsy may 2012   Chest pain    Diabetes mellitus without complication (HCC)    GERD (gastroesophageal reflux disease)    H/O hiatal hernia    Hyperlipidemia    Hypertension    Hypothyroidism    Shingles    Shortness of breath    Sleep apnea    uses cpap   Patient Active Problem List   Diagnosis Date Noted   Right shoulder pain 03/15/2023   Hypomagnesemia 02/21/2022   Hypokalemia 02/21/2022   Fatty liver 02/06/2022   Allergic conjunctivitis 12/01/2021   Elevated LFTs 11/22/2021   Medication monitoring encounter 11/22/2021   Fungal dermatitis 11/22/2021   Routine general medical examination at a health care facility 08/19/2021   Encounter for screening mammogram for breast cancer 08/19/2021   Chronic use of opiate for therapeutic purpose 11/04/2020   Uncomplicated opioid dependence (HCC) 11/04/2020   Ragged cuticle 09/23/2020   Type 2 diabetes mellitus without complication, without long-term current use of insulin (HCC) 03/30/2019   Pain due to onychomycosis of toenails of both feet 03/03/2019   Morbid obesity with BMI of 45.0-49.9, adult (HCC) 09/16/2018   Cervicalgia 04/22/2018   Chronic musculoskeletal pain 04/22/2018   DDD (degenerative disc disease), thoracic 04/22/2018   DDD (degenerative disc disease), cervical 04/22/2018   Lumbar facet arthropathy (Bilateral) 04/22/2018   Lumbar facet syndrome (Bilateral) 04/22/2018   Chronic low back pain (1ry area of Pain) (Bilateral) (L>R) w/o  sciatica 04/22/2018   Spondylosis without myelopathy or radiculopathy, cervical region 04/22/2018   Spondylosis without myelopathy or radiculopathy, lumbosacral region 04/22/2018   DDD (degenerative disc disease), lumbosacral 04/22/2018   Strain of lumbar paraspinal muscle, sequela 04/22/2018   Chronic hip pain (Bilateral) (R>L) 04/22/2018   Chronic sacroiliac joint pain (Left) 04/22/2018   Neurogenic pain 04/22/2018   Osteoarthritis involving multiple joints 04/22/2018   Chronic knee pain (Bilateral) (R>L) 04/22/2018   Chronic upper extremity pain (4th area of Pain) (Bilateral) (L>R) 02/27/2018   Vitamin D deficiency 02/27/2018   Chronic low back pain (Bilateral) (L>R) w/ sciatica (Bilateral) 01/24/2018   Chronic lower extremity pain (2ry area of Pain) (Bilateral) (L>R) 01/24/2018   Chronic neck pain (3ry area of Pain) (Bilateral) (L>R) 01/24/2018   Chronic pain syndrome 01/24/2018   Long term current use of opiate analgesic 01/24/2018   Disorder of skeletal system 01/24/2018   Nonallopathic lesion of head region 04/25/2012   Constipation 01/11/2012   Chest pain 01/11/2012   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   Numbness and tingling in both hands 08/25/2011   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   Muscle spasms of neck 05/23/2011   Nasal mucositis (ulcerative) 05/23/2011   Fatigue 03/21/2011   Tobacco abuse 05/24/2010   Hypothyroidism 01/25/2010   HYPERTENSION, BENIGN ESSENTIAL 01/25/2010   Hyperlipidemia associated with type 2 diabetes mellitus (HCC) 12/31/2009   DEPRESSION 12/31/2009   GERD 12/31/2009   OSA (obstructive sleep apnea) 12/31/2009   Past Surgical History:  Procedure Laterality Date   ABDOMINAL HYSTERECTOMY     CESAREAN SECTION  cesarian     3x   COLONOSCOPY WITH PROPOFOL N/A 11/27/2019   Procedure: COLONOSCOPY WITH PROPOFOL;  Surgeon: Wyline Mood, MD;  Location: [facility] ENDOSCOPY;  Service: Gastroenterology;  Laterality: N/A;   ESOPHAGOGASTRODUODENOSCOPY (EGD) WITH  PROPOFOL N/A 07/20/2020   Procedure: ESOPHAGOGASTRODUODENOSCOPY (EGD) WITH PROPOFOL;  Surgeon: Wyline Mood, MD;  Location: [facility] ENDOSCOPY;  Service: Gastroenterology;  Laterality: N/A;   PARTIAL HYSTERECTOMY  2000   abnormal uterine bleeding   THYROIDECTOMY  1998   TOOTH EXTRACTION     TUBAL LIGATION     Current Outpatient Medications on File Prior to Visit  Medication Sig Dispense Refill   Accu-Chek FastClix Lancets MISC USE UP TO 4 TIMES DAILY AS DIRECTED 102 each 1   ACCU-CHEK GUIDE test strip USE UP TO 4 TIMES DAILY AS DIRECTED 100 strip 5   albuterol (VENTOLIN HFA) 108 (90 Base) MCG/ACT inhaler INHALE 2 PUFFS INTO THE LUNGS EVERY 4 HOURS AS NEEDED FOR (COUGH, SHORTNESS OF BREATH OR WHEEZING) 8.5 each 2   amLODipine (NORVASC) 10 MG tablet TAKE 1 TABLET BY MOUTH EVERY DAY 90 tablet 2   azelastine (OPTIVAR) 0.05 % ophthalmic solution Place 1 drop into both eyes 2 (two) times daily as needed. 6 mL 3   blood glucose meter kit and supplies Dispense based on patient and insurance preference. Use up to four times daily as directed. (FOR ICD-10 E10.9, E11.9). 1 each 0   [employer] D3 125 MCG (5000 UT) capsule TAKE 1 CAPSULE BY MOUTH DAILY WITH BREAKFAST. TAKE ALONG WITH CALCIUM AND MAGNESIUM.  5   Evolocumab (REPATHA SURECLICK) 140 MG/ML SOAJ Inject 140 mg into the skin every 14 (fourteen) days. 6 mL 3   ezetimibe (ZETIA) 10 MG tablet TAKE 1 TABLET BY MOUTH EVERY DAY 90 tablet 2   fluticasone (FLONASE) 50 MCG/ACT nasal spray SPRAY 2 SPRAYS INTO EACH NOSTRIL EVERY DAY 48 mL 1   gabapentin (NEURONTIN) 100 MG capsule TAKE 1 CAPSULE (100 MG TOTAL) BY MOUTH 2 (TWO) TIMES DAILY AND 3 CAPSULES (300 MG TOTAL) AT BEDTIME. 150 capsule 5   hydrochlorothiazide (HYDRODIURIL) 25 MG tablet TAKE 1 TABLET (25 MG TOTAL) BY MOUTH DAILY. AS DIRECTED 90 tablet 1   levocetirizine (XYZAL) 5 MG tablet TAKE 1 TABLET BY MOUTH EVERY DAY IN THE EVENING 90 tablet 1   levothyroxine (SYNTHROID) 112 MCG tablet Take 1 tablet (112 mcg  total) by mouth daily before breakfast. 90 tablet 1   Magnesium Oxide -Mg Supplement 500 MG CAPS TAKE 1 CAPSULE BY MOUTH TWICE A DAY (AT 8AM & 10PM) 180 capsule 0   meloxicam (MOBIC) 15 MG tablet TAKE 1 TABLET BY MOUTH EVERY DAY WITH FOOD AS NEEDED FOR PAIN 90 tablet 0   metFORMIN (GLUCOPHAGE-XR) 500 MG 24 hr tablet Take 1 tablet (500 mg total) by mouth 2 (two) times daily with a meal. 180 tablet 2   naloxone (NARCAN) nasal spray 4 mg/0.1 mL Place 1 spray into the nose as needed for up to 365 doses (for opioid-induced respiratory depresssion). In case of emergency (overdose), spray once into each nostril. If no response within 3 minutes, repeat application and call 911. 1 each 0   Olopatadine HCl 0.2 % SOLN INSTILL 1 DROP INTO BOTH EYES EVERY DAY AS NEEDED     oxyCODONE (OXY IR/ROXICODONE) 5 MG immediate release tablet Take 1 tablet (5 mg total) by mouth every 8 (eight) hours as needed for severe pain. Must last 30 days 90 tablet 0   oxyCODONE (OXY IR/ROXICODONE)  5 MG immediate release tablet Take 1 tablet (5 mg total) by mouth every 8 (eight) hours as needed for severe pain. Must last 30 days 90 tablet 0   oxyCODONE (OXY IR/ROXICODONE) 5 MG immediate release tablet Take 1 tablet (5 mg total) by mouth every 8 (eight) hours as needed for severe pain. Must last 30 days 90 tablet 0   potassium chloride (KLOR-CON) 10 MEQ tablet TAKE 1 TABLET BY MOUTH EVERY DAY 90 tablet 1   REFRESH CELLUVISC 1 % GEL APPLY 1 DROP TO EYE 3 (THREE) TIMES DAILY.  12   tiZANidine (ZANAFLEX) 4 MG tablet Take 1 tablet (4 mg total) by mouth every 8 (eight) hours as needed for muscle spasms. 270 tablet 1   No current facility-administered medications on file prior to visit.    Allergies  Allergen Reactions   Statins     myopathy   Social History   Occupational History   Occupation: disabled    Associate Professor: [employer]    Comment: back  Tobacco Use   Smoking status: Every Day    Current packs/day: 0.50    Average  packs/day: 0.5 packs/day for 20.0 years (10.0 ttl pk-yrs)    Types: Cigarettes   Smokeless tobacco: Never   Tobacco comments:    2-3 cigs daily-09/22/2020  Vaping Use   Vaping status: Never Used  Substance and Sexual Activity   Alcohol use: No   Drug use: No   Sexual activity: Not Currently   Family History  Problem Relation Age of Onset   Heart attack Father 18       deceased   Diabetes Brother    Stroke Brother 25       twice   Stroke Sister 63   Diabetes Sister    Heart attack Sister 5   Heart attack Brother 39   Breast cancer Maternal Aunt    Immunization History  Administered Date(s) Administered   Hepb-cpg 07/24/2022, 08/24/2022   Influenza Split 05/23/2011, 04/24/2012   Influenza,inj,Quad PF,6+ Mos 06/11/2019   PFIZER(Purple Top)SARS-COV-2 Vaccination 11/12/2019, 12/03/2019, 08/09/2020   Tdap 04/25/2011     Review of Systems: Negative except as noted in the HPI.   Objective: There were no vitals filed for this visit.  MERIT GADSBY is a pleasant 57 y.o. female in NAD. AAO X 3.  Vascular Examination: CFT <3 seconds b/l. DP pulses faintly palpable b/l. PT pulses faintly palpable b/l. Digital hair absent. Skin temperature gradient warm to warm b/l. No pain with calf compression. No ischemia or gangrene. No cyanosis or clubbing noted b/l.    Neurological Examination: Sensation grossly intact b/l with 10 gram monofilament. Vibratory sensation intact b/l.   Dermatological Examination: Pedal skin warm and supple b/l. No open wounds b/l. No interdigital macerations. Toenails 1-5 b/l thick, discolored, elongated with subungual debris and pain on dorsal palpation.  No corns, calluses nor porokeratotic lesions noted.  Musculoskeletal Examination: Muscle strength 5/5 to all lower extremity muscle groups bilaterally. Pes planus deformity noted bilateral LE. Patient ambulates independent of any assistive aids.  Radiographs: None  Last HgA1c:      Latest Ref Rng &  Units 12/01/2022   11:50 AM  Hemoglobin A1C  Hemoglobin-A1c 4.6 - 6.5 % 6.5     ADA Risk Categorization: Low Risk :  Patient has all of the following: Intact protective sensation No prior foot ulcer  No severe deformity Pedal pulses present  Assessment: 1. Pain due to onychomycosis of toenails of both feet  2. Type 2 diabetes mellitus without complication, without long-term current use of insulin (HCC)   3. Pes planus of both feet   4. Encounter for diabetic foot exam (HCC)      Plan: -Consent given for treatment as described below: -Examined patient. -Diabetic foot examination performed today. -Continue supportive shoe gear daily. -Mycotic toenails 1-5 bilaterally were debrided in length and girth with sterile nail nippers and dremel without incident. -Patient/POA to call should there be question/concern in the interim. Return in about 3 months (around 08/21/2023).  Freddie Breech, DPM
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
## 2023-05-27 NOTE — Patient Instructions (Signed)
______________________________________________________________________    Patient Information update  To: All of our patients.  Re: Name change.  It has been made official that our current name, "[facility]"   will soon be changed to "[place] [facility]".   The purpose of this change is to eliminate any confusion created by the concept of our practice being a "Medication Management Pain Clinic". In the past this has led to the misconception that we treat pain primarily by the use of prescription medications.  Nothing can be farther from the truth.   Understanding PAIN MANAGEMENT: To further understand what our practice does, you first have to understand that "Pain Management" is a subspecialty that requires additional training once a physician has completed their specialty training, which can be in either Anesthesia, Neurology, Psychiatry, or Physical Medicine and Rehabilitation (PMR). Each one of these contributes to the final approach taken by each physician to the management of their patient's pain. To be a "Pain Management Specialist" you must have first completed one of the specialty trainings below.  Anesthesiologists - trained in clinical pharmacology and interventional techniques such as nerve blockade and regional as well as central neuroanatomy. They are trained to block pain before, during, and after surgical interventions.  Neurologists - trained in the diagnosis and pharmacological treatment of complex neurological conditions, such as Multiple Sclerosis, Parkinson's, spinal cord injuries, and other systemic conditions that may be associated with symptoms that may include but are not limited to pain. They tend to rely primarily on the treatment of chronic pain using prescription medications.  Psychiatrist - trained in conditions affecting the psychosocial wellbeing of patients  including but not limited to depression, anxiety, schizophrenia, personality disorders, addiction, and other substance use disorders that may be associated with chronic pain. They tend to rely primarily on the treatment of chronic pain using prescription medications.   Physical Medicine and Rehabilitation (PMR) physicians, also known as physiatrists - trained to treat a wide variety of medical conditions affecting the brain, spinal cord, nerves, bones, joints, ligaments, muscles, and tendons. Their training is primarily aimed at treating patients that have suffered injuries that have caused severe physical impairment. Their training is primarily aimed at the physical therapy and rehabilitation of those patients. They may also work alongside orthopedic surgeons or neurosurgeons using their expertise in assisting surgical patients to recover after their surgeries.  INTERVENTIONAL PAIN MANAGEMENT is sub-subspecialty of Pain Management.  Our physicians are Board-certified in Anesthesia, Pain Management, and Interventional Pain Management.  This meaning that not only have they been trained and Board-certified in their specialty of Anesthesia, and subspecialty of Pain Management, but they have also received further training in the sub-subspecialty of Interventional Pain Management, in order to become Board-certified as INTERVENTIONAL PAIN MANAGEMENT SPECIALIST.    Mission: Our goal is to use our skills in  INTERVENTIONAL PAIN MANAGEMENT as alternatives to the chronic use of prescription opioid medications for the treatment of pain. To make this more clear, we have changed our name to reflect what we do and offer. We will continue to offer medication management assessment and recommendations, but we will not be taking over any patient's medication management.  ______________________________________________________________________       ______________________________________________________________________     Opioid Pain Medication Update  To: All patients taking opioid pain medications. (I.e.: hydrocodone, hydromorphone, oxycodone, oxymorphone, morphine, codeine, methadone, tapentadol, tramadol, buprenorphine, fentanyl, etc.)  Re: Updated review of side effects and adverse reactions of  opioid analgesics, as well as new information about long term effects of this class of medications.  Direct risks of long-term opioid therapy are not limited to opioid addiction and overdose. Potential medical risks include serious fractures, breathing problems during sleep, hyperalgesia, immunosuppression, chronic constipation, bowel obstruction, myocardial infarction, and tooth decay secondary to xerostomia.  Unpredictable adverse effects that can occur even if you take your medication correctly: Cognitive impairment, respiratory depression, and death. Most people think that if they take their medication "correctly", and "as instructed", that they will be safe. Nothing could be farther from the truth. In reality, a significant amount of recorded deaths associated with the use of opioids has occurred in individuals that had taken the medication for a long time, and were taking their medication correctly. The following are examples of how this can happen: Patient taking his/her medication for a long time, as instructed, without any side effects, is given a certain antibiotic or another unrelated medication, which in turn triggers a "Drug-to-drug interaction" leading to disorientation, cognitive impairment, impaired reflexes, respiratory depression or an untoward event leading to serious bodily harm or injury, including death.  Patient taking his/her medication for a long time, as instructed, without any side effects, develops an acute impairment of liver and/or kidney function. This will lead to a rapid inability of the body to breakdown and eliminate their pain medication, which will result in effects similar to an  "overdose", but with the same medicine and dose that they had always taken. This again may lead to disorientation, cognitive impairment, impaired reflexes, respiratory depression or an untoward event leading to serious bodily harm or injury, including death.  A similar problem will occur with patients as they grow older and their liver and kidney function begins to decrease as part of the aging process.  Background information: Historically, the original case for using long-term opioid therapy to treat chronic noncancer pain was based on safety assumptions that subsequent experience has called into question. In 1996, the American Pain Society and the American Academy of Pain Medicine issued a consensus statement supporting long-term opioid therapy. This statement acknowledged the dangers of opioid prescribing but concluded that the risk for addiction was low; respiratory depression induced by opioids was short-lived, occurred mainly in opioid-naive patients, and was antagonized by pain; tolerance was not a common problem; and efforts to control diversion should not constrain opioid prescribing. This has now proven to be wrong. Experience regarding the risks for opioid addiction, misuse, and overdose in community practice has failed to support these assumptions.  According to the Centers for Disease Control and Prevention, fatal overdoses involving opioid analgesics have increased sharply over the past decade. Currently, more than 96,700 people die from drug overdoses every year. Opioids are a factor in 7 out of every 10 overdose deaths. Deaths from drug overdose have surpassed motor vehicle accidents as the leading cause of death for individuals between the ages of 19 and 23.  Clinical data suggest that neuroendocrine dysfunction may be very common in both men and women, potentially causing hypogonadism, erectile dysfunction, infertility, decreased libido, osteoporosis, and depression. Recent studies linked  higher opioid dose to increased opioid-related mortality. Controlled observational studies reported that long-term opioid therapy may be associated with increased risk for cardiovascular events. Subsequent meta-analysis concluded that the safety of long-term opioid therapy in elderly patients has not been proven.   Side Effects and adverse reactions: Common side effects: Drowsiness (sedation). Dizziness. Nausea and vomiting. Constipation. Physical dependence -- Dependence often manifests with  withdrawal symptoms when opioids are discontinued or decreased. Tolerance -- As you take repeated doses of opioids, you require increased medication to experience the same effect of pain relief. Respiratory depression -- This can occur in healthy people, especially with higher doses. However, people with COPD, asthma or other lung conditions may be even more susceptible to fatal respiratory impairment.  Uncommon side effects: An increased sensitivity to feeling pain and extreme response to pain (hyperalgesia). Chronic use of opioids can lead to this. Delayed gastric emptying (the process by which the contents of your stomach are moved into your small intestine). Muscle rigidity. Immune system and hormonal dysfunction. Quick, involuntary muscle jerks (myoclonus). Arrhythmia. Itchy skin (pruritus). Dry mouth (xerostomia).  Long-term side effects: Chronic constipation. Sleep-disordered breathing (SDB). Increased risk of bone fractures. Hypothalamic-pituitary-adrenal dysregulation. Increased risk of overdose.  RISKS: Respiratory depression and death: Opioids increase the risk of respiratory depression and death.  Drug-to-drug interactions: Opioids are relatively contraindicated in combination with benzodiazepines, sleep inducers, and other central nervous system depressants. Other classes of medications (i.e.: certain antibiotics and even over-the-counter medications) may also trigger or induce  respiratory depression in some patients.  Medical conditions: Patients with pre-existing respiratory problems are at higher risk of respiratory failure and/or depression when in combination with opioid analgesics. Opioids are relatively contraindicated in some medical conditions such as central sleep apnea.   Fractures and Falls:  Opioids increase the risk and incidence of falls. This is of particular importance in elderly patients.  Endocrine System:  Long-term administration is associated with endocrine abnormalities (endocrinopathies). (Also known as Opioid-induced Endocrinopathy) Influences on both the hypothalamic-pituitary-adrenal axis?and the hypothalamic-pituitary-gonadal axis have been demonstrated with consequent hypogonadism and adrenal insufficiency in both sexes. Hypogonadism and decreased levels of dehydroepiandrosterone sulfate have been reported in men and women. Endocrine effects include: Amenorrhoea in women (abnormal absence of menstruation) Reduced libido in both sexes Decreased sexual function Erectile dysfunction in men Hypogonadisms (decreased testicular function with shrinkage of testicles) Infertility Depression and fatigue Loss of muscle mass Anxiety Depression Immune suppression Hyperalgesia Weight gain Anemia Osteoporosis Patients (particularly women of childbearing age) should avoid opioids. There is insufficient evidence to recommend routine monitoring of asymptomatic patients taking opioids in the long-term for hormonal deficiencies.  Immune System: Human studies have demonstrated that opioids have an immunomodulating effect. These effects are mediated via opioid receptors both on immune effector cells and in the central nervous system. Opioids have been demonstrated to have adverse effects on antimicrobial response and anti-tumour surveillance. Buprenorphine has been demonstrated to have no impact on immune function.  Opioid Induced  Hyperalgesia: Human studies have demonstrated that prolonged use of opioids can lead to a state of abnormal pain sensitivity, sometimes called opioid induced hyperalgesia (OIH). Opioid induced hyperalgesia is not usually seen in the absence of tolerance to opioid analgesia. Clinically, hyperalgesia may be diagnosed if the patient on long-term opioid therapy presents with increased pain. This might be qualitatively and anatomically distinct from pain related to disease progression or to breakthrough pain resulting from development of opioid tolerance. Pain associated with hyperalgesia tends to be more diffuse than the pre-existing pain and less defined in quality. Management of opioid induced hyperalgesia requires opioid dose reduction.  Cancer: Chronic opioid therapy has been associated with an increased risk of cancer among noncancer patients with chronic pain. This association was more evident in chronic strong opioid users. Chronic opioid consumption causes significant pathological changes in the small intestine and colon. Epidemiological studies have found that there is a link  between opium dependence and initiation of gastrointestinal cancers. Cancer is the second leading cause of death after cardiovascular disease. Chronic use of opioids can cause multiple conditions such as GERD, immunosuppression and renal damage as well as carcinogenic effects, which are associated with the incidence of cancers.   Mortality: Long-term opioid use has been associated with increased mortality among patients with chronic non-cancer pain (CNCP).  Prescription of long-acting opioids for chronic noncancer pain was associated with a significantly increased risk of all-cause mortality, including deaths from causes other than overdose.  Reference: Von Korff M, Kolodny A, Deyo RA, Chou R. Long-term opioid therapy reconsidered. Ann Intern Med. 2011 Sep 6;155(5):325-8. doi: 10.7326/[id number]. PMID:  81191478; PMCID: [id number]. Randon Goldsmith, Hayward RA, Dunn KM, [place] KP. Risk of adverse events in patients prescribed long-term opioids: A cohort study in the [place] Clinical Practice Research Datalink. Eur J Pain. 2019 May;23(5):908-922. doi: 10.1002/ejp.1357. Epub 2019 Jan 31. PMID: 57846962. Colameco S, Coren JS, Ciervo CA. Continuous opioid treatment for chronic noncancer pain: a time for moderation in prescribing. Postgrad Med. 2009 Jul;121(4):61-6. doi: 10.3810/pgm.2009.07.2032. PMID: 95284132. William Hamburger RN, Tickfaw SD, Blazina I, Cristopher Peru, Bougatsos C, Deyo RA. The effectiveness and risks of long-term opioid therapy for chronic pain: a systematic review for a [employer]ways to [employer]. Ann Intern Med. 2015 Feb 17;162(4):276-86. doi: 10.7326/M14-2559. PMID: 44010272. Caryl Bis [facility], Makuc DM. NCHS Data Brief No. 22. [place]: Centers for Disease Control and Prevention; 2009. Sep, Increase in Fatal Poisonings Involving Opioid Analgesics in the [place], 1999-2006. Song IA, Choi HR, Oh TK. Long-term opioid use and mortality in patients with chronic non-cancer pain: Ten-year follow-up study in [place] from 2010 through 2019. EClinicalMedicine. 2022 Jul 18;51:101558. doi: 10.1016/j.eclinm.2022.536644. PMID: 03474259; PMCID: [id number]. Huser, W., Schubert, T., Vogelmann, T. et al. All-cause mortality in patients with long-term opioid therapy compared with non-opioid analgesics for chronic non-cancer pain: a database study. BMC Med 18, 162 (2020). http://lester.info/ Rashidian H, Karie Kirks, Malekzadeh R, Haghdoost AA. An Ecological Study of the Association between Opiate Use and Incidence of Cancers. Addict Health. 2016 Fall;8(4):252-260. PMID: 29518841; PMCID: [id number].  Our Goal: Our goal is to control your pain with means other than the use of opioid pain medications.  Our  Recommendation: Talk to your physician about coming off of these medications. We can assist you with the tapering down and stopping these medicines. Based on the new information, even if you cannot completely stop the medication, a decrease in the dose may be associated with a lesser risk. Ask for other means of controlling the pain. Decrease or eliminate those factors that significantly contribute to your pain such as smoking, obesity, and a diet heavily tilted towards "inflammatory" nutrients.  Last Updated: 02/19/2023   ______________________________________________________________________       ______________________________________________________________________    National Pain Medication Shortage  The [place] is experiencing worsening drug shortages. These have had a negative widespread effect on patient care and treatment. Not expected to improve any time soon. Predicted to last past 2029.   Drug shortage list (generic names) Oxycodone IR Oxycodone/APAP Oxymorphone IR Hydromorphone Hydrocodone/APAP Morphine  Where is the problem?  Manufacturing and supply level.  Will this shortage affect you?  Only if you take any of the above pain medications.  How? You may be unable to fill your prescription.  Your pharmacist may offer a "partial fill" of your prescription. (Warning: Do not accept partial fills.)  Prescriptions partially filled cannot be transferred to another pharmacy. Read our Medication Rules and Regulation. Depending on how much medicine you are dependent on, you may experience withdrawals when unable to get the medication.  Recommendations: Consider ending your dependence on opioid pain medications. Ask your pain specialist to assist you with the process. Consider switching to a medication currently not in shortage, such as Buprenorphine. Talk to your pain specialist about this option. Consider decreasing your pain medication requirements by managing tolerance thru  "Drug Holidays". This may help minimize withdrawals, should you run out of medicine. Control your pain thru the use of non-pharmacological interventional therapies.   Your prescriber: Prescribers cannot be blamed for shortages. Medication manufacturing and supply issues cannot be fixed by the prescriber.   NOTE: The prescriber is not responsible for supplying the medication, or solving supply issues. Work with your pharmacist to solve it. The patient is responsible for the decision to take or continue taking the medication and for identifying and securing a legal supply source. By law, supplying the medication is the job and responsibility of the pharmacy. The prescriber is responsible for the evaluation, monitoring, and prescribing of these medications.   Prescribers will NOT: Re-issue prescriptions that have been partially filled. Re-issue prescriptions already sent to a pharmacy.  Re-send prescriptions to a different pharmacy because yours did not have your medication. Ask pharmacist to order more medicine or transfer the prescription to another pharmacy. (Read below.)  New 2023 regulation: "April 14, 2022 Revised Regulation Allows DEA-Registered Pharmacies to Transfer Electronic Prescriptions at a Patient's Request DEA Headquarters Division - Public Information Office Patients now have the ability to request their electronic prescription be transferred to another pharmacy without having to go back to their practitioner to initiate the request. This revised regulation went into effect on Monday, April 10, 2022.     At a patient's request, a DEA-registered retail pharmacy can now transfer an electronic prescription for a controlled substance (schedules II-V) to another DEA-registered retail pharmacy. Prior to this change, patients would have to go through their practitioner to cancel their prescription and have it re-issued to a different pharmacy. The process was taxing and time consuming  for both patients and practitioners.    The Drug Enforcement Administration [facility]) published its intent to revise the process for transferring electronic prescriptions on July 02, 2020.  The final rule was published in the federal register on March 09, 2022 and went into effect 30 days later.  Under the final rule, a prescription can only be transferred once between pharmacies, and only if allowed under existing state or other applicable law. The prescription must remain in its electronic form; may not be altered in any way; and the transfer must be communicated directly between two licensed pharmacists. It's important to note, any authorized refills transfer with the original prescription, which means the entire prescription will be filled at the same pharmacy".  Reference: [email or web address] [facility] website announcement)  [email or web address] [employer])   [employer] / Vol. 88, No. 143 / Thursday, March 09, 2022 / Rules and Regulations DEPARTMENT OF JUSTICE  Drug Enforcement Administration  21 CFR Part 1306  [Docket No. [id number]]  RIN [id number] Transfer of Electronic Prescriptions for Schedules II-V Controlled Substances Between Pharmacies for Initial Filling  ______________________________________________________________________       ______________________________________________________________________    Transfer of Pain Medication between Pharmacies  Re: 2023 DEA Clarification on existing regulation  Published on DEA Website: April 14, 2022  Title:  Revised Regulation Allows DEA-Registered Pharmacies to Transfer Electronic Prescriptions at a Patient's Request DEA Headquarters Division - Public Information Office  "Patients now have the ability to request their electronic prescription be transferred to  another pharmacy without having to go back to their practitioner to initiate the request. This revised regulation went into effect on Monday, April 10, 2022.     At a patient's request, a DEA-registered retail pharmacy can now transfer an electronic prescription for a controlled substance (schedules II-V) to another DEA-registered retail pharmacy. Prior to this change, patients would have to go through their practitioner to cancel their prescription and have it re-issued to a different pharmacy. The process was taxing and time consuming for both patients and practitioners.    The Drug Enforcement Administration [facility]) published its intent to revise the process for transferring electronic prescriptions on July 02, 2020.  The final rule was published in the federal register on March 09, 2022 and went into effect 30 days later.  Under the final rule, a prescription can only be transferred once between pharmacies, and only if allowed under existing state or other applicable law. The prescription must remain in its electronic form; may not be altered in any way; and the transfer must be communicated directly between two licensed pharmacists. It's important to note, any authorized refills transfer with the original prescription, which means the entire prescription will be filled at the same pharmacy."    REFERENCES: 1. DEA website announcement [email or web address]  2. Department of Justice website  [email or web address]  3. DEPARTMENT OF JUSTICE Drug Enforcement Administration 21 CFR Part 1306 [Docket No. [id number]] RIN [id number] "Transfer of Electronic Prescriptions for Schedules II-V Controlled Substances Between Pharmacies for Initial Filling"  ______________________________________________________________________        ______________________________________________________________________    Medication Rules  Purpose: To inform patients, and their family members, of our medication rules and regulations.  Applies to: All patients receiving prescriptions from our practice (written or electronic).  Pharmacy of record: This is the pharmacy where your electronic prescriptions will be sent. Make sure we have the correct one.  Electronic prescriptions: In compliance with the [facility] Strengthen Opioid Misuse Prevention (STOP) Act of 2017 (Session Conni Elliot [phone]), effective August 14, 2018, all controlled substances must be electronically prescribed. Written prescriptions, faxing, or calling prescriptions to a pharmacy will no longer be done.  Prescription refills: These will be provided only during in-person appointments. No medications will be renewed without a "face-to-face" evaluation with your provider. Applies to all prescriptions.  NOTE: The following applies primarily to controlled substances (Opioid* Pain Medications).   Type of encounter (visit): For patients receiving controlled substances, face-to-face visits are required. (Not an option and not up to the patient.)  Patient's responsibilities: Pain Pills: Bring all pain pills to every appointment (except for procedure appointments). Pill Bottles: Bring pills in original pharmacy bottle. Bring bottle, even if empty. Always bring the bottle of the most recent fill.  Medication refills: You are responsible for knowing and keeping track of what medications you are taking and when is it that you will need a refill. The day before your appointment: write a list of all prescriptions that need to be refilled. The day of the appointment: give the list to the admitting nurse. Prescriptions will be written only during appointments. No prescriptions will be written on procedure days. If you forget a medication: it will not be "Called in", "Faxed", or  "electronically sent". You will need to get another appointment to get these prescribed.  No early refills. Do not call asking to have your prescription filled early. Partial  or short prescriptions: Occasionally your pharmacy may not have enough pills to fill your prescription.  NEVER ACCEPT a partial fill or a prescription that is short of the total amount of pills that you were prescribed.  With controlled substances the law allows 72 hours for the pharmacy to complete the prescription.  If the prescription is not completed within 72 hours, the pharmacist will require a new prescription to be written. This means that you will be short on your medicine and we WILL NOT send another prescription to complete your original prescription.  Instead, request the pharmacy to send a carrier to a nearby branch to get enough medication to provide you with your full prescription. Prescription Accuracy: You are responsible for carefully inspecting your prescriptions before leaving our office. Have the discharge nurse carefully go over each prescription with you, before taking them home. Make sure that your name is accurately spelled, that your address is correct. Check the name and dose of your medication to make sure it is accurate. Check the number of pills, and the written instructions to make sure they are clear and accurate. Make sure that you are given enough medication to last until your next medication refill appointment. Taking Medication: Take medication as prescribed. When it comes to controlled substances, taking less pills or less frequently than prescribed is permitted and encouraged. Never take more pills than instructed. Never take the medication more frequently than prescribed.  Inform other Doctors: Always inform, all of your healthcare providers, of all the medications you take. Pain Medication from other Providers: You are not allowed to accept any additional pain medication from any other Doctor or  Healthcare provider. There are two exceptions to this rule. (see below) In the event that you require additional pain medication, you are responsible for notifying us, as stated below. Cough Medicine: Often these contain an opioid, such as codeine or hydrocodone. Never accept or take cough medicine containing these opioids if you are already taking an opioid* medication. The combination may cause respiratory failure and death. Medication Agreement: You are responsible for carefully reading and following our Medication Agreement. This must be signed before receiving any prescriptions from our practice. Safely store a copy of your signed Agreement. Violations to the Agreement will result in no further prescriptions. (Additional copies of our Medication Agreement are available upon request.) Laws, Rules, & Regulations: All patients are expected to follow all [address] and [employer], Rules, [place] [employer]. Ignorance of the Laws does not constitute a valid excuse.  Illegal drugs and Controlled Substances: The use of illegal substances (including, but not limited to marijuana and its derivatives) and/or the illegal use of any controlled substances is strictly prohibited. Violation of this rule may result in the immediate and permanent discontinuation of any and all prescriptions being written by our practice. The use of any illegal substances is prohibited. Adopted CDC guidelines & recommendations: Target dosing levels will be at or below 60 MME/day. Use of benzodiazepines** is not recommended.  Exceptions: There are only two exceptions to the rule of not receiving pain medications from other Healthcare Providers. Exception #1 (Emergencies): In the event of an emergency (i.e.: accident requiring emergency care), you are allowed to receive additional pain medication. However, you are responsible for: As soon as you are able, call our office [phone], at any time of the day or night, and leave a  message stating your  name, the date and nature of the emergency, and the name and dose of the medication prescribed. In the event that your call is answered by a member of our staff, make sure to document and save the date, time, and the name of the person that took your information.  Exception #2 (Planned Surgery): In the event that you are scheduled by another doctor or dentist to have any type of surgery or procedure, you are allowed (for a period no longer than 30 days), to receive additional pain medication, for the acute post-op pain. However, in this case, you are responsible for picking up a copy of our "Post-op Pain Management for Surgeons" handout, and giving it to your surgeon or dentist. This document is available at our office, and does not require an appointment to obtain it. Simply go to our office during business hours (Monday-Thursday from 8:00 AM to 4:00 PM) (Friday 8:00 AM to 12:00 Noon) or if you have a scheduled appointment with [place], prior to your surgery, and ask for it by name. In addition, you are responsible for: calling our office [phone], at any time of the day or night, and leaving a message stating your name, name of your surgeon, type of surgery, and date of procedure or surgery. Failure to comply with your responsibilities may result in termination of therapy involving the controlled substances. Medication Agreement Violation. Following the above rules, including your responsibilities will help you in avoiding a Medication Agreement Violation ("Breaking your Pain Medication Contract").  Consequences:  Not following the above rules may result in permanent discontinuation of medication prescription therapy.  *Opioid medications include: morphine, codeine, oxycodone, oxymorphone, hydrocodone, hydromorphone, meperidine, tramadol, tapentadol, buprenorphine, fentanyl, methadone. **Benzodiazepine medications include: diazepam (Valium), alprazolam (Xanax), clonazepam (Klonopine),  lorazepam (Ativan), clorazepate (Tranxene), chlordiazepoxide (Librium), estazolam (Prosom), oxazepam (Serax), temazepam (Restoril), triazolam (Halcion) (Last updated: 06/06/2022) ______________________________________________________________________      ______________________________________________________________________    Medication Recommendations and Reminders  Applies to: All patients receiving prescriptions (written and/or electronic).  Medication Rules & Regulations: You are responsible for reading, knowing, and following our "Medication Rules" document. These exist for your safety and that of others. They are not flexible and neither are we. Dismissing or ignoring them is an act of "non-compliance" that may result in complete and irreversible termination of such medication therapy. For safety reasons, "non-compliance" will not be tolerated. As with the [place] fundamental legal principle of "ignorance of the law is no defense", we will accept no excuses for not having read and knowing the content of documents provided to you by our practice.  Pharmacy of record:  Definition: This is the pharmacy where your electronic prescriptions will be sent.  We do not endorse any particular pharmacy. It is up to you and your insurance to decide what pharmacy to use.  We do not restrict you in your choice of pharmacy. However, once we write for your prescriptions, we will NOT be re-sending more prescriptions to fix restricted supply problems created by your pharmacy, or your insurance.  The pharmacy listed in the electronic medical record should be the one where you want electronic prescriptions to be sent. If you choose to change pharmacy, simply notify our nursing staff. Changes will be made only during your regular appointments and not over the phone.  Recommendations: Keep all of your pain medications in a safe place, under lock and key, even if you live alone. We will NOT replace lost, stolen, or  damaged medication. We do not accept "Police  Reports" as proof of medications having been stolen. After you fill your prescription, take 1 week's worth of pills and put them away in a safe place. You should keep a separate, properly labeled bottle for this purpose. The remainder should be kept in the original bottle. Use this as your primary supply, until it runs out. Once it's gone, then you know that you have 1 week's worth of medicine, and it is time to come in for a prescription refill. If you do this correctly, it is unlikely that you will ever run out of medicine. To make sure that the above recommendation works, it is very important that you make sure your medication refill appointments are scheduled at least 1 week before you run out of medicine. To do this in an effective manner, make sure that you do not leave the office without scheduling your next medication management appointment. Always ask the nursing staff to show you in your prescription , when your medication will be running out. Then arrange for the receptionist to get you a return appointment, at least 7 days before you run out of medicine. Do not wait until you have 1 or 2 pills left, to come in. This is very poor planning and does not take into consideration that we may need to cancel appointments due to bad weather, sickness, or emergencies affecting our staff. DO NOT ACCEPT A "Partial Fill": If for any reason your pharmacy does not have enough pills/tablets to completely fill or refill your prescription, do not allow for a "partial fill". The law allows the pharmacy to complete that prescription within 72 hours, without requiring a new prescription. If they do not fill the rest of your prescription within those 72 hours, you will need a separate prescription to fill the remaining amount, which we will NOT provide. If the reason for the partial fill is your insurance, you will need to talk to the pharmacist about payment alternatives for  the remaining tablets, but again, DO NOT ACCEPT A PARTIAL FILL, unless you can trust your pharmacist to obtain the remainder of the pills within 72 hours.  Prescription refills and/or changes in medication(s):  Prescription refills, and/or changes in dose or medication, will be conducted only during scheduled medication management appointments. (Applies to both, written and electronic prescriptions.) No refills on procedure days. No medication will be changed or started on procedure days. No changes, adjustments, and/or refills will be conducted on a procedure day. Doing so will interfere with the diagnostic portion of the procedure. No phone refills. No medications will be "called into the pharmacy". No Fax refills. No weekend refills. No Holliday refills. No after hours refills.  Remember:  Business hours are:  Monday to Thursday 8:00 AM to 4:00 PM Provider's Schedule: Delano Metz, MD - Appointments are:  Medication management: Monday and Wednesday 8:00 AM to 4:00 PM Procedure day: Tuesday and Thursday 7:30 AM to 4:00 PM Edward Jolly, MD - Appointments are:  Medication management: Tuesday and Thursday 8:00 AM to 4:00 PM Procedure day: Monday and Wednesday 7:30 AM to 4:00 PM (Last update: 06/06/2022) ______________________________________________________________________      ______________________________________________________________________     Naloxone Nasal Spray  Why am I receiving this medication? [place] STOP ACT requires that all patients taking high dose opioids or at risk of opioids respiratory depression, be prescribed an opioid reversal agent, such as Naloxone (AKA: Narcan).  What is this medication? NALOXONE (nal OX one) treats opioid overdose, which causes slow or shallow breathing,  severe drowsiness, or trouble staying awake. Call emergency services after using this medication. You may need additional treatment. Naloxone works by reversing the effects  of opioids. It belongs to a group of medications called opioid blockers.  COMMON BRAND NAME(S): Kloxxado, Narcan  What should I tell my care team before I take this medication? They need to know if you have any of these conditions: Heart disease Substance use disorder An unusual or allergic reaction to naloxone, other medications, foods, dyes, or preservatives Pregnant or trying to get pregnant Breast-feeding  When to use this medication? This medication is to be used for the treatment of respiratory depression (less than 8 breaths per minute) secondary to opioid overdose.   How to use this medication? This medication is for use in the nose. Lay the person on their back. Support their neck with your hand and allow the head to tilt back before giving the medication. The nasal spray should be given into 1 nostril. After giving the medication, move the person onto their side. Do not remove or test the nasal spray until ready to use. Get emergency medical help right away after giving the first dose of this medication, even if the person wakes up. You should be familiar with how to recognize the signs and symptoms of a narcotic overdose. If more doses are needed, give the additional dose in the other nostril. Talk to your care team about the use of this medication in children. While this medication may be prescribed for children as young as newborns for selected conditions, precautions do apply.  Naloxone Overdosage: If you think you have taken too much of this medicine contact a poison control center or emergency room at once.  NOTE: This medicine is only for you. Do not share this medicine with others.  What if I miss a dose? This does not apply.  What may interact with this medication? This is only used during an emergency. No interactions are expected during emergency use. This list may not describe all possible interactions. Give your health care provider a list of all the medicines,  herbs, non-prescription drugs, or dietary supplements you use. Also tell them if you smoke, drink alcohol, or use illegal drugs. Some items may interact with your medicine.  What should I watch for while using this medication? Keep this medication ready for use in the case of an opioid overdose. Make sure that you have the phone number of your care team and local hospital ready. You may need to have additional doses of this medication. Each nasal spray contains a single dose. Some emergencies may require additional doses. After use, bring the treated person to the nearest hospital or call 911. Make sure the treating care team knows that the person has received a dose of this medication. You will receive additional instructions on what to do during and after use of this medication before an emergency occurs.  What side effects may I notice from receiving this medication? Side effects that you should report to your care team as soon as possible: Allergic reactions--skin rash, itching, hives, swelling of the face, lips, tongue, or throat Side effects that usually do not require medical attention (report these to your care team if they continue or are bothersome): Constipation Dryness or irritation inside the nose Headache Increase in blood pressure Muscle spasms Stuffy nose Toothache This list may not describe all possible side effects. Call your doctor for medical advice about side effects. You may report side effects to FDA  at 1-800-FDA-1088.  Where should I keep my medication? Because this is an emergency medication, you should keep it with you at all times.  Keep out of the reach of children and pets. Store between 20 and 25 degrees C (68 and 77 degrees F). Do not freeze. Throw away any unused medication after the expiration date. Keep in original box until ready to use.  NOTE: This sheet is a summary. It may not cover all possible information. If you have questions about this medicine, talk  to your doctor, pharmacist, or health care provider.   2023 Elsevier/Gold Standard (2021-04-08 00:00:00)  ______________________________________________________________________

## 2023-05-28 ENCOUNTER — Encounter: Payer: Self-pay | Admitting: Pain Medicine

## 2023-05-28 ENCOUNTER — Encounter: Payer: Self-pay | Admitting: Family Medicine

## 2023-05-28 ENCOUNTER — Ambulatory Visit: Payer: Medicare HMO | Attending: Pain Medicine | Admitting: Pain Medicine

## 2023-05-28 ENCOUNTER — Ambulatory Visit: Payer: Medicare HMO | Admitting: Family Medicine

## 2023-05-28 VITALS — BP 118/76 | HR 78 | Temp 98.1°F | Ht 63.0 in | Wt 238.4 lb

## 2023-05-28 DIAGNOSIS — M25561 Pain in right knee: Secondary | ICD-10-CM | POA: Insufficient documentation

## 2023-05-28 DIAGNOSIS — K760 Fatty (change of) liver, not elsewhere classified: Secondary | ICD-10-CM | POA: Diagnosis not present

## 2023-05-28 DIAGNOSIS — Z79899 Other long term (current) drug therapy: Secondary | ICD-10-CM | POA: Insufficient documentation

## 2023-05-28 DIAGNOSIS — M25552 Pain in left hip: Secondary | ICD-10-CM | POA: Insufficient documentation

## 2023-05-28 DIAGNOSIS — R748 Abnormal levels of other serum enzymes: Secondary | ICD-10-CM | POA: Insufficient documentation

## 2023-05-28 DIAGNOSIS — E1169 Type 2 diabetes mellitus with other specified complication: Secondary | ICD-10-CM

## 2023-05-28 DIAGNOSIS — M51379 Other intervertebral disc degeneration, lumbosacral region without mention of lumbar back pain or lower extremity pain: Secondary | ICD-10-CM

## 2023-05-28 DIAGNOSIS — M542 Cervicalgia: Secondary | ICD-10-CM | POA: Insufficient documentation

## 2023-05-28 DIAGNOSIS — M79605 Pain in left leg: Secondary | ICD-10-CM | POA: Diagnosis not present

## 2023-05-28 DIAGNOSIS — M25562 Pain in left knee: Secondary | ICD-10-CM | POA: Diagnosis not present

## 2023-05-28 DIAGNOSIS — Z720 Tobacco use: Secondary | ICD-10-CM

## 2023-05-28 DIAGNOSIS — M79602 Pain in left arm: Secondary | ICD-10-CM | POA: Diagnosis not present

## 2023-05-28 DIAGNOSIS — E785 Hyperlipidemia, unspecified: Secondary | ICD-10-CM | POA: Diagnosis not present

## 2023-05-28 DIAGNOSIS — R768 Other specified abnormal immunological findings in serum: Secondary | ICD-10-CM | POA: Diagnosis not present

## 2023-05-28 DIAGNOSIS — M79604 Pain in right leg: Secondary | ICD-10-CM | POA: Insufficient documentation

## 2023-05-28 DIAGNOSIS — M79601 Pain in right arm: Secondary | ICD-10-CM | POA: Insufficient documentation

## 2023-05-28 DIAGNOSIS — F1120 Opioid dependence, uncomplicated: Secondary | ICD-10-CM | POA: Diagnosis present

## 2023-05-28 DIAGNOSIS — M47816 Spondylosis without myelopathy or radiculopathy, lumbar region: Secondary | ICD-10-CM | POA: Insufficient documentation

## 2023-05-28 DIAGNOSIS — G894 Chronic pain syndrome: Secondary | ICD-10-CM | POA: Insufficient documentation

## 2023-05-28 DIAGNOSIS — M25551 Pain in right hip: Secondary | ICD-10-CM | POA: Insufficient documentation

## 2023-05-28 DIAGNOSIS — E119 Type 2 diabetes mellitus without complications: Secondary | ICD-10-CM

## 2023-05-28 DIAGNOSIS — Z79891 Long term (current) use of opiate analgesic: Secondary | ICD-10-CM | POA: Insufficient documentation

## 2023-05-28 DIAGNOSIS — G8929 Other chronic pain: Secondary | ICD-10-CM | POA: Diagnosis not present

## 2023-05-28 DIAGNOSIS — Z7984 Long term (current) use of oral hypoglycemic drugs: Secondary | ICD-10-CM

## 2023-05-28 DIAGNOSIS — I10 Essential (primary) hypertension: Secondary | ICD-10-CM

## 2023-05-28 DIAGNOSIS — M545 Low back pain, unspecified: Secondary | ICD-10-CM | POA: Diagnosis not present

## 2023-05-28 DIAGNOSIS — M533 Sacrococcygeal disorders, not elsewhere classified: Secondary | ICD-10-CM | POA: Insufficient documentation

## 2023-05-28 LAB — POCT GLYCOSYLATED HEMOGLOBIN (HGB A1C): Hemoglobin A1C: 6.9 % — AB (ref 4.0–5.6)

## 2023-05-28 MED ORDER — OXYCODONE HCL 5 MG PO TABS: 5.0000 mg | ORAL_TABLET | Freq: Three times a day (TID) | ORAL | 0 refills | Status: DC | PRN

## 2023-05-28 MED ORDER — OZEMPIC (0.25 OR 0.5 MG/DOSE) 2 MG/3ML ~~LOC~~ SOPN: 0.2500 mg | PEN_INJECTOR | SUBCUTANEOUS | 0 refills | Status: DC

## 2023-05-28 NOTE — Assessment & Plan Note (Signed)
Disc in detail risks of smoking and possible outcomes including copd, vascular/ heart disease, cancer , respiratory and sinus infections  Pt voices understanding  3-4 cig per day Not ready to quit

## 2023-05-28 NOTE — Assessment & Plan Note (Signed)
This was noted at hepatologist at [facility]  Was 334 Also mildly elevated ANA Recommended rheum referral for this ? Myositis or other  Also history of chronic pain and liver enzyme elevation   Referral done

## 2023-05-28 NOTE — Assessment & Plan Note (Signed)
This was noted with hepatology office for fatty liver  1:80 speckled Has chronic pain issues No joint swelling Req ref to rheumatology  Will review records and labs

## 2023-05-28 NOTE — Assessment & Plan Note (Signed)
Lab Results  Component Value Date   HGBA1C 6.9 (A) 05/28/2023   This is up  Tolerating metformin xr 500 mg bid better than short acting but would not tolerate increase dose Discussed low glycemic diet Plans to sched eye exam asap  Microalb utd  Intol of statins   Would benefit from weight loss Discussed GLP -1 med Disc option of GLP medication including possible side effects like GI intolerance and risk of thyroid and endocrine cancer, pancreatitis and gallstones, kidney problems and diabetic retinopathy Sent prescription for semaglutide to pharm to check coverage If able to get will want to follow up 1 mo

## 2023-05-28 NOTE — Assessment & Plan Note (Signed)
bp in fair control at this time  BP Readings from Last 1 Encounters:  05/28/23 118/76   No changes needed Most recent labs reviewed  Disc lifstyle change with low sodium diet and exercise  Plan to continue  amlodiine 10 mg daily  hctz 25 mg daily

## 2023-05-28 NOTE — Assessment & Plan Note (Signed)
Disc goals for lipids and reasons to control them Rev last labs with pt Rev low sat fat diet in detail  No longer on statin due to elevated transaminases   On zetia and bempedoic   Lab last - LDL 81 Followed by cardiology

## 2023-05-28 NOTE — Progress Notes (Signed)
Subjective:    Patient ID: Teresa Mcgee, female    DOB: 07/03/1966, 57 y.o.   MRN: [id number]  HPI  Wt Readings from Last 3 Encounters:  05/28/23 238 lb 6 oz (108.1 kg)  03/15/23 237 lb (107.5 kg)  03/12/23 236 lb (107 kg)   42.23 kg/m  Vitals:   05/28/23 1124  BP: 118/76  Pulse: 78  Temp: 98.1 F (36.7 C)  SpO2: 97%    Pt presents for follow up of DM2 and HTN and chronic medical problems   HTN bp is stable today  No cp or palpitations or headaches or edema  No side effects to medicines  BP Readings from Last 3 Encounters:  05/28/23 118/76  03/15/23 116/80  03/12/23 131/67     Amlodipine 10 mg daily  Hydrochlorothiazide 25 mg daily   Lab Results  Component Value Date   NA 140 12/29/2022   K 3.4 (L) 12/29/2022   CO2 29 12/29/2022   GLUCOSE 128 (H) 12/29/2022   BUN 12 12/29/2022   CREATININE 0.89 12/29/2022   CALCIUM 9.7 12/29/2022   GFR 70.61 12/01/2022   GFRNONAA >60 12/29/2022   Smoking status   DM2 Lab Results  Component Value Date   HGBA1C 6.9 (A) 05/28/2023   Metformin - last visit changed to XR to see if it would help with diarrhea  Metformin xr 500 mg bid  Did help a bit   Today Results for orders placed or performed in visit on 05/28/23  POCT HgB A1C  Result Value Ref Range   Hemoglobin A1C 6.9 (A) 4.0 - 5.6 %   HbA1c POC (<> result, manual entry)     HbA1c, POC (prediabetic range)     HbA1c, POC (controlled diabetic range)        Thinks she is eating well  Avoids sugars  Does not eat too much  Does not eat all day long   Some times she goes all day w/o eating   Pushes herself to exercise despite pain Walks across her yard many times daily / takes care of a neighbor  30 minutes daily    Eye exam: [facility]/needs to call and schedule   Dm foot exam 05/21/23 with podiatry    Lab Results  Component Value Date   MICROALBUR 1.4 12/01/2022   MICROALBUR <0.7 08/19/2021    Chronic pain  Back  Arms  Recently  knees are the worst  No swollen red joints     Hyperlipidemia  Lab Results  Component Value Date   CHOL 158 12/01/2022   HDL 33.00 (L) 12/01/2022   LDLCALC 81 08/01/2022   LDLDIRECT 103.0 12/01/2022   TRIG 263.0 (H) 12/01/2022   CHOLHDL 5 12/01/2022   Cannot tolerate statins and also has baseline elevated LFTs   Repatha currently  Also zetia 10 mg daily  Was seeing specialist at [facility] for fatty liver  Had borderline ANA at [facility]  Did bx of liver  IgG - elevated  CK 334 GGT 106     Obesity  Runs in family      Patient Active Problem List   Diagnosis Date Noted  . Elevated antinuclear antibody (ANA) level 05/28/2023  . Morbid obesity (HCC) 05/28/2023  . Elevated CK 05/28/2023  . Right shoulder pain 03/15/2023  . Hypomagnesemia 02/21/2022  . Hypokalemia 02/21/2022  . Fatty liver 02/06/2022  . Allergic conjunctivitis 12/01/2021  . Elevated LFTs 11/22/2021  . Medication monitoring encounter 11/22/2021  . Fungal dermatitis 11/22/2021  .  Routine general medical examination at a health care facility 08/19/2021  . Encounter for screening mammogram for breast cancer 08/19/2021  . Chronic use of opiate for therapeutic purpose 11/04/2020  . Uncomplicated opioid dependence (HCC) 11/04/2020  . Ragged cuticle 09/23/2020  . Diabetes mellitus treated with oral medication (HCC) 03/30/2019  . Pain due to onychomycosis of toenails of both feet 03/03/2019  . Cervicalgia 04/22/2018  . Chronic musculoskeletal pain 04/22/2018  . DDD (degenerative disc disease), thoracic 04/22/2018  . DDD (degenerative disc disease), cervical 04/22/2018  . Lumbar facet arthropathy (Bilateral) 04/22/2018  . Lumbar facet syndrome (Bilateral) 04/22/2018  . Chronic low back pain (1ry area of Pain) (Bilateral) (L>R) w/o sciatica 04/22/2018  . Spondylosis without myelopathy or radiculopathy, cervical region 04/22/2018  . Spondylosis without myelopathy or radiculopathy, lumbosacral region 04/22/2018  .  DDD (degenerative disc disease), lumbosacral 04/22/2018  . Strain of lumbar paraspinal muscle, sequela 04/22/2018  . Chronic hip pain (Bilateral) (R>L) 04/22/2018  . Chronic sacroiliac joint pain (Left) 04/22/2018  . Neurogenic pain 04/22/2018  . Osteoarthritis involving multiple joints 04/22/2018  . Chronic knee pain (Bilateral) (R>L) 04/22/2018  . Chronic upper extremity pain (4th area of Pain) (Bilateral) (L>R) 02/27/2018  . Vitamin D deficiency 02/27/2018  . Chronic low back pain (Bilateral) (L>R) w/ sciatica (Bilateral) 01/24/2018  . Chronic lower extremity pain (2ry area of Pain) (Bilateral) (L>R) 01/24/2018  . Chronic neck pain (3ry area of Pain) (Bilateral) (L>R) 01/24/2018  . Chronic pain syndrome 01/24/2018  . Long term current use of opiate analgesic 01/24/2018  . Disorder of skeletal system 01/24/2018  . Nonallopathic lesion of head region 04/25/2012  . Constipation 01/11/2012  . Chest pain 01/11/2012  . 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 Numbness and tingling in both hands 08/25/2011  . 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  . Muscle spasms of neck 05/23/2011  . Nasal mucositis (ulcerative) 05/23/2011  . Fatigue 03/21/2011  . Tobacco abuse 05/24/2010  . Hypothyroidism 01/25/2010  . HYPERTENSION, BENIGN ESSENTIAL 01/25/2010  . Hyperlipidemia associated with type 2 diabetes mellitus (HCC) 12/31/2009  . DEPRESSION 12/31/2009  . GERD 12/31/2009  . OSA (obstructive sleep apnea) 12/31/2009   Past Medical History:  Diagnosis Date  . Arthritis   . Bell's palsy may 2012  . Chest pain   . Diabetes mellitus without complication (HCC)   . GERD (gastroesophageal reflux disease)   . H/O hiatal hernia   . Hyperlipidemia   . Hypertension   . Hypothyroidism   . Shingles   . Shortness of breath   . Sleep apnea    uses cpap   Past Surgical History:  Procedure Laterality Date  . ABDOMINAL HYSTERECTOMY    . CESAREAN SECTION    . cesarian     3x  . COLONOSCOPY WITH PROPOFOL N/A  11/27/2019   Procedure: COLONOSCOPY WITH PROPOFOL;  Surgeon: Wyline Mood, MD;  Location: [facility] ENDOSCOPY;  Service: Gastroenterology;  Laterality: N/A;  . ESOPHAGOGASTRODUODENOSCOPY (EGD) WITH PROPOFOL N/A 07/20/2020   Procedure: ESOPHAGOGASTRODUODENOSCOPY (EGD) WITH PROPOFOL;  Surgeon: Wyline Mood, MD;  Location: [facility] ENDOSCOPY;  Service: Gastroenterology;  Laterality: N/A;  . PARTIAL HYSTERECTOMY  2000   abnormal uterine bleeding  . THYROIDECTOMY  1998  . TOOTH EXTRACTION    . TUBAL LIGATION     Social History   Tobacco Use  . Smoking status: Every Day    Current packs/day: 0.50    Average packs/day: 0.5 packs/day for 20.0 years (10.0 ttl pk-yrs)    Types: Cigarettes  . Smokeless tobacco:  Never  . Tobacco comments:    2-3 cigs daily-09/22/2020  Vaping Use  . Vaping status: Never Used  Substance Use Topics  . Alcohol use: No  . Drug use: No   Family History  Problem Relation Age of Onset  . Heart attack Father 53       deceased  . Diabetes Brother   . Stroke Brother 25       twice  . Stroke Sister 56  . Diabetes Sister   . Heart attack Sister 79  . Heart attack Brother 39  . Breast cancer Maternal Aunt    Allergies  Allergen Reactions  . Statins     myopathy   Current Outpatient Medications on File Prior to Visit  Medication Sig Dispense Refill  . Accu-Chek FastClix Lancets MISC USE UP TO 4 TIMES DAILY AS DIRECTED 102 each 1  . ACCU-CHEK GUIDE test strip USE UP TO 4 TIMES DAILY AS DIRECTED 100 strip 5  . albuterol (VENTOLIN HFA) 108 (90 Base) MCG/ACT inhaler INHALE 2 PUFFS INTO THE LUNGS EVERY 4 HOURS AS NEEDED FOR (COUGH, SHORTNESS OF BREATH OR WHEEZING) 8.5 each 2  . amLODipine (NORVASC) 10 MG tablet TAKE 1 TABLET BY MOUTH EVERY DAY 90 tablet 2  . azelastine (OPTIVAR) 0.05 % ophthalmic solution Place 1 drop into both eyes 2 (two) times daily as needed. 6 mL 3  . blood glucose meter kit and supplies Dispense based on patient and insurance preference. Use up to four  times daily as directed. (FOR ICD-10 E10.9, E11.9). 1 each 0  . [employer] D3 125 MCG (5000 UT) capsule TAKE 1 CAPSULE BY MOUTH DAILY WITH BREAKFAST. TAKE ALONG WITH CALCIUM AND MAGNESIUM.  5  . Evolocumab (REPATHA SURECLICK) 140 MG/ML SOAJ Inject 140 mg into the skin every 14 (fourteen) days. 6 mL 3  . ezetimibe (ZETIA) 10 MG tablet TAKE 1 TABLET BY MOUTH EVERY DAY 90 tablet 2  . fluticasone (FLONASE) 50 MCG/ACT nasal spray SPRAY 2 SPRAYS INTO EACH NOSTRIL EVERY DAY 48 mL 1  . gabapentin (NEURONTIN) 100 MG capsule TAKE 1 CAPSULE (100 MG TOTAL) BY MOUTH 2 (TWO) TIMES DAILY AND 3 CAPSULES (300 MG TOTAL) AT BEDTIME. 150 capsule 5  . hydrochlorothiazide (HYDRODIURIL) 25 MG tablet TAKE 1 TABLET (25 MG TOTAL) BY MOUTH DAILY. AS DIRECTED 90 tablet 1  . levocetirizine (XYZAL) 5 MG tablet TAKE 1 TABLET BY MOUTH EVERY DAY IN THE EVENING 90 tablet 1  . levothyroxine (SYNTHROID) 112 MCG tablet Take 1 tablet (112 mcg total) by mouth daily before breakfast. 90 tablet 1  . Magnesium Oxide -Mg Supplement 500 MG CAPS TAKE 1 CAPSULE BY MOUTH TWICE A DAY (AT 8AM & 10PM) 180 capsule 0  . meloxicam (MOBIC) 15 MG tablet TAKE 1 TABLET BY MOUTH EVERY DAY WITH FOOD AS NEEDED FOR PAIN 90 tablet 0  . metFORMIN (GLUCOPHAGE-XR) 500 MG 24 hr tablet Take 1 tablet (500 mg total) by mouth 2 (two) times daily with a meal. 180 tablet 2  . naloxone (NARCAN) nasal spray 4 mg/0.1 mL Place 1 spray into the nose as needed for up to 365 doses (for opioid-induced respiratory depresssion). In case of emergency (overdose), spray once into each nostril. If no response within 3 minutes, repeat application and call 911. 1 each 0  . Olopatadine HCl 0.2 % SOLN INSTILL 1 DROP INTO BOTH EYES EVERY DAY AS NEEDED    . oxyCODONE (OXY IR/ROXICODONE) 5 MG immediate release tablet Take 1 tablet (5 mg  total) by mouth every 8 (eight) hours as needed for severe pain. Must last 30 days 90 tablet 0  . potassium chloride (KLOR-CON) 10 MEQ tablet TAKE 1 TABLET BY MOUTH  EVERY DAY 90 tablet 1  . REFRESH CELLUVISC 1 % GEL APPLY 1 DROP TO EYE 3 (THREE) TIMES DAILY.  12  . tiZANidine (ZANAFLEX) 4 MG tablet Take 1 tablet (4 mg total) by mouth every 8 (eight) hours as needed for muscle spasms. 270 tablet 1   No current facility-administered medications on file prior to visit.    Review of Systems  Constitutional:  Negative for activity change, appetite change, fatigue, fever and unexpected weight change.       Having a hard time trying to loose weight   HENT:  Negative for congestion, ear pain, rhinorrhea, sinus pressure and sore throat.   Eyes:  Negative for pain, redness and visual disturbance.  Respiratory:  Negative for cough, shortness of breath and wheezing.   Cardiovascular:  Negative for chest pain and palpitations.  Gastrointestinal:  Negative for abdominal pain, blood in stool, constipation and diarrhea.       Less diarrhea   Endocrine: Negative for polydipsia and polyuria.  Genitourinary:  Negative for dysuria, frequency and urgency.  Musculoskeletal:  Negative for arthralgias, back pain and myalgias.  Skin:  Negative for pallor and rash.  Allergic/Immunologic: Negative for environmental allergies.  Neurological:  Negative for dizziness, syncope and headaches.  Hematological:  Negative for adenopathy. Does not bruise/bleed easily.  Psychiatric/Behavioral:  Negative for decreased concentration and dysphoric mood. The patient is not nervous/anxious.        Objective:   Physical Exam Constitutional:      General: She is not in acute distress.    Appearance: Normal appearance. She is well-developed. She is obese. She is not ill-appearing or diaphoretic.  HENT:     Head: Normocephalic and atraumatic.  Eyes:     Conjunctiva/sclera: Conjunctivae normal.     Pupils: Pupils are equal, round, and reactive to light.  Neck:     Thyroid: No thyromegaly.     Vascular: No carotid bruit or JVD.  Cardiovascular:     Rate and Rhythm: Normal rate and  regular rhythm.     Heart sounds: Normal heart sounds.     No gallop.  Pulmonary:     Effort: Pulmonary effort is normal. No respiratory distress.     Breath sounds: Normal breath sounds. No wheezing or rales.  Abdominal:     General: There is no distension or abdominal bruit.     Palpations: Abdomen is soft. There is no mass.     Tenderness: There is no abdominal tenderness. There is no guarding or rebound.  Musculoskeletal:     Cervical back: Normal range of motion and neck supple.     Right lower leg: No edema.     Left lower leg: No edema.     Comments: No acute joint changes    Lymphadenopathy:     Cervical: No cervical adenopathy.  Skin:    General: Skin is warm and dry.     Coloration: Skin is not pale.     Findings: No rash.  Neurological:     Mental Status: She is alert.     Coordination: Coordination normal.     Deep Tendon Reflexes: Reflexes are normal and symmetric. Reflexes normal.  Psychiatric:        Mood and Affect: Mood normal.  Assessment & Plan:   Problem List Items Addressed This Visit       Cardiovascular and Mediastinum   HYPERTENSION, BENIGN ESSENTIAL    bp in fair control at this time  BP Readings from Last 1 Encounters:  05/28/23 118/76   No changes needed Most recent labs reviewed  Disc lifstyle change with low sodium diet and exercise  Plan to continue  amlodiine 10 mg daily  hctz 25 mg daily          Digestive   Fatty liver    Seeing hepatology Reviewed records and labs  Aware weight loss is treatment of choice  Interested in trial of GLP med for this and DM2          Endocrine   Diabetes mellitus treated with oral medication (HCC) - Primary    Lab Results  Component Value Date   HGBA1C 6.9 (A) 05/28/2023   This is up  Tolerating metformin xr 500 mg bid better than short acting but would not tolerate increase dose Discussed low glycemic diet Plans to sched eye exam asap  Microalb utd  Intol of statins    Would benefit from weight loss Discussed GLP -1 med Disc option of GLP medication including possible side effects like GI intolerance and risk of thyroid and endocrine cancer, pancreatitis and gallstones, kidney problems and diabetic retinopathy Sent prescription for semaglutide to pharm to check coverage If able to get will want to follow up 1 mo        Relevant Medications   Semaglutide,0.25 or 0.5MG /DOS, (OZEMPIC, 0.25 OR 0.5 MG/DOSE,) 2 MG/3ML SOPN   Hyperlipidemia associated with type 2 diabetes mellitus (HCC)    Disc goals for lipids and reasons to control them Rev last labs with pt Rev low sat fat diet in detail  No longer on statin due to elevated transaminases   On zetia and bempedoic   Lab last - LDL 81 Followed by cardiology      Relevant Medications   Semaglutide,0.25 or 0.5MG /DOS, (OZEMPIC, 0.25 OR 0.5 MG/DOSE,) 2 MG/3ML SOPN     Other   Elevated antinuclear antibody (ANA) level    This was noted with hepatology office for fatty liver  1:80 speckled Has chronic pain issues No joint swelling Req ref to rheumatology  Will review records and labs       Elevated CK    This was noted at hepatologist at [facility]  Was 334 Also mildly elevated ANA Recommended rheum referral for this ? Myositis or other  Also history of chronic pain and liver enzyme elevation   Referral done      Morbid obesity (HCC)    Bmi of 42.2 Discussed how this problem influences overall health and the risks it imposes  Reviewed plan for weight loss with lower calorie diet (via better food choices (lower glycemic and portion control) along with exercise building up to or more than 30 minutes 5 days per week including some aerobic activity and strength training    Has both DM and fatty liver Discussed GLP-1 as an option/ may be a good candidate        Relevant Medications   Semaglutide,0.25 or 0.5MG /DOS, (OZEMPIC, 0.25 OR 0.5 MG/DOSE,) 2 MG/3ML SOPN   Tobacco abuse    Disc in  detail risks of smoking and possible outcomes including copd, vascular/ heart disease, cancer , respiratory and sinus infections  Pt voices understanding  3-4 cig per day Not ready to quit  Other Visit Diagnoses     Severe obesity (BMI >= 40) (HCC)   (Chronic)     Relevant Medications   Semaglutide,0.25 or 0.5MG /DOS, (OZEMPIC, 0.25 OR 0.5 MG/DOSE,) 2 MG/3ML SOPN

## 2023-05-28 NOTE — Assessment & Plan Note (Signed)
Bmi of 42.2 Discussed how this problem influences overall health and the risks it imposes  Reviewed plan for weight loss with lower calorie diet (via better food choices (lower glycemic and portion control) along with exercise building up to or more than 30 minutes 5 days per week including some aerobic activity and strength training    Has both DM and fatty liver Discussed GLP-1 as an option/ may be a good candidate

## 2023-05-28 NOTE — Progress Notes (Signed)
Nursing Pain Medication Assessment:  Safety precautions to be maintained throughout the outpatient stay will include: orient to surroundings, keep bed in low position, maintain call bell within reach at all times, provide assistance with transfer out of bed and ambulation.  Medication Inspection Compliance: Pill count conducted under aseptic conditions, in front of the patient. Neither the pills nor the bottle was removed from the patient's sight at any time. Once count was completed pills were immediately returned to the patient in their original bottle.  Medication: Oxycodone IR Pill/Patch Count:  9 of 90 pills remain Pill/Patch Appearance: Markings consistent with prescribed medication Bottle Appearance: Standard pharmacy container. Clearly labeled. Filled Date: 79 / 17 / 2024 Last Medication intake:  YesterdaySafety precautions to be maintained throughout the outpatient stay will include: orient to surroundings, keep bed in low position, maintain call bell within reach at all times, provide assistance with transfer out of bed and ambulation.

## 2023-05-28 NOTE — Assessment & Plan Note (Signed)
Seeing hepatology Reviewed records and labs  Aware weight loss is treatment of choice  Interested in trial of GLP med for this and DM2

## 2023-05-28 NOTE — Patient Instructions (Addendum)
Keep walking   Add some strength training to your routine, this is important for bone and brain health and can reduce your risk of falls and help your body use insulin properly and regulate weight  Light weights, exercise bands , and internet videos are a good way to start  Yoga (chair or regular), machines , floor exercises or a gym with machines are also good options   Silver sneakers programs are excellent - check them out!    Call and schedule your annual diabetic eye exam   I will send a prescription for generic ozempic to your pharmacy  Let's see if it is covered or if another brand is covered  If it is available - we will start a low dose and then have you follow up a month later  I will review your liver doctor notes and see if a referral to rheumatology is needed We will let you know   Try to get most of your carbohydrates from produce (with the exception of white potatoes) and whole grains Eat less bread/pasta/rice/snack foods/cereals/sweets and other items from the middle of the grocery store (processed carbs)

## 2023-06-04 ENCOUNTER — Encounter: Payer: Self-pay | Admitting: Family Medicine

## 2023-06-04 ENCOUNTER — Ambulatory Visit (INDEPENDENT_AMBULATORY_CARE_PROVIDER_SITE_OTHER): Payer: Medicare HMO | Admitting: Family Medicine

## 2023-06-04 VITALS — BP 126/80 | HR 70 | Temp 98.5°F | Ht 63.0 in | Wt 236.4 lb

## 2023-06-04 DIAGNOSIS — K760 Fatty (change of) liver, not elsewhere classified: Secondary | ICD-10-CM

## 2023-06-04 DIAGNOSIS — E119 Type 2 diabetes mellitus without complications: Secondary | ICD-10-CM | POA: Diagnosis not present

## 2023-06-04 DIAGNOSIS — K219 Gastro-esophageal reflux disease without esophagitis: Secondary | ICD-10-CM | POA: Diagnosis not present

## 2023-06-04 DIAGNOSIS — Z7984 Long term (current) use of oral hypoglycemic drugs: Secondary | ICD-10-CM

## 2023-06-04 MED ORDER — PANTOPRAZOLE SODIUM 40 MG PO TBEC: 40.0000 mg | DELAYED_RELEASE_TABLET | Freq: Every day | ORAL | 1 refills | Status: DC

## 2023-06-04 NOTE — Assessment & Plan Note (Signed)
Worse recently  Known HH Gastritis in 2021 (no abd pain today)  This worsened before starting GLP as well  She did also reduce nsaid to every other day  Avoiding food triggers Working on weight loss  Reassuring exam today  Today filled protonix 40 mg daily (in past other ppis and H2 did not help) Instructed to take in am before food/vitamins  Update if not starting to improve in a week or if worsening   Call back and Er precautions noted in detail today  Handout given

## 2023-06-04 NOTE — Assessment & Plan Note (Signed)
Some acid reflux symptoms- started before GLP Pt thinks she is tolerating ozempic 0.25 mg weekly well so far Appetite is down a bit

## 2023-06-04 NOTE — Progress Notes (Signed)
Subjective:    Patient ID: Teresa Mcgee, female    DOB: Nov 29, 1965, 57 y.o.   MRN: [id number]  HPI  Wt Readings from Last 3 Encounters:  06/04/23 236 lb 6 oz (107.2 kg)  05/28/23 238 lb (108 kg)  05/28/23 238 lb 6 oz (108.1 kg)   41.87 kg/m  Vitals:   06/04/23 1355  BP: 126/80  Pulse: 70  Temp: 98.5 F (36.9 C)  SpO2: 97%     Pt presents for c/o indigestion  Sour taste in her mouth  Burning in her chest  No ST  Some hoarse voice  Started Tuesday (before starting the GLP med) No n/v or stool changes  No abd pain   Takes antacids  Bought over the counter omeprazole -helped   In past pepcid and nexium did not help  Protonix works the best  Has to avoid pizza and pasta with red sauce and bananas   EGD 2021- gastritis   Does take meloxicam- cut to every other day  Diabetic Lab Results  Component Value Date   HGBA1C 6.9 (A) 05/28/2023    GLP-1 medicine -semaglutide 0.25 mg weekly   (first dose Friday)  Feels full  Eating less  Metformin xr 500 mg bid   History of fatty liver   Oxycodone from pain management       Patient Active Problem List   Diagnosis Date Noted   Elevated antinuclear antibody (ANA) level 05/28/2023   Morbid obesity (HCC) 05/28/2023   Elevated CK 05/28/2023   Right shoulder pain 03/15/2023   Hypomagnesemia 02/21/2022   Hypokalemia 02/21/2022   Fatty liver 02/06/2022   Allergic conjunctivitis 12/01/2021   Elevated LFTs 11/22/2021   Medication monitoring encounter 11/22/2021   Fungal dermatitis 11/22/2021   Routine general medical examination at a health care facility 08/19/2021   Encounter for screening mammogram for breast cancer 08/19/2021   Chronic use of opiate for therapeutic purpose 11/04/2020   Uncomplicated opioid dependence (HCC) 11/04/2020   Ragged cuticle 09/23/2020   Diabetes mellitus treated with oral medication (HCC) 03/30/2019   Pain due to onychomycosis of toenails of both feet 03/03/2019    Cervicalgia 04/22/2018   Chronic musculoskeletal pain 04/22/2018   DDD (degenerative disc disease), thoracic 04/22/2018   DDD (degenerative disc disease), cervical 04/22/2018   Lumbar facet arthropathy (Bilateral) 04/22/2018   Lumbar facet syndrome (Bilateral) 04/22/2018   Chronic low back pain (1ry area of Pain) (Bilateral) (L>R) w/o sciatica 04/22/2018   Spondylosis without myelopathy or radiculopathy, cervical region 04/22/2018   Spondylosis without myelopathy or radiculopathy, lumbosacral region 04/22/2018   DDD (degenerative disc disease), lumbosacral 04/22/2018   Strain of lumbar paraspinal muscle, sequela 04/22/2018   Chronic hip pain (Bilateral) (R>L) 04/22/2018   Chronic sacroiliac joint pain (Left) 04/22/2018   Neurogenic pain 04/22/2018   Osteoarthritis involving multiple joints 04/22/2018   Chronic knee pain (Bilateral) (R>L) 04/22/2018   Chronic upper extremity pain (4th area of Pain) (Bilateral) (L>R) 02/27/2018   Vitamin D deficiency 02/27/2018   Chronic low back pain (Bilateral) (L>R) w/ sciatica (Bilateral) 01/24/2018   Chronic lower extremity pain (2ry area of Pain) (Bilateral) (L>R) 01/24/2018   Chronic neck pain (3ry area of Pain) (Bilateral) (L>R) 01/24/2018   Chronic pain syndrome 01/24/2018   Long term current use of opiate analgesic 01/24/2018   Disorder of skeletal system 01/24/2018   Nonallopathic lesion of head region 04/25/2012   Constipation 01/11/2012   Chest pain 01/11/2012   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   Numbness  and tingling in both hands 08/25/2011   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   Muscle spasms of neck 05/23/2011   Nasal mucositis (ulcerative) 05/23/2011   Fatigue 03/21/2011   Tobacco abuse 05/24/2010   Hypothyroidism 01/25/2010   HYPERTENSION, BENIGN ESSENTIAL 01/25/2010   Hyperlipidemia associated with type 2 diabetes mellitus (HCC) 12/31/2009   DEPRESSION 12/31/2009   GERD 12/31/2009   OSA (obstructive sleep apnea) 12/31/2009   Past  Medical History:  Diagnosis Date   Arthritis    Bell's palsy may 2012   Chest pain    Diabetes mellitus without complication (HCC)    GERD (gastroesophageal reflux disease)    H/O hiatal hernia    Hyperlipidemia    Hypertension    Hypothyroidism    Shingles    Shortness of breath    Sleep apnea    uses cpap   Past Surgical History:  Procedure Laterality Date   ABDOMINAL HYSTERECTOMY     CESAREAN SECTION     cesarian     3x   COLONOSCOPY WITH PROPOFOL N/A 11/27/2019   Procedure: COLONOSCOPY WITH PROPOFOL;  Surgeon: Wyline Mood, MD;  Location: [facility] ENDOSCOPY;  Service: Gastroenterology;  Laterality: N/A;   ESOPHAGOGASTRODUODENOSCOPY (EGD) WITH PROPOFOL N/A 07/20/2020   Procedure: ESOPHAGOGASTRODUODENOSCOPY (EGD) WITH PROPOFOL;  Surgeon: Wyline Mood, MD;  Location: [facility] ENDOSCOPY;  Service: Gastroenterology;  Laterality: N/A;   PARTIAL HYSTERECTOMY  2000   abnormal uterine bleeding   THYROIDECTOMY  1998   TOOTH EXTRACTION     TUBAL LIGATION     Social History   Tobacco Use   Smoking status: Every Day    Current packs/day: 0.50    Average packs/day: 0.5 packs/day for 20.0 years (10.0 ttl pk-yrs)    Types: Cigarettes   Smokeless tobacco: Never   Tobacco comments:    2-3 cigs daily-09/22/2020  Vaping Use   Vaping status: Never Used  Substance Use Topics   Alcohol use: No   Drug use: No   Family History  Problem Relation Age of Onset   Heart attack Father 48       deceased   Diabetes Brother    Stroke Brother 25       twice   Stroke Sister 45   Diabetes Sister    Heart attack Sister 37   Heart attack Brother 39   Breast cancer Maternal Aunt    Allergies  Allergen Reactions   Statins     myopathy   Current Outpatient Medications on File Prior to Visit  Medication Sig Dispense Refill   Accu-Chek FastClix Lancets MISC USE UP TO 4 TIMES DAILY AS DIRECTED 102 each 1   ACCU-CHEK GUIDE test strip USE UP TO 4 TIMES DAILY AS DIRECTED 100 strip 5   albuterol  (VENTOLIN HFA) 108 (90 Base) MCG/ACT inhaler INHALE 2 PUFFS INTO THE LUNGS EVERY 4 HOURS AS NEEDED FOR (COUGH, SHORTNESS OF BREATH OR WHEEZING) 8.5 each 2   amLODipine (NORVASC) 10 MG tablet TAKE 1 TABLET BY MOUTH EVERY DAY 90 tablet 2   azelastine (OPTIVAR) 0.05 % ophthalmic solution Place 1 drop into both eyes 2 (two) times daily as needed. 6 mL 3   blood glucose meter kit and supplies Dispense based on patient and insurance preference. Use up to four times daily as directed. (FOR ICD-10 E10.9, E11.9). 1 each 0   [facility] D3 125 MCG (5000 UT) capsule TAKE 1 CAPSULE BY MOUTH DAILY WITH BREAKFAST. TAKE ALONG WITH CALCIUM AND MAGNESIUM.  5   Evolocumab (REPATHA  SURECLICK) 140 MG/ML SOAJ Inject 140 mg into the skin every 14 (fourteen) days. 6 mL 3   ezetimibe (ZETIA) 10 MG tablet TAKE 1 TABLET BY MOUTH EVERY DAY 90 tablet 2   fluticasone (FLONASE) 50 MCG/ACT nasal spray SPRAY 2 SPRAYS INTO EACH NOSTRIL EVERY DAY 48 mL 1   gabapentin (NEURONTIN) 100 MG capsule TAKE 1 CAPSULE (100 MG TOTAL) BY MOUTH 2 (TWO) TIMES DAILY AND 3 CAPSULES (300 MG TOTAL) AT BEDTIME. 150 capsule 5   hydrochlorothiazide (HYDRODIURIL) 25 MG tablet TAKE 1 TABLET (25 MG TOTAL) BY MOUTH DAILY. AS DIRECTED 90 tablet 1   levocetirizine (XYZAL) 5 MG tablet TAKE 1 TABLET BY MOUTH EVERY DAY IN THE EVENING 90 tablet 1   levothyroxine (SYNTHROID) 112 MCG tablet Take 1 tablet (112 mcg total) by mouth daily before breakfast. 90 tablet 1   Magnesium Oxide -Mg Supplement 500 MG CAPS TAKE 1 CAPSULE BY MOUTH TWICE A DAY (AT 8AM & 10PM) 180 capsule 0   meloxicam (MOBIC) 15 MG tablet TAKE 1 TABLET BY MOUTH EVERY DAY WITH FOOD AS NEEDED FOR PAIN 90 tablet 0   metFORMIN (GLUCOPHAGE-XR) 500 MG 24 hr tablet Take 1 tablet (500 mg total) by mouth 2 (two) times daily with a meal. 180 tablet 2   naloxone (NARCAN) nasal spray 4 mg/0.1 mL Place 1 spray into the nose as needed for up to 365 doses (for opioid-induced respiratory depresssion). In case of emergency  (overdose), spray once into each nostril. If no response within 3 minutes, repeat application and call 911. 1 each 0   Olopatadine HCl 0.2 % SOLN INSTILL 1 DROP INTO BOTH EYES EVERY DAY AS NEEDED     oxyCODONE (OXY IR/ROXICODONE) 5 MG immediate release tablet Take 1 tablet (5 mg total) by mouth every 8 (eight) hours as needed for severe pain (pain score 7-10). Must last 30 days 90 tablet 0   [START ON 06/29/2023] oxyCODONE (OXY IR/ROXICODONE) 5 MG immediate release tablet Take 1 tablet (5 mg total) by mouth every 8 (eight) hours as needed for severe pain (pain score 7-10). Must last 30 days 90 tablet 0   [START ON 07/29/2023] oxyCODONE (OXY IR/ROXICODONE) 5 MG immediate release tablet Take 1 tablet (5 mg total) by mouth every 8 (eight) hours as needed for severe pain (pain score 7-10). Must last 30 days 90 tablet 0   potassium chloride (KLOR-CON) 10 MEQ tablet TAKE 1 TABLET BY MOUTH EVERY DAY 90 tablet 1   REFRESH CELLUVISC 1 % GEL APPLY 1 DROP TO EYE 3 (THREE) TIMES DAILY.  12   Semaglutide,0.25 or 0.5MG /DOS, (OZEMPIC, 0.25 OR 0.5 MG/DOSE,) 2 MG/3ML SOPN Inject 0.25 mg into the skin once a week. 2 mL 0   tiZANidine (ZANAFLEX) 4 MG tablet Take 1 tablet (4 mg total) by mouth every 8 (eight) hours as needed for muscle spasms. 270 tablet 1   No current facility-administered medications on file prior to visit.    Review of Systems  Constitutional:  Negative for activity change, appetite change, fatigue, fever and unexpected weight change.  HENT:  Positive for voice change. Negative for congestion, ear pain, rhinorrhea, sinus pressure and sore throat.   Eyes:  Negative for pain, redness and visual disturbance.  Respiratory:  Negative for cough, shortness of breath and wheezing.   Cardiovascular:  Negative for chest pain and palpitations.  Gastrointestinal:  Negative for abdominal pain, blood in stool, constipation, diarrhea, nausea and vomiting.       Heartburn Acid in  mouth    Endocrine: Negative  for polydipsia and polyuria.  Genitourinary:  Negative for dysuria, frequency and urgency.  Musculoskeletal:  Positive for arthralgias and back pain. Negative for myalgias.  Skin:  Negative for pallor and rash.  Allergic/Immunologic: Negative for environmental allergies.  Neurological:  Negative for dizziness, syncope and headaches.  Hematological:  Negative for adenopathy. Does not bruise/bleed easily.  Psychiatric/Behavioral:  Negative for decreased concentration and dysphoric mood. The patient is not nervous/anxious.        Objective:   Physical Exam Constitutional:      General: She is not in acute distress.    Appearance: Normal appearance. She is well-developed. She is obese. She is not ill-appearing or diaphoretic.  HENT:     Head: Normocephalic and atraumatic.  Eyes:     Conjunctiva/sclera: Conjunctivae normal.     Pupils: Pupils are equal, round, and reactive to light.  Neck:     Thyroid: No thyromegaly.     Vascular: No carotid bruit or JVD.  Cardiovascular:     Rate and Rhythm: Normal rate and regular rhythm.     Heart sounds: Normal heart sounds.     No gallop.  Pulmonary:     Effort: Pulmonary effort is normal. No respiratory distress.     Breath sounds: Normal breath sounds. No stridor. No wheezing, rhonchi or rales.  Abdominal:     General: Abdomen is protuberant. Bowel sounds are normal. There is no distension or abdominal bruit.     Palpations: Abdomen is soft. There is no fluid wave, hepatomegaly, splenomegaly, mass or pulsatile mass.     Tenderness: There is abdominal tenderness in the epigastric area. There is no right CVA tenderness, left CVA tenderness, guarding or rebound. Negative signs include Murphy's sign and McBurney's sign.     Comments: Mild epigastric tenderness   Musculoskeletal:     Cervical back: Normal range of motion and neck supple.     Right lower leg: No edema.     Left lower leg: No edema.  Lymphadenopathy:     Cervical: No cervical  adenopathy.  Skin:    General: Skin is warm and dry.     Coloration: Skin is not pale.     Findings: No rash.  Neurological:     Mental Status: She is alert.     Coordination: Coordination normal.     Deep Tendon Reflexes: Reflexes are normal and symmetric. Reflexes normal.  Psychiatric:        Mood and Affect: Mood normal.           Assessment & Plan:   Problem List Items Addressed This Visit       Digestive   Fatty liver    Working on weight loss On GLP Sees hepatology      GERD - Primary    Worse recently  Known HH Gastritis in 2021 (no abd pain today)  This worsened before starting GLP as well  She did also reduce nsaid to every other day  Avoiding food triggers Working on weight loss  Reassuring exam today  Today filled protonix 40 mg daily (in past other ppis and H2 did not help) Instructed to take in am before food/vitamins  Update if not starting to improve in a week or if worsening   Call back and Er precautions noted in detail today  Handout given        Relevant Medications   pantoprazole (PROTONIX) 40 MG tablet     Endocrine  Diabetes mellitus treated with oral medication (HCC)    Some acid reflux symptoms- started before GLP Pt thinks she is tolerating ozempic 0.25 mg weekly well so far Appetite is down a bit

## 2023-06-04 NOTE — Assessment & Plan Note (Signed)
Working on [employer] loss On GLP Sees hepatology

## 2023-06-04 NOTE — Patient Instructions (Signed)
Keep watching diet for things that flare you   Take the protonix one daily in the am at least 30 minutes before you eat or take any medicines or vitamins   Minimize the meloxicam when you can  Update if not starting to improve in a week or if worsening

## 2023-06-23 ENCOUNTER — Other Ambulatory Visit: Payer: Self-pay | Admitting: Family Medicine

## 2023-06-23 DIAGNOSIS — M150 Primary generalized (osteo)arthritis: Secondary | ICD-10-CM

## 2023-06-25 NOTE — Telephone Encounter (Signed)
Last time she was seen we discussed (for GERD and gastritis) taking her nsaid every other day instead of daily (it is hard on stomach and can worsen these issues)  Has she been able to do this?  I need to know for the refill Thanks

## 2023-06-25 NOTE — Telephone Encounter (Signed)
Last filled on 03/26/23 #90 tabs/ 0 refills Last OV was for GERD on 06/04/23

## 2023-06-25 NOTE — Telephone Encounter (Signed)
Pt said she has been taking it every other day.

## 2023-07-10 ENCOUNTER — Telehealth: Payer: Self-pay | Admitting: Family Medicine

## 2023-07-10 ENCOUNTER — Other Ambulatory Visit: Payer: Self-pay | Admitting: Family Medicine

## 2023-07-10 NOTE — Telephone Encounter (Signed)
How is she tolerating the 0.25 mg weekly? Does she want to increase to the 0.5 ?

## 2023-07-10 NOTE — Telephone Encounter (Signed)
Last filled on 05/28/23 #2 mL with 0 refill, DM f/u was also on 05/28/23

## 2023-07-10 NOTE — Telephone Encounter (Signed)
Prescription Request  07/10/2023  LOV: 06/04/2023  What is the name of the medication or equipment? Semaglutide,0.25 or 0.5MG /DOS, (OZEMPIC, 0.25 OR 0.5 MG/DOSE,) 2 MG/3ML SOPN   Have you contacted your pharmacy to request a refill? Yes   Which pharmacy would you like this sent to?  [id number], [place] - [address] [place] [address] Phone: [phone] Fax: [phone]    Patient notified that their request is being sent to the clinical staff for review and that they should receive a response within 2 business days.   Please advise at Mobile [phone] (mobile)

## 2023-07-11 MED ORDER — OZEMPIC (0.25 OR 0.5 MG/DOSE) 2 MG/3ML ~~LOC~~ SOPN: 0.5000 mg | PEN_INJECTOR | SUBCUTANEOUS | 0 refills | Status: DC

## 2023-07-11 NOTE — Telephone Encounter (Signed)
error 

## 2023-07-11 NOTE — Telephone Encounter (Signed)
Error

## 2023-07-11 NOTE — Telephone Encounter (Signed)
Called patient reviewed information she would like to go up to the 0.5mg . she is denies any side effects on the 0.25

## 2023-07-16 DIAGNOSIS — E119 Type 2 diabetes mellitus without complications: Secondary | ICD-10-CM | POA: Diagnosis not present

## 2023-07-16 LAB — HM DIABETES EYE EXAM

## 2023-07-30 ENCOUNTER — Telehealth: Payer: Self-pay | Admitting: Internal Medicine

## 2023-07-30 NOTE — Telephone Encounter (Signed)
Pt c/o medication issue:  1. Name of Medication:  Evolocumab (REPATHA SURECLICK) 140 MG/ML SOAJ   2. How are you currently taking this medication (dosage and times per day)? Was due for injection yesterday, 12/15.  3. Are you having a reaction (difficulty breathing--STAT)? No   4. What is your medication issue? Injection did not work. She reports two new are being sent and was advised by the company they will be contacting our office regarding it. Please advise.

## 2023-07-30 NOTE — Telephone Encounter (Signed)
Called and spoke to patient who's calling about her Repatha. She's calling to inform the office that the pharmacy will be reaching out to the office to get an order to replace 4 of her Repatha injections. Patient stated she tried all 4 of the Repatha injections she had and none of them worked.

## 2023-07-31 ENCOUNTER — Other Ambulatory Visit: Payer: Self-pay | Admitting: Family Medicine

## 2023-07-31 DIAGNOSIS — J302 Other seasonal allergic rhinitis: Secondary | ICD-10-CM

## 2023-08-01 NOTE — Telephone Encounter (Signed)
Verbal given to [facility] for replacement.

## 2023-08-02 DIAGNOSIS — R7989 Other specified abnormal findings of blood chemistry: Secondary | ICD-10-CM | POA: Diagnosis not present

## 2023-08-02 DIAGNOSIS — R768 Other specified abnormal immunological findings in serum: Secondary | ICD-10-CM | POA: Diagnosis not present

## 2023-08-02 DIAGNOSIS — H04123 Dry eye syndrome of bilateral lacrimal glands: Secondary | ICD-10-CM | POA: Diagnosis not present

## 2023-08-02 DIAGNOSIS — R682 Dry mouth, unspecified: Secondary | ICD-10-CM | POA: Diagnosis not present

## 2023-08-02 DIAGNOSIS — R748 Abnormal levels of other serum enzymes: Secondary | ICD-10-CM | POA: Diagnosis not present

## 2023-08-06 ENCOUNTER — Telehealth: Payer: Self-pay

## 2023-08-06 NOTE — Telephone Encounter (Signed)
Aware/ thanks  Keep [place] posted if symptoms return

## 2023-08-06 NOTE — Telephone Encounter (Signed)
I spoke with pt; pt did not go to UC; pt had vomiting and diarrhea for only one day., pt is up and about today. Pt will cb if needed. Sending note to Dr Milinda Antis as Lorain Childes.

## 2023-08-07 ENCOUNTER — Other Ambulatory Visit: Payer: Self-pay | Admitting: Family Medicine

## 2023-08-07 NOTE — Telephone Encounter (Signed)
Last filled on 07/11/23 # 2 mL/ 0 refill   Last OV was dm f/u on 05/28/23

## 2023-08-07 NOTE — Telephone Encounter (Signed)
Refilled  Please schedule follow up for dm after mid January

## 2023-08-09 NOTE — Telephone Encounter (Signed)
Spoke to pt, scheduled appt for 09/10/23

## 2023-08-22 ENCOUNTER — Other Ambulatory Visit: Payer: Self-pay | Admitting: Family Medicine

## 2023-08-23 ENCOUNTER — Encounter: Payer: Self-pay | Admitting: Podiatry

## 2023-08-23 ENCOUNTER — Ambulatory Visit (INDEPENDENT_AMBULATORY_CARE_PROVIDER_SITE_OTHER): Payer: Medicare HMO | Admitting: Podiatry

## 2023-08-23 VITALS — Ht 63.0 in | Wt 236.4 lb

## 2023-08-23 DIAGNOSIS — M79675 Pain in left toe(s): Secondary | ICD-10-CM

## 2023-08-23 DIAGNOSIS — B351 Tinea unguium: Secondary | ICD-10-CM | POA: Diagnosis not present

## 2023-08-23 DIAGNOSIS — E119 Type 2 diabetes mellitus without complications: Secondary | ICD-10-CM | POA: Diagnosis not present

## 2023-08-23 DIAGNOSIS — M79674 Pain in right toe(s): Secondary | ICD-10-CM | POA: Diagnosis not present

## 2023-08-24 ENCOUNTER — Other Ambulatory Visit: Payer: Self-pay | Admitting: Family Medicine

## 2023-08-24 ENCOUNTER — Ambulatory Visit: Payer: Medicare HMO | Attending: Internal Medicine | Admitting: Internal Medicine

## 2023-08-24 VITALS — Ht 63.0 in | Wt 210.0 lb

## 2023-08-24 DIAGNOSIS — K719 Toxic liver disease, unspecified: Secondary | ICD-10-CM

## 2023-08-24 DIAGNOSIS — K7581 Nonalcoholic steatohepatitis (NASH): Secondary | ICD-10-CM | POA: Diagnosis not present

## 2023-08-24 DIAGNOSIS — T466X5A Adverse effect of antihyperlipidemic and antiarteriosclerotic drugs, initial encounter: Secondary | ICD-10-CM

## 2023-08-24 DIAGNOSIS — I739 Peripheral vascular disease, unspecified: Secondary | ICD-10-CM | POA: Diagnosis not present

## 2023-08-24 DIAGNOSIS — E119 Type 2 diabetes mellitus without complications: Secondary | ICD-10-CM

## 2023-08-24 DIAGNOSIS — E785 Hyperlipidemia, unspecified: Secondary | ICD-10-CM | POA: Diagnosis not present

## 2023-08-24 NOTE — Progress Notes (Signed)
 Virtual Visit via Video Note   This visit type was conducted due to national recommendations for restrictions regarding the COVID-19 Pandemic (e.g. social distancing) in an effort to limit this patient's exposure and mitigate transmission in our community.  Due to her co-morbid illnesses, this patient is at least at moderate risk for complications without adequate follow up.  This format is felt to be most appropriate for this patient at this time.  The patient does have access to video technology  All issues noted in this document were discussed and addressed.  A limited physical exam could be performed with this format.  Please refer to the patient's chart for her  consent to telehealth for Teresa Mcgee.   Date:  08/24/2023   ID:  Teresa Mcgee, DOB 06/22/1966, MRN [id number] The patient was identified using 2 identifiers.  Evaluation Performed:  Follow-Up Visit  Patient Location:  [address] Teresa Mcgee [place] [address]  Provider location:   [address] [place], [place] [address]  PCP:  Randeen Laine LABOR, MD  Cardiologist:  None Electrophysiologist:  None   Chief Complaint: Follow-up dyslipidemia  History of Present Illness:    Teresa Mcgee is a 58 y.o. female who presents via audio/video conferencing for a telehealth visit today.  This is a pleasant 58 year old female with type 2 diabetes, dyslipidemia, hypertension and history of statin intolerance to both atorvastatin  and simvastatin , who presents for evaluation and management of dyslipidemia.  She recently had a lipid profile which demonstrated a total cholesterol of 204, triglycerides 117, HDL 46 and LDL 134.  Hemoglobin A1c is 5.8.  She has no known history of heart disease although she had a brother and father both who had heart disease in their 64s.  A CT scan of the abdomen and pelvis was done along with a CT angio of the chest in October 2019 for shortness of breath and upper abdominal pain,  this did not suggest any coronary artery calcification however there is evidence for peripheral calcified atherosclerotic plaque.  Based on this, she does need aggressive lipid-lowering and would target an LDL to less than 70.  Unfortunately she has not been able to tolerate statins having myalgias.  She is hesitant to try another statin at this point.  She was placed on ezetimibe  however her cholesterol remains above target.  We discussed options today.  11/24/2020  Ms. Mcgee seems to be doing well on Nexletol .  She is tolerating it without any significant side effects.  Repeat labs 6 days ago showed total cholesterol 143, triglycerides 175, HDL 34 and LDL at 79.  This is just slightly above goal although she has been more active recently and has made some dietary changes.  I suspect hopefully over the next several months that her cholesterol will become even lower.  09/19/2021  Teresa Mcgee is seen today in follow-up.  Unfortunately her lipids have gone up in the past 6 months.  We are hopeful that with dietary changes and weight loss she would reach her target on Nexletol  and ezetimibe , however her most recent lipids demonstrated total cholesterol 167, triglycerides 164, HDL 34 and LDL 100.  Her goal LDL is less than 70 given PAD.  Her TSH was also a little suppressed therefore her thyroid  medication was reduced.  This would actually be usually associated with a lower than expected cholesterol.  Therefore I anticipated going higher.  She will need additional therapy.  01/24/2022  Teresa Mcgee is seen  today in follow-up.  She reports she is tolerating rosuvastatin  5 mg every other day in addition to ezetimibe  and Nexletol .  She did not have her lipids reassessed.  She will plan to get them done in [place] in the next few days.  She is wondering if she needs to continue all of them.  Overall I think if she is tolerating it well we could consider combining the Nexletol  and Zetia  use her pill  burden.  07/31/2022  Teresa Mcgee returns today for follow-up.  Since I last saw her she has had an increase in her liver enzymes.  The slow rise now shows AST 133 and ALT 86.  Based on this and the fact that her LDL was well-controlled in July at 46, advised her to stop her every other day rosuvastatin  but to continue ezetimibe  and Nexletol .  She did see a gastroenterologist and had an autoimmune workup.  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  She may undergo liver biopsy in February.  Since she has now been off of her statin.  I advised repeating her lipid profile and her liver enzymes.  01/17/2023  Teresa Mcgee is seen today in follow-up.  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  She has been referred to [facility] for management options.  She has had minimal improvement in liver enzymes after stopping her statin.  This makes sense if there is an underlying autoimmune mechanism.  Statin however could worsen her hepatitis as could the Nexletol  and to a much lesser extent ezetimibe .  Based on that however stopping those medications would lead to higher cholesterol which is already well above her target LDL less than 70.  She will therefore need additional therapy and would do much better in my opinion on a PCSK9 inhibitor, medication less likely to cause any worsening hepatitis.  08/24/2023  Teresa Mcgee is seen today in follow-up.  She was seen at [facility] and also at [facility] for workup of hepatitis.  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  She has come off of her statin and Nexletol  but continues on Zetia .  I had started her on Repatha  which she felt was helpful however she ran into an issue with the pens that apparently did not work.  She received several different injectors and ultimately contacted the company and returned them but has  not received a replacement.  She has not had repeat lipid testing on therapy.  Prior CV studies:   The following studies were reviewed today:  Chart reviewed, lab work reviewed  PMHx:  Past Medical History:  Diagnosis Date   Arthritis    Bell's palsy may 2012   Chest pain    Diabetes mellitus without complication (HCC)    GERD (gastroesophageal reflux disease)    H/O hiatal hernia    Hyperlipidemia    Hypertension    Hypothyroidism    Shingles    Shortness of breath    Sleep apnea    uses cpap    Past Surgical History:  Procedure Laterality Date   ABDOMINAL HYSTERECTOMY     CESAREAN SECTION     cesarian     3x   COLONOSCOPY WITH PROPOFOL  N/A 11/27/2019   Procedure: COLONOSCOPY WITH PROPOFOL ;  Surgeon: Therisa Bi, MD;  Location: [facility] ENDOSCOPY;  Service: Gastroenterology;  Laterality: N/A;   ESOPHAGOGASTRODUODENOSCOPY (EGD) WITH  PROPOFOL  N/A 07/20/2020   Procedure: ESOPHAGOGASTRODUODENOSCOPY (EGD) WITH PROPOFOL ;  Surgeon: Therisa Bi, MD;  Location: [facility] ENDOSCOPY;  Service: Gastroenterology;  Laterality: N/A;   PARTIAL HYSTERECTOMY  2000   abnormal uterine bleeding   THYROIDECTOMY  1998   TOOTH EXTRACTION     TUBAL LIGATION      FAMHx:  Family History  Problem Relation Age of Onset   Heart attack Father 25       deceased   Diabetes Brother    Stroke Brother 25       twice   Stroke Sister 45   Diabetes Sister    Heart attack Sister 12   Heart attack Brother 39   Breast cancer Maternal Aunt     SOCHx:   reports that she has been smoking cigarettes. She has a 10 pack-year smoking history. She has never used smokeless tobacco. She reports that she does not drink alcohol and does not use drugs.  ALLERGIES:  Allergies  Allergen Reactions   Statins     myopathy    MEDS:  Current Meds  Medication Sig   Accu-Chek FastClix Lancets MISC USE UP TO 4 TIMES DAILY AS DIRECTED   ACCU-CHEK GUIDE test strip USE UP TO 4 TIMES DAILY AS DIRECTED   albuterol   (VENTOLIN  HFA) 108 (90 Base) MCG/ACT inhaler INHALE 2 PUFFS INTO THE LUNGS EVERY 4 HOURS AS NEEDED FOR (COUGH, SHORTNESS OF BREATH OR WHEEZING)   amLODipine  (NORVASC ) 10 MG tablet TAKE 1 TABLET BY MOUTH EVERY DAY   azelastine  (OPTIVAR ) 0.05 % ophthalmic solution Place 1 drop into both eyes 2 (two) times daily as needed.   blood glucose meter kit and supplies Dispense based on patient and insurance preference. Use up to four times daily as directed. (FOR ICD-10 E10.9, E11.9).   [employer] D3 125 MCG (5000 UT) capsule TAKE 1 CAPSULE BY MOUTH DAILY WITH BREAKFAST. TAKE ALONG WITH CALCIUM  AND MAGNESIUM .   Evolocumab  (REPATHA  SURECLICK) 140 MG/ML SOAJ Inject 140 mg into the skin every 14 (fourteen) days.   ezetimibe  (ZETIA ) 10 MG tablet TAKE 1 TABLET BY MOUTH EVERY DAY   fluticasone  (FLONASE ) 50 MCG/ACT nasal spray SPRAY 2 SPRAYS INTO EACH NOSTRIL EVERY DAY   gabapentin  (NEURONTIN ) 100 MG capsule TAKE 1 CAPSULE (100 MG TOTAL) BY MOUTH 2 (TWO) TIMES DAILY AND 3 CAPSULES (300 MG TOTAL) AT BEDTIME.   hydrochlorothiazide  (HYDRODIURIL ) 25 MG tablet TAKE 1 TABLET (25 MG TOTAL) BY MOUTH DAILY. AS DIRECTED   levocetirizine (XYZAL ) 5 MG tablet TAKE 1 TABLET BY MOUTH EVERY DAY IN THE EVENING   levothyroxine  (SYNTHROID ) 112 MCG tablet TAKE 1 TABLET BY MOUTH DAILY BEFORE BREAKFAST.   Magnesium  Oxide -Mg Supplement 500 MG CAPS TAKE 1 CAPSULE BY MOUTH TWICE A DAY (AT 8AM & 10PM)   meloxicam  (MOBIC ) 15 MG tablet Take one pill by mouth with food every other day as needed for pain   metFORMIN  (GLUCOPHAGE -XR) 500 MG 24 hr tablet Take 1 tablet (500 mg total) by mouth 2 (two) times daily with a meal.   naloxone  (NARCAN ) nasal spray 4 mg/0.1 mL Place 1 spray into the nose as needed for up to 365 doses (for opioid-induced respiratory depresssion). In case of emergency (overdose), spray once into each nostril. If no response within 3 minutes, repeat application and call 911.   Olopatadine HCl 0.2 % SOLN INSTILL 1 DROP INTO BOTH EYES  EVERY DAY AS NEEDED   oxyCODONE  (OXY IR/ROXICODONE ) 5 MG immediate release tablet Take 1 tablet (5  mg total) by mouth every 8 (eight) hours as needed for severe pain (pain score 7-10). Must last 30 days   pantoprazole  (PROTONIX ) 40 MG tablet Take 1 tablet (40 mg total) by mouth daily.   potassium chloride  (KLOR-CON ) 10 MEQ tablet TAKE 1 TABLET BY MOUTH EVERY DAY   REFRESH CELLUVISC 1 % GEL APPLY 1 DROP TO EYE 3 (THREE) TIMES DAILY.   Semaglutide ,0.25 or 0.5MG /DOS, (OZEMPIC , 0.25 OR 0.5 MG/DOSE,) 2 MG/3ML SOPN INJECT 0.5 MG INTO THE SKIN ONE TIME PER WEEK   tiZANidine  (ZANAFLEX ) 4 MG tablet Take 1 tablet (4 mg total) by mouth every 8 (eight) hours as needed for muscle spasms.     ROS: Pertinent items noted in HPI and remainder of comprehensive ROS otherwise negative.  Labs/Other Tests and Data Reviewed:    Recent Labs: 12/01/2022: TSH 0.78 12/29/2022: ALT 112; BUN 12; Creatinine, Ser 0.89; Hemoglobin 13.9; Platelets 187; Potassium 3.4; Sodium 140   Recent Lipid Panel Lab Results  Component Value Date/Time   CHOL 158 12/01/2022 11:50 AM   CHOL 143 11/18/2020 10:55 AM   TRIG 263.0 (H) 12/01/2022 11:50 AM   HDL 33.00 (L) 12/01/2022 11:50 AM   HDL 34 (L) 11/18/2020 10:55 AM   CHOLHDL 5 12/01/2022 11:50 AM   LDLCALC 81 08/01/2022 12:34 PM   LDLCALC 79 11/18/2020 10:55 AM   LDLCALC 159 (H) 11/16/2017 03:29 PM   LDLDIRECT 103.0 12/01/2022 11:50 AM    Wt Readings from Last 3 Encounters:  08/24/23 210 lb (95.3 kg)  08/23/23 236 lb 6.1 oz (107.2 kg)  06/04/23 236 lb 6 oz (107.2 kg)     Exam:    Vital Signs:  Ht 5' 3 (1.6 m)   Wt 210 lb (95.3 kg)   LMP  (LMP Unknown)   BMI 37.20 kg/m    General appearance: alert, no distress and morbidly obese Lungs: No visual respiratory difficulty Abdomen: Obese Extremities: extremities normal, atraumatic, no cyanosis or edema Skin: Skin color, texture, turgor normal. 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 Zertuche seems to be tolerating Repatha  however had issues with the injectors.  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  The pens were sent back but she never received replacements.  She has been off of therapy for 6 weeks.  Will work to try to get her replacements for this.  I will reach out to our field representative.  Once she gets doses if they are successful would recommend repeating lipids probably in 3 months and follow-up with me afterwards.  Will also recheck her liver enzymes.  COVID-19 Education: The signs and symptoms of COVID-19 were discussed with the patient and how to seek care for testing (follow up with PCP or arrange E-visit).  The importance of social distancing was discussed today.  Patient Risk:   After full review of this patients clinical status, I feel that they are at least moderate risk at this time.  Time:   Today, I have spent 15 minutes with the patient with telehealth technology discussing dyslipidemia, goal LDL cholesterol.     Medication Adjustments/Labs and Tests Ordered: Current medicines are reviewed at length with the patient today.  Concerns regarding medicines are outlined above.   Tests Ordered: No orders of  the defined types were placed in this encounter.   Medication Changes: No orders of the defined types were placed in this encounter.   Disposition:  in 3 month(s)  Vinie KYM Maxcy, MD, [facility], FACP  [facility]  Medical Director of the Advanced Lipid Disorders &  Cardiovascular Risk Reduction Clinic Diplomate of the American Board of Clinical Lipidology Attending Cardiologist  Direct Dial: [phone]  Fax: [phone]  Website:   [email or web address]  Vinie JAYSON Maxcy, MD  08/24/2023 8:02 AM

## 2023-08-24 NOTE — Patient Instructions (Signed)
 Medication Instructions:  NO CHANGES  RESUME REPATHA   *If you need a refill on your cardiac medications before your next appointment, please call your pharmacy*   Lab Work: FASTING lab work in 3-4 months Lipid Panel, Hepatic Function Panel   If you have labs (blood work) drawn today and your tests are completely normal, you will receive your results only by: MyChart Message (if you have MyChart) OR A paper copy in the mail If you have any lab test that is abnormal or we need to change your treatment, we will call you to review the results.   Follow-Up: At [facility], you and your health needs are our priority.  As part of our continuing mission to provide you with exceptional heart care, we have created designated Provider Care Teams.  These Care Teams include your primary Cardiologist (physician) and Advanced Practice Providers (APPs -  Physician Assistants and Nurse Practitioners) who all work together to provide you with the care you need, when you need it.  We recommend signing up for the patient portal called MyChart.  Sign up information is provided on this After Visit Summary.  MyChart is used to connect with patients for Virtual Visits (Telemedicine).  Patients are able to view lab/test results, encounter notes, upcoming appointments, etc.  Non-urgent messages can be sent to your provider as well.   To learn more about what you can do with MyChart, go to [email or web address].    Your next appointment:   3-4 months with Dr. Mona - lipid clinic  Video Visit

## 2023-08-26 NOTE — Patient Instructions (Signed)
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
## 2023-08-27 ENCOUNTER — Ambulatory Visit: Payer: Medicare HMO | Attending: Pain Medicine | Admitting: Pain Medicine

## 2023-08-27 ENCOUNTER — Ambulatory Visit (INDEPENDENT_AMBULATORY_CARE_PROVIDER_SITE_OTHER): Payer: Medicare HMO | Admitting: Family Medicine

## 2023-08-27 ENCOUNTER — Ambulatory Visit: Payer: Self-pay | Admitting: Family Medicine

## 2023-08-27 ENCOUNTER — Encounter: Payer: Self-pay | Admitting: Family Medicine

## 2023-08-27 ENCOUNTER — Encounter: Payer: Self-pay | Admitting: Pain Medicine

## 2023-08-27 VITALS — BP 114/70 | HR 78 | Temp 98.1°F | Ht 63.0 in | Wt 228.1 lb

## 2023-08-27 DIAGNOSIS — F1120 Opioid dependence, uncomplicated: Secondary | ICD-10-CM | POA: Diagnosis present

## 2023-08-27 DIAGNOSIS — Z79891 Long term (current) use of opiate analgesic: Secondary | ICD-10-CM | POA: Diagnosis present

## 2023-08-27 DIAGNOSIS — M533 Sacrococcygeal disorders, not elsewhere classified: Secondary | ICD-10-CM

## 2023-08-27 DIAGNOSIS — M79602 Pain in left arm: Secondary | ICD-10-CM | POA: Diagnosis not present

## 2023-08-27 DIAGNOSIS — G894 Chronic pain syndrome: Secondary | ICD-10-CM | POA: Diagnosis present

## 2023-08-27 DIAGNOSIS — Z79899 Other long term (current) drug therapy: Secondary | ICD-10-CM

## 2023-08-27 DIAGNOSIS — R062 Wheezing: Secondary | ICD-10-CM | POA: Diagnosis not present

## 2023-08-27 DIAGNOSIS — M79601 Pain in right arm: Secondary | ICD-10-CM

## 2023-08-27 DIAGNOSIS — M79605 Pain in left leg: Secondary | ICD-10-CM | POA: Insufficient documentation

## 2023-08-27 DIAGNOSIS — M542 Cervicalgia: Secondary | ICD-10-CM

## 2023-08-27 DIAGNOSIS — M79604 Pain in right leg: Secondary | ICD-10-CM | POA: Diagnosis not present

## 2023-08-27 DIAGNOSIS — M25562 Pain in left knee: Secondary | ICD-10-CM | POA: Diagnosis not present

## 2023-08-27 DIAGNOSIS — R059 Cough, unspecified: Secondary | ICD-10-CM | POA: Diagnosis not present

## 2023-08-27 DIAGNOSIS — J101 Influenza due to other identified influenza virus with other respiratory manifestations: Secondary | ICD-10-CM

## 2023-08-27 DIAGNOSIS — M25551 Pain in right hip: Secondary | ICD-10-CM

## 2023-08-27 DIAGNOSIS — M47816 Spondylosis without myelopathy or radiculopathy, lumbar region: Secondary | ICD-10-CM | POA: Diagnosis not present

## 2023-08-27 DIAGNOSIS — M545 Low back pain, unspecified: Secondary | ICD-10-CM | POA: Diagnosis not present

## 2023-08-27 DIAGNOSIS — G8929 Other chronic pain: Secondary | ICD-10-CM | POA: Insufficient documentation

## 2023-08-27 DIAGNOSIS — M25561 Pain in right knee: Secondary | ICD-10-CM | POA: Diagnosis not present

## 2023-08-27 DIAGNOSIS — M25552 Pain in left hip: Secondary | ICD-10-CM

## 2023-08-27 LAB — POC INFLUENZA A&B (BINAX/QUICKVUE)
Influenza A, POC: POSITIVE — AB
Influenza B, POC: NEGATIVE

## 2023-08-27 LAB — POC COVID19 BINAXNOW: SARS Coronavirus 2 Ag: NEGATIVE

## 2023-08-27 MED ORDER — PREDNISONE 20 MG PO TABS: ORAL_TABLET | ORAL | 0 refills | Status: DC

## 2023-08-27 MED ORDER — ALBUTEROL SULFATE (2.5 MG/3ML) 0.083% IN NEBU
2.5000 mg | INHALATION_SOLUTION | Freq: Once | RESPIRATORY_TRACT | Status: AC
  Administered 2023-08-27: 2.5 mg via RESPIRATORY_TRACT

## 2023-08-27 MED ORDER — OXYCODONE HCL 5 MG PO TABS: 5.0000 mg | ORAL_TABLET | Freq: Three times a day (TID) | ORAL | 0 refills | Status: DC | PRN

## 2023-08-27 MED ORDER — ALBUTEROL SULFATE HFA 108 (90 BASE) MCG/ACT IN AERS: 2.0000 | INHALATION_SPRAY | RESPIRATORY_TRACT | 0 refills | Status: DC | PRN

## 2023-08-27 NOTE — Telephone Encounter (Signed)
 Copied from CRM [id number]. Topic: Clinical - Pink Word Triage >> Aug 27, 2023  8:41 AM Victoria A wrote: Reason for Triage: Patient has had cough last Wednesday, wheezing -Pt believes she has Bronchitis   Chief Complaint: Cough Symptoms: wheezing and body aches Frequency: progressive Pertinent Negatives: Patient denies fever Disposition: [] ED /[] Urgent Care (no appt availability in office) / [x] Appointment(In office/virtual)/ []  [facility]/ [] Home Care/ [] Refused Recommended Disposition /[] [facility]/ []  Follow-up with PCP Additional Notes: Pt reports cough x 5 days, now with wheezing and mild shortness of breath. Pt endorses history of bronchitis. Pt scheduled for today with Dr. Watt at 2pm   Reason for Disposition  [1] MILD difficulty breathing (e.g., minimal/no SOB at rest, SOB with walking, pulse <100) AND [2] still present when not coughing  Answer Assessment - Initial Assessment Questions 1. ONSET: When did the cough begin?      Started 5 days ago 2. SEVERITY: How bad is the cough today?      States that she's having a bronchitis episode  3. SPUTUM: Describe the color of your sputum (none, dry cough; clear, white, yellow, green)     Clear  4. HEMOPTYSIS: Are you coughing up any blood? If so ask: How much? (flecks, streaks, tablespoons, etc.)     No  5. DIFFICULTY BREATHING: Are you having difficulty breathing? If Yes, ask: How bad is it? (e.g., mild, moderate, severe)    - MILD: No SOB at rest, mild SOB with walking, speaks normally in sentences, can lie down, no retractions, pulse < 100.    - MODERATE: SOB at rest, SOB with minimal exertion and prefers to sit, cannot lie down flat, speaks in phrases, mild retractions, audible wheezing, pulse 100-120.    - SEVERE: Very SOB at rest, speaks in single words, struggling to breathe, sitting hunched forward, retractions, pulse > 120      Mild shortness of breath, reports wheezing  6. FEVER:  Do you have a fever? If Yes, ask: What is your temperature, how was it measured, and when did it start?     No  7. CARDIAC HISTORY: Do you have any history of heart disease? (e.g., heart attack, congestive heart failure)      No  8. LUNG HISTORY: Do you have any history of lung disease?  (e.g., pulmonary embolus, asthma, emphysema)    Hx of bronchitis  9. PE RISK FACTORS: Do you have a history of blood clots? (or: recent major surgery, recent prolonged travel, bedridden)     No  10. OTHER SYMPTOMS: Do you have any other symptoms? (e.g., runny nose, wheezing, chest pain)       Wheezing, body aches  11. PREGNANCY: Is there any chance you are pregnant? When was your last menstrual period?       No  12. TRAVEL: Have you traveled out of the country in the last month? (e.g., travel history, exposures)       No  Protocols used: Cough - Acute Productive-A-AH

## 2023-08-27 NOTE — Telephone Encounter (Signed)
 Aware, will watch for correspondence Thanks for seeing her

## 2023-08-27 NOTE — Progress Notes (Signed)
 Rayann Jolley T. Jamileth Putzier, MD, CAQ Sports Medicine [facility] at [facility] [address] [place], [address]  Phone: [phone]  FAX: [phone]  Teresa Mcgee - 58 y.o. female  MRN [id number]  Date of Birth: 03-25-1966  Date: 08/27/2023  PCP: Randeen Laine LABOR, MD  Referral: Randeen Laine LABOR, MD  Chief Complaint  Patient presents with   Cough   Wheezing   Fatigue   Subjective:   Teresa Mcgee is a 58 y.o. very pleasant female patient with Body mass index is 40.41 kg/m. who presents with the following:  Has been sick since last wed.  She regularly smokes about a half a pack a day.  She has not smoked since she started to get sick last week.  She has a significant cough with audible wheezing to the plain ear sitting in the exam room.  She has had a cough has been productive of clear mucus. She does not have any significant sore throat, nausea, fever, vomiting, diarrhea She does have a right earache  She has not left the home much recently, and she does not think she has been exposed to any significant sick people.  Pulse ox is 89% on room air.  Regular - smoker, 1/2 PPD  Clear mucous No ST No earache - R ear  No n/v/d  Review of Systems is noted in the HPI, as appropriate  Objective:   BP 114/70 (BP Location: Left Arm, Patient Position: Sitting, Cuff Size: Large)   Pulse 78   Temp 98.1 F (36.7 C) (Temporal)   Ht 5' 3 (1.6 m)   Wt 228 lb 2 oz (103.5 kg)   LMP  (LMP Unknown)   SpO2 (!) 89%   BMI 40.41 kg/m    Gen: WDWN, cooperative. Globally Non-toxic HEENT: Normocephalic and atraumatic. Throat clear, w/o exudate, R TM clear, L TM - good landmarks, No fluid present. rhinnorhea. No frontal or maxillary sinus T. MMM NECK: Anterior cervical  LAD is present CV: RRR, No M/G/R, cap refill <2 sec PULM: Breathing comfortably in no respiratory distress.  She does have some mild bilateral wheezing without focal crackles or rhonchi ABD:  S,NT,ND,+BS. No HSM. No rebound. MSK: Nml gait   Laboratory and Imaging Data: Results for orders placed or performed in visit on 08/27/23  POC Influenza A&B (Binax test)   Collection Time: 08/27/23  2:28 PM  Result Value Ref Range   Influenza A, POC Positive (A) Negative   Influenza B, POC Negative Negative  POC COVID-19   Collection Time: 08/27/23  2:28 PM  Result Value Ref Range   SARS Coronavirus 2 Ag Negative Negative     Assessment and Plan:     ICD-10-CM   1. Influenza A with respiratory manifestations  J10.1     2. Cough, unspecified type  R05.9 albuterol  (PROVENTIL ) (2.5 MG/3ML) 0.083% nebulizer solution 2.5 mg    POC Influenza A&B (Binax test)    POC COVID-19     Acute influenza A.  She is beyond the time window when Tamiflu would be helpful.  I sent her in some oral steroids and additionally gave her an albuterol  inhaler.  We did give her an albuterol  nebulizer today in the office.  Long-term smoker, COPD cannot be excluded.  Medication Management during today's office visit: Meds ordered this encounter  Medications   albuterol  (PROVENTIL ) (2.5 MG/3ML) 0.083% nebulizer solution 2.5 mg   predniSONE  (DELTASONE ) 20 MG tablet    Sig: 2 tabs  po for 7 days, then 1 tab po for 7 days    Dispense:  21 tablet    Refill:  0   albuterol  (VENTOLIN  HFA) 108 (90 Base) MCG/ACT inhaler    Sig: Inhale 2 puffs into the lungs every 4 (four) hours as needed for wheezing or shortness of breath.    Dispense:  8 g    Refill:  0   Medications Discontinued During This Encounter  Medication Reason   albuterol  (VENTOLIN  HFA) 108 (90 Base) MCG/ACT inhaler     Orders placed today for conditions managed today: Orders Placed This Encounter  Procedures   POC Influenza A&B (Binax test)   POC COVID-19    Disposition: No follow-ups on file.  Dragon [employer] One speech-to-text software was used for transcription in this dictation.  Possible transcriptional errors can occur using  Animal nutritionist.   Signed,  Jacques DASEN. Korinna Tat, MD   Outpatient Encounter Medications as of 08/27/2023  Medication Sig   Accu-Chek FastClix Lancets MISC USE UP TO 4 TIMES DAILY AS DIRECTED   ACCU-CHEK GUIDE test strip USE UP TO 4 TIMES DAILY AS DIRECTED   albuterol  (VENTOLIN  HFA) 108 (90 Base) MCG/ACT inhaler Inhale 2 puffs into the lungs every 4 (four) hours as needed for wheezing or shortness of breath.   amLODipine  (NORVASC ) 10 MG tablet TAKE 1 TABLET BY MOUTH EVERY DAY   azelastine  (OPTIVAR ) 0.05 % ophthalmic solution Place 1 drop into both eyes 2 (two) times daily as needed.   blood glucose meter kit and supplies Dispense based on patient and insurance preference. Use up to four times daily as directed. (FOR ICD-10 E10.9, E11.9).   [employer] D3 125 MCG (5000 UT) capsule TAKE 1 CAPSULE BY MOUTH DAILY WITH BREAKFAST. TAKE ALONG WITH CALCIUM  AND MAGNESIUM .   Evolocumab  (REPATHA  SURECLICK) 140 MG/ML SOAJ Inject 140 mg into the skin every 14 (fourteen) days.   ezetimibe  (ZETIA ) 10 MG tablet TAKE 1 TABLET BY MOUTH EVERY DAY   fluticasone  (FLONASE ) 50 MCG/ACT nasal spray SPRAY 2 SPRAYS INTO EACH NOSTRIL EVERY DAY   gabapentin  (NEURONTIN ) 100 MG capsule TAKE 1 CAPSULE (100 MG TOTAL) BY MOUTH 2 (TWO) TIMES DAILY AND 3 CAPSULES (300 MG TOTAL) AT BEDTIME.   hydrochlorothiazide  (HYDRODIURIL ) 25 MG tablet TAKE 1 TABLET (25 MG TOTAL) BY MOUTH DAILY. AS DIRECTED   levocetirizine (XYZAL ) 5 MG tablet TAKE 1 TABLET BY MOUTH EVERY DAY IN THE EVENING   levothyroxine  (SYNTHROID ) 112 MCG tablet TAKE 1 TABLET BY MOUTH DAILY BEFORE BREAKFAST.   Magnesium  Oxide -Mg Supplement 500 MG CAPS TAKE 1 CAPSULE BY MOUTH TWICE A DAY (AT 8AM & 10PM)   meloxicam  (MOBIC ) 15 MG tablet Take one pill by mouth with food every other day as needed for pain   metFORMIN  (GLUCOPHAGE -XR) 500 MG 24 hr tablet Take 1 tablet (500 mg total) by mouth 2 (two) times daily with a meal.   naloxone  (NARCAN ) nasal spray 4 mg/0.1 mL Place 1 spray into  the nose as needed for up to 365 doses (for opioid-induced respiratory depresssion). In case of emergency (overdose), spray once into each nostril. If no response within 3 minutes, repeat application and call 911.   Olopatadine HCl 0.2 % SOLN INSTILL 1 DROP INTO BOTH EYES EVERY DAY AS NEEDED   [START ON 08/28/2023] oxyCODONE  (OXY IR/ROXICODONE ) 5 MG immediate release tablet Take 1 tablet (5 mg total) by mouth every 8 (eight) hours as needed for severe pain (pain score 7-10). Must last 30 days   [  START ON 09/27/2023] oxyCODONE  (OXY IR/ROXICODONE ) 5 MG immediate release tablet Take 1 tablet (5 mg total) by mouth every 8 (eight) hours as needed for severe pain (pain score 7-10). Must last 30 days   [START ON 10/27/2023] oxyCODONE  (OXY IR/ROXICODONE ) 5 MG immediate release tablet Take 1 tablet (5 mg total) by mouth every 8 (eight) hours as needed for severe pain (pain score 7-10). Must last 30 days   pantoprazole  (PROTONIX ) 40 MG tablet Take 1 tablet (40 mg total) by mouth daily.   potassium chloride  (KLOR-CON ) 10 MEQ tablet TAKE 1 TABLET BY MOUTH EVERY DAY   predniSONE  (DELTASONE ) 20 MG tablet 2 tabs po for 7 days, then 1 tab po for 7 days   REFRESH CELLUVISC 1 % GEL APPLY 1 DROP TO EYE 3 (THREE) TIMES DAILY.   Semaglutide ,0.25 or 0.5MG /DOS, (OZEMPIC , 0.25 OR 0.5 MG/DOSE,) 2 MG/3ML SOPN INJECT 0.5 MG INTO THE SKIN ONE TIME PER WEEK   tiZANidine  (ZANAFLEX ) 4 MG tablet Take 1 tablet (4 mg total) by mouth every 8 (eight) hours as needed for muscle spasms.   [DISCONTINUED] albuterol  (VENTOLIN  HFA) 108 (90 Base) MCG/ACT inhaler INHALE 2 PUFFS INTO THE LUNGS EVERY 4 HOURS AS NEEDED FOR (COUGH, SHORTNESS OF BREATH OR WHEEZING)   [EXPIRED] albuterol  (PROVENTIL ) (2.5 MG/3ML) 0.083% nebulizer solution 2.5 mg    No facility-administered encounter medications on file as of 08/27/2023.

## 2023-08-27 NOTE — Progress Notes (Signed)
 Nursing Pain Medication Assessment:  Safety precautions to be maintained throughout the outpatient stay will include: orient to surroundings, keep bed in low position, maintain call bell within reach at all times, provide assistance with transfer out of bed and ambulation.  Medication Inspection Compliance: Pill count conducted under aseptic conditions, in front of the patient. Neither the pills nor the bottle was removed from the patient's sight at any time. Once count was completed pills were immediately returned to the patient in their original bottle.  Medication: Oxycodone  IR Pill/Patch Count:  5 of 90 pills remain Pill/Patch Appearance: Markings consistent with prescribed medication Bottle Appearance: Standard pharmacy container. Clearly labeled. Filled Date: 68 / 23 / 2025 Last Medication intake:  Today

## 2023-08-30 ENCOUNTER — Other Ambulatory Visit: Payer: Self-pay | Admitting: Family Medicine

## 2023-08-31 ENCOUNTER — Other Ambulatory Visit: Payer: Self-pay | Admitting: Family Medicine

## 2023-08-31 NOTE — Progress Notes (Signed)
  Subjective:  Patient ID: Teresa Mcgee, female    DOB: [birth year],  MRN: [id number]  58 y.o. female presents with preventative diabetic foot care and painful elongated mycotic toenails 1-5 bilaterally which are tender when wearing enclosed shoe gear. Pain is relieved with periodic professional debridement. Chief Complaint  Patient presents with   Nail Problem    Pt is here for [facility], last A1C was 6.9 PCP is Dr Milinda Antis and LOV was 2 months ago.     PCP: Teresa Pimple, MD.  New problem(s): None.   Review of Systems: Negative except as noted in the HPI.   Allergies  Allergen Reactions   Statins     myopathy    Objective:  There were no vitals filed for this visit. Constitutional Patient is a pleasant 58 y.o. female morbidly obese in NAD. AAO x 3.  Vascular Capillary fill time to digits <3 seconds.  DP/PT pulse(s) are faintly palpable b/l lower extremities. Pedal hair absent b/l. Lower extremity skin temperature gradient warm to cool b/l. No pain with calf compression b/l. No cyanosis or clubbing noted. No ischemia nor gangrene noted b/l.   Neurologic Protective sensation intact 5/5 intact bilaterally with 10g monofilament b/l. Vibratory sensation intact b/l. No clonus b/l.   Dermatologic Pedal skin is thin, shiny and atrophic b/l.  No open wounds b/l lower extremities. No interdigital macerations b/l lower extremities. Toenails 1-5 b/l elongated, discolored, dystrophic, thickened, crumbly with subungual debris and tenderness to dorsal palpation. No hyperkeratotic nor porokeratotic lesions present on today's visit.  Orthopedic: Normal muscle strength 5/5 to all lower extremity muscle groups bilaterally. Pes planus deformity noted bilateral LE.   Last HgA1c:     Latest Ref Rng & Units 05/28/2023   11:37 AM 12/01/2022   11:50 AM  Hemoglobin A1C  Hemoglobin-A1c 4.0 - 5.6 % 6.9  6.5      Assessment:   1. Pain due to onychomycosis of toenails of both feet   2. Type 2 diabetes  mellitus without complication, without long-term current use of insulin (HCC)    Plan:  Patient was evaluated and treated. All patient's and/or POA's questions/concerns addressed on today's visit. Toenails 1-5 debrided in length and girth without incident. Continue soft, supportive shoe gear daily. Report any pedal injuries to medical professional. Call office if there are any questions/concerns. -Continue foot and shoe inspections daily. Monitor blood glucose per PCP/Endocrinologist's recommendations. -Patient/POA to call should there be question/concern in the interim.  Return in about 3 months (around 11/21/2023).  Freddie Breech, DPM      [place] LOCATION: [address] [place] [address]                   Office [phone]   [facility] LOCATION: [address] [place], [place] [address] Office [phone]

## 2023-09-04 ENCOUNTER — Other Ambulatory Visit: Payer: Self-pay | Admitting: Family Medicine

## 2023-09-04 NOTE — Telephone Encounter (Signed)
How is she tolerating this? Does she want to go up on the dose?  Thanks

## 2023-09-05 NOTE — Telephone Encounter (Signed)
Pt said she is tolerating the med with no side eff or issues and it okay increasing does

## 2023-09-07 ENCOUNTER — Ambulatory Visit (INDEPENDENT_AMBULATORY_CARE_PROVIDER_SITE_OTHER): Payer: Medicare HMO

## 2023-09-07 VITALS — BP 122/80 | Ht 63.0 in | Wt 227.4 lb

## 2023-09-07 DIAGNOSIS — Z0000 Encounter for general adult medical examination without abnormal findings: Secondary | ICD-10-CM | POA: Diagnosis not present

## 2023-09-07 DIAGNOSIS — Z1231 Encounter for screening mammogram for malignant neoplasm of breast: Secondary | ICD-10-CM | POA: Diagnosis not present

## 2023-09-07 NOTE — Patient Instructions (Addendum)
Teresa Mcgee , Thank you for taking time to come for your Medicare Wellness Visit. I appreciate your ongoing commitment to your health goals. Please review the following plan we discussed and let me know if I can assist you in the future.   Referrals/Orders/Follow-Ups/Clinician Recommendations:   You have an order for:  []   2D Mammogram  [x]   3D Mammogram  []   Bone Density     Please call for appointment:  [facility]  [address] [place] [address] [phone]  [facility] [address] [place], [place] [address] [phone]  [facility] [address] [place]anie [place], [place] [address] [phone]    Make sure to wear two-piece clothing.  No lotions, powders, or deodorants the day of the appointment. Make sure to bring picture ID and insurance card.  Bring list of medications you are currently taking including any supplements.   Schedule your [facility] screening mammogram through MyChart!   Log into your MyChart account.  Go to 'Visit' (or 'Appointments' if on mobile App) --> Schedule an Appointment  Under 'Select a Reason for Visit' choose the Mammogram Screening option.  Complete the pre-visit questions and select the time and place that best fits your schedule.    This is a list of the screening recommended for you and due dates:  Health Maintenance  Topic Date Due   Pneumococcal Vaccination (1 of 2 - PCV) Never done   Zoster (Shingles) Vaccine (1 of 2) Never done   Eye exam for diabetics  08/16/2023   Flu Shot  11/12/2023*   DTaP/Tdap/Td vaccine (2 - Td or Tdap) 05/27/2024*   COVID-19 Vaccine (4 - 2024-25 season) 06/12/2024*   Hemoglobin A1C  11/26/2023   Yearly kidney health urinalysis for diabetes  12/01/2023   Mammogram  12/01/2023   Yearly kidney function blood test for diabetes  12/29/2023   Complete foot exam    05/20/2024   Medicare Annual Wellness Visit  09/06/2024   Colon Cancer Screening  11/26/2024   Hepatitis C Screening  Completed   HIV Screening  Completed   HPV Vaccine  Aged Out   Cologuard (Stool DNA test)  Discontinued  *Topic was postponed. The date shown is not the original due date.    Advanced directives: (Declined) Advance directive discussed with you today. Even though you declined this today, please call our office should you change your mind, and we can give you the proper paperwork for you to fill out.  Next Medicare Annual Wellness Visit scheduled for next year: Yes 09/10/2024 @ 11:30am in person

## 2023-09-07 NOTE — Progress Notes (Signed)
Please attest and cosign this visit due to patients primary care provider not being in the office at the time the visit was completed.    Subjective:   Teresa Mcgee is a 58 y.o. female who presents for Medicare Annual (Subsequent) preventive examination.  Visit Complete: In person  Patient Medicare AWV questionnaire was completed by the patient on (not done); I have confirmed that all information answered by patient is correct and no changes since this date.  Cardiac Risk Factors include: advanced age (>28men, >58 women);diabetes mellitus;dyslipidemia;hypertension;obesity (BMI >30kg/m2);sedentary lifestyle    Objective:    Today's Vitals   09/07/23 1132 09/07/23 1134  BP: 122/80   Weight: 227 lb 6.4 oz (103.1 kg)   Height: 5\' 3"  (1.6 m)   PainSc:  5    Body mass index is 40.28 kg/m.     08/27/2023   11:50 AM 05/28/2023    2:07 PM 03/12/2023    8:11 PM 02/28/2023   11:39 AM 12/29/2022    8:44 AM 11/29/2022   11:48 AM 02/27/2022    1:25 PM  Advanced Directives  Does Patient Have a Medical Advance Directive? No No No No No No No  Would patient like information on creating a medical advance directive? No - Patient declined No - Patient declined  No - Patient declined No - Patient declined  No - Patient declined    Current Medications (verified) Outpatient Encounter Medications as of 09/07/2023  Medication Sig   Accu-Chek FastClix Lancets MISC USE UP TO 4 TIMES DAILY AS DIRECTED   ACCU-CHEK GUIDE test strip USE UP TO 4 TIMES DAILY AS DIRECTED   albuterol (VENTOLIN HFA) 108 (90 Base) MCG/ACT inhaler Inhale 2 puffs into the lungs every 4 (four) hours as needed for wheezing or shortness of breath.   amLODipine (NORVASC) 10 MG tablet TAKE 1 TABLET BY MOUTH EVERY DAY   azelastine (OPTIVAR) 0.05 % ophthalmic solution Place 1 drop into both eyes 2 (two) times daily as needed.   blood glucose meter kit and supplies Dispense based on patient and insurance preference. Use up to four  times daily as directed. (FOR ICD-10 E10.9, E11.9).   [employer] D3 125 MCG (5000 UT) capsule TAKE 1 CAPSULE BY MOUTH DAILY WITH BREAKFAST. TAKE ALONG WITH CALCIUM AND MAGNESIUM.   Evolocumab (REPATHA SURECLICK) 140 MG/ML SOAJ Inject 140 mg into the skin every 14 (fourteen) days.   ezetimibe (ZETIA) 10 MG tablet TAKE 1 TABLET BY MOUTH EVERY DAY   fluticasone (FLONASE) 50 MCG/ACT nasal spray SPRAY 2 SPRAYS INTO EACH NOSTRIL EVERY DAY   gabapentin (NEURONTIN) 100 MG capsule TAKE 1 CAPSULE (100 MG TOTAL) BY MOUTH 2 (TWO) TIMES DAILY AND 3 CAPSULES (300 MG TOTAL) AT BEDTIME.   hydrochlorothiazide (HYDRODIURIL) 25 MG tablet TAKE 1 TABLET (25 MG TOTAL) BY MOUTH DAILY. AS DIRECTED   levocetirizine (XYZAL) 5 MG tablet TAKE 1 TABLET BY MOUTH EVERY DAY IN THE EVENING   levothyroxine (SYNTHROID) 112 MCG tablet TAKE 1 TABLET BY MOUTH DAILY BEFORE BREAKFAST.   Magnesium Oxide -Mg Supplement 500 MG CAPS TAKE 1 CAPSULE BY MOUTH TWICE A DAY (AT 8AM & 10PM)   meloxicam (MOBIC) 15 MG tablet Take one pill by mouth with food every other day as needed for pain   metFORMIN (GLUCOPHAGE-XR) 500 MG 24 hr tablet TAKE 1 TABLET BY MOUTH 2 TIMES DAILY WITH A MEAL.   naloxone (NARCAN) nasal spray 4 mg/0.1 mL Place 1 spray into the nose as needed for up  to 365 doses (for opioid-induced respiratory depresssion). In case of emergency (overdose), spray once into each nostril. If no response within 3 minutes, repeat application and call 911.   Olopatadine HCl 0.2 % SOLN INSTILL 1 DROP INTO BOTH EYES EVERY DAY AS NEEDED   oxyCODONE (OXY IR/ROXICODONE) 5 MG immediate release tablet Take 1 tablet (5 mg total) by mouth every 8 (eight) hours as needed for severe pain (pain score 7-10). Must last 30 days   [START ON 09/27/2023] oxyCODONE (OXY IR/ROXICODONE) 5 MG immediate release tablet Take 1 tablet (5 mg total) by mouth every 8 (eight) hours as needed for severe pain (pain score 7-10). Must last 30 days   [START ON 10/27/2023] oxyCODONE (OXY  IR/ROXICODONE) 5 MG immediate release tablet Take 1 tablet (5 mg total) by mouth every 8 (eight) hours as needed for severe pain (pain score 7-10). Must last 30 days   pantoprazole (PROTONIX) 40 MG tablet Take 1 tablet (40 mg total) by mouth daily.   potassium chloride (KLOR-CON) 10 MEQ tablet TAKE 1 TABLET BY MOUTH EVERY DAY   predniSONE (DELTASONE) 20 MG tablet 2 tabs po for 7 days, then 1 tab po for 7 days   REFRESH CELLUVISC 1 % GEL APPLY 1 DROP TO EYE 3 (THREE) TIMES DAILY.   Semaglutide, 1 MG/DOSE, 4 MG/3ML SOPN Inject 1 mg as directed once a week.   tiZANidine (ZANAFLEX) 4 MG tablet Take 1 tablet (4 mg total) by mouth every 8 (eight) hours as needed for muscle spasms.   No facility-administered encounter medications on file as of 09/07/2023.    Allergies (verified) Statins   History: Past Medical History:  Diagnosis Date   Arthritis    Bell's palsy may 2012   Chest pain    Diabetes mellitus without complication (HCC)    GERD (gastroesophageal reflux disease)    H/O hiatal hernia    Hyperlipidemia    Hypertension    Hypothyroidism    Shingles    Shortness of breath    Sleep apnea    uses cpap   Past Surgical History:  Procedure Laterality Date   ABDOMINAL HYSTERECTOMY     CESAREAN SECTION     cesarian     3x   COLONOSCOPY WITH PROPOFOL N/A 11/27/2019   Procedure: COLONOSCOPY WITH PROPOFOL;  Surgeon: Wyline Mood, MD;  Location[facility] ENDOSCOPY;  Service: Gastroenterology;  Laterality: N/A;   ESOPHAGOGASTRODUODENOSCOPY (EGD) WITH PROPOFOL N/A 07/20/2020   Procedure: ESOPHAGOGASTRODUODENOSCOPY (EGD) WITH PROPOFOL;  Surgeon: Wyline Mood, MD;  Location: [facility] ENDOSCOPY;  Service: Gastroenterology;  Laterality: N/A;   PARTIAL HYSTERECTOMY  2000   abnormal uterine bleeding   THYROIDECTOMY  1998   TOOTH EXTRACTION     TUBAL LIGATION     Family History  Problem Relation Age of Onset   Heart attack Father 22       deceased   Diabetes Brother    Stroke Brother 25        twice   Stroke Sister 45   Diabetes Sister    Heart attack Sister 80   Heart attack Brother 53   Breast cancer Maternal Aunt    Social History   Socioeconomic History   Marital status: Married    Spouse name: Not on file   Number of children: Not on file   Years of education: Not on file   Highest education level: Not on file  Occupational History   Occupation: disabled    Employer: [employer]    Comment:  back  Tobacco Use   Smoking status: Every Day    Current packs/day: 0.50    Average packs/day: 0.5 packs/day for 20.0 years (10.0 ttl pk-yrs)    Types: Cigarettes   Smokeless tobacco: Never   Tobacco comments:    2-3 cigs daily-09/22/2020  Vaping Use   Vaping status: Never Used  Substance and Sexual Activity   Alcohol use: No   Drug use: No   Sexual activity: Not Currently  Other Topics Concern   Not on file  Social History Narrative   Married but she and husband separated, she helps take care of him (he is in poor health)   She lives with her grown son who is mentally disabled   She has been on disability since 2011   Social Drivers of Health   Financial Resource Strain: Low Risk  (09/07/2023)   Overall Financial Resource Strain (CARDIA)    Difficulty of Paying Living Expenses: Not hard at all  Recent Concern: Financial Resource Strain - Medium Risk (08/02/2023)   Received from [facility]   Overall Financial Resource Strain (CARDIA)    Difficulty of Paying Living Expenses: Somewhat hard  Food Insecurity: No Food Insecurity (09/07/2023)   Hunger Vital Sign    Worried About Running Out of Food in the Last Year: Never true    Ran Out of Food in the Last Year: Never true  Recent Concern: Food Insecurity - Food Insecurity Present (08/02/2023)   Received from [facility]   Hunger Vital Sign    Worried About Running Out of Food in the Last Year: Sometimes true    Ran Out of Food in the Last Year: Sometimes true  Transportation  Needs: No Transportation Needs (09/07/2023)   PRAPARE - Administrator, Civil Service (Medical): No    Lack of Transportation (Non-Medical): No  Physical Activity: Inactive (09/07/2023)   Exercise Vital Sign    Days of Exercise per Week: 0 days    Minutes of Exercise per Session: 0 min  Stress: No Stress Concern Present (09/07/2023)   [employer] of Occupational Health - Occupational Stress Questionnaire    Feeling of Stress : Only a little  Social Connections: Moderately Integrated (09/07/2023)   Social Connection and Isolation Panel [NHANES]    Frequency of Communication with Friends and Family: More than three times a week    Frequency of Social Gatherings with Friends and Family: Twice a week    Attends Religious Services: More than 4 times per year    Active Member of [employer] or Organizations: No    Attends Engineer, structural: Never    Marital Status: Married    Tobacco Counseling Ready to quit: Not Answered Counseling given: Not Answered Tobacco comments: 2-3 cigs daily-09/22/2020   Clinical Intake:  Pre-visit preparation completed: Yes  Pain : 0-10 Pain Score: 5  Pain Type: Chronic pain Pain Location: Generalized Pain Descriptors / Indicators: Aching Pain Onset: More than a month ago Pain Frequency: Constant Pain Relieving Factors: rest, meds Effect of Pain on Daily Activities: limits activities  Pain Relieving Factors: rest, meds  BMI - recorded: 40.03 Nutritional Status: BMI > 30  Obese Nutritional Risks: None Diabetes: Yes CBG done?: Yes CBG resulted in Enter/ Edit results?: No Did pt. bring in CBG monitor from home?: No  How often do you need to have someone help you when you read instructions, pamphlets, or other written materials from your doctor or pharmacy?: 1 -  Never  Interpreter Needed?: No  Comments: lives with husband Information entered by :: B.Mattia Liford,LPN   Activities of Daily Living    09/07/2023   11:50 AM  12/29/2022    8:42 AM  In your present state of health, do you have any difficulty performing the following activities:  Hearing? 0 0  Vision? 0 0  Difficulty concentrating or making decisions? 0 0  Walking or climbing stairs? 0 0  Dressing or bathing? 0 0  Doing errands, shopping? 0   Preparing Food and eating ? N   Using the Toilet? N   In the past six months, have you accidently leaked urine? N   Do you have problems with loss of bowel control? N   Managing your Medications? N   Managing your Finances? N   Housekeeping or managing your Housekeeping? N     Patient Care Team: Tower, Audrie Gallus, MD as PCP - General (Family Medicine) [place], [facility]  Indicate any recent Medical Services you may have received from other than [facility] providers in the past year (date may be approximate).     Assessment:   This is a routine wellness examination for Maralyn.  Hearing/Vision screen Hearing Screening - Comments:: Pt says her hearing is good Vision Screening - Comments:: Pt says her vision is good with glasses [facility]   Goals Addressed             This Visit's Progress    COMPLETED: Patient Stated   On track    11/28/2019, I will maintain and continue medications as prescribed.      Weight (lb) < 200 lb (90.7 kg)   227 lb 6.4 oz (103.1 kg)    I would like to get under 200. I eat less, no late night eating.       Depression Screen    09/07/2023   11:45 AM 08/27/2023   11:49 AM 06/04/2023    2:01 PM 05/28/2023   11:33 AM 12/01/2022   11:15 AM 11/29/2022   11:48 AM 02/27/2022    1:25 PM  PHQ 2/9 Scores  PHQ - 2 Score 0 0 0 0 0 0 0  PHQ- 9 Score   2 1 2       Fall Risk    09/07/2023   11:40 AM 08/27/2023   11:49 AM 06/04/2023    2:01 PM 05/28/2023    2:07 PM 02/28/2023   11:39 AM  Fall Risk   Falls in the past year? 0 0 1 0 0  Number falls in past yr: 0  0    Injury with Fall? 0  0    Risk for fall due to : No Fall Risks  History of fall(s)    Follow up  Education provided;Falls prevention discussed  Falls evaluation completed      MEDICARE RISK AT HOME: Medicare Risk at Home Any stairs in or around the home?: No If so, are there any without handrails?: No Home free of loose throw rugs in walkways, pet beds, electrical cords, etc?: Yes Adequate lighting in your home to reduce risk of falls?: Yes Life alert?: No Use of a cane, walker or w/c?: No Grab bars in the bathroom?: Yes Shower chair or bench in shower?: Yes Elevated toilet seat or a handicapped toilet?: Yes  TIMED UP AND GO:  Was the test performed?  Yes  Length of time to ambulate 10 feet: 10 sec Gait steady and fast without use of assistive device  Cognitive Function:    12/08/2020   11:36 AM 11/28/2019    2:53 PM  MMSE - Mini Mental State Exam  Not completed: Refused   Orientation to time  5  Orientation to Place  5  Registration  3  Attention/ Calculation  5  Recall  3  Language- repeat  1        09/07/2023   11:51 AM 12/12/2021   11:41 AM  6CIT Screen  What Year? 0 points 0 points  What month? 0 points 0 points  What time? 0 points 0 points  Count back from 20 0 points 0 points  Months in reverse 0 points 0 points  Repeat phrase 0 points 0 points  Total Score 0 points 0 points    Immunizations Immunization History  Administered Date(s) Administered   Hepb-cpg 07/24/2022, 08/24/2022   Influenza Split 05/23/2011, 04/24/2012   Influenza,inj,Quad PF,6+ Mos 06/11/2019   PFIZER(Purple Top)SARS-COV-2 Vaccination 11/12/2019, 12/03/2019, 08/09/2020   Tdap 04/25/2011    TDAP status: Up to date  Flu Vaccine status: Due, Education has been provided regarding the importance of this vaccine. Advised may receive this vaccine at local pharmacy or Health Dept. Aware to provide a copy of the vaccination record if obtained from local pharmacy or Health Dept. Verbalized acceptance and understanding.  Covid-19 vaccine status: Completed vaccines  Qualifies for  Shingles Vaccine? Yes   Zostavax completed Yes   Shingrix Completed?: Yes  Screening Tests Health Maintenance  Topic Date Due   Pneumococcal Vaccine 73-94 Years old (1 of 2 - PCV) Never done   Zoster Vaccines- Shingrix (1 of 2) Never done   OPHTHALMOLOGY EXAM  08/16/2023   INFLUENZA VACCINE  11/12/2023 (Originally 03/15/2023)   DTaP/Tdap/Td (2 - Td or Tdap) 05/27/2024 (Originally 04/24/2021)   COVID-19 Vaccine (4 - 2024-25 season) 06/12/2024 (Originally 04/15/2023)   HEMOGLOBIN A1C  11/26/2023   Diabetic kidney evaluation - Urine ACR  12/01/2023   MAMMOGRAM  12/01/2023   Diabetic kidney evaluation - eGFR measurement  12/29/2023   FOOT EXAM  05/20/2024   Medicare Annual Wellness (AWV)  09/06/2024   Colonoscopy  11/26/2024   Hepatitis C Screening  Completed   HIV Screening  Completed   HPV VACCINES  Aged Out   Fecal DNA (Cologuard)  Discontinued    Health Maintenance  Health Maintenance Due  Topic Date Due   Pneumococcal Vaccine 60-28 Years old (1 of 2 - PCV) Never done   Zoster Vaccines- Shingrix (1 of 2) Never done   OPHTHALMOLOGY EXAM  08/16/2023    Colorectal cancer screening: Type of screening: Colonoscopy. Completed 11/27/2019. Repeat every 5 years  Mammogram status: Ordered yes. Pt provided with contact info and advised to call to schedule appt.   Lung Cancer Screening: (Low Dose CT Chest recommended if Age 9-80 years, 20 pack-year currently smoking OR have quit w/in 15years.) does not qualify.   Lung Cancer Screening Referral: no  Additional Screening:  Hepatitis C Screening: does not qualify; Completed 03/13/2022  Vision Screening: Recommended annual ophthalmology exams for early detection of glaucoma and other disorders of the eye. Is the patient up to date with their annual eye exam?  Yes  Who is the provider or what is the name of the office in which the patient attends annual eye exams? [facility] If pt is not established with a provider, would they like to be  referred to a provider to establish care? No .   Dental Screening: Recommended annual dental exams for  proper oral hygiene  Diabetic Foot Exam: Diabetic Foot Exam: Completed 05/21/23  Community Resource Referral / Chronic Care Management: CRR required this visit?  No   CCM required this visit?  No    Plan:     I have personally reviewed and noted the following in the patient's chart:   Medical and social history Use of alcohol, tobacco or illicit drugs  Current medications and supplements including opioid prescriptions. Patient is currently taking opioid prescriptions. Information provided to patient regarding non-opioid alternatives. Patient advised to discuss non-opioid treatment plan with their provider. Functional ability and status Nutritional status Physical activity Advanced directives List of other physicians Hospitalizations, surgeries, and ER visits in previous 12 months Vitals Screenings to include cognitive, depression, and falls Referrals and appointments  In addition, I have reviewed and discussed with patient certain preventive protocols, quality metrics, and best practice recommendations. A written personalized care plan for preventive services as well as general preventive health recommendations were provided to patient.    Sue Lush, LPN   04/11/5620   After Visit Summary: (MyChart) Due to this being a telephonic visit, the after visit summary with patients personalized plan was offered to patient via MyChart   Nurse Notes: The patient states she is doing better, recovering from the flu. She presents wearing mask and noted to still have lingering cough. She has no concerns or questions at this time.  *No vaccines given at this time as pt still recovering from flu.

## 2023-09-10 ENCOUNTER — Encounter: Payer: Self-pay | Admitting: Family Medicine

## 2023-09-10 ENCOUNTER — Ambulatory Visit (INDEPENDENT_AMBULATORY_CARE_PROVIDER_SITE_OTHER): Payer: Medicare HMO | Admitting: Family Medicine

## 2023-09-10 VITALS — BP 122/70 | HR 78 | Temp 98.0°F | Ht 63.0 in | Wt 227.0 lb

## 2023-09-10 DIAGNOSIS — I10 Essential (primary) hypertension: Secondary | ICD-10-CM | POA: Diagnosis not present

## 2023-09-10 DIAGNOSIS — E039 Hypothyroidism, unspecified: Secondary | ICD-10-CM

## 2023-09-10 DIAGNOSIS — E1169 Type 2 diabetes mellitus with other specified complication: Secondary | ICD-10-CM | POA: Diagnosis not present

## 2023-09-10 DIAGNOSIS — Z720 Tobacco use: Secondary | ICD-10-CM

## 2023-09-10 DIAGNOSIS — E119 Type 2 diabetes mellitus without complications: Secondary | ICD-10-CM

## 2023-09-10 DIAGNOSIS — K219 Gastro-esophageal reflux disease without esophagitis: Secondary | ICD-10-CM

## 2023-09-10 DIAGNOSIS — Z7985 Long-term (current) use of injectable non-insulin antidiabetic drugs: Secondary | ICD-10-CM

## 2023-09-10 DIAGNOSIS — E785 Hyperlipidemia, unspecified: Secondary | ICD-10-CM | POA: Diagnosis not present

## 2023-09-10 LAB — LIPID PANEL
Cholesterol: 175 mg/dL (ref 0–200)
HDL: 74.4 mg/dL (ref >39.00)
LDL Cholesterol: 61 mg/dL (ref 0–99)
NonHDL: 100.14
Total CHOL/HDL Ratio: 2
Triglycerides: 196 mg/dL — ABNORMAL HIGH (ref 0.0–149.0)
VLDL: 39.2 mg/dL (ref 0.0–40.0)

## 2023-09-10 LAB — HEPATIC FUNCTION PANEL
ALT: 36 U/L — ABNORMAL HIGH (ref 0–35)
AST: 36 U/L (ref 0–37)
Albumin: 4.5 g/dL (ref 3.5–5.2)
Alkaline Phosphatase: 70 U/L (ref 39–117)
Bilirubin, Direct: 0.1 mg/dL (ref 0.0–0.3)
Total Bilirubin: 0.6 mg/dL (ref 0.2–1.2)
Total Protein: 7.5 g/dL (ref 6.0–8.3)

## 2023-09-10 LAB — BASIC METABOLIC PANEL
BUN: 9 mg/dL (ref 6–23)
CO2: 33 meq/L — ABNORMAL HIGH (ref 19–32)
Calcium: 10.1 mg/dL (ref 8.4–10.5)
Chloride: 98 meq/L (ref 96–112)
Creatinine, Ser: 0.93 mg/dL (ref 0.40–1.20)
GFR: 68.42 mL/min (ref >60.00)
Glucose, Bld: 91 mg/dL (ref 70–99)
Potassium: 3.3 meq/L — ABNORMAL LOW (ref 3.5–5.1)
Sodium: 142 meq/L (ref 135–145)

## 2023-09-10 LAB — POCT GLYCOSYLATED HEMOGLOBIN (HGB A1C): Hemoglobin A1C: 6.4 % — AB (ref 4.0–5.6)

## 2023-09-10 LAB — TSH: TSH: 6.11 u[IU]/mL — ABNORMAL HIGH (ref 0.35–5.50)

## 2023-09-10 NOTE — Assessment & Plan Note (Signed)
Disc in detail risks of smoking and possible outcomes including copd, vascular/ heart disease, cancer , respiratory and sinus infections as well as osteoporosis  Pt voices understanding  Pt was able to avoid smoking for 2 weeks when she had the flu  Now back to less than 1/2 ppd Not motivated to quit yet   When further recovered will discuss immunizations

## 2023-09-10 NOTE — Assessment & Plan Note (Signed)
bp in fair control at this time  BP Readings from Last 1 Encounters:  09/10/23 122/70   No changes needed Most recent labs reviewed  Disc lifstyle change with low sodium diet and exercise  Plan to continue  amlodiine 10 mg daily  hctz 25 mg daily   Chem labs today

## 2023-09-10 NOTE — Progress Notes (Signed)
Subjective:    Patient ID: Teresa Mcgee, female    DOB: 1966/06/21, 58 y.o.   MRN: [id number]  HPI  Wt Readings from Last 3 Encounters:  09/10/23 227 lb (103 kg)  09/07/23 227 lb 6.4 oz (103.1 kg)  08/27/23 228 lb 2 oz (103.5 kg)   40.21 kg/m  Vitals:   09/10/23 1112  BP: 122/70  Pulse: 78  Temp: 98 F (36.7 C)  SpO2: 100%   Pt presents for follow up of DM2 and HTN and chronic medical problems  Recently had the flu    HTN bp is stable today  No cp or palpitations or headaches or edema  No side effects to medicines  BP Readings from Last 3 Encounters:  09/10/23 122/70  09/07/23 122/80  08/27/23 114/70    Amlodipine 10 mg daily  Hydrochlorothiazide 25 mg daily   Lab Results  Component Value Date   NA 140 12/29/2022   K 3.4 (L) 12/29/2022   CO2 29 12/29/2022   GLUCOSE 128 (H) 12/29/2022   BUN 12 12/29/2022   CREATININE 0.89 12/29/2022   CALCIUM 9.7 12/29/2022   GFR 70.61 12/01/2022   GFRNONAA >60 12/29/2022     DM2 Lab Results  Component Value Date   HGBA1C 6.4 (A) 09/10/2023   HGBA1C 6.9 (A) 05/28/2023   HGBA1C 6.5 12/01/2022   Improved  Semaglutide 1 mg weekly - no problems  Has decreased appetite Is eating less More motivated to eat better   Metformin XR  500 mg bid   Did have to take a course of prednisone for influenza on 1/13 by Dr Patsy Lager   Eye exam -had it 6 months ago   Hyperlipidemia Lab Results  Component Value Date   CHOL 158 12/01/2022   HDL 33.00 (L) 12/01/2022   LDLCALC 81 08/01/2022   LDLDIRECT 103.0 12/01/2022   TRIG 263.0 (H) 12/01/2022   CHOLHDL 5 12/01/2022   Zetia 10 mg daily  Repatha  Sees cardiology Lab Results  Component Value Date   ALT 112 (H) 12/29/2022   AST 169 (H) 12/29/2022   ALKPHOS 77 12/29/2022   BILITOT 0.7 12/29/2022   In aug at [facility] AST 121,ALT 106   Sees liver doctor in [place]  Watches this  Suspect fatty liver Is losing weight     Wants to check thyroid  Hypothyroidism   Pt has no clinical changes (tired from flu)  No change in energy level/ hair or skin/ edema and no tremor Lab Results  Component Value Date   TSH 0.78 12/01/2022   Takes levothyroxine 112 mcg daily   Smoking status  2 weeks without any cigarettes when sick  Now is smoking- less than 1/2 ppd      Patient Active Problem List   Diagnosis Date Noted   Elevated antinuclear antibody (ANA) level 05/28/2023   Morbid obesity (HCC) 05/28/2023   Elevated CK 05/28/2023   Right shoulder pain 03/15/2023   Hypomagnesemia 02/21/2022   Hypokalemia 02/21/2022   Fatty liver 02/06/2022   Allergic conjunctivitis 12/01/2021   Elevated LFTs 11/22/2021   Medication monitoring encounter 11/22/2021   Fungal dermatitis 11/22/2021   Routine general medical examination at a health care facility 08/19/2021   Encounter for screening mammogram for breast cancer 08/19/2021   Chronic use of opiate for therapeutic purpose 11/04/2020   Uncomplicated opioid dependence (HCC) 11/04/2020   Ragged cuticle 09/23/2020   Diabetes mellitus treated with injections of non-insulin medication (HCC) 03/30/2019   Pain due  to onychomycosis of toenails of both feet 03/03/2019   Cervicalgia 04/22/2018   Chronic musculoskeletal pain 04/22/2018   DDD (degenerative disc disease), thoracic 04/22/2018   DDD (degenerative disc disease), cervical 04/22/2018   Lumbar facet arthropathy (Bilateral) 04/22/2018   Lumbar facet syndrome (Bilateral) 04/22/2018   Chronic low back pain (1ry area of Pain) (Bilateral) (L>R) w/o sciatica 04/22/2018   Spondylosis without myelopathy or radiculopathy, cervical region 04/22/2018   Spondylosis without myelopathy or radiculopathy, lumbosacral region 04/22/2018   DDD (degenerative disc disease), lumbosacral 04/22/2018   Strain of lumbar paraspinal muscle, sequela 04/22/2018   Chronic hip pain (Bilateral) (R>L) 04/22/2018   Chronic sacroiliac joint pain (Left) 04/22/2018   Neurogenic pain  04/22/2018   Osteoarthritis involving multiple joints 04/22/2018   Chronic knee pain (Bilateral) (R>L) 04/22/2018   Chronic upper extremity pain (4th area of Pain) (Bilateral) (L>R) 02/27/2018   Vitamin D deficiency 02/27/2018   Chronic low back pain (Bilateral) (L>R) w/ sciatica (Bilateral) 01/24/2018   Chronic lower extremity pain (2ry area of Pain) (Bilateral) (L>R) 01/24/2018   Chronic neck pain (3ry area of Pain) (Bilateral) (L>R) 01/24/2018   Chronic pain syndrome 01/24/2018   Long term current use of opiate analgesic 01/24/2018   Disorder of skeletal system 01/24/2018   Nonallopathic lesion of head region 04/25/2012   Constipation 01/11/2012   Chest pain 01/11/2012   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   Numbness and tingling in both hands 08/25/2011   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   Muscle spasms of neck 05/23/2011   Nasal mucositis (ulcerative) 05/23/2011   Fatigue 03/21/2011   Tobacco abuse 05/24/2010   Hypothyroidism 01/25/2010   HYPERTENSION, BENIGN ESSENTIAL 01/25/2010   Hyperlipidemia associated with type 2 diabetes mellitus (HCC) 12/31/2009   DEPRESSION 12/31/2009   GERD 12/31/2009   OSA (obstructive sleep apnea) 12/31/2009   Past Medical History:  Diagnosis Date   Arthritis    Bell's palsy may 2012   Chest pain    Diabetes mellitus without complication (HCC)    GERD (gastroesophageal reflux disease)    H/O hiatal hernia    Hyperlipidemia    Hypertension    Hypothyroidism    Shingles    Shortness of breath    Sleep apnea    uses cpap   Past Surgical History:  Procedure Laterality Date   ABDOMINAL HYSTERECTOMY     CESAREAN SECTION     cesarian     3x   COLONOSCOPY WITH PROPOFOL N/A 11/27/2019   Procedure: COLONOSCOPY WITH PROPOFOL;  Surgeon: Wyline Mood, MD;  Location: [facility] ENDOSCOPY;  Service: Gastroenterology;  Laterality: N/A;   ESOPHAGOGASTRODUODENOSCOPY (EGD) WITH PROPOFOL N/A 07/20/2020   Procedure: ESOPHAGOGASTRODUODENOSCOPY (EGD) WITH PROPOFOL;   Surgeon: Wyline Mood, MD;  Location: [facility] ENDOSCOPY;  Service: Gastroenterology;  Laterality: N/A;   PARTIAL HYSTERECTOMY  2000   abnormal uterine bleeding   THYROIDECTOMY  1998   TOOTH EXTRACTION     TUBAL LIGATION     Social History   Tobacco Use   Smoking status: Every Day    Current packs/day: 0.50    Average packs/day: 0.5 packs/day for 20.0 years (10.0 ttl pk-yrs)    Types: Cigarettes   Smokeless tobacco: Never   Tobacco comments:    2-3 cigs daily-09/22/2020  Vaping Use   Vaping status: Never Used  Substance Use Topics   Alcohol use: No   Drug use: No   Family History  Problem Relation Age of Onset   Heart attack Father 21  deceased   Diabetes Brother    Stroke Brother 25       twice   Stroke Sister 42   Diabetes Sister    Heart attack Sister 41   Heart attack Brother 39   Breast cancer Maternal Aunt    Allergies  Allergen Reactions   Statins     myopathy   Current Outpatient Medications on File Prior to Visit  Medication Sig Dispense Refill   Accu-Chek FastClix Lancets MISC USE UP TO 4 TIMES DAILY AS DIRECTED 102 each 1   ACCU-CHEK GUIDE test strip USE UP TO 4 TIMES DAILY AS DIRECTED 100 strip 5   albuterol (VENTOLIN HFA) 108 (90 Base) MCG/ACT inhaler Inhale 2 puffs into the lungs every 4 (four) hours as needed for wheezing or shortness of breath. 8 g 0   amLODipine (NORVASC) 10 MG tablet TAKE 1 TABLET BY MOUTH EVERY DAY 90 tablet 2   azelastine (OPTIVAR) 0.05 % ophthalmic solution Place 1 drop into both eyes 2 (two) times daily as needed. 6 mL 3   blood glucose meter kit and supplies Dispense based on patient and insurance preference. Use up to four times daily as directed. (FOR ICD-10 E10.9, E11.9). 1 each 0   [employer] D3 125 MCG (5000 UT) capsule TAKE 1 CAPSULE BY MOUTH DAILY WITH BREAKFAST. TAKE ALONG WITH CALCIUM AND MAGNESIUM.  5   ELDERBERRY PO Take by mouth.     Evolocumab (REPATHA SURECLICK) 140 MG/ML SOAJ Inject 140 mg into the skin every 14  (fourteen) days. 6 mL 3   ezetimibe (ZETIA) 10 MG tablet TAKE 1 TABLET BY MOUTH EVERY DAY 90 tablet 2   fluticasone (FLONASE) 50 MCG/ACT nasal spray SPRAY 2 SPRAYS INTO EACH NOSTRIL EVERY DAY 48 mL 1   gabapentin (NEURONTIN) 100 MG capsule TAKE 1 CAPSULE (100 MG TOTAL) BY MOUTH 2 (TWO) TIMES DAILY AND 3 CAPSULES (300 MG TOTAL) AT BEDTIME. 150 capsule 5   hydrochlorothiazide (HYDRODIURIL) 25 MG tablet TAKE 1 TABLET (25 MG TOTAL) BY MOUTH DAILY. AS DIRECTED 90 tablet 1   levocetirizine (XYZAL) 5 MG tablet TAKE 1 TABLET BY MOUTH EVERY DAY IN THE EVENING 90 tablet 1   levothyroxine (SYNTHROID) 112 MCG tablet TAKE 1 TABLET BY MOUTH DAILY BEFORE BREAKFAST. 90 tablet 0   Magnesium Oxide -Mg Supplement 500 MG CAPS TAKE 1 CAPSULE BY MOUTH TWICE A DAY (AT 8AM & 10PM) 180 capsule 0   meloxicam (MOBIC) 15 MG tablet Take one pill by mouth with food every other day as needed for pain 45 tablet 1   metFORMIN (GLUCOPHAGE-XR) 500 MG 24 hr tablet TAKE 1 TABLET BY MOUTH 2 TIMES DAILY WITH A MEAL. 180 tablet 0   naloxone (NARCAN) nasal spray 4 mg/0.1 mL Place 1 spray into the nose as needed for up to 365 doses (for opioid-induced respiratory depresssion). In case of emergency (overdose), spray once into each nostril. If no response within 3 minutes, repeat application and call 911. 1 each 0   Olopatadine HCl 0.2 % SOLN INSTILL 1 DROP INTO BOTH EYES EVERY DAY AS NEEDED     oxyCODONE (OXY IR/ROXICODONE) 5 MG immediate release tablet Take 1 tablet (5 mg total) by mouth every 8 (eight) hours as needed for severe pain (pain score 7-10). Must last 30 days 90 tablet 0   [START ON 09/27/2023] oxyCODONE (OXY IR/ROXICODONE) 5 MG immediate release tablet Take 1 tablet (5 mg total) by mouth every 8 (eight) hours as needed for severe pain (pain  score 7-10). Must last 30 days 90 tablet 0   [START ON 10/27/2023] oxyCODONE (OXY IR/ROXICODONE) 5 MG immediate release tablet Take 1 tablet (5 mg total) by mouth every 8 (eight) hours as needed  for severe pain (pain score 7-10). Must last 30 days 90 tablet 0   pantoprazole (PROTONIX) 40 MG tablet Take 1 tablet (40 mg total) by mouth daily. 90 tablet 1   potassium chloride (KLOR-CON) 10 MEQ tablet TAKE 1 TABLET BY MOUTH EVERY DAY 90 tablet 1   REFRESH CELLUVISC 1 % GEL APPLY 1 DROP TO EYE 3 (THREE) TIMES DAILY.  12   Semaglutide, 1 MG/DOSE, 4 MG/3ML SOPN Inject 1 mg as directed once a week. 3 mL 0   tiZANidine (ZANAFLEX) 4 MG tablet Take 1 tablet (4 mg total) by mouth every 8 (eight) hours as needed for muscle spasms. 270 tablet 1   No current facility-administered medications on file prior to visit.    Review of Systems  Constitutional:  Positive for fatigue. Negative for activity change, appetite change, fever and unexpected weight change.  HENT:  Negative for congestion, ear pain, rhinorrhea, sinus pressure and sore throat.   Eyes:  Negative for pain, redness and visual disturbance.  Respiratory:  Positive for cough. Negative for shortness of breath and wheezing.        Cough is improving   Cardiovascular:  Negative for chest pain and palpitations.  Gastrointestinal:  Negative for abdominal pain, blood in stool, constipation and diarrhea.  Endocrine: Negative for polydipsia and polyuria.  Genitourinary:  Negative for dysuria, frequency and urgency.  Musculoskeletal:  Negative for arthralgias, back pain and myalgias.  Skin:  Negative for pallor and rash.  Allergic/Immunologic: Negative for environmental allergies.  Neurological:  Negative for dizziness, syncope and headaches.  Hematological:  Negative for adenopathy. Does not bruise/bleed easily.  Psychiatric/Behavioral:  Negative for decreased concentration and dysphoric mood. The patient is not nervous/anxious.        Objective:   Physical Exam Constitutional:      General: She is not in acute distress.    Appearance: Normal appearance. She is well-developed. She is obese. She is not ill-appearing or diaphoretic.  HENT:      Head: Normocephalic and atraumatic.  Eyes:     Conjunctiva/sclera: Conjunctivae normal.     Pupils: Pupils are equal, round, and reactive to light.  Neck:     Thyroid: No thyromegaly.     Vascular: No carotid bruit or JVD.  Cardiovascular:     Rate and Rhythm: Normal rate and regular rhythm.     Heart sounds: Normal heart sounds.     No gallop.  Pulmonary:     Effort: Pulmonary effort is normal. No respiratory distress.     Breath sounds: Normal breath sounds. No stridor. No wheezing, rhonchi or rales.     Comments: Diffusely distant bs  Abdominal:     General: There is no distension or abdominal bruit.     Palpations: Abdomen is soft.  Musculoskeletal:     Cervical back: Normal range of motion and neck supple.     Right lower leg: No edema.     Left lower leg: No edema.  Lymphadenopathy:     Cervical: No cervical adenopathy.  Skin:    General: Skin is warm and dry.     Coloration: Skin is not pale.     Findings: No rash.  Neurological:     Mental Status: She is alert.     Coordination:  Coordination normal.     Deep Tendon Reflexes: Reflexes are normal and symmetric. Reflexes normal.  Psychiatric:        Mood and Affect: Mood normal.           Assessment & Plan:   Problem List Items Addressed This Visit       Cardiovascular and Mediastinum   HYPERTENSION, BENIGN ESSENTIAL   bp in fair control at this time  BP Readings from Last 1 Encounters:  09/10/23 122/70   No changes needed Most recent labs reviewed  Disc lifstyle change with low sodium diet and exercise  Plan to continue  amlodiine 10 mg daily  hctz 25 mg daily   Chem labs today      Relevant Orders   Basic metabolic panel   Lipid panel   Hepatic function panel   TSH     Digestive   GERD   Omeprazole may be adding to B12 def        Endocrine   Hypothyroidism   TSH ordered  Taking 112 mcg levothyroxine daily  No clinical changes (thinks fatigue is from recent influenza)        Relevant Orders   TSH   Hyperlipidemia associated with type 2 diabetes mellitus (HCC)   Disc goals for lipids and reasons to control them Rev last labs with pt Rev low sat fat diet in detail   Sees cardiology  On repatha now along with zetia 10 mg daily   Expect continued improvement       Relevant Orders   Lipid panel   Hepatic function panel   Diabetes mellitus treated with injections of non-insulin medication (HCC) - Primary   Lab Results  Component Value Date   HGBA1C 6.4 (A) 09/10/2023   HGBA1C 6.9 (A) 05/28/2023   HGBA1C 6.5 12/01/2022   This is improved (despite recent influenza and prednisone course) Continues metformin xr 500 mg bid  Also semaglutide 1 mg weekly (is helping glucose/appetite and weight) If well tolerated in 3-4 weeks will plan to go up to 2 mg  Sent for eye exam report from 6 mo ago  No statin due to liver enzyme elevation   Follow up 3 months         Other   Tobacco abuse   Disc in detail risks of smoking and possible outcomes including copd, vascular/ heart disease, cancer , respiratory and sinus infections as well as osteoporosis  Pt voices understanding  Pt was able to avoid smoking for 2 weeks when she had the flu  Now back to less than 1/2 ppd Not motivated to quit yet   When further recovered will discuss immunizations       Morbid obesity (HCC)   Discussed how this problem influences overall health and the risks it imposes  Reviewed plan for weight loss with lower calorie diet (via better food choices (lower glycemic and portion control) along with exercise building up to or more than 30 minutes 5 days per week including some aerobic activity and strength training   Hope semaglutide will continue to help      Other Visit Diagnoses       Type 2 diabetes mellitus without complication, without long-term current use of insulin (HCC)       Relevant Orders   POCT HgB A1C (Completed)

## 2023-09-10 NOTE — Assessment & Plan Note (Signed)
Omeprazole may be adding to B12 def

## 2023-09-10 NOTE — Assessment & Plan Note (Signed)
Disc goals for lipids and reasons to control them Rev last labs with pt Rev low sat fat diet in detail   Sees cardiology  On repatha now along with zetia 10 mg daily   Expect continued improvement

## 2023-09-10 NOTE — Assessment & Plan Note (Signed)
TSH ordered  Taking 112 mcg levothyroxine daily  No clinical changes (thinks fatigue is from recent influenza)

## 2023-09-10 NOTE — Patient Instructions (Signed)
When feeling better   Add some strength training to your routine, this is important for bone and brain health and can reduce your risk of falls and help your body use insulin properly and regulate weight  Light weights, exercise bands , and internet videos are a good way to start  Yoga (chair or regular), machines , floor exercises or a gym with machines are also good options    Work on quitting smoking!   For diabetes Try to get most of your carbohydrates from produce (with the exception of white potatoes) and whole grains Eat less bread/pasta/rice/snack foods/cereals/sweets and other items from the middle of the grocery store (processed carbs) Continue the semaglutide at 1 mg - if you do well after a month of this we can go up on dose   Labs today

## 2023-09-10 NOTE — Assessment & Plan Note (Signed)
Discussed how this problem influences overall health and the risks it imposes  Reviewed plan for weight loss with lower calorie diet (via better food choices (lower glycemic and portion control) along with exercise building up to or more than 30 minutes 5 days per week including some aerobic activity and strength training   Hope semaglutide will continue to help

## 2023-09-10 NOTE — Assessment & Plan Note (Signed)
Lab Results  Component Value Date   HGBA1C 6.4 (A) 09/10/2023   HGBA1C 6.9 (A) 05/28/2023   HGBA1C 6.5 12/01/2022   This is improved (despite recent influenza and prednisone course) Continues metformin xr 500 mg bid  Also semaglutide 1 mg weekly (is helping glucose/appetite and weight) If well tolerated in 3-4 weeks will plan to go up to 2 mg  Sent for eye exam report from 6 mo ago  No statin due to liver enzyme elevation   Follow up 3 months

## 2023-09-11 MED ORDER — POTASSIUM CHLORIDE ER 10 MEQ PO TBCR: 20.0000 meq | EXTENDED_RELEASE_TABLET | Freq: Every day | ORAL | 1 refills | Status: DC

## 2023-09-11 NOTE — Addendum Note (Signed)
Addended by: Roxy Manns A on: 09/11/2023 08:20 PM   Modules accepted: Orders

## 2023-09-12 ENCOUNTER — Telehealth: Payer: Self-pay | Admitting: *Deleted

## 2023-09-12 NOTE — Telephone Encounter (Signed)
Copied from CRM [id number]. Topic: Clinical - Lab/Test Results >> Sep 12, 2023  4:33 PM Almira Coaster wrote: Reason for CRM: Patient received a call for labs, went over Dr.Towers message. Scheduled two week lab appointment.

## 2023-09-13 ENCOUNTER — Encounter: Payer: Self-pay | Admitting: Family Medicine

## 2023-09-20 DIAGNOSIS — R7989 Other specified abnormal findings of blood chemistry: Secondary | ICD-10-CM | POA: Diagnosis not present

## 2023-09-20 DIAGNOSIS — R682 Dry mouth, unspecified: Secondary | ICD-10-CM | POA: Diagnosis not present

## 2023-09-20 DIAGNOSIS — R768 Other specified abnormal immunological findings in serum: Secondary | ICD-10-CM | POA: Diagnosis not present

## 2023-09-20 DIAGNOSIS — R748 Abnormal levels of other serum enzymes: Secondary | ICD-10-CM | POA: Diagnosis not present

## 2023-09-20 DIAGNOSIS — H04123 Dry eye syndrome of bilateral lacrimal glands: Secondary | ICD-10-CM | POA: Diagnosis not present

## 2023-09-23 ENCOUNTER — Telehealth: Payer: Self-pay | Admitting: Family Medicine

## 2023-09-23 DIAGNOSIS — E876 Hypokalemia: Secondary | ICD-10-CM

## 2023-09-23 DIAGNOSIS — E039 Hypothyroidism, unspecified: Secondary | ICD-10-CM

## 2023-09-23 NOTE — Telephone Encounter (Signed)
-----   Message from Gerry Krone sent at 09/17/2023 10:46 AM EST ----- Regarding: lab orders for MON, 2.10.25 Lab orders, thanks

## 2023-09-24 ENCOUNTER — Other Ambulatory Visit (INDEPENDENT_AMBULATORY_CARE_PROVIDER_SITE_OTHER): Payer: Medicare HMO

## 2023-09-24 ENCOUNTER — Encounter: Payer: Self-pay | Admitting: Family Medicine

## 2023-09-24 ENCOUNTER — Other Ambulatory Visit: Payer: Self-pay | Admitting: Family Medicine

## 2023-09-24 DIAGNOSIS — E039 Hypothyroidism, unspecified: Secondary | ICD-10-CM | POA: Diagnosis not present

## 2023-09-24 DIAGNOSIS — E876 Hypokalemia: Secondary | ICD-10-CM

## 2023-09-24 LAB — BASIC METABOLIC PANEL
BUN: 5 mg/dL — ABNORMAL LOW (ref 6–23)
CO2: 31 meq/L (ref 19–32)
Calcium: 9.7 mg/dL (ref 8.4–10.5)
Chloride: 98 meq/L (ref 96–112)
Creatinine, Ser: 0.91 mg/dL (ref 0.40–1.20)
GFR: 70.21 mL/min (ref >60.00)
Glucose, Bld: 104 mg/dL — ABNORMAL HIGH (ref 70–99)
Potassium: 3.6 meq/L (ref 3.5–5.1)
Sodium: 141 meq/L (ref 135–145)

## 2023-09-24 LAB — TSH: TSH: 3.59 u[IU]/mL (ref 0.35–5.50)

## 2023-10-01 ENCOUNTER — Other Ambulatory Visit: Payer: Self-pay | Admitting: Family Medicine

## 2023-10-02 NOTE — Telephone Encounter (Signed)
 Last filled on 09/05/23 # 3 mL/ 0 refills  Last OV was 09/10/23

## 2023-10-02 NOTE — Telephone Encounter (Signed)
 Called patient would like to stay where she is for this refill. She would like to move up next refill

## 2023-10-02 NOTE — Telephone Encounter (Signed)
 Do you want to go up to the 2 mg dose?

## 2023-10-05 ENCOUNTER — Telehealth: Payer: Self-pay | Admitting: *Deleted

## 2023-10-05 ENCOUNTER — Telehealth: Payer: Self-pay | Admitting: Internal Medicine

## 2023-10-05 NOTE — Telephone Encounter (Signed)
 Pt c/o medication issue:  1. Name of Medication:  Evolocumab (REPATHA SURECLICK) 140 MG/ML SOAJ  2. How are you currently taking this medication (dosage and times per day)?   3. Are you having a reaction (difficulty breathing--STAT)?   4. What is your medication issue?   Patient states the past few times she received a prescription for Repatha 1 of the injectors has been ineffective. She states the company will send her more, but it always seems like there's 1 that doesn't completely work. She would like to know what to do because it interferes with her regimen.

## 2023-10-05 NOTE — Telephone Encounter (Signed)
 Patient verbalized she received  Repatha replacement  pens and only one of the Repatha pen worked. Patient reports the other three did not work. Advise patient that I would send  this for advise and someone would follow up. Patient verbalized she will reach to to her pharmacy and let us know if she able to refill prescription. Advise patient to call office and someone will reach out to her to make her aware if we are able to get additional pens. Patient verbalized an understanding.

## 2023-10-17 ENCOUNTER — Telehealth: Payer: Self-pay | Admitting: Pharmacist Clinician (PhC)/ Clinical Pharmacy Specialist

## 2023-10-17 NOTE — Telephone Encounter (Signed)
 Patient came into the office because she was having trouble with her Repatha prescription.  She had talked to one of the nurses previously about coming in, but no appointment had been made.  Took patient into exam room and reviewed injection technique.  Had her practice with demo pen.  She was then able to self inject her Repatha, without issue.  Answered all questions.

## 2023-10-22 ENCOUNTER — Other Ambulatory Visit: Payer: Medicare HMO

## 2023-10-22 NOTE — Telephone Encounter (Signed)
 Per chart review, patient came to office on 10/17/23 without an appt with PharmD for Repatha injection

## 2023-10-23 ENCOUNTER — Other Ambulatory Visit: Payer: Self-pay | Admitting: Family Medicine

## 2023-10-23 DIAGNOSIS — I10 Essential (primary) hypertension: Secondary | ICD-10-CM

## 2023-10-26 ENCOUNTER — Other Ambulatory Visit: Payer: Self-pay | Admitting: Family Medicine

## 2023-10-26 DIAGNOSIS — I10 Essential (primary) hypertension: Secondary | ICD-10-CM

## 2023-10-30 ENCOUNTER — Other Ambulatory Visit: Payer: Self-pay | Admitting: Family Medicine

## 2023-10-30 NOTE — Telephone Encounter (Signed)
 Last filled on 10/02/23 # 3 mL / 0 refills. Last OV was 09/10/23

## 2023-10-30 NOTE — Telephone Encounter (Signed)
 Do you want to go up to the 2 mg dose next?

## 2023-10-31 NOTE — Telephone Encounter (Signed)
 Please schedule follow up in May if not already scheduled

## 2023-10-31 NOTE — Telephone Encounter (Signed)
 Pt said she is doing good on med and has no issues and does want to proceed with increase dose

## 2023-11-01 NOTE — Telephone Encounter (Signed)
 Lvmtcb, sent mychart message

## 2023-11-02 NOTE — Telephone Encounter (Signed)
 Spoke to pt, scheduled f/u for 01/03/24

## 2023-11-06 ENCOUNTER — Other Ambulatory Visit: Payer: Self-pay | Admitting: Family Medicine

## 2023-11-06 DIAGNOSIS — J302 Other seasonal allergic rhinitis: Secondary | ICD-10-CM

## 2023-11-06 DIAGNOSIS — M792 Neuralgia and neuritis, unspecified: Secondary | ICD-10-CM

## 2023-11-07 ENCOUNTER — Telehealth: Payer: Self-pay | Admitting: Family Medicine

## 2023-11-07 ENCOUNTER — Other Ambulatory Visit: Payer: Self-pay | Admitting: Family Medicine

## 2023-11-07 NOTE — Telephone Encounter (Signed)
 Gabapentin last filled on 01/03/23 #150 caps/ 5 refills  F/u scheduled on 01/03/24  Xyzal to soon

## 2023-11-07 NOTE — Telephone Encounter (Signed)
 Copied from CRM [id number]. Topic: Clinical - Medication Question >> Nov 07, 2023 12:38 PM [name] wrote: Reason for CRM: Patient called in checking the status of her medication refill gabapentin (NEURONTIN) 100 MG capsule and albuterol (VENTOLIN HFA) 108 (90 Base) MCG/ACT inhaler. Informed the patient that the prescription has been reordered and she will be notified once medication has been filled

## 2023-11-08 ENCOUNTER — Other Ambulatory Visit: Payer: Self-pay | Admitting: Family Medicine

## 2023-11-08 DIAGNOSIS — M7918 Myalgia, other site: Secondary | ICD-10-CM

## 2023-11-08 DIAGNOSIS — M62838 Other muscle spasm: Secondary | ICD-10-CM

## 2023-11-08 NOTE — Telephone Encounter (Signed)
 Last filled on 05/14/23 #270 tabs/ 1 refill   F/u scheduled on 01/03/24

## 2023-11-12 ENCOUNTER — Telehealth: Payer: Self-pay

## 2023-11-12 MED ORDER — BLOOD GLUCOSE TEST VI STRP: 1.0000 | ORAL_STRIP | Freq: Every day | 0 refills | Status: AC

## 2023-11-12 NOTE — Telephone Encounter (Signed)
 Doesn't look like PCP has filled med before, last filled in 2020 by last PCP [facility])

## 2023-11-12 NOTE — Telephone Encounter (Signed)
 Copied from CRM [id number]. Topic: Clinical - Prescription Issue >> Nov 12, 2023 12:52 PM Sim Boast F wrote: Reason for CRM: Patient all out of test strips for accucheck monitor and needs them sent to pharmacy on file as soon as possible. Patient requested call back with an update

## 2023-11-12 NOTE — Telephone Encounter (Signed)
 Sent to her pharmacy.

## 2023-11-12 NOTE — Telephone Encounter (Signed)
 Marland Kitchen

## 2023-11-12 NOTE — Telephone Encounter (Signed)
 Pt notified Rx sent

## 2023-11-13 ENCOUNTER — Encounter: Payer: Self-pay | Admitting: Internal Medicine

## 2023-11-13 ENCOUNTER — Ambulatory Visit: Payer: Medicare HMO | Attending: Internal Medicine | Admitting: Internal Medicine

## 2023-11-13 VITALS — BP 108/62 | HR 71 | Ht 63.0 in | Wt 223.6 lb

## 2023-11-13 DIAGNOSIS — E119 Type 2 diabetes mellitus without complications: Secondary | ICD-10-CM | POA: Diagnosis not present

## 2023-11-13 DIAGNOSIS — R748 Abnormal levels of other serum enzymes: Secondary | ICD-10-CM

## 2023-11-13 DIAGNOSIS — I739 Peripheral vascular disease, unspecified: Secondary | ICD-10-CM

## 2023-11-13 DIAGNOSIS — T466X5D Adverse effect of antihyperlipidemic and antiarteriosclerotic drugs, subsequent encounter: Secondary | ICD-10-CM

## 2023-11-13 DIAGNOSIS — E785 Hyperlipidemia, unspecified: Secondary | ICD-10-CM

## 2023-11-13 DIAGNOSIS — T466X5A Adverse effect of antihyperlipidemic and antiarteriosclerotic drugs, initial encounter: Secondary | ICD-10-CM

## 2023-11-13 DIAGNOSIS — K719 Toxic liver disease, unspecified: Secondary | ICD-10-CM | POA: Diagnosis not present

## 2023-11-13 NOTE — Patient Instructions (Signed)
 Medication Instructions:  NO CHANGES *If you need a refill on your cardiac medications before your next appointment, please call your pharmacy*  Lab Work: FASTING lab work in 1 year (if not done thru PCP)  If you have labs (blood work) drawn today and your tests are completely normal, you will receive your results only by: MyChart Message (if you have MyChart) OR A paper copy in the mail If you have any lab test that is abnormal or we need to change your treatment, we will call you to review the results.   Follow-Up: At [facility], you and your health needs are our priority.  As part of our continuing mission to provide you with exceptional heart care, our providers are all part of one team.  This team includes your primary Cardiologist (physician) and Advanced Practice Providers or APPs (Physician Assistants and Nurse Practitioners) who all work together to provide you with the care you need, when you need it.  Your next appointment:   12 months with Dr. Rennis Golden  We recommend signing up for the patient portal called "MyChart".  Sign up information is provided on this After Visit Summary.  MyChart is used to connect with patients for Virtual Visits (Telemedicine).  Patients are able to view lab/test results, encounter notes, upcoming appointments, etc.  Non-urgent messages can be sent to your provider as well.   To learn more about what you can do with MyChart, go to [email or web address].        1st Floor: - Lobby - Registration  - Pharmacy  - Lab - Cafe  2nd Floor: - PV Lab - Diagnostic Testing (echo, CT, nuclear med)  3rd Floor: - Vacant  4th Floor: - TCTS (cardiothoracic surgery) - AFib Clinic - Structural Heart Clinic - Vascular Surgery  - Vascular Ultrasound  5th Floor: - HeartCare Cardiology (general and EP) - Clinical Pharmacy for coumadin, hypertension, lipid, weight-loss medications, and med management appointments    Valet parking services  will be available as well.

## 2023-11-13 NOTE — Progress Notes (Signed)
 OFFICE NOTE  Chief Complaint:  Follow-up  Primary Care Physician: Tower, Audrie Gallus, MD  HPI:  Teresa Mcgee has a history of type 2 diabetes, dyslipidemia, hypertension and history of statin intolerance to both atorvastatin and simvastatin, who presents for evaluation and management of dyslipidemia.  She recently had a lipid profile which demonstrated a total cholesterol of 204, triglycerides 117, HDL 46 and LDL 134.  Hemoglobin A1c is 5.8.  She has no known history of heart disease although she had a brother and father both who had heart disease in their 28s.  A CT scan of the abdomen and pelvis was done along with a CT angio of the chest in October 2019 for shortness of breath and upper abdominal pain, this did not suggest any coronary artery calcification however there is evidence for "peripheral calcified atherosclerotic plaque".  Based on this, she does need aggressive lipid-lowering and would target an LDL to less than 70.  Unfortunately she has not been able to tolerate statins having myalgias.  She is hesitant to try another statin at this point.  She was placed on ezetimibe however her cholesterol remains above target.  We discussed options today.  11/24/2020  Teresa Mcgee seems to be doing well on Nexletol.  She is tolerating it without any significant side effects.  Repeat labs 6 days ago showed total cholesterol 143, triglycerides 175, HDL 34 and LDL at 79.  This is just slightly above goal although she has been more active recently and has made some dietary changes.  I suspect hopefully over the next several months that her cholesterol will become even lower.  09/19/2021  Teresa Mcgee is seen today in follow-up.  Unfortunately her lipids have gone up in the past 6 months.  We are hopeful that with dietary changes and weight loss she would reach her target on Nexletol and ezetimibe, however her most recent lipids demonstrated total cholesterol 167, triglycerides 164, HDL 34 and LDL 100.   Her goal LDL is less than 70 given PAD.  Her TSH was also a little suppressed therefore her thyroid medication was reduced.  This would actually be usually associated with a lower than expected cholesterol.  Therefore I anticipated going higher.  She will need additional therapy.  01/24/2022  Teresa Mcgee is seen today in follow-up.  She reports she is tolerating rosuvastatin 5 mg every other day in addition to ezetimibe and Nexletol.  She did not have her lipids reassessed.  She will plan to get them done in [place] in the next few days.  She is wondering if she needs to continue all of them.  Overall I think if she is tolerating it well we could consider combining the Nexletol and Zetia use her pill burden.  07/31/2022  Teresa Mcgee returns today for follow-up.  Since I last saw her she has had an increase in her liver enzymes.  The slow rise now shows AST 133 and ALT 86.  Based on this and the fact that her LDL was well-controlled in July at 46, advised her to stop her every other day rosuvastatin but to continue ezetimibe and Nexletol.  She did see a gastroenterologist and had an autoimmune workup.  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  She may undergo liver biopsy in February.  Since she has now been off of her statin.  I advised repeating her lipid profile and her liver enzymes.  01/17/2023  Teresa Mcgee is seen today in follow-up.  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  She has been referred to [facility] for management options.  She has had minimal improvement in liver enzymes after stopping her statin.  This makes sense if there is an underlying autoimmune mechanism.  Statin however could worsen her hepatitis as could the Nexletol and to a much lesser extent ezetimibe.  Based on that however stopping those medications would lead to higher cholesterol which is already well above her target  LDL less than 70.  She will therefore need additional therapy and would do much better in my opinion on a PCSK9 inhibitor, medication less likely to cause any worsening hepatitis.  08/24/2023  Teresa Mcgee is seen today in follow-up.  She was seen at [facility] and also at [facility] for workup of hepatitis.  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  She has come off of her statin and Nexletol but continues on Zetia.  I had started her on Repatha which she felt was helpful however she ran into an issue with the pens that apparently did not work.  She received several different injectors and ultimately contacted the company and returned them but has not received a replacement.  She has not had repeat lipid testing on therapy.  11/13/2023  Teresa Mcgee returns today for follow-up.  I previously been following her via virtual visits.  She was a patient of Dr. Mariah Milling in [place] but has not been back for general cardiology follow-up.  Her lipids still remain well-controlled.  She had recent labs showing total cholesterol 175, HDL 74, triglycerides 196 and LDL 61.  She was having problems with the injector pens however she did have an appointment with our pharmacist to showed her the correct injecting technique and subsequently it is gone very well.  PMHx:  Past Medical History:  Diagnosis Date   Arthritis    Bell's palsy may 2012   Chest pain    Diabetes mellitus without complication (HCC)    GERD (gastroesophageal reflux disease)    H/O hiatal hernia    Hyperlipidemia    Hypertension    Hypothyroidism    Shingles    Shortness of breath    Sleep apnea    uses cpap    Past Surgical History:  Procedure Laterality Date   ABDOMINAL HYSTERECTOMY     CESAREAN SECTION     cesarian     3x   COLONOSCOPY WITH PROPOFOL N/A 11/27/2019   Procedure: COLONOSCOPY WITH PROPOFOL;  Surgeon: Wyline Mood, MD;  Location: [facility] ENDOSCOPY;  Service:  Gastroenterology;  Laterality: N/A;   ESOPHAGOGASTRODUODENOSCOPY (EGD) WITH PROPOFOL N/A 07/20/2020   Procedure: ESOPHAGOGASTRODUODENOSCOPY (EGD) WITH PROPOFOL;  Surgeon: Wyline Mood, MD;  Location: [facility] ENDOSCOPY;  Service: Gastroenterology;  Laterality: N/A;   PARTIAL HYSTERECTOMY  2000   abnormal uterine bleeding   THYROIDECTOMY  1998   TOOTH EXTRACTION     TUBAL LIGATION      FAMHx:  Family History  Problem Relation Age of Onset   Heart attack Father 83       deceased   Diabetes Brother    Stroke Brother 25       twice   Stroke Sister 45   Diabetes Sister    Heart attack Sister 78   Heart attack Brother 39   Breast cancer Maternal Aunt     SOCHx:   reports  that she has been smoking cigarettes. She has a 10 pack-year smoking history. She has never used smokeless tobacco. She reports that she does not drink alcohol and does not use drugs.  ALLERGIES:  Allergies  Allergen Reactions   Statins     myopathy    ROS: Pertinent items noted in HPI and remainder of comprehensive ROS otherwise negative.  HOME MEDS: Current Outpatient Medications on File Prior to Visit  Medication Sig Dispense Refill   Accu-Chek FastClix Lancets MISC USE UP TO 4 TIMES DAILY AS DIRECTED 102 each 1   ACCU-CHEK GUIDE test strip USE UP TO 4 TIMES DAILY AS DIRECTED 100 strip 5   albuterol (VENTOLIN HFA) 108 (90 Base) MCG/ACT inhaler INHALE 2 PUFFS INTO THE LUNGS EVERY 4 HOURS AS NEEDED FOR (COUGH, SHORTNESS OF BREATH OR WHEEZING) 8.5 each 2   amLODipine (NORVASC) 10 MG tablet TAKE 1 TABLET BY MOUTH EVERY DAY 90 tablet 2   azelastine (OPTIVAR) 0.05 % ophthalmic solution Place 1 drop into both eyes 2 (two) times daily as needed. 6 mL 3   blood glucose meter kit and supplies Dispense based on patient and insurance preference. Use up to four times daily as directed. (FOR ICD-10 E10.9, E11.9). 1 each 0   [employer] D3 125 MCG (5000 UT) capsule TAKE 1 CAPSULE BY MOUTH DAILY WITH BREAKFAST. TAKE ALONG WITH CALCIUM  AND MAGNESIUM.  5   ELDERBERRY PO Take by mouth.     Evolocumab (REPATHA SURECLICK) 140 MG/ML SOAJ Inject 140 mg into the skin every 14 (fourteen) days. 6 mL 3   ezetimibe (ZETIA) 10 MG tablet TAKE 1 TABLET BY MOUTH EVERY DAY 90 tablet 2   fluticasone (FLONASE) 50 MCG/ACT nasal spray SPRAY 2 SPRAYS INTO EACH NOSTRIL EVERY DAY 48 mL 1   gabapentin (NEURONTIN) 100 MG capsule TAKE 1 CAPSULE (100 MG TOTAL) BY MOUTH 2 (TWO) TIMES DAILY AND 3 CAPSULES (300 MG TOTAL) AT BEDTIME. 150 capsule 3   Glucose Blood (BLOOD GLUCOSE TEST STRIPS) STRP 1 each by In Vitro route daily. May substitute to any manufacturer covered by patient's insurance. 100 strip 0   hydrochlorothiazide (HYDRODIURIL) 25 MG tablet TAKE 1 TABLET (25 MG TOTAL) BY MOUTH DAILY. AS DIRECTED 90 tablet 1   levocetirizine (XYZAL) 5 MG tablet TAKE 1 TABLET BY MOUTH EVERY DAY IN THE EVENING 90 tablet 1   levothyroxine (SYNTHROID) 112 MCG tablet TAKE 1 TABLET BY MOUTH DAILY BEFORE BREAKFAST. 90 tablet 0   Magnesium Oxide -Mg Supplement 500 MG CAPS TAKE 1 CAPSULE BY MOUTH TWICE A DAY (AT 8AM & 10PM) 180 capsule 0   meloxicam (MOBIC) 15 MG tablet Take one pill by mouth with food every other day as needed for pain 45 tablet 1   metFORMIN (GLUCOPHAGE-XR) 500 MG 24 hr tablet TAKE 1 TABLET BY MOUTH 2 TIMES DAILY WITH A MEAL. 180 tablet 0   naloxone (NARCAN) nasal spray 4 mg/0.1 mL Place 1 spray into the nose as needed for up to 365 doses (for opioid-induced respiratory depresssion). In case of emergency (overdose), spray once into each nostril. If no response within 3 minutes, repeat application and call 911. 1 each 0   Olopatadine HCl 0.2 % SOLN INSTILL 1 DROP INTO BOTH EYES EVERY DAY AS NEEDED     oxyCODONE (OXY IR/ROXICODONE) 5 MG immediate release tablet Take 1 tablet (5 mg total) by mouth every 8 (eight) hours as needed for severe pain (pain score 7-10). Must last 30 days 90 tablet 0  oxyCODONE (OXY IR/ROXICODONE) 5 MG immediate release tablet Take 1  tablet (5 mg total) by mouth every 8 (eight) hours as needed for severe pain (pain score 7-10). Must last 30 days 90 tablet 0   oxyCODONE (OXY IR/ROXICODONE) 5 MG immediate release tablet Take 1 tablet (5 mg total) by mouth every 8 (eight) hours as needed for severe pain (pain score 7-10). Must last 30 days 90 tablet 0   pantoprazole (PROTONIX) 40 MG tablet Take 1 tablet (40 mg total) by mouth daily. 90 tablet 1   potassium chloride (KLOR-CON) 10 MEQ tablet Take 2 tablets (20 mEq total) by mouth daily. 180 tablet 1   REFRESH CELLUVISC 1 % GEL APPLY 1 DROP TO EYE 3 (THREE) TIMES DAILY.  12   Semaglutide, 2 MG/DOSE, 8 MG/3ML SOPN Inject 2 mg as directed once a week. 3 mL 0   tiZANidine (ZANAFLEX) 4 MG tablet TAKE 1 TABLET (4 MG TOTAL) BY MOUTH EVERY 8 (EIGHT) HOURS AS NEEDED FOR MUSCLE SPASMS 270 tablet 1   No current facility-administered medications on file prior to visit.    LABS/IMAGING: No results found for this or any previous visit (from the past 48 hours). No results found.  LIPID PANEL:    Component Value Date/Time   CHOL 175 09/10/2023 1159   CHOL 143 11/18/2020 1055   TRIG 196.0 (H) 09/10/2023 1159   HDL 74.40 09/10/2023 1159   HDL 34 (L) 11/18/2020 1055   CHOLHDL 2 09/10/2023 1159   VLDL 39.2 09/10/2023 1159   LDLCALC 61 09/10/2023 1159   LDLCALC 79 11/18/2020 1055   LDLCALC 159 (H) 11/16/2017 1529   LDLDIRECT 103.0 12/01/2022 1150     WEIGHTS: Wt Readings from Last 3 Encounters:  11/13/23 223 lb 9.6 oz (101.4 kg)  09/10/23 227 lb (103 kg)  09/07/23 227 lb 6.4 oz (103.1 kg)    VITALS: BP 108/62 (BP Location: Left Arm, Patient Position: Sitting)   Pulse 71   Ht 5\' 3"  (1.6 m)   Wt 223 lb 9.6 oz (101.4 kg)   LMP  (LMP Unknown)   SpO2 95%   BMI 39.61 kg/m   EXAM: General appearance: alert and no distress Lungs: clear to auscultation bilaterally Heart: regular rate and rhythm, S1, S2 normal, no murmur, click, rub or gallop Extremities: extremities normal,  atraumatic, no cyanosis or edema Neurologic: Grossly normal  EKG: Deferred  ASSESSMENT: Mixed dyslipidemia, goal LDL less than 70 Type 2 diabetes Hypertension Family history of premature coronary disease PAD with calcified peripheral atherosclerosis Statin intolerant-myalgias 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   Ms. Bakke has a mixed dyslipidemia and is reached a target LDL less than 70 on Repatha.  She is doing well with this.  Blood pressure is also well-controlled.  She denies any claudication but does have leg pain related to neuropathy.  She is noted to have PAD and had carotid Dopplers in the past with no significant stenosis.  Her ALT is normal as of January 36.  A1c 6.4%.  Will plan to continue current therapies.  Follow-up with me annually for general cardiology and lipid management.  Chrystie Nose, MD, [facility], FACP  [place]  [facility]  Medical Director of the Advanced Lipid Disorders &  Cardiovascular Risk Reduction Clinic Diplomate of the American Board of Clinical Lipidology Attending Cardiologist  Direct Dial: [phone]  Fax: [phone]  Website:  [email or web address] Nicely Abiha Lukehart 11/13/2023, 9:54 AM

## 2023-11-17 ENCOUNTER — Other Ambulatory Visit: Payer: Self-pay | Admitting: Family Medicine

## 2023-11-20 ENCOUNTER — Other Ambulatory Visit: Payer: Self-pay | Admitting: Family Medicine

## 2023-11-21 ENCOUNTER — Encounter: Payer: Medicare HMO | Admitting: Pain Medicine

## 2023-11-22 ENCOUNTER — Encounter: Payer: Self-pay | Admitting: Podiatry

## 2023-11-22 ENCOUNTER — Ambulatory Visit (INDEPENDENT_AMBULATORY_CARE_PROVIDER_SITE_OTHER): Payer: Medicare HMO | Admitting: Podiatry

## 2023-11-22 DIAGNOSIS — E119 Type 2 diabetes mellitus without complications: Secondary | ICD-10-CM

## 2023-11-22 DIAGNOSIS — B351 Tinea unguium: Secondary | ICD-10-CM | POA: Diagnosis not present

## 2023-11-22 DIAGNOSIS — M79675 Pain in left toe(s): Secondary | ICD-10-CM

## 2023-11-22 DIAGNOSIS — M79674 Pain in right toe(s): Secondary | ICD-10-CM
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
## 2023-11-22 NOTE — Progress Notes (Signed)
  Subjective:  Patient ID: Teresa Mcgee, female    DOB: [birth year],  MRN: [id number]  SHERI GATCHEL presents to clinic today for preventative diabetic foot care and painful, elongated thickened toenails x 10 which are symptomatic when wearing enclosed shoe gear. This interferes with his/her daily activities.  Chief Complaint  Patient presents with   Diabetes    "Cut my nails."  Dr. Roxy Manns - 09/10/2023; A1c - 6.4   New problem(s): None.   PCP is Tower, Audrie Gallus, MD.  Allergies  Allergen Reactions   Statins     myopathy    Review of Systems: Negative except as noted in the HPI.  Objective: No changes noted in today's physical examination. There were no vitals filed for this visit. Teresa Mcgee is a pleasant 58 y.o. female morbidly obese in NAD. AAO x 3.  Vascular Examination: CFT <3 seconds b/l. DP/PT pulses faintly palpable b/l. Skin temperature gradient warm to warm b/l. No pain with calf compression. No ischemia or gangrene. No cyanosis or clubbing noted b/l. Pedal hair absent.   Neurological Examination: Sensation grossly intact b/l with 10 gram monofilament. Vibratory sensation intact b/l.   Dermatological Examination: Pedal skin warm and supple b/l.   No open wounds. No interdigital macerations.  Toenails 1-5 b/l thick, discolored, elongated with subungual debris and pain on dorsal palpation.    No corns, calluses nor porokeratotic lesions noted.  Musculoskeletal Examination: Muscle strength 5/5 to all lower extremity muscle groups bilaterally. Pes planus deformity noted bilateral LE.  Radiographs: None  Last A1c:      Latest Ref Rng & Units 09/10/2023   11:22 AM 05/28/2023   11:37 AM 12/01/2022   11:50 AM  Hemoglobin A1C  Hemoglobin-A1c 4.0 - 5.6 % 6.4  6.9  6.5    Assessment/Plan: 1. Pain due to onychomycosis of toenails of both feet   2. Type 2 diabetes mellitus without complication, without long-term current use of insulin (HCC)     Consent given for treatment. Patient examined. All patient's and/or POA's questions/concerns addressed on today's visit. Toenails 1-5 debrided in length and girth without incident. Continue foot and shoe inspections daily. Monitor blood glucose per PCP/Endocrinologist's recommendations. Continue soft, supportive shoe gear daily. Report any pedal injuries to medical professional. Call office if there are any questions/concerns. -Patient/POA to call should there be question/concern in the interim.   Return in about 3 months (around 02/21/2024).  Freddie Breech, DPM      [place] LOCATION: [address], [place] [address]                   Office [phone]   [facility] LOCATION: [address] [place], [place] [address] Office [phone]

## 2023-11-23 ENCOUNTER — Encounter: Payer: Self-pay | Admitting: Nurse Practitioner

## 2023-11-23 ENCOUNTER — Ambulatory Visit: Attending: Pain Medicine | Admitting: Nurse Practitioner

## 2023-11-23 VITALS — BP 140/69 | HR 84 | Temp 97.3°F | Resp 16 | Ht 63.0 in | Wt 223.0 lb

## 2023-11-23 DIAGNOSIS — M542 Cervicalgia: Secondary | ICD-10-CM | POA: Diagnosis not present

## 2023-11-23 DIAGNOSIS — M25562 Pain in left knee: Secondary | ICD-10-CM | POA: Insufficient documentation

## 2023-11-23 DIAGNOSIS — G8929 Other chronic pain: Secondary | ICD-10-CM | POA: Diagnosis not present

## 2023-11-23 DIAGNOSIS — G894 Chronic pain syndrome: Secondary | ICD-10-CM | POA: Insufficient documentation

## 2023-11-23 DIAGNOSIS — M79601 Pain in right arm: Secondary | ICD-10-CM | POA: Insufficient documentation

## 2023-11-23 DIAGNOSIS — M79605 Pain in left leg: Secondary | ICD-10-CM | POA: Diagnosis not present

## 2023-11-23 DIAGNOSIS — F1120 Opioid dependence, uncomplicated: Secondary | ICD-10-CM | POA: Insufficient documentation

## 2023-11-23 DIAGNOSIS — M79602 Pain in left arm: Secondary | ICD-10-CM | POA: Insufficient documentation

## 2023-11-23 DIAGNOSIS — Z79891 Long term (current) use of opiate analgesic: Secondary | ICD-10-CM | POA: Insufficient documentation

## 2023-11-23 DIAGNOSIS — Z79899 Other long term (current) drug therapy: Secondary | ICD-10-CM | POA: Diagnosis present

## 2023-11-23 DIAGNOSIS — M25561 Pain in right knee: Secondary | ICD-10-CM | POA: Diagnosis not present

## 2023-11-23 DIAGNOSIS — M545 Low back pain, unspecified: Secondary | ICD-10-CM | POA: Insufficient documentation

## 2023-11-23 DIAGNOSIS — M533 Sacrococcygeal disorders, not elsewhere classified: Secondary | ICD-10-CM | POA: Insufficient documentation

## 2023-11-23 DIAGNOSIS — M47816 Spondylosis without myelopathy or radiculopathy, lumbar region: Secondary | ICD-10-CM | POA: Diagnosis present

## 2023-11-23 DIAGNOSIS — M25551 Pain in right hip: Secondary | ICD-10-CM | POA: Insufficient documentation

## 2023-11-23 DIAGNOSIS — M25552 Pain in left hip: Secondary | ICD-10-CM | POA: Insufficient documentation

## 2023-11-23 DIAGNOSIS — M79604 Pain in right leg: Secondary | ICD-10-CM | POA: Diagnosis not present

## 2023-11-23 MED ORDER — OXYCODONE HCL 5 MG PO TABS
5.0000 mg | ORAL_TABLET | Freq: Three times a day (TID) | ORAL | 0 refills | Status: DC | PRN
Start: 2024-01-24 — End: 2024-02-19

## 2023-11-23 MED ORDER — OXYCODONE HCL 5 MG PO TABS: 5.0000 mg | ORAL_TABLET | Freq: Three times a day (TID) | ORAL | 0 refills | Status: DC | PRN

## 2023-11-23 NOTE — Progress Notes (Signed)
 Nursing Pain Medication Assessment:  Safety precautions to be maintained throughout the outpatient stay will include: orient to surroundings, keep bed in low position, maintain call bell within reach at all times, provide assistance with transfer out of bed and ambulation.  Medication Inspection Compliance: Pill count conducted under aseptic conditions, in front of the patient. Neither the pills nor the bottle was removed from the patient's sight at any time. Once count was completed pills were immediately returned to the patient in their original bottle.  Medication: Oxycodone IR Pill/Patch Count:  11 of 90 pills remain Pill/Patch Appearance: Markings consistent with prescribed medication Bottle Appearance: Standard pharmacy container. Clearly labeled. Filled Date: 3 / 80 / 2025 Last Medication intake:  Today

## 2023-11-23 NOTE — Patient Instructions (Addendum)
 Pain Management Discharge Instructions  General Discharge Instructions :  If you need to reach your doctor call: Monday-Friday 8:00 am - 4:00 pm at [phone] or toll free [phone].  After clinic hours [phone] to have operator reach doctor.  Bring all of your medication bottles to all your appointments in the pain clinic.  To cancel or reschedule your appointment with Pain Management please remember to call 24 hours in advance to avoid a fee.  Refer to the educational materials which you have been given on: General Risks, I had my Procedure. Discharge Instructions, Post Sedation.  Post Procedure Instructions:  The drugs you were given will stay in your system until tomorrow, so for the next 24 hours you should not drive, make any legal decisions or drink any alcoholic beverages.  You may eat anything you prefer, but it is better to start with liquids then soups and crackers, and gradually work up to solid foods.  Please notify your doctor immediately if you have any unusual bleeding, trouble breathing or pain that is not related to your normal pain.  Depending on the type of procedure that was done, some parts of your body may feel week and/or numb.  This usually clears up by tonight or the next day.  Walk with the use of an assistive device or accompanied by an adult for the 24 hours.  You may use ice on the affected area for the first 24 hours.  Put ice in a Ziploc bag and cover with a towel and place against area 15 minutes on 15 minutes off.  You may switch to heat after 24 hours. ______________________________________________________________________    Opioid Pain Medication Update  To: All patients taking opioid pain medications. (I.e.: hydrocodone, hydromorphone, oxycodone, oxymorphone, morphine, codeine, methadone, tapentadol, tramadol, buprenorphine, fentanyl, etc.)  Re: Updated review of side effects and adverse reactions of opioid analgesics, as well as new  information about long term effects of this class of medications.  Direct risks of long-term opioid therapy are not limited to opioid addiction and overdose. Potential medical risks include serious fractures, breathing problems during sleep, hyperalgesia, immunosuppression, chronic constipation, bowel obstruction, myocardial infarction, and tooth decay secondary to xerostomia.  Unpredictable adverse effects that can occur even if you take your medication correctly: Cognitive impairment, respiratory depression, and death. Most people think that if they take their medication "correctly", and "as instructed", that they will be safe. Nothing could be farther from the truth. In reality, a significant amount of recorded deaths associated with the use of opioids has occurred in individuals that had taken the medication for a long time, and were taking their medication correctly. The following are examples of how this can happen: Patient taking his/her medication for a long time, as instructed, without any side effects, is given a certain antibiotic or another unrelated medication, which in turn triggers a "Drug-to-drug interaction" leading to disorientation, cognitive impairment, impaired reflexes, respiratory depression or an untoward event leading to serious bodily harm or injury, including death.  Patient taking his/her medication for a long time, as instructed, without any side effects, develops an acute impairment of liver and/or kidney function. This will lead to a rapid inability of the body to breakdown and eliminate their pain medication, which will result in effects similar to an "overdose", but with the same medicine and dose that they had always taken. This again may lead to disorientation, cognitive impairment, impaired reflexes, respiratory depression or an untoward event leading to serious bodily harm or injury, including  death.  A similar problem will occur with patients as they grow older and their  liver and kidney function begins to decrease as part of the aging process.  Background information: Historically, the original case for using long-term opioid therapy to treat chronic noncancer pain was based on safety assumptions that subsequent experience has called into question. In 1996, the American Pain Society and the American Academy of Pain Medicine issued a consensus statement supporting long-term opioid therapy. This statement acknowledged the dangers of opioid prescribing but concluded that the risk for addiction was low; respiratory depression induced by opioids was short-lived, occurred mainly in opioid-naive patients, and was antagonized by pain; tolerance was not a common problem; and efforts to control diversion should not constrain opioid prescribing. This has now proven to be wrong. Experience regarding the risks for opioid addiction, misuse, and overdose in community practice has failed to support these assumptions.  According to the Centers for Disease Control and Prevention, fatal overdoses involving opioid analgesics have increased sharply over the past decade. Currently, more than 96,700 people die from drug overdoses every year. Opioids are a factor in 7 out of every 10 overdose deaths. Deaths from drug overdose have surpassed motor vehicle accidents as the leading cause of death for individuals between the ages of 48 and 37.  Clinical data suggest that neuroendocrine dysfunction may be very common in both men and women, potentially causing hypogonadism, erectile dysfunction, infertility, decreased libido, osteoporosis, and depression. Recent studies linked higher opioid dose to increased opioid-related mortality. Controlled observational studies reported that long-term opioid therapy may be associated with increased risk for cardiovascular events. Subsequent meta-analysis concluded that the safety of long-term opioid therapy in elderly patients has not been proven.   Side Effects  and adverse reactions: Common side effects: Drowsiness (sedation). Dizziness. Nausea and vomiting. Constipation. Physical dependence -- Dependence often manifests with withdrawal symptoms when opioids are discontinued or decreased. Tolerance -- As you take repeated doses of opioids, you require increased medication to experience the same effect of pain relief. Respiratory depression -- This can occur in healthy people, especially with higher doses. However, people with COPD, asthma or other lung conditions may be even more susceptible to fatal respiratory impairment.  Uncommon side effects: An increased sensitivity to feeling pain and extreme response to pain (hyperalgesia). Chronic use of opioids can lead to this. Delayed gastric emptying (the process by which the contents of your stomach are moved into your small intestine). Muscle rigidity. Immune system and hormonal dysfunction. Quick, involuntary muscle jerks (myoclonus). Arrhythmia. Itchy skin (pruritus). Dry mouth (xerostomia).  Long-term side effects: Chronic constipation. Sleep-disordered breathing (SDB). Increased risk of bone fractures. Hypothalamic-pituitary-adrenal dysregulation. Increased risk of overdose.  RISKS: Respiratory depression and death: Opioids increase the risk of respiratory depression and death.  Drug-to-drug interactions: Opioids are relatively contraindicated in combination with benzodiazepines, sleep inducers, and other central nervous system depressants. Other classes of medications (i.e.: certain antibiotics and even over-the-counter medications) may also trigger or induce respiratory depression in some patients.  Medical conditions: Patients with pre-existing respiratory problems are at higher risk of respiratory failure and/or depression when in combination with opioid analgesics. Opioids are relatively contraindicated in some medical conditions such as central sleep apnea.   Fractures and Falls:   Opioids increase the risk and incidence of falls. This is of particular importance in elderly patients.  Endocrine System:  Long-term administration is associated with endocrine abnormalities (endocrinopathies). (Also known as Opioid-induced Endocrinopathy) Influences on both the hypothalamic-pituitary-adrenal axis?and the hypothalamic-pituitary-gonadal  axis have been demonstrated with consequent hypogonadism and adrenal insufficiency in both sexes. Hypogonadism and decreased levels of dehydroepiandrosterone sulfate have been reported in men and women. Endocrine effects include: Amenorrhoea in women (abnormal absence of menstruation) Reduced libido in both sexes Decreased sexual function Erectile dysfunction in men Hypogonadisms (decreased testicular function with shrinkage of testicles) Infertility Depression and fatigue Loss of muscle mass Anxiety Depression Immune suppression Hyperalgesia Weight gain Anemia Osteoporosis Patients (particularly women of childbearing age) should avoid opioids. There is insufficient evidence to recommend routine monitoring of asymptomatic patients taking opioids in the long-term for hormonal deficiencies.  Immune System: Human studies have demonstrated that opioids have an immunomodulating effect. These effects are mediated via opioid receptors both on immune effector cells and in the central nervous system. Opioids have been demonstrated to have adverse effects on antimicrobial response and anti-tumour surveillance. Buprenorphine has been demonstrated to have no impact on immune function.  Opioid Induced Hyperalgesia: Human studies have demonstrated that prolonged use of opioids can lead to a state of abnormal pain sensitivity, sometimes called opioid induced hyperalgesia (OIH). Opioid induced hyperalgesia is not usually seen in the absence of tolerance to opioid analgesia. Clinically, hyperalgesia may be diagnosed if the patient on long-term  opioid therapy presents with increased pain. This might be qualitatively and anatomically distinct from pain related to disease progression or to breakthrough pain resulting from development of opioid tolerance. Pain associated with hyperalgesia tends to be more diffuse than the pre-existing pain and less defined in quality. Management of opioid induced hyperalgesia requires opioid dose reduction.  Cancer: Chronic opioid therapy has been associated with an increased risk of cancer among noncancer patients with chronic pain. This association was more evident in chronic strong opioid users. Chronic opioid consumption causes significant pathological changes in the small intestine and colon. Epidemiological studies have found that there is a link between opium dependence and initiation of gastrointestinal cancers. Cancer is the second leading cause of death after cardiovascular disease. Chronic use of opioids can cause multiple conditions such as GERD, immunosuppression and renal damage as well as carcinogenic effects, which are associated with the incidence of cancers.   Mortality: Long-term opioid use has been associated with increased mortality among patients with chronic non-cancer pain (CNCP).  Prescription of long-acting opioids for chronic noncancer pain was associated with a significantly increased risk of all-cause mortality, including deaths from causes other than overdose.  Reference: Von Korff M, Kolodny A, Deyo RA, Chou R. Long-term opioid therapy reconsidered. Ann Intern Med. 2011 Sep 6;155(5):325-8. doi: 10.7326/[id number]. PMID: 78295621; PMCID: [id number]. Randon Goldsmith, Hayward RA, Dunn KM, [place] KP. Risk of adverse events in patients prescribed long-term opioids: A cohort study in the [place] Clinical Practice Research Datalink. Eur J Pain. 2019 May;23(5):908-922. doi: 10.1002/ejp.1357. Epub 2019 Jan 31. PMID: 62952841. Colameco S, Coren JS, [place] CA.  Continuous opioid treatment for chronic noncancer pain: a time for moderation in prescribing. Postgrad Med. 2009 Jul;121(4):61-6. doi: 10.3810/pgm.2009.07.2032. PMID: 32440102. William Hamburger RN, [place], Blazina I, Cristopher [place], Bougatsos C, Deyo RA. The effectiveness and risks of long-term opioid therapy for chronic pain: a systematic review for a [employer] of [employer] Pathways to [employer]. Ann Intern Med. 2015 Feb 17;162(4):276-86. doi: 10.7326/M14-2559. PMID: 72536644. [facility], [place] DM. NCHS Data Brief No. 22. [place]: Centers for Disease Control and Prevention; 2009. Sep, Increase in Fatal Poisonings Involving Opioid Analgesics in the [place], 1999-2006. Geraldine Contras, Choi HR, Oh  TK. Long-term opioid use and mortality in patients with chronic non-cancer pain: Ten-year follow-up study in [place] from 2010 through 2019. EClinicalMedicine. 2022 Jul 18;51:101558. doi: 10.1016/j.eclinm.2022.409811. PMID: 91478295; PMCID: [id number]. Huser, W., Schubert, T., Vogelmann, T. et al. All-cause mortality in patients with long-term opioid therapy compared with non-opioid analgesics for chronic non-cancer pain: a database study. BMC Med 18, 162 (2020). http://lester.info/ Rashidian H, Karie Kirks, Malekzadeh R, Haghdoost AA. An Ecological Study of the Association between Opiate Use and Incidence of Cancers. Addict Health. 2016 Fall;8(4):252-260. PMID: 46962952; PMCID: [id number].  Our Goal: Our goal is to control your pain with means other than the use of opioid pain medications.  Our Recommendation: Talk to your physician about coming off of these medications. We can assist you with the tapering down and stopping these medicines. Based on the new information, even if you cannot completely stop the medication, a decrease in the dose may be associated with a lesser risk. Ask for other means of controlling the pain. Decrease  or eliminate those factors that significantly contribute to your pain such as smoking, obesity, and a diet heavily tilted towards "inflammatory" nutrients.  Last Updated: 02/19/2023   ______________________________________________________________________       ______________________________________________________________________    Medication Rules  Purpose: To inform patients, and their family members, of our medication rules and regulations.  Applies to: All patients receiving prescriptions from our practice (written or electronic).  Pharmacy of record: This is the pharmacy where your electronic prescriptions will be sent. Make sure we have the correct one.  Electronic prescriptions: In compliance with the [facility] Strengthen Opioid Misuse Prevention (STOP) Act of 2017 (Session Conni Elliot [phone]), effective August 14, 2018, all controlled substances must be electronically prescribed. Written prescriptions, faxing, or calling prescriptions to a pharmacy will no longer be done.  Prescription refills: These will be provided only during in-person appointments. No medications will be renewed without a "face-to-face" evaluation with your provider. Applies to all prescriptions.  NOTE: The following applies primarily to controlled substances (Opioid* Pain Medications).   Type of encounter (visit): For patients receiving controlled substances, face-to-face visits are required. (Not an option and not up to the patient.)  Patient's Responsibilities: Pain Pills: Bring all pain pills to every appointment (except for procedure appointments). Pill counts are required.  Pill Bottles: Bring pills in original pharmacy bottle. Bring bottle, even if empty. Always bring the bottle of the most recent fill.  Medication refills: You are responsible for knowing and keeping track of what medications you are taking and when is it that you will need a refill. The day before your appointment: write a list  of all prescriptions that need to be refilled. The day of the appointment: give the list to the admitting nurse. Prescriptions will be written only during appointments. No prescriptions will be written on procedure days. If you forget a medication: it will not be "Called in", "Faxed", or "electronically sent". You will need to get another appointment to get these prescribed. No early refills. Do not call asking to have your prescription filled early. Partial  or short prescriptions: Occasionally your pharmacy may not have enough pills to fill your prescription.  NEVER ACCEPT a partial fill or a prescription that is short of the total amount of pills that you were prescribed.  With controlled substances the law allows 72 hours for the pharmacy to complete the prescription.  If the prescription is not completed within 72 hours, the pharmacist will require a new prescription  to be written. This means that you will be short on your medicine and we WILL NOT send another prescription to complete your original prescription.  Instead, request the pharmacy to send a carrier to a nearby branch to get enough medication to provide you with your full prescription. Prescription Accuracy: You are responsible for carefully inspecting your prescriptions before leaving our office. Have the discharge nurse carefully go over each prescription with you, before taking them home. Make sure that your name is accurately spelled, that your address is correct. Check the name and dose of your medication to make sure it is accurate. Check the number of pills, and the written instructions to make sure they are clear and accurate. Make sure that you are given enough medication to last until your next medication refill appointment. Taking Medication: Take medication as prescribed. When it comes to controlled substances, taking less pills or less frequently than prescribed is permitted and encouraged. Never take more pills than  instructed. Never take the medication more frequently than prescribed.  Inform other Doctors: Always inform, all of your healthcare providers, of all the medications you take. Pain Medication from other Providers: You are not allowed to accept any additional pain medication from any other Doctor or Healthcare provider. There are two exceptions to this rule. (see below) In the event that you require additional pain medication, you are responsible for notifying us, as stated below. Cough Medicine: Often these contain an opioid, such as codeine or hydrocodone. Never accept or take cough medicine containing these opioids if you are already taking an opioid* medication. The combination may cause respiratory failure and death. Medication Agreement: You are responsible for carefully reading and following our Medication Agreement. This must be signed before receiving any prescriptions from our practice. Safely store a copy of your signed Agreement. Violations to the Agreement will result in no further prescriptions. (Additional copies of our Medication Agreement are available upon request.) Laws, Rules, & Regulations: All patients are expected to follow all [address] and [employer], Rules, [employer]. Ignorance of the Laws does not constitute a valid excuse.  Illegal drugs and Controlled Substances: The use of illegal substances (including, but not limited to marijuana and its derivatives) and/or the illegal use of any controlled substances is strictly prohibited. Violation of this rule may result in the immediate and permanent discontinuation of any and all prescriptions being written by our practice. The use of any illegal substances is prohibited. Adopted CDC guidelines & recommendations: Target dosing levels will be at or below 60 MME/day. Use of benzodiazepines** is not recommended. Urine Drug testing: Patients taking controlled substances will be required to provide a urine sample upon request. Do  not void before coming to your medication management appointments. Hold emptying your bladder until you are admitted. The admitting nurse will inform you if a sample is required. Our practice reserves the right to call you at any time to provide a sample. Once receiving the call, you have 24 hours to comply with request. Not providing a sample upon request may result in termination of medication therapy.  Exceptions: There are only two exceptions to the rule of not receiving pain medications from other Healthcare Providers. Exception #1 (Emergencies): In the event of an emergency (i.e.: accident requiring emergency care), you are allowed to receive additional pain medication. However, you are responsible for: As soon as you are able, call our office [phone], at any time of the day or night, and leave a message stating  your name, the date and nature of the emergency, and the name and dose of the medication prescribed. In the event that your call is answered by a member of our staff, make sure to document and save the date, time, and the name of the person that took your information.  Exception #2 (Planned Surgery): In the event that you are scheduled by another doctor or dentist to have any type of surgery or procedure, you are allowed (for a period no longer than 30 days), to receive additional pain medication, for the acute post-op pain. However, in this case, you are responsible for picking up a copy of our "Post-op Pain Management for Surgeons" handout, and giving it to your surgeon or dentist. This document is available at our office, and does not require an appointment to obtain it. Simply go to our office during business hours (Monday-Thursday from 8:00 AM to 4:00 PM) (Friday 8:00 AM to 12:00 Noon) or if you have a scheduled appointment with [place], prior to your surgery, and ask for it by name. In addition, you are responsible for: calling our office [phone], at any time of the day or night, and  leaving a message stating your name, name of your surgeon, type of surgery, and date of procedure or surgery. Failure to comply with your responsibilities may result in termination of therapy involving the controlled substances.  Consequences:  Non-compliance with the above rules may result in permanent discontinuation of medication prescription therapy. All patients receiving any type of controlled substance is expected to comply with the above patient responsibilities. Not doing so may result in permanent discontinuation of medication prescription therapy. Medication Agreement Violation. Following the above rules, including your responsibilities will help you in avoiding a Medication Agreement Violation ("Breaking your Pain Medication Contract").  *Opioid medications include: morphine, codeine, oxycodone, oxymorphone, hydrocodone, hydromorphone, meperidine, tramadol, tapentadol, buprenorphine, fentanyl, methadone. **Benzodiazepine medications include: diazepam (Valium), alprazolam (Xanax), clonazepam (Klonopine), lorazepam (Ativan), clorazepate (Tranxene), chlordiazepoxide (Librium), estazolam (Prosom), oxazepam (Serax), temazepam (Restoril), triazolam (Halcion) (Last updated: 06/06/2023) ______________________________________________________________________      ______________________________________________________________________    Medication Recommendations and Reminders  Applies to: All patients receiving prescriptions (written and/or electronic).  Medication Rules & Regulations: You are responsible for reading, knowing, and following our "Medication Rules" document. These exist for your safety and that of others. They are not flexible and neither are we. Dismissing or ignoring them is an act of "non-compliance" that may result in complete and irreversible termination of such medication therapy. For safety reasons, "non-compliance" will not be tolerated. As with the [place] fundamental legal  principle of "ignorance of the law is no defense", we will accept no excuses for not having read and knowing the content of documents provided to you by our practice.  Pharmacy of record:  Definition: This is the pharmacy where your electronic prescriptions will be sent.  We do not endorse any particular pharmacy. It is up to you and your insurance to decide what pharmacy to use.  We do not restrict you in your choice of pharmacy. However, once we write for your prescriptions, we will NOT be re-sending more prescriptions to fix restricted supply problems created by your pharmacy, or your insurance.  The pharmacy listed in the electronic medical record should be the one where you want electronic prescriptions to be sent. If you choose to change pharmacy, simply notify our nursing staff. Changes will be made only during your regular appointments and not over the phone.  Recommendations: Keep all of  your pain medications in a safe place, under lock and key, even if you live alone. We will NOT replace lost, stolen, or damaged medication. We do not accept "Police Reports" as proof of medications having been stolen. After you fill your prescription, take 1 week's worth of pills and put them away in a safe place. You should keep a separate, properly labeled bottle for this purpose. The remainder should be kept in the original bottle. Use this as your primary supply, until it runs out. Once it's gone, then you know that you have 1 week's worth of medicine, and it is time to come in for a prescription refill. If you do this correctly, it is unlikely that you will ever run out of medicine. To make sure that the above recommendation works, it is very important that you make sure your medication refill appointments are scheduled at least 1 week before you run out of medicine. To do this in an effective manner, make sure that you do not leave the office without scheduling your next medication management appointment.  Always ask the nursing staff to show you in your prescription , when your medication will be running out. Then arrange for the receptionist to get you a return appointment, at least 7 days before you run out of medicine. Do not wait until you have 1 or 2 pills left, to come in. This is very poor planning and does not take into consideration that we may need to cancel appointments due to bad weather, sickness, or emergencies affecting our staff. DO NOT ACCEPT A "Partial Fill": If for any reason your pharmacy does not have enough pills/tablets to completely fill or refill your prescription, do not allow for a "partial fill". The law allows the pharmacy to complete that prescription within 72 hours, without requiring a new prescription. If they do not fill the rest of your prescription within those 72 hours, you will need a separate prescription to fill the remaining amount, which we will NOT provide. If the reason for the partial fill is your insurance, you will need to talk to the pharmacist about payment alternatives for the remaining tablets, but again, DO NOT ACCEPT A PARTIAL FILL, unless you can trust your pharmacist to obtain the remainder of the pills within 72 hours.  Prescription refills and/or changes in medication(s):  Prescription refills, and/or changes in dose or medication, will be conducted only during scheduled medication management appointments. (Applies to both, written and electronic prescriptions.) No refills on procedure days. No medication will be changed or started on procedure days. No changes, adjustments, and/or refills will be conducted on a procedure day. Doing so will interfere with the diagnostic portion of the procedure. No phone refills. No medications will be "called into the pharmacy". No Fax refills. No weekend refills. No Holliday refills. No after hours refills.  Remember:  Business hours are:  Monday to Thursday 8:00 AM to 4:00 PM Provider's Schedule: Delano Metz, MD - Appointments are:  Medication management: Monday and Wednesday 8:00 AM to 4:00 PM Procedure day: Tuesday and Thursday 7:30 AM to 4:00 PM Edward Jolly, MD - Appointments are:  Medication management: Tuesday and Thursday 8:00 AM to 4:00 PM Procedure day: Monday and Wednesday 7:30 AM to 4:00 PM (Last update: 06/06/2022) ______________________________________________________________________

## 2023-11-27 ENCOUNTER — Other Ambulatory Visit: Payer: Self-pay | Admitting: Family Medicine

## 2023-11-27 NOTE — Telephone Encounter (Signed)
 Last filled on 10/31/23 # 3 mL/ 0 refills   F/u scheduled on 01/03/24

## 2023-12-01 ENCOUNTER — Other Ambulatory Visit: Payer: Self-pay | Admitting: Family Medicine

## 2023-12-07 ENCOUNTER — Ambulatory Visit (INDEPENDENT_AMBULATORY_CARE_PROVIDER_SITE_OTHER): Admitting: Internal Medicine

## 2023-12-07 ENCOUNTER — Encounter: Payer: Self-pay | Admitting: Internal Medicine

## 2023-12-07 VITALS — BP 110/82 | HR 76 | Temp 98.5°F | Ht 63.0 in | Wt 219.0 lb

## 2023-12-07 DIAGNOSIS — J0100 Acute maxillary sinusitis, unspecified: Secondary | ICD-10-CM | POA: Diagnosis not present

## 2023-12-07 MED ORDER — AMOXICILLIN-POT CLAVULANATE 875-125 MG PO TABS: 1.0000 | ORAL_TABLET | Freq: Two times a day (BID) | ORAL | 0 refills | Status: DC

## 2023-12-07 NOTE — Assessment & Plan Note (Signed)
 Seems to have complicated her seasonal allergy symptoms Continue allergy regimen Okay to try tylenol  Augmentin  875 bid x 7 days

## 2023-12-07 NOTE — Progress Notes (Signed)
 Subjective:    Patient ID: Teresa Mcgee, female    DOB: 1965/08/21, 58 y.o.   MRN: [id number]  HPI Here due to respiratory illness  Has pressure/congestion in maxillary area Sneezing Might be allergies---but now bad in the last 3-4 days Feels bad now Some cough---bad tasting stuff (occ can spit it out) Having lots of post nasal drip No fever No SOB Slight right ear pain  Using allergy meds--xyzal   Current Outpatient Medications on File Prior to Visit  Medication Sig Dispense Refill   Accu-Chek FastClix Lancets MISC USE UP TO 4 TIMES DAILY AS DIRECTED 102 each 1   ACCU-CHEK GUIDE test strip USE UP TO 4 TIMES DAILY AS DIRECTED 100 strip 5   albuterol  (VENTOLIN  HFA) 108 (90 Base) MCG/ACT inhaler INHALE 2 PUFFS INTO THE LUNGS EVERY 4 HOURS AS NEEDED FOR (COUGH, SHORTNESS OF BREATH OR WHEEZING) 8.5 each 2   amLODipine  (NORVASC ) 10 MG tablet TAKE 1 TABLET BY MOUTH EVERY DAY 90 tablet 2   azelastine  (OPTIVAR ) 0.05 % ophthalmic solution Place 1 drop into both eyes 2 (two) times daily as needed. 6 mL 3   blood glucose meter kit and supplies Dispense based on patient and insurance preference. Use up to four times daily as directed. (FOR ICD-10 E10.9, E11.9). 1 each 0   [employer] D3 125 MCG (5000 UT) capsule TAKE 1 CAPSULE BY MOUTH DAILY WITH BREAKFAST. TAKE ALONG WITH CALCIUM  AND MAGNESIUM .  5   ELDERBERRY PO Take by mouth.     Evolocumab  (REPATHA  SURECLICK) 140 MG/ML SOAJ Inject 140 mg into the skin every 14 (fourteen) days. 6 mL 3   ezetimibe  (ZETIA ) 10 MG tablet TAKE 1 TABLET BY MOUTH EVERY DAY 90 tablet 2   fluticasone  (FLONASE ) 50 MCG/ACT nasal spray SPRAY 2 SPRAYS INTO EACH NOSTRIL EVERY DAY 48 mL 1   gabapentin  (NEURONTIN ) 100 MG capsule TAKE 1 CAPSULE (100 MG TOTAL) BY MOUTH 2 (TWO) TIMES DAILY AND 3 CAPSULES (300 MG TOTAL) AT BEDTIME. 150 capsule 3   Glucose Blood (BLOOD GLUCOSE TEST STRIPS) STRP 1 each by In Vitro route daily. May substitute to any manufacturer covered by patient's  insurance. 100 strip 0   hydrochlorothiazide  (HYDRODIURIL ) 25 MG tablet TAKE 1 TABLET (25 MG TOTAL) BY MOUTH DAILY. AS DIRECTED 90 tablet 1   levocetirizine (XYZAL ) 5 MG tablet TAKE 1 TABLET BY MOUTH EVERY DAY IN THE EVENING 90 tablet 1   levothyroxine  (SYNTHROID ) 112 MCG tablet TAKE 1 TABLET BY MOUTH DAILY BEFORE BREAKFAST. 90 tablet 2   Magnesium  Oxide -Mg Supplement 500 MG CAPS TAKE 1 CAPSULE BY MOUTH TWICE A DAY (AT 8AM & 10PM) 180 capsule 0   meloxicam  (MOBIC ) 15 MG tablet Take one pill by mouth with food every other day as needed for pain 45 tablet 1   metFORMIN  (GLUCOPHAGE -XR) 500 MG 24 hr tablet TAKE 1 TABLET BY MOUTH 2 TIMES DAILY WITH A MEAL. 180 tablet 0   naloxone  (NARCAN ) nasal spray 4 mg/0.1 mL Place 1 spray into the nose as needed for up to 365 doses (for opioid-induced respiratory depresssion). In case of emergency (overdose), spray once into each nostril. If no response within 3 minutes, repeat application and call 911. 1 each 0   Olopatadine HCl 0.2 % SOLN INSTILL 1 DROP INTO BOTH EYES EVERY DAY AS NEEDED     oxyCODONE  (OXY IR/ROXICODONE ) 5 MG immediate release tablet Take 1 tablet (5 mg total) by mouth every 8 (eight) hours as needed for severe  pain (pain score 7-10). Must last 30 days 90 tablet 0   [START ON 12/25/2023] oxyCODONE  (OXY IR/ROXICODONE ) 5 MG immediate release tablet Take 1 tablet (5 mg total) by mouth every 8 (eight) hours as needed for severe pain (pain score 7-10). Must last 30 days 90 tablet 0   [START ON 01/24/2024] oxyCODONE  (OXY IR/ROXICODONE ) 5 MG immediate release tablet Take 1 tablet (5 mg total) by mouth every 8 (eight) hours as needed for severe pain (pain score 7-10). Must last 30 days 90 tablet 0   pantoprazole  (PROTONIX ) 40 MG tablet TAKE 1 TABLET BY MOUTH EVERY DAY 90 tablet 0   potassium chloride  (KLOR-CON ) 10 MEQ tablet Take 2 tablets (20 mEq total) by mouth daily. 180 tablet 1   REFRESH CELLUVISC 1 % GEL APPLY 1 DROP TO EYE 3 (THREE) TIMES DAILY.  12    Semaglutide , 2 MG/DOSE, (OZEMPIC , 2 MG/DOSE,) 8 MG/3ML SOPN INJECT 2 MG AS DIRECTED ONCE A WEEK. 3 mL 2   tiZANidine  (ZANAFLEX ) 4 MG tablet TAKE 1 TABLET (4 MG TOTAL) BY MOUTH EVERY 8 (EIGHT) HOURS AS NEEDED FOR MUSCLE SPASMS 270 tablet 1   No current facility-administered medications on file prior to visit.    Allergies  Allergen Reactions   Statins     myopathy    Past Medical History:  Diagnosis Date   Arthritis    Bell's palsy may 2012   Chest pain    Diabetes mellitus without complication (HCC)    GERD (gastroesophageal reflux disease)    H/O hiatal hernia    Hyperlipidemia    Hypertension    Hypothyroidism    Shingles    Shortness of breath    Sleep apnea    uses cpap    Past Surgical History:  Procedure Laterality Date   ABDOMINAL HYSTERECTOMY     CESAREAN SECTION     cesarian     3x   COLONOSCOPY WITH PROPOFOL  N/A 11/27/2019   Procedure: COLONOSCOPY WITH PROPOFOL ;  Surgeon: Luke Salaam, MD;  Location: [facility] ENDOSCOPY;  Service: Gastroenterology;  Laterality: N/A;   ESOPHAGOGASTRODUODENOSCOPY (EGD) WITH PROPOFOL  N/A 07/20/2020   Procedure: ESOPHAGOGASTRODUODENOSCOPY (EGD) WITH PROPOFOL ;  Surgeon: Luke Salaam, MD;  Location: [facility] ENDOSCOPY;  Service: Gastroenterology;  Laterality: N/A;   PARTIAL HYSTERECTOMY  2000   abnormal uterine bleeding   THYROIDECTOMY  1998   TOOTH EXTRACTION     TUBAL LIGATION      Family History  Problem Relation Age of Onset   Heart attack Father 5       deceased   Diabetes Brother    Stroke Brother 25       twice   Stroke Sister 45   Diabetes Sister    Heart attack Sister 31   Heart attack Brother 41   Breast cancer Maternal Aunt     Social History   Socioeconomic History   Marital status: Married    Spouse name: Not on file   Number of children: Not on file   Years of education: Not on file   Highest education level: Not on file  Occupational History   Occupation: disabled    Employer: [employer]    Comment: back   Tobacco Use   Smoking status: Every Day    Current packs/day: 0.50    Average packs/day: 0.5 packs/day for 20.0 years (10.0 ttl pk-yrs)    Types: Cigarettes   Smokeless tobacco: Never   Tobacco comments:    11/13/2023 Patient smokes 1/2 pack cigarettes daily  2-3 cigs daily-09/22/2020  Vaping Use   Vaping status: Never Used  Substance and Sexual Activity   Alcohol use: No   Drug use: No   Sexual activity: Not Currently  Other Topics Concern   Not on file  Social History Narrative   Married but she and husband separated, she helps take care of him (he is in poor health)   She lives with her grown son who is mentally disabled   She has been on disability since 2011   Social Drivers of Health   Financial Resource Strain: Low Risk  (09/07/2023)   Overall Financial Resource Strain (CARDIA)    Difficulty of Paying Living Expenses: Not hard at all  Recent Concern: Financial Resource Strain - Medium Risk (08/02/2023)   Received from [facility]   Overall Financial Resource Strain (CARDIA)    Difficulty of Paying Living Expenses: Somewhat hard  Food Insecurity: No Food Insecurity (09/07/2023)   Hunger Vital Sign    Worried About Running Out of Food in the Last Year: Never true    Ran Out of Food in the Last Year: Never true  Recent Concern: Food Insecurity - Food Insecurity Present (08/02/2023)   Received from [facility]   Hunger Vital Sign    Worried About Running Out of Food in the Last Year: Sometimes true    Ran Out of Food in the Last Year: Sometimes true  Transportation Needs: No Transportation Needs (09/07/2023)   PRAPARE - Administrator, Civil Service (Medical): No    Lack of Transportation (Non-Medical): No  Physical Activity: Inactive (09/07/2023)   Exercise Vital Sign    Days of Exercise per Week: 0 days    Minutes of Exercise per Session: 0 min  Stress: No Stress Concern Present (09/07/2023)   [employer] of  Occupational Health - Occupational Stress Questionnaire    Feeling of Stress : Only a little  Social Connections: Moderately Integrated (09/07/2023)   Social Connection and Isolation Panel [NHANES]    Frequency of Communication with Friends and Family: More than three times a week    Frequency of Social Gatherings with Friends and Family: Twice a week    Attends Religious Services: More than 4 times per year    Active Member of [employer] or Organizations: No    Attends Banker Meetings: Never    Marital Status: Married  Catering manager Violence: Not At Risk (09/07/2023)   Humiliation, Afraid, Rape, and Kick questionnaire    Fear of Current or Ex-Partner: No    Emotionally Abused: No    Physically Abused: No    Sexually Abused: No   Review of Systems No loss of smell--but taste is off No N/V Eating okay     Objective:   Physical Exam Constitutional:      Appearance: Normal appearance.  HENT:     Head:     Comments: Maxillary tenderness    Right Ear: Tympanic membrane and ear canal normal.     Left Ear: Tympanic membrane and ear canal normal.     Mouth/Throat:     Pharynx: No oropharyngeal exudate or posterior oropharyngeal erythema.  Pulmonary:     Effort: Pulmonary effort is normal.     Breath sounds: Normal breath sounds. No wheezing or rales.  Musculoskeletal:     Cervical back: Neck supple.  Lymphadenopathy:     Cervical: No cervical adenopathy.  Neurological:     Mental Status: She is alert.  Assessment & Plan:

## 2023-12-10 ENCOUNTER — Ambulatory Visit: Payer: Self-pay

## 2023-12-10 ENCOUNTER — Ambulatory Visit (INDEPENDENT_AMBULATORY_CARE_PROVIDER_SITE_OTHER): Admitting: Internal Medicine

## 2023-12-10 ENCOUNTER — Encounter: Payer: Self-pay | Admitting: Internal Medicine

## 2023-12-10 VITALS — BP 118/82 | HR 73 | Temp 98.0°F | Ht 63.0 in | Wt 217.0 lb

## 2023-12-10 DIAGNOSIS — J0100 Acute maxillary sinusitis, unspecified: Secondary | ICD-10-CM

## 2023-12-10 MED ORDER — PREDNISONE 20 MG PO TABS
40.0000 mg | ORAL_TABLET | Freq: Every day | ORAL | 1 refills | Status: DC
Start: 2023-12-10 — End: 2024-01-03

## 2023-12-10 MED ORDER — DOXYCYCLINE HYCLATE 100 MG PO TABS
100.0000 mg | ORAL_TABLET | Freq: Two times a day (BID) | ORAL | 0 refills | Status: DC
Start: 2023-12-10 — End: 2024-01-03

## 2023-12-10 NOTE — Assessment & Plan Note (Signed)
 Now more frontal Intolerant of the augmentin  Trouble using her CPAP due to the congestion Will give prednisone  40 x 3, 20 x 3 Change antibiotic to doxycycline  100 bid x 7 days

## 2023-12-10 NOTE — Progress Notes (Signed)
 Subjective:    Patient ID: Teresa Mcgee, female    DOB: [birth year], 58 y.o.   MRN: [id number]  HPI Here due to ongoing respiratory symptoms  This morning when waking on CPAP--felt "like I was suffocating" All stopped up Prednisone  has helped with similar symptoms in the past Diarrhea with the augmentin  No fever Some cough Not really SOB during the day  Current Outpatient Medications on File Prior to Visit  Medication Sig Dispense Refill   Accu-Chek FastClix Lancets MISC USE UP TO 4 TIMES DAILY AS DIRECTED 102 each 1   ACCU-CHEK GUIDE test strip USE UP TO 4 TIMES DAILY AS DIRECTED 100 strip 5   albuterol  (VENTOLIN  HFA) 108 (90 Base) MCG/ACT inhaler INHALE 2 PUFFS INTO THE LUNGS EVERY 4 HOURS AS NEEDED FOR (COUGH, SHORTNESS OF BREATH OR WHEEZING) 8.5 each 2   amLODipine  (NORVASC ) 10 MG tablet TAKE 1 TABLET BY MOUTH EVERY DAY 90 tablet 2   amoxicillin -clavulanate (AUGMENTIN ) 875-125 MG tablet Take 1 tablet by mouth 2 (two) times daily. 14 tablet 0   azelastine  (OPTIVAR ) 0.05 % ophthalmic solution Place 1 drop into both eyes 2 (two) times daily as needed. 6 mL 3   blood glucose meter kit and supplies Dispense based on patient and insurance preference. Use up to four times daily as directed. (FOR ICD-10 E10.9, E11.9). 1 each 0   [employer]S D3 125 MCG (5000 UT) capsule TAKE 1 CAPSULE BY MOUTH DAILY WITH BREAKFAST. TAKE ALONG WITH CALCIUM  AND MAGNESIUM .  5   ELDERBERRY PO Take by mouth.     Evolocumab  (REPATHA  SURECLICK) 140 MG/ML SOAJ Inject 140 mg into the skin every 14 (fourteen) days. 6 mL 3   ezetimibe  (ZETIA ) 10 MG tablet TAKE 1 TABLET BY MOUTH EVERY DAY 90 tablet 2   fluticasone  (FLONASE ) 50 MCG/ACT nasal spray SPRAY 2 SPRAYS INTO EACH NOSTRIL EVERY DAY 48 mL 1   gabapentin  (NEURONTIN ) 100 MG capsule TAKE 1 CAPSULE (100 MG TOTAL) BY MOUTH 2 (TWO) TIMES DAILY AND 3 CAPSULES (300 MG TOTAL) AT BEDTIME. 150 capsule 3   Glucose Blood (BLOOD GLUCOSE TEST STRIPS) STRP 1 each by In Vitro route  daily. May substitute to any manufacturer covered by patient's insurance. 100 strip 0   hydrochlorothiazide  (HYDRODIURIL ) 25 MG tablet TAKE 1 TABLET (25 MG TOTAL) BY MOUTH DAILY. AS DIRECTED 90 tablet 1   levocetirizine (XYZAL ) 5 MG tablet TAKE 1 TABLET BY MOUTH EVERY DAY IN THE EVENING 90 tablet 1   levothyroxine  (SYNTHROID ) 112 MCG tablet TAKE 1 TABLET BY MOUTH DAILY BEFORE BREAKFAST. 90 tablet 2   Magnesium  Oxide -Mg Supplement 500 MG CAPS TAKE 1 CAPSULE BY MOUTH TWICE A DAY (AT 8AM & 10PM) 180 capsule 0   meloxicam  (MOBIC ) 15 MG tablet Take one pill by mouth with food every other day as needed for pain 45 tablet 1   metFORMIN  (GLUCOPHAGE -XR) 500 MG 24 hr tablet TAKE 1 TABLET BY MOUTH 2 TIMES DAILY WITH A MEAL. 180 tablet 0   naloxone  (NARCAN ) nasal spray 4 mg/0.1 mL Place 1 spray into the nose as needed for up to 365 doses (for opioid-induced respiratory depresssion). In case of emergency (overdose), spray once into each nostril. If no response within 3 minutes, repeat application and call 911. 1 each 0   Olopatadine HCl 0.2 % SOLN INSTILL 1 DROP INTO BOTH EYES EVERY DAY AS NEEDED     oxyCODONE  (OXY IR/ROXICODONE ) 5 MG immediate release tablet Take 1 tablet (5 mg total)  by mouth every 8 (eight) hours as needed for severe pain (pain score 7-10). Must last 30 days 90 tablet 0   [START ON 12/25/2023] oxyCODONE  (OXY IR/ROXICODONE ) 5 MG immediate release tablet Take 1 tablet (5 mg total) by mouth every 8 (eight) hours as needed for severe pain (pain score 7-10). Must last 30 days 90 tablet 0   [START ON 01/24/2024] oxyCODONE  (OXY IR/ROXICODONE ) 5 MG immediate release tablet Take 1 tablet (5 mg total) by mouth every 8 (eight) hours as needed for severe pain (pain score 7-10). Must last 30 days 90 tablet 0   pantoprazole  (PROTONIX ) 40 MG tablet TAKE 1 TABLET BY MOUTH EVERY DAY 90 tablet 0   potassium chloride  (KLOR-CON ) 10 MEQ tablet Take 2 tablets (20 mEq total) by mouth daily. 180 tablet 1   REFRESH  CELLUVISC 1 % GEL APPLY 1 DROP TO EYE 3 (THREE) TIMES DAILY.  12   Semaglutide , 2 MG/DOSE, (OZEMPIC , 2 MG/DOSE,) 8 MG/3ML SOPN INJECT 2 MG AS DIRECTED ONCE A WEEK. 3 mL 2   tiZANidine  (ZANAFLEX ) 4 MG tablet TAKE 1 TABLET (4 MG TOTAL) BY MOUTH EVERY 8 (EIGHT) HOURS AS NEEDED FOR MUSCLE SPASMS 270 tablet 1   No current facility-administered medications on file prior to visit.    Allergies  Allergen Reactions   Statins     myopathy    Past Medical History:  Diagnosis Date   Arthritis    Bell's palsy may 2012   Chest pain    Diabetes mellitus without complication (HCC)    GERD (gastroesophageal reflux disease)    H/O hiatal hernia    Hyperlipidemia    Hypertension    Hypothyroidism    Shingles    Shortness of breath    Sleep apnea    uses cpap    Past Surgical History:  Procedure Laterality Date   ABDOMINAL HYSTERECTOMY     CESAREAN SECTION     cesarian     3x   COLONOSCOPY WITH PROPOFOL  N/A 11/27/2019   Procedure: COLONOSCOPY WITH PROPOFOL ;  Surgeon: Luke Salaam, MD;  Location: [facility] ENDOSCOPY;  Service: Gastroenterology;  Laterality: N/A;   ESOPHAGOGASTRODUODENOSCOPY (EGD) WITH PROPOFOL  N/A 07/20/2020   Procedure: ESOPHAGOGASTRODUODENOSCOPY (EGD) WITH PROPOFOL ;  Surgeon: Luke Salaam, MD;  Location: [facility] ENDOSCOPY;  Service: Gastroenterology;  Laterality: N/A;   PARTIAL HYSTERECTOMY  2000   abnormal uterine bleeding   THYROIDECTOMY  1998   TOOTH EXTRACTION     TUBAL LIGATION      Family History  Problem Relation Age of Onset   Heart attack Father 8       deceased   Diabetes Brother    Stroke Brother 25       twice   Stroke Sister 45   Diabetes Sister    Heart attack Sister 36   Heart attack Brother 55   Breast cancer Maternal Aunt     Social History   Socioeconomic History   Marital status: Married    Spouse name: Not on file   Number of children: Not on file   Years of education: Not on file   Highest education level: Not on file  Occupational History    Occupation: disabled    Employer: [employer]    Comment: back  Tobacco Use   Smoking status: Every Day    Current packs/day: 0.50    Average packs/day: 0.5 packs/day for 20.0 years (10.0 ttl pk-yrs)    Types: Cigarettes   Smokeless tobacco: Never   Tobacco comments:  11/13/2023 Patient smokes 1/2 pack cigarettes daily    2-3 cigs daily-09/22/2020  Vaping Use   Vaping status: Never Used  Substance and Sexual Activity   Alcohol use: No   Drug use: No   Sexual activity: Not Currently  Other Topics Concern   Not on file  Social History Narrative   Married but she and husband separated, she helps take care of him (he is in poor health)   She lives with her grown son who is mentally disabled   She has been on disability since 2011   Social Drivers of Health   Financial Resource Strain: Low Risk  (09/07/2023)   Overall Financial Resource Strain (CARDIA)    Difficulty of Paying Living Expenses: Not hard at all  Recent Concern: Financial Resource Strain - Medium Risk (08/02/2023)   Received from [facility]   Overall Financial Resource Strain (CARDIA)    Difficulty of Paying Living Expenses: Somewhat hard  Food Insecurity: No Food Insecurity (09/07/2023)   Hunger Vital Sign    Worried About Running Out of Food in the Last Year: Never true    Ran Out of Food in the Last Year: Never true  Recent Concern: Food Insecurity - Food Insecurity Present (08/02/2023)   Received from [facility]   Hunger Vital Sign    Worried About Running Out of Food in the Last Year: Sometimes true    Ran Out of Food in the Last Year: Sometimes true  Transportation Needs: No Transportation Needs (09/07/2023)   PRAPARE - Administrator, Civil Service (Medical): No    Lack of Transportation (Non-Medical): No  Physical Activity: Inactive (09/07/2023)   Exercise Vital Sign    Days of Exercise per Week: 0 days    Minutes of Exercise per Session: 0 min   Stress: No Stress Concern Present (09/07/2023)   [employer] of Occupational Health - Occupational Stress Questionnaire    Feeling of Stress : Only a little  Social Connections: Moderately Integrated (09/07/2023)   Social Connection and Isolation Panel [NHANES]    Frequency of Communication with Friends and Family: More than three times a week    Frequency of Social Gatherings with Friends and Family: Twice a week    Attends Religious Services: More than 4 times per year    Active Member of [employer] or Organizations: No    Attends Banker Meetings: Never    Marital Status: Married  Catering manager Violence: Not At Risk (09/07/2023)   Humiliation, Afraid, Rape, and Kick questionnaire    Fear of Current or Ex-Partner: No    Emotionally Abused: No    Physically Abused: No    Sexually Abused: No   Review of Systems Some nausea--no vomiting Eating okay---but not appetite on ozempic      Objective:   Physical Exam Constitutional:      Appearance: Normal appearance.  HENT:     Head:     Comments: Mild frontal tenderness now    Right Ear: Tympanic membrane and ear canal normal.     Left Ear: Tympanic membrane and ear canal normal.     Nose:     Comments: Moderate nasal inflammation    Mouth/Throat:     Pharynx: No oropharyngeal exudate or posterior oropharyngeal erythema.  Pulmonary:     Effort: Pulmonary effort is normal.     Breath sounds: Normal breath sounds. No wheezing or rales.  Musculoskeletal:     Cervical back: Neck  supple.  Lymphadenopathy:     Cervical: No cervical adenopathy.  Neurological:     Mental Status: She is alert.            Assessment & Plan:

## 2023-12-10 NOTE — Telephone Encounter (Signed)
Seen in office this morning.

## 2023-12-10 NOTE — Telephone Encounter (Signed)
 Reason for Triage: diarrhea,possibly from ozempic    Chief Complaint: cough Symptoms: worsening  cough, wheezing Frequency: Saturday Pertinent Negatives: Patient denies fever Disposition: [] ED /[] Urgent Care (no appt availability in office) / [x] Appointment(In office/virtual)/ []  [facility]/ [] Home Care/ [] Refused Recommended Disposition /[] [facility]/ []  Follow-up with PCP Additional Notes: went to PCP office on Friday, placed on ABX & had a few loose stools since. Pt thinks may have bronchitis - feels worse than on Friday: wheezing and voice going away.    Reason for Disposition  [1] MILD difficulty breathing (e.g., minimal/no SOB at rest, SOB with walking, pulse <100) AND [2] still present when not coughing  Answer Assessment - Initial Assessment Questions 1. ONSET: "When did the cough begin?"      Saturday 2. SEVERITY: "How bad is the cough today?"      Moderate  3. SPUTUM: "Describe the color of your sputum" (none, dry cough; clear, white, yellow, green)     clear 4. HEMOPTYSIS: "Are you coughing up any blood?" If so ask: "How much?" (flecks, streaks, tablespoons, etc.)     no 5. DIFFICULTY BREATHING: "Are you having difficulty breathing?" If Yes, ask: "How bad is it?" (e.g., mild, moderate, severe)    - MILD: No SOB at rest, mild SOB with walking, speaks normally in sentences, can lie down, no retractions, pulse < 100.    - MODERATE: SOB at rest, SOB with minimal exertion and prefers to sit, cannot lie down flat, speaks in phrases, mild retractions, audible wheezing, pulse 100-120.    - SEVERE: Very SOB at rest, speaks in single words, struggling to breathe, sitting hunched forward, retractions, pulse > 120      no 6. FEVER: "Do you have a fever?" If Yes, ask: "What is your temperature, how was it measured, and when did it start?"     no 7. CARDIAC HISTORY: "Do you have any history of heart disease?" (e.g., heart attack, congestive heart failure)       N/a 8. LUNG HISTORY: "Do you have any history of lung disease?"  (e.g., pulmonary embolus, asthma, emphysema)     N/a 9. PE RISK FACTORS: "Do you have a history of blood clots?" (or: recent major surgery, recent prolonged travel, bedridden)     N/a 10. OTHER SYMPTOMS: "Do you have any other symptoms?" (e.g., runny nose, wheezing, chest pain)       Wheezing, cough,  11. PREGNANCY: "Is there any chance you are pregnant?" "When was your last menstrual period?"       N/a 12. TRAVEL: "Have you traveled out of the country in the last month?" (e.g., travel history, exposures)       N/a  Protocols used: Cough - Acute Productive-A-AH

## 2023-12-22 ENCOUNTER — Other Ambulatory Visit: Payer: Self-pay | Admitting: Family Medicine

## 2023-12-22 ENCOUNTER — Other Ambulatory Visit: Payer: Self-pay | Admitting: Internal Medicine

## 2023-12-22 DIAGNOSIS — M150 Primary generalized (osteo)arthritis: Secondary | ICD-10-CM

## 2023-12-24 NOTE — Telephone Encounter (Signed)
 F/u scheduled on 01/03/24  Last filled on 06/25/23 #45 tabs/ 1 refill

## 2024-01-03 ENCOUNTER — Encounter: Payer: Self-pay | Admitting: Family Medicine

## 2024-01-03 ENCOUNTER — Ambulatory Visit: Payer: Self-pay | Admitting: Family Medicine

## 2024-01-03 ENCOUNTER — Ambulatory Visit (INDEPENDENT_AMBULATORY_CARE_PROVIDER_SITE_OTHER): Admitting: Family Medicine

## 2024-01-03 VITALS — BP 122/70 | HR 75 | Temp 98.1°F | Ht 63.0 in | Wt 217.4 lb

## 2024-01-03 DIAGNOSIS — E785 Hyperlipidemia, unspecified: Secondary | ICD-10-CM

## 2024-01-03 DIAGNOSIS — Z7985 Long-term (current) use of injectable non-insulin antidiabetic drugs: Secondary | ICD-10-CM

## 2024-01-03 DIAGNOSIS — E039 Hypothyroidism, unspecified: Secondary | ICD-10-CM | POA: Diagnosis not present

## 2024-01-03 DIAGNOSIS — E1169 Type 2 diabetes mellitus with other specified complication: Secondary | ICD-10-CM | POA: Diagnosis not present

## 2024-01-03 DIAGNOSIS — Z1231 Encounter for screening mammogram for malignant neoplasm of breast: Secondary | ICD-10-CM

## 2024-01-03 DIAGNOSIS — I10 Essential (primary) hypertension: Secondary | ICD-10-CM

## 2024-01-03 DIAGNOSIS — E119 Type 2 diabetes mellitus without complications: Secondary | ICD-10-CM | POA: Diagnosis not present

## 2024-01-03 DIAGNOSIS — Z720 Tobacco use: Secondary | ICD-10-CM

## 2024-01-03 LAB — POCT GLYCOSYLATED HEMOGLOBIN (HGB A1C): Hemoglobin A1C: 5.8 % — AB (ref 4.0–5.6)

## 2024-01-03 LAB — MICROALBUMIN / CREATININE URINE RATIO
Creatinine,U: 143.6 mg/dL
Microalb Creat Ratio: 8 mg/g (ref 0.0–30.0)
Microalb, Ur: 1.1 mg/dL (ref 0.0–1.9)

## 2024-01-03 NOTE — Progress Notes (Signed)
 Subjective:    Patient ID: Teresa Mcgee, female    DOB: 04/21/1966, 58 y.o.   MRN: [id number]  HPI  Wt Readings from Last 3 Encounters:  01/03/24 217 lb 6 oz (98.6 kg)  12/10/23 217 lb (98.4 kg)  12/07/23 219 lb (99.3 kg)   38.51 kg/m  Vitals:   01/03/24 1127  BP: 122/70  Pulse: 75  Temp: 98.1 F (36.7 C)  SpO2: 99%   'wt is down from 276 lb  Pleased   Pt presents for follow up of DM2, HTN, hyperlipidemia and chronic health problems    HTN bp is stable today  No cp or palpitations or headaches or edema  No side effects to medicines  BP Readings from Last 3 Encounters:  01/03/24 122/70  12/10/23 118/82  12/07/23 110/82    Hydrochlorothiazide  25 mg daily  Amlodipine  10 mg daily   Pulse Readings from Last 3 Encounters:  01/03/24 75  12/10/23 73  12/07/23 76    Lab Results  Component Value Date   NA 141 09/24/2023   K 3.6 09/24/2023   CO2 31 09/24/2023   GLUCOSE 104 (H) 09/24/2023   BUN 5 (L) 09/24/2023   CREATININE 0.91 09/24/2023   CALCIUM  9.7 09/24/2023   GFR 70.21 09/24/2023   GFRNONAA >60 12/29/2022   DM2 Lab Results  Component Value Date   HGBA1C 5.8 (A) 01/03/2024   HGBA1C 6.4 (A) 09/10/2023   HGBA1C 6.9 (A) 05/28/2023  Down to 5.8 today   Metformin  xr 500 mg bid  Semaglutide  2 mg weekly - occational diarrhea   Occational gets low sugars -as low as 77  Weight is down to 271  Diet-eating healthy / avoiding added sugar  Exercise - very active    Eye exam utd dec   Microalb due   Hyperlipidemia Lab Results  Component Value Date   CHOL 175 09/10/2023   HDL 74.40 09/10/2023   LDLCALC 61 09/10/2023   LDLDIRECT 103.0 12/01/2022   TRIG 196.0 (H) 09/10/2023   CHOLHDL 2 09/10/2023   Repatha  Zetia  10 mg daily   Hypothyroidism  Pt has no clinical changes No change in energy level/ hair or skin/ edema and no tremor Lab Results  Component Value Date   TSH 3.59 09/24/2023    Levothyroxine  112 mcg daily   Smoking status   2-3 cig per day at most  Not ready to quit yet     Patient Active Problem List   Diagnosis Date Noted   Elevated antinuclear antibody (ANA) level 05/28/2023   Morbid obesity (HCC) 05/28/2023   Elevated CK 05/28/2023   Right shoulder pain 03/15/2023   Hypomagnesemia 02/21/2022   Hypokalemia 02/21/2022   Fatty liver 02/06/2022   Allergic conjunctivitis 12/01/2021   Elevated LFTs 11/22/2021   Medication monitoring encounter 11/22/2021   Fungal dermatitis 11/22/2021   Routine general medical examination at a health care facility 08/19/2021   Encounter for screening mammogram for breast cancer 08/19/2021   Chronic use of opiate for therapeutic purpose 11/04/2020   Uncomplicated opioid dependence (HCC) 11/04/2020   Ragged cuticle 09/23/2020   Diabetes mellitus treated with injections of non-insulin medication (HCC) 03/30/2019   Pain due to onychomycosis of toenails of both feet 03/03/2019   Cervicalgia 04/22/2018   Chronic musculoskeletal pain 04/22/2018   DDD (degenerative disc disease), thoracic 04/22/2018   DDD (degenerative disc disease), cervical 04/22/2018   Lumbar facet arthropathy (Bilateral) 04/22/2018   Lumbar facet syndrome (Bilateral) 04/22/2018   Chronic low back  pain (1ry area of Pain) (Bilateral) (L>R) w/o sciatica 04/22/2018   Spondylosis without myelopathy or radiculopathy, cervical region 04/22/2018   Spondylosis without myelopathy or radiculopathy, lumbosacral region 04/22/2018   DDD (degenerative disc disease), lumbosacral 04/22/2018   Strain of lumbar paraspinal muscle, sequela 04/22/2018   Chronic hip pain (Bilateral) (R>L) 04/22/2018   Chronic sacroiliac joint pain (Left) 04/22/2018   Neurogenic pain 04/22/2018   Osteoarthritis involving multiple joints 04/22/2018   Chronic knee pain (Bilateral) (R>L) 04/22/2018   Chronic upper extremity pain (4th area of Pain) (Bilateral) (L>R) 02/27/2018   Vitamin D  deficiency 02/27/2018   Chronic low back pain  (Bilateral) (L>R) w/ sciatica (Bilateral) 01/24/2018   Chronic lower extremity pain (2ry area of Pain) (Bilateral) (L>R) 01/24/2018   Chronic neck pain (3ry area of Pain) (Bilateral) (L>R) 01/24/2018   Chronic pain syndrome 01/24/2018   Long term current use of opiate analgesic 01/24/2018   Disorder of skeletal system 01/24/2018   Nonallopathic lesion of head region 04/25/2012   Constipation 01/11/2012   Chest pain 01/11/2012   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   Numbness and tingling in both hands 08/25/2011   Muscle spasms of neck 05/23/2011   Fatigue 03/21/2011   Tobacco abuse 05/24/2010   Hypothyroidism 01/25/2010   HYPERTENSION, BENIGN ESSENTIAL 01/25/2010   Hyperlipidemia associated with type 2 diabetes mellitus (HCC) 12/31/2009   DEPRESSION 12/31/2009   GERD 12/31/2009   OSA (obstructive sleep apnea) 12/31/2009   Past Medical History:  Diagnosis Date   Arthritis    Bell's palsy may 2012   Chest pain    Diabetes mellitus without complication (HCC)    GERD (gastroesophageal reflux disease)    H/O hiatal hernia    Hyperlipidemia    Hypertension    Hypothyroidism    Shingles    Shortness of breath    Sleep apnea    uses cpap   Past Surgical History:  Procedure Laterality Date   ABDOMINAL HYSTERECTOMY     CESAREAN SECTION     cesarian     3x   COLONOSCOPY WITH PROPOFOL  N/A 11/27/2019   Procedure: COLONOSCOPY WITH PROPOFOL ;  Surgeon: Luke Salaam, MD;  Location: [facility] ENDOSCOPY;  Service: Gastroenterology;  Laterality: N/A;   ESOPHAGOGASTRODUODENOSCOPY (EGD) WITH PROPOFOL  N/A 07/20/2020   Procedure: ESOPHAGOGASTRODUODENOSCOPY (EGD) WITH PROPOFOL ;  Surgeon: Luke Salaam, MD;  Location: [facility] ENDOSCOPY;  Service: Gastroenterology;  Laterality: N/A;   PARTIAL HYSTERECTOMY  2000   abnormal uterine bleeding   THYROIDECTOMY  1998   TOOTH EXTRACTION     TUBAL LIGATION     Social History   Tobacco Use   Smoking status: Every Day    Current packs/day: 0.50    Average packs/day:  0.5 packs/day for 20.0 years (10.0 ttl pk-yrs)    Types: Cigarettes   Smokeless tobacco: Never   Tobacco comments:    11/13/2023 Patient smokes 1/2 pack cigarettes daily    2-3 cigs daily-09/22/2020  Vaping Use   Vaping status: Never Used  Substance Use Topics   Alcohol use: No   Drug use: No   Family History  Problem Relation Age of Onset   Heart attack Father 60       deceased   Diabetes Brother    Stroke Brother 25       twice   Stroke Sister 45   Diabetes Sister    Heart attack Sister 38   Heart attack Brother 39   Breast cancer Maternal Aunt    Allergies  Allergen Reactions  Statins     myopathy   Current Outpatient Medications on File Prior to Visit  Medication Sig Dispense Refill   Accu-Chek FastClix Lancets MISC USE UP TO 4 TIMES DAILY AS DIRECTED 102 each 1   ACCU-CHEK GUIDE test strip USE UP TO 4 TIMES DAILY AS DIRECTED 100 strip 5   albuterol  (VENTOLIN  HFA) 108 (90 Base) MCG/ACT inhaler INHALE 2 PUFFS INTO THE LUNGS EVERY 4 HOURS AS NEEDED FOR (COUGH, SHORTNESS OF BREATH OR WHEEZING) 8.5 each 2   amLODipine  (NORVASC ) 10 MG tablet TAKE 1 TABLET BY MOUTH EVERY DAY 90 tablet 2   azelastine  (OPTIVAR ) 0.05 % ophthalmic solution Place 1 drop into both eyes 2 (two) times daily as needed. 6 mL 3   blood glucose meter kit and supplies Dispense based on patient and insurance preference. Use up to four times daily as directed. (FOR ICD-10 E10.9, E11.9). 1 each 0   [employer] D3 125 MCG (5000 UT) capsule TAKE 1 CAPSULE BY MOUTH DAILY WITH BREAKFAST. TAKE ALONG WITH CALCIUM  AND MAGNESIUM .  5   ELDERBERRY PO Take by mouth.     Evolocumab  (REPATHA  SURECLICK) 140 MG/ML SOAJ INJECT 140 MG INTO THE SKIN EVERY 14 (FOURTEEN) DAYS. 6 mL 3   ezetimibe  (ZETIA ) 10 MG tablet TAKE 1 TABLET BY MOUTH EVERY DAY 90 tablet 2   fluticasone  (FLONASE ) 50 MCG/ACT nasal spray SPRAY 2 SPRAYS INTO EACH NOSTRIL EVERY DAY 48 mL 1   gabapentin  (NEURONTIN ) 100 MG capsule TAKE 1 CAPSULE (100 MG TOTAL) BY MOUTH  2 (TWO) TIMES DAILY AND 3 CAPSULES (300 MG TOTAL) AT BEDTIME. 150 capsule 3   Glucose Blood (BLOOD GLUCOSE TEST STRIPS) STRP 1 each by In Vitro route daily. May substitute to any manufacturer covered by patient's insurance. 100 strip 0   hydrochlorothiazide  (HYDRODIURIL ) 25 MG tablet TAKE 1 TABLET (25 MG TOTAL) BY MOUTH DAILY. AS DIRECTED 90 tablet 1   levocetirizine (XYZAL ) 5 MG tablet TAKE 1 TABLET BY MOUTH EVERY DAY IN THE EVENING 90 tablet 1   levothyroxine  (SYNTHROID ) 112 MCG tablet TAKE 1 TABLET BY MOUTH DAILY BEFORE BREAKFAST. 90 tablet 2   Magnesium  Oxide -Mg Supplement 500 MG CAPS TAKE 1 CAPSULE BY MOUTH TWICE A DAY (AT 8AM & 10PM) 180 capsule 0   meloxicam  (MOBIC ) 15 MG tablet TAKE ONE PILL BY MOUTH WITH FOOD EVERY OTHER DAY AS NEEDED FOR PAIN 45 tablet 1   metFORMIN  (GLUCOPHAGE -XR) 500 MG 24 hr tablet TAKE 1 TABLET BY MOUTH 2 TIMES DAILY WITH A MEAL. (Patient taking differently: Take 500 mg by mouth daily with breakfast.) 180 tablet 0   naloxone  (NARCAN ) nasal spray 4 mg/0.1 mL Place 1 spray into the nose as needed for up to 365 doses (for opioid-induced respiratory depresssion). In case of emergency (overdose), spray once into each nostril. If no response within 3 minutes, repeat application and call 911. 1 each 0   Olopatadine HCl 0.2 % SOLN INSTILL 1 DROP INTO BOTH EYES EVERY DAY AS NEEDED     oxyCODONE  (OXY IR/ROXICODONE ) 5 MG immediate release tablet Take 1 tablet (5 mg total) by mouth every 8 (eight) hours as needed for severe pain (pain score 7-10). Must last 30 days 90 tablet 0   [START ON 01/24/2024] oxyCODONE  (OXY IR/ROXICODONE ) 5 MG immediate release tablet Take 1 tablet (5 mg total) by mouth every 8 (eight) hours as needed for severe pain (pain score 7-10). Must last 30 days 90 tablet 0   pantoprazole  (PROTONIX ) 40 MG tablet  TAKE 1 TABLET BY MOUTH EVERY DAY 90 tablet 0   potassium chloride  (KLOR-CON ) 10 MEQ tablet Take 2 tablets (20 mEq total) by mouth daily. 180 tablet 1    REFRESH CELLUVISC 1 % GEL APPLY 1 DROP TO EYE 3 (THREE) TIMES DAILY.  12   Semaglutide , 2 MG/DOSE, (OZEMPIC , 2 MG/DOSE,) 8 MG/3ML SOPN INJECT 2 MG AS DIRECTED ONCE A WEEK. 3 mL 2   tiZANidine  (ZANAFLEX ) 4 MG tablet TAKE 1 TABLET (4 MG TOTAL) BY MOUTH EVERY 8 (EIGHT) HOURS AS NEEDED FOR MUSCLE SPASMS 270 tablet 1   oxyCODONE  (OXY IR/ROXICODONE ) 5 MG immediate release tablet Take 1 tablet (5 mg total) by mouth every 8 (eight) hours as needed for severe pain (pain score 7-10). Must last 30 days 90 tablet 0   No current facility-administered medications on file prior to visit.    Review of Systems  Constitutional:  Negative for activity change, appetite change, fatigue, fever and unexpected weight change.  HENT:  Negative for congestion, ear pain, rhinorrhea, sinus pressure and sore throat.   Eyes:  Negative for pain, redness and visual disturbance.  Respiratory:  Negative for cough, shortness of breath and wheezing.   Cardiovascular:  Negative for chest pain and palpitations.  Gastrointestinal:  Positive for diarrhea. Negative for abdominal pain, blood in stool and constipation.       Loose stool on and off   Endocrine: Negative for polydipsia and polyuria.  Genitourinary:  Negative for dysuria, frequency and urgency.  Musculoskeletal:  Negative for arthralgias, back pain and myalgias.  Skin:  Negative for pallor and rash.  Allergic/Immunologic: Negative for environmental allergies.  Neurological:  Negative for dizziness, syncope and headaches.  Hematological:  Negative for adenopathy. Does not bruise/bleed easily.  Psychiatric/Behavioral:  Negative for decreased concentration and dysphoric mood. The patient is not nervous/anxious.        Objective:   Physical Exam Constitutional:      General: She is not in acute distress.    Appearance: Normal appearance. She is well-developed. She is obese. She is not ill-appearing or diaphoretic.  HENT:     Head: Normocephalic and atraumatic.   Eyes:     Conjunctiva/sclera: Conjunctivae normal.     Pupils: Pupils are equal, round, and reactive to light.  Neck:     Thyroid : No thyromegaly.     Vascular: No carotid bruit or JVD.  Cardiovascular:     Rate and Rhythm: Normal rate and regular rhythm.     Heart sounds: Normal heart sounds.     No gallop.  Pulmonary:     Effort: Pulmonary effort is normal. No respiratory distress.     Breath sounds: Normal breath sounds. No wheezing or rales.  Abdominal:     General: There is no distension or abdominal bruit.     Palpations: Abdomen is soft.  Musculoskeletal:     Cervical back: Normal range of motion and neck supple.     Right lower leg: No edema.     Left lower leg: No edema.  Lymphadenopathy:     Cervical: No cervical adenopathy.  Skin:    General: Skin is warm and dry.     Coloration: Skin is not pale.     Findings: No rash.  Neurological:     Mental Status: She is alert.     Coordination: Coordination normal.     Deep Tendon Reflexes: Reflexes are normal and symmetric. Reflexes normal.  Psychiatric:        Mood and  Affect: Mood normal.           Assessment & Plan:   Problem List Items Addressed This Visit       Cardiovascular and Mediastinum   HYPERTENSION, BENIGN ESSENTIAL   bp in fair control at this time  BP Readings from Last 1 Encounters:  01/03/24 122/70   No changes needed Most recent labs reviewed  Disc lifstyle change with low sodium diet and exercise  Plan to continue  amlodiine 10 mg daily  hctz 25 mg daily           Endocrine   Hypothyroidism   Hypothyroidism  Pt has no clinical changes No change in energy level/ hair or skin/ edema and no tremor Lab Results  Component Value Date   TSH 3.59 09/24/2023    Continues levothyroxine  112 mcg daily       Hyperlipidemia associated with type 2 diabetes mellitus (HCC)   Disc goals for lipids and reasons to control them Rev last labs with pt Rev low sat fat diet in detail   Sees  cardiology  On repatha  now along with zetia  10 mg daily  LDL of 61 in January  Expect continued improvement       Diabetes mellitus treated with injections of non-insulin medication (HCC) - Primary   Lab Results  Component Value Date   HGBA1C 5.8 (A) 01/03/2024   HGBA1C 6.4 (A) 09/10/2023   HGBA1C 6.9 (A) 05/28/2023   Improved Will continue semaglutide  2 mg weekly  Decrease metfromin xr to 500 mg once daily  Watch for low glucose  Discussed low glycemic diet and exercise   Microalb today   Follow up 6 mo      Relevant Orders   Microalbumin / creatinine urine ratio     Other   Tobacco abuse   2-3 cig per day  Did not tolerate oral med to quit Disc in detail risks of smoking and possible outcomes including copd, vascular/ heart disease, cancer , respiratory and sinus infections as well as osteoporosis  Pt voices understanding Not ready to quit now  Does have nicotine  gum to use when she is ready      Encounter for screening mammogram for breast cancer   Relevant Orders   MM 3D SCREENING MAMMOGRAM BILATERAL BREAST   Other Visit Diagnoses       Type 2 diabetes mellitus without complication, without long-term current use of insulin (HCC)       Relevant Orders   POCT HgB A1C (Completed)

## 2024-01-03 NOTE — Assessment & Plan Note (Signed)
 bp in fair control at this time  BP Readings from Last 1 Encounters:  01/03/24 122/70   No changes needed Most recent labs reviewed  Disc lifstyle change with low sodium diet and exercise  Plan to continue  amlodiine 10 mg daily  hctz 25 mg daily

## 2024-01-03 NOTE — Assessment & Plan Note (Signed)
 Disc goals for lipids and reasons to control them Rev last labs with pt Rev low sat fat diet in detail   Sees cardiology  On repatha  now along with zetia  10 mg daily  LDL of 61 in January  Expect continued improvement

## 2024-01-03 NOTE — Assessment & Plan Note (Signed)
 2-3 cig per day  Did not tolerate oral med to quit Disc in detail risks of smoking and possible outcomes including copd, vascular/ heart disease, cancer , respiratory and sinus infections as well as osteoporosis  Pt voices understanding Not ready to quit now  Does have nicotine  gum to use when she is ready

## 2024-01-03 NOTE — Assessment & Plan Note (Signed)
 Lab Results  Component Value Date   HGBA1C 5.8 (A) 01/03/2024   HGBA1C 6.4 (A) 09/10/2023   HGBA1C 6.9 (A) 05/28/2023   Improved Will continue semaglutide  2 mg weekly  Decrease metfromin xr to 500 mg once daily  Watch for low glucose  Discussed low glycemic diet and exercise   Microalb today   Follow up 6 mo

## 2024-01-03 NOTE — Patient Instructions (Addendum)
 Go down on metformin  xr 500 mg to daily  This may help keep blood sugar from being too low and also help with diarrhea   Keep walking  Stay active  Add some strength (resistance)  training  Add some strength training to your routine, this is important for bone and brain health and can reduce your risk of falls and help your body use insulin properly and regulate weight  Light weights, exercise bands , and internet videos are a good way to start  Yoga (chair or regular), machines , floor exercises or a gym with machines are also good options     Keep avoiding added sugar  Try to get most of your carbohydrates from produce (with the exception of white potatoes) and whole grains Eat less bread/pasta/rice/snack foods/cereals/sweets and other items from the middle of the grocery store (processed carbs)  Urine for protein test today   Keep thinking about quitting smoking  Try the nicotine  gum    You need your mammogram You have an order for:  [x]   3D Mammogram  []   Bone Density     Please call for appointment:   [x]   [facility]  [address] [place] [address]  [phone]  []   [facility])   [address]  [place], [place] [address]  [phone]  []   [facility]      [address] [place], [place]        [phone]         []   [facility]  [address] [place], [place]  [phone]  []  [facility]   [address]   [place], [place] [address]  [phone]  []  [facility]  [address] [place], [place] [address] [phone]    Make sure to wear two piece clothing  No lotions powders or deodorants the day of the appointment Make sure to bring picture ID and insurance card.  Bring list of medications  you are currently taking including any supplements.   Schedule your screening mammogram through MyChart!   Select [place] imaging sites can now be scheduled through MyChart.  Log into your MyChart account.  Go to 'Visit' (or 'Appointments' if  on mobile App) --> Schedule an  Appointment  Under 'Select a Reason for Visit' choose the Mammogram  Screening option.  Complete the pre-visit questions  and select the time and place that  best fits your schedule     Follow up for annual exam in about 6 months

## 2024-01-03 NOTE — Assessment & Plan Note (Signed)
 Hypothyroidism  Pt has no clinical changes No change in energy level/ hair or skin/ edema and no tremor Lab Results  Component Value Date   TSH 3.59 09/24/2023    Continues levothyroxine  112 mcg daily

## 2024-01-14 ENCOUNTER — Ambulatory Visit: Payer: Self-pay

## 2024-01-14 NOTE — Telephone Encounter (Signed)
 Thank you  Will watch for note/correspondence

## 2024-01-14 NOTE — Telephone Encounter (Signed)
 Appointment with Dr. Cherlyn Cornet 01/15/24 at 2:00 pm.

## 2024-01-14 NOTE — Telephone Encounter (Signed)
 Copied from CRM [id number]. Topic: Clinical - Red Word Triage >> Jan 14, 2024  3:08 PM Howard Macho wrote: Red Word that prompted transfer to Nurse Triage: left leg is aching and foot is sore for four days     Chief Complaint: Left Heel Pain Symptoms: pain Frequency: 3-4 days Pertinent Negatives: Patient denies swelling, injuries, bruises, redness Disposition: [] ED /[] Urgent Care (no appt availability in office) / [x] Appointment(In office/virtual)/ []  [facility]/ [] Home Care/ [] Refused Recommended Disposition /[] [facility]/ []  Follow-up with PCP Additional Notes: Patient called and advised that her left heel has been hurting 3-4 days. She denies any injuries and states that the pain is under her foot where her heel is and it's making her leg slightly sore.  She is still able to perform daily activities but it is bothering her more and more. She also denies any redness, swelling, or bruises. She states that she has some errands to run in the morning and would like an afternoon appointment if available. Appointment made for 01/15/2024 at patient's PCP Office at 2 PM with Dr Herby Lolling. Patient has no further questions and is advised she can call us  back at any time with any questions. Patient is also advised that if anything worsens to go to the Emergency Room. Patient verbalized understanding.      Reason for Disposition  [1] MODERATE pain (e.g., interferes with normal activities, limping) AND [2] present > 3 days  Answer Assessment - Initial Assessment Questions 1. ONSET: "When did the pain start?"      3-4 days 2. LOCATION: "Where is the pain located?"      Bottom of foot at heel 3. PAIN: "How bad is the pain?"    (Scale 1-10; or mild, moderate, severe)  - MILD (1-3): doesn't interfere with normal activities.   - MODERATE (4-7): interferes with normal activities (e.g., work or school) or awakens from sleep, limping.   - SEVERE (8-10): excruciating pain, unable to  do any normal activities, unable to walk.      8 4. WORK OR EXERCISE: "Has there been any recent work or exercise that involved this part of the body?"      Just walking on it 5. CAUSE: "What do you think is causing the foot pain?"     Unsure 6. OTHER SYMPTOMS: "Do you have any other symptoms?" (e.g., leg pain, rash, fever, numbness)     Patient denies  Protocols used: Foot Pain-A-AH

## 2024-01-15 ENCOUNTER — Encounter: Payer: Self-pay | Admitting: Family Medicine

## 2024-01-15 ENCOUNTER — Ambulatory Visit (INDEPENDENT_AMBULATORY_CARE_PROVIDER_SITE_OTHER): Admitting: Family Medicine

## 2024-01-15 VITALS — BP 110/80 | HR 78 | Temp 97.3°F | Ht 63.0 in | Wt 216.2 lb

## 2024-01-15 DIAGNOSIS — M722 Plantar fascial fibromatosis: Secondary | ICD-10-CM | POA: Diagnosis not present

## 2024-01-15 MED ORDER — PREDNISONE 20 MG PO TABS: ORAL_TABLET | ORAL | 0 refills | Status: DC

## 2024-01-15 NOTE — Progress Notes (Signed)
 Patient ID: Teresa Mcgee, female    DOB: 1966/02/24, 58 y.o.   MRN: [id number]  This visit was conducted in person.  BP 110/80   Pulse 78   Temp (!) 97.3 F (36.3 C) (Temporal)   Ht 5\' 3"  (1.6 m)   Wt 216 lb 4 oz (98.1 kg)   LMP  (LMP Unknown)   SpO2 95%   BMI 38.31 kg/m    CC:  Chief Complaint  Patient presents with   Foot Pain    Left Heel-radiating up leg x 4 days    Subjective:   HPI: Teresa Mcgee is a 58 y.o. female presenting on 01/15/2024 for Foot Pain (Left Heel-radiating up leg x 4 days)  New onset left heel pain.Aaron Aas started 4-5 days ago.  Radiates up left leg.  Has history of neuropathy but this feels different No fall, no injury.  She noted after wearing too big tennis shoes.  She has tried Voltaren  gel.. did not help much.  Using meloxicam  as needed.       Relevant past medical, surgical, family and social history reviewed and updated as indicated. Interim medical history since our last visit reviewed. Allergies and medications reviewed and updated. Outpatient Medications Prior to Visit  Medication Sig Dispense Refill   Accu-Chek FastClix Lancets MISC USE UP TO 4 TIMES DAILY AS DIRECTED 102 each 1   ACCU-CHEK GUIDE test strip USE UP TO 4 TIMES DAILY AS DIRECTED 100 strip 5   albuterol  (VENTOLIN  HFA) 108 (90 Base) MCG/ACT inhaler INHALE 2 PUFFS INTO THE LUNGS EVERY 4 HOURS AS NEEDED FOR (COUGH, SHORTNESS OF BREATH OR WHEEZING) 8.5 each 2   amLODipine  (NORVASC ) 10 MG tablet TAKE 1 TABLET BY MOUTH EVERY DAY 90 tablet 2   azelastine  (OPTIVAR ) 0.05 % ophthalmic solution Place 1 drop into both eyes 2 (two) times daily as needed. 6 mL 3   blood glucose meter kit and supplies Dispense based on patient and insurance preference. Use up to four times daily as directed. (FOR ICD-10 E10.9, E11.9). 1 each 0   [employer] D3 125 MCG (5000 UT) capsule TAKE 1 CAPSULE BY MOUTH DAILY WITH BREAKFAST. TAKE ALONG WITH CALCIUM  AND MAGNESIUM .  5   ELDERBERRY PO Take by mouth.      Evolocumab  (REPATHA  SURECLICK) 140 MG/ML SOAJ INJECT 140 MG INTO THE SKIN EVERY 14 (FOURTEEN) DAYS. 6 mL 3   ezetimibe  (ZETIA ) 10 MG tablet TAKE 1 TABLET BY MOUTH EVERY DAY 90 tablet 2   fluticasone  (FLONASE ) 50 MCG/ACT nasal spray SPRAY 2 SPRAYS INTO EACH NOSTRIL EVERY DAY 48 mL 1   gabapentin  (NEURONTIN ) 100 MG capsule TAKE 1 CAPSULE (100 MG TOTAL) BY MOUTH 2 (TWO) TIMES DAILY AND 3 CAPSULES (300 MG TOTAL) AT BEDTIME. 150 capsule 3   Glucose Blood (BLOOD GLUCOSE TEST STRIPS) STRP 1 each by In Vitro route daily. May substitute to any manufacturer covered by patient's insurance. 100 strip 0   hydrochlorothiazide  (HYDRODIURIL ) 25 MG tablet TAKE 1 TABLET (25 MG TOTAL) BY MOUTH DAILY. AS DIRECTED 90 tablet 1   levocetirizine (XYZAL ) 5 MG tablet TAKE 1 TABLET BY MOUTH EVERY DAY IN THE EVENING 90 tablet 1   levothyroxine  (SYNTHROID ) 112 MCG tablet TAKE 1 TABLET BY MOUTH DAILY BEFORE BREAKFAST. 90 tablet 2   Magnesium  Oxide -Mg Supplement 500 MG CAPS TAKE 1 CAPSULE BY MOUTH TWICE A DAY (AT 8AM & 10PM) 180 capsule 0   meloxicam  (MOBIC ) 15 MG tablet TAKE ONE PILL BY  MOUTH WITH FOOD EVERY OTHER DAY AS NEEDED FOR PAIN 45 tablet 1   metFORMIN  (GLUCOPHAGE -XR) 500 MG 24 hr tablet TAKE 1 TABLET BY MOUTH 2 TIMES DAILY WITH A MEAL. (Patient taking differently: Take 500 mg by mouth daily with breakfast.) 180 tablet 0   naloxone  (NARCAN ) nasal spray 4 mg/0.1 mL Place 1 spray into the nose as needed for up to 365 doses (for opioid-induced respiratory depresssion). In case of emergency (overdose), spray once into each nostril. If no response within 3 minutes, repeat application and call 911. 1 each 0   Olopatadine HCl 0.2 % SOLN INSTILL 1 DROP INTO BOTH EYES EVERY DAY AS NEEDED     oxyCODONE  (OXY IR/ROXICODONE ) 5 MG immediate release tablet Take 1 tablet (5 mg total) by mouth every 8 (eight) hours as needed for severe pain (pain score 7-10). Must last 30 days 90 tablet 0   [START ON 01/24/2024] oxyCODONE  (OXY  IR/ROXICODONE ) 5 MG immediate release tablet Take 1 tablet (5 mg total) by mouth every 8 (eight) hours as needed for severe pain (pain score 7-10). Must last 30 days 90 tablet 0   pantoprazole  (PROTONIX ) 40 MG tablet TAKE 1 TABLET BY MOUTH EVERY DAY 90 tablet 0   potassium chloride  (KLOR-CON ) 10 MEQ tablet Take 2 tablets (20 mEq total) by mouth daily. 180 tablet 1   REFRESH CELLUVISC 1 % GEL APPLY 1 DROP TO EYE 3 (THREE) TIMES DAILY.  12   Semaglutide , 2 MG/DOSE, (OZEMPIC , 2 MG/DOSE,) 8 MG/3ML SOPN INJECT 2 MG AS DIRECTED ONCE A WEEK. 3 mL 2   tiZANidine  (ZANAFLEX ) 4 MG tablet TAKE 1 TABLET (4 MG TOTAL) BY MOUTH EVERY 8 (EIGHT) HOURS AS NEEDED FOR MUSCLE SPASMS 270 tablet 1   oxyCODONE  (OXY IR/ROXICODONE ) 5 MG immediate release tablet Take 1 tablet (5 mg total) by mouth every 8 (eight) hours as needed for severe pain (pain score 7-10). Must last 30 days 90 tablet 0   No facility-administered medications prior to visit.     Per HPI unless specifically indicated in ROS section below Review of Systems  Constitutional:  Negative for fatigue and fever.  HENT:  Negative for congestion.   Eyes:  Negative for pain.  Respiratory:  Negative for cough and shortness of breath.   Cardiovascular:  Negative for chest pain, palpitations and leg swelling.  Gastrointestinal:  Negative for abdominal pain.  Genitourinary:  Negative for dysuria and vaginal bleeding.  Musculoskeletal:  Negative for back pain.  Neurological:  Negative for syncope, light-headedness and headaches.  Psychiatric/Behavioral:  Negative for dysphoric mood.    Objective:  BP 110/80   Pulse 78   Temp (!) 97.3 F (36.3 C) (Temporal)   Ht 5\' 3"  (1.6 m)   Wt 216 lb 4 oz (98.1 kg)   LMP  (LMP Unknown)   SpO2 95%   BMI 38.31 kg/m   Wt Readings from Last 3 Encounters:  01/15/24 216 lb 4 oz (98.1 kg)  01/03/24 217 lb 6 oz (98.6 kg)  12/10/23 217 lb (98.4 kg)      Physical Exam Constitutional:      General: She is not in acute  distress.    Appearance: Normal appearance. She is well-developed. She is not ill-appearing or toxic-appearing.  HENT:     Head: Normocephalic.     Right Ear: Hearing, tympanic membrane, ear canal and external ear normal. Tympanic membrane is not erythematous, retracted or bulging.     Left Ear: Hearing, tympanic membrane, ear canal  and external ear normal. Tympanic membrane is not erythematous, retracted or bulging.     Nose: No mucosal edema or rhinorrhea.     Right Sinus: No maxillary sinus tenderness or frontal sinus tenderness.     Left Sinus: No maxillary sinus tenderness or frontal sinus tenderness.     Mouth/Throat:     Pharynx: Uvula midline.  Eyes:     General: Lids are normal. Lids are everted, no foreign bodies appreciated.     Conjunctiva/sclera: Conjunctivae normal.     Pupils: Pupils are equal, round, and reactive to light.  Neck:     Thyroid : No thyroid  mass or thyromegaly.     Vascular: No carotid bruit.     Trachea: Trachea normal.  Cardiovascular:     Rate and Rhythm: Normal rate and regular rhythm.     Pulses: Normal pulses.     Heart sounds: Normal heart sounds, S1 normal and S2 normal. No murmur heard.    No friction rub. No gallop.  Pulmonary:     Effort: Pulmonary effort is normal. No tachypnea or respiratory distress.     Breath sounds: Normal breath sounds. No decreased breath sounds, wheezing, rhonchi or rales.  Abdominal:     General: Bowel sounds are normal.     Palpations: Abdomen is soft.     Tenderness: There is no abdominal tenderness.  Musculoskeletal:     Cervical back: Normal range of motion and neck supple.  Feet:     Right foot:     Skin integrity: Skin integrity normal.     Left foot:     Skin integrity: Skin integrity normal. No ulcer, blister, skin breakdown, erythema or warmth.     Comments: TTP over insertion of plantar fascia Skin:    General: Skin is warm and dry.     Findings: No rash.  Neurological:     Mental Status: She is  alert.  Psychiatric:        Mood and Affect: Mood is not anxious or depressed.        Speech: Speech normal.        Behavior: Behavior normal. Behavior is cooperative.        Thought Content: Thought content normal.        Judgment: Judgment normal.       Results for orders placed or performed in visit on 01/03/24  POCT HgB A1C   Collection Time: 01/03/24 11:38 AM  Result Value Ref Range   Hemoglobin A1C 5.8 (A) 4.0 - 5.6 %   HbA1c POC (<> result, manual entry)     HbA1c, POC (prediabetic range)     HbA1c, POC (controlled diabetic range)    Microalbumin / creatinine urine ratio   Collection Time: 01/03/24 11:52 AM  Result Value Ref Range   Microalb, Ur 1.1 0.0 - 1.9 mg/dL   Creatinine,U 161.0 mg/dL   Microalb Creat Ratio 8.0 0.0 - 30.0 mg/g    Assessment and Plan  Plantar fasciitis of left foot Assessment & Plan: Acute,  Ice massage.  Start home stretches.  Hold meloxicam  and take prednisone  taper. Once done you can start back on meloxicam  daily for a few week.  Follow up with [facility] if not improving.   Other orders -     predniSONE ; 3 tabs by mouth daily x 3 days, then 2 tabs by mouth daily x 2 days then 1 tab by mouth daily x 2 days  Dispense: 15 tablet; Refill: 0  No follow-ups on file.   Herby Lolling, MD

## 2024-01-15 NOTE — Patient Instructions (Signed)
 Ice massage.  Start home stretches.  Hold meloxicam  and take prednisone  taper. Once done you can start back on meloxicam  daily for a few week.  Follow up with [facility] if not improving.

## 2024-01-15 NOTE — Assessment & Plan Note (Signed)
 Acute,  Ice massage.  Start home stretches.  Hold meloxicam  and take prednisone  taper. Once done you can start back on meloxicam  daily for a few week.  Follow up with [facility] if not improving.

## 2024-01-16 ENCOUNTER — Ambulatory Visit
Admission: RE | Admit: 2024-01-16 | Discharge: 2024-01-16 | Disposition: A | Discharge status: Home / Self Care | Source: Ambulatory Visit | Attending: Family Medicine | Admitting: Family Medicine

## 2024-01-16 DIAGNOSIS — Z1231 Encounter for screening mammogram for malignant neoplasm of breast: Secondary | ICD-10-CM | POA: Diagnosis not present

## 2024-01-29 ENCOUNTER — Other Ambulatory Visit: Payer: Self-pay | Admitting: Family Medicine

## 2024-01-29 DIAGNOSIS — J302 Other seasonal allergic rhinitis: Secondary | ICD-10-CM

## 2024-02-12 ENCOUNTER — Other Ambulatory Visit: Payer: Self-pay | Admitting: Family Medicine

## 2024-02-12 NOTE — Telephone Encounter (Signed)
 Last filled on 11/27/23 #3 mL/ 2 refill  CPE scheduled 09/09/23

## 2024-02-15 ENCOUNTER — Other Ambulatory Visit: Payer: Self-pay | Admitting: Family Medicine

## 2024-02-18 NOTE — Telephone Encounter (Unsigned)
 Copied from CRM [id number]. Topic: Clinical - Medication Question >> Feb 18, 2024  2:04 PM [place] A wrote: Reason for CRM: Patient was calling to see if pharmacy has called pantoprazole  (PROTONIX ) 40 MG tablet in. Medication was signed today as per medication Order History

## 2024-02-20 ENCOUNTER — Ambulatory Visit: Attending: Nurse Practitioner | Admitting: Nurse Practitioner

## 2024-02-20 ENCOUNTER — Encounter: Payer: Self-pay | Admitting: Nurse Practitioner

## 2024-02-20 DIAGNOSIS — M25551 Pain in right hip: Secondary | ICD-10-CM | POA: Diagnosis not present

## 2024-02-20 DIAGNOSIS — M533 Sacrococcygeal disorders, not elsewhere classified: Secondary | ICD-10-CM | POA: Insufficient documentation

## 2024-02-20 DIAGNOSIS — M47816 Spondylosis without myelopathy or radiculopathy, lumbar region: Secondary | ICD-10-CM | POA: Diagnosis present

## 2024-02-20 DIAGNOSIS — M79604 Pain in right leg: Secondary | ICD-10-CM | POA: Insufficient documentation

## 2024-02-20 DIAGNOSIS — M25561 Pain in right knee: Secondary | ICD-10-CM | POA: Insufficient documentation

## 2024-02-20 DIAGNOSIS — M25562 Pain in left knee: Secondary | ICD-10-CM | POA: Diagnosis not present

## 2024-02-20 DIAGNOSIS — Z79891 Long term (current) use of opiate analgesic: Secondary | ICD-10-CM | POA: Insufficient documentation

## 2024-02-20 DIAGNOSIS — Z79899 Other long term (current) drug therapy: Secondary | ICD-10-CM | POA: Insufficient documentation

## 2024-02-20 DIAGNOSIS — M79601 Pain in right arm: Secondary | ICD-10-CM | POA: Insufficient documentation

## 2024-02-20 DIAGNOSIS — M542 Cervicalgia: Secondary | ICD-10-CM | POA: Insufficient documentation

## 2024-02-20 DIAGNOSIS — M79605 Pain in left leg: Secondary | ICD-10-CM | POA: Diagnosis not present

## 2024-02-20 DIAGNOSIS — M545 Low back pain, unspecified: Secondary | ICD-10-CM | POA: Insufficient documentation

## 2024-02-20 DIAGNOSIS — G894 Chronic pain syndrome: Secondary | ICD-10-CM | POA: Insufficient documentation

## 2024-02-20 DIAGNOSIS — M25552 Pain in left hip: Secondary | ICD-10-CM | POA: Diagnosis not present

## 2024-02-20 DIAGNOSIS — F1120 Opioid dependence, uncomplicated: Secondary | ICD-10-CM | POA: Diagnosis present

## 2024-02-20 DIAGNOSIS — M79602 Pain in left arm: Secondary | ICD-10-CM | POA: Insufficient documentation

## 2024-02-20 DIAGNOSIS — G8929 Other chronic pain: Secondary | ICD-10-CM | POA: Insufficient documentation

## 2024-02-20 MED ORDER — OXYCODONE HCL 5 MG PO TABS: 5.0000 mg | ORAL_TABLET | Freq: Three times a day (TID) | ORAL | 0 refills | Status: DC | PRN

## 2024-02-20 NOTE — Progress Notes (Signed)
 Nursing Pain Medication Assessment:  Safety precautions to be maintained throughout the outpatient stay will include: orient to surroundings, keep bed in low position, maintain call bell within reach at all times, provide assistance with transfer out of bed and ambulation.  Medication Inspection Compliance: Pill count conducted under aseptic conditions, in front of the patient. Neither the pills nor the bottle was removed from the patient's sight at any time. Once count was completed pills were immediately returned to the patient in their original bottle.  Medication: Oxycodone  IR Pill/Patch Count: 11 of 90 pills/patches remain Pill/Patch Appearance: Markings consistent with prescribed medication Bottle Appearance: Standard pharmacy container. Clearly labeled. Filled Date: 06 / 13 / 2025 Last Medication intake:  Today

## 2024-02-20 NOTE — Progress Notes (Signed)
 PROVIDER NOTE: Interpretation of information contained herein should be left to medically-trained personnel. Specific patient instructions are provided elsewhere under Patient Instructions section of medical record. This document was created in part using AI and STT-dictation technology, any transcriptional errors that may result from this process are unintentional.  Patient: Teresa Mcgee  Service: E/M   PCP: Randeen Laine LABOR, MD  DOB: March 31, 1966  DOS: 02/20/2024  Provider: Emmy MARLA Blanch, NP  MRN: [id number]  Delivery: Face-to-face  Specialty: Interventional Pain Management  Type: Established Patient  Setting: Ambulatory outpatient facility  Specialty designation: 09  Referring Prov.: Tower, Laine LABOR, MD  Location: Outpatient office facility       History of present illness (HPI) [name], a 58 y.o. year old female, is here today because of her No primary diagnosis found.. [name]'s primary complain today is Back Pain  Pertinent problems: [name] has muscle spasm of neck; numbness and tingling in both hands; chronic low back pain (bilateral) (L>R) w/sciatica; chronic lower extremity pain (2ry area of pain) (bilateral) (L>R); chronic neck pain (3ry area of pain) (bilateral) (L>R); and chronic pain syndrome on their pertinent problem list.   Pain Assessment: Severity of Chronic pain is reported as a 6 /10. Location: Buttocks Lower/Radiates to hips and legs. Onset: More than a month ago. Quality: Constant, Aching. Timing: Constant. Modifying factor(s): Sitting or Lying down, medication. Vitals:  height is 5' 3 (1.6 m) and weight is 214 lb (97.1 kg). Her temperature is 97.7 F (36.5 C). Her blood pressure is 130/78 and her pulse is 77. Her respiration is 18 and oxygen saturation is 100%.  BMI: Estimated body mass index is 37.91 kg/m as calculated from the following:   Height as of this encounter: 5' 3 (1.6 m).   Weight as of this encounter: 214 lb (97.1 kg).  Last encounter:  11/23/2023. Last procedure: Visit date not found.  Reason for encounter: medication management. The patient indicates doing well with the current medication regimen. No adverse reaction or side effects reported to the medication.  No additional concerns or symptoms were reported at this time.   Pharmacotherapy Assessment    Oxycodone  (Oxy IR/Roxicodone ) 5 mg immediate release tablet as needed for pain. MME=22  Monitoring: [name]: PDMP reviewed during this encounter.       Pharmacotherapy: No side-effects or adverse reactions reported. Compliance: No problems identified. Effectiveness: Clinically acceptable.  [name], [place]  02/20/2024 11:47 AM  Sign when Signing Visit Nursing Pain Medication Assessment:  Safety precautions to be maintained throughout the outpatient stay will include: orient to surroundings, keep bed in low position, maintain call bell within reach at all times, provide assistance with transfer out of bed and ambulation.  Medication Inspection Compliance: Pill count conducted under aseptic conditions, in front of the patient. Neither the pills nor the bottle was removed from the patient's sight at any time. Once count was completed pills were immediately returned to the patient in their original bottle.  Medication: Oxycodone  IR Pill/Patch Count: 11 of 90 pills/patches remain Pill/Patch Appearance: Markings consistent with prescribed medication Bottle Appearance: Standard pharmacy container. Clearly labeled. Filled Date: 06 / 13 / 2025 Last Medication intake:  Today    UDS:  Summary  Date Value Ref Range Status  02/28/2023 Note  Final    Comment:    ==================================================================== ToxASSURE Select 13 (MW) ==================================================================== Test  Result       Flag       Units  Drug Present and Declared for Prescription Verification   Oxycodone                        156          EXPECTED   ng/mg creat   Oxymorphone                    484          EXPECTED   ng/mg creat   Noroxycodone                   181          EXPECTED   ng/mg creat   Noroxymorphone                 82           EXPECTED   ng/mg creat    Sources of oxycodone  are scheduled prescription medications.    Oxymorphone, noroxycodone, and noroxymorphone are expected    metabolites of oxycodone . Oxymorphone is also available as a    scheduled prescription medication.  ==================================================================== Test                      Result    Flag   Units      Ref Range   Creatinine              94               mg/dL      >=79 ==================================================================== Declared Medications:  The flagging and interpretation on this report are based on the  following declared medications.  Unexpected results may arise from  inaccuracies in the declared medications.   **Note: The testing scope of this panel includes these medications:   Oxycodone    **Note: The testing scope of this panel does not include the  following reported medications:   Albuterol   Amlodipine  (Norvasc )  Azelastine  (Optivar )  Cholecalciferol  Evolocumab  (Repatha )  Eye Drops  Ezetimibe  (Zetia )  Fluticasone  (Flonase )  Gabapentin   Hydrochlorothiazide  (Hydrodiuril )  Levocetirizine (Xyzal )  Levothyroxine  (Synthroid )  Magnesium  (Mag-Ox)  Meloxicam  (Mobic )  Metformin   Naloxone  (Narcan )  Olopatadine  Potassium (Klor-Con )  Tizanidine  (Zanaflex ) ==================================================================== For clinical consultation, please call [phone]. ====================================================================     No results found for: CBDTHCR No results found for: D8THCCBX No results found for: D9THCCBX  ROS  Constitutional: Denies any fever or chills Gastrointestinal: No reported hemesis, hematochezia, vomiting, or  acute GI distress Musculoskeletal: Low back pain Neurological: No reported episodes of acute onset apraxia, aphasia, dysarthria, agnosia, amnesia, paralysis, loss of coordination, or loss of consciousness  Medication Review  Accu-Chek FastClix Lancets, BLOOD GLUCOSE TEST STRIPS, Carboxymethylcellulose Sod PF, Cholecalciferol, Elderberry, Evolocumab , Magnesium  Oxide -Mg Supplement, Olopatadine HCl, Semaglutide  (2 MG/DOSE), albuterol , amLODipine , azelastine , blood glucose meter kit and supplies, ezetimibe , fluticasone , gabapentin , glucose blood, hydrochlorothiazide , levocetirizine, levothyroxine , meloxicam , metFORMIN , naloxone , oxyCODONE , pantoprazole , potassium chloride , predniSONE , and tiZANidine   History Review  Allergy: Ms. Braaten is allergic to statins. Drug: Ms. Cleland  reports no history of drug use. Alcohol:  reports no history of alcohol use. Tobacco:  reports that she has been smoking cigarettes. She has a 10 pack-year smoking history. She has never used smokeless tobacco. Social: Ms. Staunton  reports that she has been smoking cigarettes. She has a 10 pack-year smoking history. She has never used smokeless  tobacco. She reports that she does not drink alcohol and does not use drugs. Medical:  has a past medical history of Arthritis, Bell's palsy (may 2012), Chest pain, Diabetes mellitus without complication (HCC), GERD (gastroesophageal reflux disease), H/O hiatal hernia, Hyperlipidemia, Hypertension, Hypothyroidism, Shingles, Shortness of breath, and Sleep apnea. Surgical: Ms. Romito  has a past surgical history that includes Thyroidectomy (1998); cesarian; Partial hysterectomy (2000); Cesarean section; Tubal ligation; Tooth extraction; Abdominal hysterectomy; Colonoscopy with propofol  (N/A, 11/27/2019); and Esophagogastroduodenoscopy (egd) with propofol  (N/A, 07/20/2020). Family: family history includes Breast cancer in her maternal aunt; Diabetes in her brother and sister; Heart attack (age of  onset: 78) in her brother; Heart attack (age of onset: 31) in her sister; Heart attack (age of onset: 104) in her father; Stroke (age of onset: 79) in her brother; Stroke (age of onset: 43) in her sister.  Laboratory Chemistry Profile   Renal Lab Results  Component Value Date   BUN 5 (L) 09/24/2023   CREATININE 0.91 09/24/2023   BCR 8 (L) 01/24/2018   GFR 70.21 09/24/2023   GFRAA >60 05/25/2018   GFRNONAA >60 12/29/2022    Hepatic Lab Results  Component Value Date   AST 36 09/10/2023   ALT 36 (H) 09/10/2023   ALBUMIN 4.5 09/10/2023   ALKPHOS 70 09/10/2023   LIPASE 25 05/25/2018    Electrolytes Lab Results  Component Value Date   NA 141 09/24/2023   K 3.6 09/24/2023   CL 98 09/24/2023   CALCIUM  9.7 09/24/2023   MG 1.7 02/21/2022    Bone Lab Results  Component Value Date   VD25OH 68.72 08/19/2021   25OHVITD1 9.3 (L) 01/24/2018   25OHVITD2 <1.0 01/24/2018   25OHVITD3 9.3 01/24/2018    Inflammation (CRP: Acute Phase) (ESR: Chronic Phase) Lab Results  Component Value Date   CRP 5.5 (H) 01/24/2018   ESRSEDRATE 40 01/24/2018         Note: Above Lab results reviewed.  Recent Imaging Review  MM 3D SCREENING MAMMOGRAM BILATERAL BREAST CLINICAL DATA:  Screening.  EXAM: DIGITAL SCREENING BILATERAL MAMMOGRAM WITH TOMOSYNTHESIS AND CAD  TECHNIQUE: Bilateral screening digital craniocaudal and mediolateral oblique mammograms were obtained. Bilateral screening digital breast tomosynthesis was performed. The images were evaluated with computer-aided detection.  COMPARISON:  Previous exam(s).  ACR Breast Density Category a: The breasts are almost entirely fatty.  FINDINGS: There are no findings suspicious for malignancy.  IMPRESSION: No mammographic evidence of malignancy. A result letter of this screening mammogram will be mailed directly to the patient.  RECOMMENDATION: Screening mammogram in one year. (Code:SM-B-01Y)  BI-RADS CATEGORY  1:  Negative.  Electronically Signed   By: Toribio Agreste M.D.   On: 01/22/2024 10:27 Note: Reviewed        Physical Exam  Vitals: BP 130/78   Pulse 77   Temp 97.7 F (36.5 C)   Resp 18   Ht 5' 3 (1.6 m)   Wt 214 lb (97.1 kg)   LMP  (LMP Unknown)   SpO2 100%   BMI 37.91 kg/m  BMI: Estimated body mass index is 37.91 kg/m as calculated from the following:   Height as of this encounter: 5' 3 (1.6 m).   Weight as of this encounter: 214 lb (97.1 kg). Ideal: Ideal body weight: 52.4 kg (115 lb 8.3 oz) Adjusted ideal body weight: 70.3 kg (154 lb 14.6 oz) General appearance: Well nourished, well developed, and well hydrated. In no apparent acute distress Mental status: Alert, oriented x 3 (person, place, &  time)       Respiratory: No evidence of acute respiratory distress Eyes: PERLA   Assessment   Diagnosis Status  1. Chronic low back pain (1ry area of Pain) (Bilateral) (L>R) w/o sciatica   2. Chronic lower extremity pain (2ry area of Pain) (Bilateral) (L>R)   3. Chronic neck pain (3ry area of Pain) (Bilateral) (L>R)   4. Chronic knee pain (Bilateral) (R>L)   5. Chronic hip pain (Bilateral) (R>L)   6. Chronic pain syndrome   7. Pharmacologic therapy   8. Chronic upper extremity pain (4th area of Pain) (Bilateral) (L>R)   9. Chronic sacroiliac joint pain (Left)   10. Lumbar facet syndrome (Bilateral)   11. Chronic use of opiate for therapeutic purpose   12. Uncomplicated opioid dependence (HCC)   13. Encounter for medication management   14. Encounter for chronic pain management    Controlled Controlled Controlled   Updated Problems: No problems updated.  Plan of Care  Problem-specific:  Assessment and Plan We will continue on current medication regimen.  Prescribing drug monitoring (PDMP) reviewed; findings consistent with the use of prescribed medication and no evidence of narcotic misuse or abuse. Routine UDS ordered today.  Schedule follow-up in 90 days for  medication management.  No other new issues or problems reported to this visit.   Ms. RHEYA MINOGUE has a current medication list which includes the following long-term medication(s): albuterol , amlodipine , ezetimibe , fluticasone , gabapentin , hydrochlorothiazide , levocetirizine, levothyroxine , magnesium  oxide -mg supplement, meloxicam , metformin , pantoprazole , potassium chloride , [START ON 02/24/2024] oxycodone , [START ON 03/25/2024] oxycodone , and [START ON 04/24/2024] oxycodone .  Pharmacotherapy (Medications Ordered): Meds ordered this encounter  Medications   oxyCODONE  (OXY IR/ROXICODONE ) 5 MG immediate release tablet    Sig: Take 1 tablet (5 mg total) by mouth every 8 (eight) hours as needed for severe pain (pain score 7-10). Must last 30 days    Dispense:  90 tablet    Refill:  0    DO NOT: delete (not duplicate); no partial-fill (will deny script to complete), no refill request (F/U required). DISPENSE: 1 day early if closed on fill date. WARN: No CNS-depressants within 8 hrs of med.   oxyCODONE  (OXY IR/ROXICODONE ) 5 MG immediate release tablet    Sig: Take 1 tablet (5 mg total) by mouth every 8 (eight) hours as needed for severe pain (pain score 7-10). Must last 30 days    Dispense:  90 tablet    Refill:  0    DO NOT: delete (not duplicate); no partial-fill (will deny script to complete), no refill request (F/U required). DISPENSE: 1 day early if closed on fill date. WARN: No CNS-depressants within 8 hrs of med.   oxyCODONE  (OXY IR/ROXICODONE ) 5 MG immediate release tablet    Sig: Take 1 tablet (5 mg total) by mouth every 8 (eight) hours as needed for severe pain (pain score 7-10). Must last 30 days    Dispense:  90 tablet    Refill:  0    DO NOT: delete (not duplicate); no partial-fill (will deny script to complete), no refill request (F/U required). DISPENSE: 1 day early if closed on fill date. WARN: No CNS-depressants within 8 hrs of med.   Orders:  Orders Placed This Encounter   Procedures   ToxASSURE Select 13 (MW), Urine    Volume: 30 ml(s). Minimum 3 ml of urine is needed. Document temperature of fresh sample. Indications: Long term (current) use of opiate analgesic (S20.108)    Release to patient:  Immediate        Return in about 3 months (around 05/22/2024) for (F2F), (MM), Emmy Blanch NP.    Recent Visits Date Type Provider Dept  11/23/23 Office Visit Sahirah Rudell K, NP [facility]  Showing recent visits within past 90 days and meeting all other requirements Today's Visits Date Type Provider Dept  02/20/24 Office Visit Esmay Amspacher K, NP [facility]  Showing today's visits and meeting all other requirements Future Appointments No visits were found meeting these conditions. Showing future appointments within next 90 days and meeting all other requirements  I discussed the assessment and treatment plan with the patient. The patient was provided an opportunity to ask questions and all were answered. The patient agreed with the plan and demonstrated an understanding of the instructions.  Patient advised to call back or seek an in-person evaluation if the symptoms or condition worsens.  Duration of encounter: 30 minutes.  Total time on encounter, as per AMA guidelines included both the face-to-face and non-face-to-face time personally spent by the physician and/or other qualified health care professional(s) on the day of the encounter (includes time in activities that require the physician or other qualified health care professional and does not include time in activities normally performed by clinical staff). Physician's time may include the following activities when performed: Preparing to see the patient (e.g., pre-charting review of records, searching for previously ordered imaging, lab work, and nerve conduction tests) Review of prior analgesic pharmacotherapies. Reviewing PMP Interpreting ordered tests (e.g., lab work, imaging,  nerve conduction tests) Performing post-procedure evaluations, including interpretation of diagnostic procedures Obtaining and/or reviewing separately obtained history Performing a medically appropriate examination and/or evaluation Counseling and educating the patient/family/caregiver Ordering medications, tests, or procedures Referring and communicating with other health care professionals (when not separately reported) Documenting clinical information in the electronic or other health record Independently interpreting results (not separately reported) and communicating results to the patient/ family/caregiver Care coordination (not separately reported)  Note by: Rosmary Dionisio K Delmon Andrada, NP (TTS and AI technology used. I apologize for any typographical errors that were not detected and corrected.) Date: 02/20/2024; Time: 12:33 PM

## 2024-02-24 LAB — TOXASSURE SELECT 13 (MW), URINE

## 2024-02-25 ENCOUNTER — Encounter: Payer: Self-pay | Admitting: Podiatry

## 2024-02-25 ENCOUNTER — Ambulatory Visit: Admitting: Podiatry

## 2024-02-25 DIAGNOSIS — B351 Tinea unguium: Secondary | ICD-10-CM | POA: Diagnosis not present

## 2024-02-25 DIAGNOSIS — M79675 Pain in left toe(s): Secondary | ICD-10-CM

## 2024-02-25 DIAGNOSIS — E119 Type 2 diabetes mellitus without complications: Secondary | ICD-10-CM

## 2024-02-25 DIAGNOSIS — M79674 Pain in right toe(s): Secondary | ICD-10-CM

## 2024-02-25 NOTE — Progress Notes (Signed)
  Subjective:  Patient ID: Teresa Mcgee, female    DOB: [birth year],  MRN: [id number]  Teresa Mcgee presents to clinic today for preventative diabetic foot care and painful mycotic toenails of both feet that are difficult to trim. Pain interferes with daily activities and wearing enclosed shoe gear comfortably.  Chief Complaint  Patient presents with   Nail Problem    Thick painful toenails, 3 month follow up -diabetic   New problem(s): None.   PCP is Tower, Laine LABOR, MD. Teresa Mcgee 01/03/2024.  Allergies  Allergen Reactions   Statins     myopathy    Review of Systems: Negative except as noted in the HPI.  Objective: No changes noted in today's physical examination. There were no vitals filed for this visit. Teresa Mcgee is a pleasant 58 y.o. female morbidly obese in NAD. AAO x 3.  Vascular Examination: CFT <3 seconds b/l. DP/PT pulses faintly palpable b/l. Pedal hair diminished b/l. Skin temperature gradient warm to warm b/l. No pain with calf compression. No ischemia or gangrene. No cyanosis or clubbing noted b/l.    Neurological Examination: Sensation grossly intact b/l with 10 gram monofilament. Vibratory sensation intact b/l.   Dermatological Examination: Pedal skin warm and supple b/l.   No open wounds. No interdigital macerations.  Toenails 1-5 b/l thick, discolored, elongated with subungual debris and pain on dorsal palpation.    No hyperkeratotic nor porokeratotic lesions present on today's visit.  Musculoskeletal Examination: Muscle strength 5/5 to all lower extremity muscle groups bilaterally. Pes planus deformity noted bilateral LE.  Radiographs: None  Last A1c:      Latest Ref Rng & Units 01/03/2024   11:38 AM 09/10/2023   11:22 AM 05/28/2023   11:37 AM  Hemoglobin A1C  Hemoglobin-A1c 4.0 - 5.6 % 5.8  6.4  6.9     Assessment/Plan: 1. Pain due to onychomycosis of toenails of both feet   2. Type 2 diabetes mellitus without complication, without  long-term current use of insulin (HCC)    Patient was evaluated and treated. All patient's and/or POA's questions/concerns addressed on today's visit. Mycotic toenails 1-5 debrided in length and girth without incident.  Continue daily foot inspections and monitor blood glucose per PCP/Endocrinologist's recommendations.Continue soft, supportive shoe gear daily. Report any pedal injuries to medical professional. Call office if there are any quesitons/concerns. -Patient/POA to call should there be question/concern in the interim.   Return in about 3 months (around 05/27/2024).  Teresa Mcgee, DPM       LOCATION: [address] [place] [address]                   Office [phone]   [facility] LOCATION: [address] [place], [place] [address] Office [phone]

## 2024-02-28 ENCOUNTER — Other Ambulatory Visit: Payer: Self-pay | Admitting: Family Medicine

## 2024-03-19 ENCOUNTER — Other Ambulatory Visit: Payer: Self-pay | Admitting: Family Medicine

## 2024-03-22 ENCOUNTER — Other Ambulatory Visit: Payer: Self-pay | Admitting: Family Medicine

## 2024-03-22 DIAGNOSIS — M792 Neuralgia and neuritis, unspecified: Secondary | ICD-10-CM

## 2024-03-24 NOTE — Telephone Encounter (Signed)
 Last filled on 11/07/23 #150 caps/ 3 refills   CPE scheduled 09/08/24

## 2024-03-25 ENCOUNTER — Other Ambulatory Visit: Payer: Self-pay | Admitting: Family Medicine

## 2024-04-06 ENCOUNTER — Other Ambulatory Visit: Payer: Self-pay | Admitting: Family Medicine

## 2024-04-06 DIAGNOSIS — I10 Essential (primary) hypertension: Secondary | ICD-10-CM

## 2024-04-07 ENCOUNTER — Ambulatory Visit: Payer: Self-pay

## 2024-04-07 NOTE — Telephone Encounter (Signed)
 FYI Only or Action Required?: FYI only for provider.  Patient was last seen in primary care on 01/15/2024 by Avelina Greig BRAVO, MD.  Called Nurse Triage reporting Pain.  Symptoms began a week ago.  Interventions attempted: Prescription medications: rx pain medication.  Symptoms are: gradually worsening.  Triage Disposition: See Physician Within 24 Hours  Patient/caregiver understands and will follow disposition?: Yes  Copied from CRM #[id number]. Topic: Clinical - Red Word Triage >> Apr 07, 2024 11:48 AM [place] A wrote: [facility] that prompted transfer to Nurse Triage: Patient has pain when she raises her arms up. Patient said this started about a week. Reason for Disposition  Weakness (i.e., loss of strength) of new-onset in hand or fingers  (Exceptions: Not truly weak, hand feels weak because of pain; weakness present > 2 weeks)  Answer Assessment - Initial Assessment Questions 1. ONSET: When did the pain start?     X week 2. LOCATION: Where is the pain located?     Bilateral arms - left worse 3. PAIN: How bad is the pain? (Scale 0-10; or none, mild, moderate, severe)     9/10 4. WORK OR EXERCISE: Has there been any recent work or exercise that involved this part of the body?     na 5. CAUSE: What do you think is causing the arm pain?     unknown 6. OTHER SYMPTOMS: Do you have any other symptoms? (e.g., neck pain, swelling, rash, fever, numbness, weakness)     no 7. PREGNANCY: Is there any chance you are pregnant? When was your last menstrual period?     na  Protocols used: Arm Pain-A-AH

## 2024-04-07 NOTE — Telephone Encounter (Signed)
Appt scheduled tomorrow with PCP

## 2024-04-07 NOTE — Telephone Encounter (Signed)
 Will see patient then Agree with ER and UC precautions

## 2024-04-08 ENCOUNTER — Ambulatory Visit: Payer: Self-pay | Admitting: Family Medicine

## 2024-04-08 ENCOUNTER — Encounter: Payer: Self-pay | Admitting: Family Medicine

## 2024-04-08 ENCOUNTER — Ambulatory Visit (INDEPENDENT_AMBULATORY_CARE_PROVIDER_SITE_OTHER): Admitting: Family Medicine

## 2024-04-08 VITALS — BP 116/78 | HR 72 | Temp 97.9°F | Ht 63.0 in | Wt 213.0 lb

## 2024-04-08 DIAGNOSIS — M25512 Pain in left shoulder: Secondary | ICD-10-CM

## 2024-04-08 DIAGNOSIS — M25519 Pain in unspecified shoulder: Secondary | ICD-10-CM | POA: Insufficient documentation

## 2024-04-08 DIAGNOSIS — M25511 Pain in right shoulder: Secondary | ICD-10-CM

## 2024-04-08 LAB — C-REACTIVE PROTEIN: CRP: <1 mg/dL (ref 0.5–20.0)

## 2024-04-08 LAB — SEDIMENTATION RATE: Sed Rate: 19 mm/h (ref 0–30)

## 2024-04-08 NOTE — Progress Notes (Signed)
 Subjective:    Patient ID: Teresa Mcgee, female    DOB: 1966-08-08, 58 y.o.   MRN: [id number]  HPI  Wt Readings from Last 3 Encounters:  04/08/24 213 lb (96.6 kg)  02/20/24 214 lb (97.1 kg)  01/15/24 216 lb 4 oz (98.1 kg)   37.73 kg/m  Vitals:   04/08/24 1229  BP: 116/78  Pulse: 72  Temp: 97.9 F (36.6 C)  SpO2: 98%    Pt presents with c/o  Bilateral arm pain   Mostly left arm  Top and back of shoulders  Both bother her   Has been about a week  No new activity or exercise or trauma   It hurts to raise arms above her head  Shoulder girdle     Gabapentin   Oxycodone  Meloxicam - every other day    Continues to smoke  Not ready to quit   Lab Results  Component Value Date   ESRSEDRATE 40 01/24/2018   Lab Results  Component Value Date   ANA Negative 03/13/2022   Lab Results  Component Value Date   WBC 6.4 12/29/2022   HGB 13.9 12/29/2022   HCT 42.1 12/29/2022   MCV 89.4 12/29/2022   PLT 187 12/29/2022      Patient Active Problem List   Diagnosis Date Noted   Pain of shoulder girdle 04/08/2024   Bilateral shoulder pain 04/08/2024   Plantar fasciitis of left foot 01/15/2024   Elevated antinuclear antibody (ANA) level 05/28/2023   Morbid obesity (HCC) 05/28/2023   Elevated CK 05/28/2023   Hypomagnesemia 02/21/2022   Hypokalemia 02/21/2022   Fatty liver 02/06/2022   Allergic conjunctivitis 12/01/2021   Elevated LFTs 11/22/2021   Medication monitoring encounter 11/22/2021   Fungal dermatitis 11/22/2021   Routine general medical examination at a health care facility 08/19/2021   Encounter for screening mammogram for breast cancer 08/19/2021   Chronic use of opiate for therapeutic purpose 11/04/2020   Uncomplicated opioid dependence (HCC) 11/04/2020   Ragged cuticle 09/23/2020   Diabetes mellitus treated with injections of non-insulin medication (HCC) 03/30/2019   Pain due to onychomycosis of toenails of both feet 03/03/2019   Cervicalgia  04/22/2018   Chronic musculoskeletal pain 04/22/2018   DDD (degenerative disc disease), thoracic 04/22/2018   DDD (degenerative disc disease), cervical 04/22/2018   Lumbar facet arthropathy (Bilateral) 04/22/2018   Lumbar facet syndrome (Bilateral) 04/22/2018   Chronic low back pain (1ry area of Pain) (Bilateral) (L>R) w/o sciatica 04/22/2018   Spondylosis without myelopathy or radiculopathy, cervical region 04/22/2018   Spondylosis without myelopathy or radiculopathy, lumbosacral region 04/22/2018   DDD (degenerative disc disease), lumbosacral 04/22/2018   Strain of lumbar paraspinal muscle, sequela 04/22/2018   Chronic hip pain (Bilateral) (R>L) 04/22/2018   Chronic sacroiliac joint pain (Left) 04/22/2018   Neurogenic pain 04/22/2018   Osteoarthritis involving multiple joints 04/22/2018   Chronic knee pain (Bilateral) (R>L) 04/22/2018   Chronic upper extremity pain (4th area of Pain) (Bilateral) (L>R) 02/27/2018   Vitamin D  deficiency 02/27/2018   Chronic low back pain (Bilateral) (L>R) w/ sciatica (Bilateral) 01/24/2018   Chronic lower extremity pain (2ry area of Pain) (Bilateral) (L>R) 01/24/2018   Chronic neck pain (3ry area of Pain) (Bilateral) (L>R) 01/24/2018   Chronic pain syndrome 01/24/2018   Long term current use of opiate analgesic 01/24/2018   Disorder of skeletal system 01/24/2018   Nonallopathic lesion of head region 04/25/2012   Constipation 01/11/2012   Chest pain 01/11/2012   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   Numbness  and tingling in both hands 08/25/2011   Muscle spasms of neck 05/23/2011   Fatigue 03/21/2011   Tobacco abuse 05/24/2010   Hypothyroidism 01/25/2010   HYPERTENSION, BENIGN ESSENTIAL 01/25/2010   Hyperlipidemia associated with type 2 diabetes mellitus (HCC) 12/31/2009   DEPRESSION 12/31/2009   GERD 12/31/2009   OSA (obstructive sleep apnea) 12/31/2009   Past Medical History:  Diagnosis Date   Arthritis    Bell's palsy may 2012   Chest pain     Diabetes mellitus without complication (HCC)    GERD (gastroesophageal reflux disease)    H/O hiatal hernia    Hyperlipidemia    Hypertension    Hypothyroidism    Shingles    Shortness of breath    Sleep apnea    uses cpap   Past Surgical History:  Procedure Laterality Date   ABDOMINAL HYSTERECTOMY     CESAREAN SECTION     cesarian     3x   COLONOSCOPY WITH PROPOFOL  N/A 11/27/2019   Procedure: COLONOSCOPY WITH PROPOFOL ;  Surgeon: Therisa Bi, MD;  Location: [facility] ENDOSCOPY;  Service: Gastroenterology;  Laterality: N/A;   ESOPHAGOGASTRODUODENOSCOPY (EGD) WITH PROPOFOL  N/A 07/20/2020   Procedure: ESOPHAGOGASTRODUODENOSCOPY (EGD) WITH PROPOFOL ;  Surgeon: Therisa Bi, MD;  Location: [facility] ENDOSCOPY;  Service: Gastroenterology;  Laterality: N/A;   PARTIAL HYSTERECTOMY  2000   abnormal uterine bleeding   THYROIDECTOMY  1998   TOOTH EXTRACTION     TUBAL LIGATION     Social History   Tobacco Use   Smoking status: Every Day    Current packs/day: 0.50    Average packs/day: 0.5 packs/day for 20.0 years (10.0 ttl pk-yrs)    Types: Cigarettes   Smokeless tobacco: Never   Tobacco comments:    11/13/2023 Patient smokes 1/2 pack cigarettes daily    2-3 cigs daily-09/22/2020  Vaping Use   Vaping status: Never Used  Substance Use Topics   Alcohol use: No   Drug use: No   Family History  Problem Relation Age of Onset   Heart attack Father 63       deceased   Diabetes Brother    Stroke Brother 25       twice   Stroke Sister 45   Diabetes Sister    Heart attack Sister 48   Heart attack Brother 39   Breast cancer Maternal Aunt    Allergies  Allergen Reactions   Statins     myopathy   Current Outpatient Medications on File Prior to Visit  Medication Sig Dispense Refill   Accu-Chek FastClix Lancets MISC USE UP TO 4 TIMES DAILY AS DIRECTED 102 each 1   ACCU-CHEK GUIDE test strip USE UP TO 4 TIMES DAILY AS DIRECTED 100 strip 5   albuterol  (VENTOLIN  HFA) 108 (90 Base) MCG/ACT  inhaler INHALE 2 PUFFS INTO THE LUNGS EVERY 4 HOURS AS NEEDED FOR (COUGH, SHORTNESS OF BREATH OR WHEEZING) 8.5 each 2   amLODipine  (NORVASC ) 10 MG tablet TAKE 1 TABLET BY MOUTH EVERY DAY 90 tablet 2   azelastine  (OPTIVAR ) 0.05 % ophthalmic solution Place 1 drop into both eyes 2 (two) times daily as needed. 6 mL 3   blood glucose meter kit and supplies Dispense based on patient and insurance preference. Use up to four times daily as directed. (FOR ICD-10 E10.9, E11.9). 1 each 0   [employer] D3 125 MCG (5000 UT) capsule TAKE 1 CAPSULE BY MOUTH DAILY WITH BREAKFAST. TAKE ALONG WITH CALCIUM  AND MAGNESIUM .  5   ELDERBERRY PO Take by  mouth.     Evolocumab  (REPATHA  SURECLICK) 140 MG/ML SOAJ INJECT 140 MG INTO THE SKIN EVERY 14 (FOURTEEN) DAYS. 6 mL 3   ezetimibe  (ZETIA ) 10 MG tablet TAKE 1 TABLET BY MOUTH EVERY DAY 90 tablet 2   fluticasone  (FLONASE ) 50 MCG/ACT nasal spray SPRAY 2 SPRAYS INTO EACH NOSTRIL EVERY DAY 48 mL 1   gabapentin  (NEURONTIN ) 100 MG capsule TAKE 1 CAPSULE (100 MG TOTAL) BY MOUTH 2 (TWO) TIMES DAILY AND 3 CAPSULES (300 MG TOTAL) AT BEDTIME. 150 capsule 3   Glucose Blood (BLOOD GLUCOSE TEST STRIPS) STRP 1 each by In Vitro route daily. May substitute to any manufacturer covered by patient's insurance. 100 strip 0   hydrochlorothiazide  (HYDRODIURIL ) 25 MG tablet TAKE 1 TABLET (25 MG TOTAL) BY MOUTH DAILY. AS DIRECTED 90 tablet 1   levocetirizine (XYZAL ) 5 MG tablet TAKE 1 TABLET BY MOUTH EVERY DAY IN THE EVENING 90 tablet 1   levothyroxine  (SYNTHROID ) 112 MCG tablet TAKE 1 TABLET BY MOUTH DAILY BEFORE BREAKFAST. 90 tablet 2   Magnesium  Oxide -Mg Supplement 500 MG CAPS TAKE 1 CAPSULE BY MOUTH TWICE A DAY (AT 8AM & 10PM) 180 capsule 0   meloxicam  (MOBIC ) 15 MG tablet TAKE ONE PILL BY MOUTH WITH FOOD EVERY OTHER DAY AS NEEDED FOR PAIN 45 tablet 1   metFORMIN  (GLUCOPHAGE -XR) 500 MG 24 hr tablet TAKE 1 TABLET BY MOUTH 2 TIMES DAILY WITH A MEAL. 180 tablet 0   Olopatadine HCl 0.2 % SOLN INSTILL 1 DROP  INTO BOTH EYES EVERY DAY AS NEEDED     oxyCODONE  (OXY IR/ROXICODONE ) 5 MG immediate release tablet Take 1 tablet (5 mg total) by mouth every 8 (eight) hours as needed for severe pain (pain score 7-10). Must last 30 days 90 tablet 0   oxyCODONE  (OXY IR/ROXICODONE ) 5 MG immediate release tablet Take 1 tablet (5 mg total) by mouth every 8 (eight) hours as needed for severe pain (pain score 7-10). Must last 30 days 90 tablet 0   [START ON 04/24/2024] oxyCODONE  (OXY IR/ROXICODONE ) 5 MG immediate release tablet Take 1 tablet (5 mg total) by mouth every 8 (eight) hours as needed for severe pain (pain score 7-10). Must last 30 days 90 tablet 0   pantoprazole  (PROTONIX ) 40 MG tablet TAKE 1 TABLET BY MOUTH EVERY DAY 90 tablet 0   potassium chloride  (KLOR-CON ) 10 MEQ tablet TAKE 1 TABLET BY MOUTH EVERY DAY 90 tablet 1   REFRESH CELLUVISC 1 % GEL APPLY 1 DROP TO EYE 3 (THREE) TIMES DAILY.  12   Semaglutide , 2 MG/DOSE, (OZEMPIC , 2 MG/DOSE,) 8 MG/3ML SOPN INJECT 2 MG AS DIRECTED ONCE A WEEK. 3 mL 5   tiZANidine  (ZANAFLEX ) 4 MG tablet TAKE 1 TABLET (4 MG TOTAL) BY MOUTH EVERY 8 (EIGHT) HOURS AS NEEDED FOR MUSCLE SPASMS 270 tablet 1   No current facility-administered medications on file prior to visit.    Review of Systems  Constitutional:  Negative for activity change, appetite change, fatigue, fever and unexpected weight change.  HENT:  Negative for congestion, ear pain, rhinorrhea, sinus pressure and sore throat.   Eyes:  Negative for pain, redness and visual disturbance.  Respiratory:  Negative for cough, shortness of breath and wheezing.   Cardiovascular:  Negative for chest pain and palpitations.  Gastrointestinal:  Negative for abdominal pain, blood in stool, constipation and diarrhea.  Endocrine: Negative for polydipsia and polyuria.  Genitourinary:  Negative for dysuria, frequency and urgency.  Musculoskeletal:  Positive for arthralgias and myalgias. Negative  for back pain and joint swelling.  Skin:   Negative for pallor and rash.  Allergic/Immunologic: Negative for environmental allergies.  Neurological:  Negative for dizziness, syncope and headaches.  Hematological:  Negative for adenopathy. Does not bruise/bleed easily.  Psychiatric/Behavioral:  Negative for decreased concentration and dysphoric mood. The patient is not nervous/anxious.        Objective:   Physical Exam Constitutional:      General: She is not in acute distress.    Appearance: Normal appearance. She is well-developed. She is obese. She is not ill-appearing or diaphoretic.  HENT:     Head: Normocephalic and atraumatic.  Eyes:     Conjunctiva/sclera: Conjunctivae normal.     Pupils: Pupils are equal, round, and reactive to light.  Neck:     Thyroid : No thyromegaly.     Vascular: No carotid bruit or JVD.  Cardiovascular:     Rate and Rhythm: Normal rate and regular rhythm.     Heart sounds: Normal heart sounds.     No gallop.  Pulmonary:     Effort: Pulmonary effort is normal. No respiratory distress.     Breath sounds: Normal breath sounds. No wheezing or rales.     Comments: Diffusely distant bs  Abdominal:     General: There is no distension or abdominal bruit.     Palpations: Abdomen is soft.  Musculoskeletal:     Right shoulder: Tenderness present. No swelling, deformity, effusion or crepitus. Decreased range of motion. Normal strength. Normal pulse.     Left shoulder: Tenderness present. No swelling, deformity, effusion or crepitus. Decreased range of motion. Normal strength. Normal pulse.     Cervical back: Normal range of motion and neck supple.     Right lower leg: No edema.     Left lower leg: No edema.     Comments: Positive hawking and neer tests -causing anterior shoulder pain bilaterally   More pain with external rotation than internal   Unable to abduct either arm past 90 deg due to pain   Lymphadenopathy:     Cervical: No cervical adenopathy.  Skin:    General: Skin is warm and dry.      Coloration: Skin is not pale.     Findings: No rash.  Neurological:     Mental Status: She is alert.     Coordination: Coordination normal.     Deep Tendon Reflexes: Reflexes are normal and symmetric. Reflexes normal.  Psychiatric:        Mood and Affect: Mood normal.           Assessment & Plan:   Problem List Items Addressed This Visit       Other   Pain of shoulder girdle   Shoulder and shoulder girdle pain  Some features of frozen shoulder Also PMR   ESR and CRP labs today       Relevant Orders   Sedimentation Rate   C-reactive protein   Bilateral shoulder pain - Primary   In pt with chronic pain/ arthritis in spine  About a week- limited abduction of shoulders due to pain with no new activity/exercise or trauma  Some generalized shoulder girdle pain as well   ESR and CRP labs ordered to investigate for possible PMR   Exam consistent with early frozen shoulder Given handout on ROM exercises  Encouraged use of heat ice  Continues oxycodone , gabapentin  and meloxicam   Update if not starting to improve in a week or if worsening  Call back and Er precautions noted in detail today        Relevant Orders   Sedimentation Rate   C-reactive protein

## 2024-04-08 NOTE — Assessment & Plan Note (Addendum)
 In pt with chronic pain/ arthritis in spine  About a week- limited abduction of shoulders due to pain with no new activity/exercise or trauma  Some generalized shoulder girdle pain as well   ESR and CRP labs ordered to investigate for possible PMR   Exam consistent with early frozen shoulder Given handout on ROM exercises  Encouraged use of heat ice  Continues oxycodone , gabapentin  and meloxicam   Update if not starting to improve in a week or if worsening   Call back and Er precautions noted in detail today

## 2024-04-08 NOTE — Assessment & Plan Note (Signed)
 Shoulder and shoulder girdle pain  Some features of frozen shoulder Also PMR   ESR and CRP labs today

## 2024-04-08 NOTE — Patient Instructions (Signed)
 Labs today for shoulder girdle pain (checking some inflammatory labs0   We will reach out with result and plan   Try 10 minutes of ice and heat on shoulders to see if this helps Continue current medicines   Try the shoulder range of motion exercises in the handout

## 2024-04-14 NOTE — Progress Notes (Unsigned)
 Teresa Bartolucci T. Areyana Leoni, MD, CAQ Sports Medicine [facility] [address] [place], [address]  Phone: [phone]  FAX: [phone]  Teresa Mcgee - 58 y.o. female  MRN [id number]  Date of Birth: Nov 16, 1965  Date: 04/16/2024  PCP: Randeen Laine LABOR, MD  Referral: Randeen Laine LABOR, MD  No chief complaint on file.  Subjective:   Teresa Mcgee is a 58 y.o. very pleasant female patient with There is no height or weight on file to calculate BMI. who presents with the following:  Discussed the use of AI scribe software for clinical note transcription with the patient, who gave verbal consent to proceed.  Patient presents for evaluation of bilateral shoulder pain.  She dealt Dr. Randeen on April 08, 2024 for bilateral shoulder pain. She does see pain management for chronic back pain.  She is currently on gabapentin , oxycodone , and meloxicam .  Patient had marked restriction of motion bilaterally.  History of Present Illness     Review of Systems is noted in the HPI, as appropriate  Objective:   LMP  (LMP Unknown)   GEN: No acute distress; alert,appropriate. PULM: Breathing comfortably in no respiratory distress PSYCH: Normally interactive.   Physical Exam   Laboratory and Imaging Data:  Assessment and Plan:   No diagnosis found. Assessment & Plan   Medication Management during today's office visit: No orders of the defined types were placed in this encounter.  There are no discontinued medications.  Orders placed today for conditions managed today: No orders of the defined types were placed in this encounter.   Disposition: No follow-ups on file.  [employer] speech-to-text software was used for transcription in this dictation.  Possible transcriptional errors can occur using Animal nutritionist.   Signed,  Jacques DASEN. Rhylen Shaheen, MD   Outpatient Encounter Medications as of 04/16/2024  Medication Sig   Accu-Chek  FastClix Lancets MISC USE UP TO 4 TIMES DAILY AS DIRECTED   ACCU-CHEK GUIDE test strip USE UP TO 4 TIMES DAILY AS DIRECTED   albuterol  (VENTOLIN  HFA) 108 (90 Base) MCG/ACT inhaler INHALE 2 PUFFS INTO THE LUNGS EVERY 4 HOURS AS NEEDED FOR (COUGH, SHORTNESS OF BREATH OR WHEEZING)   amLODipine  (NORVASC ) 10 MG tablet TAKE 1 TABLET BY MOUTH EVERY DAY   azelastine  (OPTIVAR ) 0.05 % ophthalmic solution Place 1 drop into both eyes 2 (two) times daily as needed.   blood glucose meter kit and supplies Dispense based on patient and insurance preference. Use up to four times daily as directed. (FOR ICD-10 E10.9, E11.9).   [employer]S D3 125 MCG (5000 UT) capsule TAKE 1 CAPSULE BY MOUTH DAILY WITH BREAKFAST. TAKE ALONG WITH CALCIUM  AND MAGNESIUM .   ELDERBERRY PO Take by mouth.   Evolocumab  (REPATHA  SURECLICK) 140 MG/ML SOAJ INJECT 140 MG INTO THE SKIN EVERY 14 (FOURTEEN) DAYS.   ezetimibe  (ZETIA ) 10 MG tablet TAKE 1 TABLET BY MOUTH EVERY DAY   fluticasone  (FLONASE ) 50 MCG/ACT nasal spray SPRAY 2 SPRAYS INTO EACH NOSTRIL EVERY DAY   gabapentin  (NEURONTIN ) 100 MG capsule TAKE 1 CAPSULE (100 MG TOTAL) BY MOUTH 2 (TWO) TIMES DAILY AND 3 CAPSULES (300 MG TOTAL) AT BEDTIME.   Glucose Blood (BLOOD GLUCOSE TEST STRIPS) STRP 1 each by In Vitro route daily. May substitute to any manufacturer covered by patient's insurance.   hydrochlorothiazide  (HYDRODIURIL ) 25 MG tablet TAKE 1 TABLET (25 MG TOTAL) BY MOUTH DAILY. AS DIRECTED   levocetirizine (XYZAL ) 5 MG tablet TAKE 1  TABLET BY MOUTH EVERY DAY IN THE EVENING   levothyroxine  (SYNTHROID ) 112 MCG tablet TAKE 1 TABLET BY MOUTH DAILY BEFORE BREAKFAST.   Magnesium  Oxide -Mg Supplement 500 MG CAPS TAKE 1 CAPSULE BY MOUTH TWICE A DAY (AT 8AM & 10PM)   meloxicam  (MOBIC ) 15 MG tablet TAKE ONE PILL BY MOUTH WITH FOOD EVERY OTHER DAY AS NEEDED FOR PAIN   metFORMIN  (GLUCOPHAGE -XR) 500 MG 24 hr tablet TAKE 1 TABLET BY MOUTH 2 TIMES DAILY WITH A MEAL.   Olopatadine HCl 0.2 % SOLN INSTILL 1  DROP INTO BOTH EYES EVERY DAY AS NEEDED   oxyCODONE  (OXY IR/ROXICODONE ) 5 MG immediate release tablet Take 1 tablet (5 mg total) by mouth every 8 (eight) hours as needed for severe pain (pain score 7-10). Must last 30 days   oxyCODONE  (OXY IR/ROXICODONE ) 5 MG immediate release tablet Take 1 tablet (5 mg total) by mouth every 8 (eight) hours as needed for severe pain (pain score 7-10). Must last 30 days   [START ON 04/24/2024] oxyCODONE  (OXY IR/ROXICODONE ) 5 MG immediate release tablet Take 1 tablet (5 mg total) by mouth every 8 (eight) hours as needed for severe pain (pain score 7-10). Must last 30 days   pantoprazole  (PROTONIX ) 40 MG tablet TAKE 1 TABLET BY MOUTH EVERY DAY   potassium chloride  (KLOR-CON ) 10 MEQ tablet TAKE 1 TABLET BY MOUTH EVERY DAY   REFRESH CELLUVISC 1 % GEL APPLY 1 DROP TO EYE 3 (THREE) TIMES DAILY.   Semaglutide , 2 MG/DOSE, (OZEMPIC , 2 MG/DOSE,) 8 MG/3ML SOPN INJECT 2 MG AS DIRECTED ONCE A WEEK.   tiZANidine  (ZANAFLEX ) 4 MG tablet TAKE 1 TABLET (4 MG TOTAL) BY MOUTH EVERY 8 (EIGHT) HOURS AS NEEDED FOR MUSCLE SPASMS   No facility-administered encounter medications on file as of 04/16/2024.

## 2024-04-16 ENCOUNTER — Ambulatory Visit (INDEPENDENT_AMBULATORY_CARE_PROVIDER_SITE_OTHER): Admitting: Family Medicine

## 2024-04-16 ENCOUNTER — Encounter: Payer: Self-pay | Admitting: Family Medicine

## 2024-04-16 ENCOUNTER — Ambulatory Visit (INDEPENDENT_AMBULATORY_CARE_PROVIDER_SITE_OTHER)
Admission: RE | Admit: 2024-04-16 | Discharge: 2024-04-16 | Disposition: A | Discharge status: Home / Self Care | Source: Ambulatory Visit | Attending: Family Medicine | Admitting: Family Medicine

## 2024-04-16 VITALS — BP 110/68 | HR 71 | Temp 97.6°F | Ht 63.0 in | Wt 214.0 lb

## 2024-04-16 DIAGNOSIS — M25512 Pain in left shoulder: Secondary | ICD-10-CM | POA: Diagnosis not present

## 2024-04-16 DIAGNOSIS — M19012 Primary osteoarthritis, left shoulder: Secondary | ICD-10-CM | POA: Diagnosis not present

## 2024-04-16 DIAGNOSIS — G8929 Other chronic pain: Secondary | ICD-10-CM

## 2024-04-16 DIAGNOSIS — M7502 Adhesive capsulitis of left shoulder: Secondary | ICD-10-CM

## 2024-04-16 DIAGNOSIS — M7501 Adhesive capsulitis of right shoulder: Secondary | ICD-10-CM | POA: Diagnosis not present

## 2024-04-16 DIAGNOSIS — M25511 Pain in right shoulder: Secondary | ICD-10-CM

## 2024-04-16 MED ORDER — PREDNISONE 20 MG PO TABS
ORAL_TABLET | ORAL | 0 refills | Status: DC
Start: 2024-04-16 — End: 2024-06-04

## 2024-04-22 DIAGNOSIS — H5203 Hypermetropia, bilateral: Secondary | ICD-10-CM | POA: Diagnosis not present

## 2024-04-22 DIAGNOSIS — E119 Type 2 diabetes mellitus without complications: Secondary | ICD-10-CM | POA: Diagnosis not present

## 2024-04-22 LAB — HM DIABETES EYE EXAM

## 2024-04-24 ENCOUNTER — Ambulatory Visit: Payer: Self-pay | Admitting: Family Medicine

## 2024-04-30 ENCOUNTER — Other Ambulatory Visit: Payer: Self-pay | Admitting: Family Medicine

## 2024-04-30 DIAGNOSIS — M62838 Other muscle spasm: Secondary | ICD-10-CM

## 2024-04-30 DIAGNOSIS — G8929 Other chronic pain: Secondary | ICD-10-CM

## 2024-04-30 NOTE — Telephone Encounter (Signed)
 Last filled on 11/08/23 #270 tab/ 1 refill  CPE scheduled 09/09/23

## 2024-05-14 ENCOUNTER — Other Ambulatory Visit: Payer: Self-pay | Admitting: Family Medicine

## 2024-05-20 ENCOUNTER — Encounter: Admitting: Nurse Practitioner

## 2024-05-22 ENCOUNTER — Encounter: Payer: Self-pay | Admitting: Nurse Practitioner

## 2024-05-22 ENCOUNTER — Ambulatory Visit: Attending: Nurse Practitioner | Admitting: Nurse Practitioner

## 2024-05-22 DIAGNOSIS — Z79891 Long term (current) use of opiate analgesic: Secondary | ICD-10-CM | POA: Insufficient documentation

## 2024-05-22 DIAGNOSIS — M542 Cervicalgia: Secondary | ICD-10-CM | POA: Insufficient documentation

## 2024-05-22 DIAGNOSIS — M533 Sacrococcygeal disorders, not elsewhere classified: Secondary | ICD-10-CM | POA: Insufficient documentation

## 2024-05-22 DIAGNOSIS — M25552 Pain in left hip: Secondary | ICD-10-CM | POA: Insufficient documentation

## 2024-05-22 DIAGNOSIS — M79604 Pain in right leg: Secondary | ICD-10-CM | POA: Insufficient documentation

## 2024-05-22 DIAGNOSIS — M79605 Pain in left leg: Secondary | ICD-10-CM | POA: Diagnosis not present

## 2024-05-22 DIAGNOSIS — M545 Low back pain, unspecified: Secondary | ICD-10-CM | POA: Diagnosis not present

## 2024-05-22 DIAGNOSIS — M47816 Spondylosis without myelopathy or radiculopathy, lumbar region: Secondary | ICD-10-CM | POA: Insufficient documentation

## 2024-05-22 DIAGNOSIS — Z79899 Other long term (current) drug therapy: Secondary | ICD-10-CM

## 2024-05-22 DIAGNOSIS — F1120 Opioid dependence, uncomplicated: Secondary | ICD-10-CM | POA: Diagnosis present

## 2024-05-22 DIAGNOSIS — G894 Chronic pain syndrome: Secondary | ICD-10-CM | POA: Diagnosis not present

## 2024-05-22 DIAGNOSIS — G8929 Other chronic pain: Secondary | ICD-10-CM | POA: Diagnosis not present

## 2024-05-22 DIAGNOSIS — M25551 Pain in right hip: Secondary | ICD-10-CM | POA: Insufficient documentation

## 2024-05-22 DIAGNOSIS — M25562 Pain in left knee: Secondary | ICD-10-CM | POA: Diagnosis not present

## 2024-05-22 DIAGNOSIS — M25561 Pain in right knee: Secondary | ICD-10-CM | POA: Diagnosis not present

## 2024-05-22 MED ORDER — OXYCODONE HCL 5 MG PO TABS: 5.0000 mg | ORAL_TABLET | Freq: Three times a day (TID) | ORAL | 0 refills | Status: DC | PRN

## 2024-05-22 NOTE — Progress Notes (Signed)
 PROVIDER NOTE: Interpretation of information contained herein should be left to medically-trained personnel. Specific patient instructions are provided elsewhere under Patient Instructions section of medical record. This document was created in part using AI and STT-dictation technology, any transcriptional errors that may result from this process are unintentional.  Patient: Teresa Mcgee  Service: E/M   PCP: Randeen Laine LABOR, MD  DOB: 1966-02-14  DOS: 05/22/2024  Provider: Emmy MARLA Blanch, NP  MRN: [id number]  Delivery: Face-to-face  Specialty: Interventional Pain Management  Type: Established Patient  Setting: Ambulatory outpatient facility  Specialty designation: 09  Referring Prov.: Tower, Laine LABOR, MD  Location: Outpatient office facility       History of present illness (HPI) [name], a 58 y.o. year old female, is here today because of her low back pain. [name]'s primary complain today is low back pain.   Pertinent problems: [name]  muscle spasm of neck; numbness and tingling in both hands; chronic low back pain (bilateral) (L>R) w/sciatica; chronic lower extremity pain (2ry area of pain) (bilateral) (L>R); chronic neck pain (3ry area of pain) (bilateral) (L>R); and chronic pain syndrome on their pertinent problem list.  Pain Assessment: Severity of Chronic pain is reported as a 8 /10. Location: Back Lower/denies. Onset: More than a month ago. Quality: Aching, Pressure, Discomfort (Body). Timing: Constant. Modifying factor(s): rest, medications. Vitals:  height is 5' 3 (1.6 m) and weight is 212 lb (96.2 kg). Her temperature is 97.2 F (36.2 C) (abnormal). Her blood pressure is 132/80 and her pulse is 79. Her respiration is 16 and oxygen saturation is 99%.  BMI: Estimated body mass index is 37.55 kg/m as calculated from the following:   Height as of this encounter: 5' 3 (1.6 m).   Weight as of this encounter: 212 lb (96.2 kg).  Last encounter: 02/20/2024. Last  procedure: Visit date not found.  Reason for encounter: medication management. The patient indicates doing well with the current medication regimen. No adverse reaction or side effects reported to the medication.  No additional concerns or symptoms were reported at this time.  Pharmacotherapy Assessment   Oxycodone  (Oxy IR/Roxicodone ) 5 mg immediate release tablet as needed for pain. MME=22  Monitoring: [place] PMP: PDMP reviewed during this encounter.       Pharmacotherapy: No side-effects or adverse reactions reported. Compliance: No problems identified. Effectiveness: Clinically acceptable.  Dorlene Montie FALCON, RN  05/22/2024 11:22 AM  Sign when Signing Visit Nursing Pain Medication Assessment:  Safety precautions to be maintained throughout the outpatient stay will include: orient to surroundings, keep bed in low position, maintain call bell within reach at all times, provide assistance with transfer out of bed and ambulation.  Medication Inspection Compliance: Pill count conducted under aseptic conditions, in front of the patient. Neither the pills nor the bottle was removed from the patient's sight at any time. Once count was completed pills were immediately returned to the patient in their original bottle.  Medication: Morphine  IR Pill/Patch Count: 8 of 90 pills/patches remain Pill/Patch Appearance: Markings consistent with prescribed medication Bottle Appearance: Standard pharmacy container. Clearly labeled. Filled Date: 97 / 12 / 2025 Last Medication intake:  Today    UDS:  Summary  Date Value Ref Range Status  02/20/2024 FINAL  Final    Comment:    ==================================================================== [employer] 13 (MW) ==================================================================== Test  Result       Flag       Units  Drug Present and Declared for Prescription Verification   Oxycodone                       138          EXPECTED    ng/mg creat   Oxymorphone                    503          EXPECTED   ng/mg creat   Noroxycodone                   221          EXPECTED   ng/mg creat   Noroxymorphone                 65           EXPECTED   ng/mg creat    Sources of oxycodone  are scheduled prescription medications.    Oxymorphone, noroxycodone, and noroxymorphone are expected    metabolites of oxycodone . Oxymorphone is also available as a    scheduled prescription medication.  ==================================================================== Test                      Result    Flag   Units      Ref Range   Creatinine              104              mg/dL      >=79 ==================================================================== Declared Medications:  The flagging and interpretation on this report are based on the  following declared medications.  Unexpected results may arise from  inaccuracies in the declared medications.   **Note: The testing scope of this panel includes these medications:   Oxycodone  (Roxicodone )   **Note: The testing scope of this panel does not include the  following reported medications:   Albuterol  (Ventolin  HFA)  Amlodipine  (Norvasc )  Azelastine  (Optivar )  Evolocumab  (Repatha )  Eye Drops  Ezetimibe  (Zetia )  Fluticasone  (Flonase )  Gabapentin  (Neurontin )  Hydrochlorothiazide  (Hydrodiuril )  Levocetirizine (Xyzal )  Levothyroxine  (Synthroid )  Magnesium  (Mag-Ox)  Meloxicam  (Mobic )  Metformin  (Glucophage )  Naloxone  (Narcan )  Olopatadine  Pantoprazole  (Protonix )  Potassium (Klor-Con )  Prednisone  (Deltasone )  Semaglutide  (Ozempic )  Supplement  Tizanidine  (Zanaflex )  Vitamin D3 ==================================================================== For clinical consultation, please call [phone]. ====================================================================     No results found for: CBDTHCR No results found for: D8THCCBX No results found for: D9THCCBX  ROS   Constitutional: Denies any fever or chills Gastrointestinal: No reported hemesis, hematochezia, vomiting, or acute GI distress Musculoskeletal: Low back pain Neurological: No reported episodes of acute onset apraxia, aphasia, dysarthria, agnosia, amnesia, paralysis, loss of coordination, or loss of consciousness  Medication Review  Accu-Chek FastClix Lancets, BLOOD GLUCOSE TEST STRIPS, Carboxymethylcellulose Sod PF, Cholecalciferol, Elderberry, Evolocumab , Magnesium  Oxide -Mg Supplement, Olopatadine HCl, Semaglutide  (2 MG/DOSE), albuterol , amLODipine , azelastine , blood glucose meter kit and supplies, ezetimibe , fluticasone , gabapentin , glucose blood, hydrochlorothiazide , levocetirizine, levothyroxine , meloxicam , metFORMIN , oxyCODONE , pantoprazole , potassium chloride , predniSONE , and tiZANidine   History Review  Allergy: [name] is allergic to statins. Drug: [name]  reports no history of drug use. Alcohol:  reports no history of alcohol use. Tobacco:  reports that she has been smoking cigarettes. She has a 10 pack-year smoking history. She has never used smokeless tobacco. Social: [name]  reports that she has  been smoking cigarettes. She has a 10 pack-year smoking history. She has never used smokeless tobacco. She reports that she does not drink alcohol and does not use drugs. Medical:  has a past medical history of Arthritis, Bell's palsy (may 2012), Chest pain, Diabetes mellitus without complication (HCC), GERD (gastroesophageal reflux disease), H/O hiatal hernia, Hyperlipidemia, Hypertension, Hypothyroidism, Shingles, Shortness of breath, and Sleep apnea. Surgical: [name]  has a past surgical history that includes Thyroidectomy (1998); cesarian; Partial hysterectomy (2000); Cesarean section; Tubal ligation; Tooth extraction; Abdominal hysterectomy; Colonoscopy with propofol  (N/A, 11/27/2019); and Esophagogastroduodenoscopy (egd) with propofol  (N/A, 07/20/2020). Family: family history  includes Breast cancer in her maternal aunt; Diabetes in her brother and sister; Heart attack (age of onset: 38) in her brother; Heart attack (age of onset: 79) in her sister; Heart attack (age of onset: 4) in her father; Stroke (age of onset: 70) in her brother; Stroke (age of onset: 70) in her sister.  Laboratory Chemistry Profile   Renal Lab Results  Component Value Date   BUN 5 (L) 09/24/2023   CREATININE 0.91 09/24/2023   BCR 8 (L) 01/24/2018   GFR 70.21 09/24/2023   GFRAA >60 05/25/2018   GFRNONAA >60 12/29/2022    Hepatic Lab Results  Component Value Date   AST 36 09/10/2023   ALT 36 (H) 09/10/2023   ALBUMIN 4.5 09/10/2023   ALKPHOS 70 09/10/2023   LIPASE 25 05/25/2018    Electrolytes Lab Results  Component Value Date   NA 141 09/24/2023   K 3.6 09/24/2023   CL 98 09/24/2023   CALCIUM  9.7 09/24/2023   MG 1.7 02/21/2022    Bone Lab Results  Component Value Date   VD25OH 68.72 08/19/2021   25OHVITD1 9.3 (L) 01/24/2018   25OHVITD2 <1.0 01/24/2018   25OHVITD3 9.3 01/24/2018    Inflammation (CRP: Acute Phase) (ESR: Chronic Phase) Lab Results  Component Value Date   CRP <1.0 04/08/2024   ESRSEDRATE 19 04/08/2024         Note: Above Lab results reviewed.  Recent Imaging Review  DG Shoulder Right EXAM: 1 VIEW XRAY OF THE SHOULDER 04/16/2024 11:39:12 AM  COMPARISON: None available.  CLINICAL HISTORY: Loss of motion and pain.  FINDINGS:  BONES AND JOINTS: Glenohumeral joint is normally aligned. No acute fracture or dislocation. The [facility] joint is unremarkable in appearance.  SOFT TISSUES: Calcific densities overlying the humeral head appear outside the humeral head. Visualized lung is unremarkable.  IMPRESSION: 1. Calcific densities overlying the humeral head, appearing outside the humeral head.  Electronically signed by: Dorethia Molt MD 04/24/2024 04:29 PM EDT RP Workstation: [id number] DG Shoulder Left EXAM: 1 VIEW XRAY OF THE LEFT  SHOULDER 04/16/2024 11:39:12 AM  COMPARISON: None available.  CLINICAL HISTORY: Loss of motion and pain.  FINDINGS:  BONES AND JOINTS: Glenohumeral joint is normally aligned. No acute fracture or dislocation. Mild glenohumeral degenerative changes. Mild acromioclavicular joint degenerative changes.  SOFT TISSUES: No abnormal calcifications. Visualized lung is unremarkable.  IMPRESSION: 1. Mild acromioclavicular and glenohumeral degenerative changes.  Electronically signed by: Katheleen Faes MD 04/24/2024 04:28 PM EDT RP Workstation: [id number] Note: Reviewed        Physical Exam  Vitals: BP 132/80   Pulse 79   Temp (!) 97.2 F (36.2 C)   Resp 16   Ht 5' 3 (1.6 m)   Wt 212 lb (96.2 kg)   LMP  (LMP Unknown)   SpO2 99%   BMI 37.55 kg/m  BMI: Estimated body mass index is  37.55 kg/m as calculated from the following:   Height as of this encounter: 5' 3 (1.6 m).   Weight as of this encounter: 212 lb (96.2 kg). Ideal: Ideal body weight: 52.4 kg (115 lb 8.3 oz) Adjusted ideal body weight: 69.9 kg (154 lb 1.8 oz) General appearance: Well nourished, well developed, and well hydrated. In no apparent acute distress Mental status: Alert, oriented x 3 (person, place, & time)       Respiratory: No evidence of acute respiratory distress Eyes: PERLA  Musculoskeletal: +LBP Assessment   Diagnosis Status  1. Chronic pain syndrome   2. Chronic low back pain (1ry area of Pain) (Bilateral) (L>R) w/o sciatica   3. Chronic use of opiate for therapeutic purpose   4. Lumbar facet syndrome (Bilateral)   5. Uncomplicated opioid dependence (HCC)   6. Chronic lower extremity pain (2ry area of Pain) (Bilateral) (L>R)   7. Chronic neck pain (3ry area of Pain) (Bilateral) (L>R)   8. Chronic knee pain (Bilateral) (R>L)   9. Chronic hip pain (Bilateral) (R>L)   10. Chronic sacroiliac joint pain (Left)    Controlled Controlled Controlled   Updated Problems: No problems updated.  Plan  of Care  Problem-specific:  Assessment and Plan Chronic pain syndrome: Patient's pain control with oxycodone , will continue on current medication regimen.  Prescribing drug monitoring (PDMP) reviewed, findings consistent with the use of prescribed medication and no evidence of narcotic misuse or abuse.  Urine drug screening (UDS) up-to-date and consistent with the use of prescribed medication.  Schedule follow-up in 90 days for medication management.  Chronic low back pain: The patient continues on chronic pain management, reports persistent pain primarily localized to the low back on both sides, however current pain medication regimen helps to manage her pain level and functional on daily basis.  [name] has a current medication list which includes the following long-term medication(s): albuterol , amlodipine , ezetimibe , fluticasone , gabapentin , hydrochlorothiazide , levocetirizine, levothyroxine , magnesium  oxide -mg supplement, meloxicam , metformin , pantoprazole , potassium chloride , [START ON 05/25/2024] oxycodone , [START ON 06/24/2024] oxycodone , and [START ON 07/24/2024] oxycodone .  Pharmacotherapy (Medications Ordered): Meds ordered this encounter  Medications   oxyCODONE  (OXY IR/ROXICODONE ) 5 MG immediate release tablet    Sig: Take 1 tablet (5 mg total) by mouth every 8 (eight) hours as needed for severe pain (pain score 7-10). Must last 30 days    Dispense:  90 tablet    Refill:  0    DO NOT: delete (not duplicate); no partial-fill (will deny script to complete), no refill request (F/U required). DISPENSE: 1 day early if closed on fill date. WARN: No CNS-depressants within 8 hrs of med.   oxyCODONE  (OXY IR/ROXICODONE ) 5 MG immediate release tablet    Sig: Take 1 tablet (5 mg total) by mouth every 8 (eight) hours as needed for severe pain (pain score 7-10). Must last 30 days    Dispense:  90 tablet    Refill:  0    DO NOT: delete (not duplicate); no partial-fill (will deny  script to complete), no refill request (F/U required). DISPENSE: 1 day early if closed on fill date. WARN: No CNS-depressants within 8 hrs of med.   oxyCODONE  (OXY IR/ROXICODONE ) 5 MG immediate release tablet    Sig: Take 1 tablet (5 mg total) by mouth every 8 (eight) hours as needed for severe pain (pain score 7-10). Must last 30 days    Dispense:  90 tablet    Refill:  0    DO  NOT: delete (not duplicate); no partial-fill (will deny script to complete), no refill request (F/U required). DISPENSE: 1 day early if closed on fill date. WARN: No CNS-depressants within 8 hrs of med.   Orders:  No orders of the defined types were placed in this encounter.       Return in about 3 months (around 08/22/2024) for (F2F), (MM), Emmy Blanch NP.    Recent Visits No visits were found meeting these conditions. Showing recent visits within past 90 days and meeting all other requirements Today's Visits Date Type Provider Dept  05/22/24 Office Visit Nikeria Kalman K, NP [facility]  Showing today's visits and meeting all other requirements Future Appointments No visits were found meeting these conditions. Showing future appointments within next 90 days and meeting all other requirements  I discussed the assessment and treatment plan with the patient. The patient was provided an opportunity to ask questions and all were answered. The patient agreed with the plan and demonstrated an understanding of the instructions.  Patient advised to call back or seek an in-person evaluation if the symptoms or condition worsens.  Duration of encounter: 30 minutes.  Total time on encounter, as per AMA guidelines included both the face-to-face and non-face-to-face time personally spent by the physician and/or other qualified health care professional(s) on the day of the encounter (includes time in activities that require the physician or other qualified health care professional and does not include time in  activities normally performed by clinical staff). Physician's time may include the following activities when performed: Preparing to see the patient (e.g., pre-charting review of records, searching for previously ordered imaging, lab work, and nerve conduction tests) Review of prior analgesic pharmacotherapies. Reviewing PMP Interpreting ordered tests (e.g., lab work, imaging, nerve conduction tests) Performing post-procedure evaluations, including interpretation of diagnostic procedures Obtaining and/or reviewing separately obtained history Performing a medically appropriate examination and/or evaluation Counseling and educating the patient/family/caregiver Ordering medications, tests, or procedures Referring and communicating with other health care professionals (when not separately reported) Documenting clinical information in the electronic or other health record Independently interpreting results (not separately reported) and communicating results to the patient/ family/caregiver Care coordination (not separately reported)  Note by: Chrsitopher Wik K Marguerite Barba, NP (TTS and AI technology used. I apologize for any typographical errors that were not detected and corrected.) Date: 05/22/2024; Time: 12:58 PM

## 2024-05-22 NOTE — Progress Notes (Signed)
 Nursing Pain Medication Assessment:  Safety precautions to be maintained throughout the outpatient stay will include: orient to surroundings, keep bed in low position, maintain call bell within reach at all times, provide assistance with transfer out of bed and ambulation.  Medication Inspection Compliance: Pill count conducted under aseptic conditions, in front of the patient. Neither the pills nor the bottle was removed from the patient's sight at any time. Once count was completed pills were immediately returned to the patient in their original bottle.  Medication: Morphine  IR Pill/Patch Count: 8 of 90 pills/patches remain Pill/Patch Appearance: Markings consistent with prescribed medication Bottle Appearance: Standard pharmacy container. Clearly labeled. Filled Date: 53 / 12 / 2025 Last Medication intake:  Today

## 2024-05-22 NOTE — Patient Instructions (Signed)

## 2024-05-24 ENCOUNTER — Other Ambulatory Visit: Payer: Self-pay | Admitting: Family Medicine

## 2024-05-25 ENCOUNTER — Other Ambulatory Visit: Payer: Self-pay | Admitting: Family Medicine

## 2024-05-27 ENCOUNTER — Encounter: Payer: Self-pay | Admitting: Pharmacist

## 2024-05-27 DIAGNOSIS — Z823 Family history of stroke: Secondary | ICD-10-CM | POA: Diagnosis not present

## 2024-05-27 DIAGNOSIS — E1151 Type 2 diabetes mellitus with diabetic peripheral angiopathy without gangrene: Secondary | ICD-10-CM | POA: Diagnosis not present

## 2024-05-27 DIAGNOSIS — K760 Fatty (change of) liver, not elsewhere classified: Secondary | ICD-10-CM | POA: Diagnosis not present

## 2024-05-27 DIAGNOSIS — M62838 Other muscle spasm: Secondary | ICD-10-CM | POA: Diagnosis not present

## 2024-05-27 DIAGNOSIS — K754 Autoimmune hepatitis: Secondary | ICD-10-CM | POA: Diagnosis not present

## 2024-05-27 DIAGNOSIS — M545 Low back pain, unspecified: Secondary | ICD-10-CM | POA: Diagnosis not present

## 2024-05-27 DIAGNOSIS — Z79890 Hormone replacement therapy (postmenopausal): Secondary | ICD-10-CM | POA: Diagnosis not present

## 2024-05-27 DIAGNOSIS — I10 Essential (primary) hypertension: Secondary | ICD-10-CM | POA: Diagnosis not present

## 2024-05-27 DIAGNOSIS — E876 Hypokalemia: Secondary | ICD-10-CM | POA: Diagnosis not present

## 2024-05-27 DIAGNOSIS — E785 Hyperlipidemia, unspecified: Secondary | ICD-10-CM | POA: Diagnosis not present

## 2024-05-27 DIAGNOSIS — M1990 Unspecified osteoarthritis, unspecified site: Secondary | ICD-10-CM | POA: Diagnosis not present

## 2024-05-27 DIAGNOSIS — Z79891 Long term (current) use of opiate analgesic: Secondary | ICD-10-CM | POA: Diagnosis not present

## 2024-05-27 DIAGNOSIS — K219 Gastro-esophageal reflux disease without esophagitis: Secondary | ICD-10-CM | POA: Diagnosis not present

## 2024-05-27 DIAGNOSIS — E1142 Type 2 diabetes mellitus with diabetic polyneuropathy: Secondary | ICD-10-CM | POA: Diagnosis not present

## 2024-05-27 DIAGNOSIS — J40 Bronchitis, not specified as acute or chronic: Secondary | ICD-10-CM | POA: Diagnosis not present

## 2024-05-27 DIAGNOSIS — J302 Other seasonal allergic rhinitis: Secondary | ICD-10-CM | POA: Diagnosis not present

## 2024-05-27 DIAGNOSIS — M810 Age-related osteoporosis without current pathological fracture: Secondary | ICD-10-CM | POA: Diagnosis not present

## 2024-05-27 DIAGNOSIS — F325 Major depressive disorder, single episode, in full remission: Secondary | ICD-10-CM | POA: Diagnosis not present

## 2024-05-27 DIAGNOSIS — R011 Cardiac murmur, unspecified: Secondary | ICD-10-CM | POA: Diagnosis not present

## 2024-05-27 DIAGNOSIS — E039 Hypothyroidism, unspecified: Secondary | ICD-10-CM | POA: Diagnosis not present

## 2024-05-27 DIAGNOSIS — G4733 Obstructive sleep apnea (adult) (pediatric): Secondary | ICD-10-CM | POA: Diagnosis not present

## 2024-05-27 DIAGNOSIS — Z6838 Body mass index (BMI) 38.0-38.9, adult: Secondary | ICD-10-CM | POA: Diagnosis not present

## 2024-05-27 NOTE — Progress Notes (Signed)
 Pharmacy Quality Measure Review  Statin Use in Persons with Diabetes (SUPD) Not addressed in 2025.  Next PCP 2026. Future message sent to consider exclusion code:  ICD-10-CM code exceptions:  Rhabdomyolysis or myopathy G72.0            Drug-induced myopathy  G72.89          Other specified myopathies  G72.9            Myopathy, unspecified  M62.82         Rhabdomyolysis  T46.6X5A      Adverse effect of antihyperlipidemic and antiarteriosclerotic drugs, initial encounter    Future Appointments  Date Time Provider Department Center  09/08/2024 11:30 AM Tower, Laine LABOR, MD [facility] [address]  09/10/2024 11:30 AM [facility] ANNUAL WELLNESS VISIT 1 [facility] [address]

## 2024-05-29 ENCOUNTER — Ambulatory Visit (INDEPENDENT_AMBULATORY_CARE_PROVIDER_SITE_OTHER): Admitting: Podiatry

## 2024-05-29 ENCOUNTER — Encounter: Payer: Self-pay | Admitting: Podiatry

## 2024-05-29 DIAGNOSIS — E119 Type 2 diabetes mellitus without complications: Secondary | ICD-10-CM | POA: Diagnosis not present

## 2024-05-29 DIAGNOSIS — M79674 Pain in right toe(s): Secondary | ICD-10-CM

## 2024-05-29 DIAGNOSIS — M79675 Pain in left toe(s): Secondary | ICD-10-CM | POA: Diagnosis not present

## 2024-05-29 DIAGNOSIS — B351 Tinea unguium: Secondary | ICD-10-CM | POA: Diagnosis not present

## 2024-06-02 NOTE — Progress Notes (Signed)
  Subjective:  Patient ID: Teresa Mcgee, female    DOB: 04/13/1966,  MRN: [id number]  Teresa Mcgee presents to clinic today for for annual diabetic foot examination and painful thick toenails that are difficult to trim. Pain interferes with ambulation. Aggravating factors include wearing enclosed shoe gear. Pain is relieved with periodic professional debridement.  Chief Complaint  Patient presents with   Toe Pain    Diabetic foot care. The patient reports that  Dr. Randeen is her PCP . A1c was 5.8. Last office visit was in  Sept. 2025. She did not have an update medical list with her.   New problem(s): None.   PCP is Tower, Laine LABOR, MD.  Allergies  Allergen Reactions   Statins     myopathy    Review of Systems: Negative except as noted in the HPI.  Objective: No changes noted in today's physical examination. There were no vitals filed for this visit. Teresa Mcgee is a pleasant 58 y.o. female in NAD. AAO x 3.   Diabetic foot exam was performed with the following findings:   Normal sensation of 10g monofilament Vascular Examination: CFT <3 seconds b/l. DP/PT pulses faintly palpable b/l. Digital hair absent.  Skin temperature gradient warm to warm b/l. No pain with calf compression. No ischemia or gangrene. No cyanosis or clubbing noted b/l. No edema noted b/l LE.   Neurological Examination: Sensation grossly intact b/l with 10 gram monofilament. Vibratory sensation intact b/l.   Dermatological Examination: Pedal skin warm and supple b/l.   No open wounds. No interdigital macerations.  Toenails 1-5 b/l thick, discolored, elongated with subungual debris and pain on dorsal palpation.    No corns, calluses, nor porokeratotic lesions.  Musculoskeletal Examination: Muscle strength 5/5 to all lower extremity muscle groups bilaterally. Pes planus deformity noted bilateral LE.Teresa Mcgee No pain, crepitus or joint limitation noted with ROM b/l LE.  Patient ambulates independently  without assistive aids.  Radiographs: None     Assessment/Plan: 1. Pain due to onychomycosis of toenails of both feet   2. Type 2 diabetes mellitus without complication, without long-term current use of insulin (HCC)   Diabetic foot examination performed today. All patient's and/or POA's questions/concerns addressed on today's visit. Toenails 1-5 b/l debrided in length and girth without incident. Continue foot and shoe inspections daily. Monitor blood glucose per PCP/Endocrinologist's recommendations. Continue soft, supportive shoe gear daily. Report any pedal injuries to medical professional. Call office if there are any questions/concerns. -Patient/POA to call should there be question/concern in the interim.   Return in about 3 months (around 08/29/2024).  Teresa Mcgee, DPM      [facility] LOCATION: [address] [place] [address]                   Office [phone]   [facility] LOCATION: [address] [place], [place] [address] Office [phone]

## 2024-06-03 NOTE — Progress Notes (Signed)
 Teresa Mcneese T. Caliber Landess, MD, CAQ Sports Medicine [facility] at [facility] [address] [place], [address]  Phone: [phone]  FAX: [phone]  Teresa Mcgee - 58 y.o. female  MRN [id number]  Date of Birth: Nov 20, 1965  Date: 06/04/2024  PCP: Randeen Laine LABOR, MD  Referral: Randeen Laine LABOR, MD  No chief complaint on file.  Subjective:   Teresa Mcgee is a 58 y.o. very pleasant female patient with There is no height or weight on file to calculate BMI. who presents with the following:  Discussed the use of AI scribe software for clinical note transcription with the patient, who gave verbal consent to proceed.  Patient is here for follow-up bilateral frozen shoulder.  The last time I saw her, she had marked restriction of motion in both shoulders, significant prolonged pain, and I started her on [facility] frozen shoulder protocol and gave her some oral steroids. History of Present Illness     Review of Systems is noted in the HPI, as appropriate  Objective:   LMP  (LMP Unknown)   GEN: No acute distress; alert,appropriate. PULM: Breathing comfortably in no respiratory distress PSYCH: Normally interactive.   Laboratory and Imaging Data:  Assessment and Plan:   No diagnosis found. Assessment & Plan   Medication Management during today's office visit: No orders of the defined types were placed in this encounter.  There are no discontinued medications.  Orders placed today for conditions managed today: No orders of the defined types were placed in this encounter.   Disposition: No follow-ups on file.  Dragon [employer] One speech-to-text software was used for transcription in this dictation.  Possible transcriptional errors can occur using Animal nutritionist.   Signed,  Jacques DASEN. Daniya Aramburo, MD   Outpatient Encounter Medications as of 06/04/2024  Medication Sig   Accu-Chek FastClix Lancets MISC USE UP TO 4 TIMES DAILY AS DIRECTED    ACCU-CHEK GUIDE test strip USE UP TO 4 TIMES DAILY AS DIRECTED   albuterol  (VENTOLIN  HFA) 108 (90 Base) MCG/ACT inhaler INHALE 2 PUFFS INTO THE LUNGS EVERY 4 HOURS AS NEEDED FOR (COUGH, SHORTNESS OF BREATH OR WHEEZING)   amLODipine  (NORVASC ) 10 MG tablet TAKE 1 TABLET BY MOUTH EVERY DAY   azelastine  (OPTIVAR ) 0.05 % ophthalmic solution Place 1 drop into both eyes 2 (two) times daily as needed.   blood glucose meter kit and supplies Dispense based on patient and insurance preference. Use up to four times daily as directed. (FOR ICD-10 E10.9, E11.9).   [employer] D3 125 MCG (5000 UT) capsule TAKE 1 CAPSULE BY MOUTH DAILY WITH BREAKFAST. TAKE ALONG WITH CALCIUM  AND MAGNESIUM .   ELDERBERRY PO Take by mouth.   Evolocumab  (REPATHA  SURECLICK) 140 MG/ML SOAJ INJECT 140 MG INTO THE SKIN EVERY 14 (FOURTEEN) DAYS.   ezetimibe  (ZETIA ) 10 MG tablet TAKE 1 TABLET BY MOUTH EVERY DAY   fluticasone  (FLONASE ) 50 MCG/ACT nasal spray SPRAY 2 SPRAYS INTO EACH NOSTRIL EVERY DAY   gabapentin  (NEURONTIN ) 100 MG capsule TAKE 1 CAPSULE (100 MG TOTAL) BY MOUTH 2 (TWO) TIMES DAILY AND 3 CAPSULES (300 MG TOTAL) AT BEDTIME.   Glucose Blood (BLOOD GLUCOSE TEST STRIPS) STRP 1 each by In Vitro route daily. May substitute to any manufacturer covered by patient's insurance.   hydrochlorothiazide  (HYDRODIURIL ) 25 MG tablet TAKE 1 TABLET (25 MG TOTAL) BY MOUTH DAILY. AS DIRECTED   levocetirizine (XYZAL ) 5 MG tablet TAKE 1 TABLET BY MOUTH EVERY DAY IN THE EVENING  levothyroxine  (SYNTHROID ) 112 MCG tablet TAKE 1 TABLET BY MOUTH DAILY BEFORE BREAKFAST.   Magnesium  Oxide -Mg Supplement 500 MG CAPS TAKE 1 CAPSULE BY MOUTH TWICE A DAY (AT 8AM & 10PM)   meloxicam  (MOBIC ) 15 MG tablet TAKE ONE PILL BY MOUTH WITH FOOD EVERY OTHER DAY AS NEEDED FOR PAIN   metFORMIN  (GLUCOPHAGE -XR) 500 MG 24 hr tablet TAKE 1 TABLET BY MOUTH 2 TIMES DAILY WITH A MEAL.   Olopatadine HCl 0.2 % SOLN INSTILL 1 DROP INTO BOTH EYES EVERY DAY AS NEEDED   oxyCODONE  (OXY  IR/ROXICODONE ) 5 MG immediate release tablet Take 1 tablet (5 mg total) by mouth every 8 (eight) hours as needed for severe pain (pain score 7-10). Must last 30 days   [START ON 06/24/2024] oxyCODONE  (OXY IR/ROXICODONE ) 5 MG immediate release tablet Take 1 tablet (5 mg total) by mouth every 8 (eight) hours as needed for severe pain (pain score 7-10). Must last 30 days   [START ON 07/24/2024] oxyCODONE  (OXY IR/ROXICODONE ) 5 MG immediate release tablet Take 1 tablet (5 mg total) by mouth every 8 (eight) hours as needed for severe pain (pain score 7-10). Must last 30 days   pantoprazole  (PROTONIX ) 40 MG tablet TAKE 1 TABLET BY MOUTH EVERY DAY   potassium chloride  (KLOR-CON ) 10 MEQ tablet TAKE 1 TABLET BY MOUTH EVERY DAY   predniSONE  (DELTASONE ) 20 MG tablet 2 tabs po daily for 5 days, then 1 tab po daily for 5 days   REFRESH CELLUVISC 1 % GEL APPLY 1 DROP TO EYE 3 (THREE) TIMES DAILY.   Semaglutide , 2 MG/DOSE, (OZEMPIC , 2 MG/DOSE,) 8 MG/3ML SOPN INJECT 2 MG AS DIRECTED ONCE A WEEK.   tiZANidine  (ZANAFLEX ) 4 MG tablet TAKE 1 TABLET (4 MG TOTAL) BY MOUTH EVERY 8 (EIGHT) HOURS AS NEEDED FOR MUSCLE SPASMS   No facility-administered encounter medications on file as of 06/04/2024.

## 2024-06-04 ENCOUNTER — Ambulatory Visit (INDEPENDENT_AMBULATORY_CARE_PROVIDER_SITE_OTHER): Admitting: Family Medicine

## 2024-06-04 ENCOUNTER — Encounter: Payer: Self-pay | Admitting: Family Medicine

## 2024-06-04 VITALS — BP 100/70 | HR 72 | Temp 97.3°F | Ht 63.0 in | Wt 216.0 lb

## 2024-06-04 DIAGNOSIS — M7501 Adhesive capsulitis of right shoulder: Secondary | ICD-10-CM | POA: Diagnosis not present

## 2024-06-04 DIAGNOSIS — E119 Type 2 diabetes mellitus without complications: Secondary | ICD-10-CM | POA: Diagnosis not present

## 2024-06-04 DIAGNOSIS — M7502 Adhesive capsulitis of left shoulder: Secondary | ICD-10-CM

## 2024-06-04 DIAGNOSIS — Z7985 Long-term (current) use of injectable non-insulin antidiabetic drugs: Secondary | ICD-10-CM | POA: Diagnosis not present

## 2024-06-08 ENCOUNTER — Other Ambulatory Visit: Payer: Self-pay | Admitting: Family Medicine

## 2024-06-08 DIAGNOSIS — M150 Primary generalized (osteo)arthritis: Secondary | ICD-10-CM

## 2024-06-10 NOTE — Telephone Encounter (Signed)
 Last filled on 12/24/23 #45 tab/ 1 refill  CPE 09/08/24

## 2024-07-08 ENCOUNTER — Other Ambulatory Visit: Payer: Self-pay | Admitting: Family Medicine

## 2024-07-08 DIAGNOSIS — I10 Essential (primary) hypertension: Secondary | ICD-10-CM

## 2024-07-17 ENCOUNTER — Other Ambulatory Visit: Payer: Self-pay | Admitting: Family Medicine

## 2024-07-17 DIAGNOSIS — M792 Neuralgia and neuritis, unspecified: Secondary | ICD-10-CM

## 2024-07-17 NOTE — Telephone Encounter (Signed)
 Last filled on 03/24/24 #150 caps/ 3 refills  CPE scheduled 09/08/24

## 2024-07-20 ENCOUNTER — Other Ambulatory Visit: Payer: Self-pay | Admitting: Family Medicine

## 2024-07-20 DIAGNOSIS — J302 Other seasonal allergic rhinitis: Secondary | ICD-10-CM

## 2024-07-24 ENCOUNTER — Other Ambulatory Visit: Payer: Self-pay | Admitting: Family Medicine

## 2024-07-24 NOTE — Telephone Encounter (Signed)
 Last filled on 02/12/24 #3 mL/ 5 refills   CPE 09/08/24

## 2024-07-25 ENCOUNTER — Other Ambulatory Visit: Payer: Self-pay | Admitting: Family Medicine

## 2024-08-08 ENCOUNTER — Other Ambulatory Visit: Payer: Self-pay | Admitting: Family Medicine

## 2024-08-19 NOTE — Progress Notes (Signed)
 PROVIDER NOTE: Interpretation of information contained herein should be left to medically-trained personnel. Specific patient instructions are provided elsewhere under Patient Instructions section of medical record. This document was created in part using AI and STT-dictation technology, any transcriptional errors that may result from this process are unintentional.  Patient: Teresa Mcgee  Service: E/M   PCP: Randeen Laine LABOR, MD  DOB: 08/06/1966  DOS: 08/21/2024  Provider: Emmy MARLA Blanch, NP  MRN: [id number]  Delivery: Face-to-face  Specialty: Interventional Pain Management  Type: Established Patient  Setting: Ambulatory outpatient facility  Specialty designation: 09  Referring Prov.: Tower, Laine LABOR, MD  Location: Outpatient office facility       History of present illness (HPI) [name], a 59 y.o. year old female, is here today because of her neck pain. [name] primary complain today is Neck Pain  Pertinent problems: [name] has Chronic low back pain (Bilateral) (L>R) w/ sciatica (Bilateral); Chronic lower extremity pain (2ry area of Pain) (Bilateral) (L>R); Chronic neck pain (3ry area of Pain) (Bilateral) (L>R); Chronic pain syndrome; Long term current use of opiate analgesic; Disorder of skeletal system; Chronic upper extremity pain (4th area of Pain) (Bilateral) (L>R); Cervicalgia; Chronic musculoskeletal pain; DDD (degenerative disc disease), thoracic; DDD (degenerative disc disease), cervical; Lumbar facet arthropathy (Bilateral); Lumbar facet syndrome (Bilateral); Chronic low back pain (1ry area of Pain) (Bilateral) (L>R) w/o sciatica; Spondylosis without myelopathy or radiculopathy, cervical region; Spondylosis without myelopathy or radiculopathy, lumbosacral region; DDD (degenerative disc disease), lumbosacral; Strain of lumbar paraspinal muscle, sequela; Chronic hip pain (Bilateral) (R>L); Chronic sacroiliac joint pain (Left); Neurogenic pain; Osteoarthritis involving  multiple joints; Chronic knee pain (Bilateral) (R>L); Chronic use of opiate for therapeutic purpose; and Uncomplicated opioid dependence (HCC) on their pertinent problem list.  Pain Assessment: Severity of Chronic pain is reported as a 7 /10. Location: Neck Mid/Radiates into shoulders and lower back. Onset:  . Quality:  . Timing:  . Modifying factor(s):  SABRA Vitals:  height is 5' 3 (1.6 m) and weight is 216 lb (98 kg). Her temporal temperature is 97.6 F (36.4 C). Her blood pressure is 137/84 and her pulse is 66. Her respiration is 18 and oxygen saturation is 100%.  BMI: Estimated body mass index is 38.26 kg/m as calculated from the following:   Height as of this encounter: 5' 3 (1.6 m).   Weight as of this encounter: 216 lb (98 kg).  Last encounter: 05/22/2024. Last procedure: Visit date not found.  Reason for encounter: medication management. The patient indicates doing well with current pain medication regimen. No side effects or adverse reaction reported to medication.   Discussed the use of AI scribe software for clinical note transcription with the patient, who gave verbal consent to proceed.  History of Present Illness   Teresa Mcgee is a 59 year old female with arthritis who presents with worsening morning pain and neck pain.  She experiences significant arthritis pain in the morning upon waking, which improves as the day progresses and with movement. She manages her symptoms with tizanidine  at night.  She has neck pain primarily on the right side, described as tightness in the back of her neck. She has previously received trigger point injections, which provided only temporary relief. She manages her neck pain with oxycodone  5 mg and reports no side effects from her medications.  She mentions numbness in her feet when they dangle, affecting her ability to perform certain movements, such as sitting in high chairs at  restaurants.     Pharmacotherapy Assessment   Oxycodone  (Oxy  IR/Roxicodone ) 5 mg immediate release tablet as needed for pain. MME=22  Monitoring: [place] PMP: PDMP reviewed during this encounter.       Pharmacotherapy: No side-effects or adverse reactions reported. Compliance: No problems identified. Effectiveness: Clinically acceptable.  Teresa Mcgee, [place]  08/21/2024  8:11 AM  Sign when Signing Visit Nursing Pain Medication Assessment:  Safety precautions to be maintained throughout the outpatient stay will include: orient to surroundings, keep bed in low position, maintain call bell within reach at all times, provide assistance with transfer out of bed and ambulation.  Medication Inspection Compliance: Pill count conducted under aseptic conditions, in front of the patient. Neither the pills nor the bottle was removed from the patient's sight at any time. Once count was completed pills were immediately returned to the patient in their original bottle.  Medication: Oxycodone  IR Pill/Patch Count: 11 of 90 pills/patches remain Pill/Patch Appearance: Markings consistent with prescribed medication Bottle Appearance: Standard pharmacy container. Clearly labeled. Filled Date: 90 / 12 / 2025 Last Medication intake:  Today    UDS:  Summary  Date Value Ref Range Status  02/20/2024 FINAL  Final    Comment:    ==================================================================== [employer] Select 13 (MW) ==================================================================== Test                             Result       Flag       Units  Drug Present and Declared for Prescription Verification   Oxycodone                       138          EXPECTED   ng/mg creat   Oxymorphone                    503          EXPECTED   ng/mg creat   Noroxycodone                   221          EXPECTED   ng/mg creat   Noroxymorphone                 65           EXPECTED   ng/mg creat    Sources of oxycodone  are scheduled prescription medications.    Oxymorphone, noroxycodone, and  noroxymorphone are expected    metabolites of oxycodone . Oxymorphone is also available as a    scheduled prescription medication.  ==================================================================== Test                      Result    Flag   Units      Ref Range   Creatinine              104              mg/dL      >=79 ==================================================================== Declared Medications:  The flagging and interpretation on this report are based on the  following declared medications.  Unexpected results may arise from  inaccuracies in the declared medications.   **Note: The testing scope of this panel includes these medications:   Oxycodone  (Roxicodone )   **Note: The testing scope of this panel does not include the  following reported medications:   Albuterol  (  Ventolin  HFA)  Amlodipine  (Norvasc )  Azelastine  (Optivar )  Evolocumab  (Repatha )  Eye Drops  Ezetimibe  (Zetia )  Fluticasone  (Flonase )  Gabapentin  (Neurontin )  Hydrochlorothiazide  (Hydrodiuril )  Levocetirizine (Xyzal )  Levothyroxine  (Synthroid )  Magnesium  (Mag-Ox)  Meloxicam  (Mobic )  Metformin  (Glucophage )  Naloxone  (Narcan )  Olopatadine  Pantoprazole  (Protonix )  Potassium (Klor-Con )  Prednisone  (Deltasone )  Semaglutide  (Ozempic )  Supplement  Tizanidine  (Zanaflex )  Vitamin D3 ==================================================================== For clinical consultation, please call [phone]. ====================================================================     No results found for: CBDTHCR No results found for: D8THCCBX No results found for: D9THCCBX  ROS  Constitutional: Denies any fever or chills Gastrointestinal: No reported hemesis, hematochezia, vomiting, or acute GI distress Musculoskeletal: neck pain, stiffness  Neurological: No reported episodes of acute onset apraxia, aphasia, dysarthria, agnosia, amnesia, paralysis, loss of coordination, or loss of  consciousness  Medication Review  Accu-Chek FastClix Lancets, BLOOD GLUCOSE TEST STRIPS, Carboxymethylcellulose Sod PF, Cholecalciferol, Elderberry, Evolocumab , Magnesium  Oxide -Mg Supplement, Olopatadine HCl, Semaglutide  (2 MG/DOSE), albuterol , amLODipine , azelastine , blood glucose meter kit and supplies, ezetimibe , fluticasone , gabapentin , glucose blood, hydrochlorothiazide , levocetirizine, levothyroxine , meloxicam , metFORMIN , oxyCODONE , pantoprazole , potassium chloride , and tiZANidine   History Review  Allergy: Ms. Kneisley is allergic to statins. Drug: Ms. Rauda  reports no history of drug use. Alcohol:  reports no history of alcohol use. Tobacco:  reports that she has been smoking cigarettes. She has a 10 pack-year smoking history. She has never used smokeless tobacco. Social: Ms. Helm  reports that she has been smoking cigarettes. She has a 10 pack-year smoking history. She has never used smokeless tobacco. She reports that she does not drink alcohol and does not use drugs. Medical:  has a past medical history of Arthritis, Bell's palsy (may 2012), Chest pain, Diabetes mellitus without complication (HCC), GERD (gastroesophageal reflux disease), H/O hiatal hernia, Hyperlipidemia, Hypertension, Hypothyroidism, Shingles, Shortness of breath, and Sleep apnea. Surgical: Ms. Gubler  has a past surgical history that includes Thyroidectomy (1998); cesarian; Partial hysterectomy (2000); Cesarean section; Tubal ligation; Tooth extraction; Abdominal hysterectomy; Colonoscopy with propofol  (N/A, 11/27/2019); and Esophagogastroduodenoscopy (egd) with propofol  (N/A, 07/20/2020). Family: family history includes Breast cancer in her maternal aunt; Diabetes in her brother and sister; Heart attack (age of onset: 100) in her brother; Heart attack (age of onset: 17) in her sister; Heart attack (age of onset: 31) in her father; Stroke (age of onset: 71) in her brother; Stroke (age of onset: 84) in her  sister.  Laboratory Chemistry Profile   Renal Lab Results  Component Value Date   BUN 5 (L) 09/24/2023   CREATININE 0.91 09/24/2023   BCR 8 (L) 01/24/2018   GFR 70.21 09/24/2023   GFRAA >60 05/25/2018   GFRNONAA >60 12/29/2022    Hepatic Lab Results  Component Value Date   AST 36 09/10/2023   ALT 36 (H) 09/10/2023   ALBUMIN 4.5 09/10/2023   ALKPHOS 70 09/10/2023   LIPASE 25 05/25/2018    Electrolytes Lab Results  Component Value Date   NA 141 09/24/2023   K 3.6 09/24/2023   CL 98 09/24/2023   CALCIUM  9.7 09/24/2023   MG 1.7 02/21/2022    Bone Lab Results  Component Value Date   VD25OH 68.72 08/19/2021   25OHVITD1 9.3 (L) 01/24/2018   25OHVITD2 <1.0 01/24/2018   25OHVITD3 9.3 01/24/2018    Inflammation (CRP: Acute Phase) (ESR: Chronic Phase) Lab Results  Component Value Date   CRP <1.0 04/08/2024   ESRSEDRATE 19 04/08/2024         Note: Above Lab results reviewed.  Recent  Imaging Review  DG Shoulder Right EXAM: 1 VIEW XRAY OF THE SHOULDER 04/16/2024 11:39:12 AM  COMPARISON: None available.  CLINICAL HISTORY: Loss of motion and pain.  FINDINGS:  BONES AND JOINTS: Glenohumeral joint is normally aligned. No acute fracture or dislocation. The [facility] joint is unremarkable in appearance.  SOFT TISSUES: Calcific densities overlying the humeral head appear outside the humeral head. Visualized lung is unremarkable.  IMPRESSION: 1. Calcific densities overlying the humeral head, appearing outside the humeral head.  Electronically signed by: Dorethia Molt MD 04/24/2024 04:29 PM EDT RP Workstation: [id number] DG Shoulder Left EXAM: 1 VIEW XRAY OF THE LEFT SHOULDER 04/16/2024 11:39:12 AM  COMPARISON: None available.  CLINICAL HISTORY: Loss of motion and pain.  FINDINGS:  BONES AND JOINTS: Glenohumeral joint is normally aligned. No acute fracture or dislocation. Mild glenohumeral degenerative changes. Mild acromioclavicular joint degenerative  changes.  SOFT TISSUES: No abnormal calcifications. Visualized lung is unremarkable.  IMPRESSION: 1. Mild acromioclavicular and glenohumeral degenerative changes.  Electronically signed by: Katheleen Faes MD 04/24/2024 04:28 PM EDT RP Workstation: [id number] Note: Reviewed        Physical Exam  Vitals: BP 137/84 (BP Location: Right Arm, Patient Position: Sitting, Cuff Size: Normal)   Pulse 66   Temp 97.6 F (36.4 C) (Temporal)   Resp 18   Ht 5' 3 (1.6 m)   Wt 216 lb (98 kg)   LMP  (LMP Unknown)   SpO2 100%   BMI 38.26 kg/m  BMI: Estimated body mass index is 38.26 kg/m as calculated from the following:   Height as of this encounter: 5' 3 (1.6 m).   Weight as of this encounter: 216 lb (98 kg). Ideal: Ideal body weight: 52.4 kg (115 lb 8.3 oz) Adjusted ideal body weight: 70.6 kg (155 lb 11.4 oz) General appearance: Well nourished, well developed, and well hydrated. In no apparent acute distress Mental status: Alert, oriented x 3 (person, place, & time)       Respiratory: No evidence of acute respiratory distress Eyes: PERLA  Musculoskeletal: Neck pain (stiffness) Assessment   Diagnosis Status  1. Chronic neck pain (3ry area of Pain) (Bilateral) (L>R)   2. Uncomplicated opioid dependence (HCC)   3. Pharmacologic therapy   4. Chronic low back pain (1ry area of Pain) (Bilateral) (L>R) w/o sciatica   5. Chronic lower extremity pain (2ry area of Pain) (Bilateral) (L>R)   6. Chronic knee pain (Bilateral) (R>L)   7. Chronic hip pain (Bilateral) (R>L)   8. Chronic pain syndrome   9. Chronic sacroiliac joint pain (Left)   10. Lumbar facet syndrome (Bilateral)   11. Chronic use of opiate for therapeutic purpose    Controlled Controlled Controlled   Updated Problems: No problems updated.  Plan of Care  Problem-specific:  Assessment and Plan    Chronic pain syndrome Morning exacerbation likely due to arthritis. Pain improves with activity. No medication side  effects. - Continue oxycodone  5 mg for pain management. - Encouraged morning exercises and warm-up routines to alleviate stiffness. - Advised to perform stretching exercise to improve movement  Chronic pain of both lower extremities Chronic pain in both lower extremities. Numbness when dangling feet. Pain managed with current regimen. - Continue current pain management regimen. - Advised against dangling feet due to numbness. - Encouraged movement and exercises to improve circulation and reduce numbness.  Chronic neck pain Localized to the right side, described as tightness. Previous triple point injection provided minimal relief. Pain managed with medication. - Continue current pain  management regimen. - Encouraged movement and exercises to improve neck mobility.   Pharmacologic therapy: Patient's pain control with oxycodone , will continue on current medication regimen.  Prescribing drug monitoring (PDMP) reviewed, findings consistent with the use of prescribed medication and no evidence of narcotic misuse or abuse.  Urine drug screening (UDS) up-to-date and consistent with the use of prescribed medication.  No side effects or adverse reaction reported to medication. Schedule follow-up in 90 days for medication management.       Ms. LYNDY RUSSMAN has a current medication list which includes the following long-term medication(s): albuterol , amlodipine , ezetimibe , fluticasone , gabapentin , hydrochlorothiazide , levocetirizine, levothyroxine , magnesium  oxide -mg supplement, meloxicam , metformin , pantoprazole , potassium chloride , [START ON 08/24/2024] oxycodone , [START ON 09/23/2024] oxycodone , and [START ON 10/23/2024] oxycodone .  Pharmacotherapy (Medications Ordered): Meds ordered this encounter  Medications   oxyCODONE  (OXY IR/ROXICODONE ) 5 MG immediate release tablet    Sig: Take 1 tablet (5 mg total) by mouth every 8 (eight) hours as needed for severe pain (pain score 7-10). Must last 30  days    Dispense:  90 tablet    Refill:  0    DO NOT: delete (not duplicate); no partial-fill (will deny script to complete), no refill request (F/U required). DISPENSE: 1 day early if closed on fill date. WARN: No CNS-depressants within 8 hrs of med.   oxyCODONE  (OXY IR/ROXICODONE ) 5 MG immediate release tablet    Sig: Take 1 tablet (5 mg total) by mouth every 8 (eight) hours as needed for severe pain (pain score 7-10). Must last 30 days    Dispense:  90 tablet    Refill:  0    DO NOT: delete (not duplicate); no partial-fill (will deny script to complete), no refill request (F/U required). DISPENSE: 1 day early if closed on fill date. WARN: No CNS-depressants within 8 hrs of med.   oxyCODONE  (OXY IR/ROXICODONE ) 5 MG immediate release tablet    Sig: Take 1 tablet (5 mg total) by mouth every 8 (eight) hours as needed for severe pain (pain score 7-10). Must last 30 days    Dispense:  90 tablet    Refill:  0    DO NOT: delete (not duplicate); no partial-fill (will deny script to complete), no refill request (F/U required). DISPENSE: 1 day early if closed on fill date. WARN: No CNS-depressants within 8 hrs of med.   Orders:  No orders of the defined types were placed in this encounter.       Return in about 3 months (around 11/19/2024) for (F2F), (MM), Emmy Blanch NP.    Recent Visits No visits were found meeting these conditions. Showing recent visits within past 90 days and meeting all other requirements Today's Visits Date Type Provider Dept  08/21/24 Office Visit Anahi Belmar K, NP [facility]  Showing today's visits and meeting all other requirements Future Appointments Date Type Provider Dept  11/17/24 Appointment Jalexus Brett K, NP [facility]  Showing future appointments within next 90 days and meeting all other requirements  I discussed the assessment and treatment plan with the patient. The patient was provided an opportunity to ask questions and all were  answered. The patient agreed with the plan and demonstrated an understanding of the instructions.  Patient advised to call back or seek an in-person evaluation if the symptoms or condition worsens.  I personally spent a total of 30 minutes in the care of the patient today including preparing to see the patient, getting/reviewing separately obtained history,  performing a medically appropriate exam/evaluation, counseling and educating, placing orders, referring and communicating with other health care professionals, documenting clinical information in the EHR, independently interpreting results, communicating results, and coordinating care.   Note by: Hollace Michelli K Natsuko Kelsay, NP (TTS and AI technology used. I apologize for any typographical errors that were not detected and corrected.) Date: 08/21/2024; Time: 8:33 AM

## 2024-08-21 ENCOUNTER — Encounter: Payer: Self-pay | Admitting: Nurse Practitioner

## 2024-08-21 ENCOUNTER — Ambulatory Visit: Attending: Nurse Practitioner | Admitting: Nurse Practitioner

## 2024-08-21 VITALS — BP 137/84 | HR 66 | Temp 97.6°F | Resp 18 | Ht 63.0 in | Wt 216.0 lb

## 2024-08-21 DIAGNOSIS — M47816 Spondylosis without myelopathy or radiculopathy, lumbar region: Secondary | ICD-10-CM | POA: Diagnosis present

## 2024-08-21 DIAGNOSIS — Z79891 Long term (current) use of opiate analgesic: Secondary | ICD-10-CM | POA: Diagnosis present

## 2024-08-21 DIAGNOSIS — G8929 Other chronic pain: Secondary | ICD-10-CM | POA: Insufficient documentation

## 2024-08-21 DIAGNOSIS — M25551 Pain in right hip: Secondary | ICD-10-CM | POA: Insufficient documentation

## 2024-08-21 DIAGNOSIS — M533 Sacrococcygeal disorders, not elsewhere classified: Secondary | ICD-10-CM | POA: Insufficient documentation

## 2024-08-21 DIAGNOSIS — M542 Cervicalgia: Secondary | ICD-10-CM | POA: Diagnosis not present

## 2024-08-21 DIAGNOSIS — M25552 Pain in left hip: Secondary | ICD-10-CM | POA: Diagnosis present

## 2024-08-21 DIAGNOSIS — M25561 Pain in right knee: Secondary | ICD-10-CM | POA: Insufficient documentation

## 2024-08-21 DIAGNOSIS — M545 Low back pain, unspecified: Secondary | ICD-10-CM | POA: Insufficient documentation

## 2024-08-21 DIAGNOSIS — M79605 Pain in left leg: Secondary | ICD-10-CM | POA: Diagnosis not present

## 2024-08-21 DIAGNOSIS — M25562 Pain in left knee: Secondary | ICD-10-CM | POA: Insufficient documentation

## 2024-08-21 DIAGNOSIS — M79604 Pain in right leg: Secondary | ICD-10-CM | POA: Diagnosis not present

## 2024-08-21 DIAGNOSIS — F1120 Opioid dependence, uncomplicated: Secondary | ICD-10-CM | POA: Insufficient documentation

## 2024-08-21 DIAGNOSIS — G894 Chronic pain syndrome: Secondary | ICD-10-CM | POA: Diagnosis present

## 2024-08-21 DIAGNOSIS — Z79899 Other long term (current) drug therapy: Secondary | ICD-10-CM | POA: Insufficient documentation

## 2024-08-21 MED ORDER — OXYCODONE HCL 5 MG PO TABS: 5.0000 mg | ORAL_TABLET | Freq: Three times a day (TID) | ORAL | 0 refills | Status: AC | PRN

## 2024-08-21 NOTE — Progress Notes (Signed)
 Nursing Pain Medication Assessment:  Safety precautions to be maintained throughout the outpatient stay will include: orient to surroundings, keep bed in low position, maintain call bell within reach at all times, provide assistance with transfer out of bed and ambulation.  Medication Inspection Compliance: Pill count conducted under aseptic conditions, in front of the patient. Neither the pills nor the bottle was removed from the patient's sight at any time. Once count was completed pills were immediately returned to the patient in their original bottle.  Medication: Oxycodone  IR Pill/Patch Count: 11 of 90 pills/patches remain Pill/Patch Appearance: Markings consistent with prescribed medication Bottle Appearance: Standard pharmacy container. Clearly labeled. Filled Date: 107 / 12 / 2025 Last Medication intake:  Today

## 2024-08-21 NOTE — Patient Instructions (Signed)

## 2024-08-22 ENCOUNTER — Other Ambulatory Visit: Payer: Self-pay | Admitting: Family Medicine

## 2024-08-27 ENCOUNTER — Telehealth: Payer: Self-pay | Admitting: *Deleted

## 2024-08-27 NOTE — Telephone Encounter (Signed)
 Pt notified of Dr. Graham comments. Pt has her CPE on 09/08/24 will discuss getting vaccines then but will just keep an eye on her sxs.

## 2024-08-27 NOTE — Telephone Encounter (Signed)
 Not much we can do to prevent pneumonia (also depends on type) besides get a pneumonia vaccine If she would like one (prenar 20) please schedule a nurse visit   Alert us  if you develop respiratory symptoms of any kind or if fever    Thanks for the heads up

## 2024-08-27 NOTE — Telephone Encounter (Signed)
 Copied from CRM [id number]. Topic: Clinical - Medical Advice >> Aug 26, 2024  5:17 PM Hadassah PARAS wrote: Reason for CRM:  Pt took a friend to the hospital for chest x ray and was diagnosed with Pneumonia. Pt is afraid and would like to be proactive as she was close to someone who had pneomonia. Please advise pt on #[phone]

## 2024-08-31 ENCOUNTER — Telehealth: Payer: Self-pay | Admitting: Family Medicine

## 2024-08-31 DIAGNOSIS — E119 Type 2 diabetes mellitus without complications: Secondary | ICD-10-CM

## 2024-08-31 DIAGNOSIS — I10 Essential (primary) hypertension: Secondary | ICD-10-CM

## 2024-08-31 DIAGNOSIS — E039 Hypothyroidism, unspecified: Secondary | ICD-10-CM

## 2024-08-31 DIAGNOSIS — E1169 Type 2 diabetes mellitus with other specified complication: Secondary | ICD-10-CM

## 2024-08-31 NOTE — Telephone Encounter (Signed)
-----   Message from Veva JINNY Ferrari sent at 08/21/2024 10:53 AM EST ----- Regarding: Lab orders for Mon, 1.19.26 Patient is scheduled for CPX labs, please order future labs, Thanks , Veva

## 2024-09-01 ENCOUNTER — Other Ambulatory Visit

## 2024-09-01 ENCOUNTER — Emergency Department

## 2024-09-01 ENCOUNTER — Ambulatory Visit: Payer: Self-pay

## 2024-09-01 ENCOUNTER — Emergency Department
Admission: EM | Admit: 2024-09-01 | Discharge: 2024-09-01 | Disposition: A | Discharge status: Home / Self Care | Attending: Emergency Medicine | Admitting: Emergency Medicine

## 2024-09-01 ENCOUNTER — Other Ambulatory Visit: Payer: Self-pay

## 2024-09-01 ENCOUNTER — Ambulatory Visit: Payer: Self-pay | Admitting: Family Medicine

## 2024-09-01 DIAGNOSIS — K760 Fatty (change of) liver, not elsewhere classified: Secondary | ICD-10-CM | POA: Insufficient documentation

## 2024-09-01 DIAGNOSIS — E876 Hypokalemia: Secondary | ICD-10-CM | POA: Diagnosis not present

## 2024-09-01 DIAGNOSIS — E039 Hypothyroidism, unspecified: Secondary | ICD-10-CM

## 2024-09-01 DIAGNOSIS — E1169 Type 2 diabetes mellitus with other specified complication: Secondary | ICD-10-CM

## 2024-09-01 DIAGNOSIS — E119 Type 2 diabetes mellitus without complications: Secondary | ICD-10-CM

## 2024-09-01 DIAGNOSIS — I10 Essential (primary) hypertension: Secondary | ICD-10-CM | POA: Diagnosis not present

## 2024-09-01 DIAGNOSIS — R109 Unspecified abdominal pain: Secondary | ICD-10-CM | POA: Diagnosis present

## 2024-09-01 DIAGNOSIS — R1011 Right upper quadrant pain: Secondary | ICD-10-CM

## 2024-09-01 DIAGNOSIS — Z7985 Long-term (current) use of injectable non-insulin antidiabetic drugs: Secondary | ICD-10-CM

## 2024-09-01 DIAGNOSIS — E785 Hyperlipidemia, unspecified: Secondary | ICD-10-CM

## 2024-09-01 LAB — COMPREHENSIVE METABOLIC PANEL WITH GFR
ALT: 17 U/L (ref 3–35)
ALT: 20 U/L (ref 0–44)
AST: 26 U/L (ref 5–37)
AST: 32 U/L (ref 15–41)
Albumin: 4.6 g/dL (ref 3.5–5.2)
Albumin: 4.5 g/dL (ref 3.5–5.0)
Alkaline Phosphatase: 86 U/L (ref 39–117)
Alkaline Phosphatase: 99 U/L (ref 38–126)
Anion gap: 14 (ref 5–15)
BUN: 9 mg/dL (ref 6–23)
BUN: 10 mg/dL (ref 6–20)
CO2: 34 meq/L — ABNORMAL HIGH (ref 19–32)
CO2: 27 mmol/L (ref 22–32)
Calcium: 10.2 mg/dL (ref 8.4–10.5)
Calcium: 9.5 mg/dL (ref 8.9–10.3)
Chloride: 99 meq/L (ref 96–112)
Chloride: 101 mmol/L (ref 98–111)
Creatinine, Ser: 0.96 mg/dL (ref 0.40–1.20)
Creatinine, Ser: 1.02 mg/dL — ABNORMAL HIGH (ref 0.44–1.00)
GFR, Estimated: >60 mL/min
GFR: 65.41 mL/min
Glucose, Bld: 94 mg/dL (ref 70–99)
Glucose, Bld: 112 mg/dL — ABNORMAL HIGH (ref 70–99)
Potassium: 3.6 meq/L (ref 3.5–5.1)
Potassium: 3.3 mmol/L — ABNORMAL LOW (ref 3.5–5.1)
Sodium: 142 meq/L (ref 135–145)
Sodium: 141 mmol/L (ref 135–145)
Total Bilirubin: 0.6 mg/dL (ref 0.2–1.2)
Total Bilirubin: 0.4 mg/dL (ref 0.0–1.2)
Total Protein: 7.8 g/dL (ref 6.0–8.3)
Total Protein: 7.9 g/dL (ref 6.5–8.1)

## 2024-09-01 LAB — URINALYSIS, ROUTINE W REFLEX MICROSCOPIC
Bilirubin Urine: NEGATIVE
Glucose, UA: NEGATIVE mg/dL
Ketones, ur: NEGATIVE mg/dL
Leukocytes,Ua: NEGATIVE
Nitrite: NEGATIVE
Protein, ur: NEGATIVE mg/dL
Specific Gravity, Urine: 1.018 (ref 1.005–1.030)
Squamous Epithelial / HPF: 0 /HPF (ref 0–5)
pH: 5 (ref 5.0–8.0)

## 2024-09-01 LAB — CBC
HCT: 43.1 % (ref 36.0–46.0)
Hemoglobin: 14.7 g/dL (ref 12.0–15.0)
MCH: 29.1 pg (ref 26.0–34.0)
MCHC: 34.1 g/dL (ref 30.0–36.0)
MCV: 85.2 fL (ref 80.0–100.0)
Platelets: 183 K/uL (ref 150–400)
RBC: 5.06 MIL/uL (ref 3.87–5.11)
RDW: 13.1 % (ref 11.5–15.5)
WBC: 7 K/uL (ref 4.0–10.5)
nRBC: 0 % (ref 0.0–0.2)

## 2024-09-01 LAB — LIPID PANEL
Cholesterol: 165 mg/dL (ref 28–200)
HDL: 45.4 mg/dL
LDL Cholesterol: 91 mg/dL (ref 10–99)
NonHDL: 119.62
Total CHOL/HDL Ratio: 4
Triglycerides: 142 mg/dL (ref 10.0–149.0)
VLDL: 28.4 mg/dL (ref 0.0–40.0)

## 2024-09-01 LAB — CBC WITH DIFFERENTIAL/PLATELET
Basophils Absolute: 0.1 K/uL (ref 0.0–0.1)
Basophils Relative: 1.1 % (ref 0.0–3.0)
Eosinophils Absolute: 0.2 K/uL (ref 0.0–0.7)
Eosinophils Relative: 2.4 % (ref 0.0–5.0)
HCT: 43.5 % (ref 36.0–46.0)
Hemoglobin: 14.6 g/dL (ref 12.0–15.0)
Lymphocytes Relative: 58.6 % — ABNORMAL HIGH (ref 12.0–46.0)
Lymphs Abs: 4.2 K/uL — ABNORMAL HIGH (ref 0.7–4.0)
MCHC: 33.6 g/dL (ref 30.0–36.0)
MCV: 85.8 fl (ref 78.0–100.0)
Monocytes Absolute: 0.4 K/uL (ref 0.1–1.0)
Monocytes Relative: 6 % (ref 3.0–12.0)
Neutro Abs: 2.3 K/uL (ref 1.4–7.7)
Neutrophils Relative %: 31.9 % — ABNORMAL LOW (ref 43.0–77.0)
Platelets: 167 K/uL (ref 150.0–400.0)
RBC: 5.07 Mil/uL (ref 3.87–5.11)
RDW: 14 % (ref 11.5–15.5)
WBC: 7.1 K/uL (ref 4.0–10.5)

## 2024-09-01 LAB — MICROALBUMIN / CREATININE URINE RATIO
Creatinine,U: 189.8 mg/dL
Microalb Creat Ratio: 10.9 mg/g (ref 0.0–30.0)
Microalb, Ur: 2.1 mg/dL — ABNORMAL HIGH (ref 0.7–1.9)

## 2024-09-01 LAB — TSH: TSH: 2.35 u[IU]/mL (ref 0.35–5.50)

## 2024-09-01 LAB — LIPASE, BLOOD: Lipase: 66 U/L — ABNORMAL HIGH (ref 11–51)

## 2024-09-01 LAB — HEMOGLOBIN A1C: Hgb A1c MFr Bld: 5.7 % (ref 4.6–6.5)

## 2024-09-01 MED ORDER — ONDANSETRON 4 MG PO TBDP: 4.0000 mg | ORAL_TABLET | Freq: Three times a day (TID) | ORAL | 0 refills | Status: AC | PRN

## 2024-09-01 MED ORDER — IOHEXOL 300 MG/ML  SOLN
100.0000 mL | Freq: Once | INTRAMUSCULAR | Status: AC | PRN
  Administered 2024-09-01: 100 mL via INTRAVENOUS

## 2024-09-01 MED ORDER — FAMOTIDINE IN NACL 20-0.9 MG/50ML-% IV SOLN
20.0000 mg | Freq: Once | INTRAVENOUS | Status: AC
  Administered 2024-09-01: 20 mg via INTRAVENOUS
  Filled 2024-09-01: qty 50

## 2024-09-01 NOTE — Discharge Instructions (Addendum)
 Your recent emergency department for abdominal pain and nausea.  Continue to take your acid reflux medicine with Protonix .  This could be a side effect of your Ozempic .  The CT scan of your abdomen and pelvis did not show any findings to explain your pain today.  Your ultrasound of your gallbladder did not show any signs of gallstones.  Stay hydrated and drink plenty of fluids.  Follow-up closely with your primary care physician.  Return to the emergency department for any ongoing or worsening symptoms.  You had an incidental finding of hepatic steatosis which is a fatty liver, follow this up with your primary care physician.  zofran  (ondansetron ) - nausea medication, take 1 tablet every 8 hours as needed for nausea/vomiting.

## 2024-09-01 NOTE — ED Triage Notes (Signed)
 Pt comes with right sided pain since Friday. Pt states nausea but no vomiting.

## 2024-09-01 NOTE — Telephone Encounter (Signed)
 Agree with the ED advisement  Looks like she is in the ED now   Aware, will watch for correspondence

## 2024-09-01 NOTE — ED Provider Notes (Signed)
 "  [facility] Provider Note    Event Date/Time   First MD Initiated Contact with Patient 09/01/24 1503     (approximate)   History   Abdominal Pain   HPI  Teresa Mcgee is a 59 y.o. female presents to the emergency department for right-sided abdominal pain.  States that her pain started on Friday and has been a constant pain since that time.  Isolated to her right upper abdomen but also to the epigastric region.  States that she will intermittently have pain down her in her lower abdomen into a sharp stabbing pain that comes and goes.  Endorses some mild nausea but no episodes of vomiting.  No fever or chills.  Some mild burning with urination but no urinary urgency or frequency and no blood in the urine.  States that she is passing gas but has not had a significant bowel movement over the past couple of days.  Does take oxycodone  for her chronic back pain.  No history of kidney stone.  Prior hysterectomy.  History of known hiatal hernia and takes Protonix  on a daily basis.  No significant alcohol use.  No chest pain or shortness of breath.  No history of DVT or PE.     Physical Exam   Triage Vital Signs: ED Triage Vitals  Encounter Vitals Group     BP 09/01/24 1402 123/76     Girls Systolic BP Percentile --      Girls Diastolic BP Percentile --      Boys Systolic BP Percentile --      Boys Diastolic BP Percentile --      Pulse Rate 09/01/24 1402 77     Resp 09/01/24 1402 16     Temp 09/01/24 1402 98.4 F (36.9 C)     Temp Source 09/01/24 1402 Oral     SpO2 09/01/24 1402 94 %     Weight 09/01/24 1403 216 lb (98 kg)     Height 09/01/24 1403 5' 3 (1.6 m)     Head Circumference --      Peak Flow --      Pain Score 09/01/24 1404 8     Pain Loc --      Pain Education --      Exclude from Growth Chart --     Most recent vital signs: Vitals:   09/01/24 1845 09/01/24 1845  BP: 116/74   Pulse: 74   Resp: 16   Temp:  98.4 F (36.9 C)  SpO2: 100%      Physical Exam Constitutional:      Appearance: She is well-developed.  HENT:     Head: Atraumatic.  Eyes:     Extraocular Movements: Extraocular movements intact.     Conjunctiva/sclera: Conjunctivae normal.  Cardiovascular:     Rate and Rhythm: Regular rhythm.  Pulmonary:     Effort: No respiratory distress.  Abdominal:     General: There is no distension.     Tenderness: There is abdominal tenderness in the right upper quadrant, periumbilical area and suprapubic area. There is no right CVA tenderness or left CVA tenderness. Negative signs include Murphy's sign and McBurney's sign.  Musculoskeletal:        General: Normal range of motion.     Cervical back: Normal range of motion.  Skin:    General: Skin is warm.  Neurological:     Mental Status: She is alert. Mental status is at baseline.  IMPRESSION / MDM / ASSESSMENT AND PLAN / ED COURSE  I reviewed the triage vital signs and the nursing notes.  Differential diagnosis including acute cholecystitis, symptomatic cholelithiasis, gastritis/PUD, kidney stone, pyelonephritis, urinary tract infection, small bowel obstruction, constipation, ileus.  Lower suspicion for acute appendicitis, no right lower quadrant tenderness to palpation.  No rebound or guarding.  Low suspicion for pulmonary embolism, no chest pain or shortness of breath and no pleuritic nature of her pain.   Offered pain medication but patient declined at this time  RADIOLOGY I independently reviewed imaging, my interpretation of imaging: CT scan abdomen and pelvis with contrast -no acute findings  Right upper quadrant ultrasound with no findings of acute cholecystitis or symptomatic cholelithiasis.  Hepatic steatosis which was discussed with the patient.  LABS (all labs ordered are listed, but only abnormal results are displayed) Labs interpreted as -    Labs Reviewed  LIPASE, BLOOD - Abnormal; Notable for the following components:      Result Value    Lipase 66 (*)    All other components within normal limits  COMPREHENSIVE METABOLIC PANEL WITH GFR - Abnormal; Notable for the following components:   Potassium 3.3 (*)    Glucose, Bld 112 (*)    Creatinine, Ser 1.02 (*)    All other components within normal limits  URINALYSIS, ROUTINE W REFLEX MICROSCOPIC - Abnormal; Notable for the following components:   Color, Urine YELLOW (*)    APPearance CLEAR (*)    Hgb urine dipstick MODERATE (*)    Bacteria, UA RARE (*)    All other components within normal limits  CBC     MDM  No significant leukocytosis or anemia.  Creatinine appears to be likely at her baseline at 1.0 but no recent to compare.  Normal BUN.  Potassium only mildly low at 3.3.  Lipase mildly elevated at 66 but does not meet criteria for acute pancreatitis.  No findings of a urinary tract infection, hemoglobin dipstick is moderate but no RBCs in the urine..  Normal LFTs and T. bili.   Given IV Protonix .  On reevaluation is feeling improvement of her symptoms.  Possible medication side effects.  Discussed close follow-up as an outpatient with primary care physician discussed return precautions.  No questions or concerns at time of discharge.     PROCEDURES:  Critical Care performed: No  Procedures  Patient's presentation is most consistent with acute presentation with potential threat to life or bodily function.   MEDICATIONS ORDERED IN ED: Medications  iohexol  (OMNIPAQUE ) 300 MG/ML solution 100 mL (100 mLs Intravenous Contrast Given 09/01/24 1617)  famotidine  (PEPCID ) IVPB 20 mg premix (20 mg Intravenous New Bag/Given 09/01/24 1843)    FINAL CLINICAL IMPRESSION(S) / ED DIAGNOSES   Final diagnoses:  Right upper quadrant abdominal pain  Hepatic steatosis     Rx / DC Orders   ED Discharge Orders          Ordered    ondansetron  (ZOFRAN -ODT) 4 MG disintegrating tablet  Every 8 hours PRN        09/01/24 1902             Note:  This document was  prepared using Dragon voice recognition software and may include unintentional dictation errors.   Suzanne Kirsch, MD 09/01/24 1916  "

## 2024-09-01 NOTE — Telephone Encounter (Signed)
 FYI Only or Action Required?: FYI only for provider: ED advised.  Patient was last seen in primary care on 06/04/2024 by [name], MD.  Called Nurse Triage reporting Abdominal Pain.  Symptoms began several days ago.  Interventions attempted: OTC medications: indigestion medication and Rest, hydration, or home remedies.  Symptoms are: gradually worsening.  Triage Disposition: Go to ED Now (Notify PCP)  Patient/caregiver understands and will follow disposition?: Yes     Message from Taleah C sent at 09/01/2024  1:08 PM EST  Reason for Triage: pain in stomach and on right side of body, severe. Indigestion   Reason for Disposition  [1] SEVERE pain (e.g., excruciating) AND [2] present > 1 hour  Answer Assessment - Initial Assessment Questions 1. LOCATION: Where does it hurt?      R) sided lower abdomen- below navel; indigestion medications sometimes help  2. RADIATION: Does the pain shoot anywhere else? (e.g., chest, back)     Stays in localized area, does not radiate  3. ONSET: When did the pain begin? (e.g., minutes, hours or days ago)      Friday   5. PATTERN Does the pain come and go, or is it constant?     Intermittent  6. SEVERITY: How bad is the pain?  (e.g., Scale 1-10; mild, moderate, or severe)     Severe at times, currently standing during call with pain level is 6/10, when laying flat it really hurts, up to 7/10  7. RECURRENT SYMPTOM: Have you ever had this type of stomach pain before? If Yes, ask: When was the last time? and What happened that time?      Denies  8. CAUSE: What do you think is causing the stomach pain? (e.g., gallstones, recent abdominal surgery)     Unsure, reports last BM was a day before yesterday and hasn't had bowel movement for a while; was a hard stool, no bloody or black tarry stools; has had a hysterectomy and c-section x3.   9. RELIEVING/AGGRAVATING FACTORS: What makes it better or worse? (e.g., antacids,  bending or twisting motion, bowel movement)     Standing upright and indigestion medications help.  Laying flat makes worse.   10. OTHER SYMPTOMS: Do you have any other symptoms? (e.g., back pain, diarrhea, fever, urination pain, vomiting)       Denie fever or chills.  Reports nausea, no vomiting. Has appendix  Upcoming appointment on January 26th but uncertain if can wait that long, pain increasing.  Protocols used: Abdominal Pain - Female-A-AH

## 2024-09-01 NOTE — ED Notes (Signed)
 [place] bedside

## 2024-09-07 ENCOUNTER — Encounter: Payer: Self-pay | Admitting: Family Medicine

## 2024-09-07 DIAGNOSIS — G720 Drug-induced myopathy: Secondary | ICD-10-CM | POA: Insufficient documentation

## 2024-09-08 ENCOUNTER — Ambulatory Visit: Admitting: Podiatry

## 2024-09-08 ENCOUNTER — Encounter: Admitting: Family Medicine

## 2024-09-09 ENCOUNTER — Ambulatory Visit: Payer: Self-pay

## 2024-09-09 NOTE — Telephone Encounter (Signed)
 FYI Only or Action Required?: FYI only for provider: appointment scheduled on Hospital follow up 09/12/24, AWV 09/17/24.  Patient was last seen in primary care on 06/04/2024 by Teresa Mirza, MD.  Called Nurse Triage reporting Constipation.  Symptoms began several days ago.  Interventions attempted: OTC medications: Colace and hydration  Symptoms are: rapidly improving.  Triage Disposition: See PCP When Office is Open (Within 3 Days)  Patient/caregiver understands and will follow disposition?: Yes   Reason for Disposition  Unable to have a bowel movement (BM) without laxative or enema  Answer Assessment - Initial Assessment Questions Patient calling in to schedule for hospital follow up, reschedule AWV, and triage for constipation.  Patient reports she has been dealing with constipation for several days, she took colace last evening and had one hard stool this morning that caused her to strain to pass the stool, prior BM was on 09/05/24. She has intermittent low abdominal pain when laying flat. Persisting pain, not worsening,  in right upper quadrant was evaluated at [facility] on 09/01/24-she received antiemetics and discharged. Denies fever, urinary symptoms, radiating pain, and all other symptoms.  Refused offered acute appointment today-ED/UC/return call precautions discussed.  Scheduled hospital follow up on preferred date of 09/12/24.  Scheduled AWV on preferred date of 09/17/24.    1. STOOL PATTERN OR FREQUENCY: How often do you have a bowel movement (BM)?  (Normal range: 3 times a day to every 3 days)  When was your last BM?       Last BM this morning prior had been on Friday 4 days 2. STRAINING: Do you have to strain to have a BM?      yes 3. ONSET: When did the constipation begin?     09/05/24 4. RECTAL PAIN: Does your rectum hurt when the stool comes out? If Yes, ask: Do you have hemorrhoids? How bad is the pain?  (Scale 1-10; or mild, moderate, severe)       5. BM COMPOSITION: Are the stools hard?      firm 6. BLOOD ON STOOLS: Has there been any blood on the toilet tissue or on the surface of the BM? If Yes, ask: When was the last time?     denies 7. CHRONIC CONSTIPATION: Is this a new problem for you?  If No, ask: How long have you had this problem? (days, weeks, months)      no 8. CHANGES IN DIET OR HYDRATION: Have there been any recent changes in your diet? How much fluids are you drinking on a daily basis?  How much have you had to drink today?      9. MEDICINES: Have you been taking any new medicines? Are you taking any narcotic pain medicines? (e.g., Dilaudid, morphine , Percocet, Vicodin)      10. LAXATIVES: Have you been using any stool softeners, laxatives, or enemas?  If Yes, ask What are you using, how often, and when was the last time?       colace 11. ACTIVITY:  How much walking do you do every day?  Has your activity level decreased in the past week?         12. CAUSE: What do you think is causing the constipation?        unsure 13. MEDICAL HISTORY: Do you have a history of hemorrhoids, rectal fissures, rectal surgery, or rectal abscess?          14. OTHER SYMPTOMS: Do you have any other symptoms? (e.g., abdomen pain, bloating, fever,  vomiting)       Gas and bloating is resolving, abdominal pain when laying flat  Protocols used: Constipation-A-AH Message from Teresa Mcgee B sent at 09/09/2024 11:18 AM EST  Reason for Triage: Constipation. Trouble going to the bathroom. Needs to r/s awv as well.

## 2024-09-09 NOTE — Telephone Encounter (Signed)
 In addition to colace, I recommend miralax to use daily or more often  It take several days to start working but can get her in a better pattern  Also make sure you eat enough fluids and eat high fiber foods   Agree with ER/ UC precautions

## 2024-09-10 ENCOUNTER — Ambulatory Visit: Payer: Medicare HMO

## 2024-09-10 NOTE — Telephone Encounter (Signed)
 Left VM requesting pt to call the office back   **[id number] okay to relay Dr. Graham message if pt calls back**

## 2024-09-12 ENCOUNTER — Ambulatory Visit: Admitting: Family Medicine

## 2024-09-12 ENCOUNTER — Ambulatory Visit: Payer: Self-pay | Admitting: Family Medicine

## 2024-09-12 ENCOUNTER — Ambulatory Visit
Admission: RE | Admit: 2024-09-12 | Discharge: 2024-09-12 | Disposition: A | Source: Ambulatory Visit | Attending: Family Medicine | Admitting: Family Medicine

## 2024-09-12 ENCOUNTER — Encounter: Payer: Self-pay | Admitting: Family Medicine

## 2024-09-12 VITALS — BP 132/80 | HR 70 | Temp 97.9°F | Ht 63.0 in | Wt 213.2 lb

## 2024-09-12 DIAGNOSIS — K760 Fatty (change of) liver, not elsewhere classified: Secondary | ICD-10-CM

## 2024-09-12 DIAGNOSIS — I10 Essential (primary) hypertension: Secondary | ICD-10-CM | POA: Diagnosis not present

## 2024-09-12 DIAGNOSIS — R1011 Right upper quadrant pain: Secondary | ICD-10-CM | POA: Diagnosis not present

## 2024-09-12 DIAGNOSIS — K5903 Drug induced constipation: Secondary | ICD-10-CM | POA: Diagnosis not present

## 2024-09-12 DIAGNOSIS — S99922A Unspecified injury of left foot, initial encounter: Secondary | ICD-10-CM

## 2024-09-12 DIAGNOSIS — E876 Hypokalemia: Secondary | ICD-10-CM | POA: Diagnosis not present

## 2024-09-12 DIAGNOSIS — Z6837 Body mass index (BMI) 37.0-37.9, adult: Secondary | ICD-10-CM

## 2024-09-12 LAB — BASIC METABOLIC PANEL WITH GFR
BUN: 8 mg/dL (ref 6–23)
CO2: 34 meq/L — ABNORMAL HIGH (ref 19–32)
Calcium: 9.9 mg/dL (ref 8.4–10.5)
Chloride: 100 meq/L (ref 96–112)
Creatinine, Ser: 0.86 mg/dL (ref 0.40–1.20)
GFR: 74.62 mL/min
Glucose, Bld: 73 mg/dL (ref 70–99)
Potassium: 3.4 meq/L — ABNORMAL LOW (ref 3.5–5.1)
Sodium: 141 meq/L (ref 135–145)

## 2024-09-12 NOTE — Assessment & Plan Note (Signed)
 On glp-1 Encouraged to use miralax daily with lots of fluids/fiber in addition to stool softener if it helps

## 2024-09-12 NOTE — Assessment & Plan Note (Signed)
 Lab Results  Component Value Date   K 3.3 (L) 09/01/2024   Pharm ran out of K -was w/o it for a bit Now back on it  Re check today Orders:   Basic metabolic panel with GFR

## 2024-09-12 NOTE — Progress Notes (Signed)
 "  Subjective:    Patient ID: Teresa Mcgee, female    DOB: Feb 04, 1966, 59 y.o.   MRN: [id number]  HPI  Wt Readings from Last 3 Encounters:  09/12/24 213 lb 4 oz (96.7 kg)  09/01/24 216 lb (98 kg)  08/21/24 216 lb (98 kg)   37.78 kg/m  Vitals:   09/12/24 1053  BP: 132/80  Pulse: 70  Temp: 97.9 F (36.6 C)  SpO2: 99%    Pt presents for follow up of  ED visit 09/01/24 for right sided abdominal pain  Also constipation  Toe injury  RUQ -to epigastric pain  Mild nausea /no vomiting  Mild dysuria    Work up No significant leukocytosis or anemia.  Creatinine appears to be likely at her baseline at 1.0 but no recent to compare.  Normal BUN.  Potassium only mildly low at 3.3.  Lipase mildly elevated at 66 but does not meet criteria for acute pancreatitis.  No findings of a urinary tract infection, hemoglobin dipstick is moderate but no RBCs in the urine..  Normal LFTs and T. bili.   Was treated with IV famotidine   and this improved symptoms  Was prescribed zofran    Continues pantoprazole  40 mg daily   Meloxicam  is on med list    US  RUQ US  ABDOMEN LIMITED RUQ (LIVER/GB) Result Date: 09/01/2024 CLINICAL DATA:  Right upper quadrant pain. EXAM: ULTRASOUND ABDOMEN LIMITED RIGHT UPPER QUADRANT COMPARISON:  February 02, 2022 FINDINGS: Gallbladder: No gallstones or wall thickening visualized (2.0 mm). No sonographic Murphy sign noted by sonographer. Common bile duct: Diameter: 1.9 mm Liver: No focal lesion identified. The liver parenchyma is coarse in echotexture and diffusely increased in echogenicity. Portal vein is patent on color Doppler imaging with normal direction of blood flow towards the liver. Other: The study is technically limited secondary to the patient's body habitus and inability to hold her breath, as per the ultrasound technologist. IMPRESSION: 1. No evidence of cholelithiasis or acute cholecystitis. 2. Hepatic steatosis. Electronically Signed   By: Suzen Dials  M.D.   On: 09/01/2024 19:11   CT ABDOMEN PELVIS W CONTRAST Result Date: 09/01/2024 EXAM: CT ABDOMEN AND PELVIS WITH CONTRAST 09/01/2024 04:23:09 PM TECHNIQUE: CT of the abdomen and pelvis was performed with the administration of 100 mL of iohexol  (OMNIPAQUE ) 300 MG/ML solution. Multiplanar reformatted images are provided for review. Automated exposure control, iterative reconstruction, and/or weight-based adjustment of the mA/kV was utilized to reduce the radiation dose to as low as reasonably achievable. COMPARISON: CT abdomen and pelvis 05/02/2014. CLINICAL HISTORY: Abdominal pain, acute, nonlocalized; Right sided, periumbilical. FINDINGS: LOWER CHEST: There is a small hiatal hernia. LIVER: The liver is unremarkable. GALLBLADDER AND BILE DUCTS: Gallbladder is unremarkable. No biliary ductal dilatation. SPLEEN: No acute abnormality. PANCREAS: No acute abnormality. ADRENAL GLANDS: No acute abnormality. KIDNEYS, URETERS AND BLADDER: Right renal cysts are present measuring up to 1.5 cm. Per consensus, no follow-up is needed for simple Bosniak type 1 and 2 renal cysts, unless the patient has a malignancy history or risk factors. No stones in the kidneys or ureters. No hydronephrosis. No perinephric or periureteral stranding. Urinary bladder is unremarkable. GI AND BOWEL: Stomach demonstrates no acute abnormality. Appendix is within normal limits. There is no bowel obstruction. PERITONEUM AND RETROPERITONEUM: No ascites. No free air. VASCULATURE: Aorta is normal in caliber. There are atherosclerotic calcifications of the aorta and iliac arteries. LYMPH NODES: No lymphadenopathy. REPRODUCTIVE ORGANS: Uterus is surgically absent. BONES AND SOFT TISSUES: No acute osseous abnormality. No  focal soft tissue abnormality. IMPRESSION: 1. No acute findings in the abdomen or pelvis. 2. Right renal cysts, no follow-up imaging recommended. 3. Aortoiliac atherosclerotic calcifications. Electronically signed by: Greig Pique MD  09/01/2024 04:48 PM EST RP Workstation: [id number]    Lab Results  Component Value Date   NA 141 09/01/2024   K 3.3 (L) 09/01/2024   CO2 27 09/01/2024   GLUCOSE 112 (H) 09/01/2024   BUN 10 09/01/2024   CREATININE 1.02 (H) 09/01/2024   CALCIUM  9.5 09/01/2024   GFR 65.41 09/01/2024   GFRNONAA >60 09/01/2024   Takes klor con 10 meq daily- her drug store was out of it  Back on it now    Lab Results  Component Value Date   ALT 20 09/01/2024   AST 32 09/01/2024   ALKPHOS 99 09/01/2024   BILITOT 0.4 09/01/2024     Fibrosis 4 Score = 2.27 (Indeterminate)       Interpretation for patients with NAFLD          <1.30       -  F0-F1 (Low risk)          1.30-2.67 -  Indeterminate           >2.67      -  F3-F4 (High risk)     Validated for ages 66-65 Sees hepatology       Lab Results  Component Value Date   WBC 7.0 09/01/2024   HGB 14.7 09/01/2024   HCT 43.1 09/01/2024   MCV 85.2 09/01/2024   PLT 183 09/01/2024   Lab Results  Component Value Date   TSH 2.35 09/01/2024   Lab Results  Component Value Date   LIPASE 66 (H) 09/01/2024     Lab Results  Component Value Date   HGBA1C 5.7 09/01/2024   HGBA1C 5.8 (A) 01/03/2024   HGBA1C 6.4 (A) 09/10/2023   Taking glp-1 for DM2 Semaglutide  2 mg weekly  Metformin  xr 500 mg daily    Now She has right sided abdominal pain when she lies down  Is fine when up   Still eating very healthy  Vegetables   No heartburn  No more nausea    Also stubbed her toe , 5th , left foot - hurts a lot  Used muscle rub     Patient Active Problem List   Diagnosis Date Noted   RUQ pain 09/12/2024   Drug-induced myopathy 09/07/2024   Pain of shoulder girdle 04/08/2024   Bilateral shoulder pain 04/08/2024   Plantar fasciitis of left foot 01/15/2024   Elevated antinuclear antibody (ANA) level 05/28/2023   Morbid obesity (HCC) 05/28/2023   Hypomagnesemia 02/21/2022   Hypokalemia 02/21/2022   Fatty liver 02/06/2022   Allergic  conjunctivitis 12/01/2021   Elevated LFTs 11/22/2021   Medication monitoring encounter 11/22/2021   Fungal dermatitis 11/22/2021   Routine general medical examination at a health care facility 08/19/2021   Encounter for screening mammogram for breast cancer 08/19/2021   Chronic use of opiate for therapeutic purpose 11/04/2020   Uncomplicated opioid dependence (HCC) 11/04/2020   Diabetes mellitus treated with injections of non-insulin medication (HCC) 03/30/2019   Pain due to onychomycosis of toenails of both feet 03/03/2019   Cervicalgia 04/22/2018   Chronic musculoskeletal pain 04/22/2018   DDD (degenerative disc disease), thoracic 04/22/2018   DDD (degenerative disc disease), cervical 04/22/2018   Lumbar facet arthropathy (Bilateral) 04/22/2018   Lumbar facet syndrome (Bilateral) 04/22/2018   Chronic low back pain (1ry area of Pain) (  Bilateral) (L>R) w/o sciatica 04/22/2018   Spondylosis without myelopathy or radiculopathy, cervical region 04/22/2018   Spondylosis without myelopathy or radiculopathy, lumbosacral region 04/22/2018   DDD (degenerative disc disease), lumbosacral 04/22/2018   Strain of lumbar paraspinal muscle, sequela 04/22/2018   Chronic hip pain (Bilateral) (R>L) 04/22/2018   Chronic sacroiliac joint pain (Left) 04/22/2018   Neurogenic pain 04/22/2018   Osteoarthritis involving multiple joints 04/22/2018   Chronic knee pain (Bilateral) (R>L) 04/22/2018   Chronic upper extremity pain (4th area of Pain) (Bilateral) (L>R) 02/27/2018   Vitamin D  deficiency 02/27/2018   Chronic low back pain (Bilateral) (L>R) w/ sciatica (Bilateral) 01/24/2018   Chronic lower extremity pain (2ry area of Pain) (Bilateral) (L>R) 01/24/2018   Chronic neck pain (3ry area of Pain) (Bilateral) (L>R) 01/24/2018   Chronic pain syndrome 01/24/2018   Long term current use of opiate analgesic 01/24/2018   Disorder of skeletal system 01/24/2018   Constipation 01/11/2012   Chest pain 01/11/2012    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   Numbness and tingling in both hands 08/25/2011   Tobacco abuse 05/24/2010   Hypothyroidism 01/25/2010   HYPERTENSION, BENIGN ESSENTIAL 01/25/2010   Hyperlipidemia associated with type 2 diabetes mellitus (HCC) 12/31/2009   DEPRESSION 12/31/2009   GERD 12/31/2009   OSA (obstructive sleep apnea) 12/31/2009   Past Medical History:  Diagnosis Date   Arthritis    Bell's palsy may 2012   Chest pain    Diabetes mellitus without complication (HCC)    GERD (gastroesophageal reflux disease)    H/O hiatal hernia    Hyperlipidemia    Hypertension    Hypothyroidism    Shingles    Shortness of breath    Sleep apnea    uses cpap   Past Surgical History:  Procedure Laterality Date   ABDOMINAL HYSTERECTOMY     CESAREAN SECTION     cesarian     3x   COLONOSCOPY WITH PROPOFOL  N/A 11/27/2019   Procedure: COLONOSCOPY WITH PROPOFOL ;  Surgeon: Therisa Bi, MD;  Location: [facility] ENDOSCOPY;  Service: Gastroenterology;  Laterality: N/A;   ESOPHAGOGASTRODUODENOSCOPY (EGD) WITH PROPOFOL  N/A 07/20/2020   Procedure: ESOPHAGOGASTRODUODENOSCOPY (EGD) WITH PROPOFOL ;  Surgeon: Therisa Bi, MD;  Location: [facility] ENDOSCOPY;  Service: Gastroenterology;  Laterality: N/A;   PARTIAL HYSTERECTOMY  2000   abnormal uterine bleeding   THYROIDECTOMY  1998   TOOTH EXTRACTION     TUBAL LIGATION     Social History[1] Family History  Problem Relation Age of Onset   Heart attack Father 66       deceased   Diabetes Brother    Stroke Brother 25       twice   Stroke Sister 45   Diabetes Sister    Heart attack Sister 59   Heart attack Brother 39   Breast cancer Maternal Aunt    Allergies[2] Medications Ordered Prior to Encounter[3]  Review of Systems  Constitutional:  Negative for activity change, appetite change, fatigue, fever and unexpected weight change.  HENT:  Negative for congestion, ear pain, rhinorrhea, sinus pressure and sore throat.   Eyes:  Negative for pain, redness and  visual disturbance.  Respiratory:  Negative for cough, shortness of breath and wheezing.   Cardiovascular:  Negative for chest pain and palpitations.  Gastrointestinal:  Negative for abdominal distention, abdominal pain, blood in stool, constipation, diarrhea, nausea and vomiting.       Right flank area hurts when lying down Better when up   Endocrine: Negative for polydipsia and polyuria.  Genitourinary:  Negative for dysuria, frequency and urgency.  Musculoskeletal:  Positive for arthralgias. Negative for back pain and myalgias.       Left 5th toe injury  Skin:  Negative for pallor and rash.  Allergic/Immunologic: Negative for environmental allergies.  Neurological:  Negative for dizziness, syncope and headaches.  Hematological:  Negative for adenopathy. Does not bruise/bleed easily.  Psychiatric/Behavioral:  Negative for decreased concentration and dysphoric mood. The patient is not nervous/anxious.        Objective:   Physical Exam Constitutional:      General: She is not in acute distress.    Appearance: Normal appearance. She is well-developed. She is obese.  HENT:     Head: Normocephalic and atraumatic.  Eyes:     Conjunctiva/sclera: Conjunctivae normal.     Pupils: Pupils are equal, round, and reactive to light.  Neck:     Thyroid : No thyromegaly.     Vascular: No carotid bruit or JVD.  Cardiovascular:     Rate and Rhythm: Normal rate and regular rhythm.     Heart sounds: Normal heart sounds.     No gallop.  Pulmonary:     Effort: Pulmonary effort is normal. No respiratory distress.     Breath sounds: Normal breath sounds. No stridor. No wheezing, rhonchi or rales.  Chest:     Chest wall: No tenderness.  Abdominal:     General: Abdomen is protuberant. Bowel sounds are normal. There is no distension or abdominal bruit.     Palpations: Abdomen is soft. There is no shifting dullness, fluid wave, hepatomegaly, splenomegaly, mass or pulsatile mass.     Tenderness: There  is no abdominal tenderness.  Musculoskeletal:     Cervical back: Normal range of motion and neck supple.     Right lower leg: No edema.     Left lower leg: No edema.     Comments: Left 5th toe is tender and mildly swollen Bruised  Able to move it with discomfort No crepitus   Mild muscular tenderness over right far lateral waist/abdomen No rash   Lymphadenopathy:     Cervical: No cervical adenopathy.  Skin:    General: Skin is warm and dry.     Coloration: Skin is not pale.     Findings: No rash.  Neurological:     Mental Status: She is alert.     Coordination: Coordination normal.     Deep Tendon Reflexes: Reflexes are normal and symmetric. Reflexes normal.  Psychiatric:        Mood and Affect: Mood normal.           Assessment & Plan:   Assessment & Plan HYPERTENSION, BENIGN ESSENTIAL bp in fair control at this time  BP Readings from Last 1 Encounters:  09/12/24 132/80   No changes needed Most recent labs reviewed  Disc lifstyle change with low sodium diet and exercise  Plan to continue  amlodiine 10 mg daily  hctz 25 mg daily        Fatty liver Lab Results  Component Value Date   ALT 20 09/01/2024   AST 32 09/01/2024   ALKPHOS 99 09/01/2024   BILITOT 0.4 09/01/2024   Sees hepatology Recent us  noted fatty liver as well    Fibrosis 4 Score = 2.27 (Indeterminate)       Interpretation for patients with NAFLD          <1.30       -  F0-F1 (Low risk)  1.30-2.67 -  Indeterminate           >2.67      -  F3-F4 (High risk)     Validated for ages 17-65       Working on weight loss On glp-1    Drug-induced constipation On glp-1 Encouraged to use miralax daily with lots of fluids/fiber in addition to stool softener if it helps     RUQ pain Seen in ER Reviewed hospital records, lab results and studies in detail  Reassuring imaging and labs  Now side pain-only if lying down, may be MSK Encouraged trial of gentle heat  Will update if no  further improvement or worse     Hypokalemia Lab Results  Component Value Date   K 3.3 (L) 09/01/2024   Pharm ran out of K -was w/o it for a bit Now back on it  Re check today Orders:   Basic metabolic panel with GFR  Morbid obesity (HCC) With DM and fatty liver  On glp-1 Eating well  Encouraged exercise as tolerated     Toe injury, left, initial encounter Suspect left 5th toe fracture after stubbing injury Xray pending   Encouraged use of ice Buddy tape Supportive shoe  Call back and Er precautions noted in detail today   Orders:   DG Foot Complete Left       [1]  Social History Tobacco Use   Smoking status: Every Day    Current packs/day: 0.50    Average packs/day: 0.5 packs/day for 20.0 years (10.0 ttl pk-yrs)    Types: Cigarettes   Smokeless tobacco: Never   Tobacco comments:    11/13/2023 Patient smokes 1/2 pack cigarettes daily    2-3 cigs daily-09/22/2020  Vaping Use   Vaping status: Never Used  Substance Use Topics   Alcohol use: No   Drug use: No  [2]  Allergies Allergen Reactions   Statins     myopathy  [3]  Current Outpatient Medications on File Prior to Visit  Medication Sig Dispense Refill   Accu-Chek FastClix Lancets MISC USE UP TO 4 TIMES DAILY AS DIRECTED 102 each 1   ACCU-CHEK GUIDE test strip USE UP TO 4 TIMES DAILY AS DIRECTED 100 strip 5   albuterol  (VENTOLIN  HFA) 108 (90 Base) MCG/ACT inhaler INHALE 2 PUFFS INTO THE LUNGS EVERY 4 HOURS AS NEEDED FOR (COUGH, SHORTNESS OF BREATH OR WHEEZING) 8.5 each 2   amLODipine  (NORVASC ) 10 MG tablet TAKE 1 TABLET BY MOUTH EVERY DAY 90 tablet 0   azelastine  (OPTIVAR ) 0.05 % ophthalmic solution Place 1 drop into both eyes 2 (two) times daily as needed. 6 mL 3   blood glucose meter kit and supplies Dispense based on patient and insurance preference. Use up to four times daily as directed. (FOR ICD-10 E10.9, E11.9). 1 each 0   [employer] D3 125 MCG (5000 UT) capsule TAKE 1 CAPSULE BY MOUTH DAILY WITH  BREAKFAST. TAKE ALONG WITH CALCIUM  AND MAGNESIUM .  5   Evolocumab  (REPATHA  SURECLICK) 140 MG/ML SOAJ INJECT 140 MG INTO THE SKIN EVERY 14 (FOURTEEN) DAYS. 6 mL 3   ezetimibe  (ZETIA ) 10 MG tablet TAKE 1 TABLET BY MOUTH EVERY DAY 90 tablet 0   fluticasone  (FLONASE ) 50 MCG/ACT nasal spray SPRAY 2 SPRAYS INTO EACH NOSTRIL EVERY DAY 48 mL 1   gabapentin  (NEURONTIN ) 100 MG capsule TAKE 1 CAPSULE (100 MG TOTAL) BY MOUTH 2 (TWO) TIMES DAILY AND 3 CAPSULES (300 MG TOTAL) AT BEDTIME. 150 capsule 3  Glucose Blood (BLOOD GLUCOSE TEST STRIPS) STRP 1 each by In Vitro route daily. May substitute to any manufacturer covered by patient's insurance. 100 strip 0   hydrochlorothiazide  (HYDRODIURIL ) 25 MG tablet TAKE 1 TABLET (25 MG TOTAL) BY MOUTH DAILY. AS DIRECTED 90 tablet 1   levocetirizine (XYZAL ) 5 MG tablet TAKE 1 TABLET BY MOUTH EVERY DAY IN THE EVENING 90 tablet 0   levothyroxine  (SYNTHROID ) 112 MCG tablet TAKE 1 TABLET BY MOUTH DAILY BEFORE BREAKFAST. 90 tablet 0   Magnesium  Oxide -Mg Supplement 500 MG CAPS TAKE 1 CAPSULE BY MOUTH TWICE A DAY (AT 8AM & 10PM) 180 capsule 0   metFORMIN  (GLUCOPHAGE -XR) 500 MG 24 hr tablet TAKE 1 TABLET BY MOUTH 2 TIMES DAILY WITH A MEAL. 180 tablet 0   Olopatadine HCl 0.2 % SOLN INSTILL 1 DROP INTO BOTH EYES EVERY DAY AS NEEDED     ondansetron  (ZOFRAN -ODT) 4 MG disintegrating tablet Take 1 tablet (4 mg total) by mouth every 8 (eight) hours as needed for nausea or vomiting. 30 tablet 0   oxyCODONE  (OXY IR/ROXICODONE ) 5 MG immediate release tablet Take 1 tablet (5 mg total) by mouth every 8 (eight) hours as needed for severe pain (pain score 7-10). Must last 30 days 90 tablet 0   [START ON 09/23/2024] oxyCODONE  (OXY IR/ROXICODONE ) 5 MG immediate release tablet Take 1 tablet (5 mg total) by mouth every 8 (eight) hours as needed for severe pain (pain score 7-10). Must last 30 days 90 tablet 0   [START ON 10/23/2024] oxyCODONE  (OXY IR/ROXICODONE ) 5 MG immediate release tablet Take 1  tablet (5 mg total) by mouth every 8 (eight) hours as needed for severe pain (pain score 7-10). Must last 30 days 90 tablet 0   pantoprazole  (PROTONIX ) 40 MG tablet TAKE 1 TABLET BY MOUTH EVERY DAY 90 tablet 0   potassium chloride  (KLOR-CON ) 10 MEQ tablet TAKE 1 TABLET BY MOUTH EVERY DAY 90 tablet 1   REFRESH CELLUVISC 1 % GEL APPLY 1 DROP TO EYE 3 (THREE) TIMES DAILY.  12   Semaglutide , 2 MG/DOSE, (OZEMPIC , 2 MG/DOSE,) 8 MG/3ML SOPN INJECT 2 MG AS DIRECTED ONCE A WEEK. 3 mL 2   tiZANidine  (ZANAFLEX ) 4 MG tablet TAKE 1 TABLET (4 MG TOTAL) BY MOUTH EVERY 8 (EIGHT) HOURS AS NEEDED FOR MUSCLE SPASMS 270 tablet 1   No current facility-administered medications on file prior to visit.   "

## 2024-09-12 NOTE — Assessment & Plan Note (Signed)
 Suspect left 5th toe fracture after stubbing injury Xray pending   Encouraged use of ice Buddy tape Supportive shoe  Call back and Er precautions noted in detail today   Orders:   DG Foot Complete Left

## 2024-09-12 NOTE — Assessment & Plan Note (Signed)
 bp in fair control at this time  BP Readings from Last 1 Encounters:  09/12/24 132/80   No changes needed Most recent labs reviewed  Disc lifstyle change with low sodium diet and exercise  Plan to continue  amlodiine 10 mg daily  hctz 25 mg daily

## 2024-09-12 NOTE — Patient Instructions (Addendum)
 Stop your meloxicam  It can bother your stomach   Use miralax daily in addition to stool softener and make sure to drink fluids   Xray of foot today to see if your left pinkie toe is broken  We will reach out with results  Start using a cold compress for 10 minutes whenever you can  Elevate when sitting  Wear a supportive shoe You can tape that toe to the next toe for stability    Avoid foods that give you heartburn or bother your stomach  We will re check potassium today   If the right side pain worsens let us  know  Gentle heat for 10 minutes may help

## 2024-09-12 NOTE — Assessment & Plan Note (Signed)
 Seen in ER Reviewed hospital records, lab results and studies in detail  Reassuring imaging and labs  Now side pain-only if lying down, may be MSK Encouraged trial of gentle heat  Will update if no further improvement or worse

## 2024-09-12 NOTE — Assessment & Plan Note (Signed)
 With DM and fatty liver  On glp-1 Eating well  Encouraged exercise as tolerated

## 2024-09-12 NOTE — Assessment & Plan Note (Signed)
 Lab Results  Component Value Date   ALT 20 09/01/2024   AST 32 09/01/2024   ALKPHOS 99 09/01/2024   BILITOT 0.4 09/01/2024   Sees hepatology Recent us  noted fatty liver as well    Fibrosis 4 Score = 2.27 (Indeterminate)       Interpretation for patients with NAFLD          <1.30       -  F0-F1 (Low risk)          1.30-2.67 -  Indeterminate           >2.67      -  F3-F4 (High risk)     Validated for ages 102-65       Working on weight loss On glp-1

## 2024-09-12 NOTE — Telephone Encounter (Signed)
 Pt never returned our call but has f/u scheduled today with PCP

## 2024-09-14 ENCOUNTER — Other Ambulatory Visit: Payer: Self-pay | Admitting: Family Medicine

## 2024-09-17 ENCOUNTER — Ambulatory Visit

## 2024-09-19 ENCOUNTER — Encounter: Admitting: Family Medicine

## 2024-10-21 ENCOUNTER — Encounter: Admitting: Family Medicine

## 2024-11-06 ENCOUNTER — Ambulatory Visit: Admitting: Podiatry

## 2024-11-17 ENCOUNTER — Encounter: Admitting: Nurse Practitioner

## 2024-12-04 ENCOUNTER — Ambulatory Visit
